# Patient Record
Sex: Female | Born: 1940
Health system: Southern US, Community
[De-identification: ages and names within clinical notes are randomized; demographics above are authoritative.]

## PROBLEM LIST (undated history)

## (undated) DIAGNOSIS — Z992 Dependence on renal dialysis: Secondary | ICD-10-CM

## (undated) DIAGNOSIS — I10 Essential (primary) hypertension: Secondary | ICD-10-CM

## (undated) DIAGNOSIS — R413 Other amnesia: Secondary | ICD-10-CM

## (undated) DIAGNOSIS — N186 End stage renal disease: Secondary | ICD-10-CM

## (undated) DIAGNOSIS — E559 Vitamin D deficiency, unspecified: Secondary | ICD-10-CM

## (undated) DIAGNOSIS — N951 Menopausal and female climacteric states: Secondary | ICD-10-CM

## (undated) DIAGNOSIS — M899 Disorder of bone, unspecified: Secondary | ICD-10-CM

## (undated) DIAGNOSIS — R51 Headache: Secondary | ICD-10-CM

## (undated) DIAGNOSIS — M949 Disorder of cartilage, unspecified: Secondary | ICD-10-CM

## (undated) DIAGNOSIS — E785 Hyperlipidemia, unspecified: Secondary | ICD-10-CM

## (undated) DIAGNOSIS — F329 Major depressive disorder, single episode, unspecified: Secondary | ICD-10-CM

## (undated) DIAGNOSIS — J811 Chronic pulmonary edema: Secondary | ICD-10-CM

## (undated) DIAGNOSIS — K219 Gastro-esophageal reflux disease without esophagitis: Secondary | ICD-10-CM

## (undated) DIAGNOSIS — H409 Unspecified glaucoma: Secondary | ICD-10-CM

## (undated) DIAGNOSIS — F3289 Other specified depressive episodes: Secondary | ICD-10-CM

## (undated) DIAGNOSIS — E109 Type 1 diabetes mellitus without complications: Secondary | ICD-10-CM

## (undated) DIAGNOSIS — B181 Chronic viral hepatitis B without delta-agent: Secondary | ICD-10-CM

## (undated) HISTORY — DX: Gastro-esophageal reflux disease without esophagitis: K21.9

## (undated) HISTORY — DX: Disorder of cartilage, unspecified: M94.9

## (undated) HISTORY — DX: Chronic viral hepatitis B without delta-agent: B18.1

## (undated) HISTORY — DX: Menopausal and female climacteric states: N95.1

## (undated) HISTORY — PX: TONSILLECTOMY: SHX5217

## (undated) HISTORY — DX: Headache: R51

## (undated) HISTORY — DX: Other amnesia: R41.3

## (undated) HISTORY — DX: Hyperlipidemia, unspecified: E78.5

## (undated) HISTORY — DX: Unspecified glaucoma: H40.9

## (undated) HISTORY — DX: Type 1 diabetes mellitus without complications: E10.9

## (undated) HISTORY — DX: Essential (primary) hypertension: I10

## (undated) HISTORY — DX: Other specified depressive episodes: F32.89

## (undated) HISTORY — DX: Major depressive disorder, single episode, unspecified: F32.9

## (undated) HISTORY — DX: Disorder of bone, unspecified: M89.9

## (undated) HISTORY — DX: Vitamin D deficiency, unspecified: E55.9

---

## 2003-06-09 ENCOUNTER — Emergency Department (HOSPITAL_COMMUNITY): Admission: EM | Admit: 2003-06-09 | Discharge: 2003-06-09 | Payer: Self-pay | Admitting: Emergency Medicine

## 2003-06-12 ENCOUNTER — Emergency Department (HOSPITAL_COMMUNITY): Admission: EM | Admit: 2003-06-12 | Discharge: 2003-06-12 | Payer: Self-pay | Admitting: Emergency Medicine

## 2006-09-12 ENCOUNTER — Emergency Department (HOSPITAL_COMMUNITY): Admission: EM | Admit: 2006-09-12 | Discharge: 2006-09-12 | Payer: Self-pay | Admitting: Emergency Medicine

## 2006-10-10 ENCOUNTER — Encounter (INDEPENDENT_AMBULATORY_CARE_PROVIDER_SITE_OTHER): Payer: Self-pay | Admitting: *Deleted

## 2006-10-10 ENCOUNTER — Ambulatory Visit: Payer: Self-pay | Admitting: Family Medicine

## 2006-10-10 ENCOUNTER — Other Ambulatory Visit: Admission: RE | Admit: 2006-10-10 | Discharge: 2006-10-10 | Payer: Self-pay | Admitting: Family Medicine

## 2006-10-10 LAB — CONVERTED CEMR LAB
ALT: 64 units/L — ABNORMAL HIGH (ref 0–40)
AST: 74 units/L — ABNORMAL HIGH (ref 0–37)
Basophils Relative: 3.2 % — ABNORMAL HIGH (ref 0.0–1.0)
Calcium: 9.9 mg/dL (ref 8.4–10.5)
Chloride: 106 meq/L (ref 96–112)
Eosinophil percent: 2 % (ref 0.0–5.0)
GFR calc non Af Amer: 77 mL/min
H Pylori IgG: POSITIVE — AB
HCT: 37.3 % (ref 36.0–46.0)
Hgb A1c MFr Bld: 9.8 % — ABNORMAL HIGH (ref 4.6–6.0)
MCV: 94.6 fL (ref 78.0–100.0)
Microalb Creat Ratio: 345.7 mg/g — ABNORMAL HIGH (ref 0.0–30.0)
Monocytes Absolute: 0.4 10*3/uL (ref 0.2–0.7)
RBC: 3.94 M/uL (ref 3.87–5.11)
Sodium: 141 meq/L (ref 135–145)
WBC: 3.8 10*3/uL — ABNORMAL LOW (ref 4.5–10.5)

## 2006-10-19 ENCOUNTER — Ambulatory Visit: Payer: Self-pay | Admitting: Gastroenterology

## 2006-10-19 ENCOUNTER — Ambulatory Visit: Payer: Self-pay | Admitting: Internal Medicine

## 2006-11-01 ENCOUNTER — Ambulatory Visit: Payer: Self-pay | Admitting: Family Medicine

## 2006-11-01 LAB — CONVERTED CEMR LAB
Albumin: 3.5 g/dL (ref 3.5–5.2)
Alkaline Phosphatase: 87 units/L (ref 39–117)
GGT: 621 units/L — ABNORMAL HIGH (ref 7–51)
HCV Ab: NEGATIVE
Hep A IgM: NEGATIVE
Hep B C IgM: NEGATIVE
Total Bilirubin: 0.8 mg/dL (ref 0.3–1.2)

## 2006-11-03 ENCOUNTER — Encounter (INDEPENDENT_AMBULATORY_CARE_PROVIDER_SITE_OTHER): Payer: Self-pay | Admitting: Specialist

## 2006-11-03 ENCOUNTER — Ambulatory Visit: Payer: Self-pay | Admitting: Internal Medicine

## 2006-11-07 ENCOUNTER — Ambulatory Visit: Payer: Self-pay | Admitting: Cardiology

## 2006-11-08 ENCOUNTER — Encounter: Admission: RE | Admit: 2006-11-08 | Discharge: 2006-11-08 | Payer: Self-pay | Admitting: Family Medicine

## 2007-01-02 ENCOUNTER — Ambulatory Visit: Payer: Self-pay | Admitting: Internal Medicine

## 2007-01-05 ENCOUNTER — Ambulatory Visit: Payer: Self-pay | Admitting: Internal Medicine

## 2007-01-05 LAB — HM COLONOSCOPY

## 2007-02-12 ENCOUNTER — Encounter (INDEPENDENT_AMBULATORY_CARE_PROVIDER_SITE_OTHER): Payer: Self-pay | Admitting: *Deleted

## 2007-02-12 ENCOUNTER — Ambulatory Visit (HOSPITAL_COMMUNITY): Admission: RE | Admit: 2007-02-12 | Discharge: 2007-02-12 | Payer: Self-pay | Admitting: Surgery

## 2007-02-12 HISTORY — PX: CHOLECYSTECTOMY: SHX55

## 2007-09-13 ENCOUNTER — Ambulatory Visit: Payer: Self-pay | Admitting: Family Medicine

## 2007-09-13 DIAGNOSIS — E1121 Type 2 diabetes mellitus with diabetic nephropathy: Secondary | ICD-10-CM | POA: Insufficient documentation

## 2007-09-13 DIAGNOSIS — I10 Essential (primary) hypertension: Secondary | ICD-10-CM

## 2007-09-14 ENCOUNTER — Ambulatory Visit: Payer: Self-pay | Admitting: Family Medicine

## 2007-09-21 DIAGNOSIS — E785 Hyperlipidemia, unspecified: Secondary | ICD-10-CM | POA: Insufficient documentation

## 2007-10-01 ENCOUNTER — Ambulatory Visit: Payer: Self-pay | Admitting: Family Medicine

## 2007-10-01 DIAGNOSIS — Z862 Personal history of diseases of the blood and blood-forming organs and certain disorders involving the immune mechanism: Secondary | ICD-10-CM

## 2007-10-01 DIAGNOSIS — Z8639 Personal history of other endocrine, nutritional and metabolic disease: Secondary | ICD-10-CM

## 2007-11-05 ENCOUNTER — Telehealth (INDEPENDENT_AMBULATORY_CARE_PROVIDER_SITE_OTHER): Payer: Self-pay | Admitting: *Deleted

## 2007-12-12 ENCOUNTER — Encounter: Admission: RE | Admit: 2007-12-12 | Discharge: 2007-12-12 | Payer: Self-pay | Admitting: Family Medicine

## 2007-12-17 ENCOUNTER — Encounter (INDEPENDENT_AMBULATORY_CARE_PROVIDER_SITE_OTHER): Payer: Self-pay | Admitting: *Deleted

## 2007-12-21 ENCOUNTER — Ambulatory Visit: Payer: Self-pay | Admitting: Family Medicine

## 2007-12-21 ENCOUNTER — Encounter (INDEPENDENT_AMBULATORY_CARE_PROVIDER_SITE_OTHER): Payer: Self-pay | Admitting: *Deleted

## 2007-12-21 DIAGNOSIS — N951 Menopausal and female climacteric states: Secondary | ICD-10-CM | POA: Insufficient documentation

## 2007-12-25 ENCOUNTER — Encounter (INDEPENDENT_AMBULATORY_CARE_PROVIDER_SITE_OTHER): Payer: Self-pay | Admitting: *Deleted

## 2008-01-03 ENCOUNTER — Ambulatory Visit: Payer: Self-pay | Admitting: Family Medicine

## 2008-01-07 ENCOUNTER — Encounter (INDEPENDENT_AMBULATORY_CARE_PROVIDER_SITE_OTHER): Payer: Self-pay | Admitting: *Deleted

## 2008-01-14 ENCOUNTER — Telehealth (INDEPENDENT_AMBULATORY_CARE_PROVIDER_SITE_OTHER): Payer: Self-pay | Admitting: *Deleted

## 2008-01-14 LAB — CONVERTED CEMR LAB
BUN: 10 mg/dL (ref 6–23)
Calcium: 9.3 mg/dL (ref 8.4–10.5)
Chloride: 103 meq/L (ref 96–112)
Cholesterol: 338 mg/dL (ref 0–200)
Creatinine,U: 72.7 mg/dL
Eosinophils Absolute: 0 10*3/uL (ref 0.0–0.6)
Eosinophils Relative: 1.2 % (ref 0.0–5.0)
GFR calc Af Amer: 108 mL/min
GFR calc non Af Amer: 89 mL/min
Glucose, Bld: 187 mg/dL — ABNORMAL HIGH (ref 70–99)
HDL: 64.5 mg/dL (ref >39.0)
Hgb A1c MFr Bld: 10.3 % — ABNORMAL HIGH (ref 4.6–6.0)
MCV: 94 fL (ref 78.0–100.0)
Microalb Creat Ratio: 2403 mg/g — ABNORMAL HIGH (ref 0.0–30.0)
Microalb, Ur: 174.7 mg/dL — ABNORMAL HIGH (ref 0.0–1.9)
Monocytes Relative: 12.7 % — ABNORMAL HIGH (ref 3.0–11.0)
Neutro Abs: 1.4 10*3/uL (ref 1.4–7.7)
Platelets: 101 10*3/uL — ABNORMAL LOW (ref 150–400)
RBC: 3.7 M/uL — ABNORMAL LOW (ref 3.87–5.11)
Triglycerides: 213 mg/dL (ref 0–149)
VLDL: 43 mg/dL — ABNORMAL HIGH (ref 0–40)
WBC: 2.6 10*3/uL — ABNORMAL LOW (ref 4.5–10.5)

## 2008-01-23 ENCOUNTER — Encounter: Payer: Self-pay | Admitting: Family Medicine

## 2008-01-23 ENCOUNTER — Ambulatory Visit: Payer: Self-pay | Admitting: Internal Medicine

## 2008-02-01 ENCOUNTER — Encounter (INDEPENDENT_AMBULATORY_CARE_PROVIDER_SITE_OTHER): Payer: Self-pay | Admitting: *Deleted

## 2008-02-06 ENCOUNTER — Telehealth (INDEPENDENT_AMBULATORY_CARE_PROVIDER_SITE_OTHER): Payer: Self-pay | Admitting: *Deleted

## 2008-02-07 ENCOUNTER — Encounter: Payer: Self-pay | Admitting: Family Medicine

## 2008-02-15 ENCOUNTER — Ambulatory Visit: Payer: Self-pay | Admitting: Family Medicine

## 2008-02-15 DIAGNOSIS — M949 Disorder of cartilage, unspecified: Secondary | ICD-10-CM

## 2008-02-15 DIAGNOSIS — M899 Disorder of bone, unspecified: Secondary | ICD-10-CM | POA: Insufficient documentation

## 2008-04-01 ENCOUNTER — Telehealth: Payer: Self-pay | Admitting: Family Medicine

## 2008-04-01 ENCOUNTER — Emergency Department (HOSPITAL_COMMUNITY): Admission: EM | Admit: 2008-04-01 | Discharge: 2008-04-01 | Payer: Self-pay | Admitting: Emergency Medicine

## 2008-04-25 ENCOUNTER — Telehealth (INDEPENDENT_AMBULATORY_CARE_PROVIDER_SITE_OTHER): Payer: Self-pay | Admitting: *Deleted

## 2008-04-29 ENCOUNTER — Ambulatory Visit: Payer: Self-pay | Admitting: Family Medicine

## 2008-04-29 ENCOUNTER — Telehealth (INDEPENDENT_AMBULATORY_CARE_PROVIDER_SITE_OTHER): Payer: Self-pay | Admitting: *Deleted

## 2008-04-30 ENCOUNTER — Telehealth (INDEPENDENT_AMBULATORY_CARE_PROVIDER_SITE_OTHER): Payer: Self-pay | Admitting: *Deleted

## 2008-05-01 ENCOUNTER — Encounter (INDEPENDENT_AMBULATORY_CARE_PROVIDER_SITE_OTHER): Payer: Self-pay | Admitting: *Deleted

## 2008-05-01 ENCOUNTER — Encounter: Payer: Self-pay | Admitting: Family Medicine

## 2008-05-06 ENCOUNTER — Ambulatory Visit: Payer: Self-pay | Admitting: Family Medicine

## 2008-05-13 ENCOUNTER — Encounter (INDEPENDENT_AMBULATORY_CARE_PROVIDER_SITE_OTHER): Payer: Self-pay | Admitting: *Deleted

## 2008-05-13 LAB — CONVERTED CEMR LAB
Metaneph Total, Ur: 541 ug/24hr (ref 224–832)
Metanephrines, Ur: 135 (ref 90–315)
Normetanephrine, 24H Ur: 406 (ref 122–676)

## 2008-05-22 ENCOUNTER — Telehealth: Payer: Self-pay | Admitting: Family Medicine

## 2008-05-26 ENCOUNTER — Ambulatory Visit: Payer: Self-pay | Admitting: Family Medicine

## 2008-06-02 LAB — CONVERTED CEMR LAB
ALT: 40 units/L — ABNORMAL HIGH (ref 0–35)
Albumin: 3.5 g/dL (ref 3.5–5.2)
BUN: 16 mg/dL (ref 6–23)
Bilirubin, Direct: 0.1 mg/dL (ref 0.0–0.3)
CO2: 32 meq/L (ref 19–32)
Calcium: 9.6 mg/dL (ref 8.4–10.5)
Cholesterol: 280 mg/dL (ref 0–200)
Direct LDL: 175.4 mg/dL
GFR calc Af Amer: 92 mL/min
Glucose, Bld: 127 mg/dL — ABNORMAL HIGH (ref 70–99)
HDL: 73.6 mg/dL (ref >39.0)
Sodium: 141 meq/L (ref 135–145)
Total Protein: 7.5 g/dL (ref 6.0–8.3)
Triglycerides: 112 mg/dL (ref 0–149)

## 2008-06-03 ENCOUNTER — Telehealth (INDEPENDENT_AMBULATORY_CARE_PROVIDER_SITE_OTHER): Payer: Self-pay | Admitting: *Deleted

## 2008-12-12 ENCOUNTER — Encounter: Admission: RE | Admit: 2008-12-12 | Discharge: 2008-12-12 | Payer: Self-pay | Admitting: Family Medicine

## 2008-12-15 ENCOUNTER — Encounter (INDEPENDENT_AMBULATORY_CARE_PROVIDER_SITE_OTHER): Payer: Self-pay | Admitting: *Deleted

## 2009-05-20 ENCOUNTER — Encounter (INDEPENDENT_AMBULATORY_CARE_PROVIDER_SITE_OTHER): Payer: Self-pay | Admitting: *Deleted

## 2009-05-20 ENCOUNTER — Ambulatory Visit: Payer: Self-pay | Admitting: Internal Medicine

## 2009-05-20 DIAGNOSIS — R51 Headache: Secondary | ICD-10-CM

## 2009-05-20 DIAGNOSIS — Z9189 Other specified personal risk factors, not elsewhere classified: Secondary | ICD-10-CM | POA: Insufficient documentation

## 2009-05-20 DIAGNOSIS — F329 Major depressive disorder, single episode, unspecified: Secondary | ICD-10-CM

## 2009-05-20 DIAGNOSIS — H409 Unspecified glaucoma: Secondary | ICD-10-CM

## 2009-05-20 DIAGNOSIS — R519 Headache, unspecified: Secondary | ICD-10-CM | POA: Insufficient documentation

## 2009-05-20 DIAGNOSIS — R011 Cardiac murmur, unspecified: Secondary | ICD-10-CM

## 2009-05-20 LAB — CONVERTED CEMR LAB
ALT: 48 units/L — ABNORMAL HIGH (ref 0–35)
ALT: 38 units/L — ABNORMAL HIGH (ref 0–35)
AST: 61 units/L — ABNORMAL HIGH (ref 0–37)
AST: 53 units/L — ABNORMAL HIGH (ref 0–37)
Alkaline Phosphatase: 105 units/L (ref 39–117)
Bilirubin, Direct: 0.1 mg/dL (ref 0.0–0.3)
CO2: 32 meq/L (ref 19–32)
CO2: 32 meq/L (ref 19–32)
Calcium: 9.5 mg/dL (ref 8.4–10.5)
Chloride: 106 meq/L (ref 96–112)
Cholesterol: 324 mg/dL (ref 0–200)
Creatinine, Ser: 0.7 mg/dL (ref 0.4–1.2)
Creatinine, Ser: 0.7 mg/dL (ref 0.4–1.2)
Creatinine,U: 107 mg/dL
GFR calc non Af Amer: 107.16 mL/min (ref >60)
Glucose, Bld: 147 mg/dL — ABNORMAL HIGH (ref 70–99)
Glucose, Bld: 160 mg/dL — ABNORMAL HIGH (ref 70–99)
Sodium: 142 meq/L (ref 135–145)
Sodium: 141 meq/L (ref 135–145)
Total Bilirubin: 0.6 mg/dL (ref 0.3–1.2)
Total Bilirubin: 0.9 mg/dL (ref 0.3–1.2)
Total CHOL/HDL Ratio: 4

## 2009-05-21 ENCOUNTER — Encounter: Payer: Self-pay | Admitting: Internal Medicine

## 2009-05-21 DIAGNOSIS — E559 Vitamin D deficiency, unspecified: Secondary | ICD-10-CM

## 2009-05-25 ENCOUNTER — Telehealth: Payer: Self-pay | Admitting: Internal Medicine

## 2009-06-17 ENCOUNTER — Ambulatory Visit: Payer: Self-pay | Admitting: Internal Medicine

## 2009-06-17 DIAGNOSIS — K219 Gastro-esophageal reflux disease without esophagitis: Secondary | ICD-10-CM | POA: Insufficient documentation

## 2009-06-17 DIAGNOSIS — B181 Chronic viral hepatitis B without delta-agent: Secondary | ICD-10-CM | POA: Insufficient documentation

## 2009-06-18 LAB — CONVERTED CEMR LAB
HCV Ab: NEGATIVE
Hep B C IgM: NEGATIVE
Hep B Core Total Ab: POSITIVE — AB
Hep B S Ab: POSITIVE — AB

## 2009-06-22 ENCOUNTER — Encounter: Admission: RE | Admit: 2009-06-22 | Discharge: 2009-06-22 | Payer: Self-pay | Admitting: Internal Medicine

## 2009-06-29 ENCOUNTER — Telehealth: Payer: Self-pay | Admitting: Internal Medicine

## 2009-07-29 ENCOUNTER — Encounter: Payer: Self-pay | Admitting: Internal Medicine

## 2009-07-29 HISTORY — PX: REFRACTIVE SURGERY: SHX103

## 2009-08-17 ENCOUNTER — Telehealth: Payer: Self-pay | Admitting: Internal Medicine

## 2009-09-15 ENCOUNTER — Ambulatory Visit: Payer: Self-pay | Admitting: Internal Medicine

## 2009-09-15 LAB — CONVERTED CEMR LAB
Cholesterol: 249 mg/dL — ABNORMAL HIGH (ref 0–200)
Hgb A1c MFr Bld: 6.6 % — ABNORMAL HIGH (ref 4.6–6.5)

## 2009-12-23 ENCOUNTER — Encounter: Admission: RE | Admit: 2009-12-23 | Discharge: 2009-12-23 | Payer: Self-pay | Admitting: Internal Medicine

## 2009-12-23 LAB — HM MAMMOGRAPHY: HM Mammogram: NEGATIVE

## 2010-01-11 ENCOUNTER — Telehealth: Payer: Self-pay | Admitting: Internal Medicine

## 2010-01-12 ENCOUNTER — Ambulatory Visit: Payer: Self-pay | Admitting: Internal Medicine

## 2010-01-12 LAB — CONVERTED CEMR LAB: Hgb A1c MFr Bld: 7.7 % — ABNORMAL HIGH (ref 4.6–6.5)

## 2010-02-03 ENCOUNTER — Encounter: Admission: RE | Admit: 2010-02-03 | Discharge: 2010-02-03 | Payer: Self-pay | Admitting: Internal Medicine

## 2010-04-13 ENCOUNTER — Ambulatory Visit: Payer: Self-pay | Admitting: Internal Medicine

## 2010-04-13 LAB — CONVERTED CEMR LAB: Hgb A1c MFr Bld: 8 % — ABNORMAL HIGH (ref 4.6–6.5)

## 2010-08-13 ENCOUNTER — Ambulatory Visit: Payer: Self-pay | Admitting: Internal Medicine

## 2010-08-16 LAB — CONVERTED CEMR LAB
ALT: 37 units/L — ABNORMAL HIGH (ref 0–35)
AST: 48 units/L — ABNORMAL HIGH (ref 0–37)
Albumin: 3.7 g/dL (ref 3.5–5.2)
Cholesterol: 286 mg/dL — ABNORMAL HIGH (ref 0–200)
Direct LDL: 175.2 mg/dL
Hgb A1c MFr Bld: 7.3 % — ABNORMAL HIGH (ref 4.6–6.5)
Total Bilirubin: 0.5 mg/dL (ref 0.3–1.2)
Total Protein: 7.3 g/dL (ref 6.0–8.3)
Triglycerides: 148 mg/dL (ref 0.0–149.0)

## 2010-08-26 ENCOUNTER — Telehealth: Payer: Self-pay | Admitting: Internal Medicine

## 2010-11-24 ENCOUNTER — Other Ambulatory Visit: Payer: Self-pay | Admitting: Internal Medicine

## 2010-11-24 DIAGNOSIS — Z1239 Encounter for other screening for malignant neoplasm of breast: Secondary | ICD-10-CM

## 2010-11-28 LAB — CONVERTED CEMR LAB
BUN: 17 mg/dL (ref 6–23)
Calcium: 10 mg/dL (ref 8.4–10.5)
Creatinine, Ser: 1 mg/dL (ref 0.4–1.2)
GFR calc Af Amer: 71 mL/min
GFR calc non Af Amer: 59 mL/min
Glucose, Bld: 86 mg/dL (ref 70–99)
Potassium: 4 meq/L (ref 3.5–5.1)

## 2010-12-02 NOTE — Progress Notes (Signed)
Summary: Pharmacy change  Phone Note Call from Patient Call back at Home Phone [phone]   Caller: Patient Summary of Call: Pt called requesting all her Rxs be transferred to [facility] Initial call taken by: Crissie Sickles, Vici,  August 26, 2010 3:16 PM    Prescriptions: VITAMIN D (ERGOCALCIFEROL) 50000 UNIT CAPS (ERGOCALCIFEROL) take 1 q week  #12 x 1   Entered by:   Crissie Sickles, CMA   Authorized by:   Rowe Clack MD   Signed by:   Crissie Sickles, CMA on 08/26/2010   Method used:   Faxed to ...       [facility] (mail-order)             , [place]         Ph: [phone]       Fax: [phone]   RxID:   [id number] LABETALOL HCL 200 MG TABS (LABETALOL HCL) 1 by mouth two times a day  #180 x 1   Entered by:   Crissie Sickles, CMA   Authorized by:   Rowe Clack MD   Signed by:   Crissie Sickles, CMA on 08/26/2010   Method used:   Faxed to ...       [facility] (mail-order)             , [place]         Ph: [phone]       Fax: [phone]   RxID:   [id number] OMEPRAZOLE 20 MG CPDR (OMEPRAZOLE) 1 by mouth two times a day  #180 x 1   Entered by:   Crissie Sickles, CMA   Authorized by:   Rowe Clack MD   Signed by:   Crissie Sickles, CMA on 08/26/2010   Method used:   Faxed to ...       [facility] (mail-order)             , [place]         Ph: [phone]       Fax: [phone]   RxID:   [id number] PRAVASTATIN SODIUM 40 MG TABS (PRAVASTATIN SODIUM) 1 by mouth once daily  #90 x 0   Entered by:   Crissie Sickles, CMA   Authorized by:   Rowe Clack MD   Signed by:   Crissie Sickles, CMA on 08/26/2010   Method used:   Faxed to ...       [facility] (mail-order)             , [place]         Ph: [phone]       Fax: [phone]   RxID:   [id number] HYDROCHLOROTHIAZIDE 25 MG TABS (HYDROCHLOROTHIAZIDE) Take one (1) by  mouth daily  #90 x 1   Entered by:   Crissie Sickles, CMA   Authorized by:   Rowe Clack MD   Signed by:   Crissie Sickles, CMA on 08/26/2010   Method used:   Faxed to ...       [facility] (mail-order)             , [place]         Ph: [phone]       Fax: [phone]   RxID:   [id number] NORVASC 10 MG  TABS (AMLODIPINE BESYLATE) 1 by mouth once daily  #90 x 1   Entered by:   Crissie Sickles, CMA   Authorized by:   Jannifer Rodney  Asa Lente MD   Signed by:   Crissie Sickles, CMA on 08/26/2010   Method used:   Faxed to ...       [facility] (mail-order)             , [place]         Ph: [phone]       Fax: [phone]   RxID:   [id number] NOVOLIN 70/30 70-30 %  SUSP (INSULIN ISOPHANE & REGULAR) 25 units subcutaneously AM and 20 units subcutaneously PM  #81mth x 3   Entered by:   Crissie Sickles, CMA   Authorized by:   Rowe Clack MD   Signed by:   Crissie Sickles, CMA on 08/26/2010   Method used:   Faxed to ...       [facility] (mail-order)             , [place]         Ph: [phone]       Fax: [phone]   RxID:   [id number] LISINOPRIL 40 MG  TABS (LISINOPRIL) 1 by mouth once daily  #90 x 1   Entered by:   Crissie Sickles, CMA   Authorized by:   Rowe Clack MD   Signed by:   Crissie Sickles, CMA on 08/26/2010   Method used:   Faxed to ...       [facility] (mail-order)             , [place]         Ph: [phone]       Fax: [phone]   RxID:   [id number]

## 2010-12-02 NOTE — Progress Notes (Signed)
Summary: HCTZ  Phone Note Refill Request Message from:  Fax from Pharmacy on January 11, 2010 11:39 AM  Refills Requested: Medication #1:  HYDROCHLOROTHIAZIDE 25 MG TABS Take one (1) by mouth daily   Last Refilled: 11/25/2009  Method Requested: Electronic Initial call taken by: Tomma Lightning,  January 11, 2010 11:39 AM    Prescriptions: HYDROCHLOROTHIAZIDE 25 MG TABS (HYDROCHLOROTHIAZIDE) Take one (1) by mouth daily  #30 x 3   Entered by:   Tomma Lightning   Authorized by:   Rowe Clack MD   Signed by:   Tomma Lightning on 01/11/2010   Method used:   [facility]        [facility] [address] [phone]* (retail)       [address]       [place], [place]  [address]       Ph: [phone] or [phone]       Fax: [phone]   RxID:   [id number]

## 2010-12-02 NOTE — Assessment & Plan Note (Signed)
Summary: 3-4 MTH FU---STC   Vital Signs:  Patient profile:   70 year old female Height:      65.5 inches (166.37 cm) Weight:      156.0 pounds (70.91 kg) BMI:     25.66 O2 Sat:      97 % on Room air Temp:     98.5 degrees F (36.94 degrees C) oral Pulse rate:   63 / minute BP sitting:   160 / 60  (left arm) Cuff size:   regular  Vitals Entered By: Tomma Lightning (April 13, 2010 10:27 AM)  O2 Flow:  Room air CC: 3 month follow-up Is Patient Diabetic? No Pain Assessment Patient in pain? no        Primary Care Provider:  Rowe Clack MD  CC:  3 month follow-up.  History of Present Illness: here for 3 mo f/u - needs med refills:  1) dyslipidemia - on zocor and tol well - - no adv SE like GI upset or muscle cramping 100% compliance reported with meds  2) DM2 - taking insulin only once daily at lunch (or after dinner) - denies hypoglycemic symptoms or events -  reports improved dietary discretion  3) HTN -  reports compliance with ongoing medical treatment and no changes in medication dose or frequency. denies adverse side effects related to current therapy. no cough or ankle swelling - no CP or HA   Clinical Review Panels:  Immunizations   Last Flu Vaccine:  Fluvax 3+ (09/15/2009)   Last Pneumovax:  Pneumovax (12/21/2007)  Lipid Management   Cholesterol:  249 (09/15/2009)   LDL (bad choesterol):  DEL (05/26/2008)   HDL (good cholesterol):  77.90 (09/15/2009)  Diabetes Management   HgBA1C:  7.7 (01/12/2010)   Creatinine:  0.7 (05/20/2009)   Last Dilated Eye Exam:  diabetic retinopathy (07/29/2009)   Last Foot Exam:  yes (04/13/2010)   Last Flu Vaccine:  Fluvax 3+ (09/15/2009)   Last Pneumovax:  Pneumovax (12/21/2007)  CBC   WBC:  2.6 (01/03/2008)   RBC:  3.70 (01/03/2008)   Hgb:  11.7 (01/03/2008)   Hct:  34.8 (01/03/2008)   Platelets:  101 (01/03/2008)   MCV  94.0 (01/03/2008)   MCHC  33.8 (01/03/2008)   RDW  13.3 (01/03/2008)   PMN:  51.6  (01/03/2008)   Lymphs:  33.7 (01/03/2008)   Monos:  12.7 (01/03/2008)   Eosinophils:  1.2 (01/03/2008)   Basophil:  0.8 (01/03/2008)  Complete Metabolic Panel   Glucose:  160 (05/20/2009)   Sodium:  141 (05/20/2009)   Potassium:  4.1 (05/20/2009)   Chloride:  104 (05/20/2009)   CO2:  32 (05/20/2009)   BUN:  14 (05/20/2009)   Creatinine:  0.7 (05/20/2009)   Albumin:  3.2 (05/20/2009)   Total Protein:  7.3 (05/20/2009)   Calcium:  9.5 (05/20/2009)   Total Bili:  0.9 (05/20/2009)   Alk Phos:  105 (05/20/2009)   SGPT (ALT):  38 (05/20/2009)   SGOT (AST):  53 (05/20/2009)   Current Medications (verified): 1)  Lisinopril 40 Mg  Tabs (Lisinopril) .Marland Kitchen.. 1 By Mouth Once Daily 2)  Novolin 70/30 70-30 %  Susp (Insulin Isophane & Regular) .... 25 Units Subcutaneously Am and 20 Units Subcutaneously Pm 3)  Norvasc 10 Mg  Tabs (Amlodipine Besylate) .Marland Kitchen.. 1 By Mouth Once Daily 4)  Hydrochlorothiazide 25 Mg Tabs (Hydrochlorothiazide) .... Take One (1) By Mouth Daily 5)  Simvastatin 40 Mg Tabs (Simvastatin) .Marland Kitchen.. 1 By Mouth At Bedtime 6)  Omeprazole  20 Mg Cpdr (Omeprazole) .Marland Kitchen.. 1 By Mouth Two Times A Day 7)  Vitamin D (Ergocalciferol) 50000 Unit Caps (Ergocalciferol) .... Take 1 Q Week 8)  Labetalol Hcl 200 Mg Tabs (Labetalol Hcl) .Marland Kitchen.. 1 By Mouth Two Times A Day  Allergies (verified): No Known Drug Allergies  Past History:  Past Medical History: Diabetes mellitus, type 2 - insulin dep Hypertension Hyperlipidemia Osteopenia Depression abnormal LFTs - neg Hep B/C serology 07/2009  Review of Systems  The patient denies fever, weight loss, chest pain, and syncope.    Physical Exam  General:  alert, well-developed, well-nourished, and cooperative to examination.   coarse body features - hands and face Lungs:  normal respiratory effort, no intercostal retractions or use of accessory muscles; normal breath sounds bilaterally - no crackles and no wheezes.    Heart:  normal rate, regular  rhythm, no murmur, and no rub. BLE without edema.  Psych:  Oriented X3, memory intact for recent and remote, normally interactive, good eye contact, not anxious appearing, not depressed appearing, and not agitated.     Diabetes Management Exam:    Foot Exam (with socks and/or shoes not present):       Sensory-Pinprick/Light touch:          Left medial foot (L-4): normal          Left dorsal foot (L-5): normal          Left lateral foot (S-1): normal          Right medial foot (L-4): normal          Right dorsal foot (L-5): normal          Right lateral foot (S-1): normal       Sensory-Monofilament:          Left foot: normal          Right foot: normal       Inspection:          Left foot: normal          Right foot: normal       Nails:          Left foot: normal          Right foot: normal   Impression & Recommendations:  Problem # 1:  DIABETES MELLITUS, TYPE I (ICD-250.01)  insulin dep since age 21 - doing well - check labs today - no change Her updated medication list for this problem includes:    Lisinopril 40 Mg Tabs (Lisinopril) .Marland Kitchen... 1 by mouth once daily    Novolin 70/30 70-30 % Susp (Insulin isophane & regular) .Marland Kitchen... 25 units subcutaneously am and 20 units subcutaneously pm  Labs Reviewed: Creat: 0.7 (05/20/2009)     Last Eye Exam: diabetic retinopathy (07/29/2009) Reviewed HgBA1c results: 7.7 (01/12/2010)  6.6 (09/15/2009)  Orders: TLB-A1C / Hgb A1C (Glycohemoglobin) (83036-A1C)  Problem # 2:  HYPERLIPIDEMIA (ICD-272.4)  Her updated medication list for this problem includes:    Simvastatin 40 Mg Tabs (Simvastatin) .Marland Kitchen... 1 by mouth at bedtime  Labs Reviewed: SGOT: 53 (05/20/2009)   SGPT: 38 (05/20/2009)   HDL:77.90 (09/15/2009), 78.50 (05/20/2009)  LDL:DEL (05/26/2008), DEL (01/03/2008)  Chol:249 (09/15/2009), 301 (05/20/2009)  Trig:95.0 (09/15/2009), 134.0 (05/20/2009)  Problem # 3:  HYPERTENSION (ICD-401.9)  Her updated medication list for this problem  includes:    Lisinopril 40 Mg Tabs (Lisinopril) .Marland Kitchen... 1 by mouth once daily    Norvasc 10 Mg Tabs (Amlodipine besylate) .Marland Kitchen... 1 by mouth once daily  Hydrochlorothiazide 25 Mg Tabs (Hydrochlorothiazide) .Marland Kitchen... Take one (1) by mouth daily    Labetalol Hcl 200 Mg Tabs (Labetalol hcl) .Marland Kitchen... 1 by mouth two times a day  BP today: 160/60 Prior BP: 142/70 (01/12/2010)  if still elevated next OV, will need adjustment of meds -  Labs Reviewed: K+: 4.1 (05/20/2009) Creat: : 0.7 (05/20/2009)   Chol: 249 (09/15/2009)   HDL: 77.90 (09/15/2009)   LDL: DEL (05/26/2008)   TG: 95.0 (09/15/2009)  Complete Medication List: 1)  Lisinopril 40 Mg Tabs (Lisinopril) .Marland Kitchen.. 1 by mouth once daily 2)  Novolin 70/30 70-30 % Susp (Insulin isophane & regular) .... 25 units subcutaneously am and 20 units subcutaneously pm 3)  Norvasc 10 Mg Tabs (Amlodipine besylate) .Marland Kitchen.. 1 by mouth once daily 4)  Hydrochlorothiazide 25 Mg Tabs (Hydrochlorothiazide) .... Take one (1) by mouth daily 5)  Simvastatin 40 Mg Tabs (Simvastatin) .Marland Kitchen.. 1 by mouth at bedtime 6)  Omeprazole 20 Mg Cpdr (Omeprazole) .Marland Kitchen.. 1 by mouth two times a day 7)  Vitamin D (ergocalciferol) 50000 Unit Caps (Ergocalciferol) .... Take 1 q week 8)  Labetalol Hcl 200 Mg Tabs (Labetalol hcl) .Marland Kitchen.. 1 by mouth two times a day  Patient Instructions: 1)  it was good to see you today. 2)  test(s) ordered today - your results will be posted on the phone tree for review in 48-72 hours from the time of test completion; call [phone] and enter your 9 digit MRN (listed above on this page, just below your name); if any changes need to be made or there are abnormal results, you will be contacted directly.  3)  continue to take insulin 25 units AM and 20 units PM - also continue watch your diet and exercise 4)  Please schedule a follow-up appointment in 3-4 months, sooner if problems.

## 2010-12-02 NOTE — Assessment & Plan Note (Signed)
Summary: 3-4 MO ROV /NWS   Vital Signs:  Patient profile:   70 year old female Height:      65.5 inches (166.37 cm) Weight:      160.0 pounds (72.73 kg) O2 Sat:      98 % on Room air Temp:     97.5 degrees F (36.39 degrees C) oral Pulse rate:   67 / minute BP sitting:   142 / 70  (left arm) Cuff size:   regular  Vitals Entered By: Tomma Lightning (January 12, 2010 10:40 AM)  O2 Flow:  Room air CC: 3 month follow-up Is Patient Diabetic? Yes Did you bring your meter with you today? No Pain Assessment Patient in pain? no        Primary Care Provider:  Rowe Clack MD  CC:  3 month follow-up.  History of Present Illness: here for 3 mo f/u - needs med refills:  1) dyslipidemia - on zocor and tol well - - no adv SE like GI upset or muscle cramping 100% compliance reported with meds  2) DM - taking insulin only once daily at lunch (or after dinner) - denies hypoglycemic symptoms or events -  reports poor dietary discretion "i haven't been good"  3) HTN -  reports compliance with ongoing medical treatment and no changes in medication dose or frequency. denies adverse side effects related to current therapy. no cough or ankle swelling  4) c/o need for pelvic check and PAP because "it just needs to be checked" - denies vag discharge or pain   Clinical Review Panels:  Prevention   Last Mammogram:  ASSESSMENT: Negative - BI-RADS 1^MM DIGITAL SCREENING (12/23/2009)   Last Pap Smear:  Normal (06-25-2006)   Last Colonoscopy:  Location:  [facility].  Findings:1.  No poylps or cancer seen, 2. Diverticulosis and hemorrhoids, 3. Melanosis coli (01/05/2007)  Immunizations   Last Flu Vaccine:  Fluvax 3+ (09/15/2009)   Last Pneumovax:  Pneumovax (12/21/2007)  Lipid Management   Cholesterol:  249 (09/15/2009)   LDL (bad choesterol):  DEL (05/26/2008)   HDL (good cholesterol):  77.90 (09/15/2009)  Diabetes Management   HgBA1C:  6.6 (09/15/2009)   Creatinine:   0.7 (05/20/2009)   Last Dilated Eye Exam:  diabetic retinopathy (07/29/2009)   Last Foot Exam:  yes (05/26/2008)   Last Flu Vaccine:  Fluvax 3+ (09/15/2009)   Last Pneumovax:  Pneumovax (12/21/2007)  CBC   WBC:  2.6 (01/03/2008)   RBC:  3.70 (01/03/2008)   Hgb:  11.7 (01/03/2008)   Hct:  34.8 (01/03/2008)   Platelets:  101 (01/03/2008)   MCV  94.0 (01/03/2008)   MCHC  33.8 (01/03/2008)   RDW  13.3 (01/03/2008)   PMN:  51.6 (01/03/2008)   Lymphs:  33.7 (01/03/2008)   Monos:  12.7 (01/03/2008)   Eosinophils:  1.2 (01/03/2008)   Basophil:  0.8 (01/03/2008)  Complete Metabolic Panel   Glucose:  160 (05/20/2009)   Sodium:  141 (05/20/2009)   Potassium:  4.1 (05/20/2009)   Chloride:  104 (05/20/2009)   CO2:  32 (05/20/2009)   BUN:  14 (05/20/2009)   Creatinine:  0.7 (05/20/2009)   Albumin:  3.2 (05/20/2009)   Total Protein:  7.3 (05/20/2009)   Calcium:  9.5 (05/20/2009)   Total Bili:  0.9 (05/20/2009)   Alk Phos:  105 (05/20/2009)   SGPT (ALT):  38 (05/20/2009)   SGOT (AST):  53 (05/20/2009)   Current Medications (verified): 1)  Lisinopril 40 Mg  Tabs (  Lisinopril) .Marland Kitchen.. 1 By Mouth Once Daily 2)  Novolin 70/30 70-30 %  Susp (Insulin Isophane & Regular) .... 40 U Subcutaneously Before Lunch 3)  Norvasc 10 Mg  Tabs (Amlodipine Besylate) .Marland Kitchen.. 1 By Mouth Once Daily 4)  Hydrochlorothiazide 25 Mg Tabs (Hydrochlorothiazide) .... Take One (1) By Mouth Daily 5)  Simvastatin 40 Mg Tabs (Simvastatin) .Marland Kitchen.. 1 By Mouth At Bedtime 6)  Omeprazole 20 Mg Cpdr (Omeprazole) .Marland Kitchen.. 1 By Mouth Two Times A Day 7)  Vitamin D (Ergocalciferol) 50000 Unit Caps (Ergocalciferol) .... Take 1 Q Week 8)  Labetalol Hcl 200 Mg Tabs (Labetalol Hcl) .Marland Kitchen.. 1 By Mouth Two Times A Day  Allergies (verified): No Known Drug Allergies  Past History:  Past Medical History: Diabetes mellitus, type I Hypertension Hyperlipidemia Osteopenia Depression abnormal LFTs - neg Hep B/C serology 07/2009  Review of  Systems  The patient denies fever, weight loss, chest pain, headaches, and abdominal pain.    Physical Exam  General:  alert, well-developed, well-nourished, and cooperative to examination.   coarse body features - hands and face Lungs:  normal respiratory effort, no intercostal retractions or use of accessory muscles; normal breath sounds bilaterally - no crackles and no wheezes.    Heart:  normal rate, regular rhythm, no murmur, and no rub. BLE without edema.    Impression & Recommendations:  Problem # 1:  DIABETES MELLITUS, TYPE I (ICD-250.01)  Her updated medication list for this problem includes:    Lisinopril 40 Mg Tabs (Lisinopril) .Marland Kitchen... 1 by mouth once daily    Novolin 70/30 70-30 % Susp (Insulin isophane & regular) .Marland Kitchen... 25 units subcutaneously am and 20 units subcutaneously pm  Labs Reviewed: Creat: 0.7 (05/20/2009)     Last Eye Exam: diabetic retinopathy (07/29/2009) Reviewed HgBA1c results: 6.6 (09/15/2009)  6.7 (05/20/2009)  Orders: TLB-A1C / Hgb A1C (Glycohemoglobin) (83036-A1C) Prescription Created Electronically [phone])  Problem # 2:  HYPERLIPIDEMIA (ICD-272.4)  Her updated medication list for this problem includes:    Simvastatin 40 Mg Tabs (Simvastatin) .Marland Kitchen... 1 by mouth at bedtime  Labs Reviewed: SGOT: 53 (05/20/2009)   SGPT: 38 (05/20/2009)   HDL:77.90 (09/15/2009), 78.50 (05/20/2009)  LDL:DEL (05/26/2008), DEL (01/03/2008)  Chol:249 (09/15/2009), 301 (05/20/2009)  Trig:95.0 (09/15/2009), 134.0 (05/20/2009)  Problem # 3:  HYPERTENSION (ICD-401.9)  has run out of HCTZ and labetolol -  encouraged to resume now and call as needed for refills  Her updated medication list for this problem includes:    Lisinopril 40 Mg Tabs (Lisinopril) .Marland Kitchen... 1 by mouth once daily    Norvasc 10 Mg Tabs (Amlodipine besylate) .Marland Kitchen... 1 by mouth once daily    Hydrochlorothiazide 25 Mg Tabs (Hydrochlorothiazide) .Marland Kitchen... Take one (1) by mouth daily    Labetalol Hcl 200 Mg Tabs  (Labetalol hcl) .Marland Kitchen... 1 by mouth two times a day  BP today: 142/70 Prior BP: 168/72 (09/15/2009)  Labs Reviewed: K+: 4.1 (05/20/2009) Creat: : 0.7 (05/20/2009)   Chol: 249 (09/15/2009)   HDL: 77.90 (09/15/2009)   LDL: DEL (05/26/2008)   TG: 95.0 (09/15/2009)  Problem # 4:  TRANSAMINASES, SERUM, ELEVATED (ICD-790.4) abn [place] 07/2009 - probable hemangioma... due for recheck now - will order Orders: Radiology Referral (Radiology)  Complete Medication List: 1)  Lisinopril 40 Mg Tabs (Lisinopril) .Marland Kitchen.. 1 by mouth once daily 2)  Novolin 70/30 70-30 % Susp (Insulin isophane & regular) .... 25 units subcutaneously am and 20 units subcutaneously pm 3)  Norvasc 10 Mg Tabs (Amlodipine besylate) .Marland Kitchen.. 1 by mouth once daily 4)  Hydrochlorothiazide 25 Mg Tabs (Hydrochlorothiazide) .... Take one (1) by mouth daily 5)  Simvastatin 40 Mg Tabs (Simvastatin) .Marland Kitchen.. 1 by mouth at bedtime 6)  Omeprazole 20 Mg Cpdr (Omeprazole) .Marland Kitchen.. 1 by mouth two times a day 7)  Vitamin D (ergocalciferol) 50000 Unit Caps (Ergocalciferol) .... Take 1 q week 8)  Labetalol Hcl 200 Mg Tabs (Labetalol hcl) .Marland Kitchen.. 1 by mouth two times a day  Other Orders: Gynecologic Referral (Gyn)  Patient Instructions: 1)  it was good to see you today. 2)  test(s) ordered today - your results will be posted on the phone tree for review in 48-72 hours from the time of test completion; call [phone] and enter your 9 digit MRN (listed above on this page, just below your name); if any changes need to be made or there are abnormal results, you will be contacted directly.  3)  change your insulin to 25 ints AM and 20units PM - also watch your diet and eat better! (low sugar, low fat) 4)  we'll make referral to gynecology. Our office will contact you regarding this appointment once made.  5)  also will schedule followup ultrasound to look at your liver - Our office will contact you regarding this appointment once made.  6)  Please schedule a follow-up  appointment in 3-4 months, sooner if problems.  Prescriptions: LABETALOL HCL 200 MG TABS (LABETALOL HCL) 1 by mouth two times a day  #60 x 5   Entered and Authorized by:   Rowe Clack MD   Signed by:   Rowe Clack MD on 01/12/2010   Method used:   [facility]        [facility] [address] [phone]* (retail)       [address]       [place], [place]  [address]       Ph: [phone] or [phone]       Fax: [phone]   RxID:   [id number] VITAMIN D (ERGOCALCIFEROL) 50000 UNIT CAPS (ERGOCALCIFEROL) take 1 q week  #8 x 5   Entered and Authorized by:   Rowe Clack MD   Signed by:   Rowe Clack MD on 01/12/2010   Method used:   Electronically to        [facility] [address] [phone]* (retail)       [address]       [place], [place]  [address]       Ph: [phone] or [phone]       Fax: [phone]   RxID:   [id number] OMEPRAZOLE 20 MG CPDR (OMEPRAZOLE) 1 by mouth two times a day  #60 x 5   Entered and Authorized by:   Rowe Clack MD   Signed by:   Rowe Clack MD on 01/12/2010   Method used:   Electronically to        [facility] [address] [phone]* (retail)       [address]       [place], [place]  [address]       Ph: [phone] or [phone]       Fax: [phone]   RxID:   [id number] SIMVASTATIN 40 MG TABS (SIMVASTATIN) 1 by mouth at bedtime  #30 x 5   Entered and Authorized by:   Rowe Clack MD  Signed by:   Rowe Clack MD on 01/12/2010   Method used:   Electronically to        [facility] [address] [phone]* (retail)       [address]       [place], [place]  [address]       Ph: [phone] or [phone]       Fax: [phone]   RxID:   [id number] HYDROCHLOROTHIAZIDE 25 MG TABS (HYDROCHLOROTHIAZIDE) Take one (1) by mouth daily  #30 x 5   Entered and Authorized by:   Rowe Clack MD    Signed by:   Rowe Clack MD on 01/12/2010   Method used:   Electronically to        [facility] [address] [phone]* (retail)       [address]       [place], [place]  [address]       Ph: [phone] or [phone]       Fax: [phone]   RxID:   [id number] NORVASC 10 MG  TABS (AMLODIPINE BESYLATE) 1 by mouth once daily  #30 x 5   Entered and Authorized by:   Rowe Clack MD   Signed by:   Rowe Clack MD on 01/12/2010   Method used:   Electronically to        [facility] [address] [phone]* (retail)       [address]       [place], [place]  [address]       Ph: [phone] or [phone]       Fax: [phone]   RxID:   [id number] LISINOPRIL 40 MG  TABS (LISINOPRIL) 1 by mouth once daily  #30 x 5   Entered and Authorized by:   Rowe Clack MD   Signed by:   Rowe Clack MD on 01/12/2010   Method used:   Electronically to        [facility] [address] [phone]* (retail)       [address]       [place], [place]  [address]       Ph: [phone] or [phone]       Fax: [phone]   RxID:   [id number] NOVOLIN 70/30 70-30 %  SUSP (INSULIN ISOPHANE & REGULAR) 25 units subcutaneously AM and 20 units subcutaneously PM  #1 mo x 11   Entered and Authorized by:   Rowe Clack MD   Signed by:   Rowe Clack MD on 01/12/2010   Method used:   Electronically to        [facility] [address] [phone]* (retail)       [place] [address]       [place], [place]  [address]       Ph: [phone] or [phone]       Fax: [phone]   RxID:   [id number]

## 2010-12-02 NOTE — Assessment & Plan Note (Signed)
Summary: 3-4 mth fu  stc   Vital Signs:  Patient profile:   70 year old female Height:      65.5 inches (166.37 cm) Weight:      153.0 pounds (69.55 kg) O2 Sat:      98 % on Room air Temp:     98.4 degrees F (36.89 degrees C) oral Pulse rate:   72 / minute BP sitting:   148 / 60  (left arm) Cuff size:   regular  Vitals Entered By: Tomma Lightning RMA (August 13, 2010 10:30 AM)  O2 Flow:  Room air CC: 3 month follow-up Is Patient Diabetic? Yes Did you bring your meter with you today? No Pain Assessment Patient in pain? no        Primary Care Provider:  Rowe Clack MD  CC:  3 month follow-up.  History of Present Illness: here for 3 mo f/u - needs med refills:  1) dyslipidemia - on zocor and tol well - - no adv SE like GI upset or muscle cramping 100% compliance reported with meds  2) DM2 - still taking insulin only once daily at lunch (or after dinner) - denies hypoglycemic symptoms or events -  reports improved dietary discretion  3) HTN -  reports compliance with ongoing medical treatment and no changes in medication dose or frequency. denies adverse side effects related to current therapy. no cough or ankle swelling - no CP or HA   Clinical Review Panels:  Lipid Management   Cholesterol:  249 (09/15/2009)   LDL (bad choesterol):  DEL (05/26/2008)   HDL (good cholesterol):  77.90 (09/15/2009)  Diabetes Management   HgBA1C:  8.0 (04/13/2010)   Creatinine:  0.7 (05/20/2009)   Last Dilated Eye Exam:  diabetic retinopathy (07/29/2009)   Last Foot Exam:  yes (08/13/2010)   Last Flu Vaccine:  Fluvax 3+ (09/15/2009)   Last Pneumovax:  Pneumovax (12/21/2007)   Current Medications (verified): 1)  Lisinopril 40 Mg  Tabs (Lisinopril) .Marland Kitchen.. 1 By Mouth Once Daily 2)  Novolin 70/30 70-30 %  Susp (Insulin Isophane & Regular) .... 25 Units Subcutaneously Am and 20 Units Subcutaneously Pm 3)  Norvasc 10 Mg  Tabs (Amlodipine Besylate) .Marland Kitchen.. 1 By Mouth Once Daily 4)   Hydrochlorothiazide 25 Mg Tabs (Hydrochlorothiazide) .... Take One (1) By Mouth Daily 5)  Simvastatin 40 Mg Tabs (Simvastatin) .Marland Kitchen.. 1 By Mouth At Bedtime 6)  Omeprazole 20 Mg Cpdr (Omeprazole) .Marland Kitchen.. 1 By Mouth Two Times A Day 7)  Vitamin D (Ergocalciferol) 50000 Unit Caps (Ergocalciferol) .... Take 1 Q Week 8)  Labetalol Hcl 200 Mg Tabs (Labetalol Hcl) .Marland Kitchen.. 1 By Mouth Two Times A Day  Allergies (verified): No Known Drug Allergies  Past History:  Past Medical History: Diabetes mellitus, type 2 - insulin dep Hypertension Hyperlipidemia Osteopenia Depression abnormal LFTs - neg Hep B/C serology 07/2009  Review of Systems  The patient denies fever, weight gain, chest pain, syncope, headaches, and abdominal pain.    Physical Exam  General:  alert, well-developed, well-nourished, and cooperative to examination.   coarse body features - hands and face Lungs:  normal respiratory effort, no intercostal retractions or use of accessory muscles; normal breath sounds bilaterally - no crackles and no wheezes.    Heart:  normal rate, regular rhythm, no murmur, and no rub. BLE without edema.   Diabetes Management Exam:    Foot Exam (with socks and/or shoes not present):       Sensory-Pinprick/Light touch:  Left medial foot (L-4): normal          Left dorsal foot (L-5): normal          Left lateral foot (S-1): normal          Right medial foot (L-4): normal          Right dorsal foot (L-5): normal          Right lateral foot (S-1): normal       Sensory-Monofilament:          Left foot: normal          Right foot: normal       Inspection:          Left foot: normal          Right foot: normal       Nails:          Left foot: normal          Right foot: normal   Impression & Recommendations:  Problem # 1:  DIABETES MELLITUS, TYPE I (ICD-250.01)  Her updated medication list for this problem includes:    Lisinopril 40 Mg Tabs (Lisinopril) .Marland Kitchen... 1 by mouth once daily     Novolin 70/30 70-30 % Susp (Insulin isophane & regular) .Marland Kitchen... 25 units subcutaneously am and 20 units subcutaneously pm  Orders: TLB-A1C / Hgb A1C (Glycohemoglobin) (83036-A1C)  insulin dep since age 31 - doing well -  check labs today -  no change but reminded of need for med compliance as rx'd  Labs Reviewed: Creat: 0.7 (05/20/2009)     Last Eye Exam: diabetic retinopathy (07/29/2009) Reviewed HgBA1c results: 8.0 (04/13/2010)  7.7 (01/12/2010)  Problem # 2:  HYPERLIPIDEMIA (ICD-272.4) change simva to prava due to interaction with amlodipine Her updated medication list for this problem includes:    Pravastatin Sodium 40 Mg Tabs (Pravastatin sodium) .Marland Kitchen... 1 by mouth at bedtime  Orders: TLB-Lipid Panel (80061-LIPID)  Labs Reviewed: SGOT: 53 (05/20/2009)   SGPT: 38 (05/20/2009)   HDL:77.90 (09/15/2009), 78.50 (05/20/2009)  LDL:DEL (05/26/2008), DEL (01/03/2008)  Chol:249 (09/15/2009), 301 (05/20/2009)  Trig:95.0 (09/15/2009), 134.0 (05/20/2009)  Problem # 3:  TRANSAMINASES, SERUM, ELEVATED (ICD-790.4)  Orders: TLB-Hepatic/Liver Function Pnl (80076-HEPATIC)  abn [place] 07/2009 - probable hemangioma...  no change on followup US 01/2010 hep b.c serologies neg 2010 due for recheck labs now -  Problem # 4:  HYPERTENSION (ICD-401.9)  Her updated medication list for this problem includes:    Lisinopril 40 Mg Tabs (Lisinopril) .Marland Kitchen... 1 by mouth once daily    Norvasc 10 Mg Tabs (Amlodipine besylate) .Marland Kitchen... 1 by mouth once daily    Hydrochlorothiazide 25 Mg Tabs (Hydrochlorothiazide) .Marland Kitchen... Take one (1) by mouth daily    Labetalol Hcl 200 Mg Tabs (Labetalol hcl) .Marland Kitchen... 1 by mouth two times a day  BP today: 148/60 Prior BP: 160/60 (04/13/2010)  Labs Reviewed: K+: 4.1 (05/20/2009) Creat: : 0.7 (05/20/2009)   Chol: 249 (09/15/2009)   HDL: 77.90 (09/15/2009)   LDL: DEL (05/26/2008)   TG: 95.0 (09/15/2009)  Complete Medication List: 1)  Lisinopril 40 Mg Tabs (Lisinopril) .Marland Kitchen.. 1 by mouth  once daily 2)  Novolin 70/30 70-30 % Susp (Insulin isophane & regular) .... 25 units subcutaneously am and 20 units subcutaneously pm 3)  Norvasc 10 Mg Tabs (Amlodipine besylate) .Marland Kitchen.. 1 by mouth once daily 4)  Hydrochlorothiazide 25 Mg Tabs (Hydrochlorothiazide) .... Take one (1) by mouth daily 5)  Pravastatin Sodium 40 Mg Tabs (Pravastatin sodium) .Marland Kitchen.. 1 by  mouth at bedtime 6)  Omeprazole 20 Mg Cpdr (Omeprazole) .Marland Kitchen.. 1 by mouth two times a day 7)  Vitamin D (ergocalciferol) 50000 Unit Caps (Ergocalciferol) .... Take 1 q week 8)  Labetalol Hcl 200 Mg Tabs (Labetalol hcl) .Marland Kitchen.. 1 by mouth two times a day  Patient Instructions: 1)  it was good to see you today. 2)  change cholesterol medication from simvastatin to pravastatin + refills on all medications - your prescriptions have been electronically submitted to your pharmacy. Please take as directed. Contact our office if you believe you're having problems with the medication(s).  3)  test(s) ordered today - your results will be posted on the phone tree for review in 48-72 hours from the time of test completion; call [phone] and enter your 9 digit MRN (listed above on this page, just below your name); if any changes need to be made or there are abnormal results, you will be contacted directly.  4)  continue to take insulin 25 units AM and 20 units PM - also continue watch your diet and exercise 5)  Please schedule a follow-up appointment in 3-4 months, sooner if problems.  Prescriptions: PRAVASTATIN SODIUM 40 MG TABS (PRAVASTATIN SODIUM) 1 by mouth at bedtime  #30 x 6   Entered and Authorized by:   Rowe Clack MD   Signed by:   Rowe Clack MD on 08/13/2010   Method used:   Electron[facility] to        [facility] [address] [phone]* (retail)       [address]       [place], [place]  [address]       Ph: [phone] or [phone]       Fax: [phone]   RxID:   [id number] LABETALOL HCL 200 MG TABS  (LABETALOL HCL) 1 by mouth two times a day  #60 x 6   Entered and Authorized by:   Rowe Clack MD   Signed by:   Rowe Clack MD on 08/13/2010   Method used:   [facility]ronically to        [facility] [address] [phone]* (retail)       [address]       [place], [place]  [address]       Ph: [phone] or [phone]       Fax: [phone]   RxID:   [id number] OMEPRAZOLE 20 MG CPDR (OMEPRAZOLE) 1 by mouth two times a day  #60 x 6   Entered and Authorized by:   Rowe Clack MD   Signed by:   Rowe Clack MD on 08/13/2010   Method used:   [facility]        [facility] [address] [phone]* (retail)       [address]       [place], [place]  [address]       Ph: [phone] or [phone]       Fax: [phone]   RxID:   [id number] HYDROCHLOROTHIAZIDE 25 MG TABS (HYDROCHLOROTHIAZIDE) Take one (1) by mouth daily  #30 Tablet x 6   Entered and Authorized by:   Rowe Clack MD   Signed by:   Rowe Clack MD on 08/13/2010   Method used:   Electronically to        [facility] [phone]* (retail)       [place] [address]       [place], [place]  [address]       Ph: [phone] or [phone]       Fax: [phone]   RxID:   [id number] NORVASC 10 MG  TABS (AMLODIPINE BESYLATE) 1 by mouth once daily  #30 Tablet x 6   Entered and Authorized by:   Rowe Clack MD   Signed by:   Rowe Clack MD on 08/13/2010   Method used:   [facility] to        [facility] [address] [phone]* (retail)       [address]       [place], [place]  [address]       Ph: [phone] or [phone]       Fax: [phone]   RxID:   [id number] NOVOLIN 70/30 70-30 %  SUSP (INSULIN ISOPHANE & REGULAR) 25 units subcutaneously AM and 20 units subcutaneously PM  #1 mo x 11   Entered and Authorized by:   Rowe Clack MD   Signed by:   Rowe Clack MD on 08/13/2010   Method used:   [facility]ronically to        [facility] [address] [phone]* (retail)       [place] [address]       [place], [place]  [address]       Ph: [phone] or [phone]       Fax: [phone]   RxID:   [id number] LISINOPRIL 40 MG  TABS (LISINOPRIL) 1 by mouth once daily  #30 Tablet x 6   Entered and Authorized by:   Rowe Clack MD   Signed by:   Rowe Clack MD on 08/13/2010   Method used:   [facility]ron[facility] to        [facility] [address] [phone]* (retail)       [address]       [place], [place]  [address]       Ph: [phone] or [phone]       Fax: [phone]   RxID:   [id number]   Appended Document: 3-4 mth fu  stc     Clinical Lists Changes  Orders: Added new Service order of Flu Vaccine 46yrs + MEDICARE PATIENTS [id number]) - Signed Added new Service order of Administration Flu vaccine - MCR [id number]) - Signed Observations: Added new observation of FLU VAX VIS: 05/25/2010 version (08/13/2010 11:02) Added new observation of FLU VAXLOT: [id number] (08/13/2010 11:02) Added new observation of FLU VAXMFR: Glaxosmithkline (08/13/2010 11:02) Added new observation of FLU VAX EXP: 04/30/2011 (08/13/2010 11:02) Added new observation of FLU VAX DSE: 0.80ml (08/13/2010 11:02) Added new observation of FLU VAX: Fluvax 3+ (08/13/2010 11:02)Flu Vaccine Consent Questions     Do you have a history of severe allergic reactions to this vaccine? no    Any prior history of allergic reactions to egg and/or gelatin? no    Do you have a sensitivity to the preservative Thimersol? no    Do you have a past history of Guillan-Barre Syndrome? no    Do you currently have an acute febrile illness? no    Have you ever had a severe reaction to latex? no    Vaccine information given and  explained to patient? yes    Are you currently pregnant? no    Lot Number:[id number]   Exp Date:04/30/2011   Site Given   Right Deltoid IMbservation of FLU VAX DSE: 0.29ml (08/13/2010 11:02) Added new observation of FLU VAX: Fluvax 3+ (08/13/2010 11:02)     .lbmedflu

## 2010-12-17 ENCOUNTER — Other Ambulatory Visit: Payer: Self-pay | Admitting: Internal Medicine

## 2010-12-17 ENCOUNTER — Ambulatory Visit (INDEPENDENT_AMBULATORY_CARE_PROVIDER_SITE_OTHER): Payer: Medicare PPO | Admitting: Internal Medicine

## 2010-12-17 ENCOUNTER — Encounter: Payer: Self-pay | Admitting: Internal Medicine

## 2010-12-17 ENCOUNTER — Other Ambulatory Visit: Payer: Medicare PPO

## 2010-12-17 ENCOUNTER — Telehealth: Payer: Self-pay | Admitting: Internal Medicine

## 2010-12-17 DIAGNOSIS — I10 Essential (primary) hypertension: Secondary | ICD-10-CM

## 2010-12-17 DIAGNOSIS — Z862 Personal history of diseases of the blood and blood-forming organs and certain disorders involving the immune mechanism: Secondary | ICD-10-CM

## 2010-12-17 DIAGNOSIS — E109 Type 1 diabetes mellitus without complications: Secondary | ICD-10-CM

## 2010-12-17 DIAGNOSIS — E785 Hyperlipidemia, unspecified: Secondary | ICD-10-CM

## 2010-12-17 DIAGNOSIS — Z79899 Other long term (current) drug therapy: Secondary | ICD-10-CM

## 2010-12-17 DIAGNOSIS — Z8639 Personal history of other endocrine, nutritional and metabolic disease: Secondary | ICD-10-CM

## 2010-12-17 LAB — LIPID PANEL: HDL: 84.1 mg/dL (ref >39.00)

## 2010-12-17 LAB — HEPATIC FUNCTION PANEL
ALT: 36 U/L — ABNORMAL HIGH (ref 0–35)
Alkaline Phosphatase: 83 U/L (ref 39–117)
Bilirubin, Direct: 0.1 mg/dL (ref 0.0–0.3)
Total Bilirubin: 0.4 mg/dL (ref 0.3–1.2)

## 2010-12-17 LAB — BASIC METABOLIC PANEL
CO2: 32 mEq/L (ref 19–32)
Calcium: 9.2 mg/dL (ref 8.4–10.5)
Creatinine, Ser: 0.9 mg/dL (ref 0.4–1.2)
GFR: 84.11 mL/min (ref >60.00)
Sodium: 139 mEq/L (ref 135–145)

## 2010-12-17 LAB — LDL CHOLESTEROL, DIRECT: Direct LDL: 138.9 mg/dL

## 2010-12-22 NOTE — Assessment & Plan Note (Signed)
Summary: FU STC   Vital Signs:  Patient profile:   70 year old female Height:      65.5 inches (166.37 cm) Weight:      160.8 pounds (73.09 kg) O2 Sat:      97 % on Room air Temp:     98.6 degrees F (37.00 degrees C) oral Pulse rate:   73 / minute BP sitting:   160 / 62  (left arm) Cuff size:   large  Vitals Entered By: Tomma Lightning RMA (December 17, 2010 10:48 AM)  O2 Flow:  Room air CC: follow-up visit Is Patient Diabetic? No Pain Assessment Patient in pain? no        Primary Care Provider:  Rowe Clack MD  CC:  follow-up visit.  History of Present Illness: here for f/u - needs med refills:  1) dyslipidemia - reinforced need for compliance with  pravachol 07/2010 due to terible lipids no adv SE like GI upset or muscle cramping declines lipitor or zocor due to concern for poss SE now 100% compliance reported with meds  2) DM2 - still taking insulin only once daily at lunch (or after dinner) - denies hypoglycemic symptoms or events -  reports improved dietary discretion  3) HTN -  reports compliance with ongoing medical treatment and no changes in medication dose or frequency. denies adverse side effects related to current therapy. no cough or ankle swelling - no CP or HA   Clinical Review Panels:  Prevention   Last Mammogram:  ASSESSMENT: Negative - BI-RADS 1^MM DIGITAL SCREENING (12/23/2009)   Last Pap Smear:  Normal (11-Dec-202007)   Last Colonoscopy:  Location:  [facility].  Findings:1.  No poylps or cancer seen, 2. Diverticulosis and hemorrhoids, 3. Melanosis coli (01/05/2007)  Lipid Management   Cholesterol:  286 (08/13/2010)   LDL (bad choesterol):  DEL (05/26/2008)   HDL (good cholesterol):  76.40 (08/13/2010)  Diabetes Management   HgBA1C:  7.3 (08/13/2010)   Creatinine:  0.7 (05/20/2009)   Last Dilated Eye Exam:  diabetic retinopathy (07/29/2009)   Last Foot Exam:  yes (08/13/2010)   Last Flu Vaccine:  Fluvax 3+ (08/13/2010)   Last Pneumovax:  Pneumovax (12/21/2007)  CBC   WBC:  2.6 (01/03/2008)   RBC:  3.70 (01/03/2008)   Hgb:  11.7 (01/03/2008)   Hct:  34.8 (01/03/2008)   Platelets:  101 (01/03/2008)   MCV  94.0 (01/03/2008)   MCHC  33.8 (01/03/2008)   RDW  13.3 (01/03/2008)   PMN:  51.6 (01/03/2008)   Lymphs:  33.7 (01/03/2008)   Monos:  12.7 (01/03/2008)   Eosinophils:  1.2 (01/03/2008)   Basophil:  0.8 (01/03/2008)  Complete Metabolic Panel   Glucose:  160 (05/20/2009)   Sodium:  141 (05/20/2009)   Potassium:  4.1 (05/20/2009)   Chloride:  104 (05/20/2009)   CO2:  32 (05/20/2009)   BUN:  14 (05/20/2009)   Creatinine:  0.7 (05/20/2009)   Albumin:  3.7 (08/13/2010)   Total Protein:  7.3 (08/13/2010)   Calcium:  9.5 (05/20/2009)   Total Bili:  0.5 (08/13/2010)   Alk Phos:  66 (08/13/2010)   SGPT (ALT):  37 (08/13/2010)   SGOT (AST):  48 (08/13/2010)   Current Medications (verified): 1)  Lisinopril 40 Mg  Tabs (Lisinopril) .Marland Kitchen.. 1 By Mouth Once Daily 2)  Novolin 70/30 70-30 %  Susp (Insulin Isophane & Regular) .... 25 Units Subcutaneously Am and 20 Units Subcutaneously Pm 3)  Norvasc 10 Mg  Tabs (Amlodipine  Besylate) .Marland Kitchen.. 1 By Mouth Once Daily 4)  Hydrochlorothiazide 25 Mg Tabs (Hydrochlorothiazide) .... Take One (1) By Mouth Daily 5)  Pravastatin Sodium 40 Mg Tabs (Pravastatin Sodium) .Marland Kitchen.. 1 By Mouth Once Daily 6)  Omeprazole 20 Mg Cpdr (Omeprazole) .Marland Kitchen.. 1 By Mouth Two Times A Day 7)  Vitamin D (Ergocalciferol) 50000 Unit Caps (Ergocalciferol) .... Take 1 Q Week 8)  Labetalol Hcl 200 Mg Tabs (Labetalol Hcl) .Marland Kitchen.. 1 By Mouth Two Times A Day  Allergies (verified): No Known Drug Allergies  Past History:  Past Medical History: Diabetes mellitus, type 2 - insulin dep Hypertension Hyperlipidemia Osteopenia  Depression abnormal LFTs - neg Hep B/C serology 07/2009  Past Surgical History: Cholecystectomy (02/12/2007) Tonsillectomy  left eye surg 07/29/09 - [facility]  Review of  Systems  The patient denies fever, weight loss, peripheral edema, headaches, muscle weakness, and suspicious skin lesions.    Physical Exam  General:  alert, well-developed, well-nourished, and cooperative to examination.   coarse body features - hands and face Lungs:  normal respiratory effort, no intercostal retractions or use of accessory muscles; normal breath sounds bilaterally - no crackles and no wheezes.    Heart:  normal rate, regular rhythm, no murmur, and no rub. BLE without edema.  Psych:  Oriented X3, memory intact for recent and remote, normally interactive, good eye contact, not anxious appearing, not depressed appearing, and not agitated.      Impression & Recommendations:  Problem # 1:  DIABETES MELLITUS, TYPE I (ICD-250.01)  Her updated medication list for this problem includes:    Lisinopril 40 Mg Tabs (Lisinopril) .Marland Kitchen... 1 by mouth once daily    Novolin 70/30 70-30 % Susp (Insulin isophane & regular) .Marland Kitchen... 25 units subcutaneously am and 20 units subcutaneously pm  insulin dep since age 29 - doing well -  check labs today -  no change but reminded of need for med compliance as rx'd  Labs Reviewed: Creat: 0.7 (05/20/2009)     Last Eye Exam: diabetic retinopathy (07/29/2009) Reviewed HgBA1c results: 7.3 (08/13/2010)  8.0 (04/13/2010)  Orders: TLB-A1C / Hgb A1C (Glycohemoglobin) (83036-A1C)  Problem # 2:  HYPERLIPIDEMIA (ICD-272.4)  Her updated medication list for this problem includes:    Pravastatin Sodium 40 Mg Tabs (Pravastatin sodium) .Marland Kitchen... 1 by mouth once daily  Orders: TLB-Lipid Panel (80061-LIPID)  changed simva to prava 07/2010 due to interaction with amlodipine noncompliance hx - reports improved - recheck now  Labs Reviewed: SGOT: 48 (08/13/2010)   SGPT: 37 (08/13/2010)   HDL:76.40 (08/13/2010), 77.90 (09/15/2009)  LDL:DEL (05/26/2008), DEL (01/03/2008)  Chol:286 (08/13/2010), 249 (09/15/2009)  Trig:148.0 (08/13/2010), 95.0  (09/15/2009)  Problem # 3:  HYPERTENSION (ICD-401.9)  again, hx of variable compliance due to concern for adv SE - same reviewed and need to improve reminded pt of "adv se" of uncontrolled htn risks -- Her updated medication list for this problem includes:    Lisinopril 40 Mg Tabs (Lisinopril) .Marland Kitchen... 1 by mouth once daily    Norvasc 10 Mg Tabs (Amlodipine besylate) .Marland Kitchen... 1 by mouth once daily    Hydrochlorothiazide 25 Mg Tabs (Hydrochlorothiazide) .Marland Kitchen... Take one (1) by mouth daily    Labetalol Hcl 200 Mg Tabs (Labetalol hcl) .Marland Kitchen... 1 by mouth two times a day  BP today: 160/62 Prior BP: 148/60 (08/13/2010)  Labs Reviewed: K+: 4.1 (05/20/2009) Creat: : 0.7 (05/20/2009)   Chol: 286 (08/13/2010)   HDL: 76.40 (08/13/2010)   LDL: DEL (05/26/2008)   TG: 148.0 (08/13/2010)  Orders: TLB-BMP (Basic Metabolic  Panel-BMET) (80048-METABOL)  Complete Medication List: 1)  Lisinopril 40 Mg Tabs (Lisinopril) .Marland Kitchen.. 1 by mouth once daily 2)  Novolin 70/30 70-30 % Susp (Insulin isophane & regular) .... 25 units subcutaneously am and 20 units subcutaneously pm 3)  Norvasc 10 Mg Tabs (Amlodipine besylate) .Marland Kitchen.. 1 by mouth once daily 4)  Hydrochlorothiazide 25 Mg Tabs (Hydrochlorothiazide) .... Take one (1) by mouth daily 5)  Pravastatin Sodium 40 Mg Tabs (Pravastatin sodium) .Marland Kitchen.. 1 by mouth once daily 6)  Omeprazole 20 Mg Cpdr (Omeprazole) .Marland Kitchen.. 1 by mouth two times a day 7)  Vitamin D (ergocalciferol) 50000 Unit Caps (Ergocalciferol) .... Take 1 q week 8)  Labetalol Hcl 200 Mg Tabs (Labetalol hcl) .Marland Kitchen.. 1 by mouth two times a day  Other Orders: TLB-Hepatic/Liver Function Pnl (80076-HEPATIC)  Patient Instructions: 1)  it was good to see you today. 2)  Please take all medications as directed. Contact our office if you believe you're having problems with the medication(s).  3)  test(s) ordered today - your results will be called to you after review in 48-72 hours from the time of test completion 4)  continue  to take insulin 25 units AM and 20 units PM - also continue watch your diet and exercise 5)  Please schedule a follow-up appointment in 3-4 months to check diabetes and blood pressure, call sooner if problems.    Orders Added: 1)  TLB-A1C / Hgb A1C (Glycohemoglobin) [83036-A1C] 2)  TLB-Lipid Panel [80061-LIPID] 3)  TLB-Hepatic/Liver Function Pnl [80076-HEPATIC] 4)  Est. Patient Level IV [id number] 5)  TLB-BMP (Basic Metabolic Panel-BMET) [id number]

## 2010-12-27 ENCOUNTER — Ambulatory Visit: Payer: Self-pay

## 2010-12-28 NOTE — Progress Notes (Signed)
Summary: CLARIFICATION-MED----  Phone Note Call from Patient   Caller: Patient Summary of Call: Per Pt:  cancel Labetalol.  The other meds are being ordered from [facility]--Vit D and Insulin pt get from [facility] Ph#:  [phone] Initial call taken by: Lucienne Capers,  December 17, 2010 2:42 PM

## 2011-03-18 NOTE — Op Note (Signed)
[name] [name], [name] [name]               ACCOUNT NO.:  [id number]   MEDICAL RECORD NO.:  [id number]          PATIENT TYPE:  AMB   LOCATION:  SDS                          FACILITY:  [facility]   PHYSICIAN:  Imogene Burn. Georgette Dover, M.D. DATE OF BIRTH:  1941-04-12   DATE OF PROCEDURE:  02/12/2007  DATE OF DISCHARGE:                               OPERATIVE REPORT   PREOPERATIVE DIAGNOSIS:  Chronic calculus cholecystitis.   POSTOPERATIVE DIAGNOSIS:  Chronic calculus cholecystitis.   PROCEDURE PERFORMED:  Laparoscopic cholecystectomy with intraoperative  cholangiogram.   SURGEON:  Imogene Burn. Georgette Dover, M.D.   ASSISTANT:  Haywood Lasso, M.D.   ANESTHESIA:  General endotracheal.   INDICATIONS:  The patient is a 69 year old female who presents with  several months of upper abdominal pain.  Most of her pain was in the  epigastrium and left upper quadrant but lately she has had more symptoms  in the right upper quadrant.  Ultrasound showed cholelithiasis and a  mildly thickened gallbladder wall.  EGD and CT scan were unremarkable.  The patient now presents for elective cholecystectomy.   DESCRIPTION OF PROCEDURE:  The patient was brought to the operating room  and placed in the supine position on the operating table.  After an  adequate level of general anesthesia was obtained, the patient's abdomen  was prepped with Betadine and draped in a sterile fashion.  A time-out  was taken to assure the proper patient and proper procedure.  Her  umbilicus was infiltrated with 0.25% Marcaine and a transverse incision  was made.  Dissection was carried down to the fascia, which was opened  vertically.  The peritoneum was entered bluntly.  A stay suture of 0  Vicryl was placed around the fascial opening.  Pneumoperitoneum was then  was obtained by inserting a Hasson cannula, securing it with the stay  sutures and insufflating CO2, maintaining a maximal pressure of 15 mmHg.  The laparoscope was inserted and the  patient was positioned in the  reverse Trendelenburg position and tilted to her left.  Her liver  appeared very hard and nodular but there were no obvious masses.  The  gallbladder did not appear to be inflamed.  A 10 mm port was placed in  the subxiphoid position and two 5 mm ports in the right upper quadrant.  The gallbladder was grasped with clamp and elevated over the edge of the  liver.  We opened the peritoneum around the hilum of the gallbladder.  There appeared to be very a short cystic duct.  We circumferentially  dissected around this and placed a clip proximally.  A small opening was  created on the cystic duct and a Cook cholangiogram catheter was  threaded in the cystic duct and secured with a clip.  We then he  obtained a cholangiogram, which showed good flow proximally and distally  with easy flow into the duodenum.  Initially there was a small filling  defect, which was very mobile.  A subsequent fluoroscopy run showed that  this filling defect moved to the duodenum and disappeared.  This  probably represented an air  bubble.  The catheter was removed and the  cystic duct was ligated with clips and divided.  The cystic artery was  also ligated with clips and divided.  The posterior branch was  identified and was ligated and divided.  Cautery was then used to remove  the gallbladder from the liver bed.  He there was significant oozing  from the gallbladder fossa.  Once the gallbladder was detached, it was  placed in an EndoCatch sac.  We then irrigated the gallbladder fossa  thoroughly and cauterized and cauterized it thoroughly for hemostasis.  The gallbladder was then removed through the umbilical port site.  We  reinspected the right upper quadrant for hemostasis.  One piece of  Surgicel was placed in the gallbladder fossa.  The ports were then  removed as pneumoperitoneum was released.  The stay suture was tied down  to close the umbilical fascia.  Monocryl 4-0 was used  to close all the  skin incisions.  Steri-Strips and clean dressings were applied.  The  patient was extubated and brought to the recovery room in stable  condition.  All sponge, instrument and needle counts were correct.      Imogene Burn. Tsuei, M.D.  Electronically Signed     MKT/MEDQ  D:  02/12/2007  T:  02/12/2007  Job:  [id number]

## 2011-03-18 NOTE — Assessment & Plan Note (Signed)
[facility] OFFICE NOTE   NAME:[name], [name] STEIDINGER                      MRN:          [id number]  DATE:10/19/2006                            DOB:          07/04/1941    REASON FOR CONSULTATION:  Abdominal pain.   ASSESSMENT:  A 70 year old African American woman with a several-month  history of epigastric and left upper quadrant pain and some right upper  quadrant pain.  An ultrasound today does show cholelithiasis and a  thickened gallbladder wall.  However she is tender in her left upper  quadrant and most of her symptoms are there, so I do not think the  gallbladder is the source of this problem or at least all of it.  She  has been losing some weight but her hemoglobin A1C was 9.5%, perhaps  that is at least part of that cause.  She has some chronic constipation.   PLAN:  1. Schedule for upper GI endoscopy, further plans pending that.  She      is diabetic.  A gastric emptying study could be needed.  Some of      this pain could be neuropathic.  2. She may need a cholecystectomy.  3. She may need a CT scan.  4. She will need a colonoscopy at least for screening at some point,      if not to investigate constipation and abdominal pain, depending      upon these results.   Risks, benefits and indications are explained.  She understands and  agrees to proceed.   HISTORY:  A 70 year old [place] American woman that has been having  problems like this for several months, perhaps starting in the spring.  She describes an intermittent pain that occurs somewhat randomly in the  epigastric and left upper quadrant area but sometimes in the right upper  quadrant.  There is a severe drawing sensation she says in the left  upper quadrant.  It has radiated into the lower chest at times.  She  felt gassy and bloated and she has been somewhat constipated but that  may be a chronic thing.  Her appetite is off and there  is probably some  early satiety.  She was 165 pounds a few years ago and she is 152 pounds  today.  No bleeding.  She may have had a sigmoidoscopy versus  colonoscopy, though it sounds like a sigmoidoscopy years ago by Dr.  Inda Merlin.   MEDICATIONS:  On 70/30 insulin, 40 units morning, 40 units evening.  Blood pressure pill, question lisinopril.  Prevpac has been started for  a positive H. pylori serology within the last few days.   DRUG ALLERGIES:  None known.   There is also rare Aleve use.   PAST MEDICAL HISTORY:  1. Diabetes mellitus type 2.  2. Hypertension.  3. Allergies.   FAMILY HISTORY:  Heart disease in her parents.  Diabetes in siblings and  mother.   SOCIAL HISTORY:  She is married, she is here with her husband.  She is  retired, 3 sons, 3 daughters.  No alcohol, tobacco  or drugs.   REVIEW OF SYSTEMS:  She has some chronic low-grade neck and low back  pain, not related to this problem.  She has a cough at times.  Allergies, eye glasses, some muscle pains.  She has felt dyspneic and  she has a heart murmur.  She has been urinating at night.  All other  systems are negative.   PHYSICAL EXAMINATION:  Reveals a well-developed, middle aged black woman  in no acute distress.  Height 5 feet 5-1/2 inches. Weight 152 pounds.  Blood pressure 142/68.  Pulse 70.  EYES:  Anicteric.  ENT shows dentures otherwise free of lesions.  NECK:  Is supple without thyromegaly or mass.  CHEST:  Clear.  HEART:  S1, S2.  No murmurs, rubs or gallops.  ABDOMEN:  Is tender in the left upper quadrant with some guarding  without mass.  The ribs are nontender.  The right upper quadrant and  epigastrium are not really tender.  There is no organomegaly or mass  palpated.  LYMPHATIC:  No neck or supraclavicular nodes.  EXTREMITIES:  No edema.  SKIN:  No rash, warm, dry.  NEURO:  She is alert and oriented x3.   I appreciate the opportunity to care for this patient.   NOTE:  CBC  December 11,  white count 3.8, platelets 134 which are  slightly low, CMET normal except for AST of 74. Hemoglobin A1C 9.8%.  Her ALT was 64.  TSH normal.  Microalbumin 59 which is high in the  urine.  Her Helicobacter antibody was positive.     Gatha Mayer, MD,FACG  Electronically Signed    CEG/MedQ  DD: 10/19/2006  DT: 10/19/2006  Job #: [id number]   cc:   [name], M.D.

## 2011-04-08 ENCOUNTER — Encounter: Payer: Self-pay | Admitting: Internal Medicine

## 2011-04-22 ENCOUNTER — Other Ambulatory Visit (INDEPENDENT_AMBULATORY_CARE_PROVIDER_SITE_OTHER): Payer: Medicare PPO

## 2011-04-22 ENCOUNTER — Ambulatory Visit (INDEPENDENT_AMBULATORY_CARE_PROVIDER_SITE_OTHER): Payer: Medicare PPO | Admitting: Internal Medicine

## 2011-04-22 ENCOUNTER — Encounter: Payer: Self-pay | Admitting: Internal Medicine

## 2011-04-22 DIAGNOSIS — R5381 Other malaise: Secondary | ICD-10-CM

## 2011-04-22 DIAGNOSIS — E785 Hyperlipidemia, unspecified: Secondary | ICD-10-CM

## 2011-04-22 DIAGNOSIS — R5383 Other fatigue: Secondary | ICD-10-CM

## 2011-04-22 DIAGNOSIS — E109 Type 1 diabetes mellitus without complications: Secondary | ICD-10-CM

## 2011-04-22 DIAGNOSIS — I10 Essential (primary) hypertension: Secondary | ICD-10-CM

## 2011-04-22 DIAGNOSIS — R7401 Elevation of levels of liver transaminase levels: Secondary | ICD-10-CM

## 2011-04-22 LAB — CBC WITH DIFFERENTIAL/PLATELET
Basophils Absolute: 0 10*3/uL (ref 0.0–0.1)
HCT: 30.2 % — ABNORMAL LOW (ref 36.0–46.0)
Lymphocytes Relative: 32.2 % (ref 12.0–46.0)
Lymphs Abs: 1 10*3/uL (ref 0.7–4.0)
Monocytes Relative: 11.9 % (ref 3.0–12.0)
Neutrophils Relative %: 52.8 % (ref 43.0–77.0)
Platelets: 113 10*3/uL — ABNORMAL LOW (ref 150.0–400.0)
RDW: 13.9 % (ref 11.5–14.6)
WBC: 3.1 10*3/uL — ABNORMAL LOW (ref 4.5–10.5)

## 2011-04-22 LAB — HEPATIC FUNCTION PANEL
ALT: 37 U/L — ABNORMAL HIGH (ref 0–35)
AST: 47 U/L — ABNORMAL HIGH (ref 0–37)
Albumin: 3.5 g/dL (ref 3.5–5.2)
Total Protein: 6.7 g/dL (ref 6.0–8.3)

## 2011-04-22 LAB — HEMOGLOBIN A1C: Hgb A1c MFr Bld: 8.4 % — ABNORMAL HIGH (ref 4.6–6.5)

## 2011-04-22 LAB — TSH: TSH: 1.03 u[IU]/mL (ref 0.35–5.50)

## 2011-04-22 NOTE — Patient Instructions (Signed)
It was good to see you today. We have reviewed your prior records including labs and tests today Test(s) ordered today. Your results will be called to you after review (48-72hours after test completion). If any changes need to be made, you will be notified at that time. Will Refill on medication(s) as discussed today after lab review. Please schedule followup in 3-4 months, call sooner if problems.

## 2011-04-22 NOTE — Assessment & Plan Note (Signed)
Mild, stable Hep b/c serology neg 07/2009 Recheck LFTs now, on statin

## 2011-04-22 NOTE — Assessment & Plan Note (Signed)
BP Readings from Last 3 Encounters:  04/22/11 142/60  12/17/10 160/62  08/13/10 148/60   The current medical regimen is effective;  continue present plan and medications.

## 2011-04-22 NOTE — Assessment & Plan Note (Signed)
uses 70/30 but takes only qd, not bid Noncompliance reviewed and need for improved dose compliance - pt agrees to try  Cont to follow with eye specialists Check a1c now

## 2011-04-22 NOTE — Progress Notes (Signed)
  Subjective:    Patient ID: [name] [name], female    DOB: 1941-07-31, 70 y.o.   MRN: [id number]  HPI Here for follow up - reviewed chronic med issues:  dyslipidemia - reinforced need for compliance with pravachol 07/2010 due to terible lipids  no adv SE like GI upset or muscle cramping  declines lipitor or zocor due to concern for poss SE  now 100% compliance reported with meds   DM2 - still taking insulin only once daily   denies hypoglycemic symptoms or events -  reports improved dietary discretion  Follows with optho regularly  HTN - reports compliance with ongoing medical treatment and no changes in medication dose or frequency.  denies adverse side effects related to current therapy. no cough or ankle swelling - no CP or HA   Past Medical History  Diagnosis Date  . HYPERTENSION   . OSTEOPENIA   . Unspecified vitamin D deficiency   . Headache   . POSTMENOPAUSAL STATUS   . TRANSAMINASES, SERUM, ELEVATED   . HYPERLIPIDEMIA   . CARDIAC MURMUR   . GLAUCOMA   . GERD   . DIABETES MELLITUS, TYPE I   . DEPRESSION      Review of Systems  Constitutional: Negative for unexpected weight change.  Respiratory: Negative for shortness of breath.   Cardiovascular: Negative for chest pain.  Neurological: Negative for headaches.       Objective:   Physical Exam BP 142/60  Pulse 67  Temp(Src) 98.8 F (37.1 C) (Oral)  Ht 5' 5.5" (1.664 m)  Wt 156 lb (70.761 kg)  BMI 25.56 kg/m2  SpO2 98% Physical Exam  Constitutional: She is oriented to person, place, and time. She appears well-developed and well-nourished. No distress.  Eyes: Conjunctivae and EOM are normal. Pupils are equal, round, and reactive to light. No scleral icterus.  Neck: Normal range of motion. Neck supple. No JVD present. No thyromegaly present.  Cardiovascular: Normal rate, regular rhythm and normal heart sounds.  No murmur heard. No BLE edema. Pulmonary/Chest: Effort normal and breath sounds normal. No  respiratory distress. She has no wheezes.  Neurological: She is alert and oriented to person, place, and time. No cranial nerve deficit. Coordination normal.  Skin: Skin is warm and dry. No rash noted. No erythema.  Psychiatric: She has a normal mood and affect. Her behavior is normal. Judgment and thought content normal.   Wt Readings from Last 3 Encounters:  04/22/11 156 lb (70.761 kg)  12/17/10 160 lb 12.8 oz (72.938 kg)  08/13/10 153 lb (69.4 kg)   Lab Results  Component Value Date   WBC 2.6* 01/03/2008   HGB 11.7* 01/03/2008   HCT 34.8* 01/03/2008   PLT 101* 01/03/2008   CHOL 241* 12/17/2010   TRIG 176.0* 12/17/2010   HDL 84.10 12/17/2010   LDLDIRECT 138.9 12/17/2010   ALT 36* 12/17/2010   AST 48* 12/17/2010   NA 139 12/17/2010   K 4.0 12/17/2010   CL 102 12/17/2010   CREATININE 0.9 12/17/2010   BUN 17 12/17/2010   CO2 32 12/17/2010   TSH 0.96 10/10/2006   HGBA1C 8.9* 12/17/2010   MICROALBUR 174.7* 01/03/2008        Assessment & Plan:  See problem list. Medications and labs reviewed today.

## 2011-04-22 NOTE — Assessment & Plan Note (Signed)
Variable statin compliance - on prava as "afraid" of simva and atorva

## 2011-04-24 MED ORDER — INSULIN NPH ISOPHANE & REGULAR (70-30) 100 UNIT/ML ~~LOC~~ SUSP: 25.0000 [IU] | Freq: Two times a day (BID) | SUBCUTANEOUS | Status: DC

## 2011-04-24 MED ORDER — LISINOPRIL 40 MG PO TABS: 40.0000 mg | ORAL_TABLET | Freq: Every day | ORAL | Status: DC

## 2011-04-24 MED ORDER — AMLODIPINE BESYLATE 10 MG PO TABS: 10.0000 mg | ORAL_TABLET | Freq: Every day | ORAL | Status: DC

## 2011-04-24 MED ORDER — HYDROCHLOROTHIAZIDE 25 MG PO TABS: 25.0000 mg | ORAL_TABLET | Freq: Every day | ORAL | Status: DC

## 2011-04-24 MED ORDER — PRAVASTATIN SODIUM 80 MG PO TABS: 80.0000 mg | ORAL_TABLET | Freq: Every day | ORAL | Status: DC

## 2011-04-24 MED ORDER — LABETALOL HCL 200 MG PO TABS: 200.0000 mg | ORAL_TABLET | Freq: Two times a day (BID) | ORAL | Status: DC

## 2011-04-24 MED ORDER — OMEPRAZOLE 20 MG PO CPDR: 20.0000 mg | DELAYED_RELEASE_CAPSULE | Freq: Two times a day (BID) | ORAL | Status: DC

## 2011-04-25 ENCOUNTER — Other Ambulatory Visit: Payer: Self-pay | Admitting: *Deleted

## 2011-04-25 DIAGNOSIS — E109 Type 1 diabetes mellitus without complications: Secondary | ICD-10-CM

## 2011-04-25 DIAGNOSIS — I10 Essential (primary) hypertension: Secondary | ICD-10-CM

## 2011-04-25 DIAGNOSIS — E785 Hyperlipidemia, unspecified: Secondary | ICD-10-CM

## 2011-04-25 MED ORDER — LABETALOL HCL 200 MG PO TABS: 200.0000 mg | ORAL_TABLET | Freq: Two times a day (BID) | ORAL | Status: DC

## 2011-04-25 MED ORDER — LISINOPRIL 40 MG PO TABS: 40.0000 mg | ORAL_TABLET | Freq: Every day | ORAL | Status: DC

## 2011-04-25 MED ORDER — PRAVASTATIN SODIUM 80 MG PO TABS: 80.0000 mg | ORAL_TABLET | Freq: Every day | ORAL | Status: DC

## 2011-04-25 MED ORDER — AMLODIPINE BESYLATE 10 MG PO TABS: 10.0000 mg | ORAL_TABLET | Freq: Every day | ORAL | Status: DC

## 2011-04-25 MED ORDER — HYDROCHLOROTHIAZIDE 25 MG PO TABS: 25.0000 mg | ORAL_TABLET | Freq: Every day | ORAL | Status: DC

## 2011-04-25 MED ORDER — OMEPRAZOLE 20 MG PO CPDR: 20.0000 mg | DELAYED_RELEASE_CAPSULE | Freq: Two times a day (BID) | ORAL | Status: DC

## 2011-04-25 NOTE — Telephone Encounter (Signed)
Called pt with her lab results from Friday. Pt is requesting refills on all meds [facility]. Sending refills to mail service.Marland KitchenMarland Kitchen6/25/12@ 2:36pm/LMB

## 2011-07-28 LAB — URINALYSIS, ROUTINE W REFLEX MICROSCOPIC
Glucose, UA: NEGATIVE
Leukocytes, UA: NEGATIVE
Protein, ur: >300 — AB
pH: 6.5

## 2011-07-28 LAB — URINE MICROSCOPIC-ADD ON

## 2011-07-28 LAB — POCT CARDIAC MARKERS: Myoglobin, poc: 185

## 2011-07-28 LAB — POCT I-STAT, CHEM 8
Calcium, Ion: 1.18
Creatinine, Ser: 0.9
Glucose, Bld: 88
Hemoglobin: 12.9
Potassium: 3.6

## 2011-08-26 ENCOUNTER — Ambulatory Visit (INDEPENDENT_AMBULATORY_CARE_PROVIDER_SITE_OTHER): Payer: Medicare PPO | Admitting: Internal Medicine

## 2011-08-26 ENCOUNTER — Other Ambulatory Visit (INDEPENDENT_AMBULATORY_CARE_PROVIDER_SITE_OTHER): Payer: Medicare PPO

## 2011-08-26 ENCOUNTER — Encounter: Payer: Self-pay | Admitting: Internal Medicine

## 2011-08-26 VITALS — BP 162/60 | HR 70 | Temp 97.9°F | Wt 156.4 lb

## 2011-08-26 DIAGNOSIS — Z13820 Encounter for screening for osteoporosis: Secondary | ICD-10-CM

## 2011-08-26 DIAGNOSIS — D61818 Other pancytopenia: Secondary | ICD-10-CM

## 2011-08-26 DIAGNOSIS — E785 Hyperlipidemia, unspecified: Secondary | ICD-10-CM

## 2011-08-26 DIAGNOSIS — R7401 Elevation of levels of liver transaminase levels: Secondary | ICD-10-CM

## 2011-08-26 DIAGNOSIS — E109 Type 1 diabetes mellitus without complications: Secondary | ICD-10-CM

## 2011-08-26 DIAGNOSIS — Z23 Encounter for immunization: Secondary | ICD-10-CM

## 2011-08-26 DIAGNOSIS — I10 Essential (primary) hypertension: Secondary | ICD-10-CM

## 2011-08-26 LAB — CBC WITH DIFFERENTIAL/PLATELET
Basophils Absolute: 0 10*3/uL (ref 0.0–0.1)
Eosinophils Absolute: 0.1 10*3/uL (ref 0.0–0.7)
Lymphocytes Relative: 26.8 % (ref 12.0–46.0)
MCHC: 34.2 g/dL (ref 30.0–36.0)
Neutrophils Relative %: 59.6 % (ref 43.0–77.0)
RDW: 13.6 % (ref 11.5–14.6)

## 2011-08-26 LAB — HEPATIC FUNCTION PANEL
AST: 52 U/L — ABNORMAL HIGH (ref 0–37)
Albumin: 3.4 g/dL — ABNORMAL LOW (ref 3.5–5.2)
Alkaline Phosphatase: 74 U/L (ref 39–117)
Total Protein: 6.9 g/dL (ref 6.0–8.3)

## 2011-08-26 MED ORDER — LABETALOL HCL 300 MG PO TABS: 300.0000 mg | ORAL_TABLET | Freq: Two times a day (BID) | ORAL | Status: DC

## 2011-08-26 NOTE — Assessment & Plan Note (Signed)
uses 70/30 but takes only qd, not bid Noncompliance reviewed and need for improved dose compliance - pt agrees to try  Cont to follow with eye specialists Check a1c now Lab Results  Component Value Date   HGBA1C 8.4* 04/22/2011

## 2011-08-26 NOTE — Patient Instructions (Signed)
It was good to see you today. Increase labetalol to 300mg  2x/day and remember to take insulin 2x/day - ok to take cholesterol medication in AM rather than none!!! Your prescription(s) have been submitted to your pharmacy. Please take as directed and contact our office if you believe you are having problem(s) with the medication(s). Test(s) ordered today. Your results will be called to you after review (48-72hours after test completion). If any changes need to be made, you will be notified at that time. Will check on bone density testing and schedule as needed Flu shot done today Please schedule followup in 4 months for diabetes mellitus and blood pressure check, call sooner if problems.

## 2011-08-26 NOTE — Assessment & Plan Note (Signed)
Variable statin compliance - on prava as "afraid" of simva and atorva Lipid Panel     Component Value Date/Time   CHOL 241* 12/17/2010 1112   TRIG 176.0* 12/17/2010 1112   HDL 84.10 12/17/2010 1112   CHOLHDL 3 12/17/2010 1112   VLDL 35.2 12/17/2010 1112

## 2011-08-26 NOTE — Progress Notes (Signed)
  Subjective:    Patient ID: [name] [name], female    DOB: [birth year], 70 y.o.   MRN: [id number]  HPI  Here for follow up - reviewed chronic med issues:  dyslipidemia - reinforced need for compliance with pravachol 07/2010 due to terible lipids  no adv SE like GI upset or muscle cramping - declines lipitor or zocor due to concern for poss SE  variable compliance reported with meds   DM2 - still taking insulin only once daily -denies hypoglycemic symptoms or events -  reports improved dietary discretion - Follows with optho regularly  HTN - reports compliance with ongoing medical treatment and no changes in medication dose or frequency.  denies adverse side effects related to current therapy. no cough or ankle swelling - no CP or HA   Past Medical History  Diagnosis Date  . HYPERTENSION   . OSTEOPENIA   . Unspecified vitamin D deficiency   . Headache   . POSTMENOPAUSAL STATUS   . TRANSAMINASES, SERUM, ELEVATED     mild, neg Hep B/C 2010; fatty liver US 8/10 and 4/11  . HYPERLIPIDEMIA   . GLAUCOMA   . GERD   . DIABETES MELLITUS, TYPE I   . DEPRESSION      Review of Systems  Constitutional: Negative for unexpected weight change.  Respiratory: Negative for shortness of breath.   Cardiovascular: Negative for chest pain.  Neurological: Negative for headaches.       Objective:   Physical Exam  BP 162/60  Pulse 70  Temp(Src) 97.9 F (36.6 C) (Oral)  Wt 156 lb 6.4 oz (70.943 kg)  SpO2 97%   Wt Readings from Last 3 Encounters:  08/26/11 156 lb 6.4 oz (70.943 kg)  04/22/11 156 lb (70.761 kg)  12/17/10 160 lb 12.8 oz (72.938 kg)   Constitutional: She appears well-developed and well-nourished. No distress.  Neck: Normal range of motion. Neck supple. No JVD present. No thyromegaly present.  Cardiovascular: Normal rate, regular rhythm and normal heart sounds.  No murmur heard. No BLE edema. Pulmonary/Chest: Effort normal and breath sounds normal. No respiratory  distress. She has no wheezes.  Neurological: She is alert and oriented to person, place, and time. No cranial nerve deficit. Coordination normal.  Skin: Skin is warm and dry. No rash noted. No erythema.  Psychiatric: She has a normal mood and affect. Her behavior is normal. Judgment and thought content normal.   Lab Results  Component Value Date   WBC 3.1* 04/22/2011   HGB 10.4* 04/22/2011   HCT 30.2* 04/22/2011   PLT 113.0* 04/22/2011   CHOL 241* 12/17/2010   TRIG 176.0* 12/17/2010   HDL 84.10 12/17/2010   LDLDIRECT 138.9 12/17/2010   ALT 37* 04/22/2011   AST 47* 04/22/2011   NA 139 12/17/2010   K 4.0 12/17/2010   CL 102 12/17/2010   CREATININE 0.9 12/17/2010   BUN 17 12/17/2010   CO2 32 12/17/2010   TSH 1.03 04/22/2011   HGBA1C 8.4* 04/22/2011   MICROALBUR 174.7* 01/03/2008        Assessment & Plan:  See problem list. Medications and labs reviewed today.

## 2011-08-26 NOTE — Assessment & Plan Note (Signed)
BP Readings from Last 3 Encounters:  08/26/11 162/60  04/22/11 142/60  12/17/10 160/62   The current medical regimen is not effective;  increase labetalol dose now, e- prescription done

## 2011-08-26 NOTE — Assessment & Plan Note (Signed)
Mild, stable - but also associated with mild pancytopenia Hep b/c serology neg 07/2009 Fatty liver on abdominal ultrasound August 2010 and stable April 2011 Recheck LFTs now, remains on statin

## 2011-12-16 LAB — HM MAMMOGRAPHY: HM Mammogram: NEGATIVE

## 2011-12-19 ENCOUNTER — Encounter: Payer: Self-pay | Admitting: Internal Medicine

## 2011-12-23 ENCOUNTER — Encounter: Payer: Self-pay | Admitting: Internal Medicine

## 2011-12-23 ENCOUNTER — Other Ambulatory Visit (INDEPENDENT_AMBULATORY_CARE_PROVIDER_SITE_OTHER): Payer: Medicare Other

## 2011-12-23 ENCOUNTER — Ambulatory Visit (INDEPENDENT_AMBULATORY_CARE_PROVIDER_SITE_OTHER): Payer: Self-pay | Admitting: Internal Medicine

## 2011-12-23 DIAGNOSIS — E785 Hyperlipidemia, unspecified: Secondary | ICD-10-CM

## 2011-12-23 DIAGNOSIS — E109 Type 1 diabetes mellitus without complications: Secondary | ICD-10-CM

## 2011-12-23 DIAGNOSIS — I10 Essential (primary) hypertension: Secondary | ICD-10-CM

## 2011-12-23 LAB — HEPATITIS B CORE ANTIBODY, IGM: Hep B C IgM: NEGATIVE

## 2011-12-23 LAB — HEMOGLOBIN A1C: Hgb A1c MFr Bld: 9.3 % — ABNORMAL HIGH (ref 4.6–6.5)

## 2011-12-23 MED ORDER — LABETALOL HCL 300 MG PO TABS: 300.0000 mg | ORAL_TABLET | Freq: Two times a day (BID) | ORAL | Status: DC

## 2011-12-23 MED ORDER — INSULIN NPH ISOPHANE & REGULAR (70-30) 100 UNIT/ML ~~LOC~~ SUSP: 25.0000 [IU] | Freq: Two times a day (BID) | SUBCUTANEOUS | Status: DC

## 2011-12-23 MED ORDER — AMLODIPINE BESYLATE 10 MG PO TABS: 10.0000 mg | ORAL_TABLET | Freq: Every day | ORAL | Status: DC

## 2011-12-23 MED ORDER — LISINOPRIL 40 MG PO TABS: 40.0000 mg | ORAL_TABLET | Freq: Every day | ORAL | Status: DC

## 2011-12-23 MED ORDER — PRAVASTATIN SODIUM 80 MG PO TABS: 80.0000 mg | ORAL_TABLET | Freq: Every day | ORAL | Status: DC

## 2011-12-23 MED ORDER — HYDROCHLOROTHIAZIDE 25 MG PO TABS: 25.0000 mg | ORAL_TABLET | Freq: Every day | ORAL | Status: DC

## 2011-12-23 MED ORDER — OMEPRAZOLE 20 MG PO CPDR: 20.0000 mg | DELAYED_RELEASE_CAPSULE | Freq: Two times a day (BID) | ORAL | Status: DC

## 2011-12-23 NOTE — Patient Instructions (Signed)
It was good to see you today. Please take your medications and insulin as prescribed!!! You need to call us between appointments if you have problems with you medications Your prescription(s) have been submitted to your pharmacy. Please take as directed and contact our office if you believe you are having problem(s) with the medication(s). Test(s) ordered today. Your results will be called to you after review (48-72hours after test completion). If any changes need to be made, you will be notified at that time. Please schedule followup in 4 months for diabetes mellitus and blood pressure check, call sooner if problems.

## 2011-12-23 NOTE — Assessment & Plan Note (Signed)
Mild, stable - but also associated with mild pancytopenia reviewed August 2010 Hep b/c serology > hep C negative but hep B. core positive, surface antibody negative  Question carrier status or conversion of hep B? Recheck serology hep B. And LFTs now  Fatty liver on abdominal ultrasound August 2010 and stable April 2011 remains on statin

## 2011-12-23 NOTE — Progress Notes (Signed)
  Subjective:    Patient ID: [name] [name], female    DOB: 1941-01-19, 71 y.o.   MRN: [id number]  HPI  Here for follow up - reviewed chronic med issues: Noncompliance remains largest barrier to care  dyslipidemia - reinforced need for compliance with pravachol 07/2010 due to terible lipids - declines lipitor or zocor due to concern for possible side effects - variable compliance reported    DM2 - still taking insulin only once daily -denies hypoglycemic symptoms or events -  reports improved dietary discretion - Follows with optho regularly  HTN - reports compliance with ongoing medical treatment and no changes in medication dose or frequency.  denies adverse side effects related to current therapy. no cough or ankle swelling - no chest pain or headache   Past Medical History  Diagnosis Date  . HYPERTENSION   . OSTEOPENIA   . Unspecified vitamin D deficiency   . Headache   . POSTMENOPAUSAL STATUS   . TRANSAMINASES, SERUM, ELEVATED     05/2009: neg Hep C; Hep B: core pos, Surf neg; fatty liver US 8/10 and 4/11  . HYPERLIPIDEMIA   . GLAUCOMA   . GERD   . DIABETES MELLITUS, TYPE I   . DEPRESSION      Review of Systems  Constitutional: Negative for unexpected weight change.  Respiratory: Negative for shortness of breath.   Cardiovascular: Negative for chest pain and palpitations.  Neurological: Negative for headaches.       Objective:   Physical Exam  BP 182/68  Pulse 90  Temp(Src) 97.2 F (36.2 C) (Oral)  Resp 18  Ht 5' 5.25" (1.657 m)  Wt 161 lb 8 oz (73.256 kg)  BMI 26.67 kg/m2  SpO2 97%   Wt Readings from Last 3 Encounters:  12/23/11 161 lb 8 oz (73.256 kg)  08/26/11 156 lb 6.4 oz (70.943 kg)  04/22/11 156 lb (70.761 kg)   Constitutional: She appears well-developed and well-nourished. No distress.  Neck: Normal range of motion. Neck supple. No JVD present. No thyromegaly present.  Cardiovascular: Normal rate, regular rhythm and normal heart sounds.  No  murmur heard. No BLE edema. Pulmonary/Chest: Effort normal and breath sounds normal. No respiratory distress. She has no wheezes.  Neurological: She is alert and oriented to person, place, and time. No cranial nerve deficit. Coordination normal.  Skin: Skin is warm and dry. No rash noted. No erythema.  Psychiatric: She has a normal mood and affect. Her behavior is normal. Judgment and thought content normal.   Lab Results  Component Value Date   WBC 3.5* 08/26/2011   HGB 10.1* 08/26/2011   HCT 29.5* 08/26/2011   PLT 116.0* 08/26/2011   CHOL 241* 12/17/2010   TRIG 176.0* 12/17/2010   HDL 84.10 12/17/2010   LDLDIRECT 138.9 12/17/2010   ALT 34 08/26/2011   AST 52* 08/26/2011   NA 139 12/17/2010   K 4.0 12/17/2010   CL 102 12/17/2010   CREATININE 0.9 12/17/2010   BUN 17 12/17/2010   CO2 32 12/17/2010   TSH 1.03 04/22/2011   HGBA1C 9.0* 08/26/2011   MICROALBUR 174.7* 01/03/2008        Assessment & Plan:  See problem list. Medications and labs reviewed today.  Noncompliance remains barrier to better health care

## 2011-12-23 NOTE — Assessment & Plan Note (Signed)
BP Readings from Last 3 Encounters:  12/23/11 182/68  08/26/11 162/60  04/22/11 142/60   The current medical regimen is not effective; limited by poor insight and noncompliance >> $$

## 2011-12-23 NOTE — Assessment & Plan Note (Signed)
uses 70/30 but takes only qd, not bid despite multiple medications and instructions regarding same Noncompliance reviewed and need for improved dose compliance - pt agrees to try  Cont to follow with eye specialists, on ACEI Check a1c now Lab Results  Component Value Date   HGBA1C 9.0* 08/26/2011

## 2011-12-26 ENCOUNTER — Other Ambulatory Visit: Payer: Self-pay | Admitting: Internal Medicine

## 2011-12-26 ENCOUNTER — Encounter: Payer: Self-pay | Admitting: Internal Medicine

## 2012-04-27 ENCOUNTER — Other Ambulatory Visit (INDEPENDENT_AMBULATORY_CARE_PROVIDER_SITE_OTHER): Payer: Medicare Other

## 2012-04-27 ENCOUNTER — Ambulatory Visit (INDEPENDENT_AMBULATORY_CARE_PROVIDER_SITE_OTHER): Payer: Medicare Other | Admitting: Internal Medicine

## 2012-04-27 ENCOUNTER — Encounter: Payer: Self-pay | Admitting: Internal Medicine

## 2012-04-27 VITALS — BP 154/70 | HR 80 | Temp 98.2°F | Resp 16 | Ht 65.0 in | Wt 158.0 lb

## 2012-04-27 DIAGNOSIS — E785 Hyperlipidemia, unspecified: Secondary | ICD-10-CM

## 2012-04-27 DIAGNOSIS — M899 Disorder of bone, unspecified: Secondary | ICD-10-CM

## 2012-04-27 DIAGNOSIS — E109 Type 1 diabetes mellitus without complications: Secondary | ICD-10-CM

## 2012-04-27 DIAGNOSIS — M949 Disorder of cartilage, unspecified: Secondary | ICD-10-CM

## 2012-04-27 DIAGNOSIS — B181 Chronic viral hepatitis B without delta-agent: Secondary | ICD-10-CM

## 2012-04-27 DIAGNOSIS — I10 Essential (primary) hypertension: Secondary | ICD-10-CM

## 2012-04-27 LAB — CBC WITH DIFFERENTIAL/PLATELET
Basophils Relative: 1 % (ref 0.0–3.0)
Eosinophils Absolute: 0.1 10*3/uL (ref 0.0–0.7)
Hemoglobin: 10.3 g/dL — ABNORMAL LOW (ref 12.0–15.0)
Lymphocytes Relative: 25 % (ref 12.0–46.0)
MCHC: 33.4 g/dL (ref 30.0–36.0)
MCV: 92.4 fl (ref 78.0–100.0)
Neutro Abs: 1.9 10*3/uL (ref 1.4–7.7)
RBC: 3.32 Mil/uL — ABNORMAL LOW (ref 3.87–5.11)

## 2012-04-27 LAB — HEPATIC FUNCTION PANEL
Bilirubin, Direct: 0.1 mg/dL (ref 0.0–0.3)
Total Bilirubin: 0.3 mg/dL (ref 0.3–1.2)
Total Protein: 7 g/dL (ref 6.0–8.3)

## 2012-04-27 LAB — LIPID PANEL
HDL: 64.9 mg/dL (ref >39.00)
Total CHOL/HDL Ratio: 5
VLDL: 30 mg/dL (ref 0.0–40.0)

## 2012-04-27 NOTE — Assessment & Plan Note (Addendum)
Mild, stable increase LFTs ?cirrhosis as also associated with pancytopenia - check CBC and ultrasound now reviewed August 2010 Hep b/c serology > hep C negative but hep B. core positive, surface antibody negative  Reviewed 12/2011 repeat labs> Hep B carrier status Recheck LFTs now  Pending hepatology eval 05/17/12 for same but pt reports ?cancelled - if true, will ask our GI to see for assistance as needed Fatty liver on abdominal ultrasound August 2010 and stable April 2011 - will recheck now remains on statin

## 2012-04-27 NOTE — Progress Notes (Signed)
Subjective:    Patient ID: [name] [name], female    DOB: [birth year], 71 y.o.   MRN: [id number]  HPI  Here for follow up - reviewed chronic medical issues: Noncompliance remains largest barrier to care  dyslipidemia -  continued variable compliance with pravachol- declines lipitor or zocor due to concern for possible side effects -  DM2 - still taking insulin only once daily -denies hypoglycemic symptoms or events -  reports improved dietary discretion - Follows with optho regularly  HTN - reports compliance with ongoing medical treatment and no changes in medication dose or frequency.  denies adverse side effects related to current therapy. no cough or ankle swelling - no chest pain or headache   Hep B carrier state, chronic increase LFTs, cirrhosis progression (only fatty liver changes on [place] 2011) - has not yet seen hepatology, scheduled for 04/2012 but concerned about cost - denies swelling abdomen or legs  Past Medical History  Diagnosis Date  . HYPERTENSION   . OSTEOPENIA   . Unspecified vitamin D deficiency   . Headache   . POSTMENOPAUSAL STATUS   . Hepatitis B carrier     05/2009: neg Hep C; Hep B: core pos, Surf neg; fatty liver US 8/10 and 4/11  . HYPERLIPIDEMIA   . GLAUCOMA   . GERD   . DIABETES MELLITUS, TYPE I   . DEPRESSION      Review of Systems  Constitutional: Negative for fatigue and unexpected weight change.  Respiratory: Negative for cough and shortness of breath.   Cardiovascular: Negative for chest pain and palpitations.  Gastrointestinal: Negative for blood in stool and abdominal distention.  Neurological: Negative for headaches.       Objective:   Physical Exam  BP 154/70  Pulse 80  Temp 98.2 F (36.8 C) (Oral)  Resp 16  Ht 5\' 5"  (1.651 m)  Wt 158 lb (71.668 kg)  BMI 26.29 kg/m2   Wt Readings from Last 3 Encounters:  04/27/12 158 lb (71.668 kg)  12/23/11 161 lb 8 oz (73.256 kg)  08/26/11 156 lb 6.4 oz (70.943 kg)   Constitutional:  She appears well-developed and well-nourished. No distress.  Neck: Normal range of motion. Neck supple. No JVD present. No thyromegaly present.  Cardiovascular: Normal rate, regular rhythm and normal heart sounds.  No murmur heard. No BLE edema. Pulmonary/Chest: Effort normal and breath sounds normal. No respiratory distress. She has no wheezes.  Abd: protuberant, NTND, +BS, no ascites or definitive HSM Neurological: She is alert and oriented to person, place, and time. No cranial nerve deficit. Coordination normal.  Skin: Skin is warm and dry. No rash noted. No erythema.  Psychiatric: She has a normal mood and affect. Her behavior is normal. Judgment and thought content normal.   Lab Results  Component Value Date   WBC 3.5* 08/26/2011   HGB 10.1* 08/26/2011   HCT 29.5* 08/26/2011   PLT 116.0* 08/26/2011   CHOL 241* 12/17/2010   TRIG 176.0* 12/17/2010   HDL 84.10 12/17/2010   LDLDIRECT 138.9 12/17/2010   ALT 34 08/26/2011   AST 52* 08/26/2011   NA 139 12/17/2010   K 4.0 12/17/2010   CL 102 12/17/2010   CREATININE 0.9 12/17/2010   BUN 17 12/17/2010   CO2 32 12/17/2010   TSH 1.03 04/22/2011   HGBA1C 9.3* 12/23/2011   MICROALBUR 174.7* 01/03/2008        Assessment & Plan:  See problem list. Medications and labs reviewed today.  Noncompliance remains barrier to  better health care

## 2012-04-27 NOTE — Patient Instructions (Signed)
It was good to see you today. Please take your medications and insulin as prescribed!!! You need to call us between appointments if you have problems with you medications Your prescription(s) have been submitted to your pharmacy. Please take as directed and contact our office if you believe you are having problem(s) with the medication(s). Test(s) ordered today. Your results will be called to you after review (48-72hours after test completion). If any changes need to be made, you will be notified at that time. we'll make referral for ultrasound and evaluation by gastroenterology for your liver. Our office will contact you regarding appointment(s) once made. Please schedule followup in 4 months for diabetes mellitus and blood pressure check, call sooner if problems.

## 2012-04-27 NOTE — Assessment & Plan Note (Signed)
uses 70/30 but takes only qd, not bid despite multiple medications and instructions regarding same Noncompliance reviewed and need for improved dose compliance - pt agrees to try  Cont to follow with eye specialists, on ACEI+pravastatin Check a1c now Lab Results  Component Value Date   HGBA1C 9.3* 12/23/2011

## 2012-04-27 NOTE — Assessment & Plan Note (Signed)
Variable statin compliance - on prava as "afraid" of simva and atorva Check lipids annually and adjust as needed

## 2012-04-27 NOTE — Assessment & Plan Note (Signed)
BP Readings from Last 3 Encounters:  04/27/12 154/70  12/23/11 182/68  08/26/11 162/60   The current medical regimen is not effective; limited by poor insight and noncompliance due to $$ There have been some probable medication, dietary and lifestyle compliance issues here. I have discussed with her the great importance of following the treatment plan exactly as directed in order to achieve a good medical outcome.

## 2012-05-02 ENCOUNTER — Ambulatory Visit
Admission: RE | Admit: 2012-05-02 | Discharge: 2012-05-02 | Disposition: A | Discharge status: Home / Self Care | Payer: Medicare Other | Source: Ambulatory Visit | Attending: Internal Medicine | Admitting: Internal Medicine

## 2012-05-02 DIAGNOSIS — B181 Chronic viral hepatitis B without delta-agent: Secondary | ICD-10-CM

## 2012-05-17 ENCOUNTER — Ambulatory Visit: Payer: Medicare Other | Admitting: Gastroenterology

## 2012-05-21 ENCOUNTER — Ambulatory Visit: Payer: Medicare Other | Admitting: Internal Medicine

## 2012-08-28 ENCOUNTER — Encounter: Payer: Self-pay | Admitting: Internal Medicine

## 2012-08-28 ENCOUNTER — Other Ambulatory Visit (INDEPENDENT_AMBULATORY_CARE_PROVIDER_SITE_OTHER): Payer: Medicare Other

## 2012-08-28 ENCOUNTER — Ambulatory Visit (INDEPENDENT_AMBULATORY_CARE_PROVIDER_SITE_OTHER): Payer: Medicare Other | Admitting: Internal Medicine

## 2012-08-28 VITALS — BP 208/78 | HR 74 | Temp 98.5°F | Ht 65.0 in | Wt 162.4 lb

## 2012-08-28 DIAGNOSIS — I10 Essential (primary) hypertension: Secondary | ICD-10-CM

## 2012-08-28 DIAGNOSIS — E109 Type 1 diabetes mellitus without complications: Secondary | ICD-10-CM

## 2012-08-28 DIAGNOSIS — Z23 Encounter for immunization: Secondary | ICD-10-CM

## 2012-08-28 DIAGNOSIS — B181 Chronic viral hepatitis B without delta-agent: Secondary | ICD-10-CM

## 2012-08-28 LAB — CBC WITH DIFFERENTIAL/PLATELET
Basophils Absolute: 0 10*3/uL (ref 0.0–0.1)
Basophils Relative: 0.6 % (ref 0.0–3.0)
Eosinophils Absolute: 0.1 10*3/uL (ref 0.0–0.7)
Lymphocytes Relative: 21.7 % (ref 12.0–46.0)
MCHC: 34 g/dL (ref 30.0–36.0)
MCV: 92.2 fl (ref 78.0–100.0)
Monocytes Absolute: 0.3 10*3/uL (ref 0.1–1.0)
Neutrophils Relative %: 66.9 % (ref 43.0–77.0)
Platelets: 112 10*3/uL — ABNORMAL LOW (ref 150.0–400.0)
RBC: 3.25 Mil/uL — ABNORMAL LOW (ref 3.87–5.11)
RDW: 14 % (ref 11.5–14.6)

## 2012-08-28 LAB — BASIC METABOLIC PANEL
BUN: 18 mg/dL (ref 6–23)
CO2: 30 mEq/L (ref 19–32)
Calcium: 8.8 mg/dL (ref 8.4–10.5)
Chloride: 105 mEq/L (ref 96–112)
Creatinine, Ser: 1.1 mg/dL (ref 0.4–1.2)

## 2012-08-28 LAB — HEPATIC FUNCTION PANEL
AST: 50 U/L — ABNORMAL HIGH (ref 0–37)
Albumin: 2.7 g/dL — ABNORMAL LOW (ref 3.5–5.2)
Alkaline Phosphatase: 78 U/L (ref 39–117)
Bilirubin, Direct: 0.1 mg/dL (ref 0.0–0.3)

## 2012-08-28 LAB — HEMOGLOBIN A1C: Hgb A1c MFr Bld: 7.6 % — ABNORMAL HIGH (ref 4.6–6.5)

## 2012-08-28 MED ORDER — LABETALOL HCL 300 MG PO TABS: 300.0000 mg | ORAL_TABLET | Freq: Three times a day (TID) | ORAL | Status: DC

## 2012-08-28 NOTE — Assessment & Plan Note (Signed)
BP Readings from Last 3 Encounters:  08/28/12 208/78  04/27/12 154/70  12/23/11 182/68   The current medical regimen is not effective; limited by poor insight and noncompliance >> $$ reviewed importance of compliance - no evidence of secondary organ damage or symptoms on exam today

## 2012-08-28 NOTE — Progress Notes (Signed)
Subjective:    Patient ID: [name] [name], female    DOB: 12/17/1940, 71 y.o.   MRN: [id number]  HPI  Here for follow up - reviewed chronic medical issues: Noncompliance remains largest barrier to care  dyslipidemia -  continued variable compliance with pravachol- declines lipitor or zocor due to concern for possible side effects -  DM2 - still taking insulin only once daily -denies hypoglycemic symptoms or events -  reports improved dietary discretion - Follows with optho regularly  HTN - reports variable compliance with medical treatment and no changes in medication dose or frequency.  denies adverse side effects related to current therapy. no cough or ankle swelling - no chest pain or headache   Hep B carrier state, chronic increase LFTs, cirrhosis progression (only fatty liver changes on [place] 2011) - has not yet seen hepatology, scheduled for 04/2012 but missed appt as concerned about cost - denies swelling abdomen or legs  Past Medical History  Diagnosis Date  . HYPERTENSION   . OSTEOPENIA   . Unspecified vitamin D deficiency   . Headache   . POSTMENOPAUSAL STATUS   . Hepatitis B carrier     05/2009: neg Hep C; Hep B: core pos, Surf neg; fatty liver US 8/10 - 7/13  . HYPERLIPIDEMIA   . GLAUCOMA   . GERD   . DIABETES MELLITUS, TYPE I   . DEPRESSION      Review of Systems  Constitutional: Negative for fatigue and unexpected weight change.  Respiratory: Negative for cough and shortness of breath.   Cardiovascular: Negative for chest pain and palpitations.  Gastrointestinal: Negative for blood in stool and abdominal distention.  Neurological: Negative for headaches.       Objective:   Physical Exam  BP 208/78  Pulse 74  Temp 98.5 F (36.9 C) (Oral)  Ht 5\' 5"  (1.651 m)  Wt 162 lb 6.4 oz (73.664 kg)  BMI 27.02 kg/m2  SpO2 97%   Wt Readings from Last 3 Encounters:  08/28/12 162 lb 6.4 oz (73.664 kg)  04/27/12 158 lb (71.668 kg)  12/23/11 161 lb 8 oz (73.256 kg)     Constitutional: She appears well-developed and well-nourished. No distress.  Neck: Normal range of motion. Neck supple. No JVD present. No thyromegaly present.  Cardiovascular: Normal rate, regular rhythm and normal heart sounds.  No murmur heard. No BLE edema. Pulmonary/Chest: Effort normal and breath sounds normal. No respiratory distress. She has no wheezes.  Abd: protuberant, NTND, +BS, no ascites or definitive HSM Neurological: She is alert and oriented to person, place, and time. No cranial nerve deficit. Coordination normal.  Skin: Skin is warm and dry. No rash noted. No erythema.  Psychiatric: She has a normal mood and affect. Her behavior is normal. Judgment and thought content normal.   Lab Results  Component Value Date   WBC 3.3* 04/27/2012   HGB 10.3* 04/27/2012   HCT 30.7* 04/27/2012   PLT 121.0* 04/27/2012   CHOL 312* 04/27/2012   TRIG 150.0* 04/27/2012   HDL 64.90 04/27/2012   LDLDIRECT 195.7 04/27/2012   ALT 44* 04/27/2012   AST 63* 04/27/2012   NA 139 12/17/2010   K 4.0 12/17/2010   CL 102 12/17/2010   CREATININE 0.9 12/17/2010   BUN 17 12/17/2010   CO2 32 12/17/2010   TSH 1.03 04/22/2011   HGBA1C 8.5* 04/27/2012   MICROALBUR 174.7* 01/03/2008   US Abdomen Complete  05/02/2012  *RADIOLOGY REPORT*  Clinical Data:  Abdominal pain.  Prior  cholecystectomy.  COMPLETE ABDOMINAL ULTRASOUND  Comparison:  Multiple exams, including 02/03/2010  Findings:  Gallbladder:  Surgically absent.  Common bile duct:  Measures 4 mm in diameter, within normal limits.  Liver:  Hyperechoic and slightly heterogeneous, favoring diffuse fatty infiltration.  A hyperechoic focus in the right hepatic lobe is ill-defined and currently measures 10 mm (formerly the same by my measurements).  IVC:  Unremarkable.  Pancreas:  The tail the pancreas is not well seen due to overlying bowel gas.  The pancreas appears otherwise unremarkable.  Spleen:  Measures 8 cm in length and appears normal.  Right Kidney:  Measures 11.9  cm in length and appears normal.  Left Kidney:  Measures 10.9 cm in length and appears normal.  Abdominal aorta:  No aneurysm identified.  IMPRESSION: 1.  Essentially stable hyperechoic lesion in the right hepatic lobe anteriorly, not entirely specific but likely a small hemangioma or related to underlying fatty infiltration. 2.  Coarsened echotexture and echogenicity suggest diffuse hepatic steatosis, similar to prior exam. 3.  The tail of the pancreas is not visualized on today's exam due to overlying bowel gas.  Original Report Authenticated By: Carron Curie, M.D.      Assessment & Plan:  See problem list. Medications and labs reviewed today.  Noncompliance remains barrier to better health care

## 2012-08-28 NOTE — Patient Instructions (Signed)
It was good to see you today. Please take your medications and insulin as prescribed!!! You need to call us between appointments if you have problems with you medications Increase labetolol to one pill 3x/day for blood pressure control and take insulin 2x/day- no other medicine changes today Your prescription(s) have been submitted to your pharmacy. Please take as directed and contact our office if you believe you are having problem(s) with the medication(s). Test(s) ordered today. Your results will be called to you after review (48-72hours after test completion). If any changes need to be made, you will be notified at that time. we'll make referral for evaluation by gastroenterology specialist for your liver. Our office will contact you regarding appointment(s) once made. Please schedule followup in 1-2 weeks for nurse visit to recheck your blood pressure  Please schedule followup in 4 months for diabetes mellitus and blood pressure check, call sooner if problems. Flu shot today

## 2012-08-28 NOTE — Assessment & Plan Note (Signed)
uses 70/30 but takes only qd, not bid despite multiple medications and instructions regarding same Noncompliance reviewed and need for improved dose compliance - pt agrees to try  Cont to follow with eye specialists, on ACEI+pravastatin, add ASA 81 Check a1c now Lab Results  Component Value Date   HGBA1C 8.5* 04/27/2012

## 2012-08-28 NOTE — Assessment & Plan Note (Addendum)
Mild, stable increase LFTs Suspect cirrhosis as also associated with pancytopenia - check CBC now reviewed August 2010 Hep b/c serology > hep C negative but hep B. core positive, surface antibody negative  Reviewed 12/2011 repeat labs> Hep B carrier status Recheck LFTs now  Pending hepatology eval 05/17/12 for same but pt never seen for same - refer to hepatology specialist now Fatty liver on abdominal ultrasound August 2010; stable 04/2012 -

## 2012-08-30 ENCOUNTER — Telehealth: Payer: Self-pay | Admitting: Internal Medicine

## 2012-08-30 NOTE — Telephone Encounter (Signed)
Dr Asa Lente , pt need labs for Hep C Genotype and HCV RNA Quantitative for Hep-C clinic.

## 2012-08-30 NOTE — Telephone Encounter (Signed)
Dr Asa Lente as on July 2013 medical specialist does not handle Hep C referrals they have to go to [facility] location. I called to clarify and put does not need for hep B only for hep C.  Thank you

## 2012-08-30 NOTE — Telephone Encounter (Signed)
Please clarify that pt needs these test for eval and tx of Hep B?? Are they planning to see her for Hep B?  Pt does not have hep C per prior testing, but i can order these tests if needed for eval/tx of Hep B  If the clinic ONLY does hep c, and not hep b, please cancel referral- thanks

## 2012-08-31 NOTE — Telephone Encounter (Signed)
Ok, thanks Hep C labs not ordered as not needed thanks

## 2012-09-11 ENCOUNTER — Encounter: Payer: Self-pay | Admitting: Internal Medicine

## 2012-09-11 ENCOUNTER — Ambulatory Visit (INDEPENDENT_AMBULATORY_CARE_PROVIDER_SITE_OTHER): Payer: Medicare Other | Admitting: Internal Medicine

## 2012-09-11 VITALS — BP 164/72 | HR 64 | Temp 98.0°F | Ht 65.0 in | Wt 158.8 lb

## 2012-09-11 DIAGNOSIS — I10 Essential (primary) hypertension: Secondary | ICD-10-CM

## 2012-09-11 DIAGNOSIS — B181 Chronic viral hepatitis B without delta-agent: Secondary | ICD-10-CM

## 2012-09-11 NOTE — Assessment & Plan Note (Signed)
Mild, stable increase LFTs Fatty liver on abdominal ultrasound August 2010; stable 04/2012 -  Suspect cirrhosis as also associated with pancytopenia - reviewed August 2010 Hep b/c serology > hep C negative but hep B. core positive, surface antibody negative  Reviewed 12/2011 repeat labs> Hep B carrier status Pt missed hepatology eval 05/17/12  - no needed for specialist at this time [facility] following only HepC)

## 2012-09-11 NOTE — Assessment & Plan Note (Signed)
BP Readings from Last 3 Encounters:  09/11/12 164/72  08/28/12 208/78  04/27/12 154/70   The current medical regimen is not effective; limited by poor insight and noncompliance >> $$ reviewed importance of compliance - no evidence of secondary organ damage or symptoms on exam today

## 2012-09-11 NOTE — Progress Notes (Signed)
Subjective:    Patient ID: [name] [name], female    DOB: May 01, 1941, 71 y.o.   MRN: [id number]  HPI  Here for follow up  hypertension - reviewed chronic medical issues: Noncompliance remains largest barrier to care  dyslipidemia -  continued variable compliance with pravachol- declines lipitor or zocor due to concern for possible side effects -  DM2 - still taking insulin only once daily -denies hypoglycemic symptoms or events -  reports improved dietary discretion - Follows with optho regularly  HTN - reports variable compliance with medical treatment and no changes in medication dose or frequency.  denies adverse side effects related to current therapy. no cough or ankle swelling - no chest pain or headache   Hep B carrier state, chronic increase LFTs, cirrhosis progression (only fatty liver changes on [place] 2011) - has not yet seen hepatology, missed 04/2012 appt as concerned about cost - denies swelling abdomen or legs  Past Medical History  Diagnosis Date  . HYPERTENSION   . OSTEOPENIA   . Unspecified vitamin D deficiency   . Headache   . POSTMENOPAUSAL STATUS   . Hepatitis B carrier     05/2009: neg Hep C; Hep B: core pos, Surf neg; fatty liver US 8/10 - 7/13  . HYPERLIPIDEMIA   . GLAUCOMA   . GERD   . DIABETES MELLITUS, TYPE I   . DEPRESSION      Review of Systems  Constitutional: Negative for fatigue and unexpected weight change.  Respiratory: Negative for cough and shortness of breath.   Cardiovascular: Negative for chest pain and palpitations.  Gastrointestinal: Negative for blood in stool and abdominal distention.  Neurological: Negative for headaches.       Objective:   Physical Exam  BP 164/72  Pulse 64  Temp 98 F (36.7 C) (Oral)  Ht 5\' 5"  (1.651 m)  Wt 158 lb 12.8 oz (72.031 kg)  BMI 26.43 kg/m2  SpO2 98%   Wt Readings from Last 3 Encounters:  09/11/12 158 lb 12.8 oz (72.031 kg)  08/28/12 162 lb 6.4 oz (73.664 kg)  04/27/12 158 lb (71.668 kg)    Constitutional: She appears well-developed and well-nourished. No distress.  Neck: Normal range of motion. Neck supple. No JVD present. No thyromegaly present.  Cardiovascular: Normal rate, regular rhythm and normal heart sounds.  No murmur heard. No BLE edema. Pulmonary/Chest: Effort normal and breath sounds normal. No respiratory distress. She has no wheezes.  Abd: protuberant, NTND, +BS, no ascites or definitive HSM Neurological: She is alert and oriented to person, place, and time. No cranial nerve deficit. Coordination normal.  Psychiatric: She has a normal mood and affect. Her behavior is normal. Judgment and thought content normal.   Lab Results  Component Value Date   WBC 3.4* 08/28/2012   HGB 10.2* 08/28/2012   HCT 29.9* 08/28/2012   PLT 112.0* 08/28/2012   CHOL 312* 04/27/2012   TRIG 150.0* 04/27/2012   HDL 64.90 04/27/2012   LDLDIRECT 195.7 04/27/2012   ALT 31 08/28/2012   AST 50* 08/28/2012   NA 140 08/28/2012   K 3.8 08/28/2012   CL 105 08/28/2012   CREATININE 1.1 08/28/2012   BUN 18 08/28/2012   CO2 30 08/28/2012   TSH 1.03 04/22/2011   HGBA1C 7.6* 08/28/2012   MICROALBUR 174.7* 01/03/2008   US Abdomen Complete  05/02/2012  *RADIOLOGY REPORT*  Clinical Data:  Abdominal pain.  Prior cholecystectomy.  COMPLETE ABDOMINAL ULTRASOUND  Comparison:  Multiple exams, including 02/03/2010  Findings:  Gallbladder:  Surgically absent.  Common bile duct:  Measures 4 mm in diameter, within normal limits.  Liver:  Hyperechoic and slightly heterogeneous, favoring diffuse fatty infiltration.  A hyperechoic focus in the right hepatic lobe is ill-defined and currently measures 10 mm (formerly the same by my measurements).  IVC:  Unremarkable.  Pancreas:  The tail the pancreas is not well seen due to overlying bowel gas.  The pancreas appears otherwise unremarkable.  Spleen:  Measures 8 cm in length and appears normal.  Right Kidney:  Measures 11.9 cm in length and appears normal.  Left Kidney:   Measures 10.9 cm in length and appears normal.  Abdominal aorta:  No aneurysm identified.  IMPRESSION: 1.  Essentially stable hyperechoic lesion in the right hepatic lobe anteriorly, not entirely specific but likely a small hemangioma or related to underlying fatty infiltration. 2.  Coarsened echotexture and echogenicity suggest diffuse hepatic steatosis, similar to prior exam. 3.  The tail of the pancreas is not visualized on today's exam due to overlying bowel gas.  Original Report Authenticated By: Carron Curie, M.D.      Assessment & Plan:  See problem list. Medications and labs reviewed today.  Noncompliance remains barrier to better health care

## 2012-09-11 NOTE — Patient Instructions (Addendum)
It was good to see you today. Please take your medications and insulin as prescribed!!! You need to call us between appointments if you have problems with your medications Medications reviewed, no changes at this time. Please keep scheduled followup in Feb 2014 for diabetes mellitus and blood pressure check, call sooner if problems.

## 2012-09-21 ENCOUNTER — Telehealth: Payer: Self-pay | Admitting: Internal Medicine

## 2012-09-21 NOTE — Telephone Encounter (Signed)
Ok to cancel refer to [facility] hepatology - thanks

## 2012-09-21 NOTE — Telephone Encounter (Signed)
Maggie from [facility] said she spoke with pt who said she doesn't need appt, that her test results was fine. Please advise.

## 2012-10-01 ENCOUNTER — Telehealth: Payer: Self-pay | Admitting: Internal Medicine

## 2012-10-01 MED ORDER — INSULIN NPH ISOPHANE & REGULAR (70-30) 100 UNIT/ML ~~LOC~~ SUSP: SUBCUTANEOUS | Status: DC

## 2012-10-01 NOTE — Telephone Encounter (Signed)
Due to patients insurance she needs [place] to call in Humulin or Humalog instead of the Novolin to [employer]

## 2012-10-01 NOTE — Telephone Encounter (Signed)
Ok to change the novolin 70/30 to Humulin 70/30 (o/w same rx) - done erx to [facility]

## 2012-10-02 NOTE — Telephone Encounter (Signed)
Patient informed. 

## 2012-12-06 LAB — HM DIABETES EYE EXAM

## 2012-12-07 ENCOUNTER — Encounter: Payer: Self-pay | Admitting: Internal Medicine

## 2012-12-17 LAB — HM MAMMOGRAPHY: HM Mammogram: NEGATIVE

## 2012-12-18 ENCOUNTER — Encounter: Payer: Self-pay | Admitting: Internal Medicine

## 2012-12-28 ENCOUNTER — Ambulatory Visit (INDEPENDENT_AMBULATORY_CARE_PROVIDER_SITE_OTHER): Payer: Medicare Other | Admitting: Internal Medicine

## 2012-12-28 ENCOUNTER — Encounter: Payer: Self-pay | Admitting: Internal Medicine

## 2012-12-28 ENCOUNTER — Other Ambulatory Visit (INDEPENDENT_AMBULATORY_CARE_PROVIDER_SITE_OTHER): Payer: Medicare Other

## 2012-12-28 VITALS — BP 180/72 | HR 69 | Temp 98.3°F | Wt 158.1 lb

## 2012-12-28 DIAGNOSIS — E109 Type 1 diabetes mellitus without complications: Secondary | ICD-10-CM

## 2012-12-28 DIAGNOSIS — I10 Essential (primary) hypertension: Secondary | ICD-10-CM

## 2012-12-28 DIAGNOSIS — E785 Hyperlipidemia, unspecified: Secondary | ICD-10-CM

## 2012-12-28 LAB — HEMOGLOBIN A1C: Hgb A1c MFr Bld: 7.1 % — ABNORMAL HIGH (ref 4.6–6.5)

## 2012-12-28 NOTE — Assessment & Plan Note (Signed)
Variable statin compliance - on prava as "afraid" of simva and atorva Check lipids annually and adjust as needed

## 2012-12-28 NOTE — Assessment & Plan Note (Signed)
BP Readings from Last 3 Encounters:  12/28/12 180/72  09/11/12 164/72  08/28/12 208/78  uncontrolled The current medical regimen is not effective; limited by poor insight and noncompliance >> $$ reviewed importance of compliance - no evidence of secondary organ damage or symptoms on exam today

## 2012-12-28 NOTE — Progress Notes (Signed)
Subjective:    Patient ID: [name] [name], female    DOB: [birth year], 72 y.o.   MRN: [id number]  HPI  Here for follow up - reviewed chronic medical issues: Noncompliance remains largest barrier to care  dyslipidemia -  continued variable compliance with pravachol- declines lipitor or zocor due to concern for possible side effects -  DM2 - still taking insulin only once daily -denies hypoglycemic symptoms or events -  reports improved dietary discretion - Follows with optho regularly  HTN - reports variable compliance with medical treatment and no changes in medication dose or frequency.  denies adverse side effects related to current therapy. no cough or ankle swelling - no chest pain or headache   Hep B carrier state, chronic increase LFTs, cirrhosis progression (only fatty liver changes on [place] 2011) - has not yet seen hepatology, missed 04/2012 appt as concerned about cost - denies swelling abdomen or legs  Past Medical History  Diagnosis Date  . HYPERTENSION   . OSTEOPENIA   . Unspecified vitamin D deficiency   . Headache   . POSTMENOPAUSAL STATUS   . Hepatitis B carrier     05/2009: neg Hep C; Hep B: core pos, Surf neg; fatty liver US 8/10 - 7/13  . HYPERLIPIDEMIA   . GLAUCOMA   . GERD   . DIABETES MELLITUS, TYPE I   . DEPRESSION      Review of Systems  Constitutional: Negative for fatigue and unexpected weight change.  Respiratory: Negative for cough and shortness of breath.   Cardiovascular: Negative for chest pain and palpitations.  Gastrointestinal: Negative for blood in stool and abdominal distention.  Neurological: Negative for headaches.       Objective:   Physical Exam  BP 180/72  Pulse 69  Temp(Src) 98.3 F (36.8 C) (Oral)  Wt 158 lb 1.9 oz (71.723 kg)  BMI 26.31 kg/m2  SpO2 97%   Wt Readings from Last 3 Encounters:  12/28/12 158 lb 1.9 oz (71.723 kg)  09/11/12 158 lb 12.8 oz (72.031 kg)  08/28/12 162 lb 6.4 oz (73.664 kg)   Constitutional: She  appears well-developed and well-nourished. No distress.  Neck: Normal range of motion. Neck supple. No JVD present. No thyromegaly present.  Cardiovascular: Normal rate, regular rhythm and normal heart sounds.  No murmur heard. No BLE edema. Pulmonary/Chest: Effort normal and breath sounds normal. No respiratory distress. She has no wheezes.  Abd: protuberant, NTND, +BS, no ascites or definitive HSM Neurological: She is alert and oriented to person, place, and time. No cranial nerve deficit. Coordination normal.  Psychiatric: She has a normal mood and affect. Her behavior is normal. Judgment and thought content normal.   Lab Results  Component Value Date   WBC 3.4* 08/28/2012   HGB 10.2* 08/28/2012   HCT 29.9* 08/28/2012   PLT 112.0* 08/28/2012   CHOL 312* 04/27/2012   TRIG 150.0* 04/27/2012   HDL 64.90 04/27/2012   LDLDIRECT 195.7 04/27/2012   ALT 31 08/28/2012   AST 50* 08/28/2012   NA 140 08/28/2012   K 3.8 08/28/2012   CL 105 08/28/2012   CREATININE 1.1 08/28/2012   BUN 18 08/28/2012   CO2 30 08/28/2012   TSH 1.03 04/22/2011   HGBA1C 7.6* 08/28/2012   MICROALBUR 174.7* 01/03/2008   US Abdomen Complete  05/02/2012  *RADIOLOGY REPORT*  Clinical Data:  Abdominal pain.  Prior cholecystectomy.  COMPLETE ABDOMINAL ULTRASOUND  Comparison:  Multiple exams, including 02/03/2010  Findings:  Gallbladder:  Surgically absent.  Common bile duct:  Measures 4 mm in diameter, within normal limits.  Liver:  Hyperechoic and slightly heterogeneous, favoring diffuse fatty infiltration.  A hyperechoic focus in the right hepatic lobe is ill-defined and currently measures 10 mm (formerly the same by my measurements).  IVC:  Unremarkable.  Pancreas:  The tail the pancreas is not well seen due to overlying bowel gas.  The pancreas appears otherwise unremarkable.  Spleen:  Measures 8 cm in length and appears normal.  Right Kidney:  Measures 11.9 cm in length and appears normal.  Left Kidney:  Measures 10.9 cm in  length and appears normal.  Abdominal aorta:  No aneurysm identified.  IMPRESSION: 1.  Essentially stable hyperechoic lesion in the right hepatic lobe anteriorly, not entirely specific but likely a small hemangioma or related to underlying fatty infiltration. 2.  Coarsened echotexture and echogenicity suggest diffuse hepatic steatosis, similar to prior exam. 3.  The tail of the pancreas is not visualized on today's exam due to overlying bowel gas.  Original Report Authenticated By: Carron Curie, M.D.      Assessment & Plan:  See problem list. Medications and labs reviewed today.  Noncompliance remains barrier to better health care

## 2012-12-28 NOTE — Patient Instructions (Signed)
It was good to see you today. Please take ALL of your medications and insulin as prescribed!!! You need to call us between appointments if you have problems with your medications Medications reviewed, no changes at this time. Test(s) ordered today. Your results will be released to [place] (or called to you) after review, usually within 72hours after test completion. If any changes need to be made, you will be notified at that same time. Please schedule followup in 3-4 months for blood pressure and diabetes mellitus check, call sooner if problems.

## 2012-12-28 NOTE — Assessment & Plan Note (Signed)
uses 70/30 but takes only qd, not bid despite multiple medications and instructions regarding same Noncompliance reviewed and need for improved dose compliance - pt agrees to try  Cont to follow with eye specialists, on ACEI+pravastatin, ASA 81 Check a1c now Lab Results  Component Value Date   HGBA1C 7.6* 08/28/2012

## 2013-02-25 ENCOUNTER — Other Ambulatory Visit: Payer: Self-pay | Admitting: *Deleted

## 2013-02-25 DIAGNOSIS — I10 Essential (primary) hypertension: Secondary | ICD-10-CM

## 2013-02-25 DIAGNOSIS — E109 Type 1 diabetes mellitus without complications: Secondary | ICD-10-CM

## 2013-02-25 MED ORDER — LISINOPRIL 40 MG PO TABS: 40.0000 mg | ORAL_TABLET | Freq: Every day | ORAL | Status: DC

## 2013-02-25 MED ORDER — HYDROCHLOROTHIAZIDE 25 MG PO TABS: 25.0000 mg | ORAL_TABLET | Freq: Every day | ORAL | Status: DC

## 2013-04-26 ENCOUNTER — Ambulatory Visit (INDEPENDENT_AMBULATORY_CARE_PROVIDER_SITE_OTHER): Payer: Medicare Other | Admitting: Internal Medicine

## 2013-04-26 ENCOUNTER — Other Ambulatory Visit (INDEPENDENT_AMBULATORY_CARE_PROVIDER_SITE_OTHER): Payer: Medicare Other

## 2013-04-26 ENCOUNTER — Encounter: Payer: Self-pay | Admitting: Internal Medicine

## 2013-04-26 VITALS — BP 182/70 | HR 73 | Temp 98.1°F | Wt 156.8 lb

## 2013-04-26 DIAGNOSIS — E785 Hyperlipidemia, unspecified: Secondary | ICD-10-CM

## 2013-04-26 DIAGNOSIS — E1049 Type 1 diabetes mellitus with other diabetic neurological complication: Secondary | ICD-10-CM

## 2013-04-26 DIAGNOSIS — E109 Type 1 diabetes mellitus without complications: Secondary | ICD-10-CM

## 2013-04-26 DIAGNOSIS — I10 Essential (primary) hypertension: Secondary | ICD-10-CM

## 2013-04-26 LAB — LIPID PANEL
Cholesterol: 301 mg/dL — ABNORMAL HIGH (ref 0–200)
Total CHOL/HDL Ratio: 4
Triglycerides: 134 mg/dL (ref 0.0–149.0)
VLDL: 26.8 mg/dL (ref 0.0–40.0)

## 2013-04-26 LAB — BASIC METABOLIC PANEL
BUN: 25 mg/dL — ABNORMAL HIGH (ref 6–23)
CO2: 30 mEq/L (ref 19–32)
Calcium: 9.2 mg/dL (ref 8.4–10.5)
Creatinine, Ser: 1.5 mg/dL — ABNORMAL HIGH (ref 0.4–1.2)

## 2013-04-26 LAB — HEMOGLOBIN A1C: Hgb A1c MFr Bld: 7 % — ABNORMAL HIGH (ref 4.6–6.5)

## 2013-04-26 MED ORDER — METOPROLOL SUCCINATE ER 50 MG PO TB24: 50.0000 mg | ORAL_TABLET | Freq: Every day | ORAL | Status: DC

## 2013-04-26 NOTE — Progress Notes (Signed)
Subjective:    Patient ID: [name] [name], female    DOB: 04-Aug-1941, 72 y.o.   MRN: [id number]  HPI  Here for follow up - reviewed chronic medical issues: Noncompliance remains largest barrier to care  dyslipidemia -  continued variable compliance with pravachol- declines lipitor or zocor due to concern for possible side effects -  DM2 - still taking insulin only once daily -denies hypoglycemic symptoms or events -  reports improved dietary discretion - reports she follows with optho regularly  HTN - reports variable compliance with medical treatment and no changes in medication dose or frequency.  denies adverse side effects related to current therapy. no cough or ankle swelling - no chest pain or headache   Hep B carrier state, chronic increase LFTs, cirrhosis progression (only fatty liver changes on [place] 2011) - has not yet seen hepatology, missed 04/2012 appt as concerned about cost - denies swelling abdomen or legs  Past Medical History  Diagnosis Date  . HYPERTENSION   . OSTEOPENIA   . Unspecified vitamin D deficiency   . Headache(784.0)   . POSTMENOPAUSAL STATUS   . Hepatitis B carrier     05/2009: neg Hep C; Hep B: core pos, Surf neg; fatty liver US 8/10 - 7/13  . HYPERLIPIDEMIA   . GLAUCOMA   . GERD   . DIABETES MELLITUS, TYPE I   . DEPRESSION      Review of Systems  Constitutional: Negative for fatigue and unexpected weight change.  Respiratory: Negative for cough and shortness of breath.   Cardiovascular: Negative for chest pain and palpitations.  Gastrointestinal: Negative for blood in stool and abdominal distention.  Neurological: Negative for headaches.       Objective:   Physical Exam  BP 182/70  Pulse 73  Temp(Src) 98.1 F (36.7 C) (Oral)  Wt 156 lb 12.8 oz (71.124 kg)  BMI 26.09 kg/m2  SpO2 98%   Wt Readings from Last 3 Encounters:  04/26/13 156 lb 12.8 oz (71.124 kg)  12/28/12 158 lb 1.9 oz (71.723 kg)  09/11/12 158 lb 12.8 oz (72.031 kg)    Constitutional: She appears well-developed and well-nourished. No distress.  Neck: Normal range of motion. Neck supple. No JVD present. No thyromegaly present.  Cardiovascular: Normal rate, regular rhythm and normal heart sounds.  No murmur heard. No BLE edema. Pulmonary/Chest: Effort normal and breath sounds normal. No respiratory distress. She has no wheezes.  Abd: protuberant, NTND, +BS, no ascites or definitive HSM Neurological: She is alert and oriented to person, place, and time. No cranial nerve deficit. Coordination normal.  Psychiatric: She has a normal mood and affect. Her behavior is normal. Judgment and thought content normal.   Lab Results  Component Value Date   WBC 3.4* 08/28/2012   HGB 10.2* 08/28/2012   HCT 29.9* 08/28/2012   PLT 112.0* 08/28/2012   CHOL 312* 04/27/2012   TRIG 150.0* 04/27/2012   HDL 64.90 04/27/2012   LDLDIRECT 195.7 04/27/2012   ALT 31 08/28/2012   AST 50* 08/28/2012   NA 140 08/28/2012   K 3.8 08/28/2012   CL 105 08/28/2012   CREATININE 1.1 08/28/2012   BUN 18 08/28/2012   CO2 30 08/28/2012   TSH 1.03 04/22/2011   HGBA1C 7.1* 12/28/2012   MICROALBUR 174.7* 01/03/2008   US Abdomen Complete  05/02/2012  *RADIOLOGY REPORT*  Clinical Data:  Abdominal pain.  Prior cholecystectomy.  COMPLETE ABDOMINAL ULTRASOUND  Comparison:  Multiple exams, including 02/03/2010  Findings:  Gallbladder:  Surgically  absent.  Common bile duct:  Measures 4 mm in diameter, within normal limits.  Liver:  Hyperechoic and slightly heterogeneous, favoring diffuse fatty infiltration.  A hyperechoic focus in the right hepatic lobe is ill-defined and currently measures 10 mm (formerly the same by my measurements).  IVC:  Unremarkable.  Pancreas:  The tail the pancreas is not well seen due to overlying bowel gas.  The pancreas appears otherwise unremarkable.  Spleen:  Measures 8 cm in length and appears normal.  Right Kidney:  Measures 11.9 cm in length and appears normal.  Left Kidney:   Measures 10.9 cm in length and appears normal.  Abdominal aorta:  No aneurysm identified.  IMPRESSION: 1.  Essentially stable hyperechoic lesion in the right hepatic lobe anteriorly, not entirely specific but likely a small hemangioma or related to underlying fatty infiltration. 2.  Coarsened echotexture and echogenicity suggest diffuse hepatic steatosis, similar to prior exam. 3.  The tail of the pancreas is not visualized on today's exam due to overlying bowel gas.  Original Report Authenticated By: Carron Curie, M.D.      Assessment & Plan:  See problem list. Medications and labs reviewed today.  Noncompliance remains barrier to better health care

## 2013-04-26 NOTE — Assessment & Plan Note (Signed)
BP Readings from Last 3 Encounters:  04/26/13 182/70  12/28/12 180/72  09/11/12 164/72  uncontrolled - titrate dose now The current medical regimen is not effective; limited by poor insight and noncompliance >> $$ reviewed importance of compliance - no evidence of secondary organ damage or symptoms on exam today

## 2013-04-26 NOTE — Patient Instructions (Signed)
It was good to see you today. Please take ALL of your medications and insulin as prescribed!!! You need to call us between appointments if you have problems with your medications Medications reviewed, stop labetalol and start metoprolol once daily for blood pressure. Your prescription(s) have been submitted to your pharmacy. Please take as directed and contact our office if you believe you are having problem(s) with the medication(s). Test(s) ordered today. Your results will be released to [place] (or called to you) after review, usually within 72hours after test completion. If any changes need to be made, you will be notified at that same time. Please schedule followup in 4 months for blood pressure and diabetes mellitus check, call sooner if problems.

## 2013-04-26 NOTE — Assessment & Plan Note (Signed)
Variable statin compliance - rx'd prava as "afraid" of simva and atorva Check lipids annually and adjust as needed

## 2013-04-26 NOTE — Assessment & Plan Note (Signed)
uses 70/30 but takes only qd, not bid despite multiple medications and instructions regarding same Noncompliance reviewed and need for improved dose compliance - pt agrees to try  Cont to follow with eye specialists, on ACEI+pravastatin, ASA 81 Check a1c now Lab Results  Component Value Date   HGBA1C 7.1* 12/28/2012

## 2013-08-13 ENCOUNTER — Other Ambulatory Visit: Payer: Self-pay | Admitting: *Deleted

## 2013-08-13 DIAGNOSIS — I10 Essential (primary) hypertension: Secondary | ICD-10-CM

## 2013-08-13 MED ORDER — AMLODIPINE BESYLATE 10 MG PO TABS: 10.0000 mg | ORAL_TABLET | Freq: Every day | ORAL | Status: DC

## 2013-08-30 ENCOUNTER — Ambulatory Visit: Payer: Medicare Other | Admitting: Internal Medicine

## 2013-09-03 ENCOUNTER — Other Ambulatory Visit (INDEPENDENT_AMBULATORY_CARE_PROVIDER_SITE_OTHER): Payer: Medicare Other

## 2013-09-03 ENCOUNTER — Encounter: Payer: Self-pay | Admitting: Internal Medicine

## 2013-09-03 ENCOUNTER — Ambulatory Visit (INDEPENDENT_AMBULATORY_CARE_PROVIDER_SITE_OTHER): Payer: Medicare Other | Admitting: Internal Medicine

## 2013-09-03 VITALS — BP 220/80 | HR 67 | Temp 98.5°F | Wt 154.4 lb

## 2013-09-03 DIAGNOSIS — Z23 Encounter for immunization: Secondary | ICD-10-CM

## 2013-09-03 DIAGNOSIS — E1165 Type 2 diabetes mellitus with hyperglycemia: Secondary | ICD-10-CM

## 2013-09-03 DIAGNOSIS — E785 Hyperlipidemia, unspecified: Secondary | ICD-10-CM

## 2013-09-03 DIAGNOSIS — E1129 Type 2 diabetes mellitus with other diabetic kidney complication: Secondary | ICD-10-CM

## 2013-09-03 DIAGNOSIS — I10 Essential (primary) hypertension: Secondary | ICD-10-CM

## 2013-09-03 DIAGNOSIS — D61818 Other pancytopenia: Secondary | ICD-10-CM

## 2013-09-03 DIAGNOSIS — B181 Chronic viral hepatitis B without delta-agent: Secondary | ICD-10-CM

## 2013-09-03 LAB — CBC WITH DIFFERENTIAL/PLATELET
Basophils Absolute: 0 10*3/uL (ref 0.0–0.1)
Basophils Relative: 0.7 % (ref 0.0–3.0)
Eosinophils Absolute: 0.1 10*3/uL (ref 0.0–0.7)
Eosinophils Relative: 1.7 % (ref 0.0–5.0)
Hemoglobin: 9.5 g/dL — ABNORMAL LOW (ref 12.0–15.0)
Lymphocytes Relative: 21.8 % (ref 12.0–46.0)
MCHC: 34.6 g/dL (ref 30.0–36.0)
MCV: 90.4 fl (ref 78.0–100.0)
Monocytes Absolute: 0.3 10*3/uL (ref 0.1–1.0)
Neutro Abs: 2.2 10*3/uL (ref 1.4–7.7)
Neutrophils Relative %: 67.1 % (ref 43.0–77.0)
RBC: 3.04 Mil/uL — ABNORMAL LOW (ref 3.87–5.11)
RDW: 14.3 % (ref 11.5–14.6)
WBC: 3.2 10*3/uL — ABNORMAL LOW (ref 4.5–10.5)

## 2013-09-03 LAB — HEPATIC FUNCTION PANEL
Albumin: 2.3 g/dL — ABNORMAL LOW (ref 3.5–5.2)
Alkaline Phosphatase: 114 U/L (ref 39–117)
Bilirubin, Direct: 0.1 mg/dL (ref 0.0–0.3)
Total Bilirubin: 0.4 mg/dL (ref 0.3–1.2)
Total Protein: 6.3 g/dL (ref 6.0–8.3)

## 2013-09-03 LAB — BASIC METABOLIC PANEL
CO2: 28 mEq/L (ref 19–32)
Chloride: 108 mEq/L (ref 96–112)
GFR: 42.6 mL/min — ABNORMAL LOW (ref >60.00)
Sodium: 142 mEq/L (ref 135–145)

## 2013-09-03 LAB — HEMOGLOBIN A1C: Hgb A1c MFr Bld: 6.5 % (ref 4.6–6.5)

## 2013-09-03 MED ORDER — SPIRONOLACTONE 25 MG PO TABS: 25.0000 mg | ORAL_TABLET | Freq: Every day | ORAL | Status: DC

## 2013-09-03 MED ORDER — PRAVASTATIN SODIUM 80 MG PO TABS: 80.0000 mg | ORAL_TABLET | Freq: Every day | ORAL | Status: DC

## 2013-09-03 NOTE — Assessment & Plan Note (Signed)
Variable statin compliance - rx'd prava in 2013 as "afraid" of simva and atorva, but not taking same Strongly encouraged compliance - education on same provided at length Check lipids annually and adjust as needed

## 2013-09-03 NOTE — Progress Notes (Signed)
Subjective:    Patient ID: [name] [name], female    DOB: 09/30/1941, 72 y.o.   MRN: [id number]  HPI Here for follow up - reviewed chronic medical issues: Noncompliance remains largest barrier to care  dyslipidemia -  continued variable compliance with pravachol- declines lipitor or zocor due to concern for possible side effects -  DM2 - still taking insulin only once daily -denies hypoglycemic symptoms or events -  reports improved dietary discretion - reports she follows with optho regularly  HTN - reports variable compliance with medical treatment and no changes in medication dose or frequency.  denies adverse side effects related to current therapy. no cough or ankle swelling - no chest pain or headache   Hep B carrier state, chronic increase LFTs, cirrhosis progression (only fatty liver changes on [place] 2011) - has not yet seen hepatology, missed 04/2012 appt as concerned about cost - denies swelling abdomen or legs  Past Medical History  Diagnosis Date  . HYPERTENSION   . OSTEOPENIA   . Unspecified vitamin D deficiency   . Headache(784.0)   . POSTMENOPAUSAL STATUS   . Hepatitis B carrier     05/2009: neg Hep C; Hep B: core pos, Surf neg; fatty liver US 8/10 - 7/13  . HYPERLIPIDEMIA   . GLAUCOMA   . GERD   . DIABETES MELLITUS, TYPE I   . DEPRESSION     Review of Systems  Constitutional: Negative for fatigue and unexpected weight change.  Respiratory: Negative for cough and shortness of breath.   Cardiovascular: Negative for chest pain and palpitations.  Gastrointestinal: Negative for blood in stool and abdominal distention.  Neurological: Negative for headaches.       Objective:   Physical Exam BP 220/80  Pulse 67  Temp(Src) 98.5 F (36.9 C) (Oral)  Wt 154 lb 6.4 oz (70.035 kg)  SpO2 97%   Wt Readings from Last 3 Encounters:  09/03/13 154 lb 6.4 oz (70.035 kg)  04/26/13 156 lb 12.8 oz (71.124 kg)  12/28/12 158 lb 1.9 oz (71.723 kg)   Constitutional: She  appears well-developed and well-nourished. No distress.  Neck: Normal range of motion. Neck supple. No JVD present. No thyromegaly present.  Cardiovascular: Normal rate, regular rhythm and normal heart sounds.  No murmur heard. No BLE edema. Pulmonary/Chest: Effort normal and breath sounds normal. No respiratory distress. She has no wheezes.  Abd: protuberant, NTND, +BS, no ascites or definitive HSM Neurological: She is alert and oriented to person, place, and time. No cranial nerve deficit. Coordination normal.  Psychiatric: She has a normal mood and affect. Her behavior is normal. Judgment and thought content normal.   Lab Results  Component Value Date   WBC 3.4* 08/28/2012   HGB 10.2* 08/28/2012   HCT 29.9* 08/28/2012   PLT 112.0* 08/28/2012   CHOL 301* 04/26/2013   TRIG 134.0 04/26/2013   HDL 78.10 04/26/2013   LDLDIRECT 196.2 04/26/2013   ALT 31 08/28/2012   AST 50* 08/28/2012   NA 140 04/26/2013   K 4.2 04/26/2013   CL 102 04/26/2013   CREATININE 1.5* 04/26/2013   BUN 25* 04/26/2013   CO2 30 04/26/2013   TSH 1.03 04/22/2011   HGBA1C 7.0* 04/26/2013   MICROALBUR 174.7* 01/03/2008   US Abdomen Complete  05/02/2012  *RADIOLOGY REPORT*  Clinical Data:  Abdominal pain.  Prior cholecystectomy.  COMPLETE ABDOMINAL ULTRASOUND  Comparison:  Multiple exams, including 02/03/2010  Findings:  Gallbladder:  Surgically absent.  Common bile duct:  Measures  4 mm in diameter, within normal limits.  Liver:  Hyperechoic and slightly heterogeneous, favoring diffuse fatty infiltration.  A hyperechoic focus in the right hepatic lobe is ill-defined and currently measures 10 mm (formerly the same by my measurements).  IVC:  Unremarkable.  Pancreas:  The tail the pancreas is not well seen due to overlying bowel gas.  The pancreas appears otherwise unremarkable.  Spleen:  Measures 8 cm in length and appears normal.  Right Kidney:  Measures 11.9 cm in length and appears normal.  Left Kidney:  Measures 10.9 cm in length  and appears normal.  Abdominal aorta:  No aneurysm identified.  IMPRESSION: 1.  Essentially stable hyperechoic lesion in the right hepatic lobe anteriorly, not entirely specific but likely a small hemangioma or related to underlying fatty infiltration. 2.  Coarsened echotexture and echogenicity suggest diffuse hepatic steatosis, similar to prior exam. 3.  The tail of the pancreas is not visualized on today's exam due to overlying bowel gas.  Original Report Authenticated By: Carron Curie, M.D.      Assessment & Plan:  See problem list. Medications and labs reviewed today.  Noncompliance remains barrier to better health care

## 2013-09-03 NOTE — Assessment & Plan Note (Signed)
BP Readings from Last 3 Encounters:  09/03/13 220/80  04/26/13 182/70  12/28/12 180/72  uncontrolled - The current medical regimen is not effective; limited by poor insight and ? poor compliance >> $$ reviewed importance of compliance - no evidence of secondary organ damage or symptoms on exam today but check Bmet Add hydralazine and refer to renal

## 2013-09-03 NOTE — Assessment & Plan Note (Signed)
uses 70/30 but takes only qd, not bid despite multiple medications and instructions regarding same Noncompliance reviewed Cont to follow with eye specialists, on ACEI+pravastatin, ASA 81 Check a1c now Lab Results  Component Value Date   HGBA1C 7.0* 04/26/2013

## 2013-09-03 NOTE — Patient Instructions (Signed)
It was good to see you today.  Please take ALL of your medications and insulin as prescribed!!! You need to call us between appointments if you have problems with your medications  Medications reviewed, start spironolactone once daily for blood pressure. Take this in addition to all other blood pressure medications.   Also restart cholesterol medicine pravastatin - take one every day  Your prescription(s) have been submitted to your mail order pharmacy. Please take as directed and contact our office if you believe you are having problem(s) with the medication(s).  Test(s) ordered today. Your results will be released to [place] (or called to you) after review, usually within 72hours after test completion. If any changes need to be made, you will be notified at that same time.  we'll make referral to kidney specialist [facility] [place] with your blood pressure control . Our office will contact you regarding appointment(s) once made.  Please schedule followup in 4 months for blood pressure and diabetes mellitus check, call sooner if problems.

## 2013-09-03 NOTE — Progress Notes (Signed)
Pre-visit discussion using our clinic review tool. No additional management support is needed unless otherwise documented below in the visit note.  

## 2013-09-03 NOTE — Assessment & Plan Note (Signed)
Mild, stable increase LFTs Fatty liver on abdominal ultrasound August 2010; stable 04/2012 -  Suspect cirrhosis as also associated with pancytopenia - recheck CBC now reviewed August 2010 Hep b/c serology > hep C negative but hep B. core positive, surface antibody negative  Reviewed 12/2011 repeat labs> Hep B carrier status Pt missed hepatology eval 05/17/12  - no needed for specialist at this time [facility] following only HepC)

## 2013-09-04 ENCOUNTER — Encounter: Payer: Self-pay | Admitting: *Deleted

## 2013-11-05 ENCOUNTER — Other Ambulatory Visit: Payer: Self-pay | Admitting: *Deleted

## 2013-11-05 DIAGNOSIS — E109 Type 1 diabetes mellitus without complications: Secondary | ICD-10-CM

## 2013-11-05 DIAGNOSIS — I10 Essential (primary) hypertension: Secondary | ICD-10-CM

## 2013-11-05 MED ORDER — LISINOPRIL 40 MG PO TABS: 40.0000 mg | ORAL_TABLET | Freq: Every day | ORAL | Status: DC

## 2013-11-05 MED ORDER — HYDROCHLOROTHIAZIDE 25 MG PO TABS: 25.0000 mg | ORAL_TABLET | Freq: Every day | ORAL | Status: DC

## 2013-11-05 MED ORDER — AMLODIPINE BESYLATE 10 MG PO TABS: 10.0000 mg | ORAL_TABLET | Freq: Every day | ORAL | Status: DC

## 2013-11-05 MED ORDER — METOPROLOL SUCCINATE ER 50 MG PO TB24: 50.0000 mg | ORAL_TABLET | Freq: Every day | ORAL | Status: DC

## 2013-12-04 ENCOUNTER — Ambulatory Visit: Payer: Medicare Other | Admitting: Internal Medicine

## 2013-12-05 ENCOUNTER — Encounter: Payer: Self-pay | Admitting: Internal Medicine

## 2013-12-05 ENCOUNTER — Other Ambulatory Visit (INDEPENDENT_AMBULATORY_CARE_PROVIDER_SITE_OTHER): Payer: Medicare HMO

## 2013-12-05 ENCOUNTER — Ambulatory Visit (INDEPENDENT_AMBULATORY_CARE_PROVIDER_SITE_OTHER): Payer: Medicare HMO | Admitting: Internal Medicine

## 2013-12-05 VITALS — BP 192/78 | HR 73 | Temp 98.8°F | Wt 147.8 lb

## 2013-12-05 DIAGNOSIS — B181 Chronic viral hepatitis B without delta-agent: Secondary | ICD-10-CM

## 2013-12-05 DIAGNOSIS — N184 Chronic kidney disease, stage 4 (severe): Secondary | ICD-10-CM

## 2013-12-05 DIAGNOSIS — I10 Essential (primary) hypertension: Secondary | ICD-10-CM

## 2013-12-05 DIAGNOSIS — N183 Chronic kidney disease, stage 3 unspecified: Secondary | ICD-10-CM

## 2013-12-05 DIAGNOSIS — E1129 Type 2 diabetes mellitus with other diabetic kidney complication: Secondary | ICD-10-CM

## 2013-12-05 DIAGNOSIS — I129 Hypertensive chronic kidney disease with stage 1 through stage 4 chronic kidney disease, or unspecified chronic kidney disease: Secondary | ICD-10-CM

## 2013-12-05 DIAGNOSIS — E1165 Type 2 diabetes mellitus with hyperglycemia: Principal | ICD-10-CM

## 2013-12-05 LAB — HEPATIC FUNCTION PANEL
ALT: 28 U/L (ref 0–35)
AST: 53 U/L — ABNORMAL HIGH (ref 0–37)
Albumin: 2.3 g/dL — ABNORMAL LOW (ref 3.5–5.2)
Alkaline Phosphatase: 148 U/L — ABNORMAL HIGH (ref 39–117)
BILIRUBIN DIRECT: 0.1 mg/dL (ref 0.0–0.3)
BILIRUBIN TOTAL: 0.7 mg/dL (ref 0.3–1.2)
Total Protein: 6.7 g/dL (ref 6.0–8.3)

## 2013-12-05 LAB — BASIC METABOLIC PANEL
BUN: 36 mg/dL — AB (ref 6–23)
CHLORIDE: 106 meq/L (ref 96–112)
CO2: 30 mEq/L (ref 19–32)
Calcium: 9 mg/dL (ref 8.4–10.5)
Creatinine, Ser: 2.5 mg/dL — ABNORMAL HIGH (ref 0.4–1.2)
GFR: 24.57 mL/min — ABNORMAL LOW (ref >60.00)
GLUCOSE: 100 mg/dL — AB (ref 70–99)
POTASSIUM: 3.5 meq/L (ref 3.5–5.1)
Sodium: 143 mEq/L (ref 135–145)

## 2013-12-05 LAB — CBC WITH DIFFERENTIAL/PLATELET
Basophils Absolute: 0 10*3/uL (ref 0.0–0.1)
Basophils Relative: 0.5 % (ref 0.0–3.0)
EOS ABS: 0.1 10*3/uL (ref 0.0–0.7)
EOS PCT: 1.5 % (ref 0.0–5.0)
HCT: 28.4 % — ABNORMAL LOW (ref 36.0–46.0)
Hemoglobin: 9.5 g/dL — ABNORMAL LOW (ref 12.0–15.0)
LYMPHS PCT: 18.5 % (ref 12.0–46.0)
Lymphs Abs: 0.7 10*3/uL (ref 0.7–4.0)
MCHC: 33.4 g/dL (ref 30.0–36.0)
MCV: 92.9 fl (ref 78.0–100.0)
Monocytes Absolute: 0.4 10*3/uL (ref 0.1–1.0)
Monocytes Relative: 9.3 % (ref 3.0–12.0)
NEUTROS PCT: 70.2 % (ref 43.0–77.0)
Neutro Abs: 2.7 10*3/uL (ref 1.4–7.7)
Platelets: 131 10*3/uL — ABNORMAL LOW (ref 150.0–400.0)
RBC: 3.05 Mil/uL — AB (ref 3.87–5.11)
RDW: 14 % (ref 11.5–14.6)
WBC: 3.9 10*3/uL — AB (ref 4.5–10.5)

## 2013-12-05 LAB — HEMOGLOBIN A1C: Hgb A1c MFr Bld: 6.1 % (ref 4.6–6.5)

## 2013-12-05 NOTE — Assessment & Plan Note (Signed)
Aggressive disease, related to uncontrolled hypertension, also diabetes mellitus Discussed need for evaluation by renal, pt will consider - ?same as her husband sees

## 2013-12-05 NOTE — Progress Notes (Signed)
Pre-visit discussion using our clinic review tool. No additional management support is needed unless otherwise documented below in the visit note.  

## 2013-12-05 NOTE — Progress Notes (Signed)
Subjective:    Patient ID: [name] [name], female    DOB: 06/03/1941, 73 y.o.   MRN: [id number]  HPI Here for follow up - reviewed chronic medical issues: Noncompliance remains largest barrier to care  dyslipidemia -  continued variable compliance with pravachol- declines lipitor or zocor due to concern for possible side effects -  DM2 - still taking insulin only once daily -denies hypoglycemic symptoms or events -  reports improved dietary discretion - reports she follows with optho regularly  HTN - reports variable compliance with medical treatment and no changes in medication dose or frequency.  denies adverse side effects related to current therapy. no cough or ankle swelling - no chest pain or headache   Hep B carrier state, chronic increase LFTs, cirrhosis progression (only fatty liver changes on [place] 2011) - has not yet seen hepatology, missed 04/2012 appt as concerned about cost - denies swelling abdomen or legs  Past Medical History  Diagnosis Date  . HYPERTENSION   . OSTEOPENIA   . Unspecified vitamin D deficiency   . Headache(784.0)   . POSTMENOPAUSAL STATUS   . Hepatitis B carrier     05/2009: neg Hep C; Hep B: core pos, Surf neg; fatty liver US 8/10 - 7/13  . HYPERLIPIDEMIA   . GLAUCOMA   . GERD   . DIABETES MELLITUS, TYPE I   . DEPRESSION     Review of Systems  Constitutional: Negative for fatigue and unexpected weight change.  Respiratory: Negative for cough and shortness of breath.   Cardiovascular: Negative for chest pain and palpitations.  Gastrointestinal: Negative for blood in stool and abdominal distention.  Neurological: Negative for headaches.       Objective:   Physical Exam BP 192/78  Pulse 73  Temp(Src) 98.8 F (37.1 C) (Oral)  Wt 147 lb 12.8 oz (67.042 kg)  SpO2 97%   Wt Readings from Last 3 Encounters:  12/05/13 147 lb 12.8 oz (67.042 kg)  09/03/13 154 lb 6.4 oz (70.035 kg)  04/26/13 156 lb 12.8 oz (71.124 kg)   Constitutional: She  appears well-developed and well-nourished. No distress.  Neck: Normal range of motion. Neck supple. No JVD present. No thyromegaly present.  Cardiovascular: Normal rate, regular rhythm and normal heart sounds.  No murmur heard. No BLE edema. Pulmonary/Chest: Effort normal and breath sounds normal. No respiratory distress. She has no wheezes.  Abd: protuberant, NTND, +BS, no ascites or definitive HSM Neurological: She is alert and oriented to person, place, and time. No cranial nerve deficit. Coordination normal.  Psychiatric: She has a normal mood and affect. Her behavior is normal. Judgment and thought content normal.   Lab Results  Component Value Date   WBC 3.2* 09/03/2013   HGB 9.5* 09/03/2013   HCT 27.5* 09/03/2013   PLT 121.0* 09/03/2013   CHOL 301* 04/26/2013   TRIG 134.0 04/26/2013   HDL 78.10 04/26/2013   LDLDIRECT 196.2 04/26/2013   ALT 29 09/03/2013   AST 54* 09/03/2013   NA 142 09/03/2013   K 3.4* 09/03/2013   CL 108 09/03/2013   CREATININE 1.5* 09/03/2013   BUN 20 09/03/2013   CO2 28 09/03/2013   TSH 1.03 04/22/2011   HGBA1C 6.5 09/03/2013   MICROALBUR 174.7* 01/03/2008   US Abdomen Complete  05/02/2012  *RADIOLOGY REPORT*  Clinical Data:  Abdominal pain.  Prior cholecystectomy.  COMPLETE ABDOMINAL ULTRASOUND  Comparison:  Multiple exams, including 02/03/2010  Findings:  Gallbladder:  Surgically absent.  Common bile duct:  Measures  4 mm in diameter, within normal limits.  Liver:  Hyperechoic and slightly heterogeneous, favoring diffuse fatty infiltration.  A hyperechoic focus in the right hepatic lobe is ill-defined and currently measures 10 mm (formerly the same by my measurements).  IVC:  Unremarkable.  Pancreas:  The tail the pancreas is not well seen due to overlying bowel gas.  The pancreas appears otherwise unremarkable.  Spleen:  Measures 8 cm in length and appears normal.  Right Kidney:  Measures 11.9 cm in length and appears normal.  Left Kidney:  Measures 10.9 cm in length and  appears normal.  Abdominal aorta:  No aneurysm identified.  IMPRESSION: 1.  Essentially stable hyperechoic lesion in the right hepatic lobe anteriorly, not entirely specific but likely a small hemangioma or related to underlying fatty infiltration. 2.  Coarsened echotexture and echogenicity suggest diffuse hepatic steatosis, similar to prior exam. 3.  The tail of the pancreas is not visualized on today's exam due to overlying bowel gas.  Original Report Authenticated By: Carron Curie, M.D.      Assessment & Plan:  See problem list. Medications and labs reviewed today.  Noncompliance remains barrier to better health care  Problem List Items Addressed This Visit   Chronic kidney disease, stage III (moderate)     Aggressive disease, related to uncontrolled hypertension, also diabetes mellitus Discussed need for evaluation by renal, pt will consider - ?same as her husband sees    Relevant Orders      Basic metabolic panel      Ambulatory referral to Nephrology   Hepatitis B carrier     Mild, stable increase LFTs Fatty liver on abdominal ultrasound August 2010; stable 04/2012 -  Suspect cirrhosis as also associated with pancytopenia - recheck CBC now reviewed August 2010 Hep b/c serology > hep C negative but hep B. core positive, surface antibody negative  Reviewed 12/2011 repeat labs> Hep B carrier status Pt missed hepatology eval 05/17/12  - no needed for specialist at this time [facility] following only HepC)    Relevant Orders      CBC with Differential      Hepatic function panel   HYPERTENSION      BP Readings from Last 3 Encounters:  12/05/13 192/78  09/03/13 220/80  04/26/13 182/70  uncontrolled - The current medical regimen is not effective; limited by poor insight and ? poor compliance >> $$ reviewed importance of compliance - no evidence of secondary organ damage or symptoms on exam today but check Bmet Added hydralazine 08/2013 but noncompliant with same Declined refer to  renal as ordered 08/2013    Relevant Orders      Basic metabolic panel      Ambulatory referral to Nephrology   Type II or unspecified type diabetes mellitus with renal manifestations, uncontrolled(250.42) - Primary      uses 70/30 but takes only qd, not bid despite multiple medications and instructions regarding same Noncompliance reviewed Cont to follow with eye specialists, on ACEI+pravastatin, ASA 81 Check a1c now Lab Results  Component Value Date   HGBA1C 6.5 09/03/2013      Relevant Orders      Hemoglobin A1c      Basic metabolic panel      Ambulatory referral to Nephrology

## 2013-12-05 NOTE — Assessment & Plan Note (Signed)
BP Readings from Last 3 Encounters:  12/05/13 192/78  09/03/13 220/80  04/26/13 182/70  uncontrolled - The current medical regimen is not effective; limited by poor insight and ? poor compliance >> $$ reviewed importance of compliance - no evidence of secondary organ damage or symptoms on exam today but check Bmet Added hydralazine 08/2013 but noncompliant with same Declined refer to renal as ordered 08/2013

## 2013-12-05 NOTE — Addendum Note (Signed)
Addended by: Gwendolyn Grant A on: 12/05/2013 04:23 PM   Modules accepted: Level of Service

## 2013-12-05 NOTE — Assessment & Plan Note (Signed)
Mild, stable increase LFTs Fatty liver on abdominal ultrasound August 2010; stable 04/2012 -  Suspect cirrhosis as also associated with pancytopenia - recheck CBC now reviewed August 2010 Hep b/c serology > hep C negative but hep B. core positive, surface antibody negative  Reviewed 12/2011 repeat labs> Hep B carrier status Pt missed hepatology eval 05/17/12  - no needed for specialist at this time [facility] following only HepC)

## 2013-12-05 NOTE — Patient Instructions (Signed)
It was good to see you today.  Please take ALL of your medications and insulin as prescribed!!! You need to call us between appointments if you have problems with your medications  Medications reviewed -Take ALL of them as listed below   Test(s) ordered today. Your results will be released to [facility] (or called to you) after review, usually within 72hours after test completion. If any changes need to be made, you will be notified at that same time.  we'll make referral to kidney specialist to help [place] with your blood pressure control . We will request Dr Posey Pronto. Our office will contact you regarding appointment(s) once made.  Please schedule followup in 4 months for blood pressure and diabetes mellitus check, call sooner if problems.

## 2013-12-05 NOTE — Assessment & Plan Note (Signed)
uses 70/30 but takes only qd, not bid despite multiple medications and instructions regarding same Noncompliance reviewed Cont to follow with eye specialists, on ACEI+pravastatin, ASA 81 Check a1c now Lab Results  Component Value Date   HGBA1C 6.5 09/03/2013

## 2014-01-01 ENCOUNTER — Encounter: Payer: Self-pay | Admitting: Internal Medicine

## 2014-01-01 LAB — HM MAMMOGRAPHY

## 2014-01-08 ENCOUNTER — Encounter (HOSPITAL_COMMUNITY): Payer: Self-pay | Admitting: Emergency Medicine

## 2014-01-08 ENCOUNTER — Emergency Department (HOSPITAL_COMMUNITY): Payer: Medicare HMO

## 2014-01-08 ENCOUNTER — Emergency Department (HOSPITAL_COMMUNITY)
Admission: EM | Admit: 2014-01-08 | Discharge: 2014-01-08 | Disposition: A | Discharge status: Home / Self Care | Payer: Medicare HMO | Attending: Emergency Medicine | Admitting: Emergency Medicine

## 2014-01-08 DIAGNOSIS — Z8739 Personal history of other diseases of the musculoskeletal system and connective tissue: Secondary | ICD-10-CM | POA: Insufficient documentation

## 2014-01-08 DIAGNOSIS — Z8719 Personal history of other diseases of the digestive system: Secondary | ICD-10-CM | POA: Insufficient documentation

## 2014-01-08 DIAGNOSIS — E109 Type 1 diabetes mellitus without complications: Secondary | ICD-10-CM | POA: Insufficient documentation

## 2014-01-08 DIAGNOSIS — Z794 Long term (current) use of insulin: Secondary | ICD-10-CM | POA: Insufficient documentation

## 2014-01-08 DIAGNOSIS — E785 Hyperlipidemia, unspecified: Secondary | ICD-10-CM | POA: Insufficient documentation

## 2014-01-08 DIAGNOSIS — Z8619 Personal history of other infectious and parasitic diseases: Secondary | ICD-10-CM | POA: Insufficient documentation

## 2014-01-08 DIAGNOSIS — G44209 Tension-type headache, unspecified, not intractable: Secondary | ICD-10-CM | POA: Insufficient documentation

## 2014-01-08 DIAGNOSIS — Z9089 Acquired absence of other organs: Secondary | ICD-10-CM | POA: Insufficient documentation

## 2014-01-08 DIAGNOSIS — I10 Essential (primary) hypertension: Secondary | ICD-10-CM | POA: Insufficient documentation

## 2014-01-08 DIAGNOSIS — Z8742 Personal history of other diseases of the female genital tract: Secondary | ICD-10-CM | POA: Insufficient documentation

## 2014-01-08 DIAGNOSIS — Z79899 Other long term (current) drug therapy: Secondary | ICD-10-CM | POA: Insufficient documentation

## 2014-01-08 DIAGNOSIS — Z8659 Personal history of other mental and behavioral disorders: Secondary | ICD-10-CM | POA: Insufficient documentation

## 2014-01-08 DIAGNOSIS — Z8781 Personal history of (healed) traumatic fracture: Secondary | ICD-10-CM | POA: Insufficient documentation

## 2014-01-08 LAB — TROPONIN I: Troponin I: <0.3 ng/mL (ref <0.30)

## 2014-01-08 LAB — CBC WITH DIFFERENTIAL/PLATELET
Basophils Absolute: 0 10*3/uL (ref 0.0–0.1)
Basophils Relative: 1 % (ref 0–1)
EOS ABS: 0 10*3/uL (ref 0.0–0.7)
Eosinophils Relative: 1 % (ref 0–5)
HEMATOCRIT: 27.4 % — AB (ref 36.0–46.0)
Hemoglobin: 9.3 g/dL — ABNORMAL LOW (ref 12.0–15.0)
Lymphocytes Relative: 18 % (ref 12–46)
Lymphs Abs: 0.8 10*3/uL (ref 0.7–4.0)
MCH: 31 pg (ref 26.0–34.0)
MCHC: 33.9 g/dL (ref 30.0–36.0)
MCV: 91.3 fL (ref 78.0–100.0)
MONO ABS: 0.4 10*3/uL (ref 0.1–1.0)
MONOS PCT: 8 % (ref 3–12)
Neutro Abs: 3.1 10*3/uL (ref 1.7–7.7)
Neutrophils Relative %: 73 % (ref 43–77)
Platelets: 130 10*3/uL — ABNORMAL LOW (ref 150–400)
RBC: 3 MIL/uL — ABNORMAL LOW (ref 3.87–5.11)
RDW: 13.9 % (ref 11.5–15.5)
WBC: 4.3 10*3/uL (ref 4.0–10.5)

## 2014-01-08 LAB — BASIC METABOLIC PANEL
BUN: 45 mg/dL — AB (ref 6–23)
CALCIUM: 9.2 mg/dL (ref 8.4–10.5)
CO2: 27 meq/L (ref 19–32)
Chloride: 102 mEq/L (ref 96–112)
Creatinine, Ser: 2.9 mg/dL — ABNORMAL HIGH (ref 0.50–1.10)
GFR calc Af Amer: 18 mL/min — ABNORMAL LOW (ref >90)
GFR, EST NON AFRICAN AMERICAN: 15 mL/min — AB (ref >90)
Glucose, Bld: 101 mg/dL — ABNORMAL HIGH (ref 70–99)
Potassium: 3.8 mEq/L (ref 3.7–5.3)
Sodium: 141 mEq/L (ref 137–147)

## 2014-01-08 MED ORDER — LABETALOL HCL 5 MG/ML IV SOLN
20.0000 mg | INTRAVENOUS | Status: DC | PRN
  Administered 2014-01-08: 20 mg via INTRAVENOUS
  Filled 2014-01-08: qty 4

## 2014-01-08 MED ORDER — METOPROLOL SUCCINATE ER 100 MG PO TB24: 100.0000 mg | ORAL_TABLET | Freq: Every day | ORAL | Status: DC

## 2014-01-08 MED ORDER — METOCLOPRAMIDE HCL 5 MG/ML IJ SOLN
10.0000 mg | Freq: Once | INTRAMUSCULAR | Status: AC
  Administered 2014-01-08: 10 mg via INTRAVENOUS
  Filled 2014-01-08: qty 2

## 2014-01-08 MED ORDER — METHOCARBAMOL 500 MG PO TABS: 500.0000 mg | ORAL_TABLET | Freq: Two times a day (BID) | ORAL | Status: DC

## 2014-01-08 MED ORDER — MORPHINE SULFATE 4 MG/ML IJ SOLN
4.0000 mg | INTRAMUSCULAR | Status: DC | PRN
  Administered 2014-01-08: 4 mg via INTRAVENOUS
  Filled 2014-01-08: qty 1

## 2014-01-08 NOTE — ED Notes (Signed)
Pt arrives from home, c/o neck pain, onset ws last PM while attending the [place], denies injury, resp even unlabored, skin pwd

## 2014-01-08 NOTE — ED Provider Notes (Signed)
CSN: [id number]     Arrival date & time 01/08/14  0701 History   First MD Initiated Contact with Patient 01/08/14 0730     Chief Complaint  Patient presents with  . Neck Pain     HPI  Patient presents with headache and neck pain. She was at the [place] 3 days ago. She waking the next day on Sunday with left-sided posterior headache. Clinically fractured the left posterior neck pain and left lateral neck pain. No fever. No stiff neck. She presents here. Her blood pressure is high. She does states that she was at her doctor's last week it was over 200. No medication adjustments were made. She states it is over 200 every day for the last 6 months. No vision changes today. She states the first day she felt some pressure in her sinuses and her vision seemed "blurry" however it is normal today. No hemianopsia or visual field cuts. No fever. No rash.  Past Medical History  Diagnosis Date  . HYPERTENSION   . OSTEOPENIA   . Unspecified vitamin D deficiency   . Headache(784.0)   . POSTMENOPAUSAL STATUS   . Hepatitis B carrier     05/2009: neg Hep C; Hep B: core pos, Surf neg; fatty liver US 8/10 - 7/13  . HYPERLIPIDEMIA   . GLAUCOMA   . GERD   . DIABETES MELLITUS, TYPE I   . DEPRESSION    Past Surgical History  Procedure Laterality Date  . Cholecystectomy  02/12/07  . Tonsillectomy    . Refractive surgery  07/29/09    Dr. Bing Plume   Family History  Problem Relation Age of Onset  . Coronary artery disease Other   . Diabetes Other   . Hypertension Other   . Arthritis Other    History  Substance Use Topics  . Smoking status: Never Smoker   . Smokeless tobacco: Not on file     Comment: Married x's 66yrs, 6 kids-scattered [employer]. Retired Consulting civil engineer  . Alcohol Use:    OB History   Grav Para Term Preterm Abortions TAB SAB Ect Mult Living                 Review of Systems  Constitutional: Negative for fever, chills, diaphoresis, appetite change and fatigue.  HENT: Negative  for mouth sores, sore throat and trouble swallowing.   Eyes: Negative for visual disturbance.  Respiratory: Negative for cough, chest tightness, shortness of breath and wheezing.   Cardiovascular: Negative for chest pain.  Gastrointestinal: Negative for nausea, vomiting, abdominal pain, diarrhea and abdominal distention.  Endocrine: Negative for polydipsia, polyphagia and polyuria.  Genitourinary: Negative for dysuria, frequency and hematuria.  Musculoskeletal: Positive for myalgias and neck pain. Negative for gait problem.  Skin: Negative for color change, pallor and rash.  Neurological: Positive for headaches. Negative for dizziness, syncope and light-headedness.  Hematological: Does not bruise/bleed easily.  Psychiatric/Behavioral: Negative for behavioral problems and confusion.      Allergies  Review of patient's allergies indicates no known allergies.  Home Medications   Current Outpatient Rx  Name  Route  Sig  Dispense  Refill  . acetaminophen (TYLENOL) 500 MG tablet   Oral   Take 500 mg by mouth every 6 (six) hours as needed for mild pain or headache.         Marland Kitchen amLODipine (NORVASC) 10 MG tablet   Oral   Take 1 tablet (10 mg total) by mouth daily.   90 tablet  3   . Cholecalciferol (VITAMIN D) 2000 UNITS tablet   Oral   Take 2,000 Units by mouth daily.         . hydrochlorothiazide (HYDRODIURIL) 25 MG tablet   Oral   Take 1 tablet (25 mg total) by mouth daily.   90 tablet   3   . insulin NPH Human (HUMULIN N,NOVOLIN N) 100 UNIT/ML injection   Subcutaneous   Inject 25 Units into the skin See admin instructions. On a sliding scale(<150). Can use up to three times a day before a meal.         . lisinopril (PRINIVIL,ZESTRIL) 40 MG tablet   Oral   Take 1 tablet (40 mg total) by mouth daily.   90 tablet   3   . metoprolol succinate (TOPROL-XL) 50 MG 24 hr tablet   Oral   Take 1 tablet (50 mg total) by mouth daily. Take with or immediately following a  meal.   90 tablet   3   . pravastatin (PRAVACHOL) 80 MG tablet   Oral   Take 1 tablet (80 mg total) by mouth at bedtime.   90 tablet   3   . spironolactone (ALDACTONE) 25 MG tablet   Oral   Take 1 tablet (25 mg total) by mouth daily.   90 tablet   3   . timolol (BETIMOL) 0.5 % ophthalmic solution   Right Eye   Place 1 drop into the right eye every morning.         . methocarbamol (ROBAXIN) 500 MG tablet   Oral   Take 1 tablet (500 mg total) by mouth 2 (two) times daily.   20 tablet   0   . metoprolol succinate (TOPROL-XL) 100 MG 24 hr tablet   Oral   Take 1 tablet (100 mg total) by mouth daily.   30 tablet   0    BP 162/61  Pulse 66  Temp(Src) 98 F (36.7 C) (Oral)  Resp 13  Wt 147 lb (66.679 kg)  SpO2 99% Physical Exam  Constitutional: She is oriented to person, place, and time. She appears well-developed and well-nourished. No distress.  HENT:  Head: Normocephalic.  Eyes: Conjunctivae are normal. Pupils are equal, round, and reactive to light. No scleral icterus.  Neck: Normal range of motion. Neck supple. No thyromegaly present.    Cardiovascular: Normal rate and regular rhythm.  Exam reveals no gallop and no friction rub.   No murmur heard. Pulmonary/Chest: Effort normal and breath sounds normal. No respiratory distress. She has no wheezes. She has no rales.  Abdominal: Soft. Bowel sounds are normal. She exhibits no distension. There is no tenderness. There is no rebound.  Musculoskeletal: Normal range of motion.  Neurological: She is alert and oriented to person, place, and time.  Normal HEENT and neurological exam. No cranial nerve abnormalities no visual field cuts. Normal balance and gait normal strength and sensation peripherally.  Skin: Skin is warm and dry. No rash noted.  Psychiatric: She has a normal mood and affect. Her behavior is normal.    ED Course  Procedures (including critical care time) Labs Review Labs Reviewed  CBC WITH  DIFFERENTIAL - Abnormal; Notable for the following:    RBC 3.00 (*)    Hemoglobin 9.3 (*)    HCT 27.4 (*)    Platelets 130 (*)    All other components within normal limits  BASIC METABOLIC PANEL - Abnormal; Notable for the following:    Glucose,  Bld 101 (*)    BUN 45 (*)    Creatinine, Ser 2.90 (*)    GFR calc non Af Amer 15 (*)    GFR calc Af Amer 18 (*)    All other components within normal limits  TROPONIN I   Imaging Review Ct Head Wo Contrast  01/08/2014   CLINICAL DATA Neck pain  EXAM CT HEAD WITHOUT CONTRAST  CT MAXILLOFACIAL WITHOUT CONTRAST  TECHNIQUE Multidetector CT imaging of the head and maxillofacial structures were performed using the standard protocol without intravenous contrast. Multiplanar CT image reconstructions of the maxillofacial structures were also generated.  COMPARISON CT head 04/01/2008  FINDINGS CT HEAD FINDINGS  Mild atrophy with mild chronic microvascular ischemia. No acute infarct. Negative for hemorrhage or mass. No edema or shift of the midline structures. Calvarium is normal.  CT MAXILLOFACIAL FINDINGS  Negative for facial fracture. The orbit is normal. No bony lesion is identified.  Soft tissues are normal. The orbit is normal. No mass lesion identified.  Degenerative changes C1-C2.  Cervical spondylosis.  Minimal mucosal edema in the paranasal sinuses without air-fluid level.  IMPRESSION Atrophy and mild chronic microvascular ischemia. No acute intracranial abnormality.  No acute abnormality in the face.  SIGNATURE  Electronically Signed   By: Franchot Gallo M.D.   On: 01/08/2014 09:20   Ct Maxillofacial Wo Cm  01/08/2014   CLINICAL DATA Neck pain  EXAM CT HEAD WITHOUT CONTRAST  CT MAXILLOFACIAL WITHOUT CONTRAST  TECHNIQUE Multidetector CT imaging of the head and maxillofacial structures were performed using the standard protocol without intravenous contrast. Multiplanar CT image reconstructions of the maxillofacial structures were also generated.  COMPARISON  CT head 04/01/2008  FINDINGS CT HEAD FINDINGS  Mild atrophy with mild chronic microvascular ischemia. No acute infarct. Negative for hemorrhage or mass. No edema or shift of the midline structures. Calvarium is normal.  CT MAXILLOFACIAL FINDINGS  Negative for facial fracture. The orbit is normal. No bony lesion is identified.  Soft tissues are normal. The orbit is normal. No mass lesion identified.  Degenerative changes C1-C2.  Cervical spondylosis.  Minimal mucosal edema in the paranasal sinuses without air-fluid level.  IMPRESSION Atrophy and mild chronic microvascular ischemia. No acute intracranial abnormality.  No acute abnormality in the face.  SIGNATURE  Electronically Signed   By: Franchot Gallo M.D.   On: 01/08/2014 09:20     EKG Interpretation None      MDM   Final diagnoses:  Hypertension  Muscle tension headache    Normal imaging. Normal as per her blood pressure bruise one dose of IV labetalol. I think her symptoms and findings are consistent with muscle tension headache. Plan will be symptom control Robaxin. Hypertension: Will add a higher dose of metoprolol to her daily regimen.    Tanna Furry, MD 01/08/14 1016

## 2014-01-08 NOTE — Discharge Instructions (Signed)
You have been given a new Prescription for Metoprolol/Toprol.  Take this INSTEAD OF your current Metoprolol/Toprol Prescription. Robaxin is a muscle relaxant to help with your muscle tension headache. Continue your other medications at their current dosage.  Hypertension Hypertension is another name for high blood pressure. High blood pressure may mean that your heart needs to work harder to pump blood. Blood pressure consists of two numbers, which includes a higher number over a lower number (example: 110/72). HOME CARE   Make lifestyle changes as told by your doctor. This may include weight loss and exercise.  Take your blood pressure medicine every day.  Limit how much salt you use.  Stop smoking if you smoke.  Do not use drugs.  Talk to your doctor if you are using decongestants or birth control pills. These medicines might make blood pressure higher.  Females should not drink more than 1 alcoholic drink per day. Males should not drink more than 2 alcoholic drinks per day.  See your doctor as told. GET HELP RIGHT AWAY IF:   You have a blood pressure reading with a top number of 180 or higher.  You get a very bad headache.  You get blurred or changing vision.  You feel confused.  You feel weak, numb, or faint.  You get chest or belly (abdominal) pain.  You throw up (vomit).  You cannot breathe very well. MAKE SURE YOU:   Understand these instructions.  Will watch your condition.  Will get help right away if you are not doing well or get worse. Document Released: 04/04/2008 Document Revised: 01/09/2012 Document Reviewed: 04/04/2008 [facility] Patient Information 2014 [place], [place].  Tension Headache A tension headache is a feeling of pain, pressure, or aching often felt over the front and sides of the head. The pain can be dull or can feel tight (constricting). It is the most common type of headache. Tension headaches are not normally associated with nausea or  vomiting and do not get worse with physical activity. Tension headaches can last 30 minutes to several days.  CAUSES  The exact cause is not known, but it may be caused by chemicals and hormones in the brain that lead to pain. Tension headaches often begin after stress, anxiety, or depression. Other triggers may include:  Alcohol.  Caffeine (too much or withdrawal).  Respiratory infections (colds, flu, sinus infections).  Dental problems or teeth clenching.  Fatigue.  Holding your head and neck in one position too long while using a computer. SYMPTOMS   Pressure around the head.   Dull, aching head pain.   Pain felt over the front and sides of the head.   Tenderness in the muscles of the head, neck, and shoulders. DIAGNOSIS  A tension headache is often diagnosed based on:   Symptoms.   Physical examination.   A CT scan or MRI of your head. These tests may be ordered if symptoms are severe or unusual. TREATMENT  Medicines may be given to help relieve symptoms.  HOME CARE INSTRUCTIONS   Only take over-the-counter or prescription medicines for pain or discomfort as directed by your caregiver.   Lie down in a dark, quiet room when you have a headache.   Keep a journal to find out what may be triggering your headaches. For example, write down:  What you eat and drink.  How much sleep you get.  Any change to your diet or medicines.  Try massage or other relaxation techniques.   Ice packs or heat  applied to the head and neck can be used. Use these 3 to 4 times per day for 15 to 20 minutes each time, or as needed.   Limit stress.   Sit up straight, and do not tense your muscles.   Quit smoking if you smoke.  Limit alcohol use.  Decrease the amount of caffeine you drink, or stop drinking caffeine.  Eat and exercise regularly.  Get 7 to 9 hours of sleep, or as recommended by your caregiver.  Avoid excessive use of pain medicine as recurrent headaches  can occur.  SEEK MEDICAL CARE IF:   You have problems with the medicines you were prescribed.  Your medicines do not work.  You have a change from the usual headache.  You have nausea or vomiting. SEEK IMMEDIATE MEDICAL CARE IF:   Your headache becomes severe.  You have a fever.  You have a stiff neck.  You have loss of vision.  You have muscular weakness or loss of muscle control.  You lose your balance or have trouble walking.  You feel faint or pass out.  You have severe symptoms that are different from your first symptoms. MAKE SURE YOU:   Understand these instructions.  Will watch your condition.  Will get help right away if you are not doing well or get worse. Document Released: 10/17/2005 Document Revised: 01/09/2012 Document Reviewed: 10/07/2011 [facility] Patient Information 2014 [place], [place].

## 2014-01-08 NOTE — ED Notes (Signed)
Pt reports left side HA (severe pressure) with blurred vision sudden onset 3 days ago. Pt reports no weakness then or now. Pt at present report BIL neck pain. Able to move neck without difficulty. EDP Jeneen Rinks made aware of findings. Neuro intact. GCDS 15 PEERL

## 2014-01-16 ENCOUNTER — Other Ambulatory Visit (HOSPITAL_COMMUNITY): Payer: Self-pay | Admitting: Nephrology

## 2014-01-16 DIAGNOSIS — N179 Acute kidney failure, unspecified: Secondary | ICD-10-CM

## 2014-01-16 DIAGNOSIS — I10 Essential (primary) hypertension: Secondary | ICD-10-CM

## 2014-01-24 ENCOUNTER — Other Ambulatory Visit: Payer: Self-pay | Admitting: Radiology

## 2014-01-27 ENCOUNTER — Encounter (HOSPITAL_COMMUNITY): Payer: Self-pay

## 2014-01-28 ENCOUNTER — Ambulatory Visit (HOSPITAL_COMMUNITY): Admission: RE | Admit: 2014-01-28 | Payer: Medicare HMO | Source: Ambulatory Visit

## 2014-01-31 ENCOUNTER — Other Ambulatory Visit: Payer: Self-pay | Admitting: Radiology

## 2014-02-04 ENCOUNTER — Ambulatory Visit (HOSPITAL_COMMUNITY): Admission: RE | Admit: 2014-02-04 | Payer: Medicare HMO | Source: Ambulatory Visit

## 2014-02-05 ENCOUNTER — Encounter (HOSPITAL_COMMUNITY): Payer: Self-pay | Admitting: Pharmacy Technician

## 2014-02-07 ENCOUNTER — Other Ambulatory Visit: Payer: Self-pay | Admitting: Radiology

## 2014-02-10 ENCOUNTER — Ambulatory Visit (HOSPITAL_COMMUNITY)
Admission: RE | Admit: 2014-02-10 | Discharge: 2014-02-10 | Disposition: A | Discharge status: Home / Self Care | Payer: Medicare HMO | Source: Ambulatory Visit | Attending: Nephrology | Admitting: Nephrology

## 2014-02-10 DIAGNOSIS — I10 Essential (primary) hypertension: Secondary | ICD-10-CM | POA: Insufficient documentation

## 2014-02-10 DIAGNOSIS — N179 Acute kidney failure, unspecified: Secondary | ICD-10-CM | POA: Insufficient documentation

## 2014-02-10 LAB — BASIC METABOLIC PANEL
BUN: 37 mg/dL — ABNORMAL HIGH (ref 6–23)
CALCIUM: 8.9 mg/dL (ref 8.4–10.5)
CO2: 26 mEq/L (ref 19–32)
CREATININE: 3.23 mg/dL — AB (ref 0.50–1.10)
Chloride: 105 mEq/L (ref 96–112)
GFR calc Af Amer: 15 mL/min — ABNORMAL LOW (ref >90)
GFR calc non Af Amer: 13 mL/min — ABNORMAL LOW (ref >90)
GLUCOSE: 100 mg/dL — AB (ref 70–99)
Potassium: 3.4 mEq/L — ABNORMAL LOW (ref 3.7–5.3)
SODIUM: 144 meq/L (ref 137–147)

## 2014-02-10 LAB — CBC
HCT: 26.9 % — ABNORMAL LOW (ref 36.0–46.0)
Hemoglobin: 9 g/dL — ABNORMAL LOW (ref 12.0–15.0)
MCH: 30.5 pg (ref 26.0–34.0)
MCHC: 33.5 g/dL (ref 30.0–36.0)
MCV: 91.2 fL (ref 78.0–100.0)
PLATELETS: 155 10*3/uL (ref 150–400)
RBC: 2.95 MIL/uL — AB (ref 3.87–5.11)
RDW: 14.1 % (ref 11.5–15.5)
WBC: 3.7 10*3/uL — ABNORMAL LOW (ref 4.0–10.5)

## 2014-02-10 LAB — PROTIME-INR
INR: 0.92 (ref 0.00–1.49)
Prothrombin Time: 12.2 seconds (ref 11.6–15.2)

## 2014-02-10 LAB — APTT: aPTT: 32 seconds (ref 24–37)

## 2014-02-10 MED ORDER — SODIUM CHLORIDE 0.9 % IV SOLN
INTRAVENOUS | Status: DC
  Administered 2014-02-10: 09:00:00 via INTRAVENOUS

## 2014-02-10 NOTE — H&P (Signed)
[name] [name] is an 72 y.o. female.   Chief Complaint: "I'm having a kidney biopsy" HPI: Patient with history of acute on chronic renal failure, DM,HTN with poor compliance , chronic hepatitis B, and proteinuria presents today for US guided random renal biopsy.  Past Medical History  Diagnosis Date  . HYPERTENSION   . OSTEOPENIA   . Unspecified vitamin D deficiency   . Headache(784.0)   . POSTMENOPAUSAL STATUS   . Hepatitis B carrier     05/2009: neg Hep C; Hep B: core pos, Surf neg; fatty liver US 8/10 - 7/13  . HYPERLIPIDEMIA   . GLAUCOMA   . GERD   . DIABETES MELLITUS, TYPE I   . DEPRESSION     Past Surgical History  Procedure Laterality Date  . Cholecystectomy  02/12/07  . Tonsillectomy    . Refractive surgery  07/29/09    Dr. Bing Plume    Family History  Problem Relation Age of Onset  . Coronary artery disease Other   . Diabetes Other   . Hypertension Other   . Arthritis Other    Social History:  reports that she has never smoked. She does not have any smokeless tobacco history on file. Her alcohol and drug histories are not on file.  Allergies:  Allergies  Allergen Reactions  . Hydralazine Itching  . Robaxin [Methocarbamol]     Dizziness     Current outpatient prescriptions:acetaminophen (TYLENOL) 500 MG tablet, Take 500 mg by mouth every 6 (six) hours as needed for mild pain or headache., Disp: , Rfl: ;  amLODipine (NORVASC) 10 MG tablet, Take 1 tablet (10 mg total) by mouth daily., Disp: 90 tablet, Rfl: 3;  Cholecalciferol (VITAMIN D3) 2000 UNITS CHEW, Chew 1 tablet by mouth daily., Disp: , Rfl: ;  furosemide (LASIX) 40 MG tablet, Take 40 mg by mouth daily., Disp: , Rfl:  hydrochlorothiazide (HYDRODIURIL) 25 MG tablet, Take 1 tablet (25 mg total) by mouth daily., Disp: 90 tablet, Rfl: 3;  insulin NPH Human (HUMULIN N,NOVOLIN N) 100 UNIT/ML injection, Inject 25 Units into the skin See admin instructions. On a sliding scale(<150). Can use up to three times a day before  a meal., Disp: , Rfl: ;  lisinopril (PRINIVIL,ZESTRIL) 40 MG tablet, Take 1 tablet (40 mg total) by mouth daily., Disp: 90 tablet, Rfl: 3 methocarbamol (ROBAXIN) 500 MG tablet, Take 1 tablet (500 mg total) by mouth 2 (two) times daily., Disp: 20 tablet, Rfl: 0;  metoprolol succinate (TOPROL-XL) 100 MG 24 hr tablet, Take 1 tablet (100 mg total) by mouth daily., Disp: 30 tablet, Rfl: 0;  timolol (BETIMOL) 0.5 % ophthalmic solution, Place 1 drop into the right eye every morning., Disp: , Rfl:  Current facility-administered medications:0.9 %  sodium chloride infusion, , Intravenous, Continuous, D Rowe Robert, PA-C, Last Rate: 10 mL/hr at 02/10/14 [id number]  Results for orders placed during the hospital encounter of 02/10/14  APTT      Result Value Ref Range   aPTT 32  24 - 37 seconds    Review of Systems  Constitutional: Negative for fever.  HENT:       Occ HA's  Respiratory: Negative for hemoptysis.        Occ cough and dyspnea  Cardiovascular: Negative for chest pain.  Gastrointestinal: Negative for nausea and vomiting.       Occ epigastric discomfort  Genitourinary: Negative for hematuria.  Musculoskeletal: Positive for back pain.    Blood pressure 210/56, pulse 57, temperature 98.7 F (37.1  C), SpO2 100.00%. Physical Exam  Constitutional: She is oriented to person, place, and time. She appears well-developed and well-nourished.  Cardiovascular: Normal rate and regular rhythm.   Respiratory: Effort normal and breath sounds normal.  GI: Soft. Bowel sounds are normal.  Mildly tender epigastric region  Musculoskeletal: Normal range of motion. She exhibits edema.  Neurological: She is alert and oriented to person, place, and time.     Assessment/Plan Patient with history of acute on chronic renal failure, DM,HTN with poor compliance , chronic hepatitis B, and proteinuria presents today for US guided random renal biopsy. Details/risks of procedure d/w pt/husband with their understanding and  consent.  [name] [name] 02/10/2014, 9:24 AM

## 2014-02-10 NOTE — Progress Notes (Signed)
After further review, pt did not take her blood pressure medicines today but rather took twice yesterday.  Her systolic BP is ranging between 200-225 mmHg today and I feel that renal biopsy at this time has an inordinately high risk of bleeding complication.  I discussed with the patient and her family the options of having her take her home meds now and trying to do her biopsy later today vs rescheduling and making sure she takes her meds on her next procedure day.  She prefers to reschedule.   Even with her home meds on board, I expect we will have to give her additional IV medications on the day of biopsy to achieve a reasonable systolic BP.  She has hydralazine listed as an allergy - it causes itching which she said was relieved in the past with benadryl.   She may require both beta blockage and IV hydralazine + benadryl on her scheduled biopsy date.   Signed,  Criselda Peaches, MD [facility]

## 2014-02-10 NOTE — H&P (Signed)
Agree with PA note.  Plan for random renal biopsy.  Pt at higher risk for bleeding complication secondary to systolic hypertension.  Will attempt to decrease BP to < 0000000 mmHg systolic.   Signed,  Criselda Peaches, MD [facility]

## 2014-03-19 ENCOUNTER — Other Ambulatory Visit: Payer: Self-pay | Admitting: Internal Medicine

## 2014-04-04 ENCOUNTER — Ambulatory Visit: Payer: Medicare HMO | Admitting: Internal Medicine

## 2014-05-06 ENCOUNTER — Telehealth: Payer: Self-pay

## 2014-05-06 NOTE — Telephone Encounter (Signed)
Pt states she is no longer under the care of Dr. Asa Lente so she declined follow up appointment

## 2014-05-17 ENCOUNTER — Encounter (HOSPITAL_COMMUNITY): Payer: Self-pay | Admitting: Emergency Medicine

## 2014-05-17 ENCOUNTER — Other Ambulatory Visit: Payer: Self-pay

## 2014-05-17 ENCOUNTER — Emergency Department (HOSPITAL_COMMUNITY): Payer: Medicare HMO

## 2014-05-17 ENCOUNTER — Inpatient Hospital Stay (HOSPITAL_COMMUNITY)
Admission: EM | Admit: 2014-05-17 | Discharge: 2014-05-31 | DRG: 673 | Disposition: A | Discharge status: Home Health | Payer: Medicare HMO | Attending: Internal Medicine | Admitting: Internal Medicine

## 2014-05-17 DIAGNOSIS — N179 Acute kidney failure, unspecified: Secondary | ICD-10-CM | POA: Diagnosis present

## 2014-05-17 DIAGNOSIS — E1165 Type 2 diabetes mellitus with hyperglycemia: Secondary | ICD-10-CM

## 2014-05-17 DIAGNOSIS — E46 Unspecified protein-calorie malnutrition: Secondary | ICD-10-CM | POA: Diagnosis present

## 2014-05-17 DIAGNOSIS — Z888 Allergy status to other drugs, medicaments and biological substances status: Secondary | ICD-10-CM | POA: Diagnosis not present

## 2014-05-17 DIAGNOSIS — R7989 Other specified abnormal findings of blood chemistry: Secondary | ICD-10-CM | POA: Diagnosis present

## 2014-05-17 DIAGNOSIS — R188 Other ascites: Secondary | ICD-10-CM | POA: Diagnosis not present

## 2014-05-17 DIAGNOSIS — IMO0002 Reserved for concepts with insufficient information to code with codable children: Secondary | ICD-10-CM | POA: Diagnosis not present

## 2014-05-17 DIAGNOSIS — R059 Cough, unspecified: Secondary | ICD-10-CM | POA: Diagnosis present

## 2014-05-17 DIAGNOSIS — E109 Type 1 diabetes mellitus without complications: Secondary | ICD-10-CM

## 2014-05-17 DIAGNOSIS — N2581 Secondary hyperparathyroidism of renal origin: Secondary | ICD-10-CM | POA: Diagnosis present

## 2014-05-17 DIAGNOSIS — K219 Gastro-esophageal reflux disease without esophagitis: Secondary | ICD-10-CM | POA: Diagnosis present

## 2014-05-17 DIAGNOSIS — J189 Pneumonia, unspecified organism: Secondary | ICD-10-CM | POA: Diagnosis not present

## 2014-05-17 DIAGNOSIS — Z881 Allergy status to other antibiotic agents status: Secondary | ICD-10-CM | POA: Diagnosis not present

## 2014-05-17 DIAGNOSIS — I249 Acute ischemic heart disease, unspecified: Secondary | ICD-10-CM

## 2014-05-17 DIAGNOSIS — B181 Chronic viral hepatitis B without delta-agent: Secondary | ICD-10-CM | POA: Diagnosis present

## 2014-05-17 DIAGNOSIS — E1065 Type 1 diabetes mellitus with hyperglycemia: Secondary | ICD-10-CM

## 2014-05-17 DIAGNOSIS — N186 End stage renal disease: Secondary | ICD-10-CM | POA: Diagnosis present

## 2014-05-17 DIAGNOSIS — I200 Unstable angina: Secondary | ICD-10-CM

## 2014-05-17 DIAGNOSIS — E1121 Type 2 diabetes mellitus with diabetic nephropathy: Secondary | ICD-10-CM | POA: Diagnosis present

## 2014-05-17 DIAGNOSIS — N289 Disorder of kidney and ureter, unspecified: Secondary | ICD-10-CM

## 2014-05-17 DIAGNOSIS — I214 Non-ST elevation (NSTEMI) myocardial infarction: Secondary | ICD-10-CM | POA: Diagnosis present

## 2014-05-17 DIAGNOSIS — N058 Unspecified nephritic syndrome with other morphologic changes: Secondary | ICD-10-CM | POA: Diagnosis present

## 2014-05-17 DIAGNOSIS — K5900 Constipation, unspecified: Secondary | ICD-10-CM | POA: Diagnosis not present

## 2014-05-17 DIAGNOSIS — M899 Disorder of bone, unspecified: Secondary | ICD-10-CM | POA: Diagnosis present

## 2014-05-17 DIAGNOSIS — R072 Precordial pain: Secondary | ICD-10-CM

## 2014-05-17 DIAGNOSIS — D649 Anemia, unspecified: Secondary | ICD-10-CM | POA: Diagnosis present

## 2014-05-17 DIAGNOSIS — I10 Essential (primary) hypertension: Secondary | ICD-10-CM | POA: Diagnosis present

## 2014-05-17 DIAGNOSIS — K7689 Other specified diseases of liver: Secondary | ICD-10-CM | POA: Diagnosis present

## 2014-05-17 DIAGNOSIS — E1029 Type 1 diabetes mellitus with other diabetic kidney complication: Secondary | ICD-10-CM | POA: Diagnosis present

## 2014-05-17 DIAGNOSIS — H409 Unspecified glaucoma: Secondary | ICD-10-CM | POA: Diagnosis present

## 2014-05-17 DIAGNOSIS — E785 Hyperlipidemia, unspecified: Secondary | ICD-10-CM | POA: Diagnosis present

## 2014-05-17 DIAGNOSIS — I120 Hypertensive chronic kidney disease with stage 5 chronic kidney disease or end stage renal disease: Secondary | ICD-10-CM | POA: Diagnosis present

## 2014-05-17 DIAGNOSIS — I252 Old myocardial infarction: Secondary | ICD-10-CM

## 2014-05-17 DIAGNOSIS — R778 Other specified abnormalities of plasma proteins: Secondary | ICD-10-CM | POA: Diagnosis present

## 2014-05-17 DIAGNOSIS — E559 Vitamin D deficiency, unspecified: Secondary | ICD-10-CM | POA: Diagnosis present

## 2014-05-17 DIAGNOSIS — N184 Chronic kidney disease, stage 4 (severe): Secondary | ICD-10-CM | POA: Diagnosis present

## 2014-05-17 DIAGNOSIS — R079 Chest pain, unspecified: Secondary | ICD-10-CM | POA: Diagnosis not present

## 2014-05-17 DIAGNOSIS — M949 Disorder of cartilage, unspecified: Secondary | ICD-10-CM

## 2014-05-17 DIAGNOSIS — E1129 Type 2 diabetes mellitus with other diabetic kidney complication: Secondary | ICD-10-CM

## 2014-05-17 DIAGNOSIS — I161 Hypertensive emergency: Secondary | ICD-10-CM | POA: Diagnosis present

## 2014-05-17 DIAGNOSIS — R05 Cough: Secondary | ICD-10-CM | POA: Diagnosis not present

## 2014-05-17 LAB — BASIC METABOLIC PANEL
Anion gap: 13 (ref 5–15)
BUN: 47 mg/dL — AB (ref 6–23)
CALCIUM: 9.4 mg/dL (ref 8.4–10.5)
CHLORIDE: 107 meq/L (ref 96–112)
CO2: 24 meq/L (ref 19–32)
CREATININE: 4.2 mg/dL — AB (ref 0.50–1.10)
GFR calc Af Amer: 11 mL/min — ABNORMAL LOW (ref >90)
GFR calc non Af Amer: 10 mL/min — ABNORMAL LOW (ref >90)
GLUCOSE: 137 mg/dL — AB (ref 70–99)
Potassium: 4.2 mEq/L (ref 3.7–5.3)
Sodium: 144 mEq/L (ref 137–147)

## 2014-05-17 LAB — PROTIME-INR
INR: 1.05 (ref 0.00–1.49)
PROTHROMBIN TIME: 13.7 s (ref 11.6–15.2)

## 2014-05-17 LAB — I-STAT CG4 LACTIC ACID, ED: Lactic Acid, Venous: 1.24 mmol/L (ref 0.5–2.2)

## 2014-05-17 LAB — I-STAT TROPONIN, ED: Troponin i, poc: 2.63 ng/mL (ref 0.00–0.08)

## 2014-05-17 LAB — TROPONIN I
TROPONIN I: 3.3 ng/mL — AB (ref <0.30)
Troponin I: 3.16 ng/mL (ref <0.30)

## 2014-05-17 LAB — CBC
HEMATOCRIT: 25.2 % — AB (ref 36.0–46.0)
Hemoglobin: 8.3 g/dL — ABNORMAL LOW (ref 12.0–15.0)
MCH: 30.4 pg (ref 26.0–34.0)
MCHC: 32.9 g/dL (ref 30.0–36.0)
MCV: 92.3 fL (ref 78.0–100.0)
Platelets: 146 10*3/uL — ABNORMAL LOW (ref 150–400)
RBC: 2.73 MIL/uL — AB (ref 3.87–5.11)
RDW: 14.3 % (ref 11.5–15.5)
WBC: 4 10*3/uL (ref 4.0–10.5)

## 2014-05-17 LAB — PRO B NATRIURETIC PEPTIDE: Pro B Natriuretic peptide (BNP): 20847 pg/mL — ABNORMAL HIGH (ref 0–125)

## 2014-05-17 LAB — GLUCOSE, CAPILLARY: Glucose-Capillary: 219 mg/dL — ABNORMAL HIGH (ref 70–99)

## 2014-05-17 LAB — POC OCCULT BLOOD, ED: Fecal Occult Bld: NEGATIVE

## 2014-05-17 LAB — MRSA PCR SCREENING: MRSA BY PCR: NEGATIVE

## 2014-05-17 LAB — CLOSTRIDIUM DIFFICILE BY PCR: CDIFFPCR: NEGATIVE

## 2014-05-17 MED ORDER — ATORVASTATIN CALCIUM 40 MG PO TABS
40.0000 mg | ORAL_TABLET | Freq: Every day | ORAL | Status: DC
  Administered 2014-05-18 – 2014-05-29 (×9): 40 mg via ORAL
  Filled 2014-05-17 (×15): qty 1

## 2014-05-17 MED ORDER — MORPHINE SULFATE 2 MG/ML IJ SOLN
2.0000 mg | INTRAMUSCULAR | Status: DC | PRN
  Administered 2014-05-17: 2 mg via INTRAVENOUS
  Filled 2014-05-17: qty 1

## 2014-05-17 MED ORDER — ACETAMINOPHEN 325 MG PO TABS: 650.0000 mg | ORAL_TABLET | Freq: Four times a day (QID) | ORAL | Status: DC | PRN

## 2014-05-17 MED ORDER — TIMOLOL MALEATE 0.5 % OP SOLN
1.0000 [drp] | Freq: Every morning | OPHTHALMIC | Status: DC
  Administered 2014-05-18 – 2014-05-31 (×11): 1 [drp] via OPHTHALMIC
  Filled 2014-05-17 (×2): qty 5

## 2014-05-17 MED ORDER — AMLODIPINE BESYLATE 10 MG PO TABS
10.0000 mg | ORAL_TABLET | Freq: Every day | ORAL | Status: DC
  Administered 2014-05-17 – 2014-05-18 (×2): 10 mg via ORAL
  Filled 2014-05-17 (×2): qty 1

## 2014-05-17 MED ORDER — ALUM & MAG HYDROXIDE-SIMETH 200-200-20 MG/5ML PO SUSP: 30.0000 mL | Freq: Four times a day (QID) | ORAL | Status: DC | PRN

## 2014-05-17 MED ORDER — HEPARIN SODIUM (PORCINE) 5000 UNIT/ML IJ SOLN
5000.0000 [IU] | Freq: Three times a day (TID) | INTRAMUSCULAR | Status: DC
  Administered 2014-05-17 – 2014-05-31 (×31): 5000 [IU] via SUBCUTANEOUS
  Filled 2014-05-17 (×46): qty 1

## 2014-05-17 MED ORDER — METOPROLOL SUCCINATE ER 25 MG PO TB24
100.0000 mg | ORAL_TABLET | Freq: Every day | ORAL | Status: DC
  Administered 2014-05-17 – 2014-05-18 (×2): 100 mg via ORAL
  Filled 2014-05-17 (×2): qty 4

## 2014-05-17 MED ORDER — SODIUM CHLORIDE 0.9 % IJ SOLN
3.0000 mL | Freq: Two times a day (BID) | INTRAMUSCULAR | Status: DC
  Administered 2014-05-18 – 2014-05-30 (×19): 3 mL via INTRAVENOUS

## 2014-05-17 MED ORDER — INSULIN ASPART 100 UNIT/ML ~~LOC~~ SOLN
0.0000 [IU] | Freq: Every day | SUBCUTANEOUS | Status: DC
  Administered 2014-05-17: 2 [IU] via SUBCUTANEOUS

## 2014-05-17 MED ORDER — INSULIN ASPART 100 UNIT/ML ~~LOC~~ SOLN
0.0000 [IU] | Freq: Three times a day (TID) | SUBCUTANEOUS | Status: DC
  Administered 2014-05-18: 2 [IU] via SUBCUTANEOUS
  Administered 2014-05-20 – 2014-05-21 (×2): 3 [IU] via SUBCUTANEOUS
  Administered 2014-05-21: 2 [IU] via SUBCUTANEOUS
  Administered 2014-05-22: 3 [IU] via SUBCUTANEOUS
  Administered 2014-05-22: 2 [IU] via SUBCUTANEOUS
  Administered 2014-05-23: 3 [IU] via SUBCUTANEOUS
  Administered 2014-05-23 – 2014-05-25 (×4): 2 [IU] via SUBCUTANEOUS
  Administered 2014-05-26: 3 [IU] via SUBCUTANEOUS
  Administered 2014-05-26: 2 [IU] via SUBCUTANEOUS

## 2014-05-17 MED ORDER — ONDANSETRON HCL 4 MG PO TABS: 4.0000 mg | ORAL_TABLET | Freq: Four times a day (QID) | ORAL | Status: DC | PRN

## 2014-05-17 MED ORDER — ASPIRIN 81 MG PO CHEW
324.0000 mg | CHEWABLE_TABLET | Freq: Once | ORAL | Status: AC
  Administered 2014-05-17: 324 mg via ORAL
  Filled 2014-05-17: qty 4

## 2014-05-17 MED ORDER — DEXTROSE 5 % IV SOLN
1.0000 g | INTRAVENOUS | Status: DC
  Administered 2014-05-18 – 2014-05-23 (×6): 1 g via INTRAVENOUS
  Filled 2014-05-17 (×7): qty 10

## 2014-05-17 MED ORDER — OXYCODONE HCL 5 MG PO TABS: 5.0000 mg | ORAL_TABLET | ORAL | Status: DC | PRN

## 2014-05-17 MED ORDER — NITROGLYCERIN IN D5W 200-5 MCG/ML-% IV SOLN
5.0000 ug/min | INTRAVENOUS | Status: DC
  Administered 2014-05-17: 5 ug/min via INTRAVENOUS
  Filled 2014-05-17 (×3): qty 250

## 2014-05-17 MED ORDER — NITROGLYCERIN IN D5W 200-5 MCG/ML-% IV SOLN
2.0000 ug/min | INTRAVENOUS | Status: DC
  Administered 2014-05-18 (×2): 200 ug/min via INTRAVENOUS
  Filled 2014-05-17 (×2): qty 250

## 2014-05-17 MED ORDER — BIOTENE DRY MOUTH MT LIQD
15.0000 mL | Freq: Two times a day (BID) | OROMUCOSAL | Status: DC
  Administered 2014-05-18 – 2014-05-30 (×19): 15 mL via OROMUCOSAL

## 2014-05-17 MED ORDER — SODIUM CHLORIDE 0.9 % IV BOLUS (SEPSIS)
500.0000 mL | Freq: Once | INTRAVENOUS | Status: AC
  Administered 2014-05-17: 500 mL via INTRAVENOUS

## 2014-05-17 MED ORDER — DEXTROSE 5 % IV SOLN: 500.0000 mg | Freq: Once | INTRAVENOUS | Status: DC

## 2014-05-17 MED ORDER — DEXTROSE 5 % IV SOLN
1.0000 g | Freq: Once | INTRAVENOUS | Status: AC
  Administered 2014-05-17: 1 g via INTRAVENOUS
  Filled 2014-05-17: qty 10

## 2014-05-17 MED ORDER — SODIUM CHLORIDE 0.9 % IV SOLN
INTRAVENOUS | Status: DC
  Administered 2014-05-17: 125 mL/h via INTRAVENOUS

## 2014-05-17 MED ORDER — ASPIRIN EC 325 MG PO TBEC
325.0000 mg | DELAYED_RELEASE_TABLET | Freq: Every day | ORAL | Status: DC
  Administered 2014-05-18 – 2014-05-31 (×12): 325 mg via ORAL
  Filled 2014-05-17 (×16): qty 1

## 2014-05-17 MED ORDER — ACETAMINOPHEN 650 MG RE SUPP: 650.0000 mg | Freq: Four times a day (QID) | RECTAL | Status: DC | PRN

## 2014-05-17 MED ORDER — ONDANSETRON HCL 4 MG/2ML IJ SOLN
4.0000 mg | Freq: Four times a day (QID) | INTRAMUSCULAR | Status: DC | PRN
  Administered 2014-05-29 – 2014-05-31 (×3): 4 mg via INTRAVENOUS
  Filled 2014-05-17 (×3): qty 2

## 2014-05-17 MED ORDER — METOPROLOL TARTRATE 1 MG/ML IV SOLN: 5.0000 mg | INTRAVENOUS | Status: DC | PRN

## 2014-05-17 MED ORDER — METOPROLOL TARTRATE 1 MG/ML IV SOLN
5.0000 mg | INTRAVENOUS | Status: DC | PRN
  Administered 2014-05-18 – 2014-05-22 (×2): 5 mg via INTRAVENOUS
  Filled 2014-05-17 (×2): qty 5

## 2014-05-17 MED ORDER — DEXTROSE 5 % IV SOLN
500.0000 mg | INTRAVENOUS | Status: DC
  Administered 2014-05-17 – 2014-05-20 (×4): 500 mg via INTRAVENOUS
  Filled 2014-05-17 (×5): qty 500

## 2014-05-17 MED ORDER — SODIUM CHLORIDE 0.9 % IV SOLN: INTRAVENOUS | Status: DC

## 2014-05-17 NOTE — ED Notes (Signed)
Urine on the sink in room if needed.

## 2014-05-17 NOTE — H&P (Signed)
[facility] History and Physical  [name] [name] [id number] DOB: June 03, 1941 DOA: 05/17/2014  Referring physician:  PCP: Gwendolyn Grant, MD   Chief Complaint: Nausea/Vomiting/Chest Pain  HPI: [name] [name] is a 73 y.o. female with a past medical history of medication nonadherence, Type II Diabetes Mellitus, Uncontrolled Hypertension, Chronic Hepatitis B, presenting to the emergency room at [facility] with complaints of Nausea/Vomiting/Diarrhea that started early this morning at approximately 2:30 am. This was accompanied by precordial chest pain characterized at nonradiating, sharp/stabbing, that has resolved. She also complains of having a nonproductive cough, generalize weakness, chills, malaise, and overall feeling ill. She denies bloody stools,melena, hematemesis, dysuria, syncope, palpitations. Family present at bedsdie report she has not been acting "herself." In the emergency room she was found to be hypertensive with SBP's in the 240's. Labs revealed an elevated troponin of 3.3, EKG did not show  ST segment changes. Her kidney function also worsening with creatinine increasing to 4.2 from 3.23 on 02/10/2014. Cardiology evaluated patient in the emergency department, felt this could demand ischemia. Did not recommend anticoagulation at this time.      Review of Systems:  Constitutional:  No weight loss, night sweats, Fevers, Positive for chills, fatigue.  HEENT:  No headaches, Difficulty swallowing,Tooth/dental problems,Sore throat,  No sneezing, itching, ear ache, nasal congestion, post nasal drip,  Cardio-vascular:  Positive for chest pain, deniesOrthopnea, PND, swelling in lower extremities, anasarca, dizziness, palpitations  GI:  No heartburn, indigestion, abdominal pain, Positive for nausea, vomiting, diarrhea, change in bowel habits, loss of appetite  Resp:  Positive for shortness of breath with exertion or at rest. No excess mucus, no productive cough, No  non-productive cough, No coughing up of blood.No change in color of mucus.No wheezing.No chest wall deformity  Skin:  no rash or lesions.  GU:  no dysuria, change in color of urine, no urgency or frequency. No flank pain.  Musculoskeletal:  No joint pain or swelling. No decreased range of motion. No back pain.  Psych:  No change in mood or affect. No depression or anxiety. No memory loss.   Past Medical History  Diagnosis Date  . HYPERTENSION   . OSTEOPENIA   . Unspecified vitamin D deficiency   . Headache(784.0)   . POSTMENOPAUSAL STATUS   . Hepatitis B carrier     05/2009: neg Hep C; Hep B: core pos, Surf neg; fatty liver US 8/10 - 7/13  . HYPERLIPIDEMIA   . GLAUCOMA   . GERD   . DIABETES MELLITUS, TYPE I   . DEPRESSION    Past Surgical History  Procedure Laterality Date  . Cholecystectomy  02/12/07  . Tonsillectomy    . Refractive surgery  07/29/09    Dr. Bing Plume   Social History:  reports that she has never smoked. She does not have any smokeless tobacco history on file. She reports that she does not drink alcohol or use illicit drugs.  Allergies  Allergen Reactions  . Hydralazine Itching  . Robaxin [Methocarbamol]     Dizziness     Family History  Problem Relation Age of Onset  . Coronary artery disease Other   . Diabetes Other   . Hypertension Other   . Arthritis Other      Prior to Admission medications   Medication Sig Start Date End Date Taking? Authorizing Provider  acetaminophen (TYLENOL) 500 MG tablet Take 500 mg by mouth every 6 (six) hours as needed for mild pain or headache.   Yes Historical Provider,  MD  amLODipine (NORVASC) 10 MG tablet Take 1 tablet (10 mg total) by mouth daily. 11/05/13  Yes Rowe Clack, MD  Cholecalciferol (VITAMIN D3) 2000 UNITS CHEW Chew 1 tablet by mouth daily.   Yes Historical Provider, MD  furosemide (LASIX) 40 MG tablet Take 40 mg by mouth daily.   Yes Historical Provider, MD  hydrochlorothiazide (HYDRODIURIL) 25 MG  tablet Take 1 tablet (25 mg total) by mouth daily. 11/05/13  Yes Rowe Clack, MD  lisinopril (PRINIVIL,ZESTRIL) 40 MG tablet Take 1 tablet (40 mg total) by mouth daily. 11/05/13  Yes Rowe Clack, MD  metoprolol succinate (TOPROL-XL) 100 MG 24 hr tablet TAKE 1 TABLET BY MOUTH EVERY DAY 03/19/14  Yes Rowe Clack, MD  timolol (BETIMOL) 0.5 % ophthalmic solution Place 1 drop into the right eye every morning.   Yes Historical Provider, MD   Physical Exam: Filed Vitals:   05/17/14 1417 05/17/14 1418 05/17/14 1437  BP: 240/80 244/83   Pulse: 75    Temp: 98.5 F (36.9 C)    TempSrc: Oral    Resp: 18    SpO2: 95%  91%    Wt Readings from Last 3 Encounters:  01/08/14 66.679 kg (147 lb)  12/05/13 67.042 kg (147 lb 12.8 oz)  09/03/13 70.035 kg (154 lb 6.4 oz)    General:  Patient does not appear to be in acute distress, she currently denies chest pain.  Eyes: PERRL, normal lids, irises & conjunctiva ENT: grossly normal hearing, lips & tongue Neck: no LAD, masses or thyromegaly Cardiovascular: RRR, 2/6 SEM. No LE edema. Telemetry: SR, no arrhythmias  Respiratory: Bibasilar crackles are present , no w/r/r. Normal respiratory effort. Abdomen: soft, ntnd Skin: no rash or induration seen on limited exam Musculoskeletal: grossly normal tone BUE/BLE Psychiatric: grossly normal mood and affect, speech fluent and appropriate Neurologic: grossly non-focal.          Labs on Admission:  Basic Metabolic Panel:  Recent Labs Lab 05/17/14 1435  NA 144  K 4.2  CL 107  CO2 24  GLUCOSE 137*  BUN 47*  CREATININE 4.20*  CALCIUM 9.4   Liver Function Tests: No results found for this basename: AST, ALT, ALKPHOS, BILITOT, PROT, ALBUMIN,  in the last 168 hours No results found for this basename: LIPASE, AMYLASE,  in the last 168 hours No results found for this basename: AMMONIA,  in the last 168 hours CBC:  Recent Labs Lab 05/17/14 1435  WBC 4.0  HGB 8.3*  HCT 25.2*  MCV  92.3  PLT 146*   Cardiac Enzymes:  Recent Labs Lab 05/17/14 1435  TROPONINI 3.30*    BNP (last 3 results) No results found for this basename: PROBNP,  in the last 8760 hours CBG: No results found for this basename: GLUCAP,  in the last 168 hours  Radiological Exams on Admission: Dg Chest 2 View  05/17/2014   CLINICAL DATA:  Shortness of breath. Abdominal pain with nausea and vomiting. Current history of hypertension and diabetes.  EXAM: CHEST  2 VIEW  COMPARISON:  02/09/2007, 09/12/2006.  FINDINGS: AP semi-erect and lateral images were obtained. Moderate cardiac enlargement, with significant interval increase in the heart size since 2008, even allowing for differences in technique. Pulmonary venous hypertension and likely minimal interstitial pulmonary edema. Airspace consolidation in the left lower lobe with silhouetting of the left hemidiaphragm. No confluent airspace consolidation elsewhere. Small bilateral pleural effusions. Degenerative changes involving the thoracic spine.  IMPRESSION: 1. Moderate cardiac enlargement  with significant interval increase in heart size since 2008. 2. Pulmonary venous hypertension with perhaps minimal/incipient pulmonary edema. 3. Dense left lower lobe atelectasis versus pneumonia. Pneumonia is favored. 4. Small bilateral pleural effusions.   Electronically Signed   By: Evangeline Dakin M.D.   On: 05/17/2014 15:24    EKG: Independently reviewed.   Assessment/Plan Principal Problem:   Hypertensive emergency Active Problems:   Chest pain   CAP (community acquired pneumonia)   Elevated troponin   Chronic kidney disease, stage IV (severe)   1. Hypertensive Emergency. Evidenced by SBP's in the 240's, elevated troponin, acute on chronic renal failure and patient complaints of chest pain overnight. I suspect medication nonadherance may have contributed. Will start a Nitroglycerin drip for blood pressure control and admit to the step down unit. Cardiology  has been consulted. Continue Metoprolol and Amlodipine, holding ACE given acute on Chronic Renal Failure.  2. NSTEMI. She has multiple cardiovascular risk factors including HTN, diabetes mellitus and dyslipidemia, reporting chest pain early this morning. Labs showed elevated troponin of 3.3. Cardiology consulted. Pending 2-D echo.May be due to demand ischemia. Recommended holding off on anticoagulation for now given elevated blood pressures and concern for cerebral bleed.  3. Acute on Chronic Renal Failure. Initial labs showing creatinine of 4.2, increased from 3.23 on 02/10/2014. I suspect kidney injury may be evidence of end-organ damage from hypertensive emergency. She will be started on a nitroglycerin drip. Provide IV fluids. Repeat labs in AM.   4. Suspected Community Acquired PNA.  A CXR done in the emergency room showed dense left lower lobe disease which could be atelectasis versus pneumonia. Will treat emerically with IV Rocephin and Azithromycin. Blood cultures were obtained in the emergency room.  5. History of Poorly Controlled Diabetes Mellitus. Will check a HgA1C. Accuchecks q ac and q hs, place here on sliding scale coverage.  6. Dyslipidemia. Will order a fasting lipid panel, start Lipitor 40 mg PO q daily 7. DVT Prophylaxis. Heparin      Code Status: Full Code Family Communication: Spoke to family present at bedside Disposition Plan: Will admit to step down unit, anticipate will require greater than 2 night's hospitalization  Time spent: 70 min  Kelvin Cellar [facility] Pager [phone]  **Disclaimer: This note may have been dictated with voice recognition software. Similar sounding words can inadvertently be transcribed and this note may contain transcription errors which may not have been corrected upon publication of note.**

## 2014-05-17 NOTE — Progress Notes (Signed)
Report received from [facility], [place], ED.

## 2014-05-17 NOTE — ED Notes (Signed)
Gave I stat troponin results to MD Reather Converse

## 2014-05-17 NOTE — ED Provider Notes (Signed)
CSN: [id number]     Arrival date & time 05/17/14  1407 History   First MD Initiated Contact with Patient 05/17/14 1517     Chief Complaint  Patient presents with  . Nasal Congestion  . Cough  . Diarrhea     (Consider location/radiation/quality/duration/timing/severity/associated sxs/prior Treatment) Patient is a 73 y.o. female presenting with cough and diarrhea. The history is provided by the patient.  Cough Diarrhea  She complains of cough and congestion in chest for 2 weeks, cough, is not productive. She denies fever. She had some chest pain. Last evening for about 2 hours. It resolved spontaneously. During the night, she had about 20 episodes of diarrhea that. In color from brown to rust color to clear. She had some back pain yesterday as well. She denies dizziness, headache, focal weakness, dysuria, urinary frequency, or leg pain. She's never had cardiac disease. She does not take aspirin, or nitroglycerin.  She is being worked up for renal insufficiency and had to delay a renal biopsy because of persistent hypertension. She is not sure when she will be having a biopsy. There are no other known modifying factors.  Past Medical History  Diagnosis Date  . HYPERTENSION   . OSTEOPENIA   . Unspecified vitamin D deficiency   . Headache(784.0)   . POSTMENOPAUSAL STATUS   . Hepatitis B carrier     05/2009: neg Hep C; Hep B: core pos, Surf neg; fatty liver US 8/10 - 7/13  . HYPERLIPIDEMIA   . GLAUCOMA   . GERD   . DIABETES MELLITUS, TYPE I   . DEPRESSION    Past Surgical History  Procedure Laterality Date  . Cholecystectomy  02/12/07  . Tonsillectomy    . Refractive surgery  07/29/09    Dr. Bing Plume   Family History  Problem Relation Age of Onset  . Coronary artery disease Other   . Diabetes Other   . Hypertension Other   . Arthritis Other    History  Substance Use Topics  . Smoking status: Never Smoker   . Smokeless tobacco: Not on file     Comment: Married x's 81yrs, 6  kids-scattered [employer]. Retired Consulting civil engineer  . Alcohol Use: No   OB History   Grav Para Term Preterm Abortions TAB SAB Ect Mult Living                 Review of Systems  Respiratory: Positive for cough.   Gastrointestinal: Positive for diarrhea.  All other systems reviewed and are negative.     Allergies  Hydralazine and Robaxin  Home Medications   Prior to Admission medications   Medication Sig Start Date End Date Taking? Authorizing Provider  acetaminophen (TYLENOL) 500 MG tablet Take 500 mg by mouth every 6 (six) hours as needed for mild pain or headache.   Yes Historical Provider, MD  amLODipine (NORVASC) 10 MG tablet Take 1 tablet (10 mg total) by mouth daily. 11/05/13  Yes Rowe Clack, MD  Cholecalciferol (VITAMIN D3) 2000 UNITS CHEW Chew 1 tablet by mouth daily.   Yes Historical Provider, MD  furosemide (LASIX) 40 MG tablet Take 40 mg by mouth daily.   Yes Historical Provider, MD  hydrochlorothiazide (HYDRODIURIL) 25 MG tablet Take 1 tablet (25 mg total) by mouth daily. 11/05/13  Yes Rowe Clack, MD  lisinopril (PRINIVIL,ZESTRIL) 40 MG tablet Take 1 tablet (40 mg total) by mouth daily. 11/05/13  Yes Rowe Clack, MD  metoprolol succinate (TOPROL-XL) 100 MG 24 hr  tablet TAKE 1 TABLET BY MOUTH EVERY DAY 03/19/14  Yes Rowe Clack, MD  timolol (BETIMOL) 0.5 % ophthalmic solution Place 1 drop into the right eye every morning.   Yes Historical Provider, MD   BP 240/110  Pulse 75  Temp(Src) 98.5 F (36.9 C) (Oral)  Resp 18  SpO2 100% Physical Exam  Nursing note and vitals reviewed. Constitutional: She is oriented to person, place, and time. She appears well-developed.  Elderly, frail  HENT:  Head: Normocephalic and atraumatic.  Eyes: Conjunctivae and EOM are normal. Pupils are equal, round, and reactive to light.  Neck: Normal range of motion and phonation normal. Neck supple.  Cardiovascular: Normal rate, regular rhythm and intact distal pulses.    Pulmonary/Chest: Breath sounds normal. No respiratory distress. She has no wheezes. She exhibits no tenderness.  Rhonchi left greater than right base. No associated wheezing or rales  Abdominal: Soft. She exhibits no distension. There is no tenderness. There is no guarding.  Musculoskeletal: Normal range of motion.  Neurological: She is alert and oriented to person, place, and time. She exhibits normal muscle tone.  Skin: Skin is warm and dry.  Psychiatric: She has a normal mood and affect. Her behavior is normal. Judgment and thought content normal.    ED Course  Procedures (including critical care time)  Medications  cefTRIAXone (ROCEPHIN) 1 g in dextrose 5 % 50 mL IVPB (1 g Intravenous New Bag/Given 05/17/14 1656)  azithromycin (ZITHROMAX) 500 mg in dextrose 5 % 250 mL IVPB (not administered)  0.9 %  sodium chloride infusion (not administered)  nitroGLYCERIN 0.2 mg/mL in dextrose 5 % infusion (not administered)  aspirin chewable tablet 324 mg (324 mg Oral Given 05/17/14 1620)  sodium chloride 0.9 % bolus 500 mL (500 mLs Intravenous New Bag/Given 05/17/14 1656)    Patient Vitals for the past 24 hrs:  BP Temp Temp src Pulse Resp SpO2  05/17/14 1711 - - - - - 100 %  05/17/14 1710 240/110 mmHg - - - - 84 %  05/17/14 1437 - - - - - 91 %  05/17/14 1418 244/83 mmHg - - - - -  05/17/14 1417 240/80 mmHg 98.5 F (36.9 C) Oral 75 18 95 %   16:00- cardiology consultation, requested. Dr. Harl Bowie responded, and will evaluate the pt. As a Optometrist.  4:16 PM-Consult complete with Dr. Coralyn Pear. Patient case explained and discussed. He agrees to admit patient for further evaluation and treatment. Call ended at 16:20  4:35 PM Reevaluation with update and discussion. After initial assessment and treatment, an updated evaluation reveals [place]. Christo Hain L   16:40- Left arm manual BP by me 250/110- NTG drip ordered for additional BP control. Discussed with Dr.  Coralyn Pear.    CRITICAL CARE Performed by: Richarda Blade Total critical care time: 50 minutes Critical care time was exclusive of separately billable procedures and treating other patients. Critical care was necessary to treat or prevent imminent or life-threatening deterioration. Critical care was time spent personally by me on the following activities: development of treatment plan with patient and/or surrogate as well as nursing, discussions with consultants, evaluation of patient's response to treatment, examination of patient, obtaining history from patient or surrogate, ordering and performing treatments and interventions, ordering and review of laboratory studies, ordering and review of radiographic studies, pulse oximetry and re-evaluation of patient's condition.   Labs Review Labs Reviewed  CBC - Abnormal; Notable for the following:    RBC 2.73 (*)    Hemoglobin  8.3 (*)    HCT 25.2 (*)    Platelets 146 (*)    All other components within normal limits  BASIC METABOLIC PANEL - Abnormal; Notable for the following:    Glucose, Bld 137 (*)    BUN 47 (*)    Creatinine, Ser 4.20 (*)    GFR calc non Af Amer 10 (*)    GFR calc Af Amer 11 (*)    All other components within normal limits  TROPONIN I - Abnormal; Notable for the following:    Troponin I 3.30 (*)    All other components within normal limits  I-STAT TROPOININ, ED - Abnormal; Notable for the following:    Troponin i, poc 2.63 (*)    All other components within normal limits  STOOL CULTURE  CLOSTRIDIUM DIFFICILE BY PCR  CULTURE, BLOOD (ROUTINE X 2)  CULTURE, BLOOD (ROUTINE X 2)  POC OCCULT BLOOD, ED  I-STAT CG4 LACTIC ACID, ED    Imaging Review Dg Chest 2 View  05/17/2014   CLINICAL DATA:  Shortness of breath. Abdominal pain with nausea and vomiting. Current history of hypertension and diabetes.  EXAM: CHEST  2 VIEW  COMPARISON:  02/09/2007, 09/12/2006.  FINDINGS: AP semi-erect and lateral images were obtained.  Moderate cardiac enlargement, with significant interval increase in the heart size since 2008, even allowing for differences in technique. Pulmonary venous hypertension and likely minimal interstitial pulmonary edema. Airspace consolidation in the left lower lobe with silhouetting of the left hemidiaphragm. No confluent airspace consolidation elsewhere. Small bilateral pleural effusions. Degenerative changes involving the thoracic spine.  IMPRESSION: 1. Moderate cardiac enlargement with significant interval increase in heart size since 2008. 2. Pulmonary venous hypertension with perhaps minimal/incipient pulmonary edema. 3. Dense left lower lobe atelectasis versus pneumonia. Pneumonia is favored. 4. Small bilateral pleural effusions.   Electronically Signed   By: Evangeline Dakin M.D.   On: 05/17/2014 15:24      Date: 05/17/14- 14:36  Rate: 82  Rhythm: normal sinus rhythm  QRS Axis: normal  PR and QT Intervals: borderline prolonged QTc  ST/T Wave abnormalities: nonspecific ST changes  PR and QRS Conduction Disutrbances:none  Narrative Interpretation:   Old EKG Reviewed: none available    EKG Interpretation  Date/Time:  Saturday May 17 2014 15:18:32 EDT Ventricular Rate:  80 PR Interval:  151 QRS Duration: 86 QT Interval:  405 QTC Calculation: 467 R Axis:   56 Text Interpretation:  Sinus rhythm Consider left ventricular hypertrophy Since last tracing of earlier today No significant change was found Confirmed by Eulis Foster  MD, Vira Agar [id number]) on 05/17/2014 3:24:58 PM        MDM   Final diagnoses:  Community acquired pneumonia  ACS (acute coronary syndrome)  Essential hypertension  Renal insufficiency    Nonspecific symptoms of possible pneumonia, complicated by a debilitated state, and renal insufficiency. Incidental, troponin elevation without comparison labs, available, in patient with significant renal insufficiency, and hypertension. Possible ACLF, although patient is chest  pain-free at this time. She will require admission for further eval.  Nursing Notes Reviewed/ Care Coordinated, and agree without changes. Applicable Imaging Reviewed.  Interpretation of Laboratory Data incorporated into ED treatment  Plan: Admit    Richarda Blade, MD 05/17/14 [phone]

## 2014-05-17 NOTE — Consult Note (Signed)
Admit date: 05/17/2014 Referring Physician  Dr. Coralyn Pear Primary Physician  Dr. Asa Lente Primary Cardiologist  None Reason for Consultation  Elevated troponin  HPI: This is a 73yo AAF with a history of HTN, Hep B, dyslipidemia and DM type I who presented to the ER with complaints of cough and chest congestion for 2 weeks.  Cough is nonproductive and she denies fever.  She has also had some CP last evening that lasted about 2 hours and resolved spontaneously.  During the night she had about 20 episodes of diarrhea.  She is being worked up for renal insuff but renal biopsy was delayed due to HTN.  She was found to have an elevated troponin on admission and we are now asked to consult.       PMH:   Past Medical History  Diagnosis Date  . HYPERTENSION   . OSTEOPENIA   . Unspecified vitamin D deficiency   . Headache(784.0)   . POSTMENOPAUSAL STATUS   . Hepatitis B carrier     05/2009: neg Hep C; Hep B: core pos, Surf neg; fatty liver US 8/10 - 7/13  . HYPERLIPIDEMIA   . GLAUCOMA   . GERD   . DIABETES MELLITUS, TYPE I   . DEPRESSION      PSH:   Past Surgical History  Procedure Laterality Date  . Cholecystectomy  02/12/07  . Tonsillectomy    . Refractive surgery  07/29/09    Dr. Bing Plume    Allergies:  Hydralazine and Robaxin Prior to Admit Meds:   (Not in a hospital admission) Fam HX:    Family History  Problem Relation Age of Onset  . Coronary artery disease Other   . Diabetes Other   . Hypertension Other   . Arthritis Other    Social HX:    History   Social History  . Marital Status: Married    Spouse Name: N/A    Number of Children: N/A  . Years of Education: N/A   Occupational History  . Not on file.   Social History Main Topics  . Smoking status: Never Smoker   . Smokeless tobacco: Not on file     Comment: Married x's 21yrs, 6 kids-scattered [employer]. Retired Consulting civil engineer  . Alcohol Use: No  . Drug Use: No  . Sexual Activity: Not on file   Other Topics  Concern  . Not on file   Social History Narrative  . No narrative on file     ROS:  All 11 ROS were addressed and are negative except what is stated in the HPI  Physical Exam: Blood pressure 244/83, pulse 75, temperature 98.5 F (36.9 C), temperature source Oral, resp. rate 18, SpO2 91.00%.    General: Well developed, well nourished, in no acute distress Head: Eyes PERRLA, No xanthomas.   Normal cephalic and atramatic  Lungs:   Crackles at bases bilaterally Heart:   HRRR S1 S2 Pulses are 2+ & equal.            No carotid bruit. No JVD.  No abdominal bruits. No femoral bruits. Abdomen: Bowel sounds are positive, abdomen soft and non-tender without masses  Extremities:   No clubbing, cyanosis or edema.  DP +1 Neuro: Alert and oriented X 3. Psych:  Good affect, responds appropriately    Labs:   Lab Results  Component Value Date   WBC 4.0 05/17/2014   HGB 8.3* 05/17/2014   HCT 25.2* 05/17/2014   MCV 92.3 05/17/2014   PLT 146*  05/17/2014    Recent Labs Lab 05/17/14 1435  NA 144  K 4.2  CL 107  CO2 24  BUN 47*  CREATININE 4.20*  CALCIUM 9.4  GLUCOSE 137*   No results found for this basename: PTT   Lab Results  Component Value Date   INR 0.92 02/10/2014   Lab Results  Component Value Date   TROPONINI 3.30* 05/17/2014     Lab Results  Component Value Date   CHOL 301* 04/26/2013   CHOL 312* 04/27/2012   CHOL 241* 12/17/2010   Lab Results  Component Value Date   HDL 78.10 04/26/2013   HDL 64.90 04/27/2012   HDL 84.10 12/17/2010   No results found for this basename: [facility]   Lab Results  Component Value Date   TRIG 134.0 04/26/2013   TRIG 150.0* 04/27/2012   TRIG 176.0* 12/17/2010   Lab Results  Component Value Date   CHOLHDL 4 04/26/2013   CHOLHDL 5 04/27/2012   CHOLHDL 3 12/17/2010   Lab Results  Component Value Date   LDLDIRECT 196.2 04/26/2013   LDLDIRECT 195.7 04/27/2012   LDLDIRECT 138.9 12/17/2010      Radiology:  Dg Chest 2 View  05/17/2014    CLINICAL DATA:  Shortness of breath. Abdominal pain with nausea and vomiting. Current history of hypertension and diabetes.  EXAM: CHEST  2 VIEW  COMPARISON:  02/09/2007, 09/12/2006.  FINDINGS: AP semi-erect and lateral images were obtained. Moderate cardiac enlargement, with significant interval increase in the heart size since 2008, even allowing for differences in technique. Pulmonary venous hypertension and likely minimal interstitial pulmonary edema. Airspace consolidation in the left lower lobe with silhouetting of the left hemidiaphragm. No confluent airspace consolidation elsewhere. Small bilateral pleural effusions. Degenerative changes involving the thoracic spine.  IMPRESSION: 1. Moderate cardiac enlargement with significant interval increase in heart size since 2008. 2. Pulmonary venous hypertension with perhaps minimal/incipient pulmonary edema. 3. Dense left lower lobe atelectasis versus pneumonia. Pneumonia is favored. 4. Small bilateral pleural effusions.   Electronically Signed   By: Evangeline Dakin M.D.   On: 05/17/2014 15:24    EKG:  NSR with marked LVH with secondary repolarization changes  ASSESSMENT:  1.  NSTEMI with elevated troponin and history of CP last PM in the setting of renal failure and marked HTN.   2.  Acute on chronic renal insuff 3.  Cough and chest congestion for 2 weeks with chest xray showing moderate CM with minimal pulmonary edema and dense lower lobe atelectasis vs. PNA.  She is afebrile and CBC is normal.   4.  Anemia that has been trending downward since November 5.  Protein calorie malnutrition with low albumin 6.  N/V and diarrhea 7.  Hypertensive urgency - she has no current CP and no history of back pain.  Her pulses are equal in her extremities  PLAN:   1.  Her NSTEMI may be type 2 demand ischemia from very elevated BP in setting of acute renal failure.  She did have CP last PM although again, this may be due to marked HTN.  She has multiple CRF  including HTN, dyslipidemia and DM.  2.  Will get 2D echo to assess LVF and LV size given CM on chest xray 3.  Start IV NTG gtt for BP control 4.  Cycle cardiac enzymes 5.  Further cardiac workup pending results of echo.  If LVF normal consider nuclear stress test with cath only if she has a very high risk stress  test given her worsening renal function 6.  Needs aggressive BP control - continue amlodipine/metoprolol and titrated NTG gtt.   7.  Ultimately should be anticoagulated but would hold off until her BP is under better control due to risk of cerebral bleed in setting of severely elevated BP and anticoagulation. 7.  Hold ACE I and diuretics in setting of worsening renal failure 8.  Start ASA and statin and check FLP in am  Sueanne Margarita, MD  05/17/2014  4:57 PM

## 2014-05-17 NOTE — ED Notes (Signed)
Will notify MD and RN of b/p

## 2014-05-17 NOTE — ED Notes (Signed)
Patient transported to X-ray 

## 2014-05-17 NOTE — ED Notes (Signed)
Notified MD - Dr. Audie Pinto and RN of pt bp and symptoms.  No new orders given by MD.

## 2014-05-17 NOTE — ED Notes (Signed)
Pt states for past week she has had cough/congestion/diarrhea.  Pt states off/on for 2 weeks.  Pt cannot tell if fever.  Pt does have episodes of vomiting.  HX DM, HTN

## 2014-05-17 NOTE — ED Notes (Signed)
MD at bedside. 

## 2014-05-18 ENCOUNTER — Inpatient Hospital Stay (HOSPITAL_COMMUNITY): Payer: Medicare HMO

## 2014-05-18 ENCOUNTER — Other Ambulatory Visit (HOSPITAL_COMMUNITY): Payer: Medicare HMO

## 2014-05-18 DIAGNOSIS — I379 Nonrheumatic pulmonary valve disorder, unspecified: Secondary | ICD-10-CM

## 2014-05-18 LAB — LIPID PANEL
CHOL/HDL RATIO: 3.6 ratio
Cholesterol: 239 mg/dL — ABNORMAL HIGH (ref 0–200)
HDL: 66 mg/dL (ref >39)
LDL Cholesterol: 143 mg/dL — ABNORMAL HIGH (ref 0–99)
Triglycerides: 149 mg/dL (ref <150)
VLDL: 30 mg/dL (ref 0–40)

## 2014-05-18 LAB — TSH: TSH: 3.14 u[IU]/mL (ref 0.350–4.500)

## 2014-05-18 LAB — BASIC METABOLIC PANEL
Anion gap: 11 (ref 5–15)
BUN: 47 mg/dL — AB (ref 6–23)
CO2: 24 mEq/L (ref 19–32)
CREATININE: 4.06 mg/dL — AB (ref 0.50–1.10)
Calcium: 8.3 mg/dL — ABNORMAL LOW (ref 8.4–10.5)
Chloride: 109 mEq/L (ref 96–112)
GFR calc non Af Amer: 10 mL/min — ABNORMAL LOW (ref >90)
GFR, EST AFRICAN AMERICAN: 12 mL/min — AB (ref >90)
Glucose, Bld: 122 mg/dL — ABNORMAL HIGH (ref 70–99)
Potassium: 3.9 mEq/L (ref 3.7–5.3)
Sodium: 144 mEq/L (ref 137–147)

## 2014-05-18 LAB — CBC
HCT: 20 % — ABNORMAL LOW (ref 36.0–46.0)
Hemoglobin: 6.6 g/dL — CL (ref 12.0–15.0)
MCH: 30.7 pg (ref 26.0–34.0)
MCHC: 33 g/dL (ref 30.0–36.0)
MCV: 93 fL (ref 78.0–100.0)
Platelets: 129 10*3/uL — ABNORMAL LOW (ref 150–400)
RBC: 2.15 MIL/uL — ABNORMAL LOW (ref 3.87–5.11)
RDW: 14.6 % (ref 11.5–15.5)
WBC: 5 10*3/uL (ref 4.0–10.5)

## 2014-05-18 LAB — GLUCOSE, CAPILLARY
GLUCOSE-CAPILLARY: 113 mg/dL — AB (ref 70–99)
GLUCOSE-CAPILLARY: 139 mg/dL — AB (ref 70–99)
Glucose-Capillary: 113 mg/dL — ABNORMAL HIGH (ref 70–99)
Glucose-Capillary: 105 mg/dL — ABNORMAL HIGH (ref 70–99)

## 2014-05-18 LAB — HEMOGLOBIN A1C
HEMOGLOBIN A1C: 5.7 % — AB (ref <5.7)
Mean Plasma Glucose: 117 mg/dL — ABNORMAL HIGH (ref <117)

## 2014-05-18 LAB — PREPARE RBC (CROSSMATCH)

## 2014-05-18 LAB — ABO/RH: ABO/RH(D): B POS

## 2014-05-18 LAB — TROPONIN I
Troponin I: 4.6 ng/mL (ref <0.30)
Troponin I: 3.19 ng/mL (ref <0.30)

## 2014-05-18 MED ORDER — NICARDIPINE HCL IN NACL 20-0.86 MG/200ML-% IV SOLN
3.0000 mg/h | INTRAVENOUS | Status: DC
  Administered 2014-05-18: 5 mg/h via INTRAVENOUS
  Administered 2014-05-19 (×2): 2.5 mg/h via INTRAVENOUS
  Administered 2014-05-19: 5 mg/h via INTRAVENOUS
  Administered 2014-05-20: 2 mg/h via INTRAVENOUS
  Filled 2014-05-18 (×5): qty 200

## 2014-05-18 MED ORDER — LABETALOL HCL 5 MG/ML IV SOLN
5.0000 mg | Freq: Once | INTRAVENOUS | Status: AC
  Administered 2014-05-18: 5 mg via INTRAVENOUS
  Filled 2014-05-18: qty 4

## 2014-05-18 NOTE — Progress Notes (Signed)
Patient ID: [name] [name], female   DOB: 1941-10-08, 73 y.o.   MRN: [id number]     Subjective:    No chest pain  Objective:   Temp:  [97.1 F (36.2 C)-98.9 F (37.2 C)] 98.9 F (37.2 C) (07/19 0400) Pulse Rate:  [60-106] 83 (07/19 0700) Resp:  [14-27] 21 (07/19 0700) BP: (152-268)/(68-135) 173/85 mmHg (07/19 0700) SpO2:  [84 %-100 %] 96 % (07/19 0700) Weight:  [140 lb 3.4 oz (63.6 kg)-146 lb 9.7 oz (66.5 kg)] 146 lb 9.7 oz (66.5 kg) (07/19 0400) Last BM Date: 05/17/14  Filed Weights   05/17/14 1750 05/18/14 0400  Weight: 140 lb 3.4 oz (63.6 kg) 146 lb 9.7 oz (66.5 kg)    Intake/Output Summary (Last 24 hours) at 05/18/14 0730 Last data filed at 05/18/14 0600  Gross per 24 hour  Intake 985.75 ml  Output    450 ml  Net 535.75 ml    Telemetry: NSR  Exam:  General:NAD  Resp:CTAB  Cardiac: RRR, no m/r/g, no JVD  GI: abdomen soft, NT, ND  MSK: no LE edema  Neuro: no focal deficits  Psych: appropriate affect  Lab Results:  Basic Metabolic Panel:  Recent Labs Lab 05/17/14 1435 05/18/14 0622  NA 144 144  K 4.2 3.9  CL 107 109  CO2 24 24  GLUCOSE 137* 122*  BUN 47* 47*  CREATININE 4.20* 4.06*  CALCIUM 9.4 8.3*    Liver Function Tests: No results found for this basename: AST, ALT, ALKPHOS, BILITOT, PROT, ALBUMIN,  in the last 168 hours  CBC:  Recent Labs Lab 05/17/14 1435 05/18/14 0622  WBC 4.0 5.0  HGB 8.3* 6.6*  HCT 25.2* 20.0*  MCV 92.3 93.0  PLT 146* 129*    Cardiac Enzymes:  Recent Labs Lab 05/17/14 1815 05/18/14 0005 05/18/14 0622  TROPONINI 3.16* 4.60* 3.19*    BNP:  Recent Labs  05/17/14 1956  PROBNP 20847.0*    Coagulation:  Recent Labs Lab 05/17/14 1815  INR 1.05    ECG:   Medications:   Scheduled Medications: . amLODipine  10 mg Oral Daily  . antiseptic oral rinse  15 mL Mouth Rinse BID  . aspirin EC  325 mg Oral Daily  . atorvastatin  40 mg Oral q1800  . azithromycin  500 mg Intravenous Q24H  .  cefTRIAXone (ROCEPHIN)  IV  1 g Intravenous Q24H  . heparin  5,000 Units Subcutaneous 3 times per day  . insulin aspart  0-15 Units Subcutaneous TID WC  . insulin aspart  0-5 Units Subcutaneous QHS  . metoprolol succinate  100 mg Oral Daily  . sodium chloride  3 mL Intravenous Q12H  . timolol  1 drop Right Eye q morning - 10a     Infusions: . sodium chloride 75 mL/hr (05/17/14 1815)  . nitroGLYCERIN 200 mcg/min (05/18/14 0600)     PRN Medications:  acetaminophen, acetaminophen, alum & mag hydroxide-simeth, metoprolol, morphine injection, ondansetron (ZOFRAN) IV, ondansetron, oxyCODONE     Assessment/Plan    73 yo female hx of HTN, Hep B, HL, DM, and CKD admitted with cough, SOB, chest pain.   1. Hypertensive emergency - initial SBPs in the 240s (MAP 163), evidence of end organ damage with NSTEMI and AKI on CKD - goal MAP approx 120, goal MAP decrease in HTN emergency in 25% over 24 hrs.  - bp's were initially controlled, now bp has increased on nitro 200. - will order labetalol 5mg  IV x1, avoid over rapid correction  of bp. She has listed hydralazine allergy. If not effective consider starting nicardapine drip with goal MAP titration of 120.   2. NSTEMI - peak trop 4.6 and trending down, EKG without specific ischemic changes. Occurred in setting of severe HTN and possible pneumonia as well - she is on ASA, beta blocker, statin. No ACE due to renal dysfunction. Anticoag held in setting of severe HTN. Now with progression of anemia, continue to hold - f/u echo - pending echo results and response to current medical therapy, decide on non-invasive vs invasive ischemic testing. High risk for progression of her baseline renal dysfunction with cath. Her anemia would also be a concern.   3. Anemia - per primary team     Carlyle Dolly, M.D., F.A.C.C.

## 2014-05-18 NOTE — Progress Notes (Signed)
[facility] PROGRESS NOTE  [name] [name] [id number] DOB: 1941-08-12 DOA: 05/17/2014 PCP: Gwendolyn Grant, MD  Assessment/Plan: 1. Hypertensive Emergency.  -Evidenced by SBP's in the 240's, elevated troponin, acute on chronic renal failure and patient complaints of chest pain  -Remaining hypertensive overnight for which she was administered PRN IV Lopressor.  -Case discussed with cardiology, will give Lopressor IV x 1.  -Goal MAP 110-120's.  Its possible that she may require a second IV drip if blood pressures do not trend down.   2. NSTEMI -Troponin peaked at 4.6 overnight, trending down to 3.19 on AM labs work -She is CP free this morning.  -Cardiology did not recommend initiating anticoagulation given presence of uncontrolled hypertension -Will continue medical management with ASA, Statin, Beta Blocker, Nitrates. ACE discontinued due to Acute of chronic renal failure.  -2D Echo pending  3. Acute Anemia -Hg trending down to 6.6. She does not appear to have evidence of active bleeding. -She was typed and cross and will plan to transfuse 2 units of PRBC's  4. Acute on Chronic Renal Failure -Kidney injury may be evidence of end-organ damage from hypertensive emergency -ACE discontinued -Will work towards controlling HTN, may need a second drip  5. Possible Community Acquired Pneumonia -A CXR showed dense lower lobe airspace disease, started on emperic IV Azithromycin and Rocephin -Blood cultures and sputum cultures pending  6.  Diabetes Mellitus -Continue accuchecks q ac and q hs with sliding scale coverage   Code Status: Full Code Family Communication: Spoke with family present at bedside Disposition Plan: Continue close monitoring in Step Down.    Consultants:  Cardiology   Antibiotics:  Azithromycin  Rocephin  HPI/Subjective: [name] [name] is a 73 y.o. female with a past medical history of medication nonadherence, Type II Diabetes Mellitus,  Uncontrolled Hypertension, Chronic Hepatitis B, presenting to the emergency room at [facility] with complaints of Nausea/Vomiting/Diarrhea as well as chest pain. She was found to be hypertensive having a blood pressures in the 240 range. Labs showed the development of acute on chronic renal failure. She was admitted to the step unit, admitted for hypertensive emergency, started on IV Nitro.    Objective: Filed Vitals:   05/18/14 0700  BP: 173/85  Pulse: 83  Temp:   Resp: 21    Intake/Output Summary (Last 24 hours) at 05/18/14 0829 Last data filed at 05/18/14 0600  Gross per 24 hour  Intake 985.75 ml  Output    450 ml  Net 535.75 ml   Filed Weights   05/17/14 1750 05/18/14 0400  Weight: 63.6 kg (140 lb 3.4 oz) 66.5 kg (146 lb 9.7 oz)    Exam:   General:  She is awake and alert, oriented to person and place  Cardiovascular: Regular rate and rhythm normal S1S2, 2/5 SEM  Respiratory: Normal inspiratory effort, has bibasilar crackles, positive rhonchi  Abdomen: Soft, nontender, nondistended  Musculoskeletal: No edema  Data Reviewed: Basic Metabolic Panel:  Recent Labs Lab 05/17/14 1435 05/18/14 0622  NA 144 144  K 4.2 3.9  CL 107 109  CO2 24 24  GLUCOSE 137* 122*  BUN 47* 47*  CREATININE 4.20* 4.06*  CALCIUM 9.4 8.3*   Liver Function Tests: No results found for this basename: AST, ALT, ALKPHOS, BILITOT, PROT, ALBUMIN,  in the last 168 hours No results found for this basename: LIPASE, AMYLASE,  in the last 168 hours No results found for this basename: AMMONIA,  in the last 168 hours CBC:  Recent  Labs Lab 05/17/14 1435 05/18/14 0622  WBC 4.0 5.0  HGB 8.3* 6.6*  HCT 25.2* 20.0*  MCV 92.3 93.0  PLT 146* 129*   Cardiac Enzymes:  Recent Labs Lab 05/17/14 1435 05/17/14 1815 05/18/14 0005 05/18/14 0622  TROPONINI 3.30* 3.16* 4.60* 3.19*   BNP (last 3 results)  Recent Labs  05/17/14 1956  PROBNP 20847.0*   CBG:  Recent Labs Lab  05/17/14 2121  GLUCAP 219*    Recent Results (from the past 240 hour(s))  CLOSTRIDIUM DIFFICILE BY PCR     Status: None   Collection Time    05/17/14  4:01 PM      Result Value Ref Range Status   C difficile by pcr NEGATIVE  NEGATIVE Final   Comment: Performed at [facility]  MRSA PCR SCREENING     Status: None   Collection Time    05/17/14  5:40 PM      Result Value Ref Range Status   MRSA by PCR NEGATIVE  NEGATIVE Final   Comment:            The GeneXpert MRSA Assay (FDA     approved for NASAL specimens     only), is one component of a     comprehensive MRSA colonization     surveillance program. It is not     intended to diagnose MRSA     infection nor to guide or     monitor treatment for     MRSA infections.     Studies: Dg Chest 2 View  05/17/2014   CLINICAL DATA:  Shortness of breath. Abdominal pain with nausea and vomiting. Current history of hypertension and diabetes.  EXAM: CHEST  2 VIEW  COMPARISON:  02/09/2007, 09/12/2006.  FINDINGS: AP semi-erect and lateral images were obtained. Moderate cardiac enlargement, with significant interval increase in the heart size since 2008, even allowing for differences in technique. Pulmonary venous hypertension and likely minimal interstitial pulmonary edema. Airspace consolidation in the left lower lobe with silhouetting of the left hemidiaphragm. No confluent airspace consolidation elsewhere. Small bilateral pleural effusions. Degenerative changes involving the thoracic spine.  IMPRESSION: 1. Moderate cardiac enlargement with significant interval increase in heart size since 2008. 2. Pulmonary venous hypertension with perhaps minimal/incipient pulmonary edema. 3. Dense left lower lobe atelectasis versus pneumonia. Pneumonia is favored. 4. Small bilateral pleural effusions.   Electronically Signed   By: Evangeline Dakin M.D.   On: 05/17/2014 15:24    Scheduled Meds: . amLODipine  10 mg Oral Daily  . antiseptic oral rinse  15  mL Mouth Rinse BID  . aspirin EC  325 mg Oral Daily  . atorvastatin  40 mg Oral q1800  . azithromycin  500 mg Intravenous Q24H  . cefTRIAXone (ROCEPHIN)  IV  1 g Intravenous Q24H  . heparin  5,000 Units Subcutaneous 3 times per day  . insulin aspart  0-15 Units Subcutaneous TID WC  . insulin aspart  0-5 Units Subcutaneous QHS  . metoprolol succinate  100 mg Oral Daily  . sodium chloride  3 mL Intravenous Q12H  . timolol  1 drop Right Eye q morning - 10a   Continuous Infusions: . nitroGLYCERIN 200 mcg/min (05/18/14 0600)    Principal Problem:   Hypertensive emergency Active Problems:   Chest pain   CAP (community acquired pneumonia)   Elevated troponin   Chronic kidney disease, stage IV (severe)   NSTEMI (non-ST elevated myocardial infarction)    Time spent: 40 min  Kelvin Cellar  [facility] Pager [phone]. If 7PM-7AM, please contact night-coverage at [email or web address], password [facility] 05/18/2014, 8:29 AM  LOS: 1 day

## 2014-05-18 NOTE — Progress Notes (Signed)
  Echocardiogram 2D Echocardiogram has been performed.  Darlina Sicilian M 05/18/2014, 11:44 AM

## 2014-05-19 LAB — BASIC METABOLIC PANEL
Anion gap: 14 (ref 5–15)
BUN: 48 mg/dL — AB (ref 6–23)
CO2: 21 mEq/L (ref 19–32)
CREATININE: 3.99 mg/dL — AB (ref 0.50–1.10)
Calcium: 8.2 mg/dL — ABNORMAL LOW (ref 8.4–10.5)
Chloride: 103 mEq/L (ref 96–112)
GFR calc Af Amer: 12 mL/min — ABNORMAL LOW (ref >90)
GFR, EST NON AFRICAN AMERICAN: 10 mL/min — AB (ref >90)
Glucose, Bld: 85 mg/dL (ref 70–99)
Potassium: 3.9 mEq/L (ref 3.7–5.3)
Sodium: 138 mEq/L (ref 137–147)

## 2014-05-19 LAB — TYPE AND SCREEN
ABO/RH(D): B POS
Antibody Screen: NEGATIVE
Unit division: 0
Unit division: 0

## 2014-05-19 LAB — CBC
HCT: 27 % — ABNORMAL LOW (ref 36.0–46.0)
Hemoglobin: 9.2 g/dL — ABNORMAL LOW (ref 12.0–15.0)
MCH: 29.2 pg (ref 26.0–34.0)
MCHC: 34.1 g/dL (ref 30.0–36.0)
MCV: 85.7 fL (ref 78.0–100.0)
PLATELETS: 106 10*3/uL — AB (ref 150–400)
RBC: 3.15 MIL/uL — ABNORMAL LOW (ref 3.87–5.11)
RDW: 16.5 % — ABNORMAL HIGH (ref 11.5–15.5)
WBC: 4.6 10*3/uL (ref 4.0–10.5)

## 2014-05-19 LAB — GLUCOSE, CAPILLARY
Glucose-Capillary: 72 mg/dL (ref 70–99)
Glucose-Capillary: 107 mg/dL — ABNORMAL HIGH (ref 70–99)
Glucose-Capillary: 118 mg/dL — ABNORMAL HIGH (ref 70–99)

## 2014-05-19 MED ORDER — METOPROLOL SUCCINATE ER 25 MG PO TB24
100.0000 mg | ORAL_TABLET | Freq: Every day | ORAL | Status: DC
  Administered 2014-05-19 – 2014-05-21 (×3): 100 mg via ORAL
  Filled 2014-05-19 (×3): qty 4

## 2014-05-19 MED ORDER — SODIUM CHLORIDE 0.9 % IV BOLUS (SEPSIS)
1000.0000 mL | Freq: Once | INTRAVENOUS | Status: AC
  Administered 2014-05-19: 1000 mL via INTRAVENOUS

## 2014-05-19 MED ORDER — CLONIDINE HCL 0.1 MG PO TABS
0.1000 mg | ORAL_TABLET | Freq: Three times a day (TID) | ORAL | Status: DC
  Administered 2014-05-19: 0.1 mg via ORAL
  Filled 2014-05-19: qty 1

## 2014-05-19 MED ORDER — SODIUM CHLORIDE 0.9 % IV BOLUS (SEPSIS)
500.0000 mL | Freq: Once | INTRAVENOUS | Status: AC
  Administered 2014-05-19: 500 mL via INTRAVENOUS

## 2014-05-19 MED ORDER — BOOST PLUS PO LIQD
237.0000 mL | Freq: Two times a day (BID) | ORAL | Status: DC
  Administered 2014-05-19 – 2014-05-20 (×3): 237 mL via ORAL
  Filled 2014-05-19 (×5): qty 237

## 2014-05-19 MED ORDER — CLONIDINE HCL 0.1 MG PO TABS
0.2000 mg | ORAL_TABLET | Freq: Three times a day (TID) | ORAL | Status: DC
  Administered 2014-05-19: 0.2 mg via ORAL
  Filled 2014-05-19: qty 2

## 2014-05-19 MED ORDER — CLONIDINE HCL 0.3 MG PO TABS
0.3000 mg | ORAL_TABLET | Freq: Three times a day (TID) | ORAL | Status: DC
  Administered 2014-05-19 – 2014-05-28 (×25): 0.3 mg via ORAL
  Filled 2014-05-19 (×29): qty 1

## 2014-05-19 MED ORDER — OXYMETAZOLINE HCL 0.05 % NA SOLN
1.0000 | Freq: Once | NASAL | Status: AC
  Administered 2014-05-19: 1 via NASAL
  Filled 2014-05-19: qty 15

## 2014-05-19 MED ORDER — OXYMETAZOLINE HCL 0.05 % NA SOLN
2.0000 | Freq: Once | NASAL | Status: DC
  Filled 2014-05-19: qty 15

## 2014-05-19 NOTE — Progress Notes (Signed)
Nutrition Brief Note  Patient identified on the Malnutrition Screening Tool (MST) Report  Wt Readings from Last 5 Encounters:  05/19/14 147 lb 0.8 oz (66.7 kg)  01/08/14 147 lb (66.679 kg)  12/05/13 147 lb 12.8 oz (67.042 kg)  09/03/13 154 lb 6.4 oz (70.035 kg)  04/26/13 156 lb 12.8 oz (71.124 kg)     Body mass index is 23.75 kg/(m^2). Patient meets criteria for normal weight based on current BMI.   Current diet order is heart healthy, patient is consuming approximately 50% of meals at this time. Labs and medications reviewed. Hx of type 2 DM, uncontrolled HTN, chronic hepatitis B, presented to ED morning of 7/18 with complaints of nausea/vomiting/diarrhea and chest pain. Found to have NSTEMI. Met with pt today who reports eating really good at home, 3 meals/day. Weight stable. Denies any vomiting or diarrhea today, and states she's been eating 50% of meals. Requests Boost Plus as she drinks this at home, will order.   No further nutrition interventions warranted at this time. If nutrition issues arise, please consult RD.   Carlis Stable MS, [place], LDN [phone] Pager [phone] Weekend/After Hours Pager

## 2014-05-19 NOTE — Progress Notes (Signed)
[facility] PROGRESS NOTE  [name] [name] [id number] DOB: Dec 01, 1940 DOA: 05/17/2014 PCP: Gwendolyn Grant, MD  Assessment/Plan: 1. Hypertensive Emergency.  -Evidenced by SBP's in the 240's, elevated troponin, acute on chronic renal failure and patient complaints of chest pain  -Remaining hypertensive overnight for which she was administered PRN IV Lopressor.  -Blood pressures remained elevated yesterday for which a Cardene gtt was started. However this  lead to a rapid decrease in blood pressures for which IV drips needed to be held temporarily. Her blood pressures seemed better controlled over night, however SBP's are in the 200 range again this morning.     -Starting Clonidine 0.1 mg PO TID, continue Toprol XL 100 mg daily. Norvasc was stopped since IV Cardene was started. Patient is allergic to hydralazine.   2. NSTEMI -Troponin peaked at 4.6, trending down to 3.19 on AM labs work -She is CP free this morning.  -Cardiology did not recommend initiating anticoagulation given presence of uncontrolled hypertension -Will continue medical management with ASA, Statin, Beta Blocker, Nitrates. ACE discontinued due to Acute of chronic renal failure.  -2D Echo showing LVEF of 60-65%, without wall motion abnormalities. Grade 1 diastolic dysfunction.   3. Acute Anemia -Hg trending down to 6.6. She does not appear to have evidence of active bleeding. -She was typed and cross and will plan to transfuse 2 units of PRBC's -Repeat Hg 9.2  4. Acute on Chronic Renal Failure -Kidney injury may be evidence of end-organ damage from hypertensive emergency -ACE discontinued -Creatinine slowly coming down.   5. Possible Community Acquired Pneumonia -A CXR showed dense lower lobe airspace disease, started on emperic IV Azithromycin and Rocephin -Blood cultures and sputum cultures pending  6.  Diabetes Mellitus -Continue accuchecks q ac and q hs with sliding scale coverage   Code Status: Full  Code Family Communication: Spoke with family present at bedside Disposition Plan: Continue close monitoring in Step Down.    Consultants:  Cardiology   Antibiotics:  Azithromycin  Rocephin  HPI/Subjective: [name] [name] is a 73 y.o. female with a past medical history of medication nonadherence, Type II Diabetes Mellitus, Uncontrolled Hypertension, Chronic Hepatitis B, presenting to the emergency room at [facility] with complaints of Nausea/Vomiting/Diarrhea as well as chest pain. She was found to be hypertensive having a blood pressures in the 240 range. Labs showed the development of acute on chronic renal failure. She was admitted to the step unit, admitted for hypertensive emergency, started on IV Nitro.   Despite having elevated blood pressures, she reports feeling much better. She is sitting up having her breakfast.   Objective: Filed Vitals:   05/19/14 0715  BP: 228/86  Pulse: 69  Temp:   Resp: 22    Intake/Output Summary (Last 24 hours) at 05/19/14 0745 Last data filed at 05/19/14 0630  Gross per 24 hour  Intake   2040 ml  Output    425 ml  Net   1615 ml   Filed Weights   05/17/14 1750 05/18/14 0400 05/19/14 0500  Weight: 63.6 kg (140 lb 3.4 oz) 66.5 kg (146 lb 9.7 oz) 66.7 kg (147 lb 0.8 oz)    Exam:   General:  She is awake and alert, oriented to person and place  Cardiovascular: Regular rate and rhythm normal S1S2, 2/5 SEM  Respiratory: Normal inspiratory effort, has bibasilar crackles, positive rhonchi  Abdomen: Soft, nontender, nondistended  Musculoskeletal: No edema  Data Reviewed: Basic Metabolic Panel:  Recent Labs Lab 05/17/14 1435 05/18/14  0622 05/19/14 0354  NA 144 144 138  K 4.2 3.9 3.9  CL 107 109 103  CO2 24 24 21   GLUCOSE 137* 122* 85  BUN 47* 47* 48*  CREATININE 4.20* 4.06* 3.99*  CALCIUM 9.4 8.3* 8.2*   Liver Function Tests: No results found for this basename: AST, ALT, ALKPHOS, BILITOT, PROT, ALBUMIN,  in  the last 168 hours No results found for this basename: LIPASE, AMYLASE,  in the last 168 hours No results found for this basename: AMMONIA,  in the last 168 hours CBC:  Recent Labs Lab 05/17/14 1435 05/18/14 0622 05/19/14 0354  WBC 4.0 5.0 4.6  HGB 8.3* 6.6* 9.2*  HCT 25.2* 20.0* 27.0*  MCV 92.3 93.0 85.7  PLT 146* 129* 106*   Cardiac Enzymes:  Recent Labs Lab 05/17/14 1435 05/17/14 1815 05/18/14 0005 05/18/14 0622  TROPONINI 3.30* 3.16* 4.60* 3.19*   BNP (last 3 results)  Recent Labs  05/17/14 1956  PROBNP 20847.0*   CBG:  Recent Labs Lab 05/17/14 2121 05/18/14 0726 05/18/14 1144 05/18/14 1637 05/18/14 2114  GLUCAP 219* 113* 139* 113* 105*    Recent Results (from the past 240 hour(s))  CLOSTRIDIUM DIFFICILE BY PCR     Status: None   Collection Time    05/17/14  4:01 PM      Result Value Ref Range Status   C difficile by pcr NEGATIVE  NEGATIVE Final   Comment: Performed at [place], BLOOD (ROUTINE X 2)     Status: None   Collection Time    05/17/14  4:57 PM      Result Value Ref Range Status   Specimen Description BLOOD LEFT FOREARM  4 ML IN [facility] BOTTLE   Final   Special Requests Normal   Final   Culture  Setup Time     Final   Value: 05/17/2014 21:15     Performed at [facility]   Culture     Final   Value:        BLOOD CULTURE RECEIVED NO GROWTH TO DATE CULTURE WILL BE HELD FOR 5 DAYS BEFORE ISSUING A FINAL NEGATIVE REPORT     Performed at [facility]   Report Status PENDING   Incomplete  CULTURE, BLOOD (ROUTINE X 2)     Status: None   Collection Time    05/17/14  4:57 PM      Result Value Ref Range Status   Specimen Description BLOOD RIGHT FOREARM  4 ML IN [facility] BOTTLE   Final   Special Requests Normal   Final   Culture  Setup Time     Final   Value: 05/17/2014 21:15     Performed at [facility]   Culture     Final   Value:        BLOOD CULTURE RECEIVED NO GROWTH TO DATE CULTURE WILL BE HELD  FOR 5 DAYS BEFORE ISSUING A FINAL NEGATIVE REPORT     Performed at [facility]   Report Status PENDING   Incomplete  MRSA PCR SCREENING     Status: None   Collection Time    05/17/14  5:40 PM      Result Value Ref Range Status   MRSA by PCR NEGATIVE  NEGATIVE Final   Comment:            The GeneXpert MRSA Assay (FDA     approved for NASAL specimens     only), is one  component of a     comprehensive MRSA colonization     surveillance program. It is not     intended to diagnose MRSA     infection nor to guide or     monitor treatment for     MRSA infections.     Studies: Dg Chest 2 View  05/17/2014   CLINICAL DATA:  Shortness of breath. Abdominal pain with nausea and vomiting. Current history of hypertension and diabetes.  EXAM: CHEST  2 VIEW  COMPARISON:  02/09/2007, 09/12/2006.  FINDINGS: AP semi-erect and lateral images were obtained. Moderate cardiac enlargement, with significant interval increase in the heart size since 2008, even allowing for differences in technique. Pulmonary venous hypertension and likely minimal interstitial pulmonary edema. Airspace consolidation in the left lower lobe with silhouetting of the left hemidiaphragm. No confluent airspace consolidation elsewhere. Small bilateral pleural effusions. Degenerative changes involving the thoracic spine.  IMPRESSION: 1. Moderate cardiac enlargement with significant interval increase in heart size since 2008. 2. Pulmonary venous hypertension with perhaps minimal/incipient pulmonary edema. 3. Dense left lower lobe atelectasis versus pneumonia. Pneumonia is favored. 4. Small bilateral pleural effusions.   Electronically Signed   By: Evangeline Dakin M.D.   On: 05/17/2014 15:24   Dg Chest Port 1 View  05/18/2014   CLINICAL DATA:  Shortness of breath. Community acquired pneumonia. Chest pain. Myocardial infarct. Chronic kidney disease.  EXAM: CHEST - 1 VIEW  COMPARISON:  05/17/2014  FINDINGS: Persistent airspace opacity is  seen in the left retrocardiac lung base with silhouetting of the left hemidiaphragm. This shows no significant change since previous study. Right lung remains clear. Heart size is stable.  IMPRESSION: No significant change in retrocardiac left lower lobe opacity, suspicious for pneumonia.   Electronically Signed   By: Earle Gell M.D.   On: 05/18/2014 09:27    Scheduled Meds: . antiseptic oral rinse  15 mL Mouth Rinse BID  . aspirin EC  325 mg Oral Daily  . atorvastatin  40 mg Oral q1800  . azithromycin  500 mg Intravenous Q24H  . cefTRIAXone (ROCEPHIN)  IV  1 g Intravenous Q24H  . cloNIDine  0.1 mg Oral TID  . heparin  5,000 Units Subcutaneous 3 times per day  . insulin aspart  0-15 Units Subcutaneous TID WC  . insulin aspart  0-5 Units Subcutaneous QHS  . metoprolol succinate  100 mg Oral Daily  . sodium chloride  3 mL Intravenous Q12H  . timolol  1 drop Right Eye q morning - 10a   Continuous Infusions: . niCARDipine Stopped (05/19/14 0330)    Principal Problem:   Hypertensive emergency Active Problems:   Chest pain   CAP (community acquired pneumonia)   Elevated troponin   Chronic kidney disease, stage IV (severe)   NSTEMI (non-ST elevated myocardial infarction)    Time spent: 40 min    Kelvin Cellar  [facility] Pager [phone]. If 7PM-7AM, please contact night-coverage at [email or web address], password [facility] 05/19/2014, 7:45 AM  LOS: 2 days

## 2014-05-19 NOTE — Progress Notes (Signed)
Patient Name: [name] [name] Date of Encounter: 05/19/2014     Principal Problem:   Hypertensive emergency Active Problems:   Chronic kidney disease, stage IV (severe)   CAP (community acquired pneumonia)   Chest pain   Elevated troponin   NSTEMI (non-ST elevated myocardial infarction)    SUBJECTIVE  The patient feels well today.  No chest pain or shortness of breath. Her systolic blood pressure is still very elevated in the range of A999333 systolic.  She has a wide pulse pressure.  .  CURRENT MEDS . antiseptic oral rinse  15 mL Mouth Rinse BID  . aspirin EC  325 mg Oral Daily  . atorvastatin  40 mg Oral q1800  . azithromycin  500 mg Intravenous Q24H  . cefTRIAXone (ROCEPHIN)  IV  1 g Intravenous Q24H  . cloNIDine  0.1 mg Oral TID  . heparin  5,000 Units Subcutaneous 3 times per day  . insulin aspart  0-15 Units Subcutaneous TID WC  . insulin aspart  0-5 Units Subcutaneous QHS  . metoprolol succinate  100 mg Oral Daily  . sodium chloride  500 mL Intravenous Once  . sodium chloride  3 mL Intravenous Q12H  . timolol  1 drop Right Eye q morning - 10a    OBJECTIVE  Filed Vitals:   05/19/14 0645 05/19/14 0715 05/19/14 0740 05/19/14 0800  BP: 226/106 228/86 250/86   Pulse: 70 69 71   Temp:    98.4 F (36.9 C)  TempSrc:    Oral  Resp: 18 22 19    Height:      Weight:      SpO2: 100% 99% 100%     Intake/Output Summary (Last 24 hours) at 05/19/14 0824 Last data filed at 05/19/14 0630  Gross per 24 hour  Intake   1980 ml  Output    425 ml  Net   1555 ml   Filed Weights   05/17/14 1750 05/18/14 0400 05/19/14 0500  Weight: 140 lb 3.4 oz (63.6 kg) 146 lb 9.7 oz (66.5 kg) 147 lb 0.8 oz (66.7 kg)    PHYSICAL EXAM  General: Pleasant, NAD. Neuro: Alert and oriented X 3. Moves all extremities spontaneously. Psych: Normal affect. HEENT:  Normal  Neck: Supple without bruits or JVD. Lungs:  Rales at bases. Heart: RRR no s3, s4, or murmurs. Abdomen: Soft, non-tender,  non-distended, BS + x 4.  Extremities: No clubbing, cyanosis or edema. DP/PT/Radials 2+ and equal bilaterally.  Accessory Clinical Findings  CBC  Recent Labs  05/18/14 0622 05/19/14 0354  WBC 5.0 4.6  HGB 6.6* 9.2*  HCT 20.0* 27.0*  MCV 93.0 85.7  PLT 129* [id number]*   Basic Metabolic Panel  Recent Labs  05/18/14 0622 05/19/14 0354  NA 144 138  K 3.9 3.9  CL 109 103  CO2 24 21  GLUCOSE 122* 85  BUN 47* 48*  CREATININE 4.06* 3.99*  CALCIUM 8.3* 8.2*   Liver Function Tests No results found for this basename: AST, ALT, ALKPHOS, BILITOT, PROT, ALBUMIN,  in the last 72 hours No results found for this basename: LIPASE, AMYLASE,  in the last 72 hours Cardiac Enzymes  Recent Labs  05/17/14 1815 05/18/14 0005 05/18/14 0622  TROPONINI 3.16* 4.60* 3.19*   BNP No components found with this basename: POCBNP,  D-Dimer No results found for this basename: DDIMER,  in the last 72 hours Hemoglobin A1C  Recent Labs  05/17/14 1815  HGBA1C 5.7*   Fasting Lipid Panel  Recent  Labs  05/18/14 0622  CHOL 239*  HDL 66  LDLCALC 143*  TRIG 149  CHOLHDL 3.6   Thyroid Function Tests  Recent Labs  05/17/14 1430  TSH 3.140    TELE  Normal sinus rhythm  ECG  2-D echo: - Left ventricle: The cavity size was normal. Wall thickness was increased in a pattern of mild LVH. There was focal basal hypertrophy. Systolic function was normal. The estimated ejection fraction was in the range of 60% to 65%. Wall motion was normal; there were no regional wall motion abnormalities. There was an increased relative contribution of atrial contraction to ventricular filling. Doppler parameters are consistent with abnormal left ventricular relaxation (grade 1 diastolic dysfunction). - Left atrium: The atrium was mildly dilated.  Radiology/Studies  Dg Chest 2 View  05/17/2014   CLINICAL DATA:  Shortness of breath. Abdominal pain with nausea and vomiting. Current history of  hypertension and diabetes.  EXAM: CHEST  2 VIEW  COMPARISON:  02/09/2007, 09/12/2006.  FINDINGS: AP semi-erect and lateral images were obtained. Moderate cardiac enlargement, with significant interval increase in the heart size since 2008, even allowing for differences in technique. Pulmonary venous hypertension and likely minimal interstitial pulmonary edema. Airspace consolidation in the left lower lobe with silhouetting of the left hemidiaphragm. No confluent airspace consolidation elsewhere. Small bilateral pleural effusions. Degenerative changes involving the thoracic spine.  IMPRESSION: 1. Moderate cardiac enlargement with significant interval increase in heart size since 2008. 2. Pulmonary venous hypertension with perhaps minimal/incipient pulmonary edema. 3. Dense left lower lobe atelectasis versus pneumonia. Pneumonia is favored. 4. Small bilateral pleural effusions.   Electronically Signed   By: Evangeline Dakin M.D.   On: 05/17/2014 15:24   Dg Chest Port 1 View  05/18/2014   CLINICAL DATA:  Shortness of breath. Community acquired pneumonia. Chest pain. Myocardial infarct. Chronic kidney disease.  EXAM: CHEST - 1 VIEW  COMPARISON:  05/17/2014  FINDINGS: Persistent airspace opacity is seen in the left retrocardiac lung base with silhouetting of the left hemidiaphragm. This shows no significant change since previous study. Right lung remains clear. Heart size is stable.  IMPRESSION: No significant change in retrocardiac left lower lobe opacity, suspicious for pneumonia.   Electronically Signed   By: Earle Gell M.D.   On: 05/18/2014 09:27    ASSESSMENT AND PLAN 73 yo female hx of HTN, Hep B, HL, DM, and CKD admitted with cough, SOB, chest pain.  1. Hypertensive emergency  - initial SBPs in the 240s (MAP 163), evidence of end organ damage with NSTEMI and AKI on CKD  - goal MAP approx 120, goal MAP decrease in HTN emergency in 25% over 24 hrs.  Nicardipine being restarted this morning. 2. NSTEMI    - peak trop 4.6 and trending down, EKG without specific ischemic changes. Occurred in setting of severe HTN and possible pneumonia as well  - she is on ASA, beta blocker, statin. No ACE due to renal dysfunction. Anticoag held in setting of severe HTN. Now with progression of anemia, continue to hold  Echocardiogram shows normal left ventricular function and no wall motion abnormalities - pending echo results and response to current medical therapy, decide on non-invasive vs invasive ischemic testing.  I would lean toward noninvasive ischemic testing once her blood pressure is under better control. High risk for progression of her baseline renal dysfunction with cath. Her anemia would also be a concern.    Signed, Darlin Coco MD

## 2014-05-20 DIAGNOSIS — N289 Disorder of kidney and ureter, unspecified: Secondary | ICD-10-CM

## 2014-05-20 LAB — CBC
HEMATOCRIT: 26 % — AB (ref 36.0–46.0)
Hemoglobin: 9.1 g/dL — ABNORMAL LOW (ref 12.0–15.0)
MCH: 30.8 pg (ref 26.0–34.0)
MCHC: 35 g/dL (ref 30.0–36.0)
MCV: 88.1 fL (ref 78.0–100.0)
Platelets: 90 10*3/uL — ABNORMAL LOW (ref 150–400)
RBC: 2.95 MIL/uL — ABNORMAL LOW (ref 3.87–5.11)
RDW: 16.1 % — AB (ref 11.5–15.5)
WBC: 4 10*3/uL (ref 4.0–10.5)

## 2014-05-20 LAB — APTT: APTT: 47 s — AB (ref 24–37)

## 2014-05-20 LAB — BASIC METABOLIC PANEL
ANION GAP: 13 (ref 5–15)
BUN: 53 mg/dL — AB (ref 6–23)
CO2: 20 mEq/L (ref 19–32)
Calcium: 7.6 mg/dL — ABNORMAL LOW (ref 8.4–10.5)
Chloride: 105 mEq/L (ref 96–112)
Creatinine, Ser: 4.06 mg/dL — ABNORMAL HIGH (ref 0.50–1.10)
GFR calc Af Amer: 12 mL/min — ABNORMAL LOW (ref >90)
GFR calc non Af Amer: 10 mL/min — ABNORMAL LOW (ref >90)
Glucose, Bld: 121 mg/dL — ABNORMAL HIGH (ref 70–99)
Potassium: 3.8 mEq/L (ref 3.7–5.3)
Sodium: 138 mEq/L (ref 137–147)

## 2014-05-20 LAB — PROTIME-INR
INR: 1.09 (ref 0.00–1.49)
Prothrombin Time: 14.1 seconds (ref 11.6–15.2)

## 2014-05-20 LAB — GLUCOSE, CAPILLARY
GLUCOSE-CAPILLARY: 115 mg/dL — AB (ref 70–99)
GLUCOSE-CAPILLARY: 171 mg/dL — AB (ref 70–99)
Glucose-Capillary: 135 mg/dL — ABNORMAL HIGH (ref 70–99)
Glucose-Capillary: 105 mg/dL — ABNORMAL HIGH (ref 70–99)
Glucose-Capillary: 126 mg/dL — ABNORMAL HIGH (ref 70–99)

## 2014-05-20 MED ORDER — ISOSORBIDE MONONITRATE ER 60 MG PO TB24
60.0000 mg | ORAL_TABLET | Freq: Every day | ORAL | Status: DC
  Administered 2014-05-20 – 2014-05-26 (×7): 60 mg via ORAL
  Filled 2014-05-20 (×8): qty 1

## 2014-05-20 NOTE — Progress Notes (Signed)
Patient ID: AJADA GHALI, female   DOB: July 09, 1941, 73 y.o.   MRN: [id number]   Patient Name: [name] [name] Date of Encounter: 05/20/2014     Principal Problem:   Hypertensive emergency Active Problems:   Type II or unspecified type diabetes mellitus with renal manifestations, uncontrolled   Malignant hypertension   Elevated troponin   NSTEMI (non-ST elevated myocardial infarction)   HYPERLIPIDEMIA   Chronic kidney disease, stage IV (severe)   CAP (community acquired pneumonia)   Chest pain    SUBJECTIVE  The patient feels well today.  No chest pain or shortness of breath. Her systolic blood pressure is still very elevated in the range of [id number] systolic.  She has a wide pulse pressure.  .  CURRENT MEDS . antiseptic oral rinse  15 mL Mouth Rinse BID  . aspirin EC  325 mg Oral Daily  . atorvastatin  40 mg Oral q1800  . azithromycin  500 mg Intravenous Q24H  . cefTRIAXone (ROCEPHIN)  IV  1 g Intravenous Q24H  . cloNIDine  0.3 mg Oral TID  . heparin  5,000 Units Subcutaneous 3 times per day  . insulin aspart  0-15 Units Subcutaneous TID WC  . insulin aspart  0-5 Units Subcutaneous QHS  . isosorbide mononitrate  60 mg Oral Daily  . lactose free nutrition  237 mL Oral BID BM  . metoprolol succinate  100 mg Oral Daily  . sodium chloride  3 mL Intravenous Q12H  . timolol  1 drop Right Eye q morning - 10a    OBJECTIVE  Filed Vitals:   05/20/14 0600 05/20/14 0630 05/20/14 0700 05/20/14 0800  BP: 201/62 197/60 219/61   Pulse: 56 57 60   Temp:    98.5 F (36.9 C)  TempSrc:    Oral  Resp: 17 16 15    Height:      Weight:      SpO2: 100% 100% 100%     Intake/Output Summary (Last 24 hours) at 05/20/14 0842 Last data filed at 05/20/14 0807  Gross per 24 hour  Intake 1001.66 ml  Output    500 ml  Net 501.66 ml   Filed Weights   05/18/14 0400 05/19/14 0500 05/20/14 0400  Weight: 146 lb 9.7 oz (66.5 kg) 147 lb 0.8 oz (66.7 kg) 154 lb 5.2 oz (70 kg)    PHYSICAL  EXAM  General: Pleasant, NAD. Neuro: Alert and oriented X 3. Moves all extremities spontaneously. Psych: Normal affect. HEENT:  Normal  Neck: Supple without bruits or JVD. Lungs:  Rales at bases. Heart: RRR no s3, s4, or murmurs. Abdomen: Soft, non-tender, non-distended, BS + x 4.  Extremities: No clubbing, cyanosis or edema. DP/PT/Radials 2+ and equal bilaterally.  Accessory Clinical Findings  CBC  Recent Labs  05/19/14 0354 05/20/14 0355  WBC 4.6 4.0  HGB 9.2* 9.1*  HCT 27.0* 26.0*  MCV 85.7 88.1  PLT 106* 90*   Basic Metabolic Panel  Recent Labs  05/19/14 0354 05/20/14 0355  NA 138 138  K 3.9 3.8  CL 103 105  CO2 21 20  GLUCOSE 85 121*  BUN 48* 53*  CREATININE 3.99* 4.06*  CALCIUM 8.2* 7.6*   Liver Function Tests No results found for this basename: AST, ALT, ALKPHOS, BILITOT, PROT, ALBUMIN,  in the last 72 hours No results found for this basename: LIPASE, AMYLASE,  in the last 72 hours Cardiac Enzymes  Recent Labs  05/17/14 1815 05/18/14 0005 05/18/14 0622  TROPONINI 3.16* 4.60*  3.19*   BNP No components found with this basename: POCBNP,  D-Dimer No results found for this basename: DDIMER,  in the last 72 hours Hemoglobin A1C  Recent Labs  05/17/14 1815  HGBA1C 5.7*   Fasting Lipid Panel  Recent Labs  05/18/14 0622  CHOL 239*  HDL 66  LDLCALC 143*  TRIG 149  CHOLHDL 3.6   Thyroid Function Tests  Recent Labs  05/17/14 1430  TSH 3.140    TELE  Normal sinus rhythm  ECG  2-D echo: - Left ventricle: The cavity size was normal. Wall thickness was increased in a pattern of mild LVH. There was focal basal hypertrophy. Systolic function was normal. The estimated ejection fraction was in the range of 60% to 65%. Wall motion was normal; there were no regional wall motion abnormalities. There was an increased relative contribution of atrial contraction to ventricular filling. Doppler parameters are consistent with abnormal left  ventricular relaxation (grade 1 diastolic dysfunction). - Left atrium: The atrium was mildly dilated.  Radiology/Studies- reviewed.  ASSESSMENT AND PLAN 73 yo female hx of HTN, Hep B, HL, DM, and CKD admitted with cough, SOB, chest pain.  1. Hypertensive emergency - initial SBPs in the 240s (MAP 163), evidence of end organ damage with NSTEMI and AKI on CKD  - goal MAP approx 120, goal MAP decrease in HTN emergency in 25% over 24 hrs.  Nicardipine restarted this morning. (has had rapid drops in BP after initial Rx - aiming for more gradual control)  Would initiate evaluation for secondary HTN including Renal Artery Dopplers 2. NSTEMI - peak trop 4.6 and trending down, EKG without specific ischemic changes. Occurred in setting of severe HTN and possible pneumonia as well  - she is on ASA, beta blocker, statin. No ACE due to renal dysfunction. Anticoag held in setting of severe HTN. Now with progression of anemia, continue to hold  Echocardiogram shows normal left ventricular function and no wall motion abnormalities --> would plan on Non-invasive ischemic evaluation as Cath would not be prudent @ this time with her poor renal function.  Signed, Leonie Man, M.D., M.S. Interventional Cardiologist   Pager # [phone] 05/20/2014

## 2014-05-20 NOTE — Progress Notes (Addendum)
Cardene drip was stopped and then later restarted when B/P began to elevate.   No further episodes of epistaxis noted after Afrin nasal spray was administered. Nasal canula was changed to humidified to prevent further drying out of nasal membranes.

## 2014-05-20 NOTE — Progress Notes (Signed)
[facility] PROGRESS NOTE  [name] [name] [id number] DOB: [birth year] DOA: 05/17/2014 PCP: Gwendolyn Grant, MD  Interim Summary Patient is a pleasant 73 year old female with a past medical history of uncontrolled hypertension, stage IV chronic kidney disease, type 2 diabetes mellitus, history medication nonadherence, who was admitted to the medicine service on 05/17/2014. She presented initially with complaints of nausea, vomiting, diarrhea associated with precordial chest pain. On presentation she was found to be hypertensive with systolic blood pressures in the 240s range. She was also found to have an elevated troponin of 3.3 as EKG did not reveal acute ST segment changes. Hypertensive emergency evidenced by presence of systolic blood pressures in the 240s, elevated troponin, acute on chronic renal failure likely due to medication nonadherence. She was started initially on a nitroglycerin drip, admitted to the step down unit. Cardiology was consulted. With regard to elevated troponin it was felt this likely was related to demand ischemia. Cardiology favoring noninvasive work up given underlying kidney disease. She was not administered anticoagulation due to presence of anemia as her hemoglobin dropped to 6.6, and required blood transfusion with 2 units of PRBC's. She did not have evidence of active GI bleed. I suspect acute on chronic anemia secondary to CKD.   With regard to blood pressure management, despite the administration of IV nitroglycerin she remained hypertensive for which she was started on a Cardene gtt. This intervention seemed to control her blood pressure better. Meanwhile she was started on clonidine 0.3 mg by mouth 3 times a day, Imdur were 60 mg by mouth daily and continued on her Toprol-XL 100 mg daily. Other issues addressed during this hospitalization include Community Acquired Pneumonia as an initial chest x-ray showed dense left lower lobe consolidation. She was treated  with IV ceftriaxone and azithromycin. Patient remains hypertensive and on IV Cardene, as she will be kept in the intensive care unit until blood pressure stabilized. Cardiology following.  Assessment/Plan: 1. Hypertensive Emergency.  -Evidenced by SBP's in the 240's, elevated troponin, acute on chronic renal failure and patient complaints of chest pain  -Blood pressures remained elevated yesterday for which a Cardene gtt was started. However this  lead to a rapid decrease in blood pressures for which IV drips needed to be held temporarily. Her blood pressures remain elevated as her CArdene gtt is being titrated.   -I increased Clonidine to 0.3 mg PO TID, added Imdur 60 mg PO q daily. Continue Toprol XL at 100 mg daily. Bradycardia may limit increasing beta blocker therapy. Norvasc was stopped since IV Cardene was started. Patient is allergic to hydralazine.   2. NSTEMI -Troponin peaked at 4.6, trending down to 3.19 on AM labs work -She is CP free this morning.  -Cardiology did not recommend initiating anticoagulation given presence of uncontrolled hypertension and acute on chronic anemia -Will continue medical management with ASA, Statin, Beta Blocker, Nitrates. ACE discontinued due to Acute of chronic renal failure.  -2D Echo showing LVEF of 60-65%, without wall motion abnormalities. Grade 1 diastolic dysfunction.  -Will likely undergo noninvasive workup given renal impairment.   3. Acute on Chronic Anemia -Hg trending down to 6.6. She does not appear to have evidence of active bleeding. -She was typed and cross and transfused 2 units of PRBC's -Repeat Hg 9.2  4. Acute on Chronic Renal Failure -Kidney  injury may be evidence of end-organ damage from hypertensive emergency -ACE discontinued -Blood pressures remaining elevated, titrating meds today.   5. Possible Community Acquired Pneumonia -A CXR showed dense lower lobe airspace disease, started on emperic IV Azithromycin and Rocephin -Repeat CXR on 05/18/2014 did not show significant change to left lower lobe infiltrate.  -Blood cultures showing no growth, continue emperic antibiotic converge.   6.  Diabetes Mellitus -Continue accuchecks q ac and q hs with sliding scale coverage  -Blood sugras controlled   Code Status: Full Code Family Communication: Spoke with family present at bedside Disposition Plan: Continue close monitoring in Step Down.    Consultants:  Cardiology   Antibiotics:  Azithromycin  Rocephin  HPI/Subjective: APRYL LAZARIN is a 73 y.o. female with a past medical history of medication nonadherence, Type II Diabetes Mellitus, Uncontrolled Hypertension, Chronic Hepatitis B, presenting to the emergency room at [facility] with complaints of Nausea/Vomiting/Diarrhea as well as chest pain. She was found to be hypertensive having a blood pressures in the 240 range. Labs showed the development of acute on chronic renal failure. She was admitted to the step unit, admitted for hypertensive emergency, started on IV Nitro.   Patient having no complaints, she is sitting up having her breakfast, in no acute distress.   Objective: Filed Vitals:   05/20/14 0700  BP: 219/61  Pulse: 60  Temp:   Resp: 15    Intake/Output Summary (Last 24 hours) at 05/20/14 0802 Last data filed at 05/20/14 0600  Gross per 24 hour  Intake 1001.66 ml  Output    300 ml  Net 701.66 ml   Filed Weights   05/18/14 0400 05/19/14 0500 05/20/14 0400  Weight: 66.5 kg (146 lb 9.7 oz) 66.7 kg (147 lb 0.8 oz) 70 kg (154 lb 5.2 oz)    Exam:   General:  She is awake and alert, oriented to person and place  Cardiovascular:  Regular rate and rhythm normal S1S2, 2/5 SEM  Respiratory: Normal inspiratory effort, has bibasilar crackles, positive rhonchi  Abdomen: Soft, nontender, nondistended  Musculoskeletal: No edema  Data Reviewed: Basic Metabolic Panel:  Recent Labs Lab 05/17/14 1435 05/18/14 0622 05/19/14 0354 05/20/14 0355  NA 144 144 138 138  K 4.2 3.9 3.9 3.8  CL 107 109 103 105  CO2 24 24 21 20   GLUCOSE 137* 122* 85 121*  BUN 47* 47* 48* 53*  CREATININE 4.20* 4.06* 3.99* 4.06*  CALCIUM 9.4 8.3* 8.2* 7.6*   Liver Function Tests: No results found for this basename: AST, ALT, ALKPHOS, BILITOT, PROT, ALBUMIN,  in the last 168 hours No results found for this basename: LIPASE, AMYLASE,  in the last 168 hours No results found for this basename: AMMONIA,  in the last 168 hours CBC:  Recent Labs Lab 05/17/14 1435 05/18/14 0622 05/19/14 0354 05/20/14 0355  WBC 4.0 5.0 4.6 4.0  HGB 8.3* 6.6* 9.2* 9.1*  HCT 25.2* 20.0* 27.0* 26.0*  MCV 92.3 93.0 85.7 88.1  PLT 146* 129* 106* 90*   Cardiac Enzymes:  Recent Labs Lab 05/17/14 1435 05/17/14 1815 05/18/14 0005 05/18/14 0622  TROPONINI 3.30* 3.16* 4.60* 3.19*   BNP (last 3 results)  Recent Labs  05/17/14 1956  PROBNP 20847.0*   CBG:  Recent Labs Lab 05/18/14 2114 05/19/14 0757 05/19/14 1222 05/19/14 1653 05/19/14 2122  GLUCAP 105* 72 107* 118* 135*    Recent Results (from the past 240 hour(s))  CLOSTRIDIUM DIFFICILE BY PCR     Status: None   Collection Time    05/17/14  4:01 PM      Result Value Ref Range Status   C difficile by pcr NEGATIVE  NEGATIVE Final   Comment: Performed at [place], BLOOD (ROUTINE X 2)     Status: None   Collection Time    05/17/14  4:57 PM      Result Value Ref Range Status   Specimen Description BLOOD LEFT FOREARM  4 ML IN [facility] BOTTLE   Final   Special Requests Normal   Final   Culture  Setup Time     Final   Value: 05/17/2014 21:15     Performed at [facility]   Culture     Final   Value:        BLOOD CULTURE RECEIVED NO GROWTH TO DATE CULTURE WILL BE HELD FOR 5 DAYS BEFORE ISSUING A FINAL NEGATIVE REPORT     Performed at [facility]   Report Status PENDING   Incomplete  CULTURE, BLOOD (ROUTINE X 2)     Status: None   Collection Time    05/17/14  4:57 PM      Result Value Ref Range Status   Specimen Description BLOOD RIGHT FOREARM  4 ML IN [facility] BOTTLE   Final   Special Requests Normal   Final   Culture  Setup Time     Final   Value: 05/17/2014 21:15     Performed at [facility]   Culture     Final   Value:        BLOOD CULTURE RECEIVED NO GROWTH TO DATE CULTURE WILL BE HELD FOR 5 DAYS BEFORE ISSUING A FINAL NEGATIVE REPORT     Performed at [facility]   Report Status PENDING   Incomplete  MRSA PCR SCREENING     Status: None   Collection Time    05/17/14  5:40 PM      Result Value Ref Range Status   MRSA by PCR NEGATIVE  NEGATIVE Final   Comment:  The GeneXpert MRSA Assay (FDA     approved for NASAL specimens     only), is one component of a     comprehensive MRSA colonization     surveillance program. It is not     intended to diagnose MRSA     infection nor to guide or     monitor treatment for     MRSA infections.     Studies: Dg Chest Port 1 View  05/18/2014   CLINICAL DATA:  Shortness of breath. Community acquired pneumonia. Chest pain. Myocardial infarct. Chronic kidney disease.  EXAM: CHEST - 1 VIEW  COMPARISON:  05/17/2014  FINDINGS: Persistent airspace opacity is seen in the left retrocardiac lung base with silhouetting of the left hemidiaphragm. This shows no significant change since previous study. Right lung remains clear. Heart size is stable.  IMPRESSION: No significant change in retrocardiac left lower lobe opacity, suspicious for pneumonia.   Electronically Signed   By: Earle Gell M.D.   On: 05/18/2014 09:27    Scheduled Meds: . antiseptic oral rinse  15 mL Mouth Rinse  BID  . aspirin EC  325 mg Oral Daily  . atorvastatin  40 mg Oral q1800  . azithromycin  500 mg Intravenous Q24H  . cefTRIAXone (ROCEPHIN)  IV  1 g Intravenous Q24H  . cloNIDine  0.3 mg Oral TID  . heparin  5,000 Units Subcutaneous 3 times per day  . insulin aspart  0-15 Units Subcutaneous TID WC  . insulin aspart  0-5 Units Subcutaneous QHS  . isosorbide mononitrate  60 mg Oral Daily  . lactose free nutrition  237 mL Oral BID BM  . metoprolol succinate  100 mg Oral Daily  . sodium chloride  3 mL Intravenous Q12H  . timolol  1 drop Right Eye q morning - 10a   Continuous Infusions: . niCARDipine 2.5 mg/hr (05/20/14 0423)    Principal Problem:   Hypertensive emergency Active Problems:   Malignant hypertension   Chest pain   CAP (community acquired pneumonia)   Elevated troponin   Type II or unspecified type diabetes mellitus with renal manifestations, uncontrolled   HYPERLIPIDEMIA   Chronic kidney disease, stage IV (severe)   NSTEMI (non-ST elevated myocardial infarction)    Time spent: 40 min    Kelvin Cellar  [facility] Pager [phone]. If 7PM-7AM, please contact night-coverage at [email or web address], password [facility] 05/20/2014, 8:02 AM  LOS: 3 days

## 2014-05-20 NOTE — Progress Notes (Signed)
Came to visit patient at bedside to offer and explain [facility] Care Management services. Discussed medication management. She endorses that she doel not take some of her meds sometimes because they make her dizzy. States she is able to afford her medications. She lives with her husband who takes her to her MD appointments. Discussed importance of MD follow up post hospital discharge. Consents obtained and [facility] Care Management packet left at bedside. Explained that she will receive post hospital discharge calls and will be evaluated for monthly home visits. Discussed how [facility] services will not interfere or replace home health if she should require home health services at discharge. Will make inpatient RNCM aware of visit. Patient expresses appreciation of visit. Marthenia Rolling, MSN-RN,BSN- [facility] [phone]

## 2014-05-21 LAB — CBC
HEMATOCRIT: 28.1 % — AB (ref 36.0–46.0)
Hemoglobin: 9.7 g/dL — ABNORMAL LOW (ref 12.0–15.0)
MCH: 29.6 pg (ref 26.0–34.0)
MCHC: 34.5 g/dL (ref 30.0–36.0)
MCV: 85.7 fL (ref 78.0–100.0)
PLATELETS: 102 10*3/uL — AB (ref 150–400)
RBC: 3.28 MIL/uL — ABNORMAL LOW (ref 3.87–5.11)
RDW: 15.2 % (ref 11.5–15.5)
WBC: 3.4 10*3/uL — ABNORMAL LOW (ref 4.0–10.5)

## 2014-05-21 LAB — BASIC METABOLIC PANEL
Anion gap: 14 (ref 5–15)
BUN: 58 mg/dL — AB (ref 6–23)
CALCIUM: 8.1 mg/dL — AB (ref 8.4–10.5)
CO2: 21 mEq/L (ref 19–32)
CREATININE: 4.04 mg/dL — AB (ref 0.50–1.10)
Chloride: 105 mEq/L (ref 96–112)
GFR calc Af Amer: 12 mL/min — ABNORMAL LOW (ref >90)
GFR calc non Af Amer: 10 mL/min — ABNORMAL LOW (ref >90)
GLUCOSE: 121 mg/dL — AB (ref 70–99)
Potassium: 3.7 mEq/L (ref 3.7–5.3)
Sodium: 140 mEq/L (ref 137–147)

## 2014-05-21 LAB — GLUCOSE, CAPILLARY
GLUCOSE-CAPILLARY: 150 mg/dL — AB (ref 70–99)
Glucose-Capillary: 155 mg/dL — ABNORMAL HIGH (ref 70–99)
Glucose-Capillary: 101 mg/dL — ABNORMAL HIGH (ref 70–99)
Glucose-Capillary: 124 mg/dL — ABNORMAL HIGH (ref 70–99)

## 2014-05-21 MED ORDER — AMLODIPINE BESYLATE 10 MG PO TABS: 10.0000 mg | ORAL_TABLET | Freq: Every day | ORAL | Status: DC

## 2014-05-21 MED ORDER — HYDRALAZINE HCL 25 MG PO TABS
25.0000 mg | ORAL_TABLET | Freq: Three times a day (TID) | ORAL | Status: DC
  Administered 2014-05-21: 25 mg via ORAL
  Filled 2014-05-21: qty 1

## 2014-05-21 MED ORDER — AMLODIPINE BESYLATE 10 MG PO TABS
10.0000 mg | ORAL_TABLET | Freq: Every day | ORAL | Status: DC
  Administered 2014-05-22 – 2014-05-31 (×8): 10 mg via ORAL
  Filled 2014-05-21 (×10): qty 1

## 2014-05-21 MED ORDER — AMLODIPINE BESYLATE 5 MG PO TABS
5.0000 mg | ORAL_TABLET | Freq: Every day | ORAL | Status: DC
  Administered 2014-05-21: 5 mg via ORAL
  Filled 2014-05-21: qty 1

## 2014-05-21 MED ORDER — HYDRALAZINE HCL 20 MG/ML IJ SOLN
10.0000 mg | Freq: Four times a day (QID) | INTRAMUSCULAR | Status: DC | PRN
  Administered 2014-05-21 – 2014-05-22 (×2): 10 mg via INTRAVENOUS
  Filled 2014-05-21 (×2): qty 1

## 2014-05-21 MED ORDER — AZITHROMYCIN 500 MG PO TABS
500.0000 mg | ORAL_TABLET | Freq: Every day | ORAL | Status: DC
  Administered 2014-05-21 – 2014-05-24 (×4): 500 mg via ORAL
  Filled 2014-05-21 (×6): qty 2

## 2014-05-21 MED ORDER — DIPHENHYDRAMINE HCL 25 MG PO CAPS: 25.0000 mg | ORAL_CAPSULE | ORAL | Status: DC | PRN

## 2014-05-21 MED ORDER — GLUCERNA SHAKE PO LIQD
237.0000 mL | Freq: Three times a day (TID) | ORAL | Status: DC
  Administered 2014-05-21 – 2014-05-26 (×14): 237 mL via ORAL
  Filled 2014-05-21 (×15): qty 237

## 2014-05-21 MED ORDER — AMLODIPINE BESYLATE 5 MG PO TABS
5.0000 mg | ORAL_TABLET | Freq: Once | ORAL | Status: AC
  Administered 2014-05-21: 5 mg via ORAL
  Filled 2014-05-21: qty 1

## 2014-05-21 MED ORDER — FUROSEMIDE 10 MG/ML IJ SOLN
40.0000 mg | Freq: Once | INTRAMUSCULAR | Status: AC
  Administered 2014-05-21: 40 mg via INTRAVENOUS
  Filled 2014-05-21: qty 4

## 2014-05-21 NOTE — Progress Notes (Signed)
[facility] PROGRESS NOTE  [name] [name] [id number] DOB: 1941/03/07 DOA: 05/17/2014 PCP: Gwendolyn Grant, MD  Assessment/Plan: 1. Hypertensive emergency - Will increase amlodipine dose - Add hydralazine injection 10 mg IV PRN SBP > 180 or DBP > 110 - continue to monitor closely in stepdown unit - Patient is currently on amlodipine clonidine hydralazine tablets and Imdur.  - A. she reports that she has had difficulty with compliance secondary to proceed side effects of nightmares  2. Chest pain/and NSTEMI - Cardiology on board and managing - Patient is currently on aspirin, beta blocker, statin. No ACE inhibitor due to renal dysfunction. Anticoagulation held in setting of severe hypertension.  3. Acute on chronic kidney failure - Most likely related to endorgan damage from recent hypertensive emergency. - Will continue to monitor serum creatinine and adjust blood pressure medications  4. diabetes mellitus - Continue sliding scale insulin  5. CAP - Currently on azithromycin and Rocephin. - Left lower lobe opacity reported on chest x-ray  Code Status: full Family Communication: No family at bedside Disposition Plan: Pending improvement in condition, for now stepdown   Consultants:  Cardiology   Antibiotics:  As listed above  HPI/Subjective: No new complaints. Pt reports of some of her medicine gave her nightmares. She is unsure which ones as such she discontinued using them at home. She currently denies any chest pain, headaches, or abdominal discomfort.  Objective: Filed Vitals:   05/21/14 1500  BP: 221/71  Pulse: 60  Temp:   Resp: 32    Intake/Output Summary (Last 24 hours) at 05/21/14 1542 Last data filed at 05/21/14 1500  Gross per 24 hour  Intake   1003 ml  Output   1575 ml  Net   -572 ml   Filed Weights   05/19/14 0500 05/20/14 0400 05/21/14 0514  Weight: 66.7 kg (147 lb 0.8 oz) 70 kg (154 lb 5.2 oz) 71.7 kg (158 lb 1.1 oz)     Exam:   General:  Patient in no acute distress, alert and awake  Cardiovascular: Regular rate and rhythm, no murmurs  Respiratory: No increased work of breathing, and no wheezes  Abdomen: Soft, nondistended, nontender  Musculoskeletal: No cyanosis or clubbing   Data Reviewed: Basic Metabolic Panel:  Recent Labs Lab 05/17/14 1435 05/18/14 0622 05/19/14 0354 05/20/14 0355 05/21/14 0333  NA 144 144 138 138 140  K 4.2 3.9 3.9 3.8 3.7  CL 107 109 103 105 105  CO2 24 24 21 20 21   GLUCOSE 137* 122* 85 121* 121*  BUN 47* 47* 48* 53* 58*  CREATININE 4.20* 4.06* 3.99* 4.06* 4.04*  CALCIUM 9.4 8.3* 8.2* 7.6* 8.1*   Liver Function Tests: No results found for this basename: AST, ALT, ALKPHOS, BILITOT, PROT, ALBUMIN,  in the last 168 hours No results found for this basename: LIPASE, AMYLASE,  in the last 168 hours No results found for this basename: AMMONIA,  in the last 168 hours CBC:  Recent Labs Lab 05/17/14 1435 05/18/14 0622 05/19/14 0354 05/20/14 0355 05/21/14 0333  WBC 4.0 5.0 4.6 4.0 3.4*  HGB 8.3* 6.6* 9.2* 9.1* 9.7*  HCT 25.2* 20.0* 27.0* 26.0* 28.1*  MCV 92.3 93.0 85.7 88.1 85.7  PLT 146* 129* 106* 90* 102*   Cardiac Enzymes:  Recent Labs Lab 05/17/14 1435 05/17/14 1815 05/18/14 0005 05/18/14 0622  TROPONINI 3.30* 3.16* 4.60* 3.19*   BNP (last 3 results)  Recent Labs  05/17/14 1956  PROBNP 20847.0*   CBG:  Recent Labs Lab 05/20/14  1241 05/20/14 1612 05/20/14 2110 05/21/14 0809 05/21/14 1148  GLUCAP 115* 171* 126* 155* 101*    Recent Results (from the past 240 hour(s))  CLOSTRIDIUM DIFFICILE BY PCR     Status: None   Collection Time    05/17/14  4:01 PM      Result Value Ref Range Status   C difficile by pcr NEGATIVE  NEGATIVE Final   Comment: Performed at [place], BLOOD (ROUTINE X 2)     Status: None   Collection Time    05/17/14  4:57 PM      Result Value Ref Range Status   Specimen Description BLOOD  LEFT FOREARM  4 ML IN [facility] BOTTLE   Final   Special Requests Normal   Final   Culture  Setup Time     Final   Value: 05/17/2014 21:15     Performed at [facility]   Culture     Final   Value:        BLOOD CULTURE RECEIVED NO GROWTH TO DATE CULTURE WILL BE HELD FOR 5 DAYS BEFORE ISSUING A FINAL NEGATIVE REPORT     Performed at [facility]   Report Status PENDING   Incomplete  CULTURE, BLOOD (ROUTINE X 2)     Status: None   Collection Time    05/17/14  4:57 PM      Result Value Ref Range Status   Specimen Description BLOOD RIGHT FOREARM  4 ML IN [facility] BOTTLE   Final   Special Requests Normal   Final   Culture  Setup Time     Final   Value: 05/17/2014 21:15     Performed at [facility]   Culture     Final   Value:        BLOOD CULTURE RECEIVED NO GROWTH TO DATE CULTURE WILL BE HELD FOR 5 DAYS BEFORE ISSUING A FINAL NEGATIVE REPORT     Performed at [facility]   Report Status PENDING   Incomplete  MRSA PCR SCREENING     Status: None   Collection Time    05/17/14  5:40 PM      Result Value Ref Range Status   MRSA by PCR NEGATIVE  NEGATIVE Final   Comment:            The GeneXpert MRSA Assay (FDA     approved for NASAL specimens     only), is one component of a     comprehensive MRSA colonization     surveillance program. It is not     intended to diagnose MRSA     infection nor to guide or     monitor treatment for     MRSA infections.     Studies: No results found.  Scheduled Meds: . amLODipine  10 mg Oral Daily  . antiseptic oral rinse  15 mL Mouth Rinse BID  . aspirin EC  325 mg Oral Daily  . atorvastatin  40 mg Oral q1800  . azithromycin  500 mg Oral Daily  . cefTRIAXone (ROCEPHIN)  IV  1 g Intravenous Q24H  . cloNIDine  0.3 mg Oral TID  . heparin  5,000 Units Subcutaneous 3 times per day  . hydrALAZINE  25 mg Oral 3 times per day  . insulin aspart  0-15 Units Subcutaneous TID WC  . insulin aspart  0-5 Units Subcutaneous QHS  .  isosorbide mononitrate  60 mg Oral Daily  .  lactose free nutrition  237 mL Oral BID BM  . metoprolol succinate  100 mg Oral Daily  . sodium chloride  3 mL Intravenous Q12H  . timolol  1 drop Right Eye q morning - 10a   Continuous Infusions:    Time spent:> 35 minutes    Velvet Bathe  [facility] Pager [phone] If 7PM-7AM, please contact night-coverage at [email or web address], password [facility] 05/21/2014, 3:42 PM  LOS: 4 days

## 2014-05-21 NOTE — Progress Notes (Addendum)
Patient Name: [name] [name] Date of Encounter: 05/21/2014     Principal Problem:   Hypertensive emergency Active Problems:   Type II or unspecified type diabetes mellitus with renal manifestations, uncontrolled   HYPERLIPIDEMIA   Chronic kidney disease, stage IV (severe)   CAP (community acquired pneumonia)   Malignant hypertension   Chest pain   Elevated troponin   NSTEMI (non-ST elevated myocardial infarction)    SUBJECTIVE  The patient has had a good night.  No chest pain or shortness of breath.  Blood pressure showing gradual improvement although  systolic pressure still above 200 at times  CURRENT MEDS . antiseptic oral rinse  15 mL Mouth Rinse BID  . aspirin EC  325 mg Oral Daily  . atorvastatin  40 mg Oral q1800  . azithromycin  500 mg Intravenous Q24H  . cefTRIAXone (ROCEPHIN)  IV  1 g Intravenous Q24H  . cloNIDine  0.3 mg Oral TID  . heparin  5,000 Units Subcutaneous 3 times per day  . insulin aspart  0-15 Units Subcutaneous TID WC  . insulin aspart  0-5 Units Subcutaneous QHS  . isosorbide mononitrate  60 mg Oral Daily  . lactose free nutrition  237 mL Oral BID BM  . metoprolol succinate  100 mg Oral Daily  . sodium chloride  3 mL Intravenous Q12H  . timolol  1 drop Right Eye q morning - 10a    OBJECTIVE  Filed Vitals:   05/21/14 0445 05/21/14 0500 05/21/14 0514 05/21/14 0600  BP: 215/74 202/83  208/73  Pulse: 57 57  59  Temp:   98.1 F (36.7 C)   TempSrc:   Oral   Resp: 15 15  17   Height:      Weight:   158 lb 1.1 oz (71.7 kg)   SpO2: 97% 98%  98%    Intake/Output Summary (Last 24 hours) at 05/21/14 0703 Last data filed at 05/21/14 [id number]  Gross per 24 hour  Intake  936.5 ml  Output   1475 ml  Net -538.5 ml   Filed Weights   05/19/14 0500 05/20/14 0400 05/21/14 0514  Weight: 147 lb 0.8 oz (66.7 kg) 154 lb 5.2 oz (70 kg) 158 lb 1.1 oz (71.7 kg)    PHYSICAL EXAM  General: Pleasant, NAD. Neuro: Alert and oriented X 3. Moves all  extremities spontaneously. Psych: Normal affect. HEENT:  Normal  Neck: Supple without bruits or JVD. Lungs:  Resp regular and unlabored, CTA. Heart: RRR no s3, s4, or murmurs. Abdomen: Soft, non-tender, non-distended, BS + x 4.  Extremities: No clubbing, cyanosis or edema. DP/PT/Radials 2+ and equal bilaterally.  Accessory Clinical Findings  CBC  Recent Labs  05/20/14 0355 05/21/14 0333  WBC 4.0 3.4*  HGB 9.1* 9.7*  HCT 26.0* 28.1*  MCV 88.1 85.7  PLT 90* [id number]*   Basic Metabolic Panel  Recent Labs  05/20/14 0355 05/21/14 0333  NA 138 140  K 3.8 3.7  CL 105 105  CO2 20 21  GLUCOSE 121* 121*  BUN 53* 58*  CREATININE 4.06* 4.04*  CALCIUM 7.6* 8.1*   Liver Function Tests No results found for this basename: AST, ALT, ALKPHOS, BILITOT, PROT, ALBUMIN,  in the last 72 hours No results found for this basename: LIPASE, AMYLASE,  in the last 72 hours Cardiac Enzymes No results found for this basename: CKTOTAL, CKMB, CKMBINDEX, TROPONINI,  in the last 72 hours BNP No components found with this basename: POCBNP,  D-Dimer No results found for  this basename: DDIMER,  in the last 72 hours Hemoglobin A1C No results found for this basename: HGBA1C,  in the last 72 hours Fasting Lipid Panel No results found for this basename: CHOL, HDL, LDLCALC, TRIG, CHOLHDL, LDLDIRECT,  in the last 72 hours Thyroid Function Tests No results found for this basename: TSH, T4TOTAL, FREET3, T3FREE, THYROIDAB,  in the last 72 hours  TELE  Normal sinus rhythm  ECG    Radiology/Studies  Dg Chest 2 View  05/17/2014   CLINICAL DATA:  Shortness of breath. Abdominal pain with nausea and vomiting. Current history of hypertension and diabetes.  EXAM: CHEST  2 VIEW  COMPARISON:  02/09/2007, 09/12/2006.  FINDINGS: AP semi-erect and lateral images were obtained. Moderate cardiac enlargement, with significant interval increase in the heart size since 2008, even allowing for differences in technique.  Pulmonary venous hypertension and likely minimal interstitial pulmonary edema. Airspace consolidation in the left lower lobe with silhouetting of the left hemidiaphragm. No confluent airspace consolidation elsewhere. Small bilateral pleural effusions. Degenerative changes involving the thoracic spine.  IMPRESSION: 1. Moderate cardiac enlargement with significant interval increase in heart size since 2008. 2. Pulmonary venous hypertension with perhaps minimal/incipient pulmonary edema. 3. Dense left lower lobe atelectasis versus pneumonia. Pneumonia is favored. 4. Small bilateral pleural effusions.   Electronically Signed   By: Evangeline Dakin M.D.   On: 05/17/2014 15:24   Dg Chest Port 1 View  05/18/2014   CLINICAL DATA:  Shortness of breath. Community acquired pneumonia. Chest pain. Myocardial infarct. Chronic kidney disease.  EXAM: CHEST - 1 VIEW  COMPARISON:  05/17/2014  FINDINGS: Persistent airspace opacity is seen in the left retrocardiac lung base with silhouetting of the left hemidiaphragm. This shows no significant change since previous study. Right lung remains clear. Heart size is stable.  IMPRESSION: No significant change in retrocardiac left lower lobe opacity, suspicious for pneumonia.   Electronically Signed   By: Earle Gell M.D.   On: 05/18/2014 09:27    ASSESSMENT AND PLAN 1. hypertensive emergency 2. non-STEMI 3. acute on chronic kidney disease stage IV  Plan: She has been off nicardipine since midnight.  We will leave off nicardipine and resume amlodipine for blood pressure control. Consider Lexi scan Myoview later this week when blood pressure stabilizes further  Signed, Darlin Coco MD

## 2014-05-22 ENCOUNTER — Inpatient Hospital Stay (HOSPITAL_COMMUNITY): Payer: Medicare HMO

## 2014-05-22 DIAGNOSIS — I10 Essential (primary) hypertension: Secondary | ICD-10-CM

## 2014-05-22 DIAGNOSIS — N184 Chronic kidney disease, stage 4 (severe): Secondary | ICD-10-CM

## 2014-05-22 LAB — GLUCOSE, CAPILLARY
GLUCOSE-CAPILLARY: 105 mg/dL — AB (ref 70–99)
GLUCOSE-CAPILLARY: 165 mg/dL — AB (ref 70–99)
GLUCOSE-CAPILLARY: 117 mg/dL — AB (ref 70–99)
Glucose-Capillary: 139 mg/dL — ABNORMAL HIGH (ref 70–99)

## 2014-05-22 MED ORDER — SPIRONOLACTONE 25 MG PO TABS
12.5000 mg | ORAL_TABLET | Freq: Every day | ORAL | Status: DC
  Administered 2014-05-22 – 2014-05-23 (×2): 12.5 mg via ORAL
  Filled 2014-05-22 (×2): qty 1

## 2014-05-22 MED ORDER — CARVEDILOL 12.5 MG PO TABS
12.5000 mg | ORAL_TABLET | Freq: Two times a day (BID) | ORAL | Status: DC
  Administered 2014-05-22 – 2014-05-23 (×3): 12.5 mg via ORAL
  Filled 2014-05-22 (×3): qty 1

## 2014-05-22 MED ORDER — SENNA 8.6 MG PO TABS
1.0000 | ORAL_TABLET | Freq: Every day | ORAL | Status: DC | PRN
Start: 2014-05-22 — End: 2014-05-27
  Administered 2014-05-22: 8.6 mg via ORAL
  Filled 2014-05-22 (×2): qty 1

## 2014-05-22 MED ORDER — LABETALOL HCL 5 MG/ML IV SOLN
10.0000 mg | INTRAVENOUS | Status: DC | PRN
  Administered 2014-05-22 – 2014-05-29 (×7): 10 mg via INTRAVENOUS
  Filled 2014-05-22 (×7): qty 4

## 2014-05-22 MED ORDER — FUROSEMIDE 10 MG/ML IJ SOLN
40.0000 mg | Freq: Once | INTRAMUSCULAR | Status: AC
  Administered 2014-05-22: 40 mg via INTRAVENOUS
  Filled 2014-05-22: qty 4

## 2014-05-22 NOTE — Progress Notes (Signed)
[facility] PROGRESS NOTE  [name] [name] [id number] DOB: Mar 16, 1941 DOA: 05/17/2014 PCP: Gwendolyn Grant, MD  Assessment/Plan: 1. Hypertensive emergency - Amlodipine on board - Continue with hydralazine injection 10 mg IV PRN SBP > 180 or DBP > 110 - continue to monitor closely in stepdown unit - Pt is on amlodipine, coreg, clonidine, and imdur.  Will add spironolactone - She reports side effect of nightmares and hallucination with hydralazine.  I have indicated that while in the hospital I will give her hydralazine only if her systolic blood pressure diastolic blood pressure are above-stated values despite oral antihypertensive medication  2. Chest pain/and NSTEMI - Cardiology on board and managing - Patient is currently on aspirin, beta blocker, statin. No ACE inhibitor due to renal dysfunction. Anticoagulation held in setting of severe hypertension. - We'll administer 1 time Lasix dose as diuretic recommended by cardiologist.  3. Acute on chronic kidney failure - Most likely related to end organ damage from recent hypertensive emergency. - Will continue to monitor serum creatinine and adjust blood pressure medications  4. diabetes mellitus - Continue sliding scale insulin  5. CAP - Currently on azithromycin and Rocephin. - Left lower lobe opacity reported on chest x-ray  Code Status: full Family Communication: No family at bedside Disposition Plan: Pending improvement in condition, for now stepdown   Consultants:  Cardiology   Antibiotics:  As listed above  HPI/Subjective: No new complaints. Pt reports of some of her medicine gave her nightmares and daughter reports she gets hallucinations with hydralazine. I have indicated that I will only use hydralazine should patient's systolic blood pressure or diastolic blood pressure continue to be elevated despite oral antihypertensive use of which patient and family members are agreeable to.  Objective: Filed  Vitals:   05/22/14 1010  BP: 214/92  Pulse:   Temp:   Resp:     Intake/Output Summary (Last 24 hours) at 05/22/14 1016 Last data filed at 05/22/14 0900  Gross per 24 hour  Intake    620 ml  Output   1700 ml  Net  -1080 ml   Filed Weights   05/19/14 0500 05/20/14 0400 05/21/14 0514  Weight: 66.7 kg (147 lb 0.8 oz) 70 kg (154 lb 5.2 oz) 71.7 kg (158 lb 1.1 oz)    Exam:   General:  Patient in no acute distress, alert and awake  Cardiovascular: Regular rate and rhythm, no murmurs  Respiratory: No increased work of breathing, and no wheezes  Abdomen: Soft, nondistended, nontender  Musculoskeletal: No cyanosis or clubbing   Data Reviewed: Basic Metabolic Panel:  Recent Labs Lab 05/17/14 1435 05/18/14 0622 05/19/14 0354 05/20/14 0355 05/21/14 0333  NA 144 144 138 138 140  K 4.2 3.9 3.9 3.8 3.7  CL 107 109 103 105 105  CO2 24 24 21 20 21   GLUCOSE 137* 122* 85 121* 121*  BUN 47* 47* 48* 53* 58*  CREATININE 4.20* 4.06* 3.99* 4.06* 4.04*  CALCIUM 9.4 8.3* 8.2* 7.6* 8.1*   Liver Function Tests: No results found for this basename: AST, ALT, ALKPHOS, BILITOT, PROT, ALBUMIN,  in the last 168 hours No results found for this basename: LIPASE, AMYLASE,  in the last 168 hours No results found for this basename: AMMONIA,  in the last 168 hours CBC:  Recent Labs Lab 05/17/14 1435 05/18/14 0622 05/19/14 0354 05/20/14 0355 05/21/14 0333  WBC 4.0 5.0 4.6 4.0 3.4*  HGB 8.3* 6.6* 9.2* 9.1* 9.7*  HCT 25.2* 20.0* 27.0* 26.0* 28.1*  MCV  92.3 93.0 85.7 88.1 85.7  PLT 146* 129* 106* 90* 102*   Cardiac Enzymes:  Recent Labs Lab 05/17/14 1435 05/17/14 1815 05/18/14 0005 05/18/14 0622  TROPONINI 3.30* 3.16* 4.60* 3.19*   BNP (last 3 results)  Recent Labs  05/17/14 1956  PROBNP 20847.0*   CBG:  Recent Labs Lab 05/21/14 0809 05/21/14 1148 05/21/14 1627 05/21/14 2143 05/22/14 0740  GLUCAP 155* 101* 124* 150* 105*    Recent Results (from the past 240  hour(s))  CLOSTRIDIUM DIFFICILE BY PCR     Status: None   Collection Time    05/17/14  4:01 PM      Result Value Ref Range Status   C difficile by pcr NEGATIVE  NEGATIVE Final   Comment: Performed at [place], BLOOD (ROUTINE X 2)     Status: None   Collection Time    05/17/14  4:57 PM      Result Value Ref Range Status   Specimen Description BLOOD LEFT FOREARM  4 ML IN [facility] BOTTLE   Final   Special Requests Normal   Final   Culture  Setup Time     Final   Value: 05/17/2014 21:15     Performed at [facility]   Culture     Final   Value:        BLOOD CULTURE RECEIVED NO GROWTH TO DATE CULTURE WILL BE HELD FOR 5 DAYS BEFORE ISSUING A FINAL NEGATIVE REPORT     Performed at [facility]   Report Status PENDING   Incomplete  CULTURE, BLOOD (ROUTINE X 2)     Status: None   Collection Time    05/17/14  4:57 PM      Result Value Ref Range Status   Specimen Description BLOOD RIGHT FOREARM  4 ML IN [facility] BOTTLE   Final   Special Requests Normal   Final   Culture  Setup Time     Final   Value: 05/17/2014 21:15     Performed at [facility]   Culture     Final   Value:        BLOOD CULTURE RECEIVED NO GROWTH TO DATE CULTURE WILL BE HELD FOR 5 DAYS BEFORE ISSUING A FINAL NEGATIVE REPORT     Performed at [facility]   Report Status PENDING   Incomplete  MRSA PCR SCREENING     Status: None   Collection Time    05/17/14  5:40 PM      Result Value Ref Range Status   MRSA by PCR NEGATIVE  NEGATIVE Final   Comment:            The GeneXpert MRSA Assay (FDA     approved for NASAL specimens     only), is one component of a     comprehensive MRSA colonization     surveillance program. It is not     intended to diagnose MRSA     infection nor to guide or     monitor treatment for     MRSA infections.     Studies: Dg Abd Portable 2v  05/22/2014   CLINICAL DATA:  Abdominal distention.  No bowel movement for 4 days.  EXAM: PORTABLE ABDOMEN  - 2 VIEW  COMPARISON:  None.  FINDINGS: There is a moderate stool burden in the RIGHT colon. Cholecystectomy clips are present in the right upper quadrant. No free air identified on decubitus view. Atherosclerosis. The bowel gas  pattern is nonobstructive. There are no dilated loops of large or small bowel. No pathologic air-fluid levels.  IMPRESSION: Nonobstructive bowel gas pattern. Moderate stool burden. No acute abnormality.   Electronically Signed   By: Dereck Ligas M.D.   On: 05/22/2014 07:07    Scheduled Meds: . amLODipine  10 mg Oral Daily  . antiseptic oral rinse  15 mL Mouth Rinse BID  . aspirin EC  325 mg Oral Daily  . atorvastatin  40 mg Oral q1800  . azithromycin  500 mg Oral Daily  . carvedilol  12.5 mg Oral BID WC  . cefTRIAXone (ROCEPHIN)  IV  1 g Intravenous Q24H  . cloNIDine  0.3 mg Oral TID  . feeding supplement (GLUCERNA SHAKE)  237 mL Oral TID BM  . heparin  5,000 Units Subcutaneous 3 times per day  . insulin aspart  0-15 Units Subcutaneous TID WC  . insulin aspart  0-5 Units Subcutaneous QHS  . isosorbide mononitrate  60 mg Oral Daily  . sodium chloride  3 mL Intravenous Q12H  . spironolactone  12.5 mg Oral Daily  . timolol  1 drop Right Eye q morning - 10a   Continuous Infusions:    Time spent:> 35 minutes    Velvet Bathe  [facility] Pager [phone] If 7PM-7AM, please contact night-coverage at [email or web address], password [facility] 05/22/2014, 10:16 AM  LOS: 5 days

## 2014-05-22 NOTE — Progress Notes (Addendum)
Patient has difficulty swallowing pills.  She swallowed the pills well when crushed with applesauce.

## 2014-05-22 NOTE — Progress Notes (Signed)
Pt with b/p of 191/63 after metoprolol given. Pt also c/o abd distention. Pt states she had normal bm on sat. Called md awaiting call back.

## 2014-05-22 NOTE — Progress Notes (Signed)
Patient ID: [name] [name], female   DOB: January 21, 1941, 73 y.o.   MRN: [id number]  Date of Encounter: 05/22/2014   Principal Problem:   Hypertensive emergency Active Problems:   Type II or unspecified type diabetes mellitus with renal manifestations, uncontrolled   Malignant hypertension   Elevated troponin   NSTEMI (non-ST elevated myocardial infarction)   HYPERLIPIDEMIA   Chronic kidney disease, stage IV (severe)   CAP (community acquired pneumonia)   Chest pain    SUBJECTIVE The patient feels well today.  No chest pain or shortness of breath today, but did yesterday. C/o no BM in several days, abdomen tight with GERD sensation.  CURRENT MEDS . amLODipine  10 mg Oral Daily  . antiseptic oral rinse  15 mL Mouth Rinse BID  . aspirin EC  325 mg Oral Daily  . atorvastatin  40 mg Oral q1800  . azithromycin  500 mg Oral Daily  . cefTRIAXone (ROCEPHIN)  IV  1 g Intravenous Q24H  . cloNIDine  0.3 mg Oral TID  . feeding supplement (GLUCERNA SHAKE)  237 mL Oral TID BM  . heparin  5,000 Units Subcutaneous 3 times per day  . insulin aspart  0-15 Units Subcutaneous TID WC  . insulin aspart  0-5 Units Subcutaneous QHS  . isosorbide mononitrate  60 mg Oral Daily  . metoprolol succinate  100 mg Oral Daily  . sodium chloride  3 mL Intravenous Q12H  . timolol  1 drop Right Eye q morning - 10a    OBJECTIVE  Filed Vitals:   05/22/14 0400 05/22/14 0405 05/22/14 0410 05/22/14 0415  BP: 212/90 215/87  203/70  Pulse: 65 62 58 60  Temp: 98.2 F (36.8 C)     TempSrc: Oral     Resp: 20 17 18 18   Height:      Weight:      SpO2: 99% 99% 98% 99%    Intake/Output Summary (Last 24 hours) at 05/22/14 0713 Last data filed at 05/22/14 0400  Gross per 24 hour  Intake   1200 ml  Output   1700 ml  Net   -500 ml   Filed Weights   05/19/14 0500 05/20/14 0400 05/21/14 0514  Weight: 147 lb 0.8 oz (66.7 kg) 154 lb 5.2 oz (70 kg) 158 lb 1.1 oz (71.7 kg)  11 lb weight gain is not  likley!  PHYSICAL EXAM  General: Pleasant, NAD. Neuro: Alert and oriented X 3. Moves all extremities spontaneously. Psych: Normal affect. HEENT:  Normal  Neck: Supple without bruits or JVD. Lungs:  Rales at bases improved, non-labored Heart: RRR no s3, s4, or murmurs. Abdomen: mildly rotuberant (not truly distended), mildly tender & firm, BS acutally increased  Extremities: No clubbing, cyanosis or edema. DP/PT/Radials 2+ and equal bilaterally.  Accessory Clinical Findings  CBC  Recent Labs  05/20/14 0355 05/21/14 0333  WBC 4.0 3.4*  HGB 9.1* 9.7*  HCT 26.0* 28.1*  MCV 88.1 85.7  PLT 90* [id number]*   Basic Metabolic Panel  Recent Labs  05/20/14 0355 05/21/14 0333  NA 138 140  K 3.8 3.7  CL 105 105  CO2 20 21  GLUCOSE 121* 121*  BUN 53* 58*  CREATININE 4.06* 4.04*  CALCIUM 7.6* 8.1*   TELE: Normal sinus rhythm  ECG  2-D echo: - Left ventricle: The cavity size was normal.  mild LVH - focal basal hypertrophy. Systolic Fxn normal - EF  60% to 65%. Wall motion was normal; Doppler parameters are consistent with  abnormal left ventricular relaxation (grade 1 diastolic dysfunction). - Left atrium: The atrium was mildly dilated.  Radiology/Studies- reviewed.  ASSESSMENT AND PLAN 73 yo female hx of HTN, Hep B, HL, DM, and CKD admitted with cough, SOB, chest pain.  1. Hypertensive emergency - initial SBPs in the 240s (MAP 163), evidence of end organ damage with NSTEMI and AKI on CKD   Can now slowly start to liberalize BP goals - would target SBP 140-170 mmHg as she is likely to become lethargic & weak with lower BPs initially.  Nicardipine converted to Amlodipine yesterday will take a few days to see full effect.   Would consider converting BB from Metoprolol to Carvedilol.  Is not on a diuretic, but still diuresis spontaneously despite Acute Renal Injury -- with ~11 lb wgt gain, not sure if this is accurate, as no real sign of volume overload.  Would check with  Nephrology, but consider standing diuretic as her LVEDP is quite likely high.  Ordered Renal Artery Dopplers  2. NSTEMI - peak trop 4.6 and trending down, EKG without specific ischemic changes. Occurred in setting of severe HTN and possible pneumonia as well   she is on ASA, beta blocker, statin. No ACE due to renal dysfunction. Anticoag held in setting of severe HTN.   Echocardiogram shows normal LV  function and no wall motion abnormalities --> plan Non-invasive ischemic evaluation as Cath would not be prudent @ this time with her poor renal function.  3.  ARF - she may well have done significant long-term damage & will probably need a Nephrologist on discharge - consider getting them involved prior to d/c to expedite f/u appt.  4.  Constipation -- no BM in several days; Bowel Regimen per [facility] (no sign of impaction on Abd XRay)  Need to mobilize.  Signed, Leonie Man, M.D., M.S. Interventional Cardiologist   Pager # [phone] 05/22/2014

## 2014-05-22 NOTE — Plan of Care (Signed)
Problem: Phase II Progression Outcomes Goal: Vital signs remain stable Outcome: Not Progressing Systolic Bp remains high.

## 2014-05-22 NOTE — Progress Notes (Signed)
Spoke with MD and stated to continue to monitor pt status. No new interventions at this time. md also stated he would order x ray for pt's abd distention.

## 2014-05-22 NOTE — Progress Notes (Signed)
Bilateral renal artery duplex completed.  The study is non diagnostic due to the patient's body habitus.

## 2014-05-23 LAB — BASIC METABOLIC PANEL
ANION GAP: 16 — AB (ref 5–15)
BUN: 60 mg/dL — ABNORMAL HIGH (ref 6–23)
CALCIUM: 8.3 mg/dL — AB (ref 8.4–10.5)
CO2: 20 meq/L (ref 19–32)
CREATININE: 4.05 mg/dL — AB (ref 0.50–1.10)
Chloride: 103 mEq/L (ref 96–112)
GFR calc Af Amer: 12 mL/min — ABNORMAL LOW (ref >90)
GFR, EST NON AFRICAN AMERICAN: 10 mL/min — AB (ref >90)
Glucose, Bld: 83 mg/dL (ref 70–99)
Potassium: 4 mEq/L (ref 3.7–5.3)
Sodium: 139 mEq/L (ref 137–147)

## 2014-05-23 LAB — CBC
HEMATOCRIT: 26.8 % — AB (ref 36.0–46.0)
Hemoglobin: 9.1 g/dL — ABNORMAL LOW (ref 12.0–15.0)
MCH: 29.8 pg (ref 26.0–34.0)
MCHC: 34 g/dL (ref 30.0–36.0)
MCV: 87.9 fL (ref 78.0–100.0)
Platelets: 98 10*3/uL — ABNORMAL LOW (ref 150–400)
RBC: 3.05 MIL/uL — ABNORMAL LOW (ref 3.87–5.11)
RDW: 15.4 % (ref 11.5–15.5)
WBC: 3.9 10*3/uL — ABNORMAL LOW (ref 4.0–10.5)

## 2014-05-23 LAB — GLUCOSE, CAPILLARY
GLUCOSE-CAPILLARY: 101 mg/dL — AB (ref 70–99)
GLUCOSE-CAPILLARY: 165 mg/dL — AB (ref 70–99)
GLUCOSE-CAPILLARY: 103 mg/dL — AB (ref 70–99)
Glucose-Capillary: 142 mg/dL — ABNORMAL HIGH (ref 70–99)

## 2014-05-23 LAB — CULTURE, BLOOD (ROUTINE X 2)
Culture: NO GROWTH
Culture: NO GROWTH
Special Requests: NORMAL
Special Requests: NORMAL

## 2014-05-23 MED ORDER — SPIRONOLACTONE 25 MG PO TABS: 12.5000 mg | ORAL_TABLET | Freq: Once | ORAL | Status: DC

## 2014-05-23 MED ORDER — SPIRONOLACTONE 25 MG PO TABS
12.5000 mg | ORAL_TABLET | Freq: Once | ORAL | Status: AC
  Administered 2014-05-23: 12.5 mg via ORAL
  Filled 2014-05-23: qty 1

## 2014-05-23 MED ORDER — SPIRONOLACTONE 25 MG PO TABS
25.0000 mg | ORAL_TABLET | Freq: Every day | ORAL | Status: DC
  Administered 2014-05-24 – 2014-05-25 (×2): 25 mg via ORAL
  Filled 2014-05-23 (×2): qty 1

## 2014-05-23 MED ORDER — CARVEDILOL 12.5 MG PO TABS
12.5000 mg | ORAL_TABLET | Freq: Two times a day (BID) | ORAL | Status: DC
  Administered 2014-05-23: 12.5 mg via ORAL
  Filled 2014-05-23: qty 1

## 2014-05-23 MED ORDER — CARVEDILOL 12.5 MG PO TABS: 25.0000 mg | ORAL_TABLET | Freq: Two times a day (BID) | ORAL | Status: DC

## 2014-05-23 NOTE — Progress Notes (Signed)
[facility] PROGRESS NOTE  RITHVIKA KOFRON [id number] DOB: December 31, 1940 DOA: 05/17/2014 PCP: Gwendolyn Grant, MD  Assessment/Plan: 1. Hypertensive emergency - Continue with hydralazine injection 10 mg IV PRN SBP > 180 or DBP > 110 - If blood pressures remain consistently below 180 will plan on transitioning to floor. - Pt is on amlodipine, coreg, clonidine, and imdur.  Added spironolactone and will increased dose today. - She reports side effect of nightmares and hallucination with hydralazine.  I have indicated that while in the hospital I will give her hydralazine only if her systolic blood pressure diastolic blood pressure are above-stated values despite oral antihypertensive medication - Will work up for secondary causes specifically for hyperaldosteronism. (Aldosterone and renin plasma levels with ratio ordered)  2. Chest pain/and NSTEMI - Cardiology on board and managing - Patient is currently on aspirin, beta blocker, statin. No ACE inhibitor due to renal dysfunction. Anticoagulation held in setting of severe hypertension.  3. Acute on chronic kidney failure - Most likely related to end organ damage from recent hypertensive emergency. - Will continue to monitor serum creatinine and adjust blood pressure medications  4. diabetes mellitus - Continue sliding scale insulin  5. CAP - Currently on azithromycin and Rocephin. - Left lower lobe opacity reported on chest x-ray  Code Status: full Family Communication: No family at bedside Disposition Plan: Pending improvement in condition, for now stepdown   Consultants:  Cardiology   Antibiotics:  As listed above  HPI/Subjective: No new complaints. No acute issues reported overnight.  Objective: Filed Vitals:   05/23/14 0925  BP: 158/67  Pulse: 63  Temp:   Resp: 22    Intake/Output Summary (Last 24 hours) at 05/23/14 0940 Last data filed at 05/23/14 0800  Gross per 24 hour  Intake    880 ml  Output     900 ml  Net    -20 ml   Filed Weights   05/20/14 0400 05/21/14 0514 05/23/14 0400  Weight: 70 kg (154 lb 5.2 oz) 71.7 kg (158 lb 1.1 oz) 68.9 kg (151 lb 14.4 oz)    Exam:   General:  Patient in no acute distress, alert and awake  Cardiovascular: Regular rate and rhythm, no murmurs  Respiratory: No increased work of breathing, and no wheezes  Abdomen: Soft, nondistended, nontender  Musculoskeletal: No cyanosis or clubbing   Data Reviewed: Basic Metabolic Panel:  Recent Labs Lab 05/18/14 0622 05/19/14 0354 05/20/14 0355 05/21/14 0333 05/23/14 0035  NA 144 138 138 140 139  K 3.9 3.9 3.8 3.7 4.0  CL 109 103 105 105 103  CO2 24 21 20 21 20   GLUCOSE 122* 85 121* 121* 83  BUN 47* 48* 53* 58* 60*  CREATININE 4.06* 3.99* 4.06* 4.04* 4.05*  CALCIUM 8.3* 8.2* 7.6* 8.1* 8.3*   Liver Function Tests: No results found for this basename: AST, ALT, ALKPHOS, BILITOT, PROT, ALBUMIN,  in the last 168 hours No results found for this basename: LIPASE, AMYLASE,  in the last 168 hours No results found for this basename: AMMONIA,  in the last 168 hours CBC:  Recent Labs Lab 05/18/14 0622 05/19/14 0354 05/20/14 0355 05/21/14 0333 05/23/14 0035  WBC 5.0 4.6 4.0 3.4* 3.9*  HGB 6.6* 9.2* 9.1* 9.7* 9.1*  HCT 20.0* 27.0* 26.0* 28.1* 26.8*  MCV 93.0 85.7 88.1 85.7 87.9  PLT 129* 106* 90* 102* 98*   Cardiac Enzymes:  Recent Labs Lab 05/17/14 1435 05/17/14 1815 05/18/14 0005 05/18/14 0622  TROPONINI 3.30* 3.16* 4.60*  3.19*   BNP (last 3 results)  Recent Labs  05/17/14 1956  PROBNP 20847.0*   CBG:  Recent Labs Lab 05/22/14 0740 05/22/14 1233 05/22/14 1714 05/22/14 2105 05/23/14 0735  GLUCAP 105* 139* 165* 117* 101*    Recent Results (from the past 240 hour(s))  CLOSTRIDIUM DIFFICILE BY PCR     Status: None   Collection Time    05/17/14  4:01 PM      Result Value Ref Range Status   C difficile by pcr NEGATIVE  NEGATIVE Final   Comment: Performed at [place], BLOOD (ROUTINE X 2)     Status: None   Collection Time    05/17/14  4:57 PM      Result Value Ref Range Status   Specimen Description BLOOD LEFT FOREARM  4 ML IN [facility] BOTTLE   Final   Special Requests Normal   Final   Culture  Setup Time     Final   Value: 05/17/2014 21:15     Performed at [facility]   Culture     Final   Value: NO GROWTH 5 DAYS     Performed at [facility]   Report Status 05/23/2014 FINAL   Final  CULTURE, BLOOD (ROUTINE X 2)     Status: None   Collection Time    05/17/14  4:57 PM      Result Value Ref Range Status   Specimen Description BLOOD RIGHT FOREARM  4 ML IN [facility] BOTTLE   Final   Special Requests Normal   Final   Culture  Setup Time     Final   Value: 05/17/2014 21:15     Performed at [facility]   Culture     Final   Value: NO GROWTH 5 DAYS     Performed at [facility]   Report Status 05/23/2014 FINAL   Final  MRSA PCR SCREENING     Status: None   Collection Time    05/17/14  5:40 PM      Result Value Ref Range Status   MRSA by PCR NEGATIVE  NEGATIVE Final   Comment:            The GeneXpert MRSA Assay (FDA     approved for NASAL specimens     only), is one component of a     comprehensive MRSA colonization     surveillance program. It is not     intended to diagnose MRSA     infection nor to guide or     monitor treatment for     MRSA infections.     Studies: Dg Abd Portable 2v  05/22/2014   CLINICAL DATA:  Abdominal distention.  No bowel movement for 4 days.  EXAM: PORTABLE ABDOMEN - 2 VIEW  COMPARISON:  None.  FINDINGS: There is a moderate stool burden in the RIGHT colon. Cholecystectomy clips are present in the right upper quadrant. No free air identified on decubitus view. Atherosclerosis. The bowel gas pattern is nonobstructive. There are no dilated loops of large or small bowel. No pathologic air-fluid levels.  IMPRESSION: Nonobstructive bowel gas pattern. Moderate stool burden. No  acute abnormality.   Electronically Signed   By: Dereck Ligas M.D.   On: 05/22/2014 07:07    Scheduled Meds: . amLODipine  10 mg Oral Daily  . antiseptic oral rinse  15 mL Mouth Rinse BID  . aspirin EC  325 mg Oral Daily  .  atorvastatin  40 mg Oral q1800  . azithromycin  500 mg Oral Daily  . carvedilol  12.5 mg Oral BID WC  . cefTRIAXone (ROCEPHIN)  IV  1 g Intravenous Q24H  . cloNIDine  0.3 mg Oral TID  . feeding supplement (GLUCERNA SHAKE)  237 mL Oral TID BM  . heparin  5,000 Units Subcutaneous 3 times per day  . insulin aspart  0-15 Units Subcutaneous TID WC  . insulin aspart  0-5 Units Subcutaneous QHS  . isosorbide mononitrate  60 mg Oral Daily  . sodium chloride  3 mL Intravenous Q12H  . [START ON 05/24/2014] spironolactone  25 mg Oral Daily  . timolol  1 drop Right Eye q morning - 10a   Continuous Infusions:    Time spent:> 35 minutes    Velvet Bathe  [facility] Pager [phone] If 7PM-7AM, please contact night-coverage at [email or web address], password [facility] 05/23/2014, 9:40 AM  LOS: 6 days

## 2014-05-23 NOTE — Progress Notes (Signed)
Patient Name: [name] [name] Date of Encounter: 05/23/2014     Principal Problem:   Hypertensive emergency Active Problems:   Type II or unspecified type diabetes mellitus with renal manifestations, uncontrolled   HYPERLIPIDEMIA   Chronic kidney disease, stage IV (severe)   CAP (community acquired pneumonia)   Malignant hypertension   Chest pain   Elevated troponin   NSTEMI (non-ST elevated myocardial infarction)    SUBJECTIVE  Patient feels well this morning.  No chest pain or shortness of breath.  Blood pressure are under better control overnight.  Tolerating being up in chair yesterday.  CURRENT MEDS . amLODipine  10 mg Oral Daily  . antiseptic oral rinse  15 mL Mouth Rinse BID  . aspirin EC  325 mg Oral Daily  . atorvastatin  40 mg Oral q1800  . azithromycin  500 mg Oral Daily  . carvedilol  12.5 mg Oral BID WC  . cefTRIAXone (ROCEPHIN)  IV  1 g Intravenous Q24H  . cloNIDine  0.3 mg Oral TID  . feeding supplement (GLUCERNA SHAKE)  237 mL Oral TID BM  . heparin  5,000 Units Subcutaneous 3 times per day  . insulin aspart  0-15 Units Subcutaneous TID WC  . insulin aspart  0-5 Units Subcutaneous QHS  . isosorbide mononitrate  60 mg Oral Daily  . sodium chloride  3 mL Intravenous Q12H  . spironolactone  12.5 mg Oral Daily  . timolol  1 drop Right Eye q morning - 10a    OBJECTIVE  Filed Vitals:   05/22/14 2200 05/23/14 0000 05/23/14 0200 05/23/14 0400  BP: 180/59 173/63 183/54 179/57  Pulse: 64  58 57  Temp:  97.9 F (36.6 C)  97.4 F (36.3 C)  TempSrc:  Oral  Oral  Resp: 16  14 14   Height:      Weight:    151 lb 14.4 oz (68.9 kg)  SpO2: 98%  99% 99%    Intake/Output Summary (Last 24 hours) at 05/23/14 0717 Last data filed at 05/23/14 0400  Gross per 24 hour  Intake   1120 ml  Output    750 ml  Net    370 ml   Filed Weights   05/20/14 0400 05/21/14 0514 05/23/14 0400  Weight: 154 lb 5.2 oz (70 kg) 158 lb 1.1 oz (71.7 kg) 151 lb 14.4 oz (68.9 kg)     PHYSICAL EXAM  General: Pleasant, NAD. Neuro: Alert and oriented X 3. Moves all extremities spontaneously. Psych: Normal affect. HEENT:  Normal  Neck: Supple without bruits or JVD. Lungs:  Resp regular and unlabored, CTA. Heart: RRR no s3, s4, or murmurs. Abdomen: Soft, non-tender, non-distended, BS + x 4.  Extremities: No clubbing, cyanosis or edema. DP/PT/Radials 2+ and equal bilaterally.  Accessory Clinical Findings  CBC  Recent Labs  05/21/14 0333 05/23/14 0035  WBC 3.4* 3.9*  HGB 9.7* 9.1*  HCT 28.1* 26.8*  MCV 85.7 87.9  PLT 102* 98*   Basic Metabolic Panel  Recent Labs  05/21/14 0333 05/23/14 0035  NA 140 139  K 3.7 4.0  CL 105 103  CO2 21 20  GLUCOSE 121* 83  BUN 58* 60*  CREATININE 4.04* 4.05*  CALCIUM 8.1* 8.3*   Liver Function Tests No results found for this basename: AST, ALT, ALKPHOS, BILITOT, PROT, ALBUMIN,  in the last 72 hours No results found for this basename: LIPASE, AMYLASE,  in the last 72 hours Cardiac Enzymes No results found for this basename: CKTOTAL,  CKMB, CKMBINDEX, TROPONINI,  in the last 72 hours BNP No components found with this basename: POCBNP,  D-Dimer No results found for this basename: DDIMER,  in the last 72 hours Hemoglobin A1C No results found for this basename: HGBA1C,  in the last 72 hours Fasting Lipid Panel No results found for this basename: CHOL, HDL, LDLCALC, TRIG, CHOLHDL, LDLDIRECT,  in the last 72 hours Thyroid Function Tests No results found for this basename: TSH, T4TOTAL, FREET3, T3FREE, THYROIDAB,  in the last 72 hours  TELE  Sinus bradycardia.  ECG    Radiology/Studies  Dg Chest 2 View  05/17/2014   CLINICAL DATA:  Shortness of breath. Abdominal pain with nausea and vomiting. Current history of hypertension and diabetes.  EXAM: CHEST  2 VIEW  COMPARISON:  02/09/2007, 09/12/2006.  FINDINGS: AP semi-erect and lateral images were obtained. Moderate cardiac enlargement, with significant interval  increase in the heart size since 2008, even allowing for differences in technique. Pulmonary venous hypertension and likely minimal interstitial pulmonary edema. Airspace consolidation in the left lower lobe with silhouetting of the left hemidiaphragm. No confluent airspace consolidation elsewhere. Small bilateral pleural effusions. Degenerative changes involving the thoracic spine.  IMPRESSION: 1. Moderate cardiac enlargement with significant interval increase in heart size since 2008. 2. Pulmonary venous hypertension with perhaps minimal/incipient pulmonary edema. 3. Dense left lower lobe atelectasis versus pneumonia. Pneumonia is favored. 4. Small bilateral pleural effusions.   Electronically Signed   By: Evangeline Dakin M.D.   On: 05/17/2014 15:24   Dg Chest Port 1 View  05/18/2014   CLINICAL DATA:  Shortness of breath. Community acquired pneumonia. Chest pain. Myocardial infarct. Chronic kidney disease.  EXAM: CHEST - 1 VIEW  COMPARISON:  05/17/2014  FINDINGS: Persistent airspace opacity is seen in the left retrocardiac lung base with silhouetting of the left hemidiaphragm. This shows no significant change since previous study. Right lung remains clear. Heart size is stable.  IMPRESSION: No significant change in retrocardiac left lower lobe opacity, suspicious for pneumonia.   Electronically Signed   By: Earle Gell M.D.   On: 05/18/2014 09:27   Dg Abd Portable 2v  05/22/2014   CLINICAL DATA:  Abdominal distention.  No bowel movement for 4 days.  EXAM: PORTABLE ABDOMEN - 2 VIEW  COMPARISON:  None.  FINDINGS: There is a moderate stool burden in the RIGHT colon. Cholecystectomy clips are present in the right upper quadrant. No free air identified on decubitus view. Atherosclerosis. The bowel gas pattern is nonobstructive. There are no dilated loops of large or small bowel. No pathologic air-fluid levels.  IMPRESSION: Nonobstructive bowel gas pattern. Moderate stool burden. No acute abnormality.    Electronically Signed   By: Dereck Ligas M.D.   On: 05/22/2014 07:07    ASSESSMENT AND PLAN  1. hypertensive emergency--blood pressure coming under control on current therapy of amlodipine, carvedilol, clonidine and spironolactone. 2. non-STEMI --peak trop 4.6 and trending down, EKG without specific ischemic changes. Occurred in setting of severe HTN and possible pneumonia as well  she is on ASA, beta blocker, statin. No ACE due to renal dysfunction. Anticoag held in setting of severe HTN.  Echocardiogram shows normal LV function and no wall motion abnormalities --> plan Non-invasive ischemic evaluation as outpatient as Cath would not be prudent @ this time with her poor renal function.  3. acute on chronic kidney disease stage IV--stable since starting spironolactone  Plan: Increase activity. Signed, Darlin Coco MD

## 2014-05-24 LAB — BASIC METABOLIC PANEL
Anion gap: 13 (ref 5–15)
BUN: 61 mg/dL — AB (ref 6–23)
CALCIUM: 8.5 mg/dL (ref 8.4–10.5)
CO2: 22 meq/L (ref 19–32)
Chloride: 104 mEq/L (ref 96–112)
Creatinine, Ser: 4.09 mg/dL — ABNORMAL HIGH (ref 0.50–1.10)
GFR calc Af Amer: 12 mL/min — ABNORMAL LOW (ref >90)
GFR calc non Af Amer: 10 mL/min — ABNORMAL LOW (ref >90)
GLUCOSE: 131 mg/dL — AB (ref 70–99)
Potassium: 4.6 mEq/L (ref 3.7–5.3)
Sodium: 139 mEq/L (ref 137–147)

## 2014-05-24 LAB — GLUCOSE, CAPILLARY
Glucose-Capillary: 122 mg/dL — ABNORMAL HIGH (ref 70–99)
Glucose-Capillary: 136 mg/dL — ABNORMAL HIGH (ref 70–99)
Glucose-Capillary: 99 mg/dL (ref 70–99)

## 2014-05-24 LAB — CBC
HCT: 26.2 % — ABNORMAL LOW (ref 36.0–46.0)
Hemoglobin: 8.8 g/dL — ABNORMAL LOW (ref 12.0–15.0)
MCH: 29.6 pg (ref 26.0–34.0)
MCHC: 33.6 g/dL (ref 30.0–36.0)
MCV: 88.2 fL (ref 78.0–100.0)
Platelets: 90 10*3/uL — ABNORMAL LOW (ref 150–400)
RBC: 2.97 MIL/uL — AB (ref 3.87–5.11)
RDW: 15.2 % (ref 11.5–15.5)
WBC: 3.4 10*3/uL — ABNORMAL LOW (ref 4.0–10.5)

## 2014-05-24 MED ORDER — CARVEDILOL 25 MG PO TABS
25.0000 mg | ORAL_TABLET | Freq: Two times a day (BID) | ORAL | Status: DC
  Administered 2014-05-24 – 2014-05-30 (×11): 25 mg via ORAL
  Filled 2014-05-24 (×6): qty 1
  Filled 2014-05-24: qty 2
  Filled 2014-05-24 (×12): qty 1

## 2014-05-24 MED ORDER — BISACODYL 10 MG RE SUPP
10.0000 mg | Freq: Once | RECTAL | Status: AC
  Administered 2014-05-24: 10 mg via RECTAL
  Filled 2014-05-24: qty 1

## 2014-05-24 NOTE — Evaluation (Addendum)
Physical Therapy Evaluation Patient Details Name: [name] [name] MRN: [id number] DOB: 1941-08-20 Today's Date: 05/24/2014   History of Present Illness  hypertensive emergency, NSTEMI, acute on chronic renal failure  Clinical Impression  **Pt admitted with *hypertensive emergency, NSTEMI, renal failure *. Pt currently with functional limitations due to the deficits listed below (see PT Problem List).  Pt will benefit from skilled PT to increase their independence and safety with mobility to allow discharge to the venue listed below.    Pt ambulated 47' with RW and min A for balance. At present she requires assist for walking due to mild unsteadiness. Good progress expected.  *    Follow Up Recommendations No PT follow up    Equipment Recommendations  None recommended by PT    Recommendations for Other Services       Precautions / Restrictions Precautions Precautions: Fall Restrictions Weight Bearing Restrictions: No      Mobility  Bed Mobility                  Transfers Overall transfer level: Needs assistance Equipment used: Rolling walker (2 wheeled) Transfers: Sit to/from Stand Sit to Stand: Min assist         General transfer comment: min A for balance  Ambulation/Gait Ambulation/Gait assistance: Min assist Ambulation Distance (Feet): 75 Feet Assistive device: Rolling walker (2 wheeled) Gait Pattern/deviations: WFL(Within Functional Limits);Trunk flexed   Gait velocity interpretation: at or above normal speed for age/gender General Gait Details: distance limited by need to use bathroom, pt recently had suppository  Stairs            Wheelchair Mobility    Modified Rankin (Stroke Patients Only)       Balance Overall balance assessment: Needs assistance   Sitting balance-Leahy Scale: Good       Standing balance-Leahy Scale: Poor                               Pertinent Vitals/Pain BP 146/60 sitting 0/10 pain     Home Living Family/patient expects to be discharged to:: Private residence Living Arrangements: Spouse/significant other Available Help at Discharge: Family;Available 24 hours/day   Home Access: Stairs to enter Entrance Stairs-Rails: Right Entrance Stairs-Number of Steps: 3 Home Layout: One level Home Equipment: Environmental consultant - 2 wheels (can borrow RW from daughter)      Prior Function Level of Independence: Independent               Hand Dominance        Extremity/Trunk Assessment   Upper Extremity Assessment: Overall WFL for tasks assessed           Lower Extremity Assessment: Overall WFL for tasks assessed      Cervical / Trunk Assessment: Normal  Communication   Communication: No difficulties  Cognition Arousal/Alertness: Awake/alert Behavior During Therapy: WFL for tasks assessed/performed Overall Cognitive Status: Within Functional Limits for tasks assessed                      General Comments      Exercises        Assessment/Plan    PT Assessment Patient needs continued PT services  PT Diagnosis Difficulty walking   PT Problem List Decreased activity tolerance;Decreased balance;Decreased knowledge of use of DME;Decreased mobility  PT Treatment Interventions DME instruction;Gait training;Stair training;Functional mobility training;Therapeutic activities;Balance training;Therapeutic exercise;Patient/family education   PT Goals (Current goals can be found  in the Care Plan section) Acute Rehab PT Goals Patient Stated Goal: to get strength back PT Goal Formulation: With patient/family Time For Goal Achievement: 06/07/14 Potential to Achieve Goals: Good    Frequency Min 3X/week   Barriers to discharge        Co-evaluation               End of Session Equipment Utilized During Treatment: Gait belt Activity Tolerance: Patient tolerated treatment well Patient left: in bed;with call bell/phone within reach;with family/visitor  present Nurse Communication: Mobility status         Time: 1340-1400 PT Time Calculation (min): 20 min   Charges:   PT Evaluation $Initial PT Evaluation Tier I: 1 Procedure PT Treatments $Gait Training: 8-22 mins   PT G Codes:          Philomena Doheny 05/24/2014, 2:02 PM [phone]

## 2014-05-24 NOTE — Plan of Care (Signed)
Problem: Phase I Progression Outcomes Goal: Pain controlled with appropriate interventions Outcome: Not Applicable Date Met:  03/00/92 Pt has been denying pain

## 2014-05-24 NOTE — Progress Notes (Signed)
[facility] PROGRESS NOTE  [name] [name] [id number] DOB: 05-06-1941 DOA: 05/17/2014 PCP: Gwendolyn Grant, MD  Assessment/Plan: 1. Hypertensive emergency - Continue with hydralazine injection 10 mg IV PRN SBP > 180 or DBP > 110 - If blood pressures remain consistently below 180 will plan on transitioning to floor. - Pt is on amlodipine, coreg (dose increased today), clonidine, and imdur.  Spironolactone recently added - She reports side effect of nightmares and hallucination with hydralazine.  I have indicated that while in the hospital I will give her hydralazine only if her systolic blood pressure diastolic blood pressure are above-stated values despite oral antihypertensive medication - Will work up for secondary causes specifically for hyperaldosteronism. (Aldosterone and renin plasma levels with ratio ordered) (currently in process) - discussed changing blood pressure cuff with nursing and proper techniques for measurement. Nursing will reassess blood pressure cuff.   2. Chest pain/and NSTEMI - Cardiology on board and managing - Patient is currently on aspirin, beta blocker, statin. No ACE inhibitor due to renal dysfunction. Anticoagulation held in setting of severe hypertension. - chest pain resolved.  3. Acute on chronic kidney failure - Most likely related to end organ damage from recent hypertensive emergency. - Will continue to monitor serum creatinine and adjust blood pressure medications - S creatinine stable at 4  4. diabetes mellitus - Continue sliding scale insulin  5. CAP - Currently on azithromycin and Rocephin. - Left lower lobe opacity reported on chest x-ray  6. Abdominal discomfort - moderate stool burden on abdominal x ray - Place order for dulcolax suppository and will have nursing ambulate patient more.  Code Status: full Family Communication: No family at bedside Disposition Plan: Tx to  telemetry   Consultants:  Cardiology   Antibiotics:  As listed above  HPI/Subjective: No new complaints. No acute issues reported overnight.  Objective: Filed Vitals:   05/24/14 0825  BP: 212/61  Pulse: 59  Temp:   Resp: 21    Intake/Output Summary (Last 24 hours) at 05/24/14 0942 Last data filed at 05/24/14 0800  Gross per 24 hour  Intake    653 ml  Output    825 ml  Net   -172 ml   Filed Weights   05/21/14 0514 05/23/14 0400 05/24/14 0548  Weight: 71.7 kg (158 lb 1.1 oz) 68.9 kg (151 lb 14.4 oz) 67.7 kg (149 lb 4 oz)    Exam:   General:  Patient in no acute distress, alert and awake  Cardiovascular: Regular rate and rhythm, no murmurs  Respiratory: No increased work of breathing, and no wheezes  Abdomen: Soft, nondistended, nontender  Musculoskeletal: No cyanosis or clubbing   Data Reviewed: Basic Metabolic Panel:  Recent Labs Lab 05/19/14 0354 05/20/14 0355 05/21/14 0333 05/23/14 0035 05/24/14 0401  NA 138 138 140 139 139  K 3.9 3.8 3.7 4.0 4.6  CL 103 105 105 103 104  CO2 21 20 21 20 22   GLUCOSE 85 121* 121* 83 131*  BUN 48* 53* 58* 60* 61*  CREATININE 3.99* 4.06* 4.04* 4.05* 4.09*  CALCIUM 8.2* 7.6* 8.1* 8.3* 8.5   Liver Function Tests: No results found for this basename: AST, ALT, ALKPHOS, BILITOT, PROT, ALBUMIN,  in the last 168 hours No results found for this basename: LIPASE, AMYLASE,  in the last 168 hours No results found for this basename: AMMONIA,  in the last 168 hours CBC:  Recent Labs Lab 05/19/14 0354 05/20/14 0355 05/21/14 0333 05/23/14 0035 05/24/14 0401  WBC 4.6 4.0  3.4* 3.9* 3.4*  HGB 9.2* 9.1* 9.7* 9.1* 8.8*  HCT 27.0* 26.0* 28.1* 26.8* 26.2*  MCV 85.7 88.1 85.7 87.9 88.2  PLT 106* 90* 102* 98* 90*   Cardiac Enzymes:  Recent Labs Lab 05/17/14 1435 05/17/14 1815 05/18/14 0005 05/18/14 0622  TROPONINI 3.30* 3.16* 4.60* 3.19*   BNP (last 3 results)  Recent Labs  05/17/14 1956  PROBNP 20847.0*    CBG:  Recent Labs Lab 05/23/14 0735 05/23/14 1221 05/23/14 1618 05/23/14 2145 05/24/14 0807  GLUCAP 101* 142* 165* 103* 122*    Recent Results (from the past 240 hour(s))  CLOSTRIDIUM DIFFICILE BY PCR     Status: None   Collection Time    05/17/14  4:01 PM      Result Value Ref Range Status   C difficile by pcr NEGATIVE  NEGATIVE Final   Comment: Performed at [place], BLOOD (ROUTINE X 2)     Status: None   Collection Time    05/17/14  4:57 PM      Result Value Ref Range Status   Specimen Description BLOOD LEFT FOREARM  4 ML IN [facility] BOTTLE   Final   Special Requests Normal   Final   Culture  Setup Time     Final   Value: 05/17/2014 21:15     Performed at [facility]   Culture     Final   Value: NO GROWTH 5 DAYS     Performed at [facility]   Report Status 05/23/2014 FINAL   Final  CULTURE, BLOOD (ROUTINE X 2)     Status: None   Collection Time    05/17/14  4:57 PM      Result Value Ref Range Status   Specimen Description BLOOD RIGHT FOREARM  4 ML IN [facility] BOTTLE   Final   Special Requests Normal   Final   Culture  Setup Time     Final   Value: 05/17/2014 21:15     Performed at [facility]   Culture     Final   Value: NO GROWTH 5 DAYS     Performed at [facility]   Report Status 05/23/2014 FINAL   Final  MRSA PCR SCREENING     Status: None   Collection Time    05/17/14  5:40 PM      Result Value Ref Range Status   MRSA by PCR NEGATIVE  NEGATIVE Final   Comment:            The GeneXpert MRSA Assay (FDA     approved for NASAL specimens     only), is one component of a     comprehensive MRSA colonization     surveillance program. It is not     intended to diagnose MRSA     infection nor to guide or     monitor treatment for     MRSA infections.     Studies: No results found.  Scheduled Meds: . amLODipine  10 mg Oral Daily  . antiseptic oral rinse  15 mL Mouth Rinse BID  . aspirin EC  325 mg Oral  Daily  . atorvastatin  40 mg Oral q1800  . azithromycin  500 mg Oral Daily  . carvedilol  25 mg Oral BID WC  . cefTRIAXone (ROCEPHIN)  IV  1 g Intravenous Q24H  . cloNIDine  0.3 mg Oral TID  . feeding supplement (GLUCERNA SHAKE)  237 mL Oral TID  BM  . heparin  5,000 Units Subcutaneous 3 times per day  . insulin aspart  0-15 Units Subcutaneous TID WC  . insulin aspart  0-5 Units Subcutaneous QHS  . isosorbide mononitrate  60 mg Oral Daily  . sodium chloride  3 mL Intravenous Q12H  . spironolactone  25 mg Oral Daily  . timolol  1 drop Right Eye q morning - 10a   Continuous Infusions:    Time spent:> 35 minutes    Velvet Bathe  [facility] Pager [phone] If 7PM-7AM, please contact night-coverage at [email or web address], password [facility] 05/24/2014, 9:42 AM  LOS: 7 days

## 2014-05-24 NOTE — Progress Notes (Signed)
Patient Name: [name] [name] Date of Encounter: 05/24/2014     Principal Problem:   Hypertensive emergency Active Problems:   Type II or unspecified type diabetes mellitus with renal manifestations, uncontrolled   HYPERLIPIDEMIA   Chronic kidney disease, stage IV (severe)   CAP (community acquired pneumonia)   Malignant hypertension   Chest pain   Elevated troponin   NSTEMI (non-ST elevated myocardial infarction)    SUBJECTIVE  She complains of constipation.  She states no bowel movement for a week. She states she was not out of bed yesterday. She denies chest pain or shortness of breath. Systolic blood pressures are gradually trending down.  CURRENT MEDS . amLODipine  10 mg Oral Daily  . antiseptic oral rinse  15 mL Mouth Rinse BID  . aspirin EC  325 mg Oral Daily  . atorvastatin  40 mg Oral q1800  . azithromycin  500 mg Oral Daily  . carvedilol  12.5 mg Oral BID WC  . cefTRIAXone (ROCEPHIN)  IV  1 g Intravenous Q24H  . cloNIDine  0.3 mg Oral TID  . feeding supplement (GLUCERNA SHAKE)  237 mL Oral TID BM  . heparin  5,000 Units Subcutaneous 3 times per day  . insulin aspart  0-15 Units Subcutaneous TID WC  . insulin aspart  0-5 Units Subcutaneous QHS  . isosorbide mononitrate  60 mg Oral Daily  . sodium chloride  3 mL Intravenous Q12H  . spironolactone  25 mg Oral Daily  . timolol  1 drop Right Eye q morning - 10a    OBJECTIVE  Filed Vitals:   05/24/14 0200 05/24/14 0400 05/24/14 0548 05/24/14 0600  BP: 190/161 145/59  180/58  Pulse: 63 62 63 126  Temp:   98.4 F (36.9 C)   TempSrc:   Oral   Resp: 12 19 17 15   Height:      Weight:   149 lb 4 oz (67.7 kg)   SpO2: 100% 99% 100% 100%    Intake/Output Summary (Last 24 hours) at 05/24/14 0800 Last data filed at 05/24/14 0149  Gross per 24 hour  Intake    653 ml  Output    600 ml  Net     53 ml   Filed Weights   05/21/14 0514 05/23/14 0400 05/24/14 0548  Weight: 158 lb 1.1 oz (71.7 kg) 151 lb 14.4 oz  (68.9 kg) 149 lb 4 oz (67.7 kg)    PHYSICAL EXAM  General: Pleasant, NAD. Neuro: Alert and oriented X 3. Moves all extremities spontaneously. Psych: Normal affect. HEENT:  Normal  Neck: Supple without bruits or JVD. Lungs:  Resp regular and unlabored, CTA. Heart: RRR no s3, s4, there is a soft systolic murmur at left sternal edge. Abdomen: Soft, non-tender, non-distended, BS + x 4.  Extremities: No clubbing, cyanosis or edema. DP/PT/Radials 2+ and equal bilaterally.  Accessory Clinical Findings  CBC  Recent Labs  05/23/14 0035 05/24/14 0401  WBC 3.9* 3.4*  HGB 9.1* 8.8*  HCT 26.8* 26.2*  MCV 87.9 88.2  PLT 98* 90*   Basic Metabolic Panel  Recent Labs  05/23/14 0035 05/24/14 0401  NA 139 139  K 4.0 4.6  CL 103 104  CO2 20 22  GLUCOSE 83 131*  BUN 60* 61*  CREATININE 4.05* 4.09*  CALCIUM 8.3* 8.5   Liver Function Tests No results found for this basename: AST, ALT, ALKPHOS, BILITOT, PROT, ALBUMIN,  in the last 72 hours No results found for this basename: LIPASE,  AMYLASE,  in the last 72 hours Cardiac Enzymes No results found for this basename: CKTOTAL, CKMB, CKMBINDEX, TROPONINI,  in the last 72 hours BNP No components found with this basename: POCBNP,  D-Dimer No results found for this basename: DDIMER,  in the last 72 hours Hemoglobin A1C No results found for this basename: HGBA1C,  in the last 72 hours Fasting Lipid Panel No results found for this basename: CHOL, HDL, LDLCALC, TRIG, CHOLHDL, LDLDIRECT,  in the last 72 hours Thyroid Function Tests No results found for this basename: TSH, T4TOTAL, FREET3, T3FREE, THYROIDAB,  in the last 72 hours  TELE  Normal sinus rhythm  ECG    Radiology/Studies  Dg Chest 2 View  05/17/2014   CLINICAL DATA:  Shortness of breath. Abdominal pain with nausea and vomiting. Current history of hypertension and diabetes.  EXAM: CHEST  2 VIEW  COMPARISON:  02/09/2007, 09/12/2006.  FINDINGS: AP semi-erect and lateral  images were obtained. Moderate cardiac enlargement, with significant interval increase in the heart size since 2008, even allowing for differences in technique. Pulmonary venous hypertension and likely minimal interstitial pulmonary edema. Airspace consolidation in the left lower lobe with silhouetting of the left hemidiaphragm. No confluent airspace consolidation elsewhere. Small bilateral pleural effusions. Degenerative changes involving the thoracic spine.  IMPRESSION: 1. Moderate cardiac enlargement with significant interval increase in heart size since 2008. 2. Pulmonary venous hypertension with perhaps minimal/incipient pulmonary edema. 3. Dense left lower lobe atelectasis versus pneumonia. Pneumonia is favored. 4. Small bilateral pleural effusions.   Electronically Signed   By: Evangeline Dakin M.D.   On: 05/17/2014 15:24   Dg Chest Port 1 View  05/18/2014   CLINICAL DATA:  Shortness of breath. Community acquired pneumonia. Chest pain. Myocardial infarct. Chronic kidney disease.  EXAM: CHEST - 1 VIEW  COMPARISON:  05/17/2014  FINDINGS: Persistent airspace opacity is seen in the left retrocardiac lung base with silhouetting of the left hemidiaphragm. This shows no significant change since previous study. Right lung remains clear. Heart size is stable.  IMPRESSION: No significant change in retrocardiac left lower lobe opacity, suspicious for pneumonia.   Electronically Signed   By: Earle Gell M.D.   On: 05/18/2014 09:27   Dg Abd Portable 2v  05/22/2014   CLINICAL DATA:  Abdominal distention.  No bowel movement for 4 days.  EXAM: PORTABLE ABDOMEN - 2 VIEW  COMPARISON:  None.  FINDINGS: There is a moderate stool burden in the RIGHT colon. Cholecystectomy clips are present in the right upper quadrant. No free air identified on decubitus view. Atherosclerosis. The bowel gas pattern is nonobstructive. There are no dilated loops of large or small bowel. No pathologic air-fluid levels.  IMPRESSION:  Nonobstructive bowel gas pattern. Moderate stool burden. No acute abnormality.   Electronically Signed   By: Dereck Ligas M.D.   On: 05/22/2014 07:07    ASSESSMENT AND PLAN 1. hypertensive emergency--blood pressure coming under control on current therapy of amlodipine, carvedilol, clonidine and spironolactone.  2. non-STEMI --peak trop 4.6 and trending down, EKG without specific ischemic changes. Occurred in setting of severe HTN and possible pneumonia as well  she is on ASA, beta blocker, statin. No ACE due to renal dysfunction. Anticoag held in setting of severe HTN.  Echocardiogram shows normal LV function and no wall motion abnormalities --> plan Non-invasive ischemic evaluation as outpatient as Cath would not be prudent @ this time with her poor renal function. 3. acute on chronic kidney disease stage IV--stable since starting spironolactone  Plan: Would suggest trying to mobilize her in terms of activity. Will increase carvedilol to 25 mg twice a day for additional blood pressure control.  Signed, Darlin Coco MD

## 2014-05-25 ENCOUNTER — Inpatient Hospital Stay (HOSPITAL_COMMUNITY): Payer: Medicare HMO

## 2014-05-25 LAB — GLUCOSE, CAPILLARY
Glucose-Capillary: 175 mg/dL — ABNORMAL HIGH (ref 70–99)
Glucose-Capillary: 123 mg/dL — ABNORMAL HIGH (ref 70–99)
Glucose-Capillary: 105 mg/dL — ABNORMAL HIGH (ref 70–99)
Glucose-Capillary: 130 mg/dL — ABNORMAL HIGH (ref 70–99)
Glucose-Capillary: 152 mg/dL — ABNORMAL HIGH (ref 70–99)

## 2014-05-25 LAB — RENAL FUNCTION PANEL
Albumin: 1.8 g/dL — ABNORMAL LOW (ref 3.5–5.2)
Anion gap: 14 (ref 5–15)
BUN: 61 mg/dL — AB (ref 6–23)
CO2: 22 mEq/L (ref 19–32)
CREATININE: 4.17 mg/dL — AB (ref 0.50–1.10)
Calcium: 8.7 mg/dL (ref 8.4–10.5)
Chloride: 106 mEq/L (ref 96–112)
GFR calc Af Amer: 11 mL/min — ABNORMAL LOW (ref >90)
GFR calc non Af Amer: 10 mL/min — ABNORMAL LOW (ref >90)
Glucose, Bld: 128 mg/dL — ABNORMAL HIGH (ref 70–99)
Phosphorus: 5.1 mg/dL — ABNORMAL HIGH (ref 2.3–4.6)
Potassium: 4.9 mEq/L (ref 3.7–5.3)
Sodium: 142 mEq/L (ref 137–147)

## 2014-05-25 MED ORDER — FUROSEMIDE 80 MG PO TABS
80.0000 mg | ORAL_TABLET | Freq: Two times a day (BID) | ORAL | Status: DC
  Administered 2014-05-25 – 2014-05-28 (×5): 80 mg via ORAL
  Filled 2014-05-25 (×11): qty 1

## 2014-05-25 MED ORDER — FUROSEMIDE 40 MG PO TABS
40.0000 mg | ORAL_TABLET | Freq: Every day | ORAL | Status: DC
  Administered 2014-05-25: 40 mg via ORAL
  Filled 2014-05-25: qty 1

## 2014-05-25 NOTE — Progress Notes (Signed)
[facility] PROGRESS NOTE  [name] [name] [id number] DOB: 1941-01-28 DOA: 05/17/2014 PCP: Gwendolyn Grant, MD  Assessment/Plan: 1. Hypertensive emergency - Pt is on amlodipine, coreg (dose increased today), clonidine, Spironolactone, and imdur  - She reports side effect of nightmares and hallucination with hydralazine.  - Will work up for secondary causes specifically for hyperaldosteronism. (Aldosterone and renin plasma levels with ratio ordered) (currently in process) - BP still elevated and HTN difficult to control. Will contact Nephrology to see if there are any other suggestions.  We may consider using Bidil which combines imdur and hydralazine  2. Chest pain/and NSTEMI - Cardiology on board and managing - Patient is currently on aspirin, beta blocker, statin. No ACE inhibitor due to renal dysfunction. Anticoagulation held in setting of severe hypertension. - chest pain resolved.  3. Acute on chronic kidney failure - Most likely related to end organ damage from recent hypertensive emergency. - Will continue to monitor serum creatinine and adjust blood pressure medications - S creatinine stable at 4 - Consulted Nephrology - Placed order for renal ultrasound and renal function panel (to be added on to blood draw already taken this am).  4. diabetes mellitus - Continue sliding scale insulin  5. CAP - Currently on azithromycin and Rocephin. - Left lower lobe opacity reported on chest x-ray  6. Abdominal discomfort - moderate stool burden on abdominal x ray - Place order for dulcolax suppository and will have nursing ambulate patient more.  Code Status: full Family Communication: No family at bedside Disposition Plan: Tx to telemetry   Consultants:  Cardiology   Antibiotics:  As listed above  HPI/Subjective: No acute issues reported overnight. No new complaints.   Objective: Filed Vitals:   05/25/14 0731  BP: 176/56  Pulse:   Temp:   Resp:      Intake/Output Summary (Last 24 hours) at 05/25/14 1044 Last data filed at 05/25/14 0400  Gross per 24 hour  Intake    480 ml  Output    725 ml  Net   -245 ml   Filed Weights   05/23/14 0400 05/24/14 0548 05/24/14 1113  Weight: 68.9 kg (151 lb 14.4 oz) 67.7 kg (149 lb 4 oz) 68.992 kg (152 lb 1.6 oz)    Exam:   General:  Patient in no acute distress, alert and awake  Cardiovascular: Regular rate and rhythm, no murmurs  Respiratory: No increased work of breathing, and no wheezes  Abdomen: Soft, nondistended, nontender  Musculoskeletal: No cyanosis or clubbing   Data Reviewed: Basic Metabolic Panel:  Recent Labs Lab 05/19/14 0354 05/20/14 0355 05/21/14 0333 05/23/14 0035 05/24/14 0401  NA 138 138 140 139 139  K 3.9 3.8 3.7 4.0 4.6  CL 103 105 105 103 104  CO2 21 20 21 20 22   GLUCOSE 85 121* 121* 83 131*  BUN 48* 53* 58* 60* 61*  CREATININE 3.99* 4.06* 4.04* 4.05* 4.09*  CALCIUM 8.2* 7.6* 8.1* 8.3* 8.5   Liver Function Tests: No results found for this basename: AST, ALT, ALKPHOS, BILITOT, PROT, ALBUMIN,  in the last 168 hours No results found for this basename: LIPASE, AMYLASE,  in the last 168 hours No results found for this basename: AMMONIA,  in the last 168 hours CBC:  Recent Labs Lab 05/19/14 0354 05/20/14 0355 05/21/14 0333 05/23/14 0035 05/24/14 0401  WBC 4.6 4.0 3.4* 3.9* 3.4*  HGB 9.2* 9.1* 9.7* 9.1* 8.8*  HCT 27.0* 26.0* 28.1* 26.8* 26.2*  MCV 85.7 88.1 85.7 87.9 88.2  PLT 106* 90* 102* 98* 90*   Cardiac Enzymes: No results found for this basename: CKTOTAL, CKMB, CKMBINDEX, TROPONINI,  in the last 168 hours BNP (last 3 results)  Recent Labs  05/17/14 1956  PROBNP 20847.0*   CBG:  Recent Labs Lab 05/24/14 0807 05/24/14 1137 05/24/14 1647 05/24/14 2156 05/25/14 0748  GLUCAP 122* 136* 99 175* 123*    Recent Results (from the past 240 hour(s))  CLOSTRIDIUM DIFFICILE BY PCR     Status: None   Collection Time    05/17/14   4:01 PM      Result Value Ref Range Status   C difficile by pcr NEGATIVE  NEGATIVE Final   Comment: Performed at [place], BLOOD (ROUTINE X 2)     Status: None   Collection Time    05/17/14  4:57 PM      Result Value Ref Range Status   Specimen Description BLOOD LEFT FOREARM  4 ML IN [facility] BOTTLE   Final   Special Requests Normal   Final   Culture  Setup Time     Final   Value: 05/17/2014 21:15     Performed at [facility]   Culture     Final   Value: NO GROWTH 5 DAYS     Performed at [facility]   Report Status 05/23/2014 FINAL   Final  CULTURE, BLOOD (ROUTINE X 2)     Status: None   Collection Time    05/17/14  4:57 PM      Result Value Ref Range Status   Specimen Description BLOOD RIGHT FOREARM  4 ML IN [facility] BOTTLE   Final   Special Requests Normal   Final   Culture  Setup Time     Final   Value: 05/17/2014 21:15     Performed at [facility]   Culture     Final   Value: NO GROWTH 5 DAYS     Performed at [facility]   Report Status 05/23/2014 FINAL   Final  MRSA PCR SCREENING     Status: None   Collection Time    05/17/14  5:40 PM      Result Value Ref Range Status   MRSA by PCR NEGATIVE  NEGATIVE Final   Comment:            The GeneXpert MRSA Assay (FDA     approved for NASAL specimens     only), is one component of a     comprehensive MRSA colonization     surveillance program. It is not     intended to diagnose MRSA     infection nor to guide or     monitor treatment for     MRSA infections.     Studies: No results found.  Scheduled Meds: . amLODipine  10 mg Oral Daily  . antiseptic oral rinse  15 mL Mouth Rinse BID  . aspirin EC  325 mg Oral Daily  . atorvastatin  40 mg Oral q1800  . carvedilol  25 mg Oral BID WC  . cloNIDine  0.3 mg Oral TID  . feeding supplement (GLUCERNA SHAKE)  237 mL Oral TID BM  . furosemide  40 mg Oral Daily  . heparin  5,000 Units Subcutaneous 3 times per day  . insulin  aspart  0-15 Units Subcutaneous TID WC  . insulin aspart  0-5 Units Subcutaneous QHS  . isosorbide mononitrate  60 mg Oral Daily  .  sodium chloride  3 mL Intravenous Q12H  . spironolactone  25 mg Oral Daily  . timolol  1 drop Right Eye q morning - 10a   Continuous Infusions:    Time spent:> 35 minutes    Velvet Bathe  [facility] Pager [phone] If 7PM-7AM, please contact night-coverage at [email or web address], password [facility] 05/25/2014, 10:44 AM  LOS: 8 days

## 2014-05-25 NOTE — Plan of Care (Signed)
Problem: Phase II Progression Outcomes Goal: Vital signs remain stable Outcome: Progressing B/p down from 190/68 to 138/63

## 2014-05-25 NOTE — Progress Notes (Signed)
Pt BP was elevated this am. 186/64. HR 68. Jonette Eva notified. PRN IV Labetalol dose given. Recheck BP- x2 and manually- 190/68. HR 68. M. Lynch notified. Another PRN IV Labetalol dose given. Pt resting comfortably in bed. Complains of no pain. Will continue to monitor BP closely. Blanchard Kelch, RN

## 2014-05-25 NOTE — Consult Note (Signed)
Renal Service Consult Note [facility]  [name] [name] 05/25/2014 Sol Blazing Requesting Physician: Dr Wendee Beavers  Reason for Consult:  CKD and HTN HPI: The patient is a 73 y.o. year-old with hx of HTN, DM since age 49 type I per chart and renal failure. She was admitted on 7/18 with nausea, vomiting and diarrhea.  Also CP, nonproductive cough, gen weakness, malaise and just feeling "ill".  She was also confused per family.  Also creat was up from 3.23 in March to 4.2.   Highest BP's in ED were 262/110 range. Hx of medication nonadherence. BP now is down to 140-190 / 55-65 over the last 24 hours. She was admitted and treated with IV NTG then Cardene and then transitioned to po meds. She is now on clonidine, norvasc, coreg and po lasix 40/d.  She has rec'd a couple doses of IV lasix since admission.  Patient has been seeing Dr Posey Pronto at [facility], not sure how long. Pt is confused and a vague historian, family provides most of history. A renal biopsy was scheduled in April this year but the BP was too high so it couldn't be done. She has had proteinuria dating back to 2008 with about 2 gm per day. Renal function deteriorated rapidly as below.  She has chronic hep B vs fatty liver per the chart.  A pheochromocytoma work-up was done in 2009 and was negative.    Today she remains mentally sluggish, is oriented OK   Date   R kidney (cm)  L kidney (cm)    Creat  eGFR Dec 2007  "normal"  "normal"  0.08 Sep 2007        0.05 Jan 2008           July 2009        0.07 Jun 2009  10.8   11.0   0.04 Feb 2010  12.7   11.02 May 2012  11.9   10.08 Aug 2012        1.31 Mar 2013        1.04 Sep 2013        1.05 Dec 2013        2.02 Jan 2014        2.90  15 Feb 10, 2014  renal biopsy  --   3.23  13 July 18- 26, 2015 9.1   9.5     3.99- 4.20 10      Past Medical History  Past Medical History  Diagnosis Date  . HYPERTENSION   . OSTEOPENIA   . Unspecified vitamin D deficiency   .  Headache(784.0)   . POSTMENOPAUSAL STATUS   . Hepatitis B carrier     05/2009: neg Hep C; Hep B: core pos, Surf neg; fatty liver US 8/10 - 7/13  . HYPERLIPIDEMIA   . GLAUCOMA   . GERD   . DIABETES MELLITUS, TYPE I   . DEPRESSION    Past Surgical History  Past Surgical History  Procedure Laterality Date  . Cholecystectomy  02/12/07  . Tonsillectomy    . Refractive surgery  07/29/09    Dr. Bing Plume   Family History  Family History  Problem Relation Age of Onset  . Coronary artery disease Other   . Diabetes Other   . Hypertension Other   . Arthritis Other    Social History  reports that she has never smoked. She does not  have any smokeless tobacco history on file. She reports that she does not drink alcohol or use illicit drugs. Allergies  Allergies  Allergen Reactions  . Hydralazine Itching  . Robaxin [Methocarbamol]     Dizziness    Home medications Prior to Admission medications   Medication Sig Start Date End Date Taking? Authorizing Provider  acetaminophen (TYLENOL) 500 MG tablet Take 500 mg by mouth every 6 (six) hours as needed for mild pain or headache.   Yes Historical Provider, MD  amLODipine (NORVASC) 10 MG tablet Take 1 tablet (10 mg total) by mouth daily. 11/05/13  Yes Rowe Clack, MD  Cholecalciferol (VITAMIN D3) 2000 UNITS CHEW Chew 1 tablet by mouth daily.   Yes Historical Provider, MD  furosemide (LASIX) 40 MG tablet Take 40 mg by mouth daily.   Yes Historical Provider, MD  hydrochlorothiazide (HYDRODIURIL) 25 MG tablet Take 1 tablet (25 mg total) by mouth daily. 11/05/13  Yes Rowe Clack, MD  lisinopril (PRINIVIL,ZESTRIL) 40 MG tablet Take 1 tablet (40 mg total) by mouth daily. 11/05/13  Yes Rowe Clack, MD  metoprolol succinate (TOPROL-XL) 100 MG 24 hr tablet TAKE 1 TABLET BY MOUTH EVERY DAY 03/19/14  Yes Rowe Clack, MD  timolol (BETIMOL) 0.5 % ophthalmic solution Place 1 drop into the right eye every morning.   Yes Historical Provider, MD    Liver Function Tests  Recent Labs Lab 05/25/14 1109  ALBUMIN 1.8*   No results found for this basename: LIPASE, AMYLASE,  in the last 168 hours CBC  Recent Labs Lab 05/21/14 0333 05/23/14 0035 05/24/14 0401  WBC 3.4* 3.9* 3.4*  HGB 9.7* 9.1* 8.8*  HCT 28.1* 26.8* 26.2*  MCV 85.7 87.9 88.2  PLT 102* 98* 90*   Basic Metabolic Panel  Recent Labs Lab 05/19/14 0354 05/20/14 0355 05/21/14 0333 05/23/14 0035 05/24/14 0401 05/25/14 1109  NA 138 138 140 139 139 142  K 3.9 3.8 3.7 4.0 4.6 4.9  CL 103 105 105 103 104 106  CO2 21 20 21 20 22 22   GLUCOSE 85 121* 121* 83 131* 128*  BUN 48* 53* 58* 60* 61* 61*  CREATININE 3.99* 4.06* 4.04* 4.05* 4.09* 4.17*  CALCIUM 8.2* 7.6* 8.1* 8.3* 8.5 8.7  PHOS  --   --   --   --   --  5.1*    Filed Vitals:   05/25/14 0731 05/25/14 1208 05/25/14 1217 05/25/14 1221  BP: 176/56 160/66 181/63 160/66  Pulse:      Temp:      TempSrc:      Resp:      Height:      Weight:      SpO2:       Exam Awake, slurred speech, sluggish reponses, a bit confused but Ox 3 No rash, cyanosis or gangrene Sclera anicteric, throat clear No jvd Chest bibasilar rales RRR no MRG Abd +ascites 2+, not tense No sig LE or UE edema, no LE ulcers or gangrene Neuro as above, no asterixis, nonfocal, gen weakness mild  UA - none  Assessment: 1 HTN'sive crisis- acute on chronic problem, BP is under good enough control right now. Would not alter meds.  2 CKD advanced stage- suspect that a lot of her symptoms are due to uremia (confusion, n/v, fatigue, slurred speech). I don't think the BP is causing any AMS and don't see any medications that could be implicated. PRN narcotics are ordered but she hasn't been getting any.  I think  that if she doesn't clear up in a day or two we should empirically place a tunneled HD cath and do a trial of HD to see if that improves her MS.  Her eGFR is down to 10 ml/min now from 13 ml/min in March and it's likely that she just  can't tolerate this degree of azotemia.   3 Volume excess- she is up 5 kg from admission, mild rales on exam, will get CXR 4 Ascites- she has ascites on exam and some kind of liver disease, hep B and/or fatty liver per chart 5 AMS- as above 6 DM 1 since age 60 7 Anemia   Plan- I stopped all prn narcotic meds and spironolactone, have increased po lasix to 80 bid. Check CXR, if wet will change to IV lasix.  Anticipate we may need trial of HD this admission.  Will follow. Avoid all narcotics or potentially sedating meds for now.   Kelly Splinter MD (pgr) [phone]    [phone] 05/25/2014, 2:08 PM

## 2014-05-25 NOTE — Progress Notes (Signed)
Patient Name: [name] [name] Date of Encounter: 05/25/2014     Principal Problem:   Hypertensive emergency Active Problems:   Type II or unspecified type diabetes mellitus with renal manifestations, uncontrolled   HYPERLIPIDEMIA   Chronic kidney disease, stage IV (severe)   CAP (community acquired pneumonia)   Malignant hypertension   Chest pain   Elevated troponin   NSTEMI (non-ST elevated myocardial infarction)    SUBJECTIVE  Systolic blood pressure still remaining high.  She required IV labetalol yesterday.  She is not having any chest pain or shortness of breath.  CURRENT MEDS . amLODipine  10 mg Oral Daily  . antiseptic oral rinse  15 mL Mouth Rinse BID  . aspirin EC  325 mg Oral Daily  . atorvastatin  40 mg Oral q1800  . carvedilol  25 mg Oral BID WC  . cloNIDine  0.3 mg Oral TID  . feeding supplement (GLUCERNA SHAKE)  237 mL Oral TID BM  . heparin  5,000 Units Subcutaneous 3 times per day  . insulin aspart  0-15 Units Subcutaneous TID WC  . insulin aspart  0-5 Units Subcutaneous QHS  . isosorbide mononitrate  60 mg Oral Daily  . sodium chloride  3 mL Intravenous Q12H  . spironolactone  25 mg Oral Daily  . timolol  1 drop Right Eye q morning - 10a    OBJECTIVE  Filed Vitals:   05/24/14 2150 05/25/14 0401 05/25/14 0612 05/25/14 0731  BP: 168/60 187/64 190/68 176/56  Pulse: 60 68    Temp: 97.5 F (36.4 C) 98.7 F (37.1 C)    TempSrc: Oral Oral    Resp: 20 20    Height:      Weight:      SpO2: 98% 100%      Intake/Output Summary (Last 24 hours) at 05/25/14 0835 Last data filed at 05/25/14 0400  Gross per 24 hour  Intake    480 ml  Output    725 ml  Net   -245 ml   Filed Weights   05/23/14 0400 05/24/14 0548 05/24/14 1113  Weight: 151 lb 14.4 oz (68.9 kg) 149 lb 4 oz (67.7 kg) 152 lb 1.6 oz (68.992 kg)    PHYSICAL EXAM  General: Pleasant, NAD. Neuro: Alert and oriented X 3. Moves all extremities spontaneously. Psych: Normal affect. HEENT:   Normal  Neck: Supple without bruits or JVD. Lungs:  Resp regular and unlabored, CTA. Heart: RRR no s3, s4, there is a grade 1/6 systolic ejection murmur at the left sternal edge. Abdomen: Soft, non-tender, non-distended, BS + x 4.  Extremities: No clubbing, cyanosis or edema. DP/PT/Radials 2+ and equal bilaterally.  Accessory Clinical Findings  CBC  Recent Labs  05/23/14 0035 05/24/14 0401  WBC 3.9* 3.4*  HGB 9.1* 8.8*  HCT 26.8* 26.2*  MCV 87.9 88.2  PLT 98* 90*   Basic Metabolic Panel  Recent Labs  05/23/14 0035 05/24/14 0401  NA 139 139  K 4.0 4.6  CL 103 104  CO2 20 22  GLUCOSE 83 131*  BUN 60* 61*  CREATININE 4.05* 4.09*  CALCIUM 8.3* 8.5   Liver Function Tests No results found for this basename: AST, ALT, ALKPHOS, BILITOT, PROT, ALBUMIN,  in the last 72 hours No results found for this basename: LIPASE, AMYLASE,  in the last 72 hours Cardiac Enzymes No results found for this basename: CKTOTAL, CKMB, CKMBINDEX, TROPONINI,  in the last 72 hours BNP No components found with this basename: POCBNP,  D-Dimer No results found for this basename: DDIMER,  in the last 72 hours Hemoglobin A1C No results found for this basename: HGBA1C,  in the last 72 hours Fasting Lipid Panel No results found for this basename: CHOL, HDL, LDLCALC, TRIG, CHOLHDL, LDLDIRECT,  in the last 72 hours Thyroid Function Tests No results found for this basename: TSH, T4TOTAL, FREET3, T3FREE, THYROIDAB,  in the last 72 hours  TELE  Normal sinus rhythm  ECG    Radiology/Studies  Dg Chest 2 View  05/17/2014   CLINICAL DATA:  Shortness of breath. Abdominal pain with nausea and vomiting. Current history of hypertension and diabetes.  EXAM: CHEST  2 VIEW  COMPARISON:  02/09/2007, 09/12/2006.  FINDINGS: AP semi-erect and lateral images were obtained. Moderate cardiac enlargement, with significant interval increase in the heart size since 2008, even allowing for differences in technique.  Pulmonary venous hypertension and likely minimal interstitial pulmonary edema. Airspace consolidation in the left lower lobe with silhouetting of the left hemidiaphragm. No confluent airspace consolidation elsewhere. Small bilateral pleural effusions. Degenerative changes involving the thoracic spine.  IMPRESSION: 1. Moderate cardiac enlargement with significant interval increase in heart size since 2008. 2. Pulmonary venous hypertension with perhaps minimal/incipient pulmonary edema. 3. Dense left lower lobe atelectasis versus pneumonia. Pneumonia is favored. 4. Small bilateral pleural effusions.   Electronically Signed   By: Evangeline Dakin M.D.   On: 05/17/2014 15:24   Dg Chest Port 1 View  05/18/2014   CLINICAL DATA:  Shortness of breath. Community acquired pneumonia. Chest pain. Myocardial infarct. Chronic kidney disease.  EXAM: CHEST - 1 VIEW  COMPARISON:  05/17/2014  FINDINGS: Persistent airspace opacity is seen in the left retrocardiac lung base with silhouetting of the left hemidiaphragm. This shows no significant change since previous study. Right lung remains clear. Heart size is stable.  IMPRESSION: No significant change in retrocardiac left lower lobe opacity, suspicious for pneumonia.   Electronically Signed   By: Earle Gell M.D.   On: 05/18/2014 09:27   Dg Abd Portable 2v  05/22/2014   CLINICAL DATA:  Abdominal distention.  No bowel movement for 4 days.  EXAM: PORTABLE ABDOMEN - 2 VIEW  COMPARISON:  None.  FINDINGS: There is a moderate stool burden in the RIGHT colon. Cholecystectomy clips are present in the right upper quadrant. No free air identified on decubitus view. Atherosclerosis. The bowel gas pattern is nonobstructive. There are no dilated loops of large or small bowel. No pathologic air-fluid levels.  IMPRESSION: Nonobstructive bowel gas pattern. Moderate stool burden. No acute abnormality.   Electronically Signed   By: Dereck Ligas M.D.   On: 05/22/2014 07:07    ASSESSMENT AND  PLAN  1. hypertensive emergency--blood pressure still not optimal on current therapy of amlodipine, carvedilol, clonidine and spironolactone.  2. non-STEMI --peak trop 4.6 and trending down, EKG without specific ischemic changes. Occurred in setting of severe HTN and possible pneumonia as well  she is on ASA, beta blocker, statin. No ACE due to renal dysfunction. Anticoag held in setting of severe HTN.  Echocardiogram shows normal LV function and no wall motion abnormalities --> plan Non-invasive ischemic evaluation as outpatient as Cath would not be prudent @ this time with her poor renal function. 3. acute on chronic kidney disease stage IV--stable since starting spironolactone  Plan: The patient was on furosemide at home.  Her weight has gone up since admission.  Her renal function is abnormal but stable.  We'll add furosemide 40 mg daily.  Consider requesting nephrology consult if blood pressure remains elevated.   Signed, Darlin Coco MD

## 2014-05-26 ENCOUNTER — Inpatient Hospital Stay (HOSPITAL_COMMUNITY): Payer: Medicare HMO

## 2014-05-26 LAB — GLUCOSE, CAPILLARY
GLUCOSE-CAPILLARY: 167 mg/dL — AB (ref 70–99)
Glucose-Capillary: 112 mg/dL — ABNORMAL HIGH (ref 70–99)
Glucose-Capillary: 149 mg/dL — ABNORMAL HIGH (ref 70–99)
Glucose-Capillary: 154 mg/dL — ABNORMAL HIGH (ref 70–99)
Glucose-Capillary: 107 mg/dL — ABNORMAL HIGH (ref 70–99)

## 2014-05-26 LAB — BASIC METABOLIC PANEL
Anion gap: 13 (ref 5–15)
BUN: 65 mg/dL — AB (ref 6–23)
CALCIUM: 8.7 mg/dL (ref 8.4–10.5)
CO2: 24 mEq/L (ref 19–32)
Chloride: 104 mEq/L (ref 96–112)
Creatinine, Ser: 4.3 mg/dL — ABNORMAL HIGH (ref 0.50–1.10)
GFR calc non Af Amer: 9 mL/min — ABNORMAL LOW (ref >90)
GFR, EST AFRICAN AMERICAN: 11 mL/min — AB (ref >90)
Glucose, Bld: 106 mg/dL — ABNORMAL HIGH (ref 70–99)
POTASSIUM: 4.9 meq/L (ref 3.7–5.3)
Sodium: 141 mEq/L (ref 137–147)

## 2014-05-26 LAB — ALT: ALT: 40 U/L — ABNORMAL HIGH (ref 0–35)

## 2014-05-26 LAB — CBC
HEMATOCRIT: 25.8 % — AB (ref 36.0–46.0)
Hemoglobin: 8.6 g/dL — ABNORMAL LOW (ref 12.0–15.0)
MCH: 30 pg (ref 26.0–34.0)
MCHC: 33.3 g/dL (ref 30.0–36.0)
MCV: 89.9 fL (ref 78.0–100.0)
Platelets: 95 10*3/uL — ABNORMAL LOW (ref 150–400)
RBC: 2.87 MIL/uL — ABNORMAL LOW (ref 3.87–5.11)
RDW: 15.3 % (ref 11.5–15.5)
WBC: 3.5 10*3/uL — AB (ref 4.0–10.5)

## 2014-05-26 MED ORDER — CEFUROXIME SODIUM 1.5 G IJ SOLR
1.5000 g | INTRAMUSCULAR | Status: AC
  Filled 2014-05-26: qty 1.5

## 2014-05-26 MED ORDER — BOOST / RESOURCE BREEZE PO LIQD
1.0000 | Freq: Two times a day (BID) | ORAL | Status: DC
  Administered 2014-05-27 – 2014-05-30 (×4): 1 via ORAL

## 2014-05-26 MED ORDER — NEPRO/CARBSTEADY PO LIQD: 237.0000 mL | Freq: Two times a day (BID) | ORAL | Status: DC

## 2014-05-26 NOTE — Progress Notes (Signed)
Report given to Ria Comment, Therapist, sports at [facility].

## 2014-05-26 NOTE — Progress Notes (Signed)
[facility] PROGRESS NOTE  [name] [name] [id number] DOB: 1941/06/13 DOA: 05/17/2014 PCP: Gwendolyn Grant, MD Brief Narrative: 73 y/o female with h/o CKD, DM, HTN who presented with malignant HTN. Had NSTEMI and acute on chronic Renal failure. Cardiology was consulted for NSTEMI. Antihypertensive medication has been continually adjusted but BP's still elevated as such Nephrology consulted. Currently considering HD while in house.  Assessment/Plan: 1. Hypertensive emergency - Pt is on amlodipine, coreg (dose increased today), clonidine, and imdur. Spironolactone discontinued as well as narcotic medication. - Will work up for secondary causes specifically for hyperaldosteronism. (Aldosterone and renin plasma levels with ratio ordered) (currently in process) - BP still elevated and HTN difficult to control. As such consulted Nephrology  2. Chest pain/and NSTEMI - Cardiology on board recommending further work up which can be done as outpatient.  Plan is to continue ASA, B blocker, statin.  Anticoagulation held in setting of severe HTN. - Patient is currently on aspirin, beta blocker, statin. No ACE inhibitor due to renal dysfunction. Anticoagulation held in setting of severe hypertension. - chest pain resolved.  3. Acute on chronic kidney failure - Most likely related to end organ damage from recent hypertensive emergency. - Will continue to monitor serum creatinine and adjust blood pressure medications - S creatinine stable at 4 - Consulted Nephrology and currently team considering HD while in house.  4. diabetes mellitus - Continue sliding scale insulin  5. CAP - Pt received appropriate antibiotic course while in house. - Left lower lobe opacity reported on chest x-ray - resolving.  6. Abdominal discomfort - resolving. And most likely due to constipation.  Code Status: full Family Communication: No family at bedside Disposition Plan: Per  nephrology   Consultants:  Cardiology  Nephrology   Antibiotics:  As listed above  HPI/Subjective: No acute issues reported overnight. No new complaints.   Objective: Filed Vitals:   05/26/14 1408  BP: 171/64  Pulse:   Temp:   Resp:     Intake/Output Summary (Last 24 hours) at 05/26/14 1410 Last data filed at 05/26/14 1216  Gross per 24 hour  Intake    480 ml  Output   1550 ml  Net  -1070 ml   Filed Weights   05/23/14 0400 05/24/14 0548 05/24/14 1113  Weight: 68.9 kg (151 lb 14.4 oz) 67.7 kg (149 lb 4 oz) 68.992 kg (152 lb 1.6 oz)    Exam:   General:  Patient in no acute distress, alert and awake  Cardiovascular: Regular rate and rhythm, no murmurs  Respiratory: No increased work of breathing, and no wheezes  Abdomen: Soft, nondistended, nontender  Musculoskeletal: No cyanosis or clubbing   Data Reviewed: Basic Metabolic Panel:  Recent Labs Lab 05/21/14 0333 05/23/14 0035 05/24/14 0401 05/25/14 1109 05/26/14 0400  NA 140 139 139 142 141  K 3.7 4.0 4.6 4.9 4.9  CL 105 103 104 106 104  CO2 21 20 22 22 24   GLUCOSE 121* 83 131* 128* 106*  BUN 58* 60* 61* 61* 65*  CREATININE 4.04* 4.05* 4.09* 4.17* 4.30*  CALCIUM 8.1* 8.3* 8.5 8.7 8.7  PHOS  --   --   --  5.1*  --    Liver Function Tests:  Recent Labs Lab 05/25/14 1109  ALBUMIN 1.8*   No results found for this basename: LIPASE, AMYLASE,  in the last 168 hours No results found for this basename: AMMONIA,  in the last 168 hours CBC:  Recent Labs Lab 05/20/14 0355 05/21/14 0333 05/23/14  [id number] 05/24/14 0401 05/26/14 0403  WBC 4.0 3.4* 3.9* 3.4* 3.5*  HGB 9.1* 9.7* 9.1* 8.8* 8.6*  HCT 26.0* 28.1* 26.8* 26.2* 25.8*  MCV 88.1 85.7 87.9 88.2 89.9  PLT 90* 102* 98* 90* 95*   Cardiac Enzymes: No results found for this basename: CKTOTAL, CKMB, CKMBINDEX, TROPONINI,  in the last 168 hours BNP (last 3 results)  Recent Labs  05/17/14 1956  PROBNP 20847.0*   CBG:  Recent Labs Lab  05/25/14 1208 05/25/14 1658 05/25/14 2233 05/26/14 0742 05/26/14 1117  GLUCAP 105* 130* 152* 112* 149*    Recent Results (from the past 240 hour(s))  CLOSTRIDIUM DIFFICILE BY PCR     Status: None   Collection Time    05/17/14  4:01 PM      Result Value Ref Range Status   C difficile by pcr NEGATIVE  NEGATIVE Final   Comment: Performed at [place], BLOOD (ROUTINE X 2)     Status: None   Collection Time    05/17/14  4:57 PM      Result Value Ref Range Status   Specimen Description BLOOD LEFT FOREARM  4 ML IN [facility] BOTTLE   Final   Special Requests Normal   Final   Culture  Setup Time     Final   Value: 05/17/2014 21:15     Performed at [facility]   Culture     Final   Value: NO GROWTH 5 DAYS     Performed at [facility]   Report Status 05/23/2014 FINAL   Final  CULTURE, BLOOD (ROUTINE X 2)     Status: None   Collection Time    05/17/14  4:57 PM      Result Value Ref Range Status   Specimen Description BLOOD RIGHT FOREARM  4 ML IN [facility] BOTTLE   Final   Special Requests Normal   Final   Culture  Setup Time     Final   Value: 05/17/2014 21:15     Performed at [facility]   Culture     Final   Value: NO GROWTH 5 DAYS     Performed at [facility]   Report Status 05/23/2014 FINAL   Final  MRSA PCR SCREENING     Status: None   Collection Time    05/17/14  5:40 PM      Result Value Ref Range Status   MRSA by PCR NEGATIVE  NEGATIVE Final   Comment:            The GeneXpert MRSA Assay (FDA     approved for NASAL specimens     only), is one component of a     comprehensive MRSA colonization     surveillance program. It is not     intended to diagnose MRSA     infection nor to guide or     monitor treatment for     MRSA infections.     Studies: US Renal  05/25/2014   CLINICAL DATA:  Renal failure  EXAM: RENAL ULTRASOUND  COMPARISON:  May 02, 2012  FINDINGS: Right Kidney:  Length: 9.1 cm. Right kidney is echogenic.  The right renal cortical thickness is low normal. No mass, perinephric fluid, or hydronephrosis visualized. No sonographically demonstrable calculus or ureterectasis.  Left Kidney:  Length: 9.5 cm. The left kidney is echogenic. Left renal cortical thickness is within normal limits. No mass, perinephric fluid, or hydronephrosis visualized. No sonographically  demonstrable calculus or ureterectasis.  Bladder:  Decompressed and cannot be assessed.  There is mild ascites.  There is a right pleural effusion.  IMPRESSION: Kidneys are echogenic consistent with medical renal disease. No obstructing foci identified. Mild ascites and right pleural effusion noted incidentally.   Electronically Signed   By: Lowella Grip M.D.   On: 05/25/2014 13:06    Scheduled Meds: . amLODipine  10 mg Oral Daily  . antiseptic oral rinse  15 mL Mouth Rinse BID  . aspirin EC  325 mg Oral Daily  . atorvastatin  40 mg Oral q1800  . carvedilol  25 mg Oral BID WC  . cloNIDine  0.3 mg Oral TID  . feeding supplement (GLUCERNA SHAKE)  237 mL Oral TID BM  . furosemide  80 mg Oral BID  . heparin  5,000 Units Subcutaneous 3 times per day  . insulin aspart  0-15 Units Subcutaneous TID WC  . insulin aspart  0-5 Units Subcutaneous QHS  . isosorbide mononitrate  60 mg Oral Daily  . sodium chloride  3 mL Intravenous Q12H  . timolol  1 drop Right Eye q morning - 10a   Continuous Infusions:    Time spent:> 35 minutes    Velvet Bathe  [facility] Pager [phone] If 7PM-7AM, please contact night-coverage at [email or web address], password [facility] 05/26/2014, 2:10 PM  LOS: 9 days

## 2014-05-26 NOTE — Progress Notes (Signed)
Patient ID: [name] [name], female   DOB: Aug 27, 1941, 73 y.o.   MRN: [id number] Date of Encounter: 05/26/2014   Principal Problem:   Hypertensive emergency Active Problems:   Type II or unspecified type diabetes mellitus with renal manifestations, uncontrolled   Malignant hypertension   Elevated troponin   NSTEMI (non-ST elevated myocardial infarction)   HYPERLIPIDEMIA   Chronic kidney disease, stage IV (severe)   CAP (community acquired pneumonia)   Chest pain    SUBJECTIVE Still notes constipation.  She states no bowel movement for a week. She states she was not out of bed yesterday. NO chest pain or shortness of breath. Systolic blood pressures are gradually trending down but still high  CURRENT MEDS . amLODipine  10 mg Oral Daily  . antiseptic oral rinse  15 mL Mouth Rinse BID  . aspirin EC  325 mg Oral Daily  . atorvastatin  40 mg Oral q1800  . carvedilol  25 mg Oral BID WC  . cloNIDine  0.3 mg Oral TID  . feeding supplement (GLUCERNA SHAKE)  237 mL Oral TID BM  . furosemide  80 mg Oral BID  . heparin  5,000 Units Subcutaneous 3 times per day  . insulin aspart  0-15 Units Subcutaneous TID WC  . insulin aspart  0-5 Units Subcutaneous QHS  . isosorbide mononitrate  60 mg Oral Daily  . sodium chloride  3 mL Intravenous Q12H  . timolol  1 drop Right Eye q morning - 10a    OBJECTIVE  Filed Vitals:   05/25/14 1458 05/25/14 2235 05/25/14 2300 05/26/14 0553  BP: 142/58 168/57 163/57 178/59  Pulse: 68 63  61  Temp: 98.6 F (37 C) 97.9 F (36.6 C)  97.5 F (36.4 C)  TempSrc: Oral Oral  Oral  Resp: 18 20  20   Height:      Weight:      SpO2: 99% 99%  99%    Intake/Output Summary (Last 24 hours) at 05/26/14 0711 Last data filed at 05/25/14 2351  Gross per 24 hour  Intake    480 ml  Output   1425 ml  Net   -945 ml   Filed Weights   05/23/14 0400 05/24/14 0548 05/24/14 1113  Weight: 151 lb 14.4 oz (68.9 kg) 149 lb 4 oz (67.7 kg) 152 lb 1.6 oz (68.992 kg)     PHYSICAL EXAM General: Pleasant, NAD. Neuro: Alert and oriented X 3. Moves all extremities spontaneously. Neck: Supple without bruits or JVD. Lungs:  Resp regular and unlabored, CTA. Heart: RRR no s3, +s4, there is a soft systolic murmur at left sternal edge. Abdomen: Soft, non-tender, non-distended, BS + x 4.  Extremities: No clubbing, cyanosis or edema. DP/PT/Radials 2+ and equal bilaterally.  Accessory Clinical Findings  CBC  Recent Labs  05/24/14 0401 05/26/14 0403  WBC 3.4* 3.5*  HGB 8.8* 8.6*  HCT 26.2* 25.8*  MCV 88.2 89.9  PLT 90* 95*   Basic Metabolic Panel  Recent Labs  05/24/14 0401 05/25/14 1109  NA 139 142  K 4.6 4.9  CL 104 106  CO2 22 22  GLUCOSE 131* 128*  BUN 61* 61*  CREATININE 4.09* 4.17*  CALCIUM 8.5 8.7  PHOS  --  5.1*   Liver Function Tests  Recent Labs  05/25/14 1109  ALBUMIN 1.8*   TELE: Normal sinus rhythm  ECG - no new Radiology/Studies - no new  ASSESSMENT AND PLAN 1. hypertensive emergency--blood pressure coming still with borderline control on current  therapy of amlodipine, carvedilol, clonidine and lasix.   Decent resonse to laxis. 2. non-STEMI (mostly likely Type 2/ no ACS MI)--peak trop 4.6 and trending down, EKG without specific ischemic changes. Occurred in setting of severe HTN and possible pneumonia as well  she is on ASA, beta blocker, statin. No ACE due to renal dysfunction. Anticoag held in setting of severe HTN.  Echocardiogram mostly normal --> plan Non-invasive ischemic evaluation as outpatient as Cath would not be prudent @ this time with her poor renal function. 3. acute on chronic kidney disease stage IV-- appreciate Nephrology input - lasix increased & spironolactone stopped. 4. Anemia -- ? Multifactorial.  No anginal Sx despite being anemic & hypertensive makes aggressive ischemic evaluation less of a priority.  Plan: agree with mobiliization & possible PT eval.  Per Nephrology - may require HD.  Will  defer BP control & diuresis to Nephrology as her renal function seems to be priority at this point.  Will need Non-invasive ischemic evaluation in the future, but in the absence of symptoms can wait until OP.Marland Kitchen  At this point, nothing further to add from Cardiac standpoint.  Will sign off until assistance is needed arranging f/u appt with Dr. Ashok Norris.  Signed, Leonie Man MD

## 2014-05-26 NOTE — Progress Notes (Addendum)
Nutrition Brief Note  Patient identified on the Malnutrition Screening Tool (MST) Report  Wt Readings from Last 15 Encounters:  05/24/14 152 lb 1.6 oz (68.992 kg)  01/08/14 147 lb (66.679 kg)  12/05/13 147 lb 12.8 oz (67.042 kg)  09/03/13 154 lb 6.4 oz (70.035 kg)  04/26/13 156 lb 12.8 oz (71.124 kg)  12/28/12 158 lb 1.9 oz (71.723 kg)  09/11/12 158 lb 12.8 oz (72.031 kg)  08/28/12 162 lb 6.4 oz (73.664 kg)  04/27/12 158 lb (71.668 kg)  12/23/11 161 lb 8 oz (73.256 kg)  08/26/11 156 lb 6.4 oz (70.943 kg)  04/22/11 156 lb (70.761 kg)  12/17/10 160 lb 12.8 oz (72.938 kg)  08/13/10 153 lb (69.4 kg)  04/13/10 156 lb (70.761 kg)    Body mass index is 24.56 kg/(m^2). Patient meets criteria for Normal weight based on current BMI.   Current diet order is Heart healthy, patient is consuming approximately 25-75% of meals at this time. Labs and medications reviewed.   7/20: Hx of type 2 DM, uncontrolled HTN, chronic hepatitis B, presented to ED morning of 7/18 with complaints of nausea/vomiting/diarrhea and chest pain. Found to have NSTEMI. Met with pt today who reports eating really good at home, 3 meals/day. Weight stable. Denies any vomiting or diarrhea today, and states she's been eating 50% of meals. Requests Boost Plus as she drinks this at home, will order.   7/27: Pt with improving appetite, noted to consume >50% or greater of meals. Has been consuming Glucerna shakes TID. Phos elevated, will modify to Resource supplement BID. Plan for possible temporary HD treatments, will modify to Nepro BID as warranted. Pt had questions regarding heart healthy diet. Provided pt with "Heart Healthy Nutrition Therapy" handout And "Heart Healthy Cooking Tips" handout from the Academy of [employer]  No additional nutrition interventions warranted at this time. If nutrition issues arise, please consult RD.   Atlee Abide MS RD LDN Clinical Dietitian [phone]

## 2014-05-26 NOTE — Consult Note (Signed)
Asked by Dr Jonnie Finner to place dialysis catheter in pt tomorrow.  She is being transferred to [facility].  Please make NPO p midnight.  Ruta Hinds, MD [facility] [place] Office: [phone] Pager: [phone]

## 2014-05-26 NOTE — Progress Notes (Signed)
  [name] Progress Note   Subjective: no real change, 1425 UOP w po lasix bid  Filed Vitals:   05/25/14 2300 05/26/14 0553 05/26/14 1407 05/26/14 1408  BP: 163/57 178/59 155/105 171/64  Pulse:  61 64   Temp:  97.5 F (36.4 C)    TempSrc:  Oral    Resp:  20 18   Height:      Weight:      SpO2:  99% 100%    Exam: Awake, slurred speech, still confused No jvd  Chest mild bibasilar rales  RRR no MRG  Abd ascites 2+, not tense , nontender abd No LE edema Neuro as above, no asterixis, nonfocal, gen weakness mild   UA - none   Assessment:  1 CKD stage V- pt has lost renal function rapidly over the past 8-10 months, she is now probably ESRD. She is here confused and not improving, not getting any medications that would explain this.  She is likely uremic. Plan initiation of dialysis, will need transfer to [facility] today. Have called [facility] for tunneled HD cath and then perm access prior to dc.  If compliance remains poor with HD she will not do well.  2 HTN BP's high, some vol excess w 5kg wt gain; very poor compliance with BP meds over the past years.  Uncontrolled HTN and DM are probably cause of ESRD.  4 Ascites- she has ascites on exam and some kind of liver disease, hep B and/or fatty liver per chart  5 AMS- as above  6 DM 1 since age 26  7 Anemia Hb 8.6, check Fe, consider esa  Plan- transfer to [facility], HD cath and start HD tomorrow, daily HD x 2-3 , start CLIP process for OP dialysis. Have d/w pt and family who are in agreement.     Kelly Splinter MD  pager [phone]    cell [phone]  05/26/2014, 3:20 PM     Recent Labs Lab 05/24/14 0401 05/25/14 1109 05/26/14 0400  NA 139 142 141  K 4.6 4.9 4.9  CL 104 106 104  CO2 22 22 24   GLUCOSE 131* 128* 106*  BUN 61* 61* 65*  CREATININE 4.09* 4.17* 4.30*  CALCIUM 8.5 8.7 8.7  PHOS  --  5.1*  --     Recent Labs Lab 05/25/14 1109  ALBUMIN 1.8*    Recent Labs Lab 05/23/14 0035 05/24/14 0401  05/26/14 0403  WBC 3.9* 3.4* 3.5*  HGB 9.1* 8.8* 8.6*  HCT 26.8* 26.2* 25.8*  MCV 87.9 88.2 89.9  PLT 98* 90* 95*   . amLODipine  10 mg Oral Daily  . antiseptic oral rinse  15 mL Mouth Rinse BID  . aspirin EC  325 mg Oral Daily  . atorvastatin  40 mg Oral q1800  . carvedilol  25 mg Oral BID WC  . cloNIDine  0.3 mg Oral TID  . [START ON 05/27/2014] feeding supplement (RESOURCE BREEZE)  1 Container Oral BID BM  . furosemide  80 mg Oral BID  . heparin  5,000 Units Subcutaneous 3 times per day  . insulin aspart  0-15 Units Subcutaneous TID WC  . insulin aspart  0-5 Units Subcutaneous QHS  . isosorbide mononitrate  60 mg Oral Daily  . sodium chloride  3 mL Intravenous Q12H  . timolol  1 drop Right Eye q morning - 10a     acetaminophen, acetaminophen, alum & mag hydroxide-simeth, diphenhydrAMINE, labetalol, ondansetron (ZOFRAN) IV, ondansetron, senna

## 2014-05-27 ENCOUNTER — Inpatient Hospital Stay (HOSPITAL_COMMUNITY): Payer: Medicare HMO | Admitting: Anesthesiology

## 2014-05-27 ENCOUNTER — Encounter (HOSPITAL_COMMUNITY): Payer: Medicare HMO | Admitting: Anesthesiology

## 2014-05-27 ENCOUNTER — Encounter (HOSPITAL_COMMUNITY): Payer: Self-pay | Admitting: Anesthesiology

## 2014-05-27 ENCOUNTER — Inpatient Hospital Stay (HOSPITAL_COMMUNITY): Payer: Medicare HMO

## 2014-05-27 ENCOUNTER — Encounter (HOSPITAL_COMMUNITY)
Admission: EM | Disposition: A | Discharge status: Home Health | Payer: Self-pay | Source: Home / Self Care | Attending: Family Medicine

## 2014-05-27 DIAGNOSIS — N185 Chronic kidney disease, stage 5: Secondary | ICD-10-CM

## 2014-05-27 HISTORY — PX: INSERTION OF DIALYSIS CATHETER: SHX1324

## 2014-05-27 LAB — CBC
HCT: 25.5 % — ABNORMAL LOW (ref 36.0–46.0)
Hemoglobin: 8.2 g/dL — ABNORMAL LOW (ref 12.0–15.0)
MCH: 29.2 pg (ref 26.0–34.0)
MCHC: 32.2 g/dL (ref 30.0–36.0)
MCV: 90.7 fL (ref 78.0–100.0)
PLATELETS: 109 10*3/uL — AB (ref 150–400)
RBC: 2.81 MIL/uL — AB (ref 3.87–5.11)
RDW: 15.2 % (ref 11.5–15.5)
WBC: 3.4 10*3/uL — ABNORMAL LOW (ref 4.0–10.5)

## 2014-05-27 LAB — RENAL FUNCTION PANEL
Albumin: 1.8 g/dL — ABNORMAL LOW (ref 3.5–5.2)
Anion gap: 14 (ref 5–15)
BUN: 66 mg/dL — ABNORMAL HIGH (ref 6–23)
CHLORIDE: 102 meq/L (ref 96–112)
CO2: 25 meq/L (ref 19–32)
CREATININE: 4.23 mg/dL — AB (ref 0.50–1.10)
Calcium: 8.4 mg/dL (ref 8.4–10.5)
GFR calc Af Amer: 11 mL/min — ABNORMAL LOW (ref >90)
GFR calc non Af Amer: 10 mL/min — ABNORMAL LOW (ref >90)
Glucose, Bld: 81 mg/dL (ref 70–99)
Phosphorus: 5 mg/dL — ABNORMAL HIGH (ref 2.3–4.6)
Potassium: 4.8 mEq/L (ref 3.7–5.3)
Sodium: 141 mEq/L (ref 137–147)

## 2014-05-27 LAB — GLUCOSE, CAPILLARY
GLUCOSE-CAPILLARY: 106 mg/dL — AB (ref 70–99)
Glucose-Capillary: 163 mg/dL — ABNORMAL HIGH (ref 70–99)
Glucose-Capillary: 91 mg/dL (ref 70–99)
Glucose-Capillary: 72 mg/dL (ref 70–99)

## 2014-05-27 LAB — BASIC METABOLIC PANEL
Anion gap: 13 (ref 5–15)
BUN: 65 mg/dL — ABNORMAL HIGH (ref 6–23)
CO2: 26 mEq/L (ref 19–32)
Calcium: 8.4 mg/dL (ref 8.4–10.5)
Chloride: 103 mEq/L (ref 96–112)
Creatinine, Ser: 4.27 mg/dL — ABNORMAL HIGH (ref 0.50–1.10)
GFR calc non Af Amer: 9 mL/min — ABNORMAL LOW (ref >90)
GFR, EST AFRICAN AMERICAN: 11 mL/min — AB (ref >90)
Glucose, Bld: 82 mg/dL (ref 70–99)
POTASSIUM: 4.8 meq/L (ref 3.7–5.3)
Sodium: 142 mEq/L (ref 137–147)

## 2014-05-27 LAB — IRON AND TIBC
IRON: 84 ug/dL (ref 42–135)
SATURATION RATIOS: 42 % (ref 20–55)
TIBC: 202 ug/dL — ABNORMAL LOW (ref 250–470)
UIBC: 118 ug/dL — AB (ref 125–400)

## 2014-05-27 LAB — POCT I-STAT 4, (NA,K, GLUC, HGB,HCT)
Glucose, Bld: 90 mg/dL (ref 70–99)
HCT: 26 % — ABNORMAL LOW (ref 36.0–46.0)
HEMOGLOBIN: 8.8 g/dL — AB (ref 12.0–15.0)
Potassium: 4.8 mEq/L (ref 3.7–5.3)
SODIUM: 137 meq/L (ref 137–147)

## 2014-05-27 LAB — HEPATITIS B SURFACE ANTIGEN: HEP B S AG: NEGATIVE

## 2014-05-27 LAB — HEPATITIS B SURFACE ANTIBODY,QUALITATIVE: HEP B S AB: POSITIVE — AB

## 2014-05-27 LAB — HEPATITIS B CORE ANTIBODY, IGM: Hep B C IgM: NONREACTIVE

## 2014-05-27 SURGERY — INSERTION OF DIALYSIS CATHETER
Anesthesia: Monitor Anesthesia Care | Site: Neck | Laterality: Right

## 2014-05-27 MED ORDER — MIDAZOLAM HCL 2 MG/2ML IJ SOLN
INTRAMUSCULAR | Status: AC
  Filled 2014-05-27: qty 2

## 2014-05-27 MED ORDER — SODIUM CHLORIDE 0.9 % IV SOLN
100.0000 mL | INTRAVENOUS | Status: DC | PRN
Start: 2014-05-27 — End: 2014-05-27

## 2014-05-27 MED ORDER — PENTAFLUOROPROP-TETRAFLUOROETH EX AERO
1.0000 | INHALATION_SPRAY | CUTANEOUS | Status: DC | PRN
Start: 2014-05-27 — End: 2014-05-27

## 2014-05-27 MED ORDER — HEPARIN SODIUM (PORCINE) 1000 UNIT/ML DIALYSIS: 20.0000 [IU]/kg | Freq: Once | INTRAMUSCULAR | Status: DC

## 2014-05-27 MED ORDER — PROPOFOL 10 MG/ML IV BOLUS
INTRAVENOUS | Status: DC | PRN
  Administered 2014-05-27: 20 mg via INTRAVENOUS
  Administered 2014-05-27 (×4): 10 mg via INTRAVENOUS

## 2014-05-27 MED ORDER — SENNOSIDES-DOCUSATE SODIUM 8.6-50 MG PO TABS
1.0000 | ORAL_TABLET | Freq: Two times a day (BID) | ORAL | Status: DC
  Administered 2014-05-29 – 2014-05-30 (×4): 1 via ORAL
  Filled 2014-05-27 (×10): qty 1

## 2014-05-27 MED ORDER — LIDOCAINE-PRILOCAINE 2.5-2.5 % EX CREA
1.0000 "application " | TOPICAL_CREAM | CUTANEOUS | Status: DC | PRN
  Filled 2014-05-27: qty 5

## 2014-05-27 MED ORDER — ONDANSETRON HCL 4 MG/2ML IJ SOLN: 4.0000 mg | Freq: Once | INTRAMUSCULAR | Status: DC | PRN

## 2014-05-27 MED ORDER — ACETAMINOPHEN 325 MG PO TABS: 325.0000 mg | ORAL_TABLET | ORAL | Status: DC | PRN

## 2014-05-27 MED ORDER — SODIUM CHLORIDE 0.9 % IV SOLN: 100.0000 mL | INTRAVENOUS | Status: DC | PRN

## 2014-05-27 MED ORDER — HEPARIN SODIUM (PORCINE) 1000 UNIT/ML DIALYSIS: 2000.0000 [IU] | INTRAMUSCULAR | Status: DC | PRN

## 2014-05-27 MED ORDER — SODIUM CHLORIDE 0.9 % IV SOLN
INTRAVENOUS | Status: DC | PRN
  Administered 2014-05-27: 10:00:00 via INTRAVENOUS

## 2014-05-27 MED ORDER — ALTEPLASE 2 MG IJ SOLR
2.0000 mg | Freq: Once | INTRAMUSCULAR | Status: DC | PRN
  Filled 2014-05-27: qty 2

## 2014-05-27 MED ORDER — FENTANYL CITRATE 0.05 MG/ML IJ SOLN
INTRAMUSCULAR | Status: AC
  Filled 2014-05-27: qty 5

## 2014-05-27 MED ORDER — FENTANYL CITRATE 0.05 MG/ML IJ SOLN: 25.0000 ug | INTRAMUSCULAR | Status: DC | PRN

## 2014-05-27 MED ORDER — ACETAMINOPHEN 160 MG/5ML PO SOLN
325.0000 mg | ORAL | Status: DC | PRN
  Filled 2014-05-27: qty 20.3

## 2014-05-27 MED ORDER — LIDOCAINE HCL (PF) 1 % IJ SOLN
5.0000 mL | INTRAMUSCULAR | Status: DC | PRN
Start: 2014-05-27 — End: 2014-05-27

## 2014-05-27 MED ORDER — HEPARIN SODIUM (PORCINE) 1000 UNIT/ML IJ SOLN
INTRAMUSCULAR | Status: DC | PRN
  Administered 2014-05-27: 5 [IU]

## 2014-05-27 MED ORDER — PENTAFLUOROPROP-TETRAFLUOROETH EX AERO: 1.0000 "application " | INHALATION_SPRAY | CUTANEOUS | Status: DC | PRN

## 2014-05-27 MED ORDER — LIDOCAINE HCL (PF) 1 % IJ SOLN: 5.0000 mL | INTRAMUSCULAR | Status: DC | PRN

## 2014-05-27 MED ORDER — FENTANYL CITRATE 0.05 MG/ML IJ SOLN
INTRAMUSCULAR | Status: DC | PRN
  Administered 2014-05-27: 50 ug via INTRAVENOUS

## 2014-05-27 MED ORDER — PROPOFOL 10 MG/ML IV BOLUS
INTRAVENOUS | Status: AC
  Filled 2014-05-27: qty 20

## 2014-05-27 MED ORDER — HEPARIN SODIUM (PORCINE) 1000 UNIT/ML DIALYSIS
1000.0000 [IU] | INTRAMUSCULAR | Status: DC | PRN
Start: 2014-05-27 — End: 2014-05-27

## 2014-05-27 MED ORDER — HEPARIN SODIUM (PORCINE) 1000 UNIT/ML IJ SOLN
INTRAMUSCULAR | Status: AC
  Filled 2014-05-27: qty 1

## 2014-05-27 MED ORDER — HEPARIN SODIUM (PORCINE) 1000 UNIT/ML DIALYSIS: 1000.0000 [IU] | INTRAMUSCULAR | Status: DC | PRN

## 2014-05-27 MED ORDER — LIDOCAINE-EPINEPHRINE (PF) 1 %-1:200000 IJ SOLN
INTRAMUSCULAR | Status: DC | PRN
  Administered 2014-05-27: 8 mL

## 2014-05-27 MED ORDER — OXYCODONE-ACETAMINOPHEN 5-325 MG PO TABS: 1.0000 | ORAL_TABLET | ORAL | Status: DC | PRN

## 2014-05-27 MED ORDER — NEPRO/CARBSTEADY PO LIQD
237.0000 mL | ORAL | Status: DC | PRN
Start: 2014-05-27 — End: 2014-05-27
  Filled 2014-05-27: qty 237

## 2014-05-27 MED ORDER — SODIUM CHLORIDE 0.9 % IR SOLN
Status: DC | PRN
  Administered 2014-05-27: 11:00:00

## 2014-05-27 MED ORDER — NEPRO/CARBSTEADY PO LIQD
237.0000 mL | ORAL | Status: DC | PRN
  Filled 2014-05-27: qty 237

## 2014-05-27 MED ORDER — ISOSORBIDE MONONITRATE ER 60 MG PO TB24
90.0000 mg | ORAL_TABLET | Freq: Every day | ORAL | Status: DC
  Administered 2014-05-29 – 2014-05-31 (×3): 90 mg via ORAL
  Filled 2014-05-27 (×4): qty 1

## 2014-05-27 SURGICAL SUPPLY — 40 items
BAG DECANTER FOR FLEXI CONT (MISCELLANEOUS) ×2 IMPLANT
BLADE 10 SAFETY STRL DISP (BLADE) ×2 IMPLANT
CATH CANNON HEMO 15F 50CM (CATHETERS) IMPLANT
CATH CANNON HEMO 15FR 19 (HEMODIALYSIS SUPPLIES) IMPLANT
CATH CANNON HEMO 15FR 23CM (HEMODIALYSIS SUPPLIES) ×2 IMPLANT
CATH CANNON HEMO 15FR 31CM (HEMODIALYSIS SUPPLIES) IMPLANT
CATH CANNON HEMO 15FR 32CM (HEMODIALYSIS SUPPLIES) IMPLANT
CHLORAPREP W/TINT 26ML (MISCELLANEOUS) IMPLANT
COVER PROBE W GEL 5X96 (DRAPES) ×2 IMPLANT
COVER SURGICAL LIGHT HANDLE (MISCELLANEOUS) ×2 IMPLANT
DERMABOND ADVANCED (GAUZE/BANDAGES/DRESSINGS) ×1
DERMABOND ADVANCED .7 DNX12 (GAUZE/BANDAGES/DRESSINGS) ×1 IMPLANT
DRAPE C-ARM 42X72 X-RAY (DRAPES) ×2 IMPLANT
DRAPE CHEST BREAST 15X10 FENES (DRAPES) ×2 IMPLANT
GAUZE SPONGE 2X2 8PLY STRL LF (GAUZE/BANDAGES/DRESSINGS) ×1 IMPLANT
GAUZE SPONGE 4X4 16PLY XRAY LF (GAUZE/BANDAGES/DRESSINGS) ×2 IMPLANT
GLOVE BIO SURGEON STRL SZ7.5 (GLOVE) ×2 IMPLANT
GLOVE BIOGEL PI IND STRL 8 (GLOVE) ×1 IMPLANT
GLOVE BIOGEL PI INDICATOR 8 (GLOVE) ×1
GOWN STRL REUS W/ TWL LRG LVL3 (GOWN DISPOSABLE) ×2 IMPLANT
GOWN STRL REUS W/TWL LRG LVL3 (GOWN DISPOSABLE) ×2
KIT BASIN OR (CUSTOM PROCEDURE TRAY) ×2 IMPLANT
KIT ROOM TURNOVER OR (KITS) ×2 IMPLANT
NEEDLE 18GX1X1/2 (RX/OR ONLY) (NEEDLE) ×2 IMPLANT
NEEDLE 22X1 1/2 (OR ONLY) (NEEDLE) ×2 IMPLANT
NEEDLE HYPO 25GX1X1/2 BEV (NEEDLE) ×2 IMPLANT
NS IRRIG 1000ML POUR BTL (IV SOLUTION) ×2 IMPLANT
PACK SURGICAL SETUP 50X90 (CUSTOM PROCEDURE TRAY) ×2 IMPLANT
PAD ARMBOARD 7.5X6 YLW CONV (MISCELLANEOUS) ×4 IMPLANT
SPONGE GAUZE 2X2 STER 10/PKG (GAUZE/BANDAGES/DRESSINGS) ×1
SUT ETHILON 3 0 PS 1 (SUTURE) ×2 IMPLANT
SUT VICRYL 4-0 PS2 18IN ABS (SUTURE) ×2 IMPLANT
SYR 20CC LL (SYRINGE) ×4 IMPLANT
SYR 5ML LL (SYRINGE) ×4 IMPLANT
SYR CONTROL 10ML LL (SYRINGE) ×2 IMPLANT
SYRINGE 10CC LL (SYRINGE) ×2 IMPLANT
TAPE CLOTH SURG 4X10 WHT LF (GAUZE/BANDAGES/DRESSINGS) ×2 IMPLANT
TOWEL OR 17X24 6PK STRL BLUE (TOWEL DISPOSABLE) ×2 IMPLANT
TOWEL OR 17X26 10 PK STRL BLUE (TOWEL DISPOSABLE) ×2 IMPLANT
WATER STERILE IRR 1000ML POUR (IV SOLUTION) ×2 IMPLANT

## 2014-05-27 NOTE — Anesthesia Postprocedure Evaluation (Signed)
  Anesthesia Post-op Note  Patient: [name] [name]  Procedure(s) Performed: Procedure(s): INSERTION OF DIALYSIS CATHETER (Right)  Patient Location: PACU  Anesthesia Type:MAC  Level of Consciousness: awake  Airway and Oxygen Therapy: Patient Spontanous Breathing  Post-op Pain: mild  Post-op Assessment: Post-op Vital signs reviewed, Patient's Cardiovascular Status Stable, Respiratory Function Stable, Patent Airway and No signs of Nausea or vomiting  Post-op Vital Signs: Reviewed and stable  Last Vitals:  Filed Vitals:   05/27/14 1551  BP: 179/77  Pulse: 64  Temp: 36.7 C  Resp: 15    Complications: No apparent anesthesia complications

## 2014-05-27 NOTE — Consult Note (Signed)
[facility] [place] HISTORY AND PHYSICAL   History of Present Illness:  Patient is a 73 y.o. year old female who presents for placement of a permanent hemodialysis access. The patient is right handed.  The patient is not currently on hemodialysis but starting today.  No prior access procedures.  Other chronic medical problems include hypertension, hyperlipidemia, diabetes which are currently controlled.  Past Medical History  Diagnosis Date  . HYPERTENSION   . OSTEOPENIA   . Unspecified vitamin D deficiency   . Headache(784.0)   . POSTMENOPAUSAL STATUS   . Hepatitis B carrier     05/2009: neg Hep C; Hep B: core pos, Surf neg; fatty liver US 8/10 - 7/13  . HYPERLIPIDEMIA   . GLAUCOMA   . GERD   . DIABETES MELLITUS, TYPE I   . DEPRESSION     Past Surgical History  Procedure Laterality Date  . Cholecystectomy  02/12/07  . Tonsillectomy    . Refractive surgery  07/29/09    Dr. Bing Plume     Social History History  Substance Use Topics  . Smoking status: Never Smoker   . Smokeless tobacco: Not on file     Comment: Married x's 38yrs, 6 kids-scattered [employer]. Retired Consulting civil engineer  . Alcohol Use: No    Family History Family History  Problem Relation Age of Onset  . Coronary artery disease Other   . Diabetes Other   . Hypertension Other   . Arthritis Other     Allergies  Allergies  Allergen Reactions  . Hydralazine Itching  . Robaxin [Methocarbamol]     Dizziness      Current Facility-Administered Medications  Medication Dose Route Frequency Provider Last Rate Last Dose  . acetaminophen (TYLENOL) tablet 650 mg  650 mg Oral Q6H PRN Kelvin Cellar, MD       Or  . acetaminophen (TYLENOL) suppository 650 mg  650 mg Rectal Q6H PRN Kelvin Cellar, MD      . alum & mag hydroxide-simeth (MAALOX/MYLANTA) 200-200-20 MG/5ML suspension 30 mL  30 mL Oral Q6H PRN Kelvin Cellar, MD      . amLODipine (NORVASC) tablet 10 mg  10 mg Oral Daily Velvet Bathe,  MD   10 mg at 05/26/14 1040  . antiseptic oral rinse (BIOTENE) solution 15 mL  15 mL Mouth Rinse BID Kelvin Cellar, MD   15 mL at 05/26/14 2000  . aspirin EC tablet 325 mg  325 mg Oral Daily Kelvin Cellar, MD   325 mg at 05/26/14 1040  . atorvastatin (LIPITOR) tablet 40 mg  40 mg Oral q1800 Kelvin Cellar, MD   40 mg at 05/26/14 2228  . carvedilol (COREG) tablet 25 mg  25 mg Oral BID WC Darlin Coco, MD   25 mg at 05/26/14 2230  . cefUROXime (ZINACEF) 1.5 g in dextrose 5 % 50 mL IVPB  1.5 g Intravenous On Call to [place], MD      . cloNIDine (CATAPRES) tablet 0.3 mg  0.3 mg Oral TID Kelvin Cellar, MD   0.3 mg at 05/26/14 2232  . diphenhydrAMINE (BENADRYL) capsule 25 mg  25 mg Oral Q4H PRN Darlin Coco, MD      . feeding supplement (RESOURCE BREEZE) (RESOURCE BREEZE) liquid 1 Container  1 Container Oral BID BM Hazle Coca, RD      . furosemide (LASIX) tablet 80 mg  80 mg Oral BID Sol Blazing, MD   80 mg at 05/26/14 2229  .  heparin injection 5,000 Units  5,000 Units Subcutaneous 3 times per day Kelvin Cellar, MD   5,000 Units at 05/26/14 1457  . insulin aspart (novoLOG) injection 0-15 Units  0-15 Units Subcutaneous TID WC Kelvin Cellar, MD   3 Units at 05/26/14 1953  . insulin aspart (novoLOG) injection 0-5 Units  0-5 Units Subcutaneous QHS Kelvin Cellar, MD   2 Units at 05/17/14 2126  . isosorbide mononitrate (IMDUR) 24 hr tablet 60 mg  60 mg Oral Daily Kelvin Cellar, MD   60 mg at 05/26/14 1040  . labetalol (NORMODYNE,TRANDATE) injection 10 mg  10 mg Intravenous Q2H PRN Velvet Bathe, MD   10 mg at 05/25/14 0626  . ondansetron (ZOFRAN) tablet 4 mg  4 mg Oral Q6H PRN Kelvin Cellar, MD       Or  . ondansetron (ZOFRAN) injection 4 mg  4 mg Intravenous Q6H PRN Kelvin Cellar, MD      . senna (SENOKOT) tablet 8.6 mg  1 tablet Oral Daily PRN Velvet Bathe, MD   8.6 mg at 05/22/14 1539  . sodium chloride 0.9 % injection 3 mL  3 mL Intravenous Q12H Kelvin Cellar, MD   3 mL at 05/26/14 2200  . timolol (TIMOPTIC) 0.5 % ophthalmic solution 1 drop  1 drop Right Eye q morning - 10a Kelvin Cellar, MD   1 drop at 05/26/14 1041    ROS:   General:  No weight loss, Fever, chills  HEENT: No recent headaches, no nasal bleeding, no visual changes, no sore throat  Neurologic: No dizziness, blackouts, seizures. No recent symptoms of stroke or mini- stroke. No recent episodes of slurred speech, or temporary blindness.  Cardiac: No recent episodes of chest pain/pressure, no shortness of breath at rest.  + shortness of breath with exertion.  Denies history of atrial fibrillation or irregular heartbeat  Vascular: No history of rest pain in feet.  No history of claudication.  No history of non-healing ulcer, No history of DVT   Pulmonary: No home oxygen, no productive cough, no hemoptysis,  No asthma or wheezing  Musculoskeletal:  [ ]  Arthritis, [ ]  Low back pain,  [ ]  Joint pain  Hematologic:No history of hypercoagulable state.  No history of easy bleeding.  No history of anemia  Gastrointestinal: No hematochezia or melena,  No gastroesophageal reflux, no trouble swallowing  Urinary: [x ] chronic Kidney disease, [ ]  on HD - [ ]  MWF or [ ]  TTHS, [ ]  Burning with urination, [ ]  Frequent urination, [ ]  Difficulty urinating;   Skin: No rashes  Psychological: No history of anxiety,  + history of depression   Physical Examination  Filed Vitals:   05/26/14 1408 05/26/14 1855 05/26/14 2121 05/27/14 0553  BP: 171/64 178/61 177/69 153/99  Pulse:  65 60 68  Temp:  98.2 F (36.8 C) 98.1 F (36.7 C) 98.3 F (36.8 C)  TempSrc:  Oral Oral Oral  Resp:  18 18 18   Height:      Weight:   149 lb 7.6 oz (67.8 kg)   SpO2:  97% 96% 98%    Body mass index is 24.14 kg/(m^2).  General:  Alert and oriented, no acute distress HEENT: Normal Neck: No JVD Pulmonary: Clear to auscultation bilaterally Cardiac: Regular Rate and Rhythm Gastrointestinal: Soft,  non-tender, non-distended, no mass Skin: No rash Extremity Pulses:  2+ radial, brachial pulses bilaterally, left wrist cephalic thrombosed on palpation right similar Musculoskeletal: No deformity or edema  Neurologic: Upper and lower extremity  motor 5/5 and symmetric  DATA: Vein map pending  BMET    Component Value Date/Time   NA 141 05/26/2014 0400   K 4.9 05/26/2014 0400   CL 104 05/26/2014 0400   CO2 24 05/26/2014 0400   GLUCOSE 106* 05/26/2014 0400   GLUCOSE 70 10/10/2006 1540   BUN 65* 05/26/2014 0400   CREATININE 4.30* 05/26/2014 0400   CALCIUM 8.7 05/26/2014 0400   GFRNONAA 9* 05/26/2014 0400   GFRAA 11* 05/26/2014 0400     CBC    Component Value Date/Time   WBC 3.5* 05/26/2014 0403   RBC 2.87* 05/26/2014 0403   HGB 8.6* 05/26/2014 0403   HCT 25.8* 05/26/2014 0403   PLT 95* 05/26/2014 0403   MCV 89.9 05/26/2014 0403   MCH 30.0 05/26/2014 0403   MCHC 33.3 05/26/2014 0403   RDW 15.3 05/26/2014 0403   LYMPHSABS 0.8 01/08/2014 0840   MONOABS 0.4 01/08/2014 0840   EOSABS 0.0 01/08/2014 0840   BASOSABS 0.0 01/08/2014 0840    ASSESSMENT: Needs hemodialysis access.     PLAN:  1Diatek catheter today +/- permanent access.  Dr Scot Dock to eval and place catheter today.  Vein map is pending so he will consider permanent access today versus defer until vein map.  Procedure details risks and benefits of catheter discussed with patient  Ruta Hinds, MD [facility] [place] Office: [phone] Pager: [phone]

## 2014-05-27 NOTE — H&P (View-Only) (Signed)
Asked by Dr Jonnie Finner to place dialysis catheter in pt tomorrow.  She is being transferred to [facility].  Please make NPO p midnight.  Ruta Hinds, MD [facility] [place] Office: [phone] Pager: [phone]

## 2014-05-27 NOTE — H&P (View-Only) (Signed)
[facility] [place] HISTORY AND PHYSICAL   History of Present Illness:  Patient is a 73 y.o. year old female who presents for placement of a permanent hemodialysis access. The patient is right handed.  The patient is not currently on hemodialysis but starting today.  No prior access procedures.  Other chronic medical problems include hypertension, hyperlipidemia, diabetes which are currently controlled.  Past Medical History  Diagnosis Date  . HYPERTENSION   . OSTEOPENIA   . Unspecified vitamin D deficiency   . Headache(784.0)   . POSTMENOPAUSAL STATUS   . Hepatitis B carrier     05/2009: neg Hep C; Hep B: core pos, Surf neg; fatty liver US 8/10 - 7/13  . HYPERLIPIDEMIA   . GLAUCOMA   . GERD   . DIABETES MELLITUS, TYPE I   . DEPRESSION     Past Surgical History  Procedure Laterality Date  . Cholecystectomy  02/12/07  . Tonsillectomy    . Refractive surgery  07/29/09    Dr. Bing Plume     Social History History  Substance Use Topics  . Smoking status: Never Smoker   . Smokeless tobacco: Not on file     Comment: Married x's 58yrs, 6 kids-scattered [employer]. Retired Consulting civil engineer  . Alcohol Use: No    Family History Family History  Problem Relation Age of Onset  . Coronary artery disease Other   . Diabetes Other   . Hypertension Other   . Arthritis Other     Allergies  Allergies  Allergen Reactions  . Hydralazine Itching  . Robaxin [Methocarbamol]     Dizziness      Current Facility-Administered Medications  Medication Dose Route Frequency Provider Last Rate Last Dose  . acetaminophen (TYLENOL) tablet 650 mg  650 mg Oral Q6H PRN Kelvin Cellar, MD       Or  . acetaminophen (TYLENOL) suppository 650 mg  650 mg Rectal Q6H PRN Kelvin Cellar, MD      . alum & mag hydroxide-simeth (MAALOX/MYLANTA) 200-200-20 MG/5ML suspension 30 mL  30 mL Oral Q6H PRN Kelvin Cellar, MD      . amLODipine (NORVASC) tablet 10 mg  10 mg Oral Daily Velvet Bathe,  MD   10 mg at 05/26/14 1040  . antiseptic oral rinse (BIOTENE) solution 15 mL  15 mL Mouth Rinse BID Kelvin Cellar, MD   15 mL at 05/26/14 2000  . aspirin EC tablet 325 mg  325 mg Oral Daily Kelvin Cellar, MD   325 mg at 05/26/14 1040  . atorvastatin (LIPITOR) tablet 40 mg  40 mg Oral q1800 Kelvin Cellar, MD   40 mg at 05/26/14 2228  . carvedilol (COREG) tablet 25 mg  25 mg Oral BID WC Darlin Coco, MD   25 mg at 05/26/14 2230  . cefUROXime (ZINACEF) 1.5 g in dextrose 5 % 50 mL IVPB  1.5 g Intravenous On Call to [place], MD      . cloNIDine (CATAPRES) tablet 0.3 mg  0.3 mg Oral TID Kelvin Cellar, MD   0.3 mg at 05/26/14 2232  . diphenhydrAMINE (BENADRYL) capsule 25 mg  25 mg Oral Q4H PRN Darlin Coco, MD      . feeding supplement (RESOURCE BREEZE) (RESOURCE BREEZE) liquid 1 Container  1 Container Oral BID BM Hazle Coca, RD      . furosemide (LASIX) tablet 80 mg  80 mg Oral BID Sol Blazing, MD   80 mg at 05/26/14 2229  .  heparin injection 5,000 Units  5,000 Units Subcutaneous 3 times per day Kelvin Cellar, MD   5,000 Units at 05/26/14 1457  . insulin aspart (novoLOG) injection 0-15 Units  0-15 Units Subcutaneous TID WC Kelvin Cellar, MD   3 Units at 05/26/14 1953  . insulin aspart (novoLOG) injection 0-5 Units  0-5 Units Subcutaneous QHS Kelvin Cellar, MD   2 Units at 05/17/14 2126  . isosorbide mononitrate (IMDUR) 24 hr tablet 60 mg  60 mg Oral Daily Kelvin Cellar, MD   60 mg at 05/26/14 1040  . labetalol (NORMODYNE,TRANDATE) injection 10 mg  10 mg Intravenous Q2H PRN Velvet Bathe, MD   10 mg at 05/25/14 0626  . ondansetron (ZOFRAN) tablet 4 mg  4 mg Oral Q6H PRN Kelvin Cellar, MD       Or  . ondansetron (ZOFRAN) injection 4 mg  4 mg Intravenous Q6H PRN Kelvin Cellar, MD      . senna (SENOKOT) tablet 8.6 mg  1 tablet Oral Daily PRN Velvet Bathe, MD   8.6 mg at 05/22/14 1539  . sodium chloride 0.9 % injection 3 mL  3 mL Intravenous Q12H Kelvin Cellar, MD   3 mL at 05/26/14 2200  . timolol (TIMOPTIC) 0.5 % ophthalmic solution 1 drop  1 drop Right Eye q morning - 10a Kelvin Cellar, MD   1 drop at 05/26/14 1041    ROS:   General:  No weight loss, Fever, chills  HEENT: No recent headaches, no nasal bleeding, no visual changes, no sore throat  Neurologic: No dizziness, blackouts, seizures. No recent symptoms of stroke or mini- stroke. No recent episodes of slurred speech, or temporary blindness.  Cardiac: No recent episodes of chest pain/pressure, no shortness of breath at rest.  + shortness of breath with exertion.  Denies history of atrial fibrillation or irregular heartbeat  Vascular: No history of rest pain in feet.  No history of claudication.  No history of non-healing ulcer, No history of DVT   Pulmonary: No home oxygen, no productive cough, no hemoptysis,  No asthma or wheezing  Musculoskeletal:  [ ]  Arthritis, [ ]  Low back pain,  [ ]  Joint pain  Hematologic:No history of hypercoagulable state.  No history of easy bleeding.  No history of anemia  Gastrointestinal: No hematochezia or melena,  No gastroesophageal reflux, no trouble swallowing  Urinary: [x ] chronic Kidney disease, [ ]  on HD - [ ]  MWF or [ ]  TTHS, [ ]  Burning with urination, [ ]  Frequent urination, [ ]  Difficulty urinating;   Skin: No rashes  Psychological: No history of anxiety,  + history of depression   Physical Examination  Filed Vitals:   05/26/14 1408 05/26/14 1855 05/26/14 2121 05/27/14 0553  BP: 171/64 178/61 177/69 153/99  Pulse:  65 60 68  Temp:  98.2 F (36.8 C) 98.1 F (36.7 C) 98.3 F (36.8 C)  TempSrc:  Oral Oral Oral  Resp:  18 18 18   Height:      Weight:   149 lb 7.6 oz (67.8 kg)   SpO2:  97% 96% 98%    Body mass index is 24.14 kg/(m^2).  General:  Alert and oriented, no acute distress HEENT: Normal Neck: No JVD Pulmonary: Clear to auscultation bilaterally Cardiac: Regular Rate and Rhythm Gastrointestinal: Soft,  non-tender, non-distended, no mass Skin: No rash Extremity Pulses:  2+ radial, brachial pulses bilaterally, left wrist cephalic thrombosed on palpation right similar Musculoskeletal: No deformity or edema  Neurologic: Upper and lower extremity  motor 5/5 and symmetric  DATA: Vein map pending  BMET    Component Value Date/Time   NA 141 05/26/2014 0400   K 4.9 05/26/2014 0400   CL 104 05/26/2014 0400   CO2 24 05/26/2014 0400   GLUCOSE 106* 05/26/2014 0400   GLUCOSE 70 10/10/2006 1540   BUN 65* 05/26/2014 0400   CREATININE 4.30* 05/26/2014 0400   CALCIUM 8.7 05/26/2014 0400   GFRNONAA 9* 05/26/2014 0400   GFRAA 11* 05/26/2014 0400     CBC    Component Value Date/Time   WBC 3.5* 05/26/2014 0403   RBC 2.87* 05/26/2014 0403   HGB 8.6* 05/26/2014 0403   HCT 25.8* 05/26/2014 0403   PLT 95* 05/26/2014 0403   MCV 89.9 05/26/2014 0403   MCH 30.0 05/26/2014 0403   MCHC 33.3 05/26/2014 0403   RDW 15.3 05/26/2014 0403   LYMPHSABS 0.8 01/08/2014 0840   MONOABS 0.4 01/08/2014 0840   EOSABS 0.0 01/08/2014 0840   BASOSABS 0.0 01/08/2014 0840    ASSESSMENT: Needs hemodialysis access.     PLAN:  1Diatek catheter today +/- permanent access.  Dr Scot Dock to eval and place catheter today.  Vein map is pending so he will consider permanent access today versus defer until vein map.  Procedure details risks and benefits of catheter discussed with patient  Ruta Hinds, MD [facility] [place] Office: [phone] Pager: [phone]

## 2014-05-27 NOTE — Interval H&P Note (Signed)
History and Physical Interval Note:  05/27/2014 10:03 AM  [name] [name]  has presented today for surgery, with the diagnosis of ESRD  The various methods of treatment have been discussed with the patient and family. After consideration of risks, benefits and other options for treatment, the patient has consented to  Procedure(s): INSERTION OF DIALYSIS CATHETER (N/A) as a surgical intervention .  The patient's history has been reviewed, patient examined, no change in status, stable for surgery.  I have reviewed the patient's chart and labs.  Questions were answered to the patient's satisfaction.     [name]

## 2014-05-27 NOTE — Progress Notes (Signed)
[facility] Progress Note   Subjective: getting tunneled HD cath today  Filed Vitals:   05/26/14 2121 05/27/14 0553 05/27/14 0917 05/27/14 1103  BP: 177/69 153/99 179/66   Pulse: 60 68    Temp: 98.1 F (36.7 C) 98.3 F (36.8 C)  97.5 F (36.4 C)  TempSrc: Oral Oral    Resp: 18 18    Height:      Weight: 67.8 kg (149 lb 7.6 oz)     SpO2: 96% 98%     Exam: Awake, slurred speech, no change No jvd  Chest mild bibasilar rales  RRR no MRG  Abd ascites 2+, not tense , nontender abd No LE edema Neuro gen weakness, confused  UA - none   Assessment:  1 CKD stage V- suspected uremia w AMS, fatigue, etc. For HD cath this am and first HD today 2 HTN - vol excess, high BP's, will decrease vol w HD and cont norvasc/coreg, wean off clonidine 4 Ascites- she has ascites on exam and hx of liver disease per chart due to fatty liver or hep B; hep B SAg was negative done yesterday, she is SAb positive c/w past infection/immunization and immunity 5 AMS- as above  6 DM 1 since age 37  7 Anemia Hb 8.6, check Fe, consider esa  Plan- HD later today and tomorrow, remove excess volume, start CLIP    Kelly Splinter MD  pager [phone]    cell [phone]  05/27/2014, 11:10 AM     Recent Labs Lab 05/24/14 0401 05/25/14 1109 05/26/14 0400 05/27/14 1014  NA 139 142 141 137  K 4.6 4.9 4.9 4.8  CL 104 106 104  --   CO2 22 22 24   --   GLUCOSE 131* 128* 106* 90  BUN 61* 61* 65*  --   CREATININE 4.09* 4.17* 4.30*  --   CALCIUM 8.5 8.7 8.7  --   PHOS  --  5.1*  --   --     Recent Labs Lab 05/25/14 1109 05/26/14 2102  ALT  --  40*  ALBUMIN 1.8*  --     Recent Labs Lab 05/23/14 0035 05/24/14 0401 05/26/14 0403 05/27/14 1014  WBC 3.9* 3.4* 3.5*  --   HGB 9.1* 8.8* 8.6* 8.8*  HCT 26.8* 26.2* 25.8* 26.0*  MCV 87.9 88.2 89.9  --   PLT 98* 90* 95*  --    . [MAR HOLD] amLODipine  10 mg Oral Daily  . [facility] HOLD] antiseptic oral rinse  15 mL Mouth Rinse BID  . [facility]  HOLD] aspirin EC  325 mg Oral Daily  . [facility] HOLD] atorvastatin  40 mg Oral q1800  . [facility] HOLD] carvedilol  25 mg Oral BID WC  . [MAR HOLD] cefUROXime (ZINACEF)  IV  1.5 g Intravenous On Call to OR  . [facility] HOLD] cloNIDine  0.3 mg Oral TID  . [MAR HOLD] feeding supplement (RESOURCE BREEZE)  1 Container Oral BID BM  . [facility] HOLD] furosemide  80 mg Oral BID  . [MAR HOLD] heparin  5,000 Units Subcutaneous 3 times per day  . [MAR HOLD] insulin aspart  0-15 Units Subcutaneous TID WC  . [MAR HOLD] insulin aspart  0-5 Units Subcutaneous QHS  . [facility] HOLD] isosorbide mononitrate  60 mg Oral Daily  . [MAR HOLD] sodium chloride  3 mL Intravenous Q12H  . [MAR HOLD] timolol  1 drop Right Eye q morning - 10a     [MAR HOLD] acetaminophen, [MAR HOLD]  acetaminophen, [MAR HOLD] alum & mag hydroxide-simeth, [MAR HOLD] diphenhydrAMINE, [MAR HOLD] labetalol, [MAR HOLD] ondansetron (ZOFRAN) IV, [MAR HOLD] ondansetron, [MAR HOLD] senna

## 2014-05-27 NOTE — Progress Notes (Signed)
Medicare IM copy given to pt. Jonnie Finner RN CCM Case Mgmt phone [phone]

## 2014-05-27 NOTE — Transfer of Care (Signed)
Immediate Anesthesia Transfer of Care Note  Patient: [name] [name]  Procedure(s) Performed: Procedure(s): INSERTION OF DIALYSIS CATHETER (Right)  Patient Location: PACU  Anesthesia Type:MAC  Level of Consciousness: awake, alert  and oriented  Airway & Oxygen Therapy: Patient Spontanous Breathing and Patient connected to nasal cannula oxygen  Post-op Assessment: Report given to PACU RN, Post -op Vital signs reviewed and stable and Patient moving all extremities X 4  Post vital signs: Reviewed and stable  Complications: No apparent anesthesia complications

## 2014-05-27 NOTE — Anesthesia Procedure Notes (Signed)
Procedure Name: MAC Date/Time: 05/27/2014 10:24 AM Performed by: Neldon Newport Pre-anesthesia Checklist: Patient identified, Emergency Drugs available, Suction available, Patient being monitored and Timeout performed Patient Re-evaluated:Patient Re-evaluated prior to inductionOxygen Delivery Method: Simple face mask Placement Confirmation: positive ETCO2 Dental Injury: Teeth and Oropharynx as per pre-operative assessment

## 2014-05-27 NOTE — Anesthesia Preprocedure Evaluation (Addendum)
Anesthesia Evaluation  Patient identified by MRN, date of birth, ID band Patient awake    Reviewed: Allergy & Precautions, H&P , NPO status , Patient's Chart, lab work & pertinent test results, reviewed documented beta blocker date and time   History of Anesthesia Complications Negative for: history of anesthetic complications  Airway Mallampati: III TM Distance: >3 FB Neck ROM: full    Dental  (+) Dental Advidsory Given, Teeth Intact   Pulmonary neg pulmonary ROS,          Cardiovascular hypertension, Pt. on medications and Pt. on home beta blockers + Past MI     Neuro/Psych  Headaches, PSYCHIATRIC DISORDERS Depression    GI/Hepatic GERD-  Medicated and Controlled,  Endo/Other  diabetes  Renal/GU ESRFRenal disease     Musculoskeletal negative musculoskeletal ROS (+)   Abdominal   Peds  Hematology   Anesthesia Other Findings   Reproductive/Obstetrics                         Anesthesia Physical Anesthesia Plan  ASA: III  Anesthesia Plan: MAC   Post-op Pain Management:    Induction:   Airway Management Planned:   Additional Equipment:   Intra-op Plan:   Post-operative Plan:   Informed Consent:   Dental Advisory Given  Plan Discussed with: Anesthesiologist  Anesthesia Plan Comments:        Anesthesia Quick Evaluation

## 2014-05-27 NOTE — Progress Notes (Signed)
05/27/2014 4:27 PM Hemodialysis Outpatient Note; the CLIP process has been initiated, [place] is not accepting patients at this time, [place] has agreed to take patient once a permanent access has been established. Tentatively scheduled to begin on Tuesday August 4th at 10:30 AM. Will follow up once I have more information. Thank you. Gordy Savers

## 2014-05-27 NOTE — Op Note (Signed)
05/27/2014  PREOP DIAGNOSIS: Chronic kidney disease  POSTOP DIAGNOSIS: Chronic kidney disease  PROCEDURE: Ultrasound guided placement of right IJ Diatek catheter  SURGEON: Judeth Cornfield. Scot Dock, MD, FACS  ASSIST: none  ANESTHESIA: local with sedation   EBL: minimal  FINDINGS: patent right  IJ  INDICATIONS: New HD access  TECHNIQUE: The patient was taken to the operating room and sedated by anesthesia. The neck and upper chest were prepped and draped in the usual sterile fashion. After the skin was anesthetized with 1% lidocaine, and under ultrasound guidance, the right IJ was cannulated and a guidewire introduced into the superior vena cava under fluoroscopic control. The tract over the wire was dilated and then the dilator and peel-away sheath were passed over the wire and the wire and dilator removed. The catheter was passed through the peel-away sheath and positioned in the right atrium. The exit site for the catheter was selected and the skin anesthetized between the 2 areas. The catheter was then brought through the tunnel, cut to the appropriate length, and the distal ports were attached. Both ports withdrew easily, were then flushed with heparinized saline and filled with concentrated heparin. The catheter was secured at its exit site with a 3-0 nylon suture. The IJ cannulation site was closed with a 4-0 subcuticular stitch. A sterile dressing was applied. The patient tolerated the procedure well and was transferred to the recovery room in stable condition. All needle and sponge counts were correct.  Deitra Mayo, MD, FACS [facility] [place]: 05/27/2014 DATE OF DICTATION: 05/27/2014

## 2014-05-27 NOTE — Progress Notes (Addendum)
PT Cancellation Note  Patient Details Name: [name] [name] MRN: [id number] DOB: [birth year]   Cancelled Treatment:    Reason Eval/Treat Not Completed: Patient at procedure or test/unavailable  Currently off floor;  Will follow up later today as time allows;  Otherwise, will follow up for PT tomorrow;   Thank you,  Roney Marion, PT  Acute Rehabilitation Services Pager [phone] Office [phone]     Roney Marion [facility] 05/27/2014, 11:07 AM

## 2014-05-27 NOTE — Progress Notes (Signed)
[facility] PROGRESS NOTE  [name] [name] [id number] DOB: 09/24/1941 DOA: 05/17/2014 PCP: Gwendolyn Grant, MD Brief Narrative: 73 y/o female with h/o CKD, DM, HTN who presented with malignant HTN. Had NSTEMI and acute on chronic Renal failure. Cardiology was consulted for NSTEMI. Antihypertensive medication has been continually adjusted but BP's still elevated as such Nephrology consulted. Currently considering HD while in house.  Assessment/Plan: 1. Hypertensive emergency - Pt is on amlodipine, coreg (dose increased today), clonidine, and imdur. Spironolactone discontinued as well as narcotic medication. Increased imdur to 90 mg daily.  - Will work up for secondary causes specifically for hyperaldosteronism. (Aldosterone and renin plasma levels with ratio ordered) (currently in process) - BP still elevated and HTN difficult to control. As such consulted Nephrology  2. Chest pain/and NSTEMI - Cardiology on board recommending further work up which can be done as outpatient.  Plan is to continue ASA, B blocker, statin.  Anticoagulation held in setting of severe HTN. - Patient is currently on aspirin, beta blocker, statin. No ACE inhibitor due to renal dysfunction. Anticoagulation held in setting of severe hypertension. - chest pain resolved.  3. Acute on chronic kidney failure - Most likely related to end organ damage from recent hypertensive emergency. - Will continue to monitor serum creatinine and adjust blood pressure medications - S creatinine stable at 4 - Consulted Nephrology and currently team considering HD while in house. - tunneled catheter placed in today and plan for CLIP as per renal.   4. diabetes mellitus - Continue sliding scale insulin. - CBG (last 3)   Recent Labs  05/27/14 1105 05/27/14 1157 05/27/14 1641  GLUCAP 91 106* 72      5. CAP - Pt received appropriate antibiotic course while in house. - Left lower lobe opacity reported on chest  x-ray - resolving.  6. Abdominal discomfort - resolving. And most likely due to constipation.  7. Questionable ascites: - US ABDOMEN ordered and liver panel ordered for am.  - she might need diagnostic paracentesis if moderate amount of ascites.   Code Status: full Family Communication: No family at bedside Disposition Plan: Per nephrology   Consultants:  Cardiology  Nephrology   Antibiotics:  As listed above  HPI/Subjective: No acute issues reported overnight. No new complaints.  Comfortable.   Objective: Filed Vitals:   05/27/14 1320  BP: 182/84  Pulse: 56  Temp:   Resp:     Intake/Output Summary (Last 24 hours) at 05/27/14 1358 Last data filed at 05/27/14 1225  Gross per 24 hour  Intake    350 ml  Output   1600 ml  Net  -1250 ml   Filed Weights   05/24/14 1113 05/26/14 2121 05/27/14 1315  Weight: 68.992 kg (152 lb 1.6 oz) 67.8 kg (149 lb 7.6 oz) 66.8 kg (147 lb 4.3 oz)    Exam:   General:  Patient in no acute distress, alert and awake  Cardiovascular: Regular rate and rhythm, no murmurs  Respiratory: No increased work of breathing, and no wheezes  Abdomen: Soft, distended, mild generalized tenderness. bs+  Musculoskeletal: No cyanosis or clubbing   Data Reviewed: Basic Metabolic Panel:  Recent Labs Lab 05/21/14 0333 05/23/14 0035 05/24/14 0401 05/25/14 1109 05/26/14 0400 05/27/14 1014  NA 140 139 139 142 141 137  K 3.7 4.0 4.6 4.9 4.9 4.8  CL 105 103 104 106 104  --   CO2 21 20 22 22 24   --   GLUCOSE 121* 83 131* 128* 106* 90  BUN 58* 60* 61* 61* 65*  --   CREATININE 4.04* 4.05* 4.09* 4.17* 4.30*  --   CALCIUM 8.1* 8.3* 8.5 8.7 8.7  --   PHOS  --   --   --  5.1*  --   --    Liver Function Tests:  Recent Labs Lab 05/25/14 1109 05/26/14 2102  ALT  --  40*  ALBUMIN 1.8*  --    No results found for this basename: LIPASE, AMYLASE,  in the last 168 hours No results found for this basename: AMMONIA,  in the last 168  hours CBC:  Recent Labs Lab 05/21/14 0333 05/23/14 0035 05/24/14 0401 05/26/14 0403 05/27/14 1014  WBC 3.4* 3.9* 3.4* 3.5*  --   HGB 9.7* 9.1* 8.8* 8.6* 8.8*  HCT 28.1* 26.8* 26.2* 25.8* 26.0*  MCV 85.7 87.9 88.2 89.9  --   PLT 102* 98* 90* 95*  --    Cardiac Enzymes: No results found for this basename: CKTOTAL, CKMB, CKMBINDEX, TROPONINI,  in the last 168 hours BNP (last 3 results)  Recent Labs  05/17/14 1956  PROBNP 20847.0*   CBG:  Recent Labs Lab 05/26/14 1638 05/26/14 1852 05/26/14 2118 05/27/14 0751 05/27/14 1105  GLUCAP 154* 167* 107* 163* 91    Recent Results (from the past 240 hour(s))  CLOSTRIDIUM DIFFICILE BY PCR     Status: None   Collection Time    05/17/14  4:01 PM      Result Value Ref Range Status   C difficile by pcr NEGATIVE  NEGATIVE Final   Comment: Performed at [place], BLOOD (ROUTINE X 2)     Status: None   Collection Time    05/17/14  4:57 PM      Result Value Ref Range Status   Specimen Description BLOOD LEFT FOREARM  4 ML IN [facility] BOTTLE   Final   Special Requests Normal   Final   Culture  Setup Time     Final   Value: 05/17/2014 21:15     Performed at [facility]   Culture     Final   Value: NO GROWTH 5 DAYS     Performed at [facility]   Report Status 05/23/2014 FINAL   Final  CULTURE, BLOOD (ROUTINE X 2)     Status: None   Collection Time    05/17/14  4:57 PM      Result Value Ref Range Status   Specimen Description BLOOD RIGHT FOREARM  4 ML IN [facility] BOTTLE   Final   Special Requests Normal   Final   Culture  Setup Time     Final   Value: 05/17/2014 21:15     Performed at [facility]   Culture     Final   Value: NO GROWTH 5 DAYS     Performed at [facility]   Report Status 05/23/2014 FINAL   Final  MRSA PCR SCREENING     Status: None   Collection Time    05/17/14  5:40 PM      Result Value Ref Range Status   MRSA by PCR NEGATIVE  NEGATIVE Final   Comment:             The GeneXpert MRSA Assay (FDA     approved for NASAL specimens     only), is one component of a     comprehensive MRSA colonization     surveillance program. It is not  intended to diagnose MRSA     infection nor to guide or     monitor treatment for     MRSA infections.     Studies: Dg Chest Port 1 View  05/27/2014   CLINICAL DATA:  Dialysis catheter placement.  EXAM: PORTABLE CHEST - 1 VIEW  COMPARISON:  05/26/2014.  FINDINGS: New RIGHT IJ dialysis catheter is present with the distal tip at the junction of the superior vena cava and RIGHT atrium. Basilar atelectasis. No pneumothorax. No airspace disease or effusion. The cardiopericardial silhouette is upper limits of normal for projection.  IMPRESSION: Uncomplicated new RIGHT IJ dialysis catheter.   Electronically Signed   By: Dereck Ligas M.D.   On: 05/27/2014 11:26   Dg Chest Port 1 View  05/26/2014   CLINICAL DATA:  Hypertension.  Diabetes.  EXAM: PORTABLE CHEST - 1 VIEW  COMPARISON:  05/18/2014.  FINDINGS: Mediastinum and hilar structures are normal. Cardiomegaly with pulmonary vascular prominence. Interim partial clearing of left lower lobe infiltrate. No pleural effusion or pneumothorax. No acute bony abnormality.  IMPRESSION: 1. Stable cardiomegaly with pulmonary vascular prominence. 2. Interim partial clearing of left lower lobe infiltrate.   Electronically Signed   By: Marcello Moores  Register   On: 05/26/2014 15:51    Scheduled Meds: . amLODipine  10 mg Oral Daily  . antiseptic oral rinse  15 mL Mouth Rinse BID  . aspirin EC  325 mg Oral Daily  . atorvastatin  40 mg Oral q1800  . carvedilol  25 mg Oral BID WC  . cefUROXime (ZINACEF)  IV  1.5 g Intravenous On Call to OR  . cloNIDine  0.3 mg Oral TID  . feeding supplement (RESOURCE BREEZE)  1 Container Oral BID BM  . furosemide  80 mg Oral BID  . [START ON 05/28/2014] heparin  20 Units/kg Dialysis Once in dialysis  . heparin  5,000 Units Subcutaneous 3 times per day  .  insulin aspart  0-15 Units Subcutaneous TID WC  . insulin aspart  0-5 Units Subcutaneous QHS  . isosorbide mononitrate  60 mg Oral Daily  . sodium chloride  3 mL Intravenous Q12H  . timolol  1 drop Right Eye q morning - 10a   Continuous Infusions:    Time spent:25 minutes    Miquela Costabile  [facility] Pager [phone] If 7PM-7AM, please contact night-coverage at [email or web address], password [facility] 05/27/2014, 1:58 PM  LOS: 10 days

## 2014-05-27 NOTE — Interval H&P Note (Signed)
History and Physical Interval Note:  05/27/2014 9:51 AM  [name] [name]  has presented today for surgery, with the diagnosis of ESRD  The various methods of treatment have been discussed with the patient and family. After consideration of risks, benefits and other options for treatment, the patient has consented to  Procedure(s): INSERTION OF DIALYSIS CATHETER (N/A) as a surgical intervention .  The patient's history has been reviewed, patient examined, no change in status, stable for surgery.  I have reviewed the patient's chart and labs.  Questions were answered to the patient's satisfaction.     DICKSON,CHRISTOPHER S

## 2014-05-28 ENCOUNTER — Inpatient Hospital Stay (HOSPITAL_COMMUNITY): Payer: Medicare HMO

## 2014-05-28 ENCOUNTER — Encounter (HOSPITAL_COMMUNITY): Payer: Self-pay | Admitting: Vascular Surgery

## 2014-05-28 DIAGNOSIS — N19 Unspecified kidney failure: Secondary | ICD-10-CM

## 2014-05-28 LAB — COMPREHENSIVE METABOLIC PANEL
ALBUMIN: 1.9 g/dL — AB (ref 3.5–5.2)
ALT: 35 U/L (ref 0–35)
AST: 59 U/L — ABNORMAL HIGH (ref 0–37)
Alkaline Phosphatase: 231 U/L — ABNORMAL HIGH (ref 39–117)
Anion gap: 13 (ref 5–15)
BUN: 41 mg/dL — AB (ref 6–23)
CALCIUM: 8.3 mg/dL — AB (ref 8.4–10.5)
CO2: 28 mEq/L (ref 19–32)
CREATININE: 3.47 mg/dL — AB (ref 0.50–1.10)
Chloride: 102 mEq/L (ref 96–112)
GFR calc Af Amer: 14 mL/min — ABNORMAL LOW (ref >90)
GFR calc non Af Amer: 12 mL/min — ABNORMAL LOW (ref >90)
GLUCOSE: 108 mg/dL — AB (ref 70–99)
Potassium: 4.1 mEq/L (ref 3.7–5.3)
Sodium: 143 mEq/L (ref 137–147)
TOTAL PROTEIN: 6.4 g/dL (ref 6.0–8.3)
Total Bilirubin: 0.3 mg/dL (ref 0.3–1.2)

## 2014-05-28 LAB — SURGICAL PCR SCREEN
MRSA, PCR: NEGATIVE
STAPHYLOCOCCUS AUREUS: NEGATIVE

## 2014-05-28 LAB — GLUCOSE, CAPILLARY
GLUCOSE-CAPILLARY: 172 mg/dL — AB (ref 70–99)
Glucose-Capillary: 122 mg/dL — ABNORMAL HIGH (ref 70–99)
Glucose-Capillary: 118 mg/dL — ABNORMAL HIGH (ref 70–99)

## 2014-05-28 LAB — ALDOSTERONE + RENIN ACTIVITY W/ RATIO
ALDO / PRA Ratio: 29.2 Ratio — ABNORMAL HIGH (ref 0.9–28.9)
Aldosterone: 7 ng/dL
PRA LC/MS/MS: 0.24 ng/mL/h — AB (ref 0.25–5.82)

## 2014-05-28 MED ORDER — CLONIDINE HCL 0.1 MG PO TABS
0.1000 mg | ORAL_TABLET | Freq: Three times a day (TID) | ORAL | Status: DC
  Administered 2014-05-28: 0.1 mg via ORAL
  Filled 2014-05-28 (×4): qty 1

## 2014-05-28 MED ORDER — DEXTROSE 5 % IV SOLN
1.5000 g | INTRAVENOUS | Status: AC
  Administered 2014-05-29: 1.5 g via INTRAVENOUS
  Filled 2014-05-28: qty 1.5

## 2014-05-28 MED ORDER — DARBEPOETIN ALFA-POLYSORBATE 25 MCG/0.42ML IJ SOLN
25.0000 ug | Freq: Once | INTRAMUSCULAR | Status: AC
  Administered 2014-05-29: 25 ug via INTRAVENOUS
  Filled 2014-05-28: qty 0.42

## 2014-05-28 NOTE — Consult Note (Signed)
Vein map reviewed.  No adequate basilic or cephalic vein for a fistula.  Filed Vitals:   05/28/14 0901 05/28/14 0930 05/28/14 0955 05/28/14 1012  BP: 174/61 92/50 148/67 194/72  Pulse: 59 63 60 60  Temp:    97.7 F (36.5 C)  TempSrc:    Oral  Resp: 20 16 16 16   Height:      Weight:    130 lb 8.2 oz (59.2 kg)  SpO2:    98%    2+ radial and brachial pulse bilaterally  Will plan for left arm AV graft tomorrow by my partner Dr Scot Dock  NPO p midnight Consent Cefuroxime on call  Ruta Hinds, MD [facility] [place] Office: [phone] Pager: [phone]

## 2014-05-28 NOTE — Procedures (Signed)
I was present at this dialysis session, have reviewed the session itself and made  appropriate changes  Kelly Splinter MD (pgr) [phone]    [phone] 05/28/2014, 10:14 AM

## 2014-05-28 NOTE — Progress Notes (Addendum)
BP 210/69. NP on call notified; verbal order received to administer 1000 dose of clonidine 0.3 mg now.  Patient will go to hemodialysis this morning; NP aware.  Will continue to monitor.

## 2014-05-28 NOTE — Progress Notes (Addendum)
VASCULAR LAB PRELIMINARY  PRELIMINARY  PRELIMINARY  PRELIMINARY  Right  Upper Extremity Vein Map    Cephalic  Segment Diameter Depth Comment  1. Axilla mm mm Too small to measure  2. Mid upper arm 1.5 mm mm   3. Above AC 1.5 mm mm   4. In AC 2.5 mm mm Wall thickening  5. Below AC 1.5 mm mm Branch, wall thickening  6. Mid forearm 1.3 mm mm   7. Wrist mm mm thrombosed   mm mm    mm mm    mm mm    Basilic  Segment Diameter Depth Comment  1. Axilla mm mm   2. Mid upper arm 2.3 mm 10 mm   3. Above AC 1.8 mm 10.3 mm   4. In AC 1.4 mm 8.4 mm   5. Below AC 1.8 mm 2.2 mm    mm mm    mm mm    mm mm    mm mm    mm mm     Left Upper Extremity Vein Map    Cephalic  Segment Diameter Depth Comment  1. Axilla mm mm Too small to measure  2. Mid upper arm 1.1 mm mm   3. Above AC 1.1 mm mm   4. In AC 2.0 mm mm   5. Below AC 1.1 mm mm   6. Mid forearm 1.7 mm mm   7. Wrist mm mm thrombosed   mm mm    mm mm    mm mm    Basilic  Segment Diameter Depth Comment  1. Axilla 5.6 mm 0.93 mm branch  2. Mid upper arm 1.5 mm 9 mm   3. Above AC 1.7 mm 10.8 mm   4. In AC 1.6 mm 5.2 mm   5. Below AC 1.5 mm 2.5 mm    mm mm    mm mm    mm mm    mm mm    mm mm     Jaimen Melone, RVT 05/28/2014, 11:27 AM

## 2014-05-28 NOTE — Progress Notes (Signed)
PROGRESS NOTE  [name] [name] [id number] DOB: [birth year] DOA: 05/17/2014 PCP: Gwendolyn Grant, MD  Subjective/ 24 H Interval events - no complaints, seen after HD  Assessment/Plan: Hypertension - Pt is on amlodipine, coreg (dose increased today), clonidine, and imdur. Spironolactone discontinued as well as narcotic medication. Increased imdur to 90 mg daily.  Chest pain/and NSTEMI - Cardiology recommended further work up which can be done as outpatient. Plan is to continue ASA, B blocker, statin. Anticoagulation held in setting of severe HTN.  - Patient is currently on aspirin, beta blocker, statin. No ACE inhibitor due to renal dysfunction. Anticoagulation held in setting of severe hypertension.  - chest pain resolved.  Acute on chronic kidney failure - Most likely related to end organ damage from HTN, needs HD now Diabetes mellitus  - Continue sliding scale insulin.  CAP - Pt received appropriate antibiotic course while in house.  - resolving.  Abdominal discomfort - resolving. And most likely due to constipation.  Questionable ascites: - US ABDOMEN ordered  Diet: renal  Fluids: none DVT Prophylaxis: heparin  Code Status: Full Family Communication: family at bedside  Disposition Plan: home when ready   Consultants:  Cardiology   Nephrology  Vascular surgery  Procedures:  None    Antibiotics  Anti-infectives   Start     Dose/Rate Route Frequency Ordered Stop   05/29/14 0600  cefUROXime (ZINACEF) 1.5 g in dextrose 5 % 50 mL IVPB     1.5 g 100 mL/hr over 30 Minutes Intravenous On call to O.R. 05/28/14 1547 05/30/14 0559   05/27/14 0600  cefUROXime (ZINACEF) 1.5 g in dextrose 5 % 50 mL IVPB     1.5 g 100 mL/hr over 30 Minutes Intravenous On call to O.R. 05/26/14 1951 05/28/14 0559   05/21/14 1800  azithromycin (ZITHROMAX) tablet 500 mg  Status:  Discontinued     500 mg Oral Daily 05/21/14 1336 05/24/14 1151   05/18/14 1700  cefTRIAXone (ROCEPHIN) 1 g in  dextrose 5 % 50 mL IVPB  Status:  Discontinued     1 g 100 mL/hr over 30 Minutes Intravenous Every 24 hours 05/17/14 1734 05/24/14 1151   05/17/14 1745  azithromycin (ZITHROMAX) 500 mg in dextrose 5 % 250 mL IVPB  Status:  Discontinued     500 mg 250 mL/hr over 60 Minutes Intravenous Every 24 hours 05/17/14 1734 05/21/14 1336   05/17/14 1600  cefTRIAXone (ROCEPHIN) 1 g in dextrose 5 % 50 mL IVPB     1 g 100 mL/hr over 30 Minutes Intravenous  Once 05/17/14 1548 05/17/14 1720   05/17/14 1600  azithromycin (ZITHROMAX) 500 mg in dextrose 5 % 250 mL IVPB  Status:  Discontinued     500 mg 250 mL/hr over 60 Minutes Intravenous  Once 05/17/14 1548 05/17/14 1735     Antibiotics Given (last 72 hours)   None      Studies  Filed Vitals:   05/28/14 0930 05/28/14 0955 05/28/14 1012 05/28/14 1653  BP: 92/50 148/67 194/72 178/59  Pulse: 63 60 60 68  Temp:   97.7 F (36.5 C) 99.4 F (37.4 C)  TempSrc:   Oral Oral  Resp: 16 16 16 16   Height:      Weight:   59.2 kg (130 lb 8.2 oz)   SpO2:   98% 98%    Intake/Output Summary (Last 24 hours) at 05/28/14 1805 Last data filed at 05/28/14 1012  Gross per 24 hour  Intake  0 ml  Output   1945 ml  Net  -1945 ml   Filed Weights   05/27/14 2104 05/28/14 0658 05/28/14 1012  Weight: 63.368 kg (139 lb 11.2 oz) 61.1 kg (134 lb 11.2 oz) 59.2 kg (130 lb 8.2 oz)    Exam:  General:  NAD  Cardiovascular: RRR  Respiratory: CTA biL  Abdomen: soft, non tender  MSK: no edema  Neuro: non focal  Data Reviewed: Basic Metabolic Panel:  Recent Labs Lab 05/24/14 0401 05/25/14 1109 05/26/14 0400 05/27/14 1014 05/27/14 1300 05/28/14 0711  NA 139 142 141 137 142  141 143  K 4.6 4.9 4.9 4.8 4.8  4.8 4.1  CL 104 106 104  --  103  102 102  CO2 22 22 24   --  26  25 28   GLUCOSE 131* 128* 106* 90 82  81 108*  BUN 61* 61* 65*  --  65*  66* 41*  CREATININE 4.09* 4.17* 4.30*  --  4.27*  4.23* 3.47*  CALCIUM 8.5 8.7 8.7  --  8.4  8.4  8.3*  PHOS  --  5.1*  --   --  5.0*  --    Liver Function Tests:  Recent Labs Lab 05/25/14 1109 05/26/14 2102 05/27/14 1300 05/28/14 0711  AST  --   --   --  59*  ALT  --  40*  --  35  ALKPHOS  --   --   --  231*  BILITOT  --   --   --  0.3  PROT  --   --   --  6.4  ALBUMIN 1.8*  --  1.8* 1.9*   No results found for this basename: LIPASE, AMYLASE,  in the last 168 hours No results found for this basename: AMMONIA,  in the last 168 hours CBC:  Recent Labs Lab 05/23/14 0035 05/24/14 0401 05/26/14 0403 05/27/14 1014 05/27/14 1500  WBC 3.9* 3.4* 3.5*  --  3.4*  HGB 9.1* 8.8* 8.6* 8.8* 8.2*  HCT 26.8* 26.2* 25.8* 26.0* 25.5*  MCV 87.9 88.2 89.9  --  90.7  PLT 98* 90* 95*  --  109*   Cardiac Enzymes: No results found for this basename: CKTOTAL, CKMB, CKMBINDEX, TROPONINI,  in the last 168 hours BNP (last 3 results)  Recent Labs  05/17/14 1956  PROBNP 20847.0*   CBG:  Recent Labs Lab 05/27/14 1157 05/27/14 1641 05/27/14 2101 05/28/14 1223 05/28/14 1609  GLUCAP 106* 72 172* 122* 118*    No results found for this or any previous visit (from the past 240 hour(s)).   Studies: Dg Chest Port 1 View  05/27/2014   CLINICAL DATA:  Dialysis catheter placement.  EXAM: PORTABLE CHEST - 1 VIEW  COMPARISON:  05/26/2014.  FINDINGS: New RIGHT IJ dialysis catheter is present with the distal tip at the junction of the superior vena cava and RIGHT atrium. Basilar atelectasis. No pneumothorax. No airspace disease or effusion. The cardiopericardial silhouette is upper limits of normal for projection.  IMPRESSION: Uncomplicated new RIGHT IJ dialysis catheter.   Electronically Signed   By: Dereck Ligas M.D.   On: 05/27/2014 11:26    Scheduled Meds: . amLODipine  10 mg Oral Daily  . antiseptic oral rinse  15 mL Mouth Rinse BID  . aspirin EC  325 mg Oral Daily  . atorvastatin  40 mg Oral q1800  . carvedilol  25 mg Oral BID WC  . [START ON 05/29/2014] cefUROXime (ZINACEF)  IV  1.5 g Intravenous On Call to OR  . cloNIDine  0.1 mg Oral TID  . darbepoetin (ARANESP) injection - DIALYSIS  25 mcg Intravenous Once  . feeding supplement (RESOURCE BREEZE)  1 Container Oral BID BM  . furosemide  80 mg Oral BID  . heparin  5,000 Units Subcutaneous 3 times per day  . insulin aspart  0-15 Units Subcutaneous TID WC  . insulin aspart  0-5 Units Subcutaneous QHS  . isosorbide mononitrate  90 mg Oral Daily  . senna-docusate  1 tablet Oral BID  . sodium chloride  3 mL Intravenous Q12H  . timolol  1 drop Right Eye q morning - 10a   Continuous Infusions:   Principal Problem:   Hypertensive emergency Active Problems:   Type II or unspecified type diabetes mellitus with renal manifestations, uncontrolled   HYPERLIPIDEMIA   Chronic kidney disease, stage IV (severe)   CAP (community acquired pneumonia)   Malignant hypertension   Chest pain   Elevated troponin   NSTEMI (non-ST elevated myocardial infarction)   Time spent: 35  This note has been created with Surveyor, quantity. Any transcriptional errors are unintentional.   Marzetta Board, MD [facility] Pager [phone]. If 7 PM - 7 AM, please contact night-coverage at [email or web address], password [facility] 05/28/2014, 6:05 PM  LOS: 11 days

## 2014-05-28 NOTE — Progress Notes (Signed)
Pt reports knots in abdomen. Pt refused insulin and heparin injections in abdomen because of it. Pt would like MD to address.

## 2014-05-28 NOTE — Progress Notes (Signed)
[facility] Progress Note   Subjective: getting tunneled HD cath today  Filed Vitals:   05/28/14 0851 05/28/14 0901 05/28/14 0930 05/28/14 0955  BP: 107/56 174/61 92/50 148/67  Pulse: 53 59 63 60  Temp:      TempSrc:      Resp: 18 20 16 16   Height:      Weight:      SpO2:       Exam: Awake, slurred speech, no change No jvd  Chest mild bibasilar rales  RRR no MRG  Abd ascites 2+, not tense , nontender abd No LE edema Neuro gen weakness, confused   Assessment:  1 CKD V / ESRD, starting dialysis- feeling better today, 2nd HD today, perm access pending per [facility] 2 HTN - cont to decrease vol w HD and cont norvasc/coreg, wean off clonidine 4 Ascites- she has ascites on exam and hx of liver disease per chart due to fatty liver or hep B; hep B SAg was negative done yesterday, she is SAb positive c/w past infection/immunization and immunity 5 AMS- as above  6 DM 1 since age 73  7 Anemia Hb 8.6, tsat 42%, give darbe 25 ug x 1  Plan- HD today and tomorrow then TTS.  She will ready from a HD standpoint to be discharged after HD on Sat, they are expecting her at OP dialysis next Tuesday    Kelly Splinter MD  pager [phone]    cell [phone]  05/28/2014, 10:03 AM     Recent Labs Lab 05/24/14 0401 05/25/14 1109 05/26/14 0400 05/27/14 1014 05/27/14 1300 05/28/14 0711  NA 139 142 141 137 142  141 143  K 4.6 4.9 4.9 4.8 4.8  4.8 4.1  CL 104 106 104  --  103  102 102  CO2 22 22 24   --  26  25 28   GLUCOSE 131* 128* 106* 90 82  81 108*  BUN 61* 61* 65*  --  65*  66* 41*  CREATININE 4.09* 4.17* 4.30*  --  4.27*  4.23* 3.47*  CALCIUM 8.5 8.7 8.7  --  8.4  8.4 8.3*  PHOS  --  5.1*  --   --  5.0*  --     Recent Labs Lab 05/25/14 1109 05/26/14 2102 05/27/14 1300 05/28/14 0711  AST  --   --   --  59*  ALT  --  40*  --  35  ALKPHOS  --   --   --  231*  BILITOT  --   --   --  0.3  PROT  --   --   --  6.4  ALBUMIN 1.8*  --  1.8* 1.9*    Recent  Labs Lab 05/24/14 0401 05/26/14 0403 05/27/14 1014 05/27/14 1500  WBC 3.4* 3.5*  --  3.4*  HGB 8.8* 8.6* 8.8* 8.2*  HCT 26.2* 25.8* 26.0* 25.5*  MCV 88.2 89.9  --  90.7  PLT 90* 95*  --  109*   . amLODipine  10 mg Oral Daily  . antiseptic oral rinse  15 mL Mouth Rinse BID  . aspirin EC  325 mg Oral Daily  . atorvastatin  40 mg Oral q1800  . carvedilol  25 mg Oral BID WC  . cloNIDine  0.3 mg Oral TID  . feeding supplement (RESOURCE BREEZE)  1 Container Oral BID BM  . furosemide  80 mg Oral BID  . heparin  5,000 Units Subcutaneous 3 times per day  .  insulin aspart  0-15 Units Subcutaneous TID WC  . insulin aspart  0-5 Units Subcutaneous QHS  . isosorbide mononitrate  90 mg Oral Daily  . senna-docusate  1 tablet Oral BID  . sodium chloride  3 mL Intravenous Q12H  . timolol  1 drop Right Eye q morning - 10a     acetaminophen, acetaminophen, alum & mag hydroxide-simeth, diphenhydrAMINE, labetalol, ondansetron (ZOFRAN) IV, ondansetron, oxyCODONE-acetaminophen

## 2014-05-29 ENCOUNTER — Inpatient Hospital Stay (HOSPITAL_COMMUNITY): Payer: Medicare HMO | Admitting: Anesthesiology

## 2014-05-29 ENCOUNTER — Encounter (HOSPITAL_COMMUNITY): Payer: Medicare HMO | Admitting: Anesthesiology

## 2014-05-29 ENCOUNTER — Encounter (HOSPITAL_COMMUNITY)
Admission: EM | Disposition: A | Discharge status: Home Health | Payer: Self-pay | Source: Home / Self Care | Attending: Family Medicine

## 2014-05-29 ENCOUNTER — Encounter (HOSPITAL_COMMUNITY): Payer: Self-pay | Admitting: Anesthesiology

## 2014-05-29 DIAGNOSIS — N185 Chronic kidney disease, stage 5: Secondary | ICD-10-CM

## 2014-05-29 HISTORY — PX: AV FISTULA PLACEMENT: SHX1204

## 2014-05-29 LAB — CBC
HCT: 25 % — ABNORMAL LOW (ref 36.0–46.0)
Hemoglobin: 8.2 g/dL — ABNORMAL LOW (ref 12.0–15.0)
MCH: 29.6 pg (ref 26.0–34.0)
MCHC: 32.8 g/dL (ref 30.0–36.0)
MCV: 90.3 fL (ref 78.0–100.0)
PLATELETS: 97 10*3/uL — AB (ref 150–400)
RBC: 2.77 MIL/uL — AB (ref 3.87–5.11)
RDW: 14.6 % (ref 11.5–15.5)
WBC: 4.1 10*3/uL (ref 4.0–10.5)

## 2014-05-29 LAB — GLUCOSE, CAPILLARY
GLUCOSE-CAPILLARY: 97 mg/dL (ref 70–99)
GLUCOSE-CAPILLARY: 121 mg/dL — AB (ref 70–99)
GLUCOSE-CAPILLARY: 106 mg/dL — AB (ref 70–99)
Glucose-Capillary: 154 mg/dL — ABNORMAL HIGH (ref 70–99)
Glucose-Capillary: 79 mg/dL (ref 70–99)

## 2014-05-29 LAB — BASIC METABOLIC PANEL
Anion gap: 11 (ref 5–15)
BUN: 29 mg/dL — ABNORMAL HIGH (ref 6–23)
CALCIUM: 8.3 mg/dL — AB (ref 8.4–10.5)
CHLORIDE: 102 meq/L (ref 96–112)
CO2: 30 meq/L (ref 19–32)
CREATININE: 3.04 mg/dL — AB (ref 0.50–1.10)
GFR calc non Af Amer: 14 mL/min — ABNORMAL LOW (ref >90)
GFR, EST AFRICAN AMERICAN: 17 mL/min — AB (ref >90)
Glucose, Bld: 111 mg/dL — ABNORMAL HIGH (ref 70–99)
Potassium: 4 mEq/L (ref 3.7–5.3)
Sodium: 143 mEq/L (ref 137–147)

## 2014-05-29 SURGERY — INSERTION OF ARTERIOVENOUS (AV) GORE-TEX GRAFT ARM
Anesthesia: Monitor Anesthesia Care | Site: Arm Lower | Laterality: Left

## 2014-05-29 MED ORDER — PROPOFOL 10 MG/ML IV BOLUS
INTRAVENOUS | Status: AC
  Filled 2014-05-29: qty 20

## 2014-05-29 MED ORDER — LISINOPRIL 10 MG PO TABS
10.0000 mg | ORAL_TABLET | Freq: Every evening | ORAL | Status: DC
  Administered 2014-05-29 – 2014-05-30 (×2): 10 mg via ORAL
  Filled 2014-05-29 (×3): qty 1

## 2014-05-29 MED ORDER — PROPOFOL 10 MG/ML IV BOLUS
INTRAVENOUS | Status: DC | PRN
Start: 2014-05-29 — End: 2014-05-29
  Administered 2014-05-29 (×3): 10 mg via INTRAVENOUS
  Administered 2014-05-29 (×2): 20 mg via INTRAVENOUS
  Administered 2014-05-29: 10 mg via INTRAVENOUS

## 2014-05-29 MED ORDER — HEPARIN SODIUM (PORCINE) 1000 UNIT/ML IJ SOLN
INTRAMUSCULAR | Status: DC | PRN
  Administered 2014-05-29: 5 mL via INTRAVENOUS

## 2014-05-29 MED ORDER — LIDOCAINE HCL (CARDIAC) 20 MG/ML IV SOLN
INTRAVENOUS | Status: DC | PRN
  Administered 2014-05-29: 50 mg via INTRAVENOUS

## 2014-05-29 MED ORDER — METOPROLOL TARTRATE 1 MG/ML IV SOLN
2.5000 mg | Freq: Once | INTRAVENOUS | Status: AC
  Administered 2014-05-29: 2.5 mg via INTRAVENOUS
  Filled 2014-05-29: qty 5

## 2014-05-29 MED ORDER — HEPARIN SODIUM (PORCINE) 1000 UNIT/ML DIALYSIS: 1000.0000 [IU] | INTRAMUSCULAR | Status: DC | PRN

## 2014-05-29 MED ORDER — HEPARIN SODIUM (PORCINE) 1000 UNIT/ML DIALYSIS: 2000.0000 [IU] | INTRAMUSCULAR | Status: DC | PRN

## 2014-05-29 MED ORDER — 0.9 % SODIUM CHLORIDE (POUR BTL) OPTIME
TOPICAL | Status: DC | PRN
  Administered 2014-05-29: 1000 mL

## 2014-05-29 MED ORDER — DEXTROSE 5 % IV SOLN
INTRAVENOUS | Status: AC
  Filled 2014-05-29: qty 1.5

## 2014-05-29 MED ORDER — SODIUM CHLORIDE 0.9 % IV SOLN
INTRAVENOUS | Status: DC | PRN
  Administered 2014-05-29: 12:00:00 via INTRAVENOUS

## 2014-05-29 MED ORDER — SODIUM CHLORIDE 0.9 % IR SOLN
Status: DC | PRN
  Administered 2014-05-29: 12:00:00

## 2014-05-29 MED ORDER — LIDOCAINE HCL (CARDIAC) 20 MG/ML IV SOLN
INTRAVENOUS | Status: DC | PRN
  Administered 2014-05-29: 25 mg via INTRAVENOUS

## 2014-05-29 MED ORDER — PROMETHAZINE HCL 25 MG PO TABS
12.5000 mg | ORAL_TABLET | Freq: Four times a day (QID) | ORAL | Status: DC | PRN
  Administered 2014-05-29 – 2014-05-30 (×2): 12.5 mg via ORAL
  Filled 2014-05-29 (×2): qty 1

## 2014-05-29 MED ORDER — CALCIUM ACETATE 667 MG PO CAPS
667.0000 mg | ORAL_CAPSULE | Freq: Three times a day (TID) | ORAL | Status: DC
  Administered 2014-05-30 – 2014-05-31 (×4): 667 mg via ORAL
  Filled 2014-05-29 (×8): qty 1

## 2014-05-29 MED ORDER — DARBEPOETIN ALFA-POLYSORBATE 25 MCG/0.42ML IJ SOLN
INTRAMUSCULAR | Status: AC
  Filled 2014-05-29: qty 0.42

## 2014-05-29 MED ORDER — RENA-VITE PO TABS
1.0000 | ORAL_TABLET | Freq: Every day | ORAL | Status: DC
  Administered 2014-05-29 – 2014-05-30 (×2): 1 via ORAL
  Filled 2014-05-29 (×3): qty 1

## 2014-05-29 MED ORDER — THROMBIN 20000 UNITS EX SOLR
CUTANEOUS | Status: AC
  Filled 2014-05-29: qty 20000

## 2014-05-29 MED ORDER — LIDOCAINE-EPINEPHRINE (PF) 1 %-1:200000 IJ SOLN
INTRAMUSCULAR | Status: DC | PRN
  Administered 2014-05-29: 30 mL

## 2014-05-29 MED ORDER — MIDAZOLAM HCL 5 MG/5ML IJ SOLN
INTRAMUSCULAR | Status: DC | PRN
Start: 2014-05-29 — End: 2014-05-29
  Administered 2014-05-29: 2 mg via INTRAVENOUS

## 2014-05-29 MED ORDER — SODIUM CHLORIDE 0.9 % IV SOLN: 100.0000 mL | INTRAVENOUS | Status: DC | PRN

## 2014-05-29 MED ORDER — NEPRO/CARBSTEADY PO LIQD: 237.0000 mL | ORAL | Status: DC | PRN

## 2014-05-29 MED ORDER — FENTANYL CITRATE 0.05 MG/ML IJ SOLN: 25.0000 ug | INTRAMUSCULAR | Status: DC | PRN

## 2014-05-29 MED ORDER — SODIUM CHLORIDE 0.9 % IV SOLN
INTRAVENOUS | Status: DC
  Administered 2014-05-29: 11:00:00 via INTRAVENOUS

## 2014-05-29 MED ORDER — PENTAFLUOROPROP-TETRAFLUOROETH EX AERO: 1.0000 "application " | INHALATION_SPRAY | CUTANEOUS | Status: DC | PRN

## 2014-05-29 MED ORDER — ALTEPLASE 2 MG IJ SOLR
2.0000 mg | Freq: Once | INTRAMUSCULAR | Status: DC | PRN
Start: 2014-05-29 — End: 2014-05-29
  Filled 2014-05-29: qty 2

## 2014-05-29 MED ORDER — LIDOCAINE-PRILOCAINE 2.5-2.5 % EX CREA: 1.0000 "application " | TOPICAL_CREAM | CUTANEOUS | Status: DC | PRN

## 2014-05-29 MED ORDER — LIDOCAINE HCL (PF) 1 % IJ SOLN
INTRAMUSCULAR | Status: AC
  Filled 2014-05-29: qty 30

## 2014-05-29 MED ORDER — FENTANYL CITRATE 0.05 MG/ML IJ SOLN
INTRAMUSCULAR | Status: DC | PRN
  Administered 2014-05-29: 50 ug via INTRAVENOUS

## 2014-05-29 MED ORDER — LIDOCAINE-EPINEPHRINE (PF) 1 %-1:200000 IJ SOLN
INTRAMUSCULAR | Status: AC
  Filled 2014-05-29: qty 10

## 2014-05-29 MED ORDER — HEPARIN SODIUM (PORCINE) 1000 UNIT/ML IJ SOLN
INTRAMUSCULAR | Status: AC
  Filled 2014-05-29: qty 2

## 2014-05-29 MED ORDER — PROPOFOL INFUSION 10 MG/ML OPTIME
INTRAVENOUS | Status: DC | PRN
  Administered 2014-05-29: 50 ug/kg/min via INTRAVENOUS

## 2014-05-29 MED ORDER — METOPROLOL TARTRATE 1 MG/ML IV SOLN
INTRAVENOUS | Status: AC
  Filled 2014-05-29: qty 5

## 2014-05-29 MED ORDER — ONDANSETRON HCL 4 MG/2ML IJ SOLN: 4.0000 mg | Freq: Once | INTRAMUSCULAR | Status: DC | PRN

## 2014-05-29 MED ORDER — PROTAMINE SULFATE 10 MG/ML IV SOLN
INTRAVENOUS | Status: DC | PRN
  Administered 2014-05-29: 30 mg via INTRAVENOUS

## 2014-05-29 MED ORDER — FENTANYL CITRATE 0.05 MG/ML IJ SOLN
INTRAMUSCULAR | Status: AC
  Filled 2014-05-29: qty 5

## 2014-05-29 MED ORDER — LIDOCAINE HCL (PF) 1 % IJ SOLN: 5.0000 mL | INTRAMUSCULAR | Status: DC | PRN

## 2014-05-29 MED ORDER — MIDAZOLAM HCL 2 MG/2ML IJ SOLN
INTRAMUSCULAR | Status: AC
  Filled 2014-05-29: qty 2

## 2014-05-29 MED ORDER — PROTAMINE SULFATE 10 MG/ML IV SOLN
INTRAVENOUS | Status: AC
  Filled 2014-05-29: qty 10

## 2014-05-29 SURGICAL SUPPLY — 44 items
ARMBAND PINK RESTRICT EXTREMIT (MISCELLANEOUS) ×3 IMPLANT
BLADE 10 SAFETY STRL DISP (BLADE) IMPLANT
CANISTER SUCTION 2500CC (MISCELLANEOUS) ×3 IMPLANT
CLIP TI MEDIUM 6 (CLIP) ×3 IMPLANT
CLIP TI WIDE RED SMALL 6 (CLIP) ×3 IMPLANT
COVER SURGICAL LIGHT HANDLE (MISCELLANEOUS) ×3 IMPLANT
DECANTER SPIKE VIAL GLASS SM (MISCELLANEOUS) ×3 IMPLANT
DERMABOND ADVANCED (GAUZE/BANDAGES/DRESSINGS) ×2
DERMABOND ADVANCED .7 DNX12 (GAUZE/BANDAGES/DRESSINGS) ×1 IMPLANT
ELECT REM PT RETURN 9FT ADLT (ELECTROSURGICAL) ×3
ELECTRODE REM PT RTRN 9FT ADLT (ELECTROSURGICAL) ×1 IMPLANT
GEL ULTRASOUND 20GR AQUASONIC (MISCELLANEOUS) ×3 IMPLANT
GLOVE BIO SURGEON STRL SZ 6.5 (GLOVE) ×2 IMPLANT
GLOVE BIO SURGEON STRL SZ7.5 (GLOVE) ×3 IMPLANT
GLOVE BIO SURGEONS STRL SZ 6.5 (GLOVE) ×1
GLOVE BIOGEL PI IND STRL 6.5 (GLOVE) ×2 IMPLANT
GLOVE BIOGEL PI IND STRL 7.0 (GLOVE) ×1 IMPLANT
GLOVE BIOGEL PI IND STRL 7.5 (GLOVE) ×1 IMPLANT
GLOVE BIOGEL PI IND STRL 8 (GLOVE) ×1 IMPLANT
GLOVE BIOGEL PI INDICATOR 6.5 (GLOVE) ×4
GLOVE BIOGEL PI INDICATOR 7.0 (GLOVE) ×2
GLOVE BIOGEL PI INDICATOR 7.5 (GLOVE) ×2
GLOVE BIOGEL PI INDICATOR 8 (GLOVE) ×2
GLOVE ECLIPSE 6.5 STRL STRAW (GLOVE) ×3 IMPLANT
GOWN STRL REUS W/ TWL LRG LVL3 (GOWN DISPOSABLE) ×3 IMPLANT
GOWN STRL REUS W/ TWL XL LVL3 (GOWN DISPOSABLE) ×1 IMPLANT
GOWN STRL REUS W/TWL LRG LVL3 (GOWN DISPOSABLE) ×6
GOWN STRL REUS W/TWL XL LVL3 (GOWN DISPOSABLE) ×2
GRAFT GORETEX STRT 4-7X45 (Vascular Products) ×3 IMPLANT
KIT BASIN OR (CUSTOM PROCEDURE TRAY) ×3 IMPLANT
KIT ROOM TURNOVER OR (KITS) ×3 IMPLANT
NS IRRIG 1000ML POUR BTL (IV SOLUTION) ×3 IMPLANT
PACK CV ACCESS (CUSTOM PROCEDURE TRAY) ×3 IMPLANT
PAD ARMBOARD 7.5X6 YLW CONV (MISCELLANEOUS) ×6 IMPLANT
PROBE PENCIL 8 MHZ STRL DISP (MISCELLANEOUS) ×3 IMPLANT
SPONGE SURGIFOAM ABS GEL 100 (HEMOSTASIS) IMPLANT
SUT PROLENE 6 0 BV (SUTURE) ×12 IMPLANT
SUT VIC AB 3-0 SH 27 (SUTURE) ×4
SUT VIC AB 3-0 SH 27X BRD (SUTURE) ×2 IMPLANT
SUT VICRYL 4-0 PS2 18IN ABS (SUTURE) ×6 IMPLANT
TOWEL OR 17X24 6PK STRL BLUE (TOWEL DISPOSABLE) ×3 IMPLANT
TOWEL OR 17X26 10 PK STRL BLUE (TOWEL DISPOSABLE) ×3 IMPLANT
UNDERPAD 30X30 INCONTINENT (UNDERPADS AND DIAPERS) ×3 IMPLANT
WATER STERILE IRR 1000ML POUR (IV SOLUTION) ×3 IMPLANT

## 2014-05-29 NOTE — Op Note (Signed)
    NAME: [name] [name]   MRN: [id number] DOB: 04-05-1941    DATE OF OPERATION: 05/29/2014  PREOP DIAGNOSIS: stage V chronic kidney disease  POSTOP DIAGNOSIS: same  PROCEDURE: left upper arm AV graft  SURGEON: Judeth Cornfield. Scot Dock, MD, FACS  ASSIST: Izetta Dakin RNFA  ANESTHESIA: local with sedation   EBL: minimal  INDICATIONS: [name] [name] is a 73 y.o. female who presents for new access. Vein mapping showed that she did not have adequate cephalic vein or basilic vein for a fistula.  FINDINGS: the antecubital veins were quite small and therefore I placed an upper arm graft. The brachial artery was 3.5 mm. The axillary vein was 4 mm.  TECHNIQUE: The patient was taken to the operating room and sedated by anesthesia. The left upper extremity was prepped and draped in the usual sterile fashion. After the skin was anesthetized with 1% lidocaine, a longitudinal incision was made just above the antecubital level over the brachial artery and vein. The brachial artery was somewhat small but appeared reasonable. The brachial vein was small. I elected to place an upper arm graft. The separate longitudinal incision was made beneath the axilla after the skin was anesthetized. Here the high brachial vein was dissected free and was about a 4 mm vein. A tunnel was created between the 2 incisions and a 4-7 mm PTFE graft was tunneled between the 2 incisions. She was heparinized. Attention was first turned to the arterial anastomosis. The brachial artery was clamped proximally and distally and a longitudinal arteriotomy was made. A short segment of the 4 mm and the graft was excised, the graft slightly spatulated, and sewn end-to-side to the brachial artery using continuous 6-0 Prolene suture. The graft was then pulled the appropriate length for anastomosis to the high brachial vein. The vein was ligated distally and spatulated proximally. It was cut the appropriate length and sewn end-to-end to the vein  using continuous 6-0 Prolene suture. At the completion was a good thrill in the fistula. There was an ulnar signal with the Doppler but no radial signal. The ulnar signal augmented with compression of the graft and the radial signal is present with compression of the graft. Given the small artery the patient is at risk for steal but there appeared to be a reasonable ulnar signal at the completion. Hemostasis was obtained and the wounds and the heparin was partially reversed with protamine. The wounds were closed with a clear field Vicryl and the skin closed 4-0 Vicryl. Dermabond was applied. The patient tolerated the procedure well and was transferred to the recovery room in stable condition. All needle and sponge counts were correct.  Deitra Mayo, MD, FACS [facility]  DATE OF DICTATION:   05/29/2014

## 2014-05-29 NOTE — Progress Notes (Signed)
PT Cancellation NOte  Pt. Currently off the floor for AV graft.  Will follow up tomorrow.    Gerlean Ren PT [facility] [place] [phone]

## 2014-05-29 NOTE — Progress Notes (Signed)
PROGRESS NOTE  [name] [name] [id number] DOB: [birth year] DOA: 05/17/2014 PCP: Gwendolyn Grant, MD  Subjective/ 24 H Interval events - complains of left sided back pain, pleuritic and with a "burning" component  Assessment/Plan: Hypertension - Pt is on amlodipine, coreg, and imdur. Chest pain/and NSTEMI - Cardiology recommended further work up which can be done as outpatient. Plan is to continue ASA, B blocker, statin. Anticoagulation held in setting of severe HTN.  - Patient is currently on aspirin, beta blocker, statin. No ACE inhibitor due to renal dysfunction. Anticoagulation held in setting of severe hypertension.  - chest pain resolved.  Liver cirrhosis - initially fatty liver, progressed based on current [place] Acute on chronic kidney failure - Most likely related to end organ damage from HTN, needs HD now - vascular surgery seeing, placement of a graft 7/30 Diabetes mellitus  - Continue sliding scale insulin.  CAP - Pt received appropriate antibiotic course while in house.  - resolving.  Abdominal discomfort - resolving. And most likely due to constipation.   Diet: renal  Fluids: none DVT Prophylaxis: heparin  Code Status: Full Family Communication: family at bedside  Disposition Plan: home when ready   Consultants:  Cardiology   Nephrology  Vascular surgery  Procedures:  None    Antibiotics  Anti-infectives   Start     Dose/Rate Route Frequency Ordered Stop   05/29/14 0600  cefUROXime (ZINACEF) 1.5 g in dextrose 5 % 50 mL IVPB     1.5 g 100 mL/hr over 30 Minutes Intravenous On call to O.R. 05/28/14 1547 05/30/14 0559   05/27/14 0600  cefUROXime (ZINACEF) 1.5 g in dextrose 5 % 50 mL IVPB     1.5 g 100 mL/hr over 30 Minutes Intravenous On call to O.R. 05/26/14 1951 05/28/14 0559   05/21/14 1800  azithromycin (ZITHROMAX) tablet 500 mg  Status:  Discontinued     500 mg Oral Daily 05/21/14 1336 05/24/14 1151   05/18/14 1700  cefTRIAXone (ROCEPHIN) 1 g in  dextrose 5 % 50 mL IVPB  Status:  Discontinued     1 g 100 mL/hr over 30 Minutes Intravenous Every 24 hours 05/17/14 1734 05/24/14 1151   05/17/14 1745  azithromycin (ZITHROMAX) 500 mg in dextrose 5 % 250 mL IVPB  Status:  Discontinued     500 mg 250 mL/hr over 60 Minutes Intravenous Every 24 hours 05/17/14 1734 05/21/14 1336   05/17/14 1600  cefTRIAXone (ROCEPHIN) 1 g in dextrose 5 % 50 mL IVPB     1 g 100 mL/hr over 30 Minutes Intravenous  Once 05/17/14 1548 05/17/14 1720   05/17/14 1600  azithromycin (ZITHROMAX) 500 mg in dextrose 5 % 250 mL IVPB  Status:  Discontinued     500 mg 250 mL/hr over 60 Minutes Intravenous  Once 05/17/14 1548 05/17/14 1735     Antibiotics Given (last 72 hours)   None      Studies  Filed Vitals:   05/29/14 0431 05/29/14 0546 05/29/14 0700 05/29/14 0705  BP: 197/68 186/71 186/74 199/76  Pulse: 62  69 66  Temp: 98.4 F (36.9 C)  98.4 F (36.9 C)   TempSrc: Oral  Oral   Resp: 16  16   Height:      Weight:   59.2 kg (130 lb 8.2 oz)   SpO2: 95%  99%     Intake/Output Summary (Last 24 hours) at 05/29/14 0719 Last data filed at 05/29/14 0300  Gross per 24 hour  Intake  120 ml  Output   2745 ml  Net  -2625 ml   Filed Weights   05/28/14 0658 05/28/14 1012 05/29/14 0700  Weight: 61.1 kg (134 lb 11.2 oz) 59.2 kg (130 lb 8.2 oz) 59.2 kg (130 lb 8.2 oz)    Exam:  General:  NAD  Cardiovascular: RRR  Respiratory: CTA biL  Abdomen: soft, non tender  MSK: no edema  Neuro: non focal  Data Reviewed: Basic Metabolic Panel:  Recent Labs Lab 05/24/14 0401 05/25/14 1109 05/26/14 0400 05/27/14 1014 05/27/14 1300 05/28/14 0711  NA 139 142 141 137 142  141 143  K 4.6 4.9 4.9 4.8 4.8  4.8 4.1  CL 104 106 104  --  103  102 102  CO2 22 22 24   --  26  25 28   GLUCOSE 131* 128* 106* 90 82  81 108*  BUN 61* 61* 65*  --  65*  66* 41*  CREATININE 4.09* 4.17* 4.30*  --  4.27*  4.23* 3.47*  CALCIUM 8.5 8.7 8.7  --  8.4  8.4 8.3*  PHOS   --  5.1*  --   --  5.0*  --    Liver Function Tests:  Recent Labs Lab 05/25/14 1109 05/26/14 2102 05/27/14 1300 05/28/14 0711  AST  --   --   --  59*  ALT  --  40*  --  35  ALKPHOS  --   --   --  231*  BILITOT  --   --   --  0.3  PROT  --   --   --  6.4  ALBUMIN 1.8*  --  1.8* 1.9*   No results found for this basename: LIPASE, AMYLASE,  in the last 168 hours No results found for this basename: AMMONIA,  in the last 168 hours CBC:  Recent Labs Lab 05/23/14 0035 05/24/14 0401 05/26/14 0403 05/27/14 1014 05/27/14 1500  WBC 3.9* 3.4* 3.5*  --  3.4*  HGB 9.1* 8.8* 8.6* 8.8* 8.2*  HCT 26.8* 26.2* 25.8* 26.0* 25.5*  MCV 87.9 88.2 89.9  --  90.7  PLT 98* 90* 95*  --  109*   Cardiac Enzymes: No results found for this basename: CKTOTAL, CKMB, CKMBINDEX, TROPONINI,  in the last 168 hours BNP (last 3 results)  Recent Labs  05/17/14 1956  PROBNP 20847.0*   CBG:  Recent Labs Lab 05/27/14 1157 05/27/14 1641 05/27/14 2101 05/28/14 1223 05/28/14 1609  GLUCAP 106* 72 172* 122* 118*    Recent Results (from the past 240 hour(s))  SURGICAL PCR SCREEN     Status: None   Collection Time    05/28/14  9:45 PM      Result Value Ref Range Status   MRSA, PCR NEGATIVE  NEGATIVE Final   Staphylococcus aureus NEGATIVE  NEGATIVE Final   Comment:            The Xpert SA Assay (FDA     approved for NASAL specimens     in patients over 80 years of age),     is one component of     a comprehensive surveillance     program.  Test performance has     been validated by [employer] for patients greater     than or equal to 22 year old.     It is not intended     to diagnose infection nor to     guide or monitor treatment.  Studies: US Abdomen Complete  05/28/2014   CLINICAL DATA:  Abdominal pain and distention  EXAM: ULTRASOUND ABDOMEN COMPLETE  COMPARISON:  05/25/2014  FINDINGS: Gallbladder:  Surgically removed  Common bile duct:  Diameter: 3.2 mm.  Liver:  Coarsened  echogenicity is noted without focal mass. Mild nodularity is noted which may represent early cirrhotic change.  IVC:  No abnormality visualized.  Pancreas:  Visualized portion unremarkable.  Spleen:  Size and appearance within normal limits.  Right Kidney:  Length: 10.2 cm. Slight increased echogenicity is noted consistent with the prior exam. AA knee 7 mm echogenic focus is noted in the upper pole of the right kidney. This may represent a small calculus but was not well visualized on the exam from for 3 days previous.  Left Kidney:  Length: 9.8 cm. Increased echogenicity similar to that seen on the prior exam.  Abdominal aorta:  No aneurysm visualized.  Other findings:  A right-sided pleural effusion is noted. Mild ascites is seen as well.  IMPRESSION: Changes consistent with cirrhosis of the liver with mild ascites.  Small right pleural effusion.  Echogenicity within the cortex of the upper pole of the right kidney of uncertain significance. This was not well appreciated on the recent exam from 3 days previous.   Electronically Signed   By: Inez Catalina M.D.   On: 05/28/2014 13:55    Scheduled Meds: . amLODipine  10 mg Oral Daily  . antiseptic oral rinse  15 mL Mouth Rinse BID  . aspirin EC  325 mg Oral Daily  . atorvastatin  40 mg Oral q1800  . carvedilol  25 mg Oral BID WC  . cefUROXime (ZINACEF)  IV  1.5 g Intravenous On Call to OR  . cloNIDine  0.1 mg Oral TID  . darbepoetin (ARANESP) injection - DIALYSIS  25 mcg Intravenous Once  . feeding supplement (RESOURCE BREEZE)  1 Container Oral BID BM  . furosemide  80 mg Oral BID  . heparin  5,000 Units Subcutaneous 3 times per day  . insulin aspart  0-15 Units Subcutaneous TID WC  . insulin aspart  0-5 Units Subcutaneous QHS  . isosorbide mononitrate  90 mg Oral Daily  . senna-docusate  1 tablet Oral BID  . sodium chloride  3 mL Intravenous Q12H  . timolol  1 drop Right Eye q morning - 10a   Continuous Infusions:   Principal Problem:    Hypertensive emergency Active Problems:   Type II or unspecified type diabetes mellitus with renal manifestations, uncontrolled   HYPERLIPIDEMIA   Chronic kidney disease, stage IV (severe)   CAP (community acquired pneumonia)   Malignant hypertension   Chest pain   Elevated troponin   NSTEMI (non-ST elevated myocardial infarction)   Time spent: 25  This note has been created with Surveyor, quantity. Any transcriptional errors are unintentional.   Marzetta Board, MD [facility] Pager [phone]. If 7 PM - 7 AM, please contact night-coverage at [email or web address], password [email or web address] 05/29/2014, 7:19 AM  LOS: 12 days

## 2014-05-29 NOTE — Anesthesia Preprocedure Evaluation (Addendum)
Anesthesia Evaluation  Patient identified by MRN, date of birth, ID band  Reviewed: Allergy & Precautions, NPO status , Patient's Chart, lab work & pertinent test results, reviewed documented beta blocker date and time   Airway Mallampati: III TM Distance: <3 FB Neck ROM: Limited    Dental  (+) Upper Dentures, Partial Lower   Pulmonary pneumonia -,  breath sounds clear to auscultation        Cardiovascular METS: 3 - Mets hypertension, + Past MI Rhythm:Regular Rate:Normal     Neuro/Psych  Headaches, PSYCHIATRIC DISORDERS Depression    GI/Hepatic GERD-  ,Elevated AST, and ALP   Endo/Other  diabetes, Type 2  Renal/GU ESRF and DialysisRenal diseaseK 4.0  negative genitourinary   Musculoskeletal negative musculoskeletal ROS (+)   Abdominal   Peds  Hematology  (+) Blood dyscrasia, anemia , Thrombocytopenia   Anesthesia Other Findings   Reproductive/Obstetrics                          Anesthesia Physical Anesthesia Plan  ASA: III  Anesthesia Plan: MAC   Post-op Pain Management:    Induction: Intravenous  Airway Management Planned: Simple Face Mask  Additional Equipment: None  Intra-op Plan:   Post-operative Plan:   Informed Consent: I have reviewed the patients History and Physical, chart, labs and discussed the procedure including the risks, benefits and alternatives for the proposed anesthesia with the patient or authorized representative who has indicated his/her understanding and acceptance.     Plan Discussed with: CRNA and Surgeon  Anesthesia Plan Comments:         Anesthesia Quick Evaluation

## 2014-05-29 NOTE — Progress Notes (Signed)
[facility] Progress Note   Subjective- doing well, 3rd HD today  Filed Vitals:   05/29/14 0830 05/29/14 0859 05/29/14 0926 05/29/14 1001  BP: 154/73 175/71 160/76 159/82  Pulse: 59 60 61 63  Temp:    98.2 F (36.8 C)  TempSrc:    Oral  Resp:    16  Height:      Weight:    58.3 kg (128 lb 8.5 oz)  SpO2:    96%   Exam: Awake, speech and judgment/discrimination are better today No jvd  Chest clear bilat RRR no MRG  Abd +ascites, mild, not tense , nontender abd No LE edema Neuro alert and Ox 3 now   Assessment:  1 CKD V- new start to HD, uremic MS changes much better.  3rd HD today and access surgery thereafter. 2 HTN - difficult to control, poor compliance so I am weaning her off of clonidine. Vol removal gradually may help but suspect she will need BP meds as well.  4 Ascites- on exam, hx of liver disease not specified 5 AMS- as above  6 DM 1 since age 9  7 Anemia Hb 8.6, tsat 42%, give darbe 25 ug x 1 8 HPTH phos and Ca ok , no binders needed yet  Plan- HD today and then Sat, she has OP HD set up to start next week on Tuesday.      Kelly Splinter MD  pager [phone]    cell [phone]  05/29/2014, 10:25 AM     Recent Labs Lab 05/24/14 0401 05/25/14 1109  05/27/14 1300 05/28/14 0711 05/29/14 0500  NA 139 142  < > 142  141 143 143  K 4.6 4.9  < > 4.8  4.8 4.1 4.0  CL 104 106  < > 103  102 102 102  CO2 22 22  < > 26  25 28 30   GLUCOSE 131* 128*  < > 82  81 108* 111*  BUN 61* 61*  < > 65*  66* 41* 29*  CREATININE 4.09* 4.17*  < > 4.27*  4.23* 3.47* 3.04*  CALCIUM 8.5 8.7  < > 8.4  8.4 8.3* 8.3*  PHOS  --  5.1*  --  5.0*  --   --   < > = values in this interval not displayed.  Recent Labs Lab 05/25/14 1109 05/26/14 2102 05/27/14 1300 05/28/14 0711  AST  --   --   --  59*  ALT  --  40*  --  35  ALKPHOS  --   --   --  231*  BILITOT  --   --   --  0.3  PROT  --   --   --  6.4  ALBUMIN 1.8*  --  1.8* 1.9*    Recent Labs Lab  05/26/14 0403 05/27/14 1014 05/27/14 1500 05/29/14 0500  WBC 3.5*  --  3.4* 4.1  HGB 8.6* 8.8* 8.2* 8.2*  HCT 25.8* 26.0* 25.5* 25.0*  MCV 89.9  --  90.7 90.3  PLT 95*  --  109* 97*   . amLODipine  10 mg Oral Daily  . antiseptic oral rinse  15 mL Mouth Rinse BID  . aspirin EC  325 mg Oral Daily  . atorvastatin  40 mg Oral q1800  . carvedilol  25 mg Oral BID WC  . cefUROXime (ZINACEF)  IV  1.5 g Intravenous On Call to OR  . cloNIDine  0.1 mg Oral TID  . darbepoetin      .  feeding supplement (RESOURCE BREEZE)  1 Container Oral BID BM  . furosemide  80 mg Oral BID  . heparin  5,000 Units Subcutaneous 3 times per day  . insulin aspart  0-15 Units Subcutaneous TID WC  . insulin aspart  0-5 Units Subcutaneous QHS  . isosorbide mononitrate  90 mg Oral Daily  . senna-docusate  1 tablet Oral BID  . sodium chloride  3 mL Intravenous Q12H  . timolol  1 drop Right Eye q morning - 10a     acetaminophen, acetaminophen, alum & mag hydroxide-simeth, diphenhydrAMINE, labetalol, ondansetron (ZOFRAN) IV, ondansetron, oxyCODONE-acetaminophen

## 2014-05-29 NOTE — H&P (View-Only) (Signed)
Vein map reviewed.  No adequate basilic or cephalic vein for a fistula.  Filed Vitals:   05/28/14 0901 05/28/14 0930 05/28/14 0955 05/28/14 1012  BP: 174/61 92/50 148/67 194/72  Pulse: 59 63 60 60  Temp:    97.7 F (36.5 C)  TempSrc:    Oral  Resp: 20 16 16 16   Height:      Weight:    130 lb 8.2 oz (59.2 kg)  SpO2:    98%    2+ radial and brachial pulse bilaterally  Will plan for left arm AV graft tomorrow by my partner Dr Scot Dock  NPO p midnight Consent Cefuroxime on call  Ruta Hinds, MD [facility] Office: [phone] Pager: [phone]

## 2014-05-29 NOTE — Transfer of Care (Signed)
Immediate Anesthesia Transfer of Care Note  Patient: [name] [name]  Procedure(s) Performed: Procedure(s): INSERTION OF ARTERIOVENOUS (AV) GORE-TEX GRAFT ARM-LEFT (Left)  Patient Location: PACU  Anesthesia Type:MAC  Level of Consciousness: awake, alert  and oriented  Airway & Oxygen Therapy: Patient Spontanous Breathing and Patient connected to face mask oxygen  Post-op Assessment: Report given to PACU RN  Post vital signs: Reviewed and stable  Complications: No apparent anesthesia complications

## 2014-05-29 NOTE — Procedures (Signed)
I was present at this dialysis session, have reviewed the session itself and made  appropriate changes  Rob Lawyer Washabaugh MD (pgr) [phone]    (c) [phone] 05/29/2014, 10:24 AM   

## 2014-05-29 NOTE — Interval H&P Note (Signed)
History and Physical Interval Note:  05/29/2014 11:31 AM  [name] [name]  has presented today for surgery, with the diagnosis of End Stage Renal Disease  The various methods of treatment have been discussed with the patient and family. After consideration of risks, benefits and other options for treatment, the patient has consented to  Procedure(s): INSERTION OF ARTERIOVENOUS (AV) GORE-TEX GRAFT ARM-LEFT (Left) as a surgical intervention .  The patient's history has been reviewed, patient examined, no change in status, stable for surgery.  I have reviewed the patient's chart and labs.  Questions were answered to the patient's satisfaction.     DICKSON,CHRISTOPHER S

## 2014-05-29 NOTE — Anesthesia Postprocedure Evaluation (Signed)
  Anesthesia Post-op Note  Patient: [name] [name]  Procedure(s) Performed: Procedure(s): INSERTION OF ARTERIOVENOUS (AV) GORE-TEX GRAFT ARM-LEFT (Left)  Patient Location: PACU  Anesthesia Type:MAC  Level of Consciousness: awake, alert  and oriented  Airway and Oxygen Therapy: Patient Spontanous Breathing  Post-op Pain: none  Post-op Assessment: Post-op Vital signs reviewed  Post-op Vital Signs: Reviewed  Last Vitals:  Filed Vitals:   05/29/14 1405  BP:   Pulse:   Temp: 36.8 C  Resp:     Complications: No apparent anesthesia complications

## 2014-05-30 ENCOUNTER — Telehealth: Payer: Self-pay | Admitting: Vascular Surgery

## 2014-05-30 LAB — RENAL FUNCTION PANEL
Albumin: 1.8 g/dL — ABNORMAL LOW (ref 3.5–5.2)
Anion gap: 12 (ref 5–15)
BUN: 22 mg/dL (ref 6–23)
CALCIUM: 8 mg/dL — AB (ref 8.4–10.5)
CHLORIDE: 101 meq/L (ref 96–112)
CO2: 29 meq/L (ref 19–32)
CREATININE: 3.21 mg/dL — AB (ref 0.50–1.10)
GFR calc Af Amer: 16 mL/min — ABNORMAL LOW (ref >90)
GFR, EST NON AFRICAN AMERICAN: 13 mL/min — AB (ref >90)
GLUCOSE: 75 mg/dL (ref 70–99)
Phosphorus: 5.1 mg/dL — ABNORMAL HIGH (ref 2.3–4.6)
Potassium: 4.3 mEq/L (ref 3.7–5.3)
Sodium: 142 mEq/L (ref 137–147)

## 2014-05-30 LAB — GLUCOSE, CAPILLARY
GLUCOSE-CAPILLARY: 71 mg/dL (ref 70–99)
Glucose-Capillary: 117 mg/dL — ABNORMAL HIGH (ref 70–99)
Glucose-Capillary: 90 mg/dL (ref 70–99)

## 2014-05-30 NOTE — Progress Notes (Signed)
Physical Therapy Treatment Patient Details Name: [name] [name] MRN: [id number] DOB: 27-Dec-1940 Today's Date: 05/30/2014    History of Present Illness hypertensive emergency    PT Comments    Pt. Now with HD catheter and is receiving HD.  Pt. Had some dizziness while walking and needed to go back to room .  Upon return to sitting on edge of bed, BP was 139/50.  Dizziness subsided once she was in sitting.  I believe pt. Has more generalized weakness than on initial eval of 7/25.  Have updated recommendation for HHPT and RW as she adjusts to HD rigors.  Husband and daughter present and very supportive.  Follow Up Recommendations  Home health PT     Equipment Recommendations  Rolling walker with 5" wheels    Recommendations for Other Services       Precautions / Restrictions Precautions Precautions: Fall Restrictions Weight Bearing Restrictions: No    Mobility  Bed Mobility Overal bed mobility: Modified Independent             General bed mobility comments: managing well with HOB up   Transfers Overall transfer level: Needs assistance Equipment used: Rolling walker (2 wheeled) Transfers: Sit to/from Stand Sit to Stand: Min assist         General transfer comment: min assist for power  up  and for balance  Ambulation/Gait Ambulation/Gait assistance: Min assist Ambulation Distance (Feet): 80 Feet Assistive device: Rolling walker (2 wheeled) Gait Pattern/deviations: WFL(Within Functional Limits);Trunk flexed Gait velocity: decreased   General Gait Details: pt. became dizzy while walking, limiting distance walked   Science writer    Modified Rankin (Stroke Patients Only)       Balance                                    Cognition Arousal/Alertness: Awake/alert Behavior During Therapy: WFL for tasks assessed/performed Overall Cognitive Status: Within Functional Limits for tasks assessed                       Exercises      General Comments        Pertinent Vitals/Pain See vitals tab     Home Living                      Prior Function            PT Goals (current goals can now be found in the care plan section) Progress towards PT goals: Progressing toward goals    Frequency  Min 3X/week    PT Plan Current plan remains appropriate    Co-evaluation             End of Session Equipment Utilized During Treatment: Gait belt Activity Tolerance: Other (comment) (limited by dizziness) Patient left: in bed;with call bell/phone within reach;with family/visitor present     Time: [id number] PT Time Calculation (min): 24 min  Charges:  $Gait Training: 23-37 mins                    G Codes:      Ladona Ridgel 05/30/2014, 3:40 PM Gerlean Ren PT Acute Rehab Services [phone] Beeper [phone]

## 2014-05-30 NOTE — Progress Notes (Signed)
Came to bedside to remind patient that [facility] will follow up post hospital discharge. Consents were obtained when she was at [facility]. Patient continues to agree to follow up. She will receive post hospital discharge call and will be evaluated for monthly home visits.Provided patient with contact information. Made inpatient RNCM aware of bedside visit.  [facility] [phone]

## 2014-05-30 NOTE — Progress Notes (Signed)
Subjective:   Feeling well, mild steal symptoms, s/p L AVG 7/30  Objective Filed Vitals:   05/29/14 1520 05/29/14 1700 05/29/14 2134 05/30/14 0601  BP: 175/49 160/66 145/57 148/55  Pulse: 64 83 83 71  Temp: 97 F (36.1 C) 99.4 F (37.4 C) 99.6 F (37.6 C) 98.6 F (37 C)  TempSrc:  Oral Oral Oral  Resp: 22 22 20 18   Height:      Weight:    52.8 kg (116 lb 6.5 oz)  SpO2: 98% 97% 96% 96%   Physical Exam General: alert and oriented, no acute distress Heart: RRR Lungs: CTA, unlabored Abdomen: soft nontender, ascites. +BS Extremities: no edema Dialysis Access:  Cath, LUA AVG placed 7/30  Assessment/Plan: 1. ESRD - new HD start, tolerating well. AVG placed yesterday, mild steal sx, VVS following. K+4.3. HD pending tomorrow. CLIP'd to [place] on TTS 2nd shift. 2. Anemia - hgb 8.2, aranesp given. Watch cbc 3. Secondary hyperparathyroidism - Ca+8, phos 5.1, phoslo 4. HTN/volume - 148/55, coreg, norvasc, lisinopril 5. Nutrition - alb 1.8, renal diet, multivit, supplements 6. DM -per primary  Shelle Iron, NP [facility] [phone] 05/30/2014,9:39 AM  LOS: 13 days   Pt seen, examined and agree w A/P as above.  Kelly Splinter MD pager [phone]    cell [phone] 05/30/2014, 12:38 PM    Additional Objective Labs: Basic Metabolic Panel:  Recent Labs Lab 05/25/14 1109  05/27/14 1300 05/28/14 0711 05/29/14 0500 05/30/14 0542  NA 142  < > 142  141 143 143 142  K 4.9  < > 4.8  4.8 4.1 4.0 4.3  CL 106  < > 103  102 102 102 101  CO2 22  < > 26  25 28 30 29   GLUCOSE 128*  < > 82  81 108* 111* 75  BUN 61*  < > 65*  66* 41* 29* 22  CREATININE 4.17*  < > 4.27*  4.23* 3.47* 3.04* 3.21*  CALCIUM 8.7  < > 8.4  8.4 8.3* 8.3* 8.0*  PHOS 5.1*  --  5.0*  --   --  5.1*  < > = values in this interval not displayed. Liver Function Tests:  Recent Labs Lab 05/26/14 2102 05/27/14 1300 05/28/14 0711 05/30/14 0542  AST  --   --  59*  --   ALT 40*  --   35  --   ALKPHOS  --   --  231*  --   BILITOT  --   --  0.3  --   PROT  --   --  6.4  --   ALBUMIN  --  1.8* 1.9* 1.8*   No results found for this basename: LIPASE, AMYLASE,  in the last 168 hours CBC:  Recent Labs Lab 05/24/14 0401 05/26/14 0403 05/27/14 1014 05/27/14 1500 05/29/14 0500  WBC 3.4* 3.5*  --  3.4* 4.1  HGB 8.8* 8.6* 8.8* 8.2* 8.2*  HCT 26.2* 25.8* 26.0* 25.5* 25.0*  MCV 88.2 89.9  --  90.7 90.3  PLT 90* 95*  --  109* 97*   Blood Culture    Component Value Date/Time   SDES BLOOD LEFT FOREARM  4 ML IN EACH BOTTLE 05/17/2014 1657   SDES BLOOD RIGHT FOREARM  4 ML IN [facility] BOTTLE 05/17/2014 1657   SPECREQUEST Normal 05/17/2014 1657   SPECREQUEST Normal 05/17/2014 1657   CULT  Value: NO GROWTH 5 DAYS Performed at [facility] 05/17/2014 1657   CULT  Value: NO  GROWTH 5 DAYS Performed at [facility] 05/17/2014 1657   REPTSTATUS 05/23/2014 FINAL 05/17/2014 1657   REPTSTATUS 05/23/2014 FINAL 05/17/2014 1657    Cardiac Enzymes: No results found for this basename: CKTOTAL, CKMB, CKMBINDEX, TROPONINI,  in the last 168 hours CBG:  Recent Labs Lab 05/29/14 1050 05/29/14 1407 05/29/14 1659 05/29/14 2209 05/30/14 0733  GLUCAP 79 97 121* 106* 71   Iron Studies:  Recent Labs  05/27/14 1500  IRON 84  TIBC 202*   @[email or web address] @ Studies/Results: US Abdomen Complete  05/28/2014   CLINICAL DATA:  Abdominal pain and distention  EXAM: ULTRASOUND ABDOMEN COMPLETE  COMPARISON:  05/25/2014  FINDINGS: Gallbladder:  Surgically removed  Common bile duct:  Diameter: 3.2 mm.  Liver:  Coarsened echogenicity is noted without focal mass. Mild nodularity is noted which may represent early cirrhotic change.  IVC:  No abnormality visualized.  Pancreas:  Visualized portion unremarkable.  Spleen:  Size and appearance within normal limits.  Right Kidney:  Length: 10.2 cm. Slight increased echogenicity is noted consistent with the prior exam. AA knee 7 mm echogenic focus is noted  in the upper pole of the right kidney. This may represent a small calculus but was not well visualized on the exam from for 3 days previous.  Left Kidney:  Length: 9.8 cm. Increased echogenicity similar to that seen on the prior exam.  Abdominal aorta:  No aneurysm visualized.  Other findings:  A right-sided pleural effusion is noted. Mild ascites is seen as well.  IMPRESSION: Changes consistent with cirrhosis of the liver with mild ascites.  Small right pleural effusion.  Echogenicity within the cortex of the upper pole of the right kidney of uncertain significance. This was not well appreciated on the recent exam from 3 days previous.   Electronically Signed   By: Inez Catalina M.D.   On: 05/28/2014 13:55   Medications: . sodium chloride 10 mL/hr at 05/29/14 1118   . amLODipine  10 mg Oral Daily  . antiseptic oral rinse  15 mL Mouth Rinse BID  . aspirin EC  325 mg Oral Daily  . atorvastatin  40 mg Oral q1800  . calcium acetate  667 mg Oral TID WC  . carvedilol  25 mg Oral BID WC  . feeding supplement (RESOURCE BREEZE)  1 Container Oral BID BM  . heparin  5,000 Units Subcutaneous 3 times per day  . insulin aspart  0-15 Units Subcutaneous TID WC  . insulin aspart  0-5 Units Subcutaneous QHS  . isosorbide mononitrate  90 mg Oral Daily  . lisinopril  10 mg Oral QPM  . multivitamin  1 tablet Oral QHS  . senna-docusate  1 tablet Oral BID  . sodium chloride  3 mL Intravenous Q12H  . timolol  1 drop Right Eye q morning - 10a

## 2014-05-30 NOTE — Telephone Encounter (Addendum)
sched appt 06/04/14 @ 8:45  Lm on hm# to inform pt of appt  Message copied by Georgiann Mccoy on Fri May 30, 2014  4:11 PM ------      Message from: Mena Goes      Created: Fri May 30, 2014 10:44 AM      Regarding: Schedule                   ----- Message -----         From: Gabriel Earing, PA-C         Sent: 05/30/2014  10:41 AM           To: Vvs Charge Pool            S/p AVG placement yesterday.  She has mild steal sx and [facility] wants to see her back next Wednesday 06/04/14.            Thanks,      Aldona Bar ------

## 2014-05-30 NOTE — Progress Notes (Signed)
PROGRESS NOTE  [name] [name] [id number] DOB: 1941-10-10 DOA: 05/17/2014 PCP: Gwendolyn Grant, MD  Subjective/ 24 H Interval events - no complaints this morning, feeling better, very upbeat about planned discharge tomorrow.   Assessment/Plan: Hypertension - Pt is on amlodipine, coreg, and imdur. Chest pain/and NSTEMI - Cardiology recommended further work up which can be done as outpatient. Plan is to continue ASA, B blocker, statin. Anticoagulation held in setting of severe HTN.  - Patient is currently on aspirin, beta blocker, statin. No ACE inhibitor due to renal dysfunction. Anticoagulation held in setting of severe hypertension.  - chest pain resolved.  - outpatient follow up Liver cirrhosis - initially fatty liver, progressed based on current [place] Acute on chronic kidney failure - Most likely related to end organ damage from HTN, needs HD now - vascular surgery seeing, placement of a graft 7/30 Diabetes mellitus  - Continue sliding scale insulin.  CAP - Pt received appropriate antibiotic course while in house.  - resolving.  Abdominal discomfort - resolving. And most likely due to constipation.   Diet: renal  Fluids: none DVT Prophylaxis: heparin  Code Status: Full Family Communication: family at bedside  Disposition Plan: home when ready   Consultants:  Cardiology   Nephrology  Vascular surgery  Procedures:  None    Antibiotics  Anti-infectives   Start     Dose/Rate Route Frequency Ordered Stop   05/29/14 1201  dextrose 5 % with cefUROXime (ZINACEF) ADS Med    Comments:  Key, Kristopher   : cabinet override      05/29/14 1201 05/30/14 0014   05/29/14 0600  [MAR Hold]  cefUROXime (ZINACEF) 1.5 g in dextrose 5 % 50 mL IVPB     (On MAR Hold since 05/29/14 1055)   1.5 g 100 mL/hr over 30 Minutes Intravenous On call to O.R. 05/28/14 1547 05/29/14 1214   05/27/14 0600  cefUROXime (ZINACEF) 1.5 g in dextrose 5 % 50 mL IVPB     1.5 g 100 mL/hr over 30  Minutes Intravenous On call to O.R. 05/26/14 1951 05/28/14 0559   05/21/14 1800  azithromycin (ZITHROMAX) tablet 500 mg  Status:  Discontinued     500 mg Oral Daily 05/21/14 1336 05/24/14 1151   05/18/14 1700  cefTRIAXone (ROCEPHIN) 1 g in dextrose 5 % 50 mL IVPB  Status:  Discontinued     1 g 100 mL/hr over 30 Minutes Intravenous Every 24 hours 05/17/14 1734 05/24/14 1151   05/17/14 1745  azithromycin (ZITHROMAX) 500 mg in dextrose 5 % 250 mL IVPB  Status:  Discontinued     500 mg 250 mL/hr over 60 Minutes Intravenous Every 24 hours 05/17/14 1734 05/21/14 1336   05/17/14 1600  cefTRIAXone (ROCEPHIN) 1 g in dextrose 5 % 50 mL IVPB     1 g 100 mL/hr over 30 Minutes Intravenous  Once 05/17/14 1548 05/17/14 1720   05/17/14 1600  azithromycin (ZITHROMAX) 500 mg in dextrose 5 % 250 mL IVPB  Status:  Discontinued     500 mg 250 mL/hr over 60 Minutes Intravenous  Once 05/17/14 1548 05/17/14 1735     Antibiotics Given (last 72 hours)   Date/Time Action Medication Dose   05/29/14 1214 Given   [MAR Hold] cefUROXime (ZINACEF) 1.5 g in dextrose 5 % 50 mL IVPB (On MAR Hold since 05/29/14 1055) 1.5 g      Studies  Filed Vitals:   05/29/14 1700 05/29/14 2134 05/30/14 0601 05/30/14 1014  BP: 160/66 145/57  148/55 168/55  Pulse: 83 83 71 72  Temp: 99.4 F (37.4 C) 99.6 F (37.6 C) 98.6 F (37 C) 98 F (36.7 C)  TempSrc: Oral Oral Oral Oral  Resp: 22 20 18 20   Height:      Weight:   52.8 kg (116 lb 6.5 oz)   SpO2: 97% 96% 96% 100%    Intake/Output Summary (Last 24 hours) at 05/30/14 1032 Last data filed at 05/30/14 0252  Gross per 24 hour  Intake    883 ml  Output    300 ml  Net    583 ml   Filed Weights   05/29/14 0700 05/29/14 1001 05/30/14 0601  Weight: 59.2 kg (130 lb 8.2 oz) 58.3 kg (128 lb 8.5 oz) 52.8 kg (116 lb 6.5 oz)    Exam:  General:  NAD  Cardiovascular: RRR  Respiratory: CTA biL  Abdomen: soft, non tender  MSK: no edema  Data Reviewed: Basic Metabolic  Panel:  Recent Labs Lab 05/24/14 0401 05/25/14 1109 05/26/14 0400 05/27/14 1014 05/27/14 1300 05/28/14 0711 05/29/14 0500 05/30/14 0542  NA 139 142 141 137 142  141 143 143 142  K 4.6 4.9 4.9 4.8 4.8  4.8 4.1 4.0 4.3  CL 104 106 104  --  103  102 102 102 101  CO2 22 22 24   --  26  25 28 30 29   GLUCOSE 131* 128* 106* 90 82  81 108* 111* 75  BUN 61* 61* 65*  --  65*  66* 41* 29* 22  CREATININE 4.09* 4.17* 4.30*  --  4.27*  4.23* 3.47* 3.04* 3.21*  CALCIUM 8.5 8.7 8.7  --  8.4  8.4 8.3* 8.3* 8.0*  PHOS  --  5.1*  --   --  5.0*  --   --  5.1*   Liver Function Tests:  Recent Labs Lab 05/25/14 1109 05/26/14 2102 05/27/14 1300 05/28/14 0711 05/30/14 0542  AST  --   --   --  59*  --   ALT  --  40*  --  35  --   ALKPHOS  --   --   --  231*  --   BILITOT  --   --   --  0.3  --   PROT  --   --   --  6.4  --   ALBUMIN 1.8*  --  1.8* 1.9* 1.8*   CBC:  Recent Labs Lab 05/24/14 0401 05/26/14 0403 05/27/14 1014 05/27/14 1500 05/29/14 0500  WBC 3.4* 3.5*  --  3.4* 4.1  HGB 8.8* 8.6* 8.8* 8.2* 8.2*  HCT 26.2* 25.8* 26.0* 25.5* 25.0*  MCV 88.2 89.9  --  90.7 90.3  PLT 90* 95*  --  109* 97*   BNP (last 3 results)  Recent Labs  05/17/14 1956  PROBNP 20847.0*   CBG:  Recent Labs Lab 05/29/14 1050 05/29/14 1407 05/29/14 1659 05/29/14 2209 05/30/14 0733  GLUCAP 79 97 121* 106* 71    Recent Results (from the past 240 hour(s))  SURGICAL PCR SCREEN     Status: None   Collection Time    05/28/14  9:45 PM      Result Value Ref Range Status   MRSA, PCR NEGATIVE  NEGATIVE Final   Staphylococcus aureus NEGATIVE  NEGATIVE Final   Comment:            The Xpert SA Assay (FDA     approved for NASAL specimens     in  patients over 73 years of age),     is one component of     a comprehensive surveillance     program.  Test performance has     been validated by [employer] for patients greater     than or equal to 38 year old.     It is not intended      to diagnose infection nor to     guide or monitor treatment.     Studies: No results found.  Scheduled Meds: . amLODipine  10 mg Oral Daily  . antiseptic oral rinse  15 mL Mouth Rinse BID  . aspirin EC  325 mg Oral Daily  . atorvastatin  40 mg Oral q1800  . calcium acetate  667 mg Oral TID WC  . carvedilol  25 mg Oral BID WC  . feeding supplement (RESOURCE BREEZE)  1 Container Oral BID BM  . heparin  5,000 Units Subcutaneous 3 times per day  . insulin aspart  0-15 Units Subcutaneous TID WC  . insulin aspart  0-5 Units Subcutaneous QHS  . isosorbide mononitrate  90 mg Oral Daily  . lisinopril  10 mg Oral QPM  . multivitamin  1 tablet Oral QHS  . senna-docusate  1 tablet Oral BID  . sodium chloride  3 mL Intravenous Q12H  . timolol  1 drop Right Eye q morning - 10a   Continuous Infusions: . sodium chloride 10 mL/hr at 05/29/14 1118   Principal Problem:   Hypertensive emergency Active Problems:   Type II or unspecified type diabetes mellitus with renal manifestations, uncontrolled   HYPERLIPIDEMIA   Chronic kidney disease, stage IV (severe)   CAP (community acquired pneumonia)   Malignant hypertension   Chest pain   Elevated troponin   NSTEMI (non-ST elevated myocardial infarction)  Time spent: 25  This note has been created with Surveyor, quantity. Any transcriptional errors are unintentional.   Marzetta Board, MD [facility] Pager [phone]. If 7 PM - 7 AM, please contact night-coverage at [email or web address], password [facility] 05/30/2014, 10:32 AM  LOS: 13 days

## 2014-05-30 NOTE — Progress Notes (Signed)
   VASCULAR SURGERY ASSESSMENT AND PLAN:  * 1 Day Post-Op s/p: left upper arm AV graft  *  Mild steal symptoms.  * Will follow. She tells me that she's discharged tomorrow after dialysis. I will arrange for a follow up visit next week to follow her mild steal symptoms.  SUBJECTIVE: No pain in left hand. She does note some mild paresthesias.  PHYSICAL EXAM: Filed Vitals:   05/29/14 1520 05/29/14 1700 05/29/14 2134 05/30/14 0601  BP: 175/49 160/66 145/57 148/55  Pulse: 64 83 83 71  Temp: 97 F (36.1 C) 99.4 F (37.4 C) 99.6 F (37.6 C) 98.6 F (37 C)  TempSrc:  Oral Oral Oral  Resp: 22 22 20 18   Height:      Weight:    116 lb 6.5 oz (52.8 kg)  SpO2: 98% 97% 96% 96%   Good thrill in left upper arm graft. Left radial pulse not palpable. Good ulnar signal with Doppler.  LABS: Lab Results  Component Value Date   WBC 4.1 05/29/2014   HGB 8.2* 05/29/2014   HCT 25.0* 05/29/2014   MCV 90.3 05/29/2014   PLT 97* 05/29/2014   Lab Results  Component Value Date   CREATININE 3.21* 05/30/2014   Lab Results  Component Value Date   INR 1.09 05/20/2014   CBG (last 3)   Recent Labs  05/29/14 1407 05/29/14 1659 05/29/14 2209  GLUCAP 97 121* 106*    Principal Problem:   Hypertensive emergency Active Problems:   Type II or unspecified type diabetes mellitus with renal manifestations, uncontrolled   HYPERLIPIDEMIA   Chronic kidney disease, stage IV (severe)   CAP (community acquired pneumonia)   Malignant hypertension   Chest pain   Elevated troponin   NSTEMI (non-ST elevated myocardial infarction)  Gae Gallop Beeper: [id number] 05/30/2014

## 2014-05-31 LAB — CBC
HCT: 24.2 % — ABNORMAL LOW (ref 36.0–46.0)
Hemoglobin: 7.9 g/dL — ABNORMAL LOW (ref 12.0–15.0)
MCH: 29.6 pg (ref 26.0–34.0)
MCHC: 32.6 g/dL (ref 30.0–36.0)
MCV: 90.6 fL (ref 78.0–100.0)
PLATELETS: 96 10*3/uL — AB (ref 150–400)
RBC: 2.67 MIL/uL — ABNORMAL LOW (ref 3.87–5.11)
RDW: 14.1 % (ref 11.5–15.5)
WBC: 8.1 10*3/uL (ref 4.0–10.5)

## 2014-05-31 LAB — RENAL FUNCTION PANEL
Albumin: 2.1 g/dL — ABNORMAL LOW (ref 3.5–5.2)
Anion gap: 11 (ref 5–15)
BUN: 39 mg/dL — ABNORMAL HIGH (ref 6–23)
CALCIUM: 8.4 mg/dL (ref 8.4–10.5)
CO2: 27 meq/L (ref 19–32)
CREATININE: 4.95 mg/dL — AB (ref 0.50–1.10)
Chloride: 97 mEq/L (ref 96–112)
GFR, EST AFRICAN AMERICAN: 9 mL/min — AB (ref >90)
GFR, EST NON AFRICAN AMERICAN: 8 mL/min — AB (ref >90)
Glucose, Bld: 144 mg/dL — ABNORMAL HIGH (ref 70–99)
PHOSPHORUS: 4.5 mg/dL (ref 2.3–4.6)
Potassium: 4.1 mEq/L (ref 3.7–5.3)
SODIUM: 135 meq/L — AB (ref 137–147)

## 2014-05-31 LAB — GLUCOSE, CAPILLARY: Glucose-Capillary: 95 mg/dL (ref 70–99)

## 2014-05-31 MED ORDER — SORBITOL 70 % SOLN
30.0000 mL | Freq: Once | Status: AC
  Administered 2014-05-31: 30 mL via ORAL
  Filled 2014-05-31: qty 30

## 2014-05-31 MED ORDER — CARVEDILOL 25 MG PO TABS: 25.0000 mg | ORAL_TABLET | Freq: Two times a day (BID) | ORAL | Status: DC

## 2014-05-31 MED ORDER — RENA-VITE PO TABS: 1.0000 | ORAL_TABLET | Freq: Every day | ORAL | Status: DC

## 2014-05-31 MED ORDER — DOCUSATE SODIUM 100 MG PO CAPS
100.0000 mg | ORAL_CAPSULE | Freq: Two times a day (BID) | ORAL | Status: DC | PRN
  Filled 2014-05-31: qty 1

## 2014-05-31 MED ORDER — ATORVASTATIN CALCIUM 40 MG PO TABS: 40.0000 mg | ORAL_TABLET | Freq: Every day | ORAL | Status: DC

## 2014-05-31 MED ORDER — BISACODYL 10 MG RE SUPP
10.0000 mg | Freq: Once | RECTAL | Status: AC
Start: 2014-05-31 — End: 2014-05-31
  Administered 2014-05-31: 10 mg via RECTAL
  Filled 2014-05-31: qty 1

## 2014-05-31 MED ORDER — LISINOPRIL 10 MG PO TABS: 10.0000 mg | ORAL_TABLET | Freq: Every evening | ORAL | Status: DC

## 2014-05-31 MED ORDER — ASPIRIN 325 MG PO TBEC: 325.0000 mg | DELAYED_RELEASE_TABLET | Freq: Every day | ORAL | Status: DC

## 2014-05-31 MED ORDER — OXYCODONE-ACETAMINOPHEN 5-325 MG PO TABS: 1.0000 | ORAL_TABLET | ORAL | Status: DC | PRN

## 2014-05-31 MED ORDER — ISOSORBIDE MONONITRATE ER 30 MG PO TB24: 90.0000 mg | ORAL_TABLET | Freq: Every day | ORAL | Status: DC

## 2014-05-31 MED ORDER — CALCIUM ACETATE 667 MG PO CAPS: 667.0000 mg | ORAL_CAPSULE | Freq: Three times a day (TID) | ORAL | Status: DC

## 2014-05-31 NOTE — Discharge Summary (Signed)
Physician Discharge Summary  ELNETA MESKO [id number] DOB: May 31, 1941 DOA: 05/17/2014  PCP: Gwendolyn Grant, MD  Admit date: 05/17/2014 Discharge date: 05/31/2014  Time spent: 60 minutes  Recommendations for Outpatient Follow-up:  1. Followup with her primary care provider in one to 2 weeks 2. Followup with nephrology for hemodialysis as scheduled 3. Followup with cardiology as an outpatient   Discharge Diagnoses:  Principal Problem:   Hypertensive emergency Active Problems:   Type II or unspecified type diabetes mellitus with renal manifestations, uncontrolled   HYPERLIPIDEMIA   Chronic kidney disease, stage IV (severe)   CAP (community acquired pneumonia)   Malignant hypertension   Chest pain   Elevated troponin   NSTEMI (non-ST elevated myocardial infarction)  Discharge Condition: stable  Diet recommendation: renal  Filed Weights   05/30/14 0601 05/31/14 0740 05/31/14 1107  Weight: 52.8 kg (116 lb 6.5 oz) 53.7 kg (118 lb 6.2 oz) 57.1 kg (125 lb 14.1 oz)   History of present illness:  [name] [name] is a 73 y.o. female with a past medical history of medication nonadherence, Type II Diabetes Mellitus, Uncontrolled Hypertension, Chronic Hepatitis B, presenting to the emergency room at [facility] with complaints of Nausea/Vomiting/Diarrhea that started early this morning at approximately 2:30 am. This was accompanied by precordial chest pain characterized at nonradiating, sharp/stabbing, that has resolved. She also complains of having a nonproductive cough, generalize weakness, chills, malaise, and overall feeling ill. She denies bloody stools,melena, hematemesis, dysuria, syncope, palpitations. Family present at bedsdie report she has not been acting "herself." In the emergency room she was found to be hypertensive with SBP's in the 240's. Labs revealed an elevated troponin of 3.3, EKG did not show ST segment changes. Her kidney function also worsening with  creatinine increasing to 4.2 from 3.23 on 02/10/2014. Cardiology evaluated patient in the emergency department, felt this could demand ischemia. Did not recommend anticoagulation at this time.   Hospital Course:  Chest pain/and NSTEMI - The patient was admitted with chest pain and troponin elevation, cardiology was consulted in the emergency room and thought that the NSTEMI was due to type II demand ischemia from very elevated blood pressure in the setting of acute renal failure. Her troponin peaked to 4.6. She underwent a 2-D echo which showed normal LV function and no wall motion abnormalities. She was medically managed and her chest pain has improved and troponins have trended down with blood pressure control, and she needs cardiology followup as an outpatient for further evaluation.  Hypertensive emergency - on admission agents blood pressure initial systolic was in the 0000000 with a map of 160s with evidence of end organ damage with troponin elevation and renal failure and was started on a nitroglycerin drip. Even with this she was difficult to control needing a Cardene drip plus PRN IV Lopressor pushes. Her blood pressure was eventually controlled, and she has been stable for several days prior to discharge on amlodipine 10 mg daily, carvedilol 25 mg twice a day and lisinopril 10 mg daily, as well as ongoing fluid control with hemodialysis. Acute on chronic renal failure - patient was admitted with a creatinine of 4.2 which has not improved initially. Nephrology has been consulted and given her persistent uremic symptoms of confusion, nausea, vomiting, she was started on hemodialysis during this hospitalization via a dialysis catheter. Vascular surgery has been consulted and patient underwent vein mapping followed by insertion of arteriovenous graft in the left arm. The procedure was without complications, patient had mild  steal symptoms and vascular surgery will have close followup as an outpatient next  week. She was arranged to undergo dialysis as an outpatient prior to discharge. Liver cirrhosis - initially fatty liver, progressed based on current [place]. Patient had mild ascites on ultrasound,  asymptomatic, no paracentesis was performed.  Diabetes mellitus - well controlled, hemoglobin A1c was 5.7 CAP - chest x-ray on admission showed a dense lower lobe airspace disease, patient was started on IV azithromycin and ceftriaxone, and she completed a course of antibiotics while in the hospital. Her blood cultures and all other cultures have remained negative. Patient's respiratory status was at baseline, comfortable on room air upon discharge.  Abdominal discomfort - resolving. And most likely due to constipation.   Procedures:  2D echo Study Conclusions - Left ventricle: The cavity size was normal. Wall thickness was increased in a pattern of mild LVH. There was focal basal hypertrophy. Systolic function was normal. The estimated ejection fraction was in the range of 60% to 65%. Wall motion was normal; there were no regional wall motion abnormalities. There was an increased relative contribution of atrial contraction to ventricular filling. Doppler parameters are consistent with abnormal left ventricular relaxation (grade 1 diastolic dysfunction). - Left atrium: The atrium was mildly dilated.  Consultations:  Cardiology  Nephrology  Vascular surgery  Discharge Exam: Filed Vitals:   05/31/14 0930 05/31/14 0958 05/31/14 1026 05/31/14 1107  BP: 144/78 147/49 142/59 155/59  Pulse: 70 64 69 70  Temp:    98 F (36.7 C)  TempSrc:    Oral  Resp:      Height:      Weight:    57.1 kg (125 lb 14.1 oz)  SpO2:    97%    General: No apparent distress Cardiovascular: Regular rate and rhythm Respiratory: Clear to auscultation bilaterally  Discharge Instructions     Medication List    STOP taking these medications       hydrochlorothiazide 25 MG tablet  Commonly known as:  HYDRODIURIL       metoprolol succinate 100 MG 24 hr tablet  Commonly known as:  TOPROL-XL      TAKE these medications       acetaminophen 500 MG tablet  Commonly known as:  TYLENOL  Take 500 mg by mouth every 6 (six) hours as needed for mild pain or headache.     amLODipine 10 MG tablet  Commonly known as:  NORVASC  Take 1 tablet (10 mg total) by mouth daily.     aspirin 325 MG EC tablet  Take 1 tablet (325 mg total) by mouth daily.     atorvastatin 40 MG tablet  Commonly known as:  LIPITOR  Take 1 tablet (40 mg total) by mouth daily at 6 PM.     calcium acetate 667 MG capsule  Commonly known as:  PHOSLO  Take 1 capsule (667 mg total) by mouth 3 (three) times daily with meals.     carvedilol 25 MG tablet  Commonly known as:  COREG  Take 1 tablet (25 mg total) by mouth 2 (two) times daily with a meal.     furosemide 40 MG tablet  Commonly known as:  LASIX  Take 40 mg by mouth daily.     isosorbide mononitrate 30 MG 24 hr tablet  Commonly known as:  IMDUR  Take 3 tablets (90 mg total) by mouth daily.     lisinopril 10 MG tablet  Commonly known as:  PRINIVIL,ZESTRIL  Take 1  tablet (10 mg total) by mouth every evening.     multivitamin Tabs tablet  Take 1 tablet by mouth at bedtime.     oxyCODONE-acetaminophen 5-325 MG per tablet  Commonly known as:  PERCOCET/ROXICET  Take 1-2 tablets by mouth every 4 (four) hours as needed for severe pain.     timolol 0.5 % ophthalmic solution  Commonly known as:  BETIMOL  Place 1 drop into the right eye every morning.     Vitamin D3 2000 UNITS Chew  Chew 1 tablet by mouth daily.           Follow-up Information   Follow up with Angelia Mould, MD On 06/04/2014. (Office will call you to arrange your appt (sent))    Specialty:  Vascular Surgery   Contact information:   [place] [address] [phone]       Follow up with Gwendolyn Grant, MD. Schedule an appointment as soon as possible for a visit in 2 weeks.    Specialty:  Internal Medicine   Contact information:   [address] [place] [address] [phone]       Follow up with Leonie Man, MD. Schedule an appointment as soon as possible for a visit in 2 weeks.   Specialty:  Cardiology   Contact information:   [address] [place] [address] [phone]     The results of significant diagnostics from this hospitalization (including imaging, microbiology, ancillary and laboratory) are listed below for reference.    Significant Diagnostic Studies: Dg Chest 2 View  05/17/2014   CLINICAL DATA:  Shortness of breath. Abdominal pain with nausea and vomiting. Current history of hypertension and diabetes.  EXAM: CHEST  2 VIEW  COMPARISON:  02/09/2007, 09/12/2006.  FINDINGS: AP semi-erect and lateral images were obtained. Moderate cardiac enlargement, with significant interval increase in the heart size since 2008, even allowing for differences in technique. Pulmonary venous hypertension and likely minimal interstitial pulmonary edema. Airspace consolidation in the left lower lobe with silhouetting of the left hemidiaphragm. No confluent airspace consolidation elsewhere. Small bilateral pleural effusions. Degenerative changes involving the thoracic spine.  IMPRESSION: 1. Moderate cardiac enlargement with significant interval increase in heart size since 2008. 2. Pulmonary venous hypertension with perhaps minimal/incipient pulmonary edema. 3. Dense left lower lobe atelectasis versus pneumonia. Pneumonia is favored. 4. Small bilateral pleural effusions.   Electronically Signed   By: Evangeline Dakin M.D.   On: 05/17/2014 15:24   US Abdomen Complete  05/28/2014   CLINICAL DATA:  Abdominal pain and distention  EXAM: ULTRASOUND ABDOMEN COMPLETE  COMPARISON:  05/25/2014  FINDINGS: Gallbladder:  Surgically removed  Common bile duct:  Diameter: 3.2 mm.  Liver:  Coarsened echogenicity is noted without focal mass.  Mild nodularity is noted which may represent early cirrhotic change.  IVC:  No abnormality visualized.  Pancreas:  Visualized portion unremarkable.  Spleen:  Size and appearance within normal limits.  Right Kidney:  Length: 10.2 cm. Slight increased echogenicity is noted consistent with the prior exam. AA knee 7 mm echogenic focus is noted in the upper pole of the right kidney. This may represent a small calculus but was not well visualized on the exam from for 3 days previous.  Left Kidney:  Length: 9.8 cm. Increased echogenicity similar to that seen on the prior exam.  Abdominal aorta:  No aneurysm visualized.  Other findings:  A right-sided pleural effusion is noted. Mild ascites is seen  as well.  IMPRESSION: Changes consistent with cirrhosis of the liver with mild ascites.  Small right pleural effusion.  Echogenicity within the cortex of the upper pole of the right kidney of uncertain significance. This was not well appreciated on the recent exam from 3 days previous.   Electronically Signed   By: Inez Catalina M.D.   On: 05/28/2014 13:55   US Renal  05/25/2014   CLINICAL DATA:  Renal failure  EXAM: RENAL ULTRASOUND  COMPARISON:  May 02, 2012  FINDINGS: Right Kidney:  Length: 9.1 cm. Right kidney is echogenic. The right renal cortical thickness is low normal. No mass, perinephric fluid, or hydronephrosis visualized. No sonographically demonstrable calculus or ureterectasis.  Left Kidney:  Length: 9.5 cm. The left kidney is echogenic. Left renal cortical thickness is within normal limits. No mass, perinephric fluid, or hydronephrosis visualized. No sonographically demonstrable calculus or ureterectasis.  Bladder:  Decompressed and cannot be assessed.  There is mild ascites.  There is a right pleural effusion.  IMPRESSION: Kidneys are echogenic consistent with medical renal disease. No obstructing foci identified. Mild ascites and right pleural effusion noted incidentally.   Electronically Signed   By: Lowella Grip M.D.   On: 05/25/2014 13:06   Dg Chest Port 1 View  05/27/2014   CLINICAL DATA:  Dialysis catheter placement.  EXAM: PORTABLE CHEST - 1 VIEW  COMPARISON:  05/26/2014.  FINDINGS: New RIGHT IJ dialysis catheter is present with the distal tip at the junction of the superior vena cava and RIGHT atrium. Basilar atelectasis. No pneumothorax. No airspace disease or effusion. The cardiopericardial silhouette is upper limits of normal for projection.  IMPRESSION: Uncomplicated new RIGHT IJ dialysis catheter.   Electronically Signed   By: Dereck Ligas M.D.   On: 05/27/2014 11:26   Dg Chest Port 1 View  05/26/2014   CLINICAL DATA:  Hypertension.  Diabetes.  EXAM: PORTABLE CHEST - 1 VIEW  COMPARISON:  05/18/2014.  FINDINGS: Mediastinum and hilar structures are normal. Cardiomegaly with pulmonary vascular prominence. Interim partial clearing of left lower lobe infiltrate. No pleural effusion or pneumothorax. No acute bony abnormality.  IMPRESSION: 1. Stable cardiomegaly with pulmonary vascular prominence. 2. Interim partial clearing of left lower lobe infiltrate.   Electronically Signed   By: Marcello Moores  Register   On: 05/26/2014 15:51   Dg Chest Port 1 View  05/18/2014   CLINICAL DATA:  Shortness of breath. Community acquired pneumonia. Chest pain. Myocardial infarct. Chronic kidney disease.  EXAM: CHEST - 1 VIEW  COMPARISON:  05/17/2014  FINDINGS: Persistent airspace opacity is seen in the left retrocardiac lung base with silhouetting of the left hemidiaphragm. This shows no significant change since previous study. Right lung remains clear. Heart size is stable.  IMPRESSION: No significant change in retrocardiac left lower lobe opacity, suspicious for pneumonia.   Electronically Signed   By: Earle Gell M.D.   On: 05/18/2014 09:27   Dg Abd Portable 2v  05/22/2014   CLINICAL DATA:  Abdominal distention.  No bowel movement for 4 days.  EXAM: PORTABLE ABDOMEN - 2 VIEW  COMPARISON:  None.  FINDINGS: There is a  moderate stool burden in the RIGHT colon. Cholecystectomy clips are present in the right upper quadrant. No free air identified on decubitus view. Atherosclerosis. The bowel gas pattern is nonobstructive. There are no dilated loops of large or small bowel. No pathologic air-fluid levels.  IMPRESSION: Nonobstructive bowel gas pattern. Moderate stool burden. No acute abnormality.   Electronically Signed  By: Dereck Ligas M.D.   On: 05/22/2014 07:07   Microbiology: Recent Results (from the past 240 hour(s))  SURGICAL PCR SCREEN     Status: None   Collection Time    05/28/14  9:45 PM      Result Value Ref Range Status   MRSA, PCR NEGATIVE  NEGATIVE Final   Staphylococcus aureus NEGATIVE  NEGATIVE Final   Comment:            The Xpert SA Assay (FDA     approved for NASAL specimens     in patients over 75 years of age),     is one component of     a comprehensive surveillance     program.  Test performance has     been validated by [employer] for patients greater     than or equal to 43 year old.     It is not intended     to diagnose infection nor to     guide or monitor treatment.   Labs: Basic Metabolic Panel:  Recent Labs Lab 05/25/14 1109  05/27/14 1300 05/28/14 0711 05/29/14 0500 05/30/14 0542 05/31/14 0500  NA 142  < > 142  141 143 143 142 135*  K 4.9  < > 4.8  4.8 4.1 4.0 4.3 4.1  CL 106  < > 103  102 102 102 101 97  CO2 22  < > 26  25 28 30 29 27   GLUCOSE 128*  < > 82  81 108* 111* 75 144*  BUN 61*  < > 65*  66* 41* 29* 22 39*  CREATININE 4.17*  < > 4.27*  4.23* 3.47* 3.04* 3.21* 4.95*  CALCIUM 8.7  < > 8.4  8.4 8.3* 8.3* 8.0* 8.4  PHOS 5.1*  --  5.0*  --   --  5.1* 4.5  < > = values in this interval not displayed. Liver Function Tests:  Recent Labs Lab 05/25/14 1109 05/26/14 2102 05/27/14 1300 05/28/14 0711 05/30/14 0542 05/31/14 0500  AST  --   --   --  59*  --   --   ALT  --  40*  --  35  --   --   ALKPHOS  --   --   --  231*  --   --     BILITOT  --   --   --  0.3  --   --   PROT  --   --   --  6.4  --   --   ALBUMIN 1.8*  --  1.8* 1.9* 1.8* 2.1*   CBC:  Recent Labs Lab 05/26/14 0403 05/27/14 1014 05/27/14 1500 05/29/14 0500 05/31/14 0745  WBC 3.5*  --  3.4* 4.1 8.1  HGB 8.6* 8.8* 8.2* 8.2* 7.9*  HCT 25.8* 26.0* 25.5* 25.0* 24.2*  MCV 89.9  --  90.7 90.3 90.6  PLT 95*  --  109* 97* 96*   BNP: BNP (last 3 results)  Recent Labs  05/17/14 1956  PROBNP 20847.0*   CBG:  Recent Labs Lab 05/29/14 2209 05/30/14 0733 05/30/14 1202 05/30/14 1625 05/31/14 1141  GLUCAP 106* 71 117* 90 95   Signed:  Jenavive Lamboy  [facility] 05/31/2014, 4:06 PM

## 2014-05-31 NOTE — Progress Notes (Signed)
Pt transferred to dialysis, unable to assess

## 2014-05-31 NOTE — Progress Notes (Signed)
Discharge education, medication, follow-up appts, and prescriptions reviewed with pt and her husband, both stated understanding and that they had no questions, IV removed, pt awaiting her lunch tray Rickard Rhymes, RN

## 2014-05-31 NOTE — Progress Notes (Signed)
Pt still with some left hand paresthesia but may be slightly better.  Left hand is warm. +thrill in graft  Ok for d/c has close follow up with Dr Scot Dock next week.  Ruta Hinds, MD [facility] Office: [phone] Pager: [phone]

## 2014-05-31 NOTE — Discharge Instructions (Signed)
° ° °  05/29/2014 [name] [name] [id number] [birth year]  Surgeon(s): Angelia Mould, MD  Procedure(s): INSERTION OF ARTERIOVENOUS (AV) GORE-TEX GRAFT ARM-LEFT  x Do not stick graft for 4 weeks        You were cared for by a hospitalist during your hospital stay. If you have any questions about your discharge medications or the care you received while you were in the hospital after you are discharged, you can call the unit and asked to speak with the hospitalist on call if the hospitalist that took care of you is not available. Once you are discharged, your primary care physician will handle any further medical issues. Please note that NO REFILLS for any discharge medications will be authorized once you are discharged, as it is imperative that you return to your primary care physician (or establish a relationship with a primary care physician if you do not have one) for your aftercare needs so that they can reassess your need for medications and monitor your lab values.     If you do not have a primary care physician, you can call [phone] for a physician referral.  Follow with Primary MD Gwendolyn Grant, MD in 5-7 days   Get CBC, CMP checked by your doctor and again as further instructed.  Get a 2 view Chest X ray done next visit if you had Pneumonia of Lung problems at the [facility] reviewed and adjusted.  Please request your Prim.MD to go over all Hospital Tests and Procedure/Radiological results at the follow up, please get all Hospital records sent to your Prim MD by signing hospital release before you go home.  Activity: As tolerated with Full fall precautions use walker/cane & assistance as needed  Diet: renal  For Heart failure patients - Check your Weight same time everyday, if you gain over 2 pounds, or you develop in leg swelling, experience more shortness of breath or chest pain, call your Primary MD immediately. Follow Cardiac Low Salt Diet and 1.8  lit/day fluid restriction.  Disposition Home  If you experience worsening of your admission symptoms, develop shortness of breath, life threatening emergency, suicidal or homicidal thoughts you must seek medical attention immediately by calling 911 or calling your MD immediately  if symptoms less severe.  You Must read complete instructions/literature along with all the possible adverse reactions/side effects for all the Medicines you take and that have been prescribed to you. Take any new Medicines after you have completely understood and accpet all the possible adverse reactions/side effects.   Do not drive and provide baby sitting services if your were admitted for syncope or siezures until you have seen by Primary MD or a Neurologist and advised to do so again.  Do not drive when taking Pain medications.   Do not take more than prescribed Pain, Sleep and Anxiety Medications  Special Instructions: If you have smoked or chewed Tobacco  in the last 2 yrs please stop smoking, stop any regular Alcohol  and or any Recreational drug use.  Wear Seat belts while driving.

## 2014-05-31 NOTE — Progress Notes (Signed)
Subjective- no c/o's , ready to go home  Objective Filed Vitals:   05/31/14 0930 05/31/14 0958 05/31/14 1026 05/31/14 1107  BP: 144/78 147/49 142/59 155/59  Pulse: 70 64 69 70  Temp:    98 F (36.7 C)  TempSrc:    Oral  Resp:      Height:      Weight:    57.1 kg (125 lb 14.1 oz)  SpO2:    97%   Physical Exam General: alert and oriented, no acute distress Heart: RRR Lungs: CTA, unlabored Abdomen: soft nontender, ascites. +BS Extremities: no edema Dialysis Access:  Cath, LUA AVG placed 7/30  Assessment/Plan: 1. ESRD - new HD start, CLIP'd to [place] on TTS 2nd shift.  Has new AVG access, appreciate VVS assistance 2. Anemia - hgb 8.2, aranesp given. Watch cbc 3. Secondary hyperparathyroidism - Ca+8, phos 5.1, phoslo 4. HTN/volume - 148/55, coreg, norvasc, lisinopril 5. Nutrition - alb 1.8, renal diet, multivit, supplements 6. DM -per primary 7. Dispo- going home today   Kelly Splinter MD pager [phone]    cell [phone] 05/31/2014, 12:22 PM      Additional Objective Labs: Basic Metabolic Panel:  Recent Labs Lab 05/27/14 1300  05/29/14 0500 05/30/14 0542 05/31/14 0500  NA 142  141  < > 143 142 135*  K 4.8  4.8  < > 4.0 4.3 4.1  CL 103  102  < > 102 101 97  CO2 26  25  < > 30 29 27   GLUCOSE 82  81  < > 111* 75 144*  BUN 65*  66*  < > 29* 22 39*  CREATININE 4.27*  4.23*  < > 3.04* 3.21* 4.95*  CALCIUM 8.4  8.4  < > 8.3* 8.0* 8.4  PHOS 5.0*  --   --  5.1* 4.5  < > = values in this interval not displayed. Liver Function Tests:  Recent Labs Lab 05/26/14 2102  05/28/14 0711 05/30/14 0542 05/31/14 0500  AST  --   --  59*  --   --   ALT 40*  --  35  --   --   ALKPHOS  --   --  231*  --   --   BILITOT  --   --  0.3  --   --   PROT  --   --  6.4  --   --   ALBUMIN  --   < > 1.9* 1.8* 2.1*  < > = values in this interval not displayed. No results found for this basename: LIPASE, AMYLASE,  in the last 168 hours CBC:  Recent Labs Lab  05/26/14 0403  05/27/14 1500 05/29/14 0500 05/31/14 0745  WBC 3.5*  --  3.4* 4.1 8.1  HGB 8.6*  < > 8.2* 8.2* 7.9*  HCT 25.8*  < > 25.5* 25.0* 24.2*  MCV 89.9  --  90.7 90.3 90.6  PLT 95*  --  109* 97* 96*  < > = values in this interval not displayed. Blood Culture    Component Value Date/Time   SDES BLOOD LEFT FOREARM  4 ML IN [facility] BOTTLE 05/17/2014 1657   SDES BLOOD RIGHT FOREARM  4 ML IN [facility] BOTTLE 05/17/2014 1657   SPECREQUEST Normal 05/17/2014 1657   SPECREQUEST Normal 05/17/2014 1657   CULT  Value: NO GROWTH 5 DAYS Performed at [facility] 05/17/2014 1657   CULT  Value: NO GROWTH 5 DAYS Performed at [facility] 05/17/2014 1657  REPTSTATUS 05/23/2014 FINAL 05/17/2014 1657   REPTSTATUS 05/23/2014 FINAL 05/17/2014 1657    Cardiac Enzymes: No results found for this basename: CKTOTAL, CKMB, CKMBINDEX, TROPONINI,  in the last 168 hours CBG:  Recent Labs Lab 05/29/14 2209 05/30/14 0733 05/30/14 1202 05/30/14 1625 05/31/14 1141  GLUCAP 106* 71 117* 90 95   Iron Studies: No results found for this basename: IRON, TIBC, TRANSFERRIN, FERRITIN,  in the last 72 hours @lablastinr3 @ Studies/Results: No results found. Medications: . sodium chloride 10 mL/hr at 05/29/14 1118   . amLODipine  10 mg Oral Daily  . antiseptic oral rinse  15 mL Mouth Rinse BID  . aspirin EC  325 mg Oral Daily  . atorvastatin  40 mg Oral q1800  . calcium acetate  667 mg Oral TID WC  . carvedilol  25 mg Oral BID WC  . feeding supplement (RESOURCE BREEZE)  1 Container Oral BID BM  . heparin  5,000 Units Subcutaneous 3 times per day  . insulin aspart  0-15 Units Subcutaneous TID WC  . insulin aspart  0-5 Units Subcutaneous QHS  . isosorbide mononitrate  90 mg Oral Daily  . lisinopril  10 mg Oral QPM  . multivitamin  1 tablet Oral QHS  . senna-docusate  1 tablet Oral BID  . sodium chloride  3 mL Intravenous Q12H  . timolol  1 drop Right Eye q morning - 10a

## 2014-06-02 ENCOUNTER — Encounter (HOSPITAL_COMMUNITY): Payer: Self-pay | Admitting: Vascular Surgery

## 2014-06-03 ENCOUNTER — Encounter: Payer: Self-pay | Admitting: Vascular Surgery

## 2014-06-03 ENCOUNTER — Telehealth: Payer: Self-pay

## 2014-06-03 NOTE — Telephone Encounter (Signed)
Left message on patient's phone for call back.    Tried calling daughter, Dalyce Krabbe.  No answer.  Unable to leave a message.  Mailbox is full.

## 2014-06-04 ENCOUNTER — Encounter: Payer: Medicare HMO | Admitting: Vascular Surgery

## 2014-06-04 NOTE — Telephone Encounter (Signed)
Admitted to [facility]:  05/17/2014 Discharged to Home:  05/31/14  Pt is home. Doing well, just really weak.  Pt states she's so weak, she does not want to come in at this time for hospital follow (has an appt scheduled for 06/16/14 @ 10:30 am--states she would just like to come in then).  Denies chest pain and shortness of breath.  Went to dialysis yesterday (goes T, Th, and Sat.).  Tolerated well.  Husband assists with ADLs and transportation.  No ambulatory aids used.    Medication and allergies:  Reviewed and updated  Husband states they've stopped the HCTZ and Toprol XL 90 day supply/mail order:  [facility] - [place], [place] - [address] Local pharmacy:  [id number] - [place], [place], family history and past surgical hx:  Reviewed and updated

## 2014-06-11 ENCOUNTER — Encounter: Payer: Medicare HMO | Admitting: Vascular Surgery

## 2014-06-16 ENCOUNTER — Telehealth: Payer: Self-pay

## 2014-06-16 ENCOUNTER — Ambulatory Visit (INDEPENDENT_AMBULATORY_CARE_PROVIDER_SITE_OTHER): Payer: Commercial Managed Care - HMO | Admitting: Family Medicine

## 2014-06-16 ENCOUNTER — Telehealth: Payer: Self-pay | Admitting: General Practice

## 2014-06-16 ENCOUNTER — Encounter: Payer: Self-pay | Admitting: Family Medicine

## 2014-06-16 VITALS — BP 146/62 | HR 73 | Temp 98.3°F | Ht 66.0 in | Wt 126.0 lb

## 2014-06-16 DIAGNOSIS — E785 Hyperlipidemia, unspecified: Secondary | ICD-10-CM

## 2014-06-16 DIAGNOSIS — E1129 Type 2 diabetes mellitus with other diabetic kidney complication: Secondary | ICD-10-CM

## 2014-06-16 DIAGNOSIS — E1121 Type 2 diabetes mellitus with diabetic nephropathy: Secondary | ICD-10-CM

## 2014-06-16 DIAGNOSIS — N184 Chronic kidney disease, stage 4 (severe): Secondary | ICD-10-CM

## 2014-06-16 DIAGNOSIS — I10 Essential (primary) hypertension: Secondary | ICD-10-CM

## 2014-06-16 DIAGNOSIS — Z23 Encounter for immunization: Secondary | ICD-10-CM

## 2014-06-16 DIAGNOSIS — N058 Unspecified nephritic syndrome with other morphologic changes: Secondary | ICD-10-CM

## 2014-06-16 LAB — CBC WITH DIFFERENTIAL/PLATELET
Basophils Absolute: 0 10*3/uL (ref 0.0–0.1)
Basophils Relative: 0.6 % (ref 0.0–3.0)
EOS ABS: 0.1 10*3/uL (ref 0.0–0.7)
Eosinophils Relative: 4.2 % (ref 0.0–5.0)
Hemoglobin: 7.4 g/dL — CL (ref 12.0–15.0)
LYMPHS ABS: 0.5 10*3/uL — AB (ref 0.7–4.0)
Lymphocytes Relative: 17.5 % (ref 12.0–46.0)
MCHC: 33.3 g/dL (ref 30.0–36.0)
MCV: 91 fl (ref 78.0–100.0)
MONO ABS: 0.3 10*3/uL (ref 0.1–1.0)
Monocytes Relative: 9 % (ref 3.0–12.0)
Neutro Abs: 2 10*3/uL (ref 1.4–7.7)
Neutrophils Relative %: 68.7 % (ref 43.0–77.0)
PLATELETS: 134 10*3/uL — AB (ref 150.0–400.0)
RBC: 2.46 Mil/uL — ABNORMAL LOW (ref 3.87–5.11)
RDW: 16 % — AB (ref 11.5–15.5)
WBC: 3 10*3/uL — ABNORMAL LOW (ref 4.0–10.5)

## 2014-06-16 LAB — LIPID PANEL
CHOLESTEROL: 154 mg/dL (ref 0–200)
HDL: 49.6 mg/dL (ref >39.00)
LDL Cholesterol: 66 mg/dL (ref 0–99)
NonHDL: 104.4
Total CHOL/HDL Ratio: 3
Triglycerides: 192 mg/dL — ABNORMAL HIGH (ref 0.0–149.0)
VLDL: 38.4 mg/dL (ref 0.0–40.0)

## 2014-06-16 LAB — HEPATIC FUNCTION PANEL
ALT: 23 U/L (ref 0–35)
AST: 42 U/L — ABNORMAL HIGH (ref 0–37)
Albumin: 2.5 g/dL — ABNORMAL LOW (ref 3.5–5.2)
Alkaline Phosphatase: 192 U/L — ABNORMAL HIGH (ref 39–117)
BILIRUBIN TOTAL: 0.5 mg/dL (ref 0.2–1.2)
Bilirubin, Direct: 0 mg/dL (ref 0.0–0.3)
Total Protein: 6.8 g/dL (ref 6.0–8.3)

## 2014-06-16 LAB — BASIC METABOLIC PANEL
BUN: 24 mg/dL — AB (ref 6–23)
CHLORIDE: 97 meq/L (ref 96–112)
CO2: 32 mEq/L (ref 19–32)
CREATININE: 5.3 mg/dL — AB (ref 0.4–1.2)
Calcium: 9 mg/dL (ref 8.4–10.5)
GFR: 10.21 mL/min — AB (ref >60.00)
Glucose, Bld: 137 mg/dL — ABNORMAL HIGH (ref 70–99)
Potassium: 3.6 mEq/L (ref 3.5–5.1)
Sodium: 139 mEq/L (ref 135–145)

## 2014-06-16 NOTE — Telephone Encounter (Signed)
Message left advising [name] that her mother is supposed to continue the Carvedilol.      KP

## 2014-06-16 NOTE — Telephone Encounter (Signed)
Call from the labs reporting a critical Hemoglobin of 7.4 and Hematocrit of 22.4. Please advise     KP

## 2014-06-16 NOTE — Telephone Encounter (Signed)
Its on the list

## 2014-06-16 NOTE — Patient Instructions (Signed)
Food Basics for Chronic Kidney Disease When your kidneys are not working well, they cannot remove waste and excess substances from your blood as effectively as they did before. This can lead to a buildup and imbalance of these substances, which can affect how your body functions. This buildup can also make your kidneys work harder, causing even more damage. You may need to eat less of certain foods that can lead to the buildup of these substances in your body. By making the changes to your diet that are recommended by your dietitian or health care provider, you could possibly help prevent further kidney damage and delay or prevent the need for dialysis. The following information can help give you a basic understanding of these substances and how they affect your bodily functions. The information also gives examples of foods that contain the highest amounts of these substances. WHAT DO I NEED TO KNOW ABOUT SUBSTANCES IN MY FOOD THAT I MAY NEED TO ADJUST? Food adjustments will be different for each person with chronic kidney disease. It is important that you see a dietitian who can help you determine the specific adjustments that you will need to make for each of the following substances: Potassium Potassium affects how steadily your heart beats. If too much potassium builds up in your blood, it can cause an irregular heartbeat or even a heart attack. Examples of foods rich in potassium include:  Milk.  Fruits.  Vegetables. Phosphorus Phosphorus is a mineral found in your bones. A balance between calcium and phosphorous is needed to build and maintain healthy bones. Too much phosphorus pulls calcium from your bones. This can make your bones weak and more likely to break. Too much phosphorus can also make your skin itch. Examples of foods rich in phosphorus include:  Milk and cheese.  Dried beans.  Peas.  Colas.  Nuts and peanut butter. Animal Protein Animal protein helps you make and keep  muscle. It also helps in the repair of your body's cells and tissues. One of the natural breakdown products of protein is a waste product called urea. When your kidneys are not working properly, they cannot remove wastes such as urea like they did before you developed chronic kidney disease. You will likely need to limit the amount of protein you eat to help prevent a buildup of urea in your blood. Examples of animal protein include:  Meat (all types).  Fish and seafood.  Poultry.  Eggs. Sodium Sodium, which is found in salt, helps maintain a healthy balance of fluids in your body. Too much sodium can increase your blood pressure level and have a negative affect on the function of your heart and lungs. Too much sodium also can cause your body to retain too much fluid, making your kidneys work harder. Examples of foods with high levels of sodium include:  Salt seasonings.  Soy sauce.  Cured and processed meats.  Salted crackers and snack foods.  Fast food.  Canned soups and most canned foods. Glucose Glucose provides energy for your body. If you have diabetes mellitus that is not properly controlled, you have too much glucose in your blood. Too much glucose in your blood can worsen the function of your kidneys by damaging small blood vessels. This prevents enough blood flow to your kidneys to give them what they need to work. If you have diabetes mellitus and chronic kidney disease, it is important to maintain your blood glucose at a level recommended by your health care provider. SHOULD I  TAKE A VITAMIN AND MINERAL SUPPLEMENT? Because you may need to avoid eating certain foods, you may not get all of the vitamins and minerals that would normally come from those foods. Your health care provider or dietitian may recommend that you take a supplement to ensure that you get all of the vitamins and minerals that your body needs.  Document Released: 01/07/2003 Document Revised: 03/03/2014  Document Reviewed: 09/13/2013 [facility] Patient Information 2015 [place], [place]. This information is not intended to replace advice given to you by your health care provider. Make sure you discuss any questions you have with your health care provider.

## 2014-06-16 NOTE — Telephone Encounter (Signed)
Pt is a dialysis pt --- send results to [facility]
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
## 2014-06-16 NOTE — Progress Notes (Signed)
Pre visit review using our clinic review tool, if applicable. No additional management support is needed unless otherwise documented below in the visit note. 

## 2014-06-16 NOTE — Telephone Encounter (Signed)
Faxed to [place].     KP

## 2014-06-16 NOTE — Telephone Encounter (Signed)
Pt daughter came to my desk when Dr. Etter Sjogren was in with another pt and wanted to make sure her mom was to be on the Carvedilol. States that this medication was never mentioned during office visit. Please advise.

## 2014-06-17 ENCOUNTER — Telehealth: Payer: Self-pay | Admitting: Family Medicine

## 2014-06-17 NOTE — Telephone Encounter (Signed)
Relevant patient education mailed to patient.  

## 2014-06-24 ENCOUNTER — Encounter: Payer: Self-pay | Admitting: Vascular Surgery

## 2014-06-25 ENCOUNTER — Encounter: Payer: Self-pay | Admitting: Vascular Surgery

## 2014-06-25 ENCOUNTER — Encounter (HOSPITAL_COMMUNITY): Payer: Self-pay | Admitting: Emergency Medicine

## 2014-06-25 ENCOUNTER — Ambulatory Visit (INDEPENDENT_AMBULATORY_CARE_PROVIDER_SITE_OTHER): Payer: Commercial Managed Care - HMO | Admitting: Vascular Surgery

## 2014-06-25 ENCOUNTER — Emergency Department (HOSPITAL_COMMUNITY)
Admission: EM | Admit: 2014-06-25 | Discharge: 2014-06-25 | Disposition: A | Discharge status: Home / Self Care | Payer: Medicare HMO | Attending: Emergency Medicine | Admitting: Emergency Medicine

## 2014-06-25 VITALS — BP 164/47 | HR 65 | Ht 66.0 in | Wt 128.9 lb

## 2014-06-25 DIAGNOSIS — M949 Disorder of cartilage, unspecified: Secondary | ICD-10-CM

## 2014-06-25 DIAGNOSIS — I120 Hypertensive chronic kidney disease with stage 5 chronic kidney disease or end stage renal disease: Secondary | ICD-10-CM | POA: Insufficient documentation

## 2014-06-25 DIAGNOSIS — D649 Anemia, unspecified: Secondary | ICD-10-CM | POA: Diagnosis not present

## 2014-06-25 DIAGNOSIS — E559 Vitamin D deficiency, unspecified: Secondary | ICD-10-CM | POA: Insufficient documentation

## 2014-06-25 DIAGNOSIS — E785 Hyperlipidemia, unspecified: Secondary | ICD-10-CM | POA: Diagnosis not present

## 2014-06-25 DIAGNOSIS — M899 Disorder of bone, unspecified: Secondary | ICD-10-CM | POA: Insufficient documentation

## 2014-06-25 DIAGNOSIS — Z79899 Other long term (current) drug therapy: Secondary | ICD-10-CM | POA: Diagnosis not present

## 2014-06-25 DIAGNOSIS — H409 Unspecified glaucoma: Secondary | ICD-10-CM | POA: Insufficient documentation

## 2014-06-25 DIAGNOSIS — Z8742 Personal history of other diseases of the female genital tract: Secondary | ICD-10-CM | POA: Diagnosis not present

## 2014-06-25 DIAGNOSIS — Z992 Dependence on renal dialysis: Secondary | ICD-10-CM | POA: Diagnosis not present

## 2014-06-25 DIAGNOSIS — N186 End stage renal disease: Secondary | ICD-10-CM

## 2014-06-25 DIAGNOSIS — E109 Type 1 diabetes mellitus without complications: Secondary | ICD-10-CM | POA: Diagnosis not present

## 2014-06-25 DIAGNOSIS — R7989 Other specified abnormal findings of blood chemistry: Secondary | ICD-10-CM | POA: Insufficient documentation

## 2014-06-25 DIAGNOSIS — Z8719 Personal history of other diseases of the digestive system: Secondary | ICD-10-CM | POA: Diagnosis not present

## 2014-06-25 DIAGNOSIS — Z8659 Personal history of other mental and behavioral disorders: Secondary | ICD-10-CM | POA: Insufficient documentation

## 2014-06-25 LAB — CBC WITH DIFFERENTIAL/PLATELET
BASOS PCT: 1 % (ref 0–1)
Basophils Absolute: 0 10*3/uL (ref 0.0–0.1)
EOS ABS: 0.1 10*3/uL (ref 0.0–0.7)
EOS PCT: 3 % (ref 0–5)
HCT: 25.6 % — ABNORMAL LOW (ref 36.0–46.0)
Hemoglobin: 8.4 g/dL — ABNORMAL LOW (ref 12.0–15.0)
LYMPHS ABS: 0.9 10*3/uL (ref 0.7–4.0)
Lymphocytes Relative: 21 % (ref 12–46)
MCH: 30.4 pg (ref 26.0–34.0)
MCHC: 32.8 g/dL (ref 30.0–36.0)
MCV: 92.8 fL (ref 78.0–100.0)
Monocytes Absolute: 0.4 10*3/uL (ref 0.1–1.0)
Monocytes Relative: 9 % (ref 3–12)
Neutro Abs: 2.9 10*3/uL (ref 1.7–7.7)
Neutrophils Relative %: 66 % (ref 43–77)
PLATELETS: 120 10*3/uL — AB (ref 150–400)
RBC: 2.76 MIL/uL — ABNORMAL LOW (ref 3.87–5.11)
RDW: 17.3 % — ABNORMAL HIGH (ref 11.5–15.5)
WBC: 4.3 10*3/uL (ref 4.0–10.5)

## 2014-06-25 LAB — BASIC METABOLIC PANEL
Anion gap: 12 (ref 5–15)
BUN: 28 mg/dL — ABNORMAL HIGH (ref 6–23)
CALCIUM: 9.3 mg/dL (ref 8.4–10.5)
CO2: 28 mEq/L (ref 19–32)
Chloride: 96 mEq/L (ref 96–112)
Creatinine, Ser: 3.51 mg/dL — ABNORMAL HIGH (ref 0.50–1.10)
GFR calc Af Amer: 14 mL/min — ABNORMAL LOW (ref >90)
GFR, EST NON AFRICAN AMERICAN: 12 mL/min — AB (ref >90)
Glucose, Bld: 110 mg/dL — ABNORMAL HIGH (ref 70–99)
Potassium: 4.3 mEq/L (ref 3.7–5.3)
SODIUM: 136 meq/L — AB (ref 137–147)

## 2014-06-25 LAB — TYPE AND SCREEN
ABO/RH(D): B POS
Antibody Screen: NEGATIVE

## 2014-06-25 MED ORDER — SODIUM CHLORIDE 0.9 % IV SOLN
INTRAVENOUS | Status: DC
  Administered 2014-06-25: 10 mL/h via INTRAVENOUS
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
## 2014-06-25 NOTE — Progress Notes (Signed)
   Patient name: [name] [name] MRN: [id number] DOB: 30-Mar-1941 Sex: female  REASON FOR VISIT: follow up after placement of left upper arm AV graft.  HPI: [name] [name] is a 73 y.o. female who had a left upper arm AV graft placed on 05/29/2014. She had some mild steal symptoms and comes in for a follow up visit. She continues to have some paresthesias in the left hand. She otherwise has no specific complaints.          REVIEW OF SYSTEMS: Valu.Nieves ] denotes positive finding; [  ] denotes negative finding  CARDIOVASCULAR:  [ ]  chest pain   [ ]  dyspnea on exertion    CONSTITUTIONAL:  [ ]  fever   [ ]  chills  PHYSICAL EXAM: Filed Vitals:   06/25/14 1034  BP: 164/47  Pulse: 65  Height: 5\' 6"  (1.676 m)  Weight: 128 lb 14.4 oz (58.469 kg)  SpO2: 98%   Body mass index is 20.82 kg/(m^2). GENERAL: The patient is a well-nourished female, in no acute distress. The vital signs are documented above. CARDIOVASCULAR: There is a regular rate and rhythm. PULMONARY: There is good air exchange bilaterally without wheezing or rales. Her left upper arm graft has an excellent bruit and thrill. Her incisions are healing nicely. She has a radial, ulnar, and palm are arch signal with the Doppler.  MEDICAL ISSUES: END-STAGE RENAL DISEASE: Her left upper arm graft is working well. She does have mild steal symptoms. However currently her symptoms appear tolerable and gradually improving. I've asked her to call if her symptoms progress. I've encouraged her to exercise the arm as much as possible. If her steal symptoms progressed she would likely require an upper many arteriogram and possible consideration for a DRIL procedure.  [facility]: [phone]

## 2014-06-25 NOTE — ED Notes (Signed)
Pt presents due to an abnormal lab value.  Sts she received a call from her dialysis clinic this morning and was told that she needs a blood transfusion.  Pt reports she passed out during dialysis yesterday, but they "put her back together."  Denies pain.  Pt has no complaints.

## 2014-06-25 NOTE — Discharge Instructions (Signed)
I talked to Dr Jonnie Finner today and he wants you to keep your dialysis appointment tomorrow. He doesn't think you need a blood transfusion at this time. Return to the ED if you get light headed, dizzy, chest pain, shortness of breath or feel worse.

## 2014-06-27 ENCOUNTER — Other Ambulatory Visit (HOSPITAL_COMMUNITY): Payer: Self-pay | Admitting: Nephrology

## 2014-06-27 ENCOUNTER — Encounter (HOSPITAL_COMMUNITY): Admission: RE | Admit: 2014-06-27 | Payer: Medicare HMO | Source: Ambulatory Visit

## 2014-07-02 ENCOUNTER — Other Ambulatory Visit: Payer: Self-pay | Admitting: Internal Medicine

## 2014-07-16 ENCOUNTER — Institutional Professional Consult (permissible substitution): Payer: Commercial Managed Care - HMO | Admitting: Cardiology

## 2014-07-23 ENCOUNTER — Encounter (HOSPITAL_COMMUNITY): Payer: Self-pay | Admitting: Emergency Medicine

## 2014-07-23 ENCOUNTER — Emergency Department (HOSPITAL_COMMUNITY)
Admission: EM | Admit: 2014-07-23 | Discharge: 2014-07-23 | Disposition: A | Discharge status: Home / Self Care | Payer: Medicare HMO | Attending: Emergency Medicine | Admitting: Emergency Medicine

## 2014-07-23 DIAGNOSIS — I10 Essential (primary) hypertension: Secondary | ICD-10-CM | POA: Diagnosis not present

## 2014-07-23 DIAGNOSIS — H409 Unspecified glaucoma: Secondary | ICD-10-CM | POA: Insufficient documentation

## 2014-07-23 DIAGNOSIS — E559 Vitamin D deficiency, unspecified: Secondary | ICD-10-CM | POA: Diagnosis not present

## 2014-07-23 DIAGNOSIS — Z87448 Personal history of other diseases of urinary system: Secondary | ICD-10-CM | POA: Diagnosis not present

## 2014-07-23 DIAGNOSIS — Z8739 Personal history of other diseases of the musculoskeletal system and connective tissue: Secondary | ICD-10-CM | POA: Diagnosis not present

## 2014-07-23 DIAGNOSIS — E119 Type 2 diabetes mellitus without complications: Secondary | ICD-10-CM | POA: Insufficient documentation

## 2014-07-23 DIAGNOSIS — Z79899 Other long term (current) drug therapy: Secondary | ICD-10-CM | POA: Insufficient documentation

## 2014-07-23 DIAGNOSIS — Z8619 Personal history of other infectious and parasitic diseases: Secondary | ICD-10-CM | POA: Diagnosis not present

## 2014-07-23 DIAGNOSIS — Z8719 Personal history of other diseases of the digestive system: Secondary | ICD-10-CM | POA: Diagnosis not present

## 2014-07-23 DIAGNOSIS — Z8659 Personal history of other mental and behavioral disorders: Secondary | ICD-10-CM | POA: Insufficient documentation

## 2014-07-23 DIAGNOSIS — Z8742 Personal history of other diseases of the female genital tract: Secondary | ICD-10-CM | POA: Diagnosis not present

## 2014-07-23 DIAGNOSIS — E785 Hyperlipidemia, unspecified: Secondary | ICD-10-CM | POA: Diagnosis not present

## 2014-07-23 DIAGNOSIS — Z043 Encounter for examination and observation following other accident: Secondary | ICD-10-CM | POA: Insufficient documentation

## 2014-07-23 DIAGNOSIS — Z992 Dependence on renal dialysis: Secondary | ICD-10-CM | POA: Insufficient documentation
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
## 2014-07-23 NOTE — ED Notes (Signed)
Bed: [facility] Expected date:  Expected time:  Means of arrival:  Comments: 

## 2014-08-20 ENCOUNTER — Institutional Professional Consult (permissible substitution): Payer: Commercial Managed Care - HMO | Admitting: Cardiovascular Disease

## 2014-08-20 ENCOUNTER — Other Ambulatory Visit: Payer: Self-pay | Admitting: Internal Medicine

## 2014-08-20 ENCOUNTER — Telehealth: Payer: Self-pay | Admitting: Family Medicine

## 2014-08-20 NOTE — Telephone Encounter (Signed)
Dr. Etter Sjogren - patient was late to the appointment today with Dr. Johnsie Cancel.   She didn't want to reschedule.  She stated that she is doing OK.

## 2014-08-25 NOTE — Telephone Encounter (Signed)
noted 

## 2014-09-18 ENCOUNTER — Other Ambulatory Visit: Payer: Self-pay | Admitting: Internal Medicine

## 2014-09-25 ENCOUNTER — Other Ambulatory Visit: Payer: Self-pay | Admitting: Internal Medicine

## 2014-10-14 ENCOUNTER — Telehealth: Payer: Self-pay

## 2014-10-14 NOTE — Telephone Encounter (Signed)
Dr. Kellie Simmering rec'd call from Dr. Augustin Coupe.  Per Dr. Kellie Simmering, the pt. will be evaluated by Dr. Augustin Coupe tomorrow, and will call [facility], if needs surgical evaluation.

## 2014-10-14 NOTE — Telephone Encounter (Signed)
Rec'd phone call from nurse at [facility].  Reported the pt. is c/o steal symptoms; c/o left hand/ fingers cold, and has pain @ access site.  Stated Dr. Moshe Cipro is requesting an appt. ASAP.  Dr. Kellie Simmering contacted Dr. Otelia Santee re: recent procedure that was reported as being done by him on 09/24/14.  Dr. Augustin Coupe to further review pt's symptoms and need for evaluation.

## 2014-10-21 ENCOUNTER — Other Ambulatory Visit: Payer: Self-pay | Admitting: Internal Medicine

## 2014-11-02 DIAGNOSIS — N186 End stage renal disease: Secondary | ICD-10-CM | POA: Diagnosis not present

## 2014-11-04 DIAGNOSIS — N186 End stage renal disease: Secondary | ICD-10-CM | POA: Diagnosis not present

## 2014-11-06 DIAGNOSIS — N186 End stage renal disease: Secondary | ICD-10-CM | POA: Diagnosis not present

## 2014-11-08 DIAGNOSIS — N186 End stage renal disease: Secondary | ICD-10-CM | POA: Diagnosis not present

## 2014-11-11 DIAGNOSIS — N186 End stage renal disease: Secondary | ICD-10-CM | POA: Diagnosis not present

## 2014-11-13 DIAGNOSIS — N186 End stage renal disease: Secondary | ICD-10-CM | POA: Diagnosis not present

## 2014-11-15 DIAGNOSIS — N186 End stage renal disease: Secondary | ICD-10-CM | POA: Diagnosis not present

## 2014-11-18 DIAGNOSIS — N186 End stage renal disease: Secondary | ICD-10-CM | POA: Diagnosis not present

## 2014-11-20 DIAGNOSIS — N186 End stage renal disease: Secondary | ICD-10-CM | POA: Diagnosis not present

## 2014-11-22 DIAGNOSIS — N186 End stage renal disease: Secondary | ICD-10-CM | POA: Diagnosis not present

## 2014-11-25 DIAGNOSIS — N186 End stage renal disease: Secondary | ICD-10-CM | POA: Diagnosis not present

## 2014-11-27 DIAGNOSIS — N186 End stage renal disease: Secondary | ICD-10-CM | POA: Diagnosis not present

## 2014-11-29 DIAGNOSIS — N186 End stage renal disease: Secondary | ICD-10-CM | POA: Diagnosis not present

## 2014-11-30 DIAGNOSIS — Z992 Dependence on renal dialysis: Secondary | ICD-10-CM | POA: Diagnosis not present

## 2014-11-30 DIAGNOSIS — N186 End stage renal disease: Secondary | ICD-10-CM | POA: Diagnosis not present

## 2014-12-02 DIAGNOSIS — N186 End stage renal disease: Secondary | ICD-10-CM | POA: Diagnosis not present

## 2014-12-04 DIAGNOSIS — N186 End stage renal disease: Secondary | ICD-10-CM | POA: Diagnosis not present

## 2014-12-06 DIAGNOSIS — N186 End stage renal disease: Secondary | ICD-10-CM | POA: Diagnosis not present

## 2014-12-09 DIAGNOSIS — N186 End stage renal disease: Secondary | ICD-10-CM | POA: Diagnosis not present

## 2014-12-11 DIAGNOSIS — N186 End stage renal disease: Secondary | ICD-10-CM | POA: Diagnosis not present

## 2014-12-13 DIAGNOSIS — N186 End stage renal disease: Secondary | ICD-10-CM | POA: Diagnosis not present

## 2014-12-16 DIAGNOSIS — N186 End stage renal disease: Secondary | ICD-10-CM | POA: Diagnosis not present

## 2014-12-17 ENCOUNTER — Ambulatory Visit: Payer: Commercial Managed Care - HMO | Admitting: Family Medicine

## 2014-12-18 DIAGNOSIS — N186 End stage renal disease: Secondary | ICD-10-CM | POA: Diagnosis not present

## 2014-12-20 DIAGNOSIS — N186 End stage renal disease: Secondary | ICD-10-CM | POA: Diagnosis not present

## 2014-12-22 ENCOUNTER — Encounter: Payer: Self-pay | Admitting: Family Medicine

## 2014-12-22 ENCOUNTER — Ambulatory Visit (INDEPENDENT_AMBULATORY_CARE_PROVIDER_SITE_OTHER): Payer: Commercial Managed Care - HMO | Admitting: Family Medicine

## 2014-12-22 VITALS — BP 140/70 | HR 72 | Temp 99.1°F | Wt 142.0 lb

## 2014-12-22 DIAGNOSIS — E119 Type 2 diabetes mellitus without complications: Secondary | ICD-10-CM

## 2014-12-22 DIAGNOSIS — E785 Hyperlipidemia, unspecified: Secondary | ICD-10-CM | POA: Diagnosis not present

## 2014-12-22 DIAGNOSIS — E1121 Type 2 diabetes mellitus with diabetic nephropathy: Secondary | ICD-10-CM | POA: Diagnosis not present

## 2014-12-22 DIAGNOSIS — N184 Chronic kidney disease, stage 4 (severe): Secondary | ICD-10-CM

## 2014-12-22 DIAGNOSIS — I10 Essential (primary) hypertension: Secondary | ICD-10-CM | POA: Diagnosis not present

## 2014-12-22 LAB — HEMOGLOBIN A1C: Hgb A1c MFr Bld: 5.2 % (ref 4.6–6.5)

## 2014-12-22 MED ORDER — FUROSEMIDE 40 MG PO TABS: 40.0000 mg | ORAL_TABLET | Freq: Every day | ORAL | Status: DC

## 2014-12-22 MED ORDER — AMLODIPINE BESYLATE 10 MG PO TABS: 10.0000 mg | ORAL_TABLET | Freq: Every day | ORAL | Status: DC

## 2014-12-22 MED ORDER — CARVEDILOL 25 MG PO TABS: 25.0000 mg | ORAL_TABLET | Freq: Two times a day (BID) | ORAL | Status: DC

## 2014-12-22 MED ORDER — CALCIUM ACETATE 667 MG PO CAPS: 667.0000 mg | ORAL_CAPSULE | Freq: Two times a day (BID) | ORAL | Status: DC

## 2014-12-22 MED ORDER — ATORVASTATIN CALCIUM 40 MG PO TABS: 40.0000 mg | ORAL_TABLET | Freq: Two times a day (BID) | ORAL | Status: DC

## 2014-12-22 MED ORDER — LISINOPRIL 10 MG PO TABS: 10.0000 mg | ORAL_TABLET | Freq: Every evening | ORAL | Status: DC

## 2014-12-22 MED ORDER — ISOSORBIDE MONONITRATE ER 30 MG PO TB24: 30.0000 mg | ORAL_TABLET | Freq: Two times a day (BID) | ORAL | Status: DC

## 2014-12-22 NOTE — Assessment & Plan Note (Signed)
con't lipitor Check labs 

## 2014-12-22 NOTE — Assessment & Plan Note (Signed)
con't lisinopril stable 

## 2014-12-22 NOTE — Progress Notes (Signed)
Subjective:    Patient ID: [name] [name], female    DOB: Oct 18, 1941, 74 y.o.   MRN: [id number]  HPI  Patient here for f/u DM, htn and cholesterol Pt is in dialysis regularly.   HPI HYPERTENSION  Blood pressure range-not checking   Chest pain- no      Dyspnea- no Lightheadedness- no   Edema- no Other side effects - no   Medication compliance: good Low salt diet- yes  DIABETES  Blood Sugar ranges-not checking because they were running low---  Polyuria- no New Visual problems- no Hypoglycemic symptoms- no Other side effects-no Medication compliance - diet controlled  Last eye exam- due Foot exam- today  HYPERLIPIDEMIA  Medication compliance- good RUQ pain- no  Muscle aches- no Other side effects-no    Past Medical History  Diagnosis Date  . HYPERTENSION   . OSTEOPENIA   . Unspecified vitamin D deficiency   . Headache(784.0)   . POSTMENOPAUSAL STATUS   . Hepatitis B carrier     05/2009: neg Hep C; Hep B: core pos, Surf neg; fatty liver US 8/10 - 7/13  . HYPERLIPIDEMIA   . GLAUCOMA   . GERD   . DIABETES MELLITUS, TYPE I   . DEPRESSION   . Kidney failure     dialysis 3 times per week  . History   Social History  . Marital Status: Married    Spouse Name: N/A  . Number of Children: N/A  . Years of Education: N/A   Occupational History  . Not on file.   Social History Main Topics  . Smoking status: Never Smoker   . 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 Retired Consulting civil engineer  . Alcohol Use: No  . Drug Use: No  . Sexual Activity: Not on file   Other Topics Concern  . Not on file   Social History Narrative     Review of Systems  Constitutional: Negative for activity change, appetite change, fatigue and unexpected weight change.  Respiratory: Negative for cough and shortness of breath.   Cardiovascular: Negative for chest pain and palpitations.  Psychiatric/Behavioral: Negative for  behavioral problems and dysphoric mood. The patient is not nervous/anxious.        Objective:    Physical Exam  Constitutional: She is oriented to person, place, and time. She appears well-developed and well-nourished. No distress.  Eyes: EOM are normal. Pupils are equal, round, and reactive to light.  Neck: Normal range of motion. Neck supple.  Cardiovascular: Normal rate, regular rhythm and normal heart sounds.   No murmur heard. Pulmonary/Chest: Effort normal and breath sounds normal. No respiratory distress. She has no wheezes. She has no rales. She exhibits no tenderness.  Musculoskeletal: She exhibits no edema or tenderness.  Neurological: She is alert and oriented to person, place, and time.  Psychiatric: She has a normal mood and affect. Her behavior is normal. Judgment and thought content normal.  Sensory exam of the foot is normal, tested with the monofilament. Good pulses, no lesions or ulcers, good peripheral pulses.   BP 140/70 mmHg  Pulse 72  Temp(Src) 99.1 F (37.3 C) (Oral)  Wt 142 lb (64.411 kg)  SpO2 99% Wt Readings from Last 3 Encounters:  12/22/14 142 lb (64.411 kg)  06/25/14 128 lb 14.4 oz (58.469 kg)  06/16/14 126 lb (57.153 kg)     Lab Results  Component Value Date   WBC 4.3 06/25/2014   HGB  8.4* 06/25/2014   HCT 25.6* 06/25/2014   PLT 120* 06/25/2014   GLUCOSE 110* 06/25/2014   CHOL 154 06/16/2014   TRIG 192.0* 06/16/2014   HDL 49.60 06/16/2014   LDLDIRECT 196.2 04/26/2013   LDLCALC 66 06/16/2014   ALT 23 06/16/2014   AST 42* 06/16/2014   NA 136* 06/25/2014   K 4.3 06/25/2014   CL 96 06/25/2014   CREATININE 3.51* 06/25/2014   BUN 28* 06/25/2014   CO2 28 06/25/2014   TSH 3.140 05/17/2014   INR 1.09 05/20/2014   HGBA1C 5.7* 05/17/2014   MICROALBUR 174.7* 01/03/2008    No results found.     Assessment & Plan:   Problem List Items Addressed This Visit    Type II diabetes mellitus with nephropathy    Diet controlled Check labs        Relevant Medications   lisinopril (PRINIVIL,ZESTRIL) tablet   atorvastatin (LIPITOR) tablet   Hyperlipidemia LDL goal <70    con't lipitor Check labs      Relevant Medications   lisinopril (PRINIVIL,ZESTRIL) tablet   isosorbide mononitrate (IMDUR) 24 hr tablet   furosemide (LASIX) tablet   carvedilol (COREG) tablet   atorvastatin (LIPITOR) tablet   amLODIpine (NORVASC) tablet   Essential hypertension - Primary    con't lisinopril stable      Relevant Medications   lisinopril (PRINIVIL,ZESTRIL) tablet   isosorbide mononitrate (IMDUR) 24 hr tablet   furosemide (LASIX) tablet   carvedilol (COREG) tablet   atorvastatin (LIPITOR) tablet   amLODIpine (NORVASC) tablet   Other Relevant Orders   Basic metabolic panel   Chronic kidney disease, stage IV (severe)    Per nephrology On dialysis       Other Visit Diagnoses    Hyperlipidemia LDL goal <100        Relevant Medications    lisinopril (PRINIVIL,ZESTRIL) tablet    isosorbide mononitrate (IMDUR) 24 hr tablet    furosemide (LASIX) tablet    carvedilol (COREG) tablet    atorvastatin (LIPITOR) tablet    amLODIpine (NORVASC) tablet    Other Relevant Orders    Hepatic function panel    Lipid panel    Chronic renal disease, stage 4 (severe)        Relevant Medications    isosorbide mononitrate (IMDUR) 24 hr tablet    calcium acetate (PHOSLO) 667 MG capsule    Diabetes mellitus type II, controlled        Relevant Medications    lisinopril (PRINIVIL,ZESTRIL) tablet    atorvastatin (LIPITOR) tablet    Other Relevant Orders    Hemoglobin A1c        [name], DO

## 2014-12-22 NOTE — Progress Notes (Signed)
Pre visit review using our clinic review tool, if applicable. No additional management support is needed unless otherwise documented below in the visit note. 

## 2014-12-22 NOTE — Assessment & Plan Note (Signed)
Diet controlled. Check labs.  

## 2014-12-22 NOTE — Assessment & Plan Note (Signed)
Per nephrology On dialysis

## 2014-12-22 NOTE — Patient Instructions (Signed)
Diabetes and Standards of Medical Care Diabetes is complicated. You may find that your diabetes team includes a dietitian, nurse, diabetes educator, eye doctor, and more. To help everyone know what is going on and to help you get the care you deserve, the following schedule of care was developed to help keep you on track. Below are the tests, exams, vaccines, medicines, education, and plans you will need. HbA1c test This test shows how well you have controlled your glucose over the past 2-3 months. It is used to see if your diabetes management plan needs to be adjusted.   It is performed at least 2 times a year if you are meeting treatment goals.  It is performed 4 times a year if therapy has changed or if you are not meeting treatment goals. Blood pressure test  This test is performed at every routine medical visit. The goal is less than 140/90 mm Hg for most people, but 130/80 mm Hg in some cases. Ask your health care provider about your goal. Dental exam  Follow up with the dentist regularly. Eye exam  If you are diagnosed with type 1 diabetes as a child, get an exam upon reaching the age of 37 years or older and have had diabetes for 3-5 years. Yearly eye exams are recommended after that initial eye exam.  If you are diagnosed with type 1 diabetes as an adult, get an exam within 5 years of diagnosis and then yearly.  If you are diagnosed with type 2 diabetes, get an exam as soon as possible after the diagnosis and then yearly. Foot care exam  Visual foot exams are performed at every routine medical visit. The exams check for cuts, injuries, or other problems with the feet.  A comprehensive foot exam should be done yearly. This includes visual inspection as well as assessing foot pulses and testing for loss of sensation.  Check your feet nightly for cuts, injuries, or other problems with your feet. Tell your health care provider if anything is not healing. Kidney function test (urine  microalbumin)  This test is performed once a year.  Type 1 diabetes: The first test is performed 5 years after diagnosis.  Type 2 diabetes: The first test is performed at the time of diagnosis.  A serum creatinine and estimated glomerular filtration rate (eGFR) test is done once a year to assess the level of chronic kidney disease (CKD), if present. Lipid profile (cholesterol, HDL, LDL, triglycerides)  Performed every 5 years for most people.  The goal for LDL is less than 100 mg/dL. If you are at high risk, the goal is less than 70 mg/dL.  The goal for HDL is 40 mg/dL-50 mg/dL for men and 50 mg/dL-60 mg/dL for women. An HDL cholesterol of 60 mg/dL or higher gives some protection against heart disease.  The goal for triglycerides is less than 150 mg/dL. Influenza vaccine, pneumococcal vaccine, and hepatitis B vaccine  The influenza vaccine is recommended yearly.  It is recommended that people with diabetes who are over 24 years old get the pneumonia vaccine. In some cases, two separate shots may be given. Ask your health care provider if your pneumonia vaccination is up to date.  The hepatitis B vaccine is also recommended for adults with diabetes. Diabetes self-management education  Education is recommended at diagnosis and ongoing as needed. Treatment plan  Your treatment plan is reviewed at every medical visit. Document Released: 08/14/2009 Document Revised: 03/03/2014 Document Reviewed: 03/19/2013 [facility] Patient Information 2015 [place],  [employer]. This information is not intended to replace advice given to you by your health care provider. Make sure you discuss any questions you have with your health care provider.  

## 2014-12-23 ENCOUNTER — Telehealth: Payer: Self-pay | Admitting: *Deleted

## 2014-12-23 DIAGNOSIS — N186 End stage renal disease: Secondary | ICD-10-CM | POA: Diagnosis not present

## 2014-12-23 LAB — BASIC METABOLIC PANEL
BUN: 46 mg/dL — ABNORMAL HIGH (ref 6–23)
CO2: 32 mEq/L (ref 19–32)
Calcium: 9.5 mg/dL (ref 8.4–10.5)
Chloride: 95 mEq/L — ABNORMAL LOW (ref 96–112)
Creatinine, Ser: 6.24 mg/dL (ref 0.40–1.20)
GFR: 8.45 mL/min — AB (ref >60.00)
Glucose, Bld: 88 mg/dL (ref 70–99)
POTASSIUM: 4.3 meq/L (ref 3.5–5.1)
Sodium: 138 mEq/L (ref 135–145)

## 2014-12-23 LAB — HEPATIC FUNCTION PANEL
ALT: 32 U/L (ref 0–35)
AST: 43 U/L — ABNORMAL HIGH (ref 0–37)
Albumin: 3.8 g/dL (ref 3.5–5.2)
Alkaline Phosphatase: 108 U/L (ref 39–117)
BILIRUBIN DIRECT: 0.2 mg/dL (ref 0.0–0.3)
BILIRUBIN TOTAL: 0.6 mg/dL (ref 0.2–1.2)
Total Protein: 7.8 g/dL (ref 6.0–8.3)

## 2014-12-23 LAB — LIPID PANEL
CHOL/HDL RATIO: 3
Cholesterol: 255 mg/dL — ABNORMAL HIGH (ref 0–200)
HDL: 82.9 mg/dL (ref >39.00)
LDL CALC: 154 mg/dL — AB (ref 0–99)
NonHDL: 172.1
TRIGLYCERIDES: 91 mg/dL (ref 0.0–149.0)
VLDL: 18.2 mg/dL (ref 0.0–40.0)

## 2014-12-23 NOTE — Telephone Encounter (Signed)
Received call from [facility] lab with critical labs results:Creat:6.24.GFR:8.45-Results given to Dr. Etter Sjogren by way of fax.//AB/CMA

## 2014-12-25 DIAGNOSIS — N186 End stage renal disease: Secondary | ICD-10-CM | POA: Diagnosis not present

## 2014-12-27 DIAGNOSIS — N186 End stage renal disease: Secondary | ICD-10-CM | POA: Diagnosis not present

## 2014-12-29 DIAGNOSIS — N186 End stage renal disease: Secondary | ICD-10-CM | POA: Diagnosis not present

## 2014-12-29 DIAGNOSIS — Z992 Dependence on renal dialysis: Secondary | ICD-10-CM | POA: Diagnosis not present

## 2014-12-30 DIAGNOSIS — N186 End stage renal disease: Secondary | ICD-10-CM | POA: Diagnosis not present

## 2015-01-01 DIAGNOSIS — N186 End stage renal disease: Secondary | ICD-10-CM | POA: Diagnosis not present

## 2015-01-03 DIAGNOSIS — N186 End stage renal disease: Secondary | ICD-10-CM | POA: Diagnosis not present

## 2015-01-06 DIAGNOSIS — N186 End stage renal disease: Secondary | ICD-10-CM | POA: Diagnosis not present

## 2015-01-08 DIAGNOSIS — N186 End stage renal disease: Secondary | ICD-10-CM | POA: Diagnosis not present

## 2015-01-09 DIAGNOSIS — N186 End stage renal disease: Secondary | ICD-10-CM | POA: Diagnosis not present

## 2015-01-09 DIAGNOSIS — Z992 Dependence on renal dialysis: Secondary | ICD-10-CM | POA: Diagnosis not present

## 2015-01-09 DIAGNOSIS — T82858D Stenosis of vascular prosthetic devices, implants and grafts, subsequent encounter: Secondary | ICD-10-CM | POA: Diagnosis not present

## 2015-01-09 DIAGNOSIS — I871 Compression of vein: Secondary | ICD-10-CM | POA: Diagnosis not present

## 2015-01-10 DIAGNOSIS — N186 End stage renal disease: Secondary | ICD-10-CM | POA: Diagnosis not present

## 2015-01-13 DIAGNOSIS — N186 End stage renal disease: Secondary | ICD-10-CM | POA: Diagnosis not present

## 2015-01-15 DIAGNOSIS — D689 Coagulation defect, unspecified: Secondary | ICD-10-CM | POA: Diagnosis not present

## 2015-01-15 DIAGNOSIS — N2581 Secondary hyperparathyroidism of renal origin: Secondary | ICD-10-CM | POA: Diagnosis not present

## 2015-01-15 DIAGNOSIS — N186 End stage renal disease: Secondary | ICD-10-CM | POA: Diagnosis not present

## 2015-01-17 DIAGNOSIS — D689 Coagulation defect, unspecified: Secondary | ICD-10-CM | POA: Diagnosis not present

## 2015-01-17 DIAGNOSIS — N186 End stage renal disease: Secondary | ICD-10-CM | POA: Diagnosis not present

## 2015-01-17 DIAGNOSIS — N2581 Secondary hyperparathyroidism of renal origin: Secondary | ICD-10-CM | POA: Diagnosis not present

## 2015-01-20 DIAGNOSIS — N186 End stage renal disease: Secondary | ICD-10-CM | POA: Diagnosis not present

## 2015-01-22 DIAGNOSIS — N186 End stage renal disease: Secondary | ICD-10-CM | POA: Diagnosis not present

## 2015-01-24 DIAGNOSIS — N186 End stage renal disease: Secondary | ICD-10-CM | POA: Diagnosis not present

## 2015-01-27 DIAGNOSIS — N186 End stage renal disease: Secondary | ICD-10-CM | POA: Diagnosis not present

## 2015-01-29 DIAGNOSIS — Z992 Dependence on renal dialysis: Secondary | ICD-10-CM | POA: Diagnosis not present

## 2015-01-29 DIAGNOSIS — E1122 Type 2 diabetes mellitus with diabetic chronic kidney disease: Secondary | ICD-10-CM | POA: Diagnosis not present

## 2015-01-29 DIAGNOSIS — N186 End stage renal disease: Secondary | ICD-10-CM | POA: Diagnosis not present

## 2015-01-31 DIAGNOSIS — N186 End stage renal disease: Secondary | ICD-10-CM | POA: Diagnosis not present

## 2015-02-03 DIAGNOSIS — N186 End stage renal disease: Secondary | ICD-10-CM | POA: Diagnosis not present

## 2015-02-05 DIAGNOSIS — N186 End stage renal disease: Secondary | ICD-10-CM | POA: Diagnosis not present

## 2015-02-07 DIAGNOSIS — N186 End stage renal disease: Secondary | ICD-10-CM | POA: Diagnosis not present

## 2015-02-10 DIAGNOSIS — N186 End stage renal disease: Secondary | ICD-10-CM | POA: Diagnosis not present

## 2015-02-12 DIAGNOSIS — N186 End stage renal disease: Secondary | ICD-10-CM | POA: Diagnosis not present

## 2015-02-14 DIAGNOSIS — N186 End stage renal disease: Secondary | ICD-10-CM | POA: Diagnosis not present

## 2015-02-17 DIAGNOSIS — N186 End stage renal disease: Secondary | ICD-10-CM | POA: Diagnosis not present

## 2015-02-19 DIAGNOSIS — N186 End stage renal disease: Secondary | ICD-10-CM | POA: Diagnosis not present

## 2015-02-21 DIAGNOSIS — N186 End stage renal disease: Secondary | ICD-10-CM | POA: Diagnosis not present

## 2015-02-24 DIAGNOSIS — N186 End stage renal disease: Secondary | ICD-10-CM | POA: Diagnosis not present

## 2015-02-25 DIAGNOSIS — Z1231 Encounter for screening mammogram for malignant neoplasm of breast: Secondary | ICD-10-CM | POA: Diagnosis not present

## 2015-02-26 DIAGNOSIS — N186 End stage renal disease: Secondary | ICD-10-CM | POA: Diagnosis not present

## 2015-02-28 DIAGNOSIS — E1122 Type 2 diabetes mellitus with diabetic chronic kidney disease: Secondary | ICD-10-CM | POA: Diagnosis not present

## 2015-02-28 DIAGNOSIS — Z992 Dependence on renal dialysis: Secondary | ICD-10-CM | POA: Diagnosis not present

## 2015-02-28 DIAGNOSIS — N186 End stage renal disease: Secondary | ICD-10-CM | POA: Diagnosis not present

## 2015-03-03 DIAGNOSIS — N186 End stage renal disease: Secondary | ICD-10-CM | POA: Diagnosis not present

## 2015-03-05 DIAGNOSIS — N186 End stage renal disease: Secondary | ICD-10-CM | POA: Diagnosis not present

## 2015-03-07 DIAGNOSIS — N186 End stage renal disease: Secondary | ICD-10-CM | POA: Diagnosis not present

## 2015-03-10 ENCOUNTER — Encounter: Payer: Self-pay | Admitting: Family Medicine

## 2015-03-10 DIAGNOSIS — N186 End stage renal disease: Secondary | ICD-10-CM | POA: Diagnosis not present

## 2015-03-12 DIAGNOSIS — N186 End stage renal disease: Secondary | ICD-10-CM | POA: Diagnosis not present

## 2015-03-14 DIAGNOSIS — N186 End stage renal disease: Secondary | ICD-10-CM | POA: Diagnosis not present

## 2015-03-17 DIAGNOSIS — N186 End stage renal disease: Secondary | ICD-10-CM | POA: Diagnosis not present

## 2015-03-19 DIAGNOSIS — N186 End stage renal disease: Secondary | ICD-10-CM | POA: Diagnosis not present

## 2015-03-21 DIAGNOSIS — N186 End stage renal disease: Secondary | ICD-10-CM | POA: Diagnosis not present

## 2015-03-24 DIAGNOSIS — N186 End stage renal disease: Secondary | ICD-10-CM | POA: Diagnosis not present

## 2015-03-26 DIAGNOSIS — N186 End stage renal disease: Secondary | ICD-10-CM | POA: Diagnosis not present

## 2015-03-28 DIAGNOSIS — N186 End stage renal disease: Secondary | ICD-10-CM | POA: Diagnosis not present

## 2015-03-31 DIAGNOSIS — Z992 Dependence on renal dialysis: Secondary | ICD-10-CM | POA: Diagnosis not present

## 2015-03-31 DIAGNOSIS — E1122 Type 2 diabetes mellitus with diabetic chronic kidney disease: Secondary | ICD-10-CM | POA: Diagnosis not present

## 2015-03-31 DIAGNOSIS — N186 End stage renal disease: Secondary | ICD-10-CM | POA: Diagnosis not present

## 2015-04-02 DIAGNOSIS — N186 End stage renal disease: Secondary | ICD-10-CM | POA: Diagnosis not present

## 2015-04-04 DIAGNOSIS — N186 End stage renal disease: Secondary | ICD-10-CM | POA: Diagnosis not present

## 2015-04-07 DIAGNOSIS — N186 End stage renal disease: Secondary | ICD-10-CM | POA: Diagnosis not present

## 2015-04-09 DIAGNOSIS — N186 End stage renal disease: Secondary | ICD-10-CM | POA: Diagnosis not present

## 2015-04-11 DIAGNOSIS — N186 End stage renal disease: Secondary | ICD-10-CM | POA: Diagnosis not present

## 2015-04-14 DIAGNOSIS — N186 End stage renal disease: Secondary | ICD-10-CM | POA: Diagnosis not present

## 2015-04-16 DIAGNOSIS — N186 End stage renal disease: Secondary | ICD-10-CM | POA: Diagnosis not present

## 2015-04-18 DIAGNOSIS — N186 End stage renal disease: Secondary | ICD-10-CM | POA: Diagnosis not present

## 2015-04-21 DIAGNOSIS — N186 End stage renal disease: Secondary | ICD-10-CM | POA: Diagnosis not present

## 2015-04-23 DIAGNOSIS — N186 End stage renal disease: Secondary | ICD-10-CM | POA: Diagnosis not present

## 2015-04-24 DIAGNOSIS — N186 End stage renal disease: Secondary | ICD-10-CM | POA: Diagnosis not present

## 2015-04-28 DIAGNOSIS — N186 End stage renal disease: Secondary | ICD-10-CM | POA: Diagnosis not present

## 2015-04-30 DIAGNOSIS — N186 End stage renal disease: Secondary | ICD-10-CM | POA: Diagnosis not present

## 2015-04-30 DIAGNOSIS — E1122 Type 2 diabetes mellitus with diabetic chronic kidney disease: Secondary | ICD-10-CM | POA: Diagnosis not present

## 2015-04-30 DIAGNOSIS — Z992 Dependence on renal dialysis: Secondary | ICD-10-CM | POA: Diagnosis not present

## 2015-05-01 DIAGNOSIS — N186 End stage renal disease: Secondary | ICD-10-CM | POA: Diagnosis not present

## 2015-05-05 DIAGNOSIS — N186 End stage renal disease: Secondary | ICD-10-CM | POA: Diagnosis not present

## 2015-05-07 DIAGNOSIS — N186 End stage renal disease: Secondary | ICD-10-CM | POA: Diagnosis not present

## 2015-05-08 DIAGNOSIS — N186 End stage renal disease: Secondary | ICD-10-CM | POA: Diagnosis not present

## 2015-05-12 DIAGNOSIS — N186 End stage renal disease: Secondary | ICD-10-CM | POA: Diagnosis not present

## 2015-05-14 DIAGNOSIS — N186 End stage renal disease: Secondary | ICD-10-CM | POA: Diagnosis not present

## 2015-05-16 DIAGNOSIS — N186 End stage renal disease: Secondary | ICD-10-CM | POA: Diagnosis not present

## 2015-05-19 DIAGNOSIS — N186 End stage renal disease: Secondary | ICD-10-CM | POA: Diagnosis not present

## 2015-05-20 DIAGNOSIS — Z992 Dependence on renal dialysis: Secondary | ICD-10-CM | POA: Diagnosis not present

## 2015-05-20 DIAGNOSIS — T82858D Stenosis of vascular prosthetic devices, implants and grafts, subsequent encounter: Secondary | ICD-10-CM | POA: Diagnosis not present

## 2015-05-20 DIAGNOSIS — N186 End stage renal disease: Secondary | ICD-10-CM | POA: Diagnosis not present

## 2015-05-20 DIAGNOSIS — I871 Compression of vein: Secondary | ICD-10-CM | POA: Diagnosis not present

## 2015-05-21 DIAGNOSIS — N186 End stage renal disease: Secondary | ICD-10-CM | POA: Diagnosis not present

## 2015-05-23 DIAGNOSIS — N186 End stage renal disease: Secondary | ICD-10-CM | POA: Diagnosis not present

## 2015-05-26 DIAGNOSIS — N186 End stage renal disease: Secondary | ICD-10-CM | POA: Diagnosis not present

## 2015-05-28 DIAGNOSIS — N186 End stage renal disease: Secondary | ICD-10-CM | POA: Diagnosis not present

## 2015-05-30 DIAGNOSIS — N186 End stage renal disease: Secondary | ICD-10-CM | POA: Diagnosis not present

## 2015-05-31 DIAGNOSIS — N186 End stage renal disease: Secondary | ICD-10-CM | POA: Diagnosis not present

## 2015-05-31 DIAGNOSIS — E1122 Type 2 diabetes mellitus with diabetic chronic kidney disease: Secondary | ICD-10-CM | POA: Diagnosis not present

## 2015-05-31 DIAGNOSIS — Z992 Dependence on renal dialysis: Secondary | ICD-10-CM | POA: Diagnosis not present

## 2015-06-02 DIAGNOSIS — N186 End stage renal disease: Secondary | ICD-10-CM | POA: Diagnosis not present

## 2015-06-04 DIAGNOSIS — N186 End stage renal disease: Secondary | ICD-10-CM | POA: Diagnosis not present

## 2015-06-06 DIAGNOSIS — N186 End stage renal disease: Secondary | ICD-10-CM | POA: Diagnosis not present

## 2015-06-09 DIAGNOSIS — N186 End stage renal disease: Secondary | ICD-10-CM | POA: Diagnosis not present

## 2015-06-11 DIAGNOSIS — N186 End stage renal disease: Secondary | ICD-10-CM | POA: Diagnosis not present

## 2015-06-13 DIAGNOSIS — N186 End stage renal disease: Secondary | ICD-10-CM | POA: Diagnosis not present

## 2015-06-15 DIAGNOSIS — N186 End stage renal disease: Secondary | ICD-10-CM | POA: Diagnosis not present

## 2015-06-17 DIAGNOSIS — N186 End stage renal disease: Secondary | ICD-10-CM | POA: Diagnosis not present

## 2015-06-19 DIAGNOSIS — N186 End stage renal disease: Secondary | ICD-10-CM | POA: Diagnosis not present

## 2015-06-22 ENCOUNTER — Ambulatory Visit: Payer: Commercial Managed Care - HMO | Admitting: Family Medicine

## 2015-06-22 DIAGNOSIS — N186 End stage renal disease: Secondary | ICD-10-CM | POA: Diagnosis not present

## 2015-06-24 DIAGNOSIS — N186 End stage renal disease: Secondary | ICD-10-CM | POA: Diagnosis not present

## 2015-06-26 DIAGNOSIS — N186 End stage renal disease: Secondary | ICD-10-CM | POA: Diagnosis not present

## 2015-06-29 DIAGNOSIS — N186 End stage renal disease: Secondary | ICD-10-CM | POA: Diagnosis not present

## 2015-07-01 DIAGNOSIS — E1122 Type 2 diabetes mellitus with diabetic chronic kidney disease: Secondary | ICD-10-CM | POA: Diagnosis not present

## 2015-07-01 DIAGNOSIS — N186 End stage renal disease: Secondary | ICD-10-CM | POA: Diagnosis not present

## 2015-07-01 DIAGNOSIS — Z992 Dependence on renal dialysis: Secondary | ICD-10-CM | POA: Diagnosis not present

## 2015-07-03 DIAGNOSIS — N186 End stage renal disease: Secondary | ICD-10-CM | POA: Diagnosis not present

## 2015-07-03 DIAGNOSIS — E8770 Fluid overload, unspecified: Secondary | ICD-10-CM | POA: Diagnosis not present

## 2015-07-06 DIAGNOSIS — E8770 Fluid overload, unspecified: Secondary | ICD-10-CM | POA: Diagnosis not present

## 2015-07-06 DIAGNOSIS — N186 End stage renal disease: Secondary | ICD-10-CM | POA: Diagnosis not present

## 2015-07-08 DIAGNOSIS — E8770 Fluid overload, unspecified: Secondary | ICD-10-CM | POA: Diagnosis not present

## 2015-07-08 DIAGNOSIS — N186 End stage renal disease: Secondary | ICD-10-CM | POA: Diagnosis not present

## 2015-07-09 ENCOUNTER — Ambulatory Visit: Payer: Commercial Managed Care - HMO | Admitting: Family Medicine

## 2015-07-09 ENCOUNTER — Telehealth: Payer: Self-pay | Admitting: Family Medicine

## 2015-07-10 DIAGNOSIS — E8770 Fluid overload, unspecified: Secondary | ICD-10-CM | POA: Diagnosis not present

## 2015-07-10 DIAGNOSIS — N186 End stage renal disease: Secondary | ICD-10-CM | POA: Diagnosis not present

## 2015-07-13 DIAGNOSIS — E8770 Fluid overload, unspecified: Secondary | ICD-10-CM | POA: Diagnosis not present

## 2015-07-13 DIAGNOSIS — N186 End stage renal disease: Secondary | ICD-10-CM | POA: Diagnosis not present

## 2015-07-14 DIAGNOSIS — E8770 Fluid overload, unspecified: Secondary | ICD-10-CM | POA: Diagnosis not present

## 2015-07-14 DIAGNOSIS — N186 End stage renal disease: Secondary | ICD-10-CM | POA: Diagnosis not present

## 2015-07-15 DIAGNOSIS — N186 End stage renal disease: Secondary | ICD-10-CM | POA: Diagnosis not present

## 2015-07-15 DIAGNOSIS — E8770 Fluid overload, unspecified: Secondary | ICD-10-CM | POA: Diagnosis not present

## 2015-07-17 DIAGNOSIS — N186 End stage renal disease: Secondary | ICD-10-CM | POA: Diagnosis not present

## 2015-07-17 DIAGNOSIS — E8770 Fluid overload, unspecified: Secondary | ICD-10-CM | POA: Diagnosis not present

## 2015-07-20 DIAGNOSIS — E8770 Fluid overload, unspecified: Secondary | ICD-10-CM | POA: Diagnosis not present

## 2015-07-20 DIAGNOSIS — N186 End stage renal disease: Secondary | ICD-10-CM | POA: Diagnosis not present

## 2015-07-21 ENCOUNTER — Ambulatory Visit (INDEPENDENT_AMBULATORY_CARE_PROVIDER_SITE_OTHER): Payer: Commercial Managed Care - HMO | Admitting: Family Medicine

## 2015-07-21 ENCOUNTER — Encounter: Payer: Self-pay | Admitting: Family Medicine

## 2015-07-21 VITALS — BP 136/50 | HR 68 | Temp 99.7°F | Wt 137.0 lb

## 2015-07-21 DIAGNOSIS — E785 Hyperlipidemia, unspecified: Secondary | ICD-10-CM

## 2015-07-21 DIAGNOSIS — R5383 Other fatigue: Secondary | ICD-10-CM

## 2015-07-21 DIAGNOSIS — I10 Essential (primary) hypertension: Secondary | ICD-10-CM | POA: Diagnosis not present

## 2015-07-21 NOTE — Patient Instructions (Signed)

## 2015-07-21 NOTE — Progress Notes (Signed)
Pre visit review using our clinic review tool, if applicable. No additional management support is needed unless otherwise documented below in the visit note. 

## 2015-07-22 ENCOUNTER — Telehealth: Payer: Self-pay | Admitting: Family Medicine

## 2015-07-22 DIAGNOSIS — I10 Essential (primary) hypertension: Secondary | ICD-10-CM

## 2015-07-22 DIAGNOSIS — N184 Chronic kidney disease, stage 4 (severe): Secondary | ICD-10-CM

## 2015-07-22 DIAGNOSIS — E8770 Fluid overload, unspecified: Secondary | ICD-10-CM | POA: Diagnosis not present

## 2015-07-22 DIAGNOSIS — N186 End stage renal disease: Secondary | ICD-10-CM | POA: Diagnosis not present

## 2015-07-22 LAB — LIPID PANEL
CHOLESTEROL: 188 mg/dL (ref 0–200)
HDL: 48.5 mg/dL (ref >39.00)
LDL Cholesterol: 115 mg/dL — ABNORMAL HIGH (ref 0–99)
NonHDL: 139.77
Total CHOL/HDL Ratio: 4
Triglycerides: 123 mg/dL (ref 0.0–149.0)
VLDL: 24.6 mg/dL (ref 0.0–40.0)

## 2015-07-22 LAB — CBC WITH DIFFERENTIAL/PLATELET
BASOS PCT: 0.3 % (ref 0.0–3.0)
Basophils Absolute: 0 10*3/uL (ref 0.0–0.1)
EOS PCT: 3.1 % (ref 0.0–5.0)
Eosinophils Absolute: 0.1 10*3/uL (ref 0.0–0.7)
HCT: 26.5 % — ABNORMAL LOW (ref 36.0–46.0)
Hemoglobin: 8.7 g/dL — ABNORMAL LOW (ref 12.0–15.0)
LYMPHS ABS: 0.5 10*3/uL — AB (ref 0.7–4.0)
Lymphocytes Relative: 12.7 % (ref 12.0–46.0)
MCHC: 32.9 g/dL (ref 30.0–36.0)
MCV: 99 fl (ref 78.0–100.0)
MONO ABS: 0.4 10*3/uL (ref 0.1–1.0)
Monocytes Relative: 9.8 % (ref 3.0–12.0)
NEUTROS PCT: 74.1 % (ref 43.0–77.0)
Neutro Abs: 3.2 10*3/uL (ref 1.4–7.7)
Platelets: 126 10*3/uL — ABNORMAL LOW (ref 150.0–400.0)
RBC: 2.68 Mil/uL — ABNORMAL LOW (ref 3.87–5.11)
RDW: 17.5 % — AB (ref 11.5–15.5)
WBC: 4.3 10*3/uL (ref 4.0–10.5)

## 2015-07-22 LAB — HEPATIC FUNCTION PANEL
ALBUMIN: 3.5 g/dL (ref 3.5–5.2)
ALT: 22 U/L (ref 0–35)
AST: 36 U/L (ref 0–37)
Alkaline Phosphatase: 87 U/L (ref 39–117)
Bilirubin, Direct: 0.2 mg/dL (ref 0.0–0.3)
Total Bilirubin: 0.5 mg/dL (ref 0.2–1.2)
Total Protein: 7.2 g/dL (ref 6.0–8.3)

## 2015-07-22 LAB — VITAMIN B12: VITAMIN B 12: 577 pg/mL (ref 211–911)

## 2015-07-22 MED ORDER — CARVEDILOL 25 MG PO TABS: 25.0000 mg | ORAL_TABLET | Freq: Two times a day (BID) | ORAL | Status: DC

## 2015-07-22 MED ORDER — ISOSORBIDE MONONITRATE ER 30 MG PO TB24: 30.0000 mg | ORAL_TABLET | Freq: Two times a day (BID) | ORAL | Status: DC

## 2015-07-22 MED ORDER — LISINOPRIL 10 MG PO TABS: 10.0000 mg | ORAL_TABLET | Freq: Every evening | ORAL | Status: DC

## 2015-07-22 MED ORDER — CALCIUM ACETATE 667 MG PO CAPS: 667.0000 mg | ORAL_CAPSULE | Freq: Two times a day (BID) | ORAL | Status: DC

## 2015-07-22 NOTE — Telephone Encounter (Signed)
Relation to pt: self Call back number: [phone]  Reason for call:  Patient calling to inform PCP the medication that her dialysis doctor advised her to stop taking norvasc and amlodipine. Patient states PCP would send in an another medication. Patient states this was discussed at 07/21/2015 office visit.

## 2015-07-22 NOTE — Telephone Encounter (Signed)
The patient wanted to have her med's sent, all but the furosemide. Med's faxed.  Norvasc and Lipitor removed from her medication list.    KP

## 2015-07-24 DIAGNOSIS — E8770 Fluid overload, unspecified: Secondary | ICD-10-CM | POA: Diagnosis not present

## 2015-07-24 DIAGNOSIS — N186 End stage renal disease: Secondary | ICD-10-CM | POA: Diagnosis not present

## 2015-07-24 LAB — VITAMIN D 1,25 DIHYDROXY
VITAMIN D2 1, 25 (OH): 9 pg/mL
Vitamin D 1, 25 (OH)2 Total: 67 pg/mL (ref 18–72)
Vitamin D3 1, 25 (OH)2: 58 pg/mL

## 2015-07-27 DIAGNOSIS — N186 End stage renal disease: Secondary | ICD-10-CM | POA: Diagnosis not present

## 2015-07-27 DIAGNOSIS — E8770 Fluid overload, unspecified: Secondary | ICD-10-CM | POA: Diagnosis not present

## 2015-07-29 DIAGNOSIS — N186 End stage renal disease: Secondary | ICD-10-CM | POA: Diagnosis not present

## 2015-07-29 DIAGNOSIS — E8770 Fluid overload, unspecified: Secondary | ICD-10-CM | POA: Diagnosis not present

## 2015-07-29 NOTE — Assessment & Plan Note (Signed)
Check labs 

## 2015-07-29 NOTE — Telephone Encounter (Signed)
Pt was no show 07/09/15 10:00am, 6 month f/u appt, she came in 07/21/15, charge for no show?

## 2015-07-29 NOTE — Assessment & Plan Note (Signed)
con't lisinopril and coreg Lasix stable

## 2015-07-29 NOTE — Addendum Note (Signed)
Addended by: Rudene Anda on: 07/29/2015 09:23 AM   Modules accepted: Miquel Dunn

## 2015-07-29 NOTE — Progress Notes (Addendum)
Patient ID: [name] [name], female    DOB: 12/25/1940  Age: 74 y.o. MRN: [id number]    Subjective:  Subjective HPI [name] [name] presents for bp check .  No complaints.    Review of Systems  Constitutional: Negative for diaphoresis, appetite change, fatigue and unexpected weight change.  Eyes: Negative for pain, redness and visual disturbance.  Respiratory: Negative for cough, chest tightness, shortness of breath and wheezing.   Cardiovascular: Negative for chest pain, palpitations and leg swelling.  Endocrine: Negative for cold intolerance, heat intolerance, polydipsia, polyphagia and polyuria.  Genitourinary: Negative for dysuria, frequency and difficulty urinating.  Neurological: Negative for dizziness, light-headedness, numbness and headaches.    History Past Medical History  Diagnosis Date  . HYPERTENSION   . OSTEOPENIA   . Unspecified vitamin D deficiency   . Headache(784.0)   . POSTMENOPAUSAL STATUS   . Hepatitis B carrier     05/2009: neg Hep C; Hep B: core pos, Surf neg; fatty liver US 8/10 - 7/13  . HYPERLIPIDEMIA   . GLAUCOMA   . GERD   . DIABETES MELLITUS, TYPE I   . DEPRESSION   . Kidney failure     dialysis 3 times per week    She has past surgical history that includes Cholecystectomy (02/12/07); Tonsillectomy; Refractive surgery (07/29/09); Insertion of dialysis catheter (Right, 05/27/2014); and AV fistula placement (Left, 05/29/2014).   Her family history includes Arthritis in her other; Coronary artery disease in her other; Diabetes in her other; Hypertension in her other.She reports that she has never smoked. She does not have any smokeless tobacco history on file. She reports that she does not drink alcohol or use illicit drugs.  Current Outpatient Prescriptions on File Prior to Visit  Medication Sig Dispense Refill  . furosemide (LASIX) 40 MG tablet Take 1 tablet (40 mg total) by mouth daily. 30 tablet 5  . timolol (BETIMOL) 0.5 % ophthalmic solution  Place 1 drop into the right eye at bedtime.      No current facility-administered medications on file prior to visit.     Objective:  Objective Physical Exam  Constitutional: She is oriented to person, place, and time. She appears well-developed and well-nourished.  HENT:  Head: Normocephalic and atraumatic.  Eyes: Conjunctivae and EOM are normal.  Neck: Normal range of motion. Neck supple. No JVD present. Carotid bruit is not present. No thyromegaly present.  Cardiovascular: Normal rate, regular rhythm and normal heart sounds.   No murmur heard. Pulmonary/Chest: Effort normal and breath sounds normal. No respiratory distress. She has no wheezes. She has no rales. She exhibits no tenderness.  Musculoskeletal: She exhibits no edema.  Neurological: She is alert and oriented to person, place, and time.  Psychiatric: She has a normal mood and affect. Her behavior is normal.   BP 136/50 mmHg  Pulse 68  Temp(Src) 99.7 F (37.6 C) (Oral)  Wt 137 lb (62.143 kg)  SpO2 98% Wt Readings from Last 3 Encounters:  07/21/15 137 lb (62.143 kg)  12/22/14 142 lb (64.411 kg)  06/25/14 128 lb 14.4 oz (58.469 kg)     Lab Results  Component Value Date   WBC 4.3 07/21/2015   HGB 8.7 Repeated and verified X2.* 07/21/2015   HCT 26.5 Repeated and verified X2.* 07/21/2015   PLT 126.0* 07/21/2015   GLUCOSE 88 12/22/2014   CHOL 188 07/21/2015   TRIG 123.0 07/21/2015   HDL 48.50 07/21/2015   LDLDIRECT 196.2 04/26/2013   LDLCALC 115* 07/21/2015  ALT 22 07/21/2015   AST 36 07/21/2015   NA 138 12/22/2014   K 4.3 12/22/2014   CL 95* 12/22/2014   CREATININE 6.24* 12/22/2014   BUN 46* 12/22/2014   CO2 32 12/22/2014   TSH 3.140 05/17/2014   INR 1.09 05/20/2014   HGBA1C 5.2 12/22/2014   MICROALBUR 174.7* 01/03/2008    No results found.   Assessment & Plan:  Plan I have discontinued Ms. Reffitt's acetaminophen, Vitamin D3, atorvastatin, and amLODipine. I am also having her maintain her  timolol and furosemide.  No orders of the defined types were placed in this encounter.    Problem List Items Addressed This Visit    Hyperlipidemia LDL goal <70    Check labs      Essential hypertension    con't lisinopril and coreg Lasix stable      Relevant Orders   Lipid panel (Completed)   CBC with Differential/Platelet (Completed)   Vitamin B12 (Completed)   Vitamin D 1,25 dihydroxy (Completed)   Hepatic function panel (Completed)    Other Visit Diagnoses    Hyperlipidemia    -  Primary    Relevant Orders    Lipid panel (Completed)    CBC with Differential/Platelet (Completed)    Vitamin B12 (Completed)    Vitamin D 1,25 dihydroxy (Completed)    Hepatic function panel (Completed)    Other fatigue        Relevant Orders    Lipid panel (Completed)    CBC with Differential/Platelet (Completed)    Vitamin B12 (Completed)    Vitamin D 1,25 dihydroxy (Completed)    Hepatic function panel (Completed)       Follow-up: Return in about 6 months (around 01/18/2016), or if symptoms worsen or fail to improve.  [name] Koyanagi, DO

## 2015-07-29 NOTE — Telephone Encounter (Signed)
no

## 2015-07-31 DIAGNOSIS — N186 End stage renal disease: Secondary | ICD-10-CM | POA: Diagnosis not present

## 2015-07-31 DIAGNOSIS — E8770 Fluid overload, unspecified: Secondary | ICD-10-CM | POA: Diagnosis not present

## 2015-07-31 DIAGNOSIS — Z992 Dependence on renal dialysis: Secondary | ICD-10-CM | POA: Diagnosis not present

## 2015-07-31 DIAGNOSIS — E1122 Type 2 diabetes mellitus with diabetic chronic kidney disease: Secondary | ICD-10-CM | POA: Diagnosis not present

## 2015-08-03 DIAGNOSIS — N186 End stage renal disease: Secondary | ICD-10-CM | POA: Diagnosis not present

## 2015-08-05 DIAGNOSIS — N186 End stage renal disease: Secondary | ICD-10-CM | POA: Diagnosis not present

## 2015-08-06 DIAGNOSIS — N186 End stage renal disease: Secondary | ICD-10-CM | POA: Diagnosis not present

## 2015-08-10 DIAGNOSIS — N186 End stage renal disease: Secondary | ICD-10-CM | POA: Diagnosis not present

## 2015-08-12 DIAGNOSIS — N186 End stage renal disease: Secondary | ICD-10-CM | POA: Diagnosis not present

## 2015-08-14 DIAGNOSIS — N186 End stage renal disease: Secondary | ICD-10-CM | POA: Diagnosis not present

## 2015-08-17 DIAGNOSIS — N186 End stage renal disease: Secondary | ICD-10-CM | POA: Diagnosis not present

## 2015-08-19 DIAGNOSIS — N186 End stage renal disease: Secondary | ICD-10-CM | POA: Diagnosis not present

## 2015-08-21 DIAGNOSIS — N186 End stage renal disease: Secondary | ICD-10-CM | POA: Diagnosis not present

## 2015-08-24 DIAGNOSIS — N186 End stage renal disease: Secondary | ICD-10-CM | POA: Diagnosis not present

## 2015-08-26 DIAGNOSIS — N186 End stage renal disease: Secondary | ICD-10-CM | POA: Diagnosis not present

## 2015-08-28 DIAGNOSIS — N186 End stage renal disease: Secondary | ICD-10-CM | POA: Diagnosis not present

## 2015-08-31 DIAGNOSIS — N186 End stage renal disease: Secondary | ICD-10-CM | POA: Diagnosis not present

## 2015-08-31 DIAGNOSIS — E1122 Type 2 diabetes mellitus with diabetic chronic kidney disease: Secondary | ICD-10-CM | POA: Diagnosis not present

## 2015-08-31 DIAGNOSIS — Z992 Dependence on renal dialysis: Secondary | ICD-10-CM | POA: Diagnosis not present

## 2015-09-02 DIAGNOSIS — N186 End stage renal disease: Secondary | ICD-10-CM | POA: Diagnosis not present

## 2015-09-04 DIAGNOSIS — N186 End stage renal disease: Secondary | ICD-10-CM | POA: Diagnosis not present

## 2015-09-07 DIAGNOSIS — N186 End stage renal disease: Secondary | ICD-10-CM | POA: Diagnosis not present

## 2015-09-09 DIAGNOSIS — N186 End stage renal disease: Secondary | ICD-10-CM | POA: Diagnosis not present

## 2015-09-11 DIAGNOSIS — N186 End stage renal disease: Secondary | ICD-10-CM | POA: Diagnosis not present

## 2015-09-14 DIAGNOSIS — N186 End stage renal disease: Secondary | ICD-10-CM | POA: Diagnosis not present

## 2015-09-16 DIAGNOSIS — N186 End stage renal disease: Secondary | ICD-10-CM | POA: Diagnosis not present

## 2015-09-18 DIAGNOSIS — N186 End stage renal disease: Secondary | ICD-10-CM | POA: Diagnosis not present

## 2015-09-21 DIAGNOSIS — N186 End stage renal disease: Secondary | ICD-10-CM | POA: Diagnosis not present

## 2015-09-23 DIAGNOSIS — N186 End stage renal disease: Secondary | ICD-10-CM | POA: Diagnosis not present

## 2015-09-25 DIAGNOSIS — N186 End stage renal disease: Secondary | ICD-10-CM | POA: Diagnosis not present

## 2015-09-30 DIAGNOSIS — N186 End stage renal disease: Secondary | ICD-10-CM | POA: Diagnosis not present

## 2015-09-30 DIAGNOSIS — Z992 Dependence on renal dialysis: Secondary | ICD-10-CM | POA: Diagnosis not present

## 2015-09-30 DIAGNOSIS — E1122 Type 2 diabetes mellitus with diabetic chronic kidney disease: Secondary | ICD-10-CM | POA: Diagnosis not present

## 2015-10-02 DIAGNOSIS — N186 End stage renal disease: Secondary | ICD-10-CM | POA: Diagnosis not present

## 2015-10-05 DIAGNOSIS — N186 End stage renal disease: Secondary | ICD-10-CM | POA: Diagnosis not present

## 2015-10-06 DIAGNOSIS — I871 Compression of vein: Secondary | ICD-10-CM | POA: Diagnosis not present

## 2015-10-06 DIAGNOSIS — N186 End stage renal disease: Secondary | ICD-10-CM | POA: Diagnosis not present

## 2015-10-06 DIAGNOSIS — Z992 Dependence on renal dialysis: Secondary | ICD-10-CM | POA: Diagnosis not present

## 2015-10-06 DIAGNOSIS — T82858D Stenosis of vascular prosthetic devices, implants and grafts, subsequent encounter: Secondary | ICD-10-CM | POA: Diagnosis not present

## 2015-10-07 DIAGNOSIS — N186 End stage renal disease: Secondary | ICD-10-CM | POA: Diagnosis not present

## 2015-10-09 DIAGNOSIS — N186 End stage renal disease: Secondary | ICD-10-CM | POA: Diagnosis not present

## 2015-10-12 DIAGNOSIS — N186 End stage renal disease: Secondary | ICD-10-CM | POA: Diagnosis not present

## 2015-10-14 DIAGNOSIS — N186 End stage renal disease: Secondary | ICD-10-CM | POA: Diagnosis not present

## 2015-10-16 DIAGNOSIS — N186 End stage renal disease: Secondary | ICD-10-CM | POA: Diagnosis not present

## 2015-10-19 DIAGNOSIS — N186 End stage renal disease: Secondary | ICD-10-CM | POA: Diagnosis not present

## 2015-10-21 DIAGNOSIS — N186 End stage renal disease: Secondary | ICD-10-CM | POA: Diagnosis not present

## 2015-10-23 DIAGNOSIS — N186 End stage renal disease: Secondary | ICD-10-CM | POA: Diagnosis not present

## 2015-10-26 DIAGNOSIS — N186 End stage renal disease: Secondary | ICD-10-CM | POA: Diagnosis not present

## 2015-10-28 DIAGNOSIS — N186 End stage renal disease: Secondary | ICD-10-CM | POA: Diagnosis not present

## 2015-10-30 DIAGNOSIS — N186 End stage renal disease: Secondary | ICD-10-CM | POA: Diagnosis not present

## 2015-10-31 DIAGNOSIS — Z992 Dependence on renal dialysis: Secondary | ICD-10-CM | POA: Diagnosis not present

## 2015-10-31 DIAGNOSIS — E1122 Type 2 diabetes mellitus with diabetic chronic kidney disease: Secondary | ICD-10-CM | POA: Diagnosis not present

## 2015-10-31 DIAGNOSIS — N186 End stage renal disease: Secondary | ICD-10-CM | POA: Diagnosis not present

## 2015-11-02 DIAGNOSIS — N186 End stage renal disease: Secondary | ICD-10-CM | POA: Diagnosis not present

## 2015-11-04 DIAGNOSIS — N186 End stage renal disease: Secondary | ICD-10-CM | POA: Diagnosis not present

## 2015-11-06 DIAGNOSIS — N186 End stage renal disease: Secondary | ICD-10-CM | POA: Diagnosis not present

## 2015-11-11 DIAGNOSIS — N186 End stage renal disease: Secondary | ICD-10-CM | POA: Diagnosis not present

## 2015-11-13 DIAGNOSIS — N186 End stage renal disease: Secondary | ICD-10-CM | POA: Diagnosis not present

## 2015-11-16 DIAGNOSIS — N186 End stage renal disease: Secondary | ICD-10-CM | POA: Diagnosis not present

## 2015-11-18 DIAGNOSIS — N186 End stage renal disease: Secondary | ICD-10-CM | POA: Diagnosis not present

## 2015-11-20 DIAGNOSIS — N186 End stage renal disease: Secondary | ICD-10-CM | POA: Diagnosis not present

## 2015-11-23 DIAGNOSIS — N186 End stage renal disease: Secondary | ICD-10-CM | POA: Diagnosis not present

## 2015-11-25 DIAGNOSIS — N186 End stage renal disease: Secondary | ICD-10-CM | POA: Diagnosis not present

## 2015-11-27 DIAGNOSIS — N186 End stage renal disease: Secondary | ICD-10-CM | POA: Diagnosis not present

## 2015-11-30 DIAGNOSIS — N186 End stage renal disease: Secondary | ICD-10-CM | POA: Diagnosis not present

## 2015-12-01 DIAGNOSIS — E1122 Type 2 diabetes mellitus with diabetic chronic kidney disease: Secondary | ICD-10-CM | POA: Diagnosis not present

## 2015-12-01 DIAGNOSIS — N186 End stage renal disease: Secondary | ICD-10-CM | POA: Diagnosis not present

## 2015-12-01 DIAGNOSIS — Z992 Dependence on renal dialysis: Secondary | ICD-10-CM | POA: Diagnosis not present

## 2015-12-02 DIAGNOSIS — N186 End stage renal disease: Secondary | ICD-10-CM | POA: Diagnosis not present

## 2015-12-04 DIAGNOSIS — N186 End stage renal disease: Secondary | ICD-10-CM | POA: Diagnosis not present

## 2015-12-07 DIAGNOSIS — N186 End stage renal disease: Secondary | ICD-10-CM | POA: Diagnosis not present

## 2015-12-09 DIAGNOSIS — N186 End stage renal disease: Secondary | ICD-10-CM | POA: Diagnosis not present

## 2015-12-11 DIAGNOSIS — N186 End stage renal disease: Secondary | ICD-10-CM | POA: Diagnosis not present

## 2015-12-14 DIAGNOSIS — N186 End stage renal disease: Secondary | ICD-10-CM | POA: Diagnosis not present

## 2015-12-16 DIAGNOSIS — N186 End stage renal disease: Secondary | ICD-10-CM | POA: Diagnosis not present

## 2015-12-18 DIAGNOSIS — N186 End stage renal disease: Secondary | ICD-10-CM | POA: Diagnosis not present

## 2015-12-21 DIAGNOSIS — N186 End stage renal disease: Secondary | ICD-10-CM | POA: Diagnosis not present

## 2015-12-23 DIAGNOSIS — N186 End stage renal disease: Secondary | ICD-10-CM | POA: Diagnosis not present

## 2015-12-25 DIAGNOSIS — N186 End stage renal disease: Secondary | ICD-10-CM | POA: Diagnosis not present

## 2015-12-28 DIAGNOSIS — N186 End stage renal disease: Secondary | ICD-10-CM | POA: Diagnosis not present

## 2015-12-29 DIAGNOSIS — Z992 Dependence on renal dialysis: Secondary | ICD-10-CM | POA: Diagnosis not present

## 2015-12-29 DIAGNOSIS — E1122 Type 2 diabetes mellitus with diabetic chronic kidney disease: Secondary | ICD-10-CM | POA: Diagnosis not present

## 2015-12-29 DIAGNOSIS — N186 End stage renal disease: Secondary | ICD-10-CM | POA: Diagnosis not present

## 2015-12-30 ENCOUNTER — Other Ambulatory Visit: Payer: Self-pay | Admitting: Family Medicine

## 2015-12-30 DIAGNOSIS — N186 End stage renal disease: Secondary | ICD-10-CM | POA: Diagnosis not present

## 2016-01-01 DIAGNOSIS — N186 End stage renal disease: Secondary | ICD-10-CM | POA: Diagnosis not present

## 2016-01-04 DIAGNOSIS — N186 End stage renal disease: Secondary | ICD-10-CM | POA: Diagnosis not present

## 2016-01-06 DIAGNOSIS — N186 End stage renal disease: Secondary | ICD-10-CM | POA: Diagnosis not present

## 2016-01-07 DIAGNOSIS — N186 End stage renal disease: Secondary | ICD-10-CM | POA: Diagnosis not present

## 2016-01-11 DIAGNOSIS — N186 End stage renal disease: Secondary | ICD-10-CM | POA: Diagnosis not present

## 2016-01-13 DIAGNOSIS — N186 End stage renal disease: Secondary | ICD-10-CM | POA: Diagnosis not present

## 2016-01-15 DIAGNOSIS — N186 End stage renal disease: Secondary | ICD-10-CM | POA: Diagnosis not present

## 2016-01-18 DIAGNOSIS — N186 End stage renal disease: Secondary | ICD-10-CM | POA: Diagnosis not present

## 2016-01-20 DIAGNOSIS — N186 End stage renal disease: Secondary | ICD-10-CM | POA: Diagnosis not present

## 2016-01-21 ENCOUNTER — Ambulatory Visit: Payer: Commercial Managed Care - HMO | Admitting: Family Medicine

## 2016-01-22 ENCOUNTER — Telehealth: Payer: Self-pay | Admitting: Family Medicine

## 2016-01-22 ENCOUNTER — Encounter: Payer: Self-pay | Admitting: Family Medicine

## 2016-01-22 DIAGNOSIS — N186 End stage renal disease: Secondary | ICD-10-CM | POA: Diagnosis not present

## 2016-01-22 NOTE — Telephone Encounter (Signed)
charge 

## 2016-01-22 NOTE — Telephone Encounter (Signed)
Pt was no show 01/21/16 1:15pm for follow up appt, 2nd no show w/in 12 months, charge or no charge?

## 2016-01-22 NOTE — Telephone Encounter (Signed)
Marked to charge and mailing no show letter °

## 2016-01-25 DIAGNOSIS — N186 End stage renal disease: Secondary | ICD-10-CM | POA: Diagnosis not present

## 2016-01-27 DIAGNOSIS — N186 End stage renal disease: Secondary | ICD-10-CM | POA: Diagnosis not present

## 2016-01-29 DIAGNOSIS — E1122 Type 2 diabetes mellitus with diabetic chronic kidney disease: Secondary | ICD-10-CM | POA: Diagnosis not present

## 2016-01-29 DIAGNOSIS — N186 End stage renal disease: Secondary | ICD-10-CM | POA: Diagnosis not present

## 2016-01-29 DIAGNOSIS — Z992 Dependence on renal dialysis: Secondary | ICD-10-CM | POA: Diagnosis not present

## 2016-02-01 DIAGNOSIS — N186 End stage renal disease: Secondary | ICD-10-CM | POA: Diagnosis not present

## 2016-02-03 DIAGNOSIS — N186 End stage renal disease: Secondary | ICD-10-CM | POA: Diagnosis not present

## 2016-02-05 DIAGNOSIS — N186 End stage renal disease: Secondary | ICD-10-CM | POA: Diagnosis not present

## 2016-02-08 DIAGNOSIS — N186 End stage renal disease: Secondary | ICD-10-CM | POA: Diagnosis not present

## 2016-02-10 DIAGNOSIS — N186 End stage renal disease: Secondary | ICD-10-CM | POA: Diagnosis not present

## 2016-02-12 DIAGNOSIS — N186 End stage renal disease: Secondary | ICD-10-CM | POA: Diagnosis not present

## 2016-02-15 DIAGNOSIS — N186 End stage renal disease: Secondary | ICD-10-CM | POA: Diagnosis not present

## 2016-02-17 DIAGNOSIS — N186 End stage renal disease: Secondary | ICD-10-CM | POA: Diagnosis not present

## 2016-02-19 DIAGNOSIS — N186 End stage renal disease: Secondary | ICD-10-CM | POA: Diagnosis not present

## 2016-02-22 DIAGNOSIS — N186 End stage renal disease: Secondary | ICD-10-CM | POA: Diagnosis not present

## 2016-02-24 DIAGNOSIS — N186 End stage renal disease: Secondary | ICD-10-CM | POA: Diagnosis not present

## 2016-02-26 DIAGNOSIS — N186 End stage renal disease: Secondary | ICD-10-CM | POA: Diagnosis not present

## 2016-02-28 DIAGNOSIS — Z992 Dependence on renal dialysis: Secondary | ICD-10-CM | POA: Diagnosis not present

## 2016-02-28 DIAGNOSIS — N186 End stage renal disease: Secondary | ICD-10-CM | POA: Diagnosis not present

## 2016-02-28 DIAGNOSIS — E1122 Type 2 diabetes mellitus with diabetic chronic kidney disease: Secondary | ICD-10-CM | POA: Diagnosis not present

## 2016-02-29 DIAGNOSIS — N186 End stage renal disease: Secondary | ICD-10-CM | POA: Diagnosis not present

## 2016-03-01 DIAGNOSIS — M810 Age-related osteoporosis without current pathological fracture: Secondary | ICD-10-CM | POA: Diagnosis not present

## 2016-03-01 DIAGNOSIS — Z1231 Encounter for screening mammogram for malignant neoplasm of breast: Secondary | ICD-10-CM | POA: Diagnosis not present

## 2016-03-01 LAB — HM MAMMOGRAPHY

## 2016-03-02 DIAGNOSIS — N186 End stage renal disease: Secondary | ICD-10-CM | POA: Diagnosis not present

## 2016-03-03 ENCOUNTER — Encounter: Payer: Self-pay | Admitting: Family Medicine

## 2016-03-04 DIAGNOSIS — N186 End stage renal disease: Secondary | ICD-10-CM | POA: Diagnosis not present

## 2016-03-07 DIAGNOSIS — N186 End stage renal disease: Secondary | ICD-10-CM | POA: Diagnosis not present

## 2016-03-09 DIAGNOSIS — N186 End stage renal disease: Secondary | ICD-10-CM | POA: Diagnosis not present

## 2016-03-11 DIAGNOSIS — N186 End stage renal disease: Secondary | ICD-10-CM | POA: Diagnosis not present

## 2016-03-14 DIAGNOSIS — N186 End stage renal disease: Secondary | ICD-10-CM | POA: Diagnosis not present

## 2016-03-16 DIAGNOSIS — N186 End stage renal disease: Secondary | ICD-10-CM | POA: Diagnosis not present

## 2016-03-18 DIAGNOSIS — N186 End stage renal disease: Secondary | ICD-10-CM | POA: Diagnosis not present

## 2016-03-21 DIAGNOSIS — N186 End stage renal disease: Secondary | ICD-10-CM | POA: Diagnosis not present

## 2016-03-23 DIAGNOSIS — N186 End stage renal disease: Secondary | ICD-10-CM | POA: Diagnosis not present

## 2016-03-25 DIAGNOSIS — N186 End stage renal disease: Secondary | ICD-10-CM | POA: Diagnosis not present

## 2016-03-28 DIAGNOSIS — N186 End stage renal disease: Secondary | ICD-10-CM | POA: Diagnosis not present

## 2016-03-30 DIAGNOSIS — N186 End stage renal disease: Secondary | ICD-10-CM | POA: Diagnosis not present

## 2016-03-30 DIAGNOSIS — E1122 Type 2 diabetes mellitus with diabetic chronic kidney disease: Secondary | ICD-10-CM | POA: Diagnosis not present

## 2016-03-30 DIAGNOSIS — Z992 Dependence on renal dialysis: Secondary | ICD-10-CM | POA: Diagnosis not present

## 2016-04-01 DIAGNOSIS — N186 End stage renal disease: Secondary | ICD-10-CM | POA: Diagnosis not present

## 2016-04-04 DIAGNOSIS — N186 End stage renal disease: Secondary | ICD-10-CM | POA: Diagnosis not present

## 2016-04-06 DIAGNOSIS — N186 End stage renal disease: Secondary | ICD-10-CM | POA: Diagnosis not present

## 2016-04-08 DIAGNOSIS — N186 End stage renal disease: Secondary | ICD-10-CM | POA: Diagnosis not present

## 2016-04-11 DIAGNOSIS — N186 End stage renal disease: Secondary | ICD-10-CM | POA: Diagnosis not present

## 2016-04-13 ENCOUNTER — Encounter: Payer: Self-pay | Admitting: Family Medicine

## 2016-04-13 DIAGNOSIS — N186 End stage renal disease: Secondary | ICD-10-CM | POA: Diagnosis not present

## 2016-04-15 DIAGNOSIS — N186 End stage renal disease: Secondary | ICD-10-CM | POA: Diagnosis not present

## 2016-04-18 DIAGNOSIS — N186 End stage renal disease: Secondary | ICD-10-CM | POA: Diagnosis not present

## 2016-04-20 DIAGNOSIS — N186 End stage renal disease: Secondary | ICD-10-CM | POA: Diagnosis not present

## 2016-04-22 DIAGNOSIS — N186 End stage renal disease: Secondary | ICD-10-CM | POA: Diagnosis not present

## 2016-04-25 DIAGNOSIS — N186 End stage renal disease: Secondary | ICD-10-CM | POA: Diagnosis not present

## 2016-04-27 DIAGNOSIS — N186 End stage renal disease: Secondary | ICD-10-CM | POA: Diagnosis not present

## 2016-04-29 DIAGNOSIS — E1122 Type 2 diabetes mellitus with diabetic chronic kidney disease: Secondary | ICD-10-CM | POA: Diagnosis not present

## 2016-04-29 DIAGNOSIS — Z992 Dependence on renal dialysis: Secondary | ICD-10-CM | POA: Diagnosis not present

## 2016-04-29 DIAGNOSIS — N186 End stage renal disease: Secondary | ICD-10-CM | POA: Diagnosis not present

## 2016-05-02 DIAGNOSIS — N186 End stage renal disease: Secondary | ICD-10-CM | POA: Diagnosis not present

## 2016-05-04 DIAGNOSIS — N186 End stage renal disease: Secondary | ICD-10-CM | POA: Diagnosis not present

## 2016-05-05 DIAGNOSIS — N186 End stage renal disease: Secondary | ICD-10-CM | POA: Diagnosis not present

## 2016-05-09 DIAGNOSIS — N186 End stage renal disease: Secondary | ICD-10-CM | POA: Diagnosis not present

## 2016-05-11 DIAGNOSIS — N186 End stage renal disease: Secondary | ICD-10-CM | POA: Diagnosis not present

## 2016-05-13 DIAGNOSIS — N186 End stage renal disease: Secondary | ICD-10-CM | POA: Diagnosis not present

## 2016-05-16 DIAGNOSIS — N186 End stage renal disease: Secondary | ICD-10-CM | POA: Diagnosis not present

## 2016-05-18 DIAGNOSIS — N186 End stage renal disease: Secondary | ICD-10-CM | POA: Diagnosis not present

## 2016-05-20 DIAGNOSIS — N186 End stage renal disease: Secondary | ICD-10-CM | POA: Diagnosis not present

## 2016-05-23 DIAGNOSIS — N186 End stage renal disease: Secondary | ICD-10-CM | POA: Diagnosis not present

## 2016-05-25 DIAGNOSIS — N186 End stage renal disease: Secondary | ICD-10-CM | POA: Diagnosis not present

## 2016-05-27 DIAGNOSIS — N186 End stage renal disease: Secondary | ICD-10-CM | POA: Diagnosis not present

## 2016-05-30 DIAGNOSIS — N186 End stage renal disease: Secondary | ICD-10-CM | POA: Diagnosis not present

## 2016-05-30 DIAGNOSIS — E1122 Type 2 diabetes mellitus with diabetic chronic kidney disease: Secondary | ICD-10-CM | POA: Diagnosis not present

## 2016-05-30 DIAGNOSIS — Z992 Dependence on renal dialysis: Secondary | ICD-10-CM | POA: Diagnosis not present

## 2016-06-01 DIAGNOSIS — N186 End stage renal disease: Secondary | ICD-10-CM | POA: Diagnosis not present

## 2016-06-03 DIAGNOSIS — N186 End stage renal disease: Secondary | ICD-10-CM | POA: Diagnosis not present

## 2016-06-06 DIAGNOSIS — N186 End stage renal disease: Secondary | ICD-10-CM | POA: Diagnosis not present

## 2016-06-08 DIAGNOSIS — N186 End stage renal disease: Secondary | ICD-10-CM | POA: Diagnosis not present

## 2016-06-10 DIAGNOSIS — N186 End stage renal disease: Secondary | ICD-10-CM | POA: Diagnosis not present

## 2016-06-13 DIAGNOSIS — N186 End stage renal disease: Secondary | ICD-10-CM | POA: Diagnosis not present

## 2016-06-15 DIAGNOSIS — N186 End stage renal disease: Secondary | ICD-10-CM | POA: Diagnosis not present

## 2016-06-17 DIAGNOSIS — N186 End stage renal disease: Secondary | ICD-10-CM | POA: Diagnosis not present

## 2016-06-20 DIAGNOSIS — N186 End stage renal disease: Secondary | ICD-10-CM | POA: Diagnosis not present

## 2016-06-21 ENCOUNTER — Other Ambulatory Visit: Payer: Self-pay

## 2016-06-21 DIAGNOSIS — I10 Essential (primary) hypertension: Secondary | ICD-10-CM

## 2016-06-21 MED ORDER — LISINOPRIL 10 MG PO TABS: 10.0000 mg | ORAL_TABLET | Freq: Every evening | ORAL | 0 refills | Status: DC

## 2016-06-22 DIAGNOSIS — N186 End stage renal disease: Secondary | ICD-10-CM | POA: Diagnosis not present

## 2016-06-24 DIAGNOSIS — N186 End stage renal disease: Secondary | ICD-10-CM | POA: Diagnosis not present

## 2016-06-27 DIAGNOSIS — N186 End stage renal disease: Secondary | ICD-10-CM | POA: Diagnosis not present

## 2016-06-29 DIAGNOSIS — N186 End stage renal disease: Secondary | ICD-10-CM | POA: Diagnosis not present

## 2016-06-30 DIAGNOSIS — E1122 Type 2 diabetes mellitus with diabetic chronic kidney disease: Secondary | ICD-10-CM | POA: Diagnosis not present

## 2016-06-30 DIAGNOSIS — Z992 Dependence on renal dialysis: Secondary | ICD-10-CM | POA: Diagnosis not present

## 2016-06-30 DIAGNOSIS — N186 End stage renal disease: Secondary | ICD-10-CM | POA: Diagnosis not present

## 2016-07-01 DIAGNOSIS — N186 End stage renal disease: Secondary | ICD-10-CM | POA: Diagnosis not present

## 2016-07-04 DIAGNOSIS — N186 End stage renal disease: Secondary | ICD-10-CM | POA: Diagnosis not present

## 2016-07-06 DIAGNOSIS — N186 End stage renal disease: Secondary | ICD-10-CM | POA: Diagnosis not present

## 2016-07-08 DIAGNOSIS — N186 End stage renal disease: Secondary | ICD-10-CM | POA: Diagnosis not present

## 2016-07-11 DIAGNOSIS — N186 End stage renal disease: Secondary | ICD-10-CM | POA: Diagnosis not present

## 2016-07-13 DIAGNOSIS — N186 End stage renal disease: Secondary | ICD-10-CM | POA: Diagnosis not present

## 2016-07-14 DIAGNOSIS — N186 End stage renal disease: Secondary | ICD-10-CM | POA: Diagnosis not present

## 2016-07-18 DIAGNOSIS — N186 End stage renal disease: Secondary | ICD-10-CM | POA: Diagnosis not present

## 2016-07-20 ENCOUNTER — Emergency Department (HOSPITAL_COMMUNITY): Payer: Commercial Managed Care - HMO

## 2016-07-20 ENCOUNTER — Encounter (HOSPITAL_COMMUNITY): Payer: Self-pay | Admitting: *Deleted

## 2016-07-20 ENCOUNTER — Emergency Department (HOSPITAL_COMMUNITY)
Admission: EM | Admit: 2016-07-20 | Discharge: 2016-07-20 | Disposition: A | Discharge status: Home / Self Care | Payer: Commercial Managed Care - HMO | Attending: Emergency Medicine | Admitting: Emergency Medicine

## 2016-07-20 DIAGNOSIS — Z992 Dependence on renal dialysis: Secondary | ICD-10-CM | POA: Diagnosis not present

## 2016-07-20 DIAGNOSIS — I120 Hypertensive chronic kidney disease with stage 5 chronic kidney disease or end stage renal disease: Secondary | ICD-10-CM | POA: Diagnosis not present

## 2016-07-20 DIAGNOSIS — W06XXXA Fall from bed, initial encounter: Secondary | ICD-10-CM | POA: Diagnosis not present

## 2016-07-20 DIAGNOSIS — W19XXXA Unspecified fall, initial encounter: Secondary | ICD-10-CM

## 2016-07-20 DIAGNOSIS — M25561 Pain in right knee: Secondary | ICD-10-CM | POA: Insufficient documentation

## 2016-07-20 DIAGNOSIS — M25461 Effusion, right knee: Secondary | ICD-10-CM | POA: Diagnosis not present

## 2016-07-20 DIAGNOSIS — N186 End stage renal disease: Secondary | ICD-10-CM | POA: Diagnosis not present

## 2016-07-20 DIAGNOSIS — S0990XA Unspecified injury of head, initial encounter: Secondary | ICD-10-CM | POA: Diagnosis not present

## 2016-07-20 DIAGNOSIS — Z79899 Other long term (current) drug therapy: Secondary | ICD-10-CM | POA: Insufficient documentation

## 2016-07-20 DIAGNOSIS — R079 Chest pain, unspecified: Secondary | ICD-10-CM | POA: Diagnosis not present

## 2016-07-20 DIAGNOSIS — E1122 Type 2 diabetes mellitus with diabetic chronic kidney disease: Secondary | ICD-10-CM | POA: Diagnosis not present

## 2016-07-20 DIAGNOSIS — S0083XA Contusion of other part of head, initial encounter: Secondary | ICD-10-CM | POA: Insufficient documentation

## 2016-07-20 DIAGNOSIS — Y999 Unspecified external cause status: Secondary | ICD-10-CM | POA: Diagnosis not present

## 2016-07-20 DIAGNOSIS — Y9389 Activity, other specified: Secondary | ICD-10-CM | POA: Diagnosis not present

## 2016-07-20 DIAGNOSIS — S99922A Unspecified injury of left foot, initial encounter: Secondary | ICD-10-CM | POA: Diagnosis not present

## 2016-07-20 DIAGNOSIS — M79672 Pain in left foot: Secondary | ICD-10-CM | POA: Diagnosis not present

## 2016-07-20 DIAGNOSIS — Y9289 Other specified places as the place of occurrence of the external cause: Secondary | ICD-10-CM | POA: Insufficient documentation

## 2016-07-20 DIAGNOSIS — S199XXA Unspecified injury of neck, initial encounter: Secondary | ICD-10-CM | POA: Diagnosis not present

## 2016-07-20 LAB — CBC
HCT: 30.2 % — ABNORMAL LOW (ref 36.0–46.0)
Hemoglobin: 9.9 g/dL — ABNORMAL LOW (ref 12.0–15.0)
MCH: 31.6 pg (ref 26.0–34.0)
MCHC: 32.8 g/dL (ref 30.0–36.0)
MCV: 96.5 fL (ref 78.0–100.0)
PLATELETS: 107 10*3/uL — AB (ref 150–400)
RBC: 3.13 MIL/uL — AB (ref 3.87–5.11)
RDW: 17.4 % — ABNORMAL HIGH (ref 11.5–15.5)
WBC: 3.9 10*3/uL — ABNORMAL LOW (ref 4.0–10.5)

## 2016-07-20 LAB — BASIC METABOLIC PANEL
Anion gap: 17 — ABNORMAL HIGH (ref 5–15)
BUN: 71 mg/dL — AB (ref 6–20)
CO2: 25 mmol/L (ref 22–32)
CREATININE: 7.45 mg/dL — AB (ref 0.44–1.00)
Calcium: 8.9 mg/dL (ref 8.9–10.3)
Chloride: 90 mmol/L — ABNORMAL LOW (ref 101–111)
GFR calc Af Amer: 6 mL/min — ABNORMAL LOW (ref >60)
GFR, EST NON AFRICAN AMERICAN: 5 mL/min — AB (ref >60)
GLUCOSE: 90 mg/dL (ref 65–99)
POTASSIUM: 4.3 mmol/L (ref 3.5–5.1)
Sodium: 132 mmol/L — ABNORMAL LOW (ref 135–145)

## 2016-07-20 LAB — PROTIME-INR
INR: 1.05
PROTHROMBIN TIME: 13.7 s (ref 11.4–15.2)

## 2016-07-20 NOTE — ED Provider Notes (Signed)
[facility] DEPT Provider Note   CSN: [id number] Arrival date & time: 07/20/16  0093     History   Chief Complaint Chief Complaint  Patient presents with  . Fall    HPI [name] [name] is a 75 y.o. female with a past medical history significant for hypertension, hyperlipidemia, diabetes, ESRD on dialysis Monday/Wednesday/Friday who presents after a fall Sunday evening causing injury to the right face, neck pain, right knee pain, and left foot pain. Patient reports that she rolled out of bed Sunday evening hitting her right face on a bedside table prior to falling on the ground. Patient denies loss of consciousness but does report injuries on her right knee, left foot, and right face. She says that she does take blood thinners however, these were not seen in her chart. She is unable to specify what the medication is.  Patient says her head and face hurt initially however, that has improved. Patient still has ecchymoses on her right face but no vision changes, current headache, nausea, vomiting. Patient denies any difficulty with numbness, tingling, or weakness in any extremities.  Patient says that she still able to ambulate without assistance however, her right knee is tender medially. Patient says she is having pain when she puts weight on her left great toe and foot area. Patient otherwise denies any recent fevers, chills, chest pain, shortness of breath, constipation, diarrhea, dysuria.   The history is provided by the patient, medical records and the spouse. No language interpreter was used.  Fall  This is a new problem. The current episode started more than 2 days ago. The problem occurs constantly. The problem has not changed since onset.Associated symptoms include headaches (resolved). Pertinent negatives include no chest pain, no abdominal pain and no shortness of breath. The symptoms are aggravated by walking (right knee and left foot pain). Nothing relieves the symptoms. She has  tried nothing for the symptoms. The treatment provided no relief.    Past Medical History:  Diagnosis Date  . DEPRESSION   . DIABETES MELLITUS, TYPE I   . GERD   . GLAUCOMA   . Headache(784.0)   . Hepatitis B carrier    05/2009: neg Hep C; Hep B: core pos, Surf neg; fatty liver US 8/10 - 7/13  . HYPERLIPIDEMIA   . HYPERTENSION   . Kidney failure    dialysis 3 times per week  . OSTEOPENIA   . POSTMENOPAUSAL STATUS   . Unspecified vitamin D deficiency     Patient Active Problem List   Diagnosis Date Noted  . End stage renal disease ([place]) 06/25/2014  . CAP (community acquired pneumonia) 05/17/2014  . Malignant hypertension 05/17/2014  . Chest pain 05/17/2014  . Elevated troponin 05/17/2014  . Hypertensive emergency 05/17/2014  . NSTEMI (non-ST elevated myocardial infarction) ([place]) 05/17/2014  . Chronic kidney disease, stage IV (severe) ([place]) 12/05/2013  . GERD 06/17/2009  . Hepatitis B carrier 06/17/2009  . UNSPECIFIED VITAMIN D DEFICIENCY 05/21/2009  . DEPRESSION 05/20/2009  . GLAUCOMA 05/20/2009  . HEADACHE 05/20/2009  . CARDIAC MURMUR 05/20/2009  . OSTEOPENIA 02/15/2008  . POSTMENOPAUSAL STATUS 12/21/2007  . Hyperlipidemia LDL goal <70 09/21/2007  . Essential hypertension 09/13/2007  . Type II diabetes mellitus with nephropathy [facility])     Past Surgical History:  Procedure Laterality Date  . AV FISTULA PLACEMENT Left 05/29/2014   Procedure: INSERTION OF ARTERIOVENOUS (AV) GORE-TEX GRAFT ARM-LEFT;  Surgeon: Angelia Mould, MD;  Location: [facility];  Service: Vascular;  Laterality: Left;  .  CHOLECYSTECTOMY  02/12/07  . INSERTION OF DIALYSIS CATHETER Right 05/27/2014   Procedure: INSERTION OF DIALYSIS CATHETER;  Surgeon: Angelia Mould, MD;  Location: [facility];  Service: Vascular;  Laterality: Right;  . REFRACTIVE SURGERY  07/29/09   Dr. Bing Plume  . TONSILLECTOMY      OB History    No data available       Home Medications    Prior to Admission medications     Medication Sig Start Date End Date Taking? Authorizing Provider  amLODipine (NORVASC) 10 MG tablet TAKE 1 TABLET BY MOUTH EVERY DAY 12/30/15   Rosalita Chessman Chase, DO  calcium acetate (PHOSLO) 667 MG capsule Take 1 capsule (667 mg total) by mouth 2 (two) times daily. 07/22/15   Rosalita Chessman Chase, DO  carvedilol (COREG) 25 MG tablet Take 1 tablet (25 mg total) by mouth 2 (two) times daily with a meal. 07/22/15   Rosalita Chessman Chase, DO  furosemide (LASIX) 40 MG tablet TAKE 1 TABLET BY MOUTH EVERY DAY 12/30/15   Rosalita Chessman Chase, DO  isosorbide mononitrate (IMDUR) 30 MG 24 hr tablet Take 1 tablet (30 mg total) by mouth 2 (two) times daily. 07/22/15   Rosalita Chessman Chase, DO  lisinopril (PRINIVIL,ZESTRIL) 10 MG tablet Take 1 tablet (10 mg total) by mouth every evening. OFFICE VISIT DUE NOW 06/21/16   Rosalita Chessman Chase, DO  timolol (BETIMOL) 0.5 % ophthalmic solution Place 1 drop into the right eye at bedtime.     Historical Provider, MD    Family History Family History  Problem Relation Age of Onset  . Coronary artery disease Other   . Diabetes Other   . Hypertension Other   . Arthritis Other     Social History Social History  Substance Use Topics  . Smoking status: Never Smoker  . Smokeless tobacco: Never Used     Comment: Married x's 57yrs, 6 kids-scattered [employer]. Retired Consulting civil engineer  . Alcohol use No     Allergies   Hydralazine and Robaxin [methocarbamol]   Review of Systems Review of Systems  Constitutional: Negative for chills and fever.  HENT: Negative for congestion, facial swelling, rhinorrhea, sore throat and trouble swallowing.   Eyes: Negative for visual disturbance.  Respiratory: Negative for cough, chest tightness, shortness of breath, wheezing and stridor.   Cardiovascular: Negative for chest pain.  Gastrointestinal: Negative for abdominal distention, abdominal pain, diarrhea, nausea and vomiting.  Genitourinary: Negative for flank pain.   Musculoskeletal: Positive for neck pain. Negative for back pain and neck stiffness.  Skin: Negative for wound.  Neurological: Positive for headaches (resolved). Negative for dizziness, weakness, light-headedness and numbness.  Psychiatric/Behavioral: Negative for agitation and confusion.  All other systems reviewed and are negative.    Physical Exam Updated Vital Signs BP (!) 163/51 (BP Location: Left Arm)   Pulse 68   Temp 98.4 F (36.9 C) (Oral)   Resp 14   Ht 5' 6.5" (1.689 m)   Wt 140 lb (63.5 kg)   SpO2 98%   BMI 22.26 kg/m   Physical Exam  Constitutional: She is oriented to person, place, and time. She appears well-developed and well-nourished. No distress.  HENT:  Head: Head is with contusion. Head is without laceration.    Right Ear: External ear normal.  Left Ear: External ear normal.  Nose: Nose normal.  Mouth/Throat: Oropharynx is clear and moist. No oropharyngeal exudate.  Ecchymoses on right face. No trismus, normal extraocular movements without  pain. No vision changes. No sensation of malocclusion if the jaw. No neck stiffness however mild tenderness on neck.  Eyes: Conjunctivae and EOM are normal. Pupils are equal, round, and reactive to light. Right conjunctiva has no hemorrhage. Left conjunctiva has no hemorrhage. No scleral icterus. Pupils are equal.  Neck: Normal range of motion. Muscular tenderness present. No spinous process tenderness present. No neck rigidity. Normal range of motion present.    Cardiovascular: Normal rate, regular rhythm, normal heart sounds and intact distal pulses.   No murmur heard. Pulmonary/Chest: Effort normal and breath sounds normal. No stridor. No respiratory distress. She has no wheezes. She exhibits no tenderness.  Abdominal: Soft. There is no tenderness. There is no rebound. No hernia.  Musculoskeletal: She exhibits tenderness.       Right knee: She exhibits normal range of motion, no swelling, no ecchymosis, no deformity  and no laceration. Tenderness found. Medial joint line tenderness noted. No lateral joint line tenderness noted.       Legs:      Left foot: There is tenderness. There is normal range of motion, no swelling, no crepitus, no deformity and no laceration.       Feet:  Neurological: She is alert and oriented to person, place, and time. She has normal reflexes. She displays normal reflexes. No cranial nerve deficit or sensory deficit. She exhibits normal muscle tone. Coordination normal. GCS eye subscore is 4. GCS verbal subscore is 5. GCS motor subscore is 6.  Skin: Skin is warm. Capillary refill takes less than 2 seconds. She is not diaphoretic.  Psychiatric: She has a normal mood and affect.  Nursing note and vitals reviewed.    ED Treatments / Results  Labs (all labs ordered are listed, but only abnormal results are displayed) Labs Reviewed  CBC - Abnormal; Notable for the following:       Result Value   WBC 3.9 (*)    RBC 3.13 (*)    Hemoglobin 9.9 (*)    HCT 30.2 (*)    RDW 17.4 (*)    Platelets 107 (*)    All other components within normal limits  BASIC METABOLIC PANEL - Abnormal; Notable for the following:    Sodium 132 (*)    Chloride 90 (*)    BUN 71 (*)    Creatinine, Ser 7.45 (*)    GFR calc non Af Amer 5 (*)    GFR calc Af Amer 6 (*)    Anion gap 17 (*)    All other components within normal limits  PROTIME-INR    EKG  EKG Interpretation None       Radiology Dg Chest 2 View  Result Date: 07/20/2016 CLINICAL DATA:  Chest pain, recent fall from bed earlier this morning. Diabetes. Dialysis patient. EXAM: CHEST  2 VIEW COMPARISON:  05/27/2014 FINDINGS: Left axillary stent noted related to the dialysis access. Mild cardiomegaly without CHF or focal pneumonia. Lungs remain clear. No effusion or pneumothorax. Trachea midline. Atherosclerosis noted of the aorta. Remote cholecystectomy evident. Diffuse thoracic spondylosis and degenerative changes of the shoulders without  acute osseous finding. IMPRESSION: Stable chest exam with chronic findings as above. No superimposed acute process Thoracic aortic atherosclerosis Electronically Signed   By: Jerilynn Mages.  Shick M.D.   On: 07/20/2016 10:19   Ct Head Wo Contrast  Result Date: 07/20/2016 CLINICAL DATA:  [place] out of bed on Sunday night, rolled over and fell striking the RIGHT side of her face on the night stand, RIGHT  periorbital ecchymosis, denies loss of consciousness, history hypertension, type I diabetes mellitus, glaucoma, end-stage renal disease on dialysis EXAM: CT HEAD WITHOUT CONTRAST CT CERVICAL SPINE WITHOUT CONTRAST TECHNIQUE: Multidetector CT imaging of the head and cervical spine was performed following the standard protocol without intravenous contrast. Multiplanar CT image reconstructions of the cervical spine were also generated. COMPARISON:  CT head 01/08/2014 FINDINGS: CT HEAD FINDINGS Brain: Generalized atrophy. Normal ventricular morphology. No midline shift or mass effect. Mild small vessel chronic ischemic changes of deep cerebral white matter. No intracranial hemorrhage, mass lesion, evidence acute infarction, or extra-axial fluid collections. Vascular: Extensive atherosclerotic calcifications involving the internal carotid and vertebral arteries at the skullbase bilaterally. Skull: Intact Sinuses/Orbits: Clear Other: N/A CT CERVICAL SPINE FINDINGS Alignment: Normal alignment without subluxation Skull base and vertebrae: Skullbase intact. Vertebral body heights maintained. No vertebral fracture. Soft tissues and spinal canal: Unremarkable Disc levels: Scattered mild disc space narrowing and endplate spur formation Upper chest: Lung apices clear Other: Atherosclerotic calcifications at the carotid and vertebral arteries. IMPRESSION: Atrophy with small vessel chronic ischemic changes of deep cerebral white matter. No acute intracranial abnormalities. Degenerative disc and facet disease changes of the cervical spine. No  acute cervical spine abnormalities. Extensive atherosclerotic calcifications involving the carotid and vertebral systems bilaterally. Electronically Signed   By: Lavonia Dana M.D.   On: 07/20/2016 10:42   Ct Cervical Spine Wo Contrast  Result Date: 07/20/2016 CLINICAL DATA:  [place] out of bed on Sunday night, rolled over and fell striking the RIGHT side of her face on the night stand, RIGHT periorbital ecchymosis, denies loss of consciousness, history hypertension, type I diabetes mellitus, glaucoma, end-stage renal disease on dialysis EXAM: CT HEAD WITHOUT CONTRAST CT CERVICAL SPINE WITHOUT CONTRAST TECHNIQUE: Multidetector CT imaging of the head and cervical spine was performed following the standard protocol without intravenous contrast. Multiplanar CT image reconstructions of the cervical spine were also generated. COMPARISON:  CT head 01/08/2014 FINDINGS: CT HEAD FINDINGS Brain: Generalized atrophy. Normal ventricular morphology. No midline shift or mass effect. Mild small vessel chronic ischemic changes of deep cerebral white matter. No intracranial hemorrhage, mass lesion, evidence acute infarction, or extra-axial fluid collections. Vascular: Extensive atherosclerotic calcifications involving the internal carotid and vertebral arteries at the skullbase bilaterally. Skull: Intact Sinuses/Orbits: Clear Other: N/A CT CERVICAL SPINE FINDINGS Alignment: Normal alignment without subluxation Skull base and vertebrae: Skullbase intact. Vertebral body heights maintained. No vertebral fracture. Soft tissues and spinal canal: Unremarkable Disc levels: Scattered mild disc space narrowing and endplate spur formation Upper chest: Lung apices clear Other: Atherosclerotic calcifications at the carotid and vertebral arteries. IMPRESSION: Atrophy with small vessel chronic ischemic changes of deep cerebral white matter. No acute intracranial abnormalities. Degenerative disc and facet disease changes of the cervical spine. No  acute cervical spine abnormalities. Extensive atherosclerotic calcifications involving the carotid and vertebral systems bilaterally. Electronically Signed   By: Lavonia Dana M.D.   On: 07/20/2016 10:42   Dg Knee Complete 4 Views Right  Result Date: 07/20/2016 CLINICAL DATA:  Right knee pain after a fall of a bed this morning. EXAM: RIGHT KNEE - COMPLETE 4+ VIEW COMPARISON:  None. FINDINGS: There is no fracture or dislocation.  Small joint effusion. Extensive chondrocalcinosis. Extensive arterial vascular calcification.  Diffuse osteopenia. IMPRESSION: Small joint effusion. No acute bone abnormality. Chondrocalcinosis and osteopenia. Electronically Signed   By: Lorriane Shire M.D.   On: 07/20/2016 10:19   Dg Foot Complete Left  Result Date: 07/20/2016 CLINICAL DATA:  Left foot pain  after falling out of bed this morning. EXAM: LEFT FOOT - COMPLETE 3+ VIEW COMPARISON:  None. FINDINGS: There is no fracture or dislocation. Arthritis of the IP joints and of the first metatarsal phalangeal joint with bunion formation on the head of the first metatarsal. Arterial vascular calcifications. Osteopenia. IMPRESSION: No acute bone abnormality. Electronically Signed   By: Lorriane Shire M.D.   On: 07/20/2016 10:21    Procedures Procedures (including critical care time)  Medications Ordered in ED Medications - No data to display   Initial Impression / Assessment and Plan / ED Course  I have reviewed the triage vital signs and the nursing notes.  Pertinent labs & imaging results that were available during my care of the patient were reviewed by me and considered in my medical decision making (see chart for details).  Clinical Course    MOANA MUNFORD is a 75 y.o. female with a past medical history significant for hypertension, hyperlipidemia, diabetes, ESRD on dialysis Monday/Wednesday/Friday who presents after a fall Sunday evening causing injury to the right face, neck pain, right knee pain, and left  foot pain. Given patients obvious head trauma with bruising on face, patient had CT imaging to look for head or neck injury. Imaging results above and were negative for acute fracture or bleed. Patient had imaging of knee and foot which do not reveal any evidence of fracture or malalignment. Small effusion of knee was observed.  Screening laboratory testing performed without discovery of evidence suggesting infection. Patient had electrolyte abnormalities however, she is going to dialysis shortly after discharge. Patient had no other complaints and understood plan to follow up with her PCP. Patient understood return precautions for any new or worsening symptoms. Patient discharged in good condition with plans to go directly to dialysis. Patient given instructions on RICE therapy for her knee and foot.    Final Clinical Impressions(s) / ED Diagnoses   Final diagnoses:  Fall, initial encounter  Facial contusion, initial encounter  Right knee pain  Left foot pain    New Prescriptions Discharge Medication List as of 07/20/2016 11:08 AM      Clinical Impression: 1. Fall, initial encounter   2. Facial contusion, initial encounter   3. Right knee pain   4. Left foot pain     Disposition: Discharge  Condition: Good  I have discussed the results, Dx and Tx plan with the pt(& family if present). He/she/they expressed understanding and agree(s) with the plan. Discharge instructions discussed at great length. Strict return precautions discussed and pt &/or family have verbalized understanding of the instructions. No further questions at time of discharge.    Discharge Medication List as of 07/20/2016 11:08 AM      Follow Up: Ann Held, DO [address] [place] [address] [phone]  Schedule an appointment as soon as possible for a visit  If symptoms worsen, please return to the nearest ED.     Courtney Paris, MD 07/20/16 2012

## 2016-07-20 NOTE — ED Notes (Signed)
Dr. Sherry Ruffing has just spoken with pt. And her husband.

## 2016-07-20 NOTE — Progress Notes (Signed)
CSW spoke with patient at bedside with husband present. Patient reports she was in bed, rolled over, kept falling and hit her head. Patient reports her toe and knee are "real bad". Patient reports she and her husband live in the home. Patient reports she does not use any equipment and she completes her own ADL's. No question noted for CSW during this visit.   [name] [name] [phone] ED CSW 07/20/2016 9:57 AM

## 2016-07-20 NOTE — ED Triage Notes (Signed)
Pt reports she fell off of her bed Sunday night.  States she rolled over and fell, hitting the R side of her face on her night stand.  Ecchymosis noted around her R eye.  Pt denies LOC.  Pt reports R knee pain and L big toe pain unable to bend it.  Pt is on blood thinners.  Denies any h/a, nausea or dizziness at this time.

## 2016-07-22 DIAGNOSIS — N186 End stage renal disease: Secondary | ICD-10-CM | POA: Diagnosis not present

## 2016-07-25 DIAGNOSIS — N186 End stage renal disease: Secondary | ICD-10-CM | POA: Diagnosis not present

## 2016-07-27 DIAGNOSIS — N186 End stage renal disease: Secondary | ICD-10-CM | POA: Diagnosis not present

## 2016-07-28 DIAGNOSIS — Z992 Dependence on renal dialysis: Secondary | ICD-10-CM | POA: Diagnosis not present

## 2016-07-28 DIAGNOSIS — I871 Compression of vein: Secondary | ICD-10-CM | POA: Diagnosis not present

## 2016-07-28 DIAGNOSIS — T82858D Stenosis of vascular prosthetic devices, implants and grafts, subsequent encounter: Secondary | ICD-10-CM | POA: Diagnosis not present

## 2016-07-28 DIAGNOSIS — N186 End stage renal disease: Secondary | ICD-10-CM | POA: Diagnosis not present

## 2016-07-29 DIAGNOSIS — N186 End stage renal disease: Secondary | ICD-10-CM | POA: Diagnosis not present

## 2016-07-30 DIAGNOSIS — E1122 Type 2 diabetes mellitus with diabetic chronic kidney disease: Secondary | ICD-10-CM | POA: Diagnosis not present

## 2016-07-30 DIAGNOSIS — N186 End stage renal disease: Secondary | ICD-10-CM | POA: Diagnosis not present

## 2016-07-30 DIAGNOSIS — Z992 Dependence on renal dialysis: Secondary | ICD-10-CM | POA: Diagnosis not present

## 2016-08-01 DIAGNOSIS — N186 End stage renal disease: Secondary | ICD-10-CM | POA: Diagnosis not present

## 2016-08-03 DIAGNOSIS — N186 End stage renal disease: Secondary | ICD-10-CM | POA: Diagnosis not present

## 2016-08-05 DIAGNOSIS — N186 End stage renal disease: Secondary | ICD-10-CM | POA: Diagnosis not present

## 2016-08-08 DIAGNOSIS — N186 End stage renal disease: Secondary | ICD-10-CM | POA: Diagnosis not present

## 2016-08-10 DIAGNOSIS — N186 End stage renal disease: Secondary | ICD-10-CM | POA: Diagnosis not present

## 2016-08-12 DIAGNOSIS — N186 End stage renal disease: Secondary | ICD-10-CM | POA: Diagnosis not present

## 2016-08-15 DIAGNOSIS — N186 End stage renal disease: Secondary | ICD-10-CM | POA: Diagnosis not present

## 2016-08-16 ENCOUNTER — Other Ambulatory Visit: Payer: Self-pay | Admitting: Family Medicine

## 2016-08-17 DIAGNOSIS — N186 End stage renal disease: Secondary | ICD-10-CM | POA: Diagnosis not present

## 2016-08-19 DIAGNOSIS — N186 End stage renal disease: Secondary | ICD-10-CM | POA: Diagnosis not present

## 2016-08-22 DIAGNOSIS — N186 End stage renal disease: Secondary | ICD-10-CM | POA: Diagnosis not present

## 2016-08-24 DIAGNOSIS — N186 End stage renal disease: Secondary | ICD-10-CM | POA: Diagnosis not present

## 2016-08-26 DIAGNOSIS — N186 End stage renal disease: Secondary | ICD-10-CM | POA: Diagnosis not present

## 2016-08-29 DIAGNOSIS — N186 End stage renal disease: Secondary | ICD-10-CM | POA: Diagnosis not present

## 2016-08-30 DIAGNOSIS — E1122 Type 2 diabetes mellitus with diabetic chronic kidney disease: Secondary | ICD-10-CM | POA: Diagnosis not present

## 2016-08-30 DIAGNOSIS — Z992 Dependence on renal dialysis: Secondary | ICD-10-CM | POA: Diagnosis not present

## 2016-08-30 DIAGNOSIS — N186 End stage renal disease: Secondary | ICD-10-CM | POA: Diagnosis not present

## 2016-08-31 DIAGNOSIS — N186 End stage renal disease: Secondary | ICD-10-CM | POA: Diagnosis not present

## 2016-09-02 DIAGNOSIS — N186 End stage renal disease: Secondary | ICD-10-CM | POA: Diagnosis not present

## 2016-09-05 DIAGNOSIS — N186 End stage renal disease: Secondary | ICD-10-CM | POA: Diagnosis not present

## 2016-09-07 DIAGNOSIS — N186 End stage renal disease: Secondary | ICD-10-CM | POA: Diagnosis not present

## 2016-09-09 ENCOUNTER — Other Ambulatory Visit: Payer: Self-pay | Admitting: Family Medicine

## 2016-09-09 DIAGNOSIS — I10 Essential (primary) hypertension: Secondary | ICD-10-CM

## 2016-09-09 DIAGNOSIS — N186 End stage renal disease: Secondary | ICD-10-CM | POA: Diagnosis not present

## 2016-09-12 DIAGNOSIS — N186 End stage renal disease: Secondary | ICD-10-CM | POA: Diagnosis not present

## 2016-09-12 NOTE — Telephone Encounter (Signed)
Last seen 07/21/15, no future appointments scheduled. Please advise.

## 2016-09-14 ENCOUNTER — Other Ambulatory Visit: Payer: Self-pay | Admitting: Family Medicine

## 2016-09-14 DIAGNOSIS — N186 End stage renal disease: Secondary | ICD-10-CM | POA: Diagnosis not present

## 2016-09-16 DIAGNOSIS — N186 End stage renal disease: Secondary | ICD-10-CM | POA: Diagnosis not present

## 2016-09-19 ENCOUNTER — Telehealth: Payer: Self-pay | Admitting: Family Medicine

## 2016-09-19 NOTE — Telephone Encounter (Signed)
Caller name: Relationship to patient: Self Can be reached: [phone]  Pharmacy:  Reason for call: Patient's dialysis schedule was changed this week dure to the holiday and she can not make her appointment tomorrow. Wants to know if she can be worked in one day next week so that she does not have to cancel her dialysis tomorrow. Plse adv

## 2016-09-19 NOTE — Telephone Encounter (Signed)
Patient's dialysis schedule was changed this week dure to the holiday and she can not make her appointment tomorrow. Wants to know if she can be worked in one day next week so that she does not have to cancel her dialysis tomorrow. Plse adv

## 2016-09-19 NOTE — Telephone Encounter (Signed)
11 am on Monday?

## 2016-09-20 ENCOUNTER — Ambulatory Visit: Payer: Commercial Managed Care - HMO | Admitting: Family Medicine

## 2016-09-20 ENCOUNTER — Ambulatory Visit: Payer: Commercial Managed Care - HMO | Admitting: *Deleted

## 2016-09-20 ENCOUNTER — Telehealth: Payer: Self-pay

## 2016-09-20 DIAGNOSIS — Z0289 Encounter for other administrative examinations: Secondary | ICD-10-CM

## 2016-09-20 DIAGNOSIS — N186 End stage renal disease: Secondary | ICD-10-CM | POA: Diagnosis not present

## 2016-09-20 NOTE — Telephone Encounter (Signed)
Called to inform pt that we were able to get her an appointment for Monday 09/26/16 at 11am, but the number list in pt's chart just ring and no vm to leave message. Will try to call again later today.LB

## 2016-09-23 ENCOUNTER — Other Ambulatory Visit: Payer: Self-pay | Admitting: Family Medicine

## 2016-09-23 DIAGNOSIS — N184 Chronic kidney disease, stage 4 (severe): Secondary | ICD-10-CM

## 2016-09-23 DIAGNOSIS — N186 End stage renal disease: Secondary | ICD-10-CM | POA: Diagnosis not present

## 2016-09-26 ENCOUNTER — Ambulatory Visit: Payer: Commercial Managed Care - HMO | Admitting: Family Medicine

## 2016-09-26 DIAGNOSIS — Z0289 Encounter for other administrative examinations: Secondary | ICD-10-CM

## 2016-09-26 DIAGNOSIS — N186 End stage renal disease: Secondary | ICD-10-CM | POA: Diagnosis not present

## 2016-09-27 NOTE — Telephone Encounter (Signed)
Medication filled to pharmacy as requested.  Next Appt:10/04/16.

## 2016-09-28 DIAGNOSIS — N186 End stage renal disease: Secondary | ICD-10-CM | POA: Diagnosis not present

## 2016-09-29 ENCOUNTER — Encounter: Payer: Self-pay | Admitting: Family Medicine

## 2016-09-29 DIAGNOSIS — N186 End stage renal disease: Secondary | ICD-10-CM | POA: Diagnosis not present

## 2016-09-29 DIAGNOSIS — E1122 Type 2 diabetes mellitus with diabetic chronic kidney disease: Secondary | ICD-10-CM | POA: Diagnosis not present

## 2016-09-29 DIAGNOSIS — Z992 Dependence on renal dialysis: Secondary | ICD-10-CM | POA: Diagnosis not present

## 2016-09-30 DIAGNOSIS — N186 End stage renal disease: Secondary | ICD-10-CM | POA: Diagnosis not present

## 2016-10-03 DIAGNOSIS — N186 End stage renal disease: Secondary | ICD-10-CM | POA: Diagnosis not present

## 2016-10-04 ENCOUNTER — Ambulatory Visit: Payer: Commercial Managed Care - HMO | Admitting: Family Medicine

## 2016-10-04 DIAGNOSIS — Z0289 Encounter for other administrative examinations: Secondary | ICD-10-CM

## 2016-10-05 ENCOUNTER — Encounter: Payer: Self-pay | Admitting: Family Medicine

## 2016-10-05 DIAGNOSIS — N186 End stage renal disease: Secondary | ICD-10-CM | POA: Diagnosis not present

## 2016-10-05 NOTE — Telephone Encounter (Signed)
error 

## 2016-10-07 DIAGNOSIS — N186 End stage renal disease: Secondary | ICD-10-CM | POA: Diagnosis not present

## 2016-10-10 DIAGNOSIS — N186 End stage renal disease: Secondary | ICD-10-CM | POA: Diagnosis not present

## 2016-10-11 DIAGNOSIS — K006 Disturbances in tooth eruption: Secondary | ICD-10-CM | POA: Diagnosis not present

## 2016-10-12 DIAGNOSIS — N186 End stage renal disease: Secondary | ICD-10-CM | POA: Diagnosis not present

## 2016-10-13 ENCOUNTER — Other Ambulatory Visit: Payer: Self-pay | Admitting: Family Medicine

## 2016-10-14 DIAGNOSIS — N186 End stage renal disease: Secondary | ICD-10-CM | POA: Diagnosis not present

## 2016-10-17 DIAGNOSIS — N186 End stage renal disease: Secondary | ICD-10-CM | POA: Diagnosis not present

## 2016-10-19 DIAGNOSIS — N186 End stage renal disease: Secondary | ICD-10-CM | POA: Diagnosis not present

## 2016-10-21 DIAGNOSIS — N186 End stage renal disease: Secondary | ICD-10-CM | POA: Diagnosis not present

## 2016-10-22 DIAGNOSIS — N186 End stage renal disease: Secondary | ICD-10-CM | POA: Diagnosis not present

## 2016-10-26 DIAGNOSIS — N186 End stage renal disease: Secondary | ICD-10-CM | POA: Diagnosis not present

## 2016-10-28 DIAGNOSIS — N186 End stage renal disease: Secondary | ICD-10-CM | POA: Diagnosis not present

## 2016-10-30 DIAGNOSIS — E1122 Type 2 diabetes mellitus with diabetic chronic kidney disease: Secondary | ICD-10-CM | POA: Diagnosis not present

## 2016-10-30 DIAGNOSIS — Z992 Dependence on renal dialysis: Secondary | ICD-10-CM | POA: Diagnosis not present

## 2016-10-30 DIAGNOSIS — N186 End stage renal disease: Secondary | ICD-10-CM | POA: Diagnosis not present

## 2016-11-01 ENCOUNTER — Other Ambulatory Visit: Payer: Self-pay | Admitting: Family Medicine

## 2016-11-01 DIAGNOSIS — N184 Chronic kidney disease, stage 4 (severe): Secondary | ICD-10-CM

## 2016-11-01 NOTE — Telephone Encounter (Signed)
Received Rx request for Imdur 30mg  24hr tablets; #60,0 Rf; 1 tablet po BID).  Last Rf: 09/27/2016 Last Ov: 07/21/2015. Was supposed to be seen in the Office around 01/18/2016, but has yet to be seen.  Please advise.

## 2016-11-02 DIAGNOSIS — N186 End stage renal disease: Secondary | ICD-10-CM | POA: Diagnosis not present

## 2016-11-02 DIAGNOSIS — N2581 Secondary hyperparathyroidism of renal origin: Secondary | ICD-10-CM | POA: Diagnosis not present

## 2016-11-04 DIAGNOSIS — N2581 Secondary hyperparathyroidism of renal origin: Secondary | ICD-10-CM | POA: Diagnosis not present

## 2016-11-04 DIAGNOSIS — N186 End stage renal disease: Secondary | ICD-10-CM | POA: Diagnosis not present

## 2016-11-07 DIAGNOSIS — N186 End stage renal disease: Secondary | ICD-10-CM | POA: Diagnosis not present

## 2016-11-07 DIAGNOSIS — N2581 Secondary hyperparathyroidism of renal origin: Secondary | ICD-10-CM | POA: Diagnosis not present

## 2016-11-08 DIAGNOSIS — N2581 Secondary hyperparathyroidism of renal origin: Secondary | ICD-10-CM | POA: Diagnosis not present

## 2016-11-08 DIAGNOSIS — N186 End stage renal disease: Secondary | ICD-10-CM | POA: Diagnosis not present

## 2016-11-09 ENCOUNTER — Telehealth: Payer: Self-pay | Admitting: Family Medicine

## 2016-11-09 NOTE — Telephone Encounter (Signed)
lvm advising patient to call back and [facility] medicare wellness appointment.

## 2016-11-10 ENCOUNTER — Other Ambulatory Visit: Payer: Self-pay | Admitting: Family Medicine

## 2016-11-12 DIAGNOSIS — N186 End stage renal disease: Secondary | ICD-10-CM | POA: Diagnosis not present

## 2016-11-12 DIAGNOSIS — N2581 Secondary hyperparathyroidism of renal origin: Secondary | ICD-10-CM | POA: Diagnosis not present

## 2016-11-14 DIAGNOSIS — N2581 Secondary hyperparathyroidism of renal origin: Secondary | ICD-10-CM | POA: Diagnosis not present

## 2016-11-14 DIAGNOSIS — N186 End stage renal disease: Secondary | ICD-10-CM | POA: Diagnosis not present

## 2016-11-15 DIAGNOSIS — T82858A Stenosis of vascular prosthetic devices, implants and grafts, initial encounter: Secondary | ICD-10-CM | POA: Diagnosis not present

## 2016-11-15 DIAGNOSIS — N186 End stage renal disease: Secondary | ICD-10-CM | POA: Diagnosis not present

## 2016-11-15 DIAGNOSIS — Z992 Dependence on renal dialysis: Secondary | ICD-10-CM | POA: Diagnosis not present

## 2016-11-15 DIAGNOSIS — I871 Compression of vein: Secondary | ICD-10-CM | POA: Diagnosis not present

## 2016-11-18 DIAGNOSIS — N186 End stage renal disease: Secondary | ICD-10-CM | POA: Diagnosis not present

## 2016-11-18 DIAGNOSIS — N2581 Secondary hyperparathyroidism of renal origin: Secondary | ICD-10-CM | POA: Diagnosis not present

## 2016-11-21 DIAGNOSIS — N186 End stage renal disease: Secondary | ICD-10-CM | POA: Diagnosis not present

## 2016-11-21 DIAGNOSIS — N2581 Secondary hyperparathyroidism of renal origin: Secondary | ICD-10-CM | POA: Diagnosis not present

## 2016-11-23 DIAGNOSIS — N186 End stage renal disease: Secondary | ICD-10-CM | POA: Diagnosis not present

## 2016-11-23 DIAGNOSIS — N2581 Secondary hyperparathyroidism of renal origin: Secondary | ICD-10-CM | POA: Diagnosis not present

## 2016-11-25 ENCOUNTER — Emergency Department (HOSPITAL_COMMUNITY): Payer: Medicare HMO

## 2016-11-25 ENCOUNTER — Encounter (HOSPITAL_COMMUNITY): Payer: Self-pay | Admitting: *Deleted

## 2016-11-25 ENCOUNTER — Inpatient Hospital Stay (HOSPITAL_COMMUNITY)
Admission: EM | Admit: 2016-11-25 | Discharge: 2016-11-27 | DRG: 189 | Disposition: A | Discharge status: Home Health | Payer: Medicare HMO | Attending: Internal Medicine | Admitting: Internal Medicine

## 2016-11-25 DIAGNOSIS — Z9115 Patient's noncompliance with renal dialysis: Secondary | ICD-10-CM | POA: Diagnosis not present

## 2016-11-25 DIAGNOSIS — E8779 Other fluid overload: Secondary | ICD-10-CM | POA: Diagnosis present

## 2016-11-25 DIAGNOSIS — I509 Heart failure, unspecified: Secondary | ICD-10-CM | POA: Diagnosis not present

## 2016-11-25 DIAGNOSIS — E1021 Type 1 diabetes mellitus with diabetic nephropathy: Secondary | ICD-10-CM | POA: Diagnosis not present

## 2016-11-25 DIAGNOSIS — N186 End stage renal disease: Secondary | ICD-10-CM | POA: Diagnosis not present

## 2016-11-25 DIAGNOSIS — I252 Old myocardial infarction: Secondary | ICD-10-CM | POA: Diagnosis not present

## 2016-11-25 DIAGNOSIS — K219 Gastro-esophageal reflux disease without esophagitis: Secondary | ICD-10-CM | POA: Diagnosis not present

## 2016-11-25 DIAGNOSIS — Z5329 Procedure and treatment not carried out because of patient's decision for other reasons: Secondary | ICD-10-CM | POA: Diagnosis not present

## 2016-11-25 DIAGNOSIS — B181 Chronic viral hepatitis B without delta-agent: Secondary | ICD-10-CM | POA: Diagnosis not present

## 2016-11-25 DIAGNOSIS — J9601 Acute respiratory failure with hypoxia: Secondary | ICD-10-CM | POA: Diagnosis not present

## 2016-11-25 DIAGNOSIS — I5033 Acute on chronic diastolic (congestive) heart failure: Secondary | ICD-10-CM | POA: Diagnosis present

## 2016-11-25 DIAGNOSIS — Z992 Dependence on renal dialysis: Secondary | ICD-10-CM

## 2016-11-25 DIAGNOSIS — N2581 Secondary hyperparathyroidism of renal origin: Secondary | ICD-10-CM | POA: Diagnosis present

## 2016-11-25 DIAGNOSIS — M8580 Other specified disorders of bone density and structure, unspecified site: Secondary | ICD-10-CM | POA: Diagnosis present

## 2016-11-25 DIAGNOSIS — J810 Acute pulmonary edema: Secondary | ICD-10-CM | POA: Diagnosis not present

## 2016-11-25 DIAGNOSIS — I132 Hypertensive heart and chronic kidney disease with heart failure and with stage 5 chronic kidney disease, or end stage renal disease: Secondary | ICD-10-CM | POA: Diagnosis not present

## 2016-11-25 DIAGNOSIS — E1121 Type 2 diabetes mellitus with diabetic nephropathy: Secondary | ICD-10-CM | POA: Diagnosis not present

## 2016-11-25 DIAGNOSIS — H409 Unspecified glaucoma: Secondary | ICD-10-CM | POA: Diagnosis present

## 2016-11-25 DIAGNOSIS — I5043 Acute on chronic combined systolic (congestive) and diastolic (congestive) heart failure: Secondary | ICD-10-CM | POA: Diagnosis not present

## 2016-11-25 DIAGNOSIS — R5381 Other malaise: Secondary | ICD-10-CM | POA: Diagnosis present

## 2016-11-25 DIAGNOSIS — Z888 Allergy status to other drugs, medicaments and biological substances status: Secondary | ICD-10-CM

## 2016-11-25 DIAGNOSIS — R0602 Shortness of breath: Secondary | ICD-10-CM | POA: Diagnosis not present

## 2016-11-25 DIAGNOSIS — K760 Fatty (change of) liver, not elsewhere classified: Secondary | ICD-10-CM | POA: Diagnosis present

## 2016-11-25 DIAGNOSIS — E785 Hyperlipidemia, unspecified: Secondary | ICD-10-CM | POA: Diagnosis present

## 2016-11-25 DIAGNOSIS — I10 Essential (primary) hypertension: Secondary | ICD-10-CM | POA: Diagnosis present

## 2016-11-25 DIAGNOSIS — I161 Hypertensive emergency: Secondary | ICD-10-CM | POA: Diagnosis present

## 2016-11-25 DIAGNOSIS — R112 Nausea with vomiting, unspecified: Secondary | ICD-10-CM | POA: Diagnosis present

## 2016-11-25 DIAGNOSIS — Z8249 Family history of ischemic heart disease and other diseases of the circulatory system: Secondary | ICD-10-CM

## 2016-11-25 DIAGNOSIS — E1022 Type 1 diabetes mellitus with diabetic chronic kidney disease: Secondary | ICD-10-CM | POA: Diagnosis present

## 2016-11-25 DIAGNOSIS — I5032 Chronic diastolic (congestive) heart failure: Secondary | ICD-10-CM | POA: Diagnosis not present

## 2016-11-25 DIAGNOSIS — R197 Diarrhea, unspecified: Secondary | ICD-10-CM | POA: Diagnosis present

## 2016-11-25 DIAGNOSIS — Z79899 Other long term (current) drug therapy: Secondary | ICD-10-CM

## 2016-11-25 DIAGNOSIS — D631 Anemia in chronic kidney disease: Secondary | ICD-10-CM | POA: Diagnosis present

## 2016-11-25 DIAGNOSIS — J811 Chronic pulmonary edema: Secondary | ICD-10-CM | POA: Diagnosis present

## 2016-11-25 LAB — COMPREHENSIVE METABOLIC PANEL
ALT: 31 U/L (ref 14–54)
ANION GAP: 12 (ref 5–15)
AST: 53 U/L — ABNORMAL HIGH (ref 15–41)
Albumin: 3.6 g/dL (ref 3.5–5.0)
Alkaline Phosphatase: 153 U/L — ABNORMAL HIGH (ref 38–126)
BILIRUBIN TOTAL: 0.5 mg/dL (ref 0.3–1.2)
BUN: 53 mg/dL — ABNORMAL HIGH (ref 6–20)
CHLORIDE: 99 mmol/L — AB (ref 101–111)
CO2: 26 mmol/L (ref 22–32)
Calcium: 8.1 mg/dL — ABNORMAL LOW (ref 8.9–10.3)
Creatinine, Ser: 6 mg/dL — ABNORMAL HIGH (ref 0.44–1.00)
GFR, EST AFRICAN AMERICAN: 7 mL/min — AB (ref >60)
GFR, EST NON AFRICAN AMERICAN: 6 mL/min — AB (ref >60)
Glucose, Bld: 99 mg/dL (ref 65–99)
POTASSIUM: 4.2 mmol/L (ref 3.5–5.1)
Sodium: 137 mmol/L (ref 135–145)
TOTAL PROTEIN: 7.6 g/dL (ref 6.5–8.1)

## 2016-11-25 LAB — I-STAT CHEM 8, ED
BUN: 47 mg/dL — ABNORMAL HIGH (ref 6–20)
CREATININE: 6.4 mg/dL — AB (ref 0.44–1.00)
Calcium, Ion: 0.96 mmol/L — ABNORMAL LOW (ref 1.15–1.40)
Chloride: 100 mmol/L — ABNORMAL LOW (ref 101–111)
Glucose, Bld: 100 mg/dL — ABNORMAL HIGH (ref 65–99)
HEMATOCRIT: 35 % — AB (ref 36.0–46.0)
HEMOGLOBIN: 11.9 g/dL — AB (ref 12.0–15.0)
POTASSIUM: 4.2 mmol/L (ref 3.5–5.1)
SODIUM: 137 mmol/L (ref 135–145)
TCO2: 27 mmol/L (ref 0–100)

## 2016-11-25 LAB — CBC WITH DIFFERENTIAL/PLATELET
BASOS ABS: 0 10*3/uL (ref 0.0–0.1)
Basophils Relative: 1 %
EOS ABS: 0.2 10*3/uL (ref 0.0–0.7)
Eosinophils Relative: 3 %
HCT: 33.2 % — ABNORMAL LOW (ref 36.0–46.0)
HEMOGLOBIN: 10.2 g/dL — AB (ref 12.0–15.0)
LYMPHS ABS: 0.9 10*3/uL (ref 0.7–4.0)
LYMPHS PCT: 16 %
MCH: 28 pg (ref 26.0–34.0)
MCHC: 30.7 g/dL (ref 30.0–36.0)
MCV: 91.2 fL (ref 78.0–100.0)
Monocytes Absolute: 0.3 10*3/uL (ref 0.1–1.0)
Monocytes Relative: 6 %
NEUTROS PCT: 74 %
Neutro Abs: 4 10*3/uL (ref 1.7–7.7)
PLATELETS: 146 10*3/uL — AB (ref 150–400)
RBC: 3.64 MIL/uL — ABNORMAL LOW (ref 3.87–5.11)
RDW: 19.3 % — ABNORMAL HIGH (ref 11.5–15.5)
WBC: 5.3 10*3/uL (ref 4.0–10.5)

## 2016-11-25 LAB — URINALYSIS, ROUTINE W REFLEX MICROSCOPIC
BILIRUBIN URINE: NEGATIVE
GLUCOSE, UA: NEGATIVE mg/dL
HGB URINE DIPSTICK: NEGATIVE
KETONES UR: NEGATIVE mg/dL
LEUKOCYTES UA: NEGATIVE
NITRITE: NEGATIVE
PH: 8 (ref 5.0–8.0)
Specific Gravity, Urine: 1.01 (ref 1.005–1.030)

## 2016-11-25 LAB — I-STAT TROPONIN, ED: TROPONIN I, POC: 0.06 ng/mL (ref 0.00–0.08)

## 2016-11-25 LAB — GLUCOSE, CAPILLARY: Glucose-Capillary: 87 mg/dL (ref 65–99)

## 2016-11-25 LAB — MRSA PCR SCREENING: MRSA by PCR: NEGATIVE

## 2016-11-25 MED ORDER — SODIUM CHLORIDE 0.9% FLUSH: 3.0000 mL | INTRAVENOUS | Status: DC | PRN

## 2016-11-25 MED ORDER — HEPARIN SODIUM (PORCINE) 5000 UNIT/ML IJ SOLN
5000.0000 [IU] | Freq: Three times a day (TID) | INTRAMUSCULAR | Status: DC
  Administered 2016-11-25 – 2016-11-26 (×3): 5000 [IU] via SUBCUTANEOUS
  Filled 2016-11-25 (×3): qty 1

## 2016-11-25 MED ORDER — NITROGLYCERIN IN D5W 200-5 MCG/ML-% IV SOLN
0.0000 ug/min | INTRAVENOUS | Status: DC
  Administered 2016-11-25: 20 ug/min via INTRAVENOUS
  Filled 2016-11-25: qty 250

## 2016-11-25 MED ORDER — INSULIN ASPART 100 UNIT/ML ~~LOC~~ SOLN
0.0000 [IU] | Freq: Three times a day (TID) | SUBCUTANEOUS | Status: DC
  Administered 2016-11-26: 1 [IU] via SUBCUTANEOUS

## 2016-11-25 MED ORDER — FUROSEMIDE 10 MG/ML IJ SOLN
80.0000 mg | Freq: Once | INTRAMUSCULAR | Status: AC
Start: 2016-11-25 — End: 2016-11-25
  Administered 2016-11-25: 80 mg via INTRAVENOUS
  Filled 2016-11-25: qty 8

## 2016-11-25 MED ORDER — NITROGLYCERIN IN D5W 200-5 MCG/ML-% IV SOLN
0.0000 ug/min | INTRAVENOUS | Status: DC
  Administered 2016-11-26: 60 ug/min via INTRAVENOUS
  Administered 2016-11-26: 45 ug/min via INTRAVENOUS
  Filled 2016-11-25: qty 250

## 2016-11-25 MED ORDER — BLISTEX MEDICATED EX OINT
TOPICAL_OINTMENT | CUTANEOUS | Status: DC | PRN
  Filled 2016-11-25: qty 6.3

## 2016-11-25 MED ORDER — AMLODIPINE BESYLATE 10 MG PO TABS
10.0000 mg | ORAL_TABLET | Freq: Every day | ORAL | Status: DC
  Administered 2016-11-26 – 2016-11-27 (×2): 10 mg via ORAL
  Filled 2016-11-25 (×2): qty 1

## 2016-11-25 MED ORDER — SODIUM CHLORIDE 0.9% FLUSH
3.0000 mL | Freq: Two times a day (BID) | INTRAVENOUS | Status: DC
  Administered 2016-11-26: 3 mL via INTRAVENOUS

## 2016-11-25 MED ORDER — FUROSEMIDE 40 MG PO TABS: 40.0000 mg | ORAL_TABLET | Freq: Every day | ORAL | Status: DC

## 2016-11-25 MED ORDER — ISOSORBIDE MONONITRATE ER 30 MG PO TB24
30.0000 mg | ORAL_TABLET | Freq: Two times a day (BID) | ORAL | Status: DC
  Administered 2016-11-26 – 2016-11-27 (×3): 30 mg via ORAL
  Filled 2016-11-25 (×3): qty 1

## 2016-11-25 MED ORDER — ORAL CARE MOUTH RINSE
15.0000 mL | Freq: Two times a day (BID) | OROMUCOSAL | Status: DC
  Administered 2016-11-26 (×2): 15 mL via OROMUCOSAL

## 2016-11-25 MED ORDER — CALCITRIOL 0.5 MCG PO CAPS
0.7500 ug | ORAL_CAPSULE | ORAL | Status: DC
  Filled 2016-11-25: qty 1

## 2016-11-25 MED ORDER — FUROSEMIDE 10 MG/ML IJ SOLN
40.0000 mg | Freq: Once | INTRAMUSCULAR | Status: AC
  Administered 2016-11-25: 40 mg via INTRAVENOUS
  Filled 2016-11-25: qty 4

## 2016-11-25 MED ORDER — CARVEDILOL 25 MG PO TABS
25.0000 mg | ORAL_TABLET | Freq: Two times a day (BID) | ORAL | Status: DC
  Administered 2016-11-26 – 2016-11-27 (×3): 25 mg via ORAL
  Filled 2016-11-25 (×3): qty 1

## 2016-11-25 MED ORDER — CHLORHEXIDINE GLUCONATE 0.12 % MT SOLN
15.0000 mL | Freq: Two times a day (BID) | OROMUCOSAL | Status: DC
  Administered 2016-11-25 – 2016-11-26 (×3): 15 mL via OROMUCOSAL
  Filled 2016-11-25 (×3): qty 15

## 2016-11-25 MED ORDER — ACETAMINOPHEN 650 MG RE SUPP: 650.0000 mg | Freq: Four times a day (QID) | RECTAL | Status: DC | PRN

## 2016-11-25 MED ORDER — SODIUM CHLORIDE 0.9 % IV SOLN: 250.0000 mL | INTRAVENOUS | Status: DC | PRN

## 2016-11-25 MED ORDER — ACETAMINOPHEN 325 MG PO TABS
650.0000 mg | ORAL_TABLET | Freq: Four times a day (QID) | ORAL | Status: DC | PRN
  Administered 2016-11-26 (×2): 650 mg via ORAL
  Filled 2016-11-25 (×2): qty 2

## 2016-11-25 MED ORDER — LISINOPRIL 40 MG PO TABS
40.0000 mg | ORAL_TABLET | Freq: Every day | ORAL | Status: DC
  Administered 2016-11-26: 40 mg via ORAL
  Filled 2016-11-25: qty 1

## 2016-11-25 NOTE — ED Provider Notes (Signed)
[name] Provider Note   CSN: [id number] Arrival date & time: 11/25/16  1539     History   Chief Complaint Chief Complaint  Patient presents with  . Respiratory Distress    HPI [name] [name] is a 76 y.o. female.  The history is provided by the patient and the spouse. No language interpreter was used.   [name] [name] is a 76 y.o. female who presents to the Emergency Department complaining of SOB. Level V caveat due to respiratory distress.  History is provided by the patient's husband. Husband reports that she felt poorly today with nausea, diarrhea and mild vomiting. This afternoon she developed abrupt onset shortness of breath with nonproductive cough. No fevers, chest pain, abdominal pain. She has a history of end-stage renal disease on hemodialysis. She dialyzes Monday, Wednesday, Friday. Her last session was on Wednesday. She had called to cancel her dialysis session today because she felt poorly this morning. She has a history of similar symptoms 2 and half years ago was diagnosed with acute onset renal failure. Past Medical History:  Diagnosis Date  . DEPRESSION   . DIABETES MELLITUS, TYPE I   . GERD   . GLAUCOMA   . Headache(784.0)   . Hepatitis B carrier ([facility])    05/2009: neg Hep C; Hep B: core pos, Surf neg; fatty liver US 8/10 - 7/13  . HYPERLIPIDEMIA   . HYPERTENSION   . Kidney failure    dialysis 3 times per week  . OSTEOPENIA   . POSTMENOPAUSAL STATUS   . Unspecified vitamin D deficiency     Patient Active Problem List   Diagnosis Date Noted  . Pulmonary edema 11/25/2016  . End stage renal disease ([place]) 06/25/2014  . CAP (community acquired pneumonia) 05/17/2014  . Malignant hypertension 05/17/2014  . Chest pain 05/17/2014  . Elevated troponin 05/17/2014  . Hypertensive emergency 05/17/2014  . NSTEMI (non-ST elevated myocardial infarction) ([place]) 05/17/2014  . Chronic kidney disease, stage IV (severe) ([place]) 12/05/2013  . GERD 06/17/2009  .  Hepatitis B carrier ([place]) 06/17/2009  . UNSPECIFIED VITAMIN D DEFICIENCY 05/21/2009  . DEPRESSION 05/20/2009  . GLAUCOMA 05/20/2009  . HEADACHE 05/20/2009  . CARDIAC MURMUR 05/20/2009  . OSTEOPENIA 02/15/2008  . POSTMENOPAUSAL STATUS 12/21/2007  . Hyperlipidemia LDL goal <70 09/21/2007  . Essential hypertension 09/13/2007  . Type II diabetes mellitus with nephropathy [facility])     Past Surgical History:  Procedure Laterality Date  . AV FISTULA PLACEMENT Left 05/29/2014   Procedure: INSERTION OF ARTERIOVENOUS (AV) GORE-TEX GRAFT ARM-LEFT;  Surgeon: Angelia Mould, MD;  Location: [facility];  Service: Vascular;  Laterality: Left;  . CHOLECYSTECTOMY  02/12/07  . INSERTION OF DIALYSIS CATHETER Right 05/27/2014   Procedure: INSERTION OF DIALYSIS CATHETER;  Surgeon: Angelia Mould, MD;  Location: [facility];  Service: Vascular;  Laterality: Right;  . REFRACTIVE SURGERY  07/29/09   Dr. Bing Plume  . TONSILLECTOMY      OB History    No data available       Home Medications    Prior to Admission medications   Medication Sig Start Date End Date Taking? Authorizing Provider  acetaminophen (TYLENOL) 325 MG tablet Take 325 mg by mouth every 6 (six) hours as needed for moderate pain.   Yes Historical Provider, MD  amLODipine (NORVASC) 10 MG tablet TAKE 1 TABLET BY MOUTH EVERY DAY Patient taking differently: TAKE 10 mg BY MOUTH EVERY DAY 12/30/15  Yes Alferd Apa Lowne Chase, DO  carvedilol (  COREG) 25 MG tablet TAKE 1 TABLET (25 MG TOTAL) BY MOUTH 2 (TWO) TIMES DAILY WITH A MEAL. 09/12/16  Yes Yvonne R Lowne Chase, DO  furosemide (LASIX) 40 MG tablet TAKE 1 TABLET BY MOUTH EVERY DAY Patient taking differently: TAKE 40mg   BY MOUTH EVERY DAY 11/10/16  Yes Yvonne R Lowne Chase, DO  isosorbide mononitrate (IMDUR) 30 MG 24 hr tablet TAKE 1 TABLET BY MOUTH TWICE A DAY Patient taking differently: TAKE 30mg   BY MOUTH TWICE A DAY 11/01/16  Yes Yvonne R Lowne Chase, DO  lisinopril (PRINIVIL,ZESTRIL) 40 MG tablet  Take 40 mg by mouth at bedtime. 09/20/16  Yes Historical Provider, MD  losartan (COZAAR) 100 MG tablet Take 100 mg by mouth at bedtime. 09/14/16  Yes Historical Provider, MD  Multiple Vitamin (MULTIVITAMIN WITH MINERALS) TABS tablet Take 2 tablets by mouth daily.   Yes Historical Provider, MD  calcium acetate (PHOSLO) 667 MG capsule Take 1 capsule (667 mg total) by mouth 2 (two) times daily. Patient not taking: Reported on 07/20/2016 07/22/15   Rosalita Chessman Chase, DO  lisinopril (PRINIVIL,ZESTRIL) 10 MG tablet Take 1 tablet (10 mg total) by mouth every evening. OFFICE VISIT DUE NOW Patient not taking: Reported on 11/25/2016 06/21/16   Ann Held, DO    Family History Family History  Problem Relation Age of Onset  . Coronary artery disease Other   . Diabetes Other   . Hypertension Other   . Arthritis Other     Social History Social History  Substance Use Topics  . Smoking status: Never Smoker  . Smokeless tobacco: Never Used     Comment: Married x's 69yrs, 6 kids-scattered [employer]. Retired Consulting civil engineer  . Alcohol use No     Allergies   Hydralazine and Robaxin [methocarbamol]   Review of Systems Review of Systems  All other systems reviewed and are negative.    Physical Exam Updated Vital Signs BP 172/62   Pulse 80   Temp 98.1 F (36.7 C) (Oral)   Resp 22   Ht 5\' 5"  (1.651 m)   Wt 145 lb (65.8 kg)   SpO2 98%   BMI 24.13 kg/m   Physical Exam  Constitutional: She is oriented to person, place, and time. She appears well-developed and well-nourished.  HENT:  Head: Normocephalic and atraumatic.  Cardiovascular: Normal rate and regular rhythm.   No murmur heard. Pulmonary/Chest: She is in respiratory distress.  Tachypnea with diffuse rales  Abdominal: Soft. There is no tenderness. There is no rebound and no guarding.  Musculoskeletal: She exhibits no edema or tenderness.  Fistula left upper extremity with palpable thrill  Neurological: She is alert and  oriented to person, place, and time.  Skin: Skin is warm and dry.  Psychiatric: She has a normal mood and affect. Her behavior is normal.  Nursing note and vitals reviewed.    ED Treatments / Results  Labs (all labs ordered are listed, but only abnormal results are displayed) Labs Reviewed  COMPREHENSIVE METABOLIC PANEL - Abnormal; Notable for the following:       Result Value   Chloride 99 (*)    BUN 53 (*)    Creatinine, Ser 6.00 (*)    Calcium 8.1 (*)    AST 53 (*)    Alkaline Phosphatase 153 (*)    GFR calc non Af Amer 6 (*)    GFR calc Af Amer 7 (*)    All other components within normal limits  CBC WITH DIFFERENTIAL/PLATELET -  Abnormal; Notable for the following:    RBC 3.64 (*)    Hemoglobin 10.2 (*)    HCT 33.2 (*)    RDW 19.3 (*)    Platelets 146 (*)    All other components within normal limits  URINALYSIS, ROUTINE W REFLEX MICROSCOPIC - Abnormal; Notable for the following:    Protein, ur >=300 (*)    Bacteria, UA RARE (*)    Squamous Epithelial / LPF 0-5 (*)    All other components within normal limits  I-STAT CHEM 8, ED - Abnormal; Notable for the following:    Chloride 100 (*)    BUN 47 (*)    Creatinine, Ser 6.40 (*)    Glucose, Bld 100 (*)    Calcium, Ion 0.96 (*)    Hemoglobin 11.9 (*)    HCT 35.0 (*)    All other components within normal limits  I-STAT TROPOININ, ED    EKG  EKG Interpretation  Date/Time:  Friday November 25 2016 16:01:49 EST Ventricular Rate:  91 PR Interval:    QRS Duration: 92 QT Interval:  388 QTC Calculation: 478 R Axis:   84 Text Interpretation:  Sinus rhythm Borderline right axis deviation Consider left ventricular hypertrophy Confirmed by Hazle Coca [phone]) on 11/25/2016 4:04:06 PM       Radiology Dg Chest Port 1 View  Result Date: 11/25/2016 CLINICAL DATA:  Severe shortness of breath and diarrhea. EXAM: PORTABLE CHEST 1 VIEW COMPARISON:  07/20/2016; 05/27/2014. FINDINGS: Grossly unchanged borderline enlarged cardiac  silhouette. Atherosclerotic plaque when the thoracic aorta. Pulmonary vasculature appears indistinct with cephalization of flow. Perihilar heterogeneous opacities, right greater than left. Suspected trace pleural effusions. No pneumothorax. No acute osseus abnormalities. A vascular stent overlies the medial aspect of the left upper arm. IMPRESSION: 1. Findings most worrisome for pulmonary edema and trace bilateral effusions, though note, developing infection not excluded. Continued attention on follow-up is recommended. 2.  Aortic Atherosclerosis (ICD10-170.0) Electronically Signed   By: Sandi Mariscal M.D.   On: 11/25/2016 16:12    Procedures Procedures (including critical care time) CRITICAL CARE Performed by: Quintella Reichert   Total critical care time: 35 minutes  Critical care time was exclusive of separately billable procedures and treating other patients.  Critical care was necessary to treat or prevent imminent or life-threatening deterioration.  Critical care was time spent personally by me on the following activities: development of treatment plan with patient and/or surrogate as well as nursing, discussions with consultants, evaluation of patient's response to treatment, examination of patient, obtaining history from patient or surrogate, ordering and performing treatments and interventions, ordering and review of laboratory studies, ordering and review of radiographic studies, pulse oximetry and re-evaluation of patient's condition.  Medications Ordered in ED Medications  nitroGLYCERIN 50 mg in dextrose 5 % 250 mL (0.2 mg/mL) infusion (50 mcg/min Intravenous Rate/Dose Change 11/25/16 1756)  furosemide (LASIX) injection 80 mg (not administered)  furosemide (LASIX) injection 40 mg (40 mg Intravenous Given 11/25/16 1743)     Initial Impression / Assessment and Plan / ED Course  I have reviewed the triage vital signs and the nursing notes.  Pertinent labs & imaging results that were  available during my care of the patient were reviewed by me and considered in my medical decision making (see chart for details).     Patient with history of end-stage renal disease on dialysis here with sudden onset shortness of breath. She was in significant distress on ED arrival with hypoxia, tachypnea, rales. She  was started on BiPAP and nitro drip. After initiation of the BiPAP and nitroglycerin her work of breathing significantly improved. She is currently breathing comfortably on the BiPAP and satting 100% on FiO2 of 40%. Discussed the case with Nephrology at [facility]. Hospitalist consulted for admission with transfer to [facility] for dialysis. Patient updated of findings of studies and recommendation for admission and she is in agreement with plan. Current clinical picture is not consistent with pneumonia.  Final Clinical Impressions(s) / ED Diagnoses   Final diagnoses:  Acute pulmonary edema [facility])    New Prescriptions New Prescriptions   No medications on file     Quintella Reichert, MD 11/25/16 1825

## 2016-11-25 NOTE — ED Notes (Signed)
EKG obtained at 1550-given to [facility]

## 2016-11-25 NOTE — ED Notes (Addendum)
Pt removed from [place], switched to 6L [place] for titration. Pt reports she feels less SOB now.

## 2016-11-25 NOTE — Consult Note (Signed)
[facility] Consultation Note Requesting Physician:  Dirk Dress EDP Primary Nephrologist: Joelyn Oms ([facility]) Reason for Consult:  Provision of dialysis and ESRD related services  HPI: The patient is a 76 y.o. year-old woman with  ESRD 2/2 DM/ HTN (MWF [place]), poorly controlled HTN, h/o NSTEMI 2015. Presented to [facility] ED today w/severe SOB, "feeling poorly, diarrhea, nausea, some vomiting, non-productive cough. Did not go to HD today because felt bad. Last treatment was 11/23/16. EDW is 63 kg but has been leaving several kg over (left at 65.5 on Wed) with significant HTN.  BP was recorded as high as 811 systolic, O2 sats as low as 80%. CXR w/pleural effusions, edema. She was treated with BIPAP, IV nitroglycerin with some improvement in her symptoms. She is transferred to [facility] for further management and for HD. Says she is compliant with her BP meds (and that her husband assures that she takes)   Past Medical History:  Diagnosis Date  . DEPRESSION   . DIABETES MELLITUS, TYPE I   . GERD   . GLAUCOMA   . Headache(784.0)   . Hepatitis B carrier ([place])    05/2009: neg Hep C; Hep B: core pos, Surf neg; fatty liver US 8/10 - 7/13  . HYPERLIPIDEMIA   . HYPERTENSION   . Kidney failure    dialysis 3 times per week  . OSTEOPENIA   . POSTMENOPAUSAL STATUS   . Unspecified vitamin D deficiency     Past Surgical History:  Procedure Laterality Date  . AV FISTULA PLACEMENT Left 05/29/2014   Procedure: INSERTION OF ARTERIOVENOUS (AV) GORE-TEX GRAFT ARM-LEFT;  Surgeon: Angelia Mould, MD;  Location: [facility];  Service: Vascular;  Laterality: Left;  . CHOLECYSTECTOMY  02/12/07  . INSERTION OF DIALYSIS CATHETER Right 05/27/2014   Procedure: INSERTION OF DIALYSIS CATHETER;  Surgeon: Angelia Mould, MD;  Location: [facility];  Service: Vascular;  Laterality: Right;  . REFRACTIVE SURGERY  07/29/09   Dr. Bing Plume  . TONSILLECTOMY      Family History  Problem Relation Age of Onset  . Coronary artery disease  Other   . Diabetes Other   . Hypertension Other   . Arthritis Other    Social History:  reports that she has never smoked. She has never used smokeless tobacco. She reports that she does not drink alcohol or use drugs.  Allergies  Allergen Reactions  . Hydralazine Itching  . Robaxin [Methocarbamol]     Dizziness     Prior to Admission medications   Medication Sig Start Date End Date Taking? Authorizing Provider  acetaminophen (TYLENOL) 325 MG tablet Take 325 mg by mouth every 6 (six) hours as needed for moderate pain.   Yes Historical Provider, MD  amLODipine (NORVASC) 10 MG tablet TAKE 1 TABLET BY MOUTH EVERY DAY Patient taking differently: TAKE 10 mg BY MOUTH EVERY DAY 12/30/15  Yes Yvonne R Lowne Chase, DO  carvedilol (COREG) 25 MG tablet TAKE 1 TABLET (25 MG TOTAL) BY MOUTH 2 (TWO) TIMES DAILY WITH A MEAL. 09/12/16  Yes Yvonne R Lowne Chase, DO  furosemide (LASIX) 40 MG tablet TAKE 1 TABLET BY MOUTH EVERY DAY Patient taking differently: TAKE 40mg   BY MOUTH EVERY DAY 11/10/16  Yes Yvonne R Lowne Chase, DO  isosorbide mononitrate (IMDUR) 30 MG 24 hr tablet TAKE 1 TABLET BY MOUTH TWICE A DAY Patient taking differently: TAKE 30mg   BY MOUTH TWICE A DAY 11/01/16  Yes Yvonne R Lowne Chase, DO  lisinopril (PRINIVIL,ZESTRIL) 40 MG  tablet Take 40 mg by mouth at bedtime. 09/20/16  Yes Historical Provider, MD  losartan (COZAAR) 100 MG tablet Take 100 mg by mouth at bedtime. 09/14/16  Yes Historical Provider, MD  Multiple Vitamin (MULTIVITAMIN WITH MINERALS) TABS tablet Take 2 tablets by mouth daily.   Yes Historical Provider, MD  calcium acetate (PHOSLO) 667 MG capsule Take 1 capsule (667 mg total) by mouth 2 (two) times daily. Patient not taking: Reported on 07/20/2016 07/22/15   Rosalita Chessman Chase, DO  lisinopril (PRINIVIL,ZESTRIL) 10 MG tablet Take 1 tablet (10 mg total) by mouth every evening. OFFICE VISIT DUE NOW Patient not taking: Reported on 11/25/2016 06/21/16   Ann Held, DO    Inpatient medications: . [START ON 11/26/2016] amLODipine  10 mg Oral Daily  . calcitRIOL  0.75 mcg Oral Q M,W,F  . [START ON 11/26/2016] carvedilol  25 mg Oral BID WC  . chlorhexidine  15 mL Mouth Rinse BID  . [START ON 11/26/2016] furosemide  40 mg Oral Daily  . heparin  5,000 Units Subcutaneous Q8H  . [START ON 11/26/2016] insulin aspart  0-9 Units Subcutaneous TID WC  . isosorbide mononitrate  30 mg Oral BID  . lisinopril  40 mg Oral QHS  . [START ON 11/26/2016] mouth rinse  15 mL Mouth Rinse q12n4p  . sodium chloride flush  3 mL Intravenous Q12H  . sodium chloride flush  3 mL Intravenous Q12H    Review of Systems As in HPI Physical Exam:  Blood pressure (!) 176/52, pulse 82, temperature 97.8 F (36.6 C), resp. rate 19, height 5\' 5"  (1.651 m), weight 68.1 kg (150 lb 3.2 oz), SpO2 100 %. VS as noted Pt on BIPAP and comfortable Daughters in the room with her Decreased BS bilaterally w/crackles S1S2 + S4 Abd soft no tenderness LE's no sig edema L upper arm AVG + bruit  Labs:  Recent Labs Lab 11/25/16 1559 11/25/16 1609  NA 137 137  K 4.2 4.2  CL 99* 100*  CO2 26  --   GLUCOSE 99 100*  BUN 53* 47*  CREATININE 6.00* 6.40*  CALCIUM 8.1*  --     Recent Labs Lab 11/25/16 1559  AST 53*  ALT 31  ALKPHOS 153*  BILITOT 0.5  PROT 7.6  ALBUMIN 3.6    Recent Labs Lab 11/25/16 1559 11/25/16 1609  WBC 5.3  --   NEUTROABS 4.0  --   HGB 10.2* 11.9*  HCT 33.2* 35.0*  MCV 91.2  --   PLT 146*  --     Xrays/Other Studies: Dg Chest Port 1 View  Result Date: 11/25/2016 CLINICAL DATA:  Severe shortness of breath and diarrhea. EXAM: PORTABLE CHEST 1 VIEW COMPARISON:  07/20/2016; 05/27/2014. FINDINGS: Grossly unchanged borderline enlarged cardiac silhouette. Atherosclerotic plaque when the thoracic aorta. Pulmonary vasculature appears indistinct with cephalization of flow. Perihilar heterogeneous opacities, right greater than left. Suspected trace pleural effusions. No  pneumothorax. No acute osseus abnormalities. A vascular stent overlies the medial aspect of the left upper arm. IMPRESSION: 1. Findings most worrisome for pulmonary edema and trace bilateral effusions, though note, developing infection not excluded. Continued attention on follow-up is recommended. 2.  Aortic Atherosclerosis (ICD10-170.0) Electronically Signed   By: Sandi Mariscal M.D.   On: 11/25/2016 16:12   Dialysis prescription: [facility] [place] 3.5 hours 2K2Ca EDW 63 kg (does not get to) Mircera 225 Q2 (last give 1/24) Calcitriol 0.75 TIW Heparin 5000 units  Assessment/Recommendations  1. Acute resp failure - Pulm  edema w/hypoxemia - seems to be not as much vol overload as high afterload from uncontrolled HTN. Needs HD/BP control. May benefit from longer treatment time than 3.5 hours to get closer to EDW (though volume may not be the biggest issue here). Wean nitro. ECHO. Wean BIPAP after HD. 2. ESRD - MWF HD.  3. Chronic dCHF  4. Hypertensive urgency/emergency - contributing to SOB/flash pulm edema. Needs better BP control. Says compliant w/BP meds and if so need to uptick her meds. 5. DM2 - diet controlled   [name],  MD [facility] [phone] pager 11/25/2016, 10:01 PM

## 2016-11-25 NOTE — H&P (Addendum)
History and Physical    [name] [name] ZJQ:[id number] DOB: 05-31-1941 DOA: 11/25/2016  PCP: Ann Held, DO Patient coming from: Home  Chief Complaint: Chest congestion  HPI: [name] [name] is a 76 y.o. female with medical history significant of ESRD on dialysis, essential hypertension, GERD, Diabetes mellitus, type 2, NSTEMI, hyperlipidemia. Patient reports symptoms of chest congestion that started this morning. She had associated loose stools, one episode of emesis and overall weakness. She took Tylenol which did not help with her symptoms. She did not take any of her prescribed home medications to help with symptoms. Symptoms continued to worsen and the patient's daughter took her to the emergency department for evaluation.  Patient has dialysis Monday, Wednesday and Friday and was supposed to have a session today, but felt too ill.  ED Course: Vitals: Afebrile, normal pulse, respirations initially in 30s down to high 10s and low 20s. Blood pressure initially in 240 systolic down to [id number] systolic on nitro drip. 80% SpO2 on room air, up to 100% on BiPAP Labs: Potassium of 4.2, creatinine of 6.00 up to 6.40, BUN of 53, WBC of 5.3, hemoglobin of 11.9 Imaging: CXR significant for pulmonary edema with trace bilateral effusions; ?developing infection. Medications/Course: Nitro drip and lasix 40mg  given.   Review of Systems: Review of Systems  Constitutional: Negative for fever and malaise/fatigue.  Respiratory: Positive for cough and shortness of breath. Negative for sputum production and wheezing.   Cardiovascular: Negative for chest pain and palpitations.  Gastrointestinal: Positive for diarrhea (loose stools), nausea and vomiting. Negative for abdominal pain, blood in stool, constipation and melena.  Genitourinary: Negative for dysuria.    Past Medical History:  Diagnosis Date  . DEPRESSION   . DIABETES MELLITUS, TYPE I   . GERD   . GLAUCOMA   . Headache(784.0)   .  Hepatitis B carrier ([place])    05/2009: neg Hep C; Hep B: core pos, Surf neg; fatty liver US 8/10 - 7/13  . HYPERLIPIDEMIA   . HYPERTENSION   . Kidney failure    dialysis 3 times per week  . OSTEOPENIA   . POSTMENOPAUSAL STATUS   . Unspecified vitamin D deficiency     Past Surgical History:  Procedure Laterality Date  . AV FISTULA PLACEMENT Left 05/29/2014   Procedure: INSERTION OF ARTERIOVENOUS (AV) GORE-TEX GRAFT ARM-LEFT;  Surgeon: Angelia Mould, MD;  Location: [place];  Service: Vascular;  Laterality: Left;  . CHOLECYSTECTOMY  02/12/07  . INSERTION OF DIALYSIS CATHETER Right 05/27/2014   Procedure: INSERTION OF DIALYSIS CATHETER;  Surgeon: Angelia Mould, MD;  Location: [place];  Service: Vascular;  Laterality: Right;  . REFRACTIVE SURGERY  07/29/09   Dr. Bing Plume  . TONSILLECTOMY       reports that she has never smoked. She has never used smokeless tobacco. She reports that she does not drink alcohol or use drugs.  Allergies  Allergen Reactions  . Hydralazine Itching  . Robaxin [Methocarbamol]     Dizziness     Family History  Problem Relation Age of Onset  . Coronary artery disease Other   . Diabetes Other   . Hypertension Other   . Arthritis Other     Prior to Admission medications   Medication Sig Start Date End Date Taking? Authorizing Provider  acetaminophen (TYLENOL) 325 MG tablet Take 325 mg by mouth every 6 (six) hours as needed for moderate pain.    Historical Provider, MD  amLODipine (NORVASC) 10 MG tablet TAKE  1 TABLET BY MOUTH EVERY DAY 12/30/15   Rosalita Chessman Chase, DO  calcium acetate (PHOSLO) 667 MG capsule Take 1 capsule (667 mg total) by mouth 2 (two) times daily. Patient not taking: Reported on 07/20/2016 07/22/15   Alferd Apa Lowne Chase, DO  carvedilol (COREG) 25 MG tablet TAKE 1 TABLET (25 MG TOTAL) BY MOUTH 2 (TWO) TIMES DAILY WITH A MEAL. 09/12/16   Rosalita Chessman Chase, DO  furosemide (LASIX) 40 MG tablet TAKE 1 TABLET BY MOUTH EVERY DAY  11/10/16   Rosalita Chessman Chase, DO  isosorbide mononitrate (IMDUR) 30 MG 24 hr tablet TAKE 1 TABLET BY MOUTH TWICE A DAY 11/01/16   Alferd Apa Lowne Chase, DO  lisinopril (PRINIVIL,ZESTRIL) 10 MG tablet Take 1 tablet (10 mg total) by mouth every evening. OFFICE VISIT DUE NOW Patient not taking: Reported on 11/25/2016 06/21/16   Rosalita Chessman Chase, DO  lisinopril (PRINIVIL,ZESTRIL) 40 MG tablet Take 40 mg by mouth at bedtime. 09/20/16   Historical Provider, MD  losartan (COZAAR) 100 MG tablet Take 100 mg by mouth at bedtime. 09/14/16   Historical Provider, MD  Multiple Vitamin (MULTIVITAMIN WITH MINERALS) TABS tablet Take 2 tablets by mouth daily.    Historical Provider, MD    Physical Exam: Vitals:   11/25/16 1709 11/25/16 1724 11/25/16 1730 11/25/16 1745  BP: 185/62 187/61 187/69 183/67  Pulse: 81 88 85 80  Resp: 23 21 23 23   Temp:      TempSrc:      SpO2: 100% 100% 100% 99%  Weight:      Height:         Constitutional: NAD, calm, comfortable Eyes: PERRL, lids and conjunctivae normal ENMT: Mucous membranes are moist. Posterior pharynx clear of any exudate or lesions.  Neck: normal, supple, no masses, no thyromegaly Respiratory: Decreased breath sounds bilaterally with crackles. Normal respiratory effort on Bipap. No accessory muscle use.  Cardiovascular: Regular rate and rhythm, no murmurs / rubs / gallops. No extremity edema. Abdomen: no tenderness, no masses palpated. Slightly distended. Bowel sounds positive.  Musculoskeletal: no clubbing / cyanosis. No joint deformity upper and lower extremities. Good ROM, no contractures. Normal muscle tone.  Skin: no rashes, lesions, ulcers. No induration Neurologic: CN 2-12 grossly intact. Sensation intact, DTR normal. Strength 5/5 in all 4.  Psychiatric: Normal judgment and insight. Alert and oriented x 3. Normal mood.    Labs on Admission: I have personally reviewed following labs and imaging studies  CBC:  Recent Labs Lab  11/25/16 1559 11/25/16 1609  WBC 5.3  --   NEUTROABS 4.0  --   HGB 10.2* 11.9*  HCT 33.2* 35.0*  MCV 91.2  --   PLT 146*  --    Basic Metabolic Panel:  Recent Labs Lab 11/25/16 1559 11/25/16 1609  NA 137 137  K 4.2 4.2  CL 99* 100*  CO2 26  --   GLUCOSE 99 100*  BUN 53* 47*  CREATININE 6.00* 6.40*  CALCIUM 8.1*  --    GFR: Estimated Creatinine Clearance: 6.8 mL/min (by C-G formula based on SCr of 6.4 mg/dL (H)). Liver Function Tests:  Recent Labs Lab 11/25/16 1559  AST 53*  ALT 31  ALKPHOS 153*  BILITOT 0.5  PROT 7.6  ALBUMIN 3.6   No results for input(s): LIPASE, AMYLASE in the last 168 hours. No results for input(s): AMMONIA in the last 168 hours. Coagulation Profile: No results for input(s): INR, PROTIME in the last 168 hours. Cardiac  Enzymes: No results for input(s): CKTOTAL, CKMB, CKMBINDEX, TROPONINI in the last 168 hours. BNP (last 3 results) No results for input(s): PROBNP in the last 8760 hours. HbA1C: No results for input(s): HGBA1C in the last 72 hours. CBG: No results for input(s): GLUCAP in the last 168 hours. Lipid Profile: No results for input(s): CHOL, HDL, LDLCALC, TRIG, CHOLHDL, LDLDIRECT in the last 72 hours. Thyroid Function Tests: No results for input(s): TSH, T4TOTAL, FREET4, T3FREE, THYROIDAB in the last 72 hours. Anemia Panel: No results for input(s): VITAMINB12, FOLATE, FERRITIN, TIBC, IRON, RETICCTPCT in the last 72 hours. Urine analysis:    Component Value Date/Time   COLORURINE YELLOW 11/25/2016 1630   APPEARANCEUR CLEAR 11/25/2016 1630   LABSPEC 1.010 11/25/2016 1630   PHURINE 8.0 11/25/2016 1630   GLUCOSEU NEGATIVE 11/25/2016 1630   HGBUR NEGATIVE 11/25/2016 1630   BILIRUBINUR NEGATIVE 11/25/2016 1630   KETONESUR NEGATIVE 11/25/2016 1630   PROTEINUR >=300 (A) 11/25/2016 1630   UROBILINOGEN 2.0 (H) 04/01/2008 1751   NITRITE NEGATIVE 11/25/2016 1630   LEUKOCYTESUR NEGATIVE 11/25/2016 1630   Sepsis Labs:  !!!!!!!!!!!!!!!!!!!!!!!!!!!!!!!!!!!!!!!!!!!! @LABRCNTIP (procalcitonin:4,lacticidven:4) )No results found for this or any previous visit (from the past 240 hour(s)).   Radiological Exams on Admission: Dg Chest Port 1 View  Result Date: 11/25/2016 CLINICAL DATA:  Severe shortness of breath and diarrhea. EXAM: PORTABLE CHEST 1 VIEW COMPARISON:  07/20/2016; 05/27/2014. FINDINGS: Grossly unchanged borderline enlarged cardiac silhouette. Atherosclerotic plaque when the thoracic aorta. Pulmonary vasculature appears indistinct with cephalization of flow. Perihilar heterogeneous opacities, right greater than left. Suspected trace pleural effusions. No pneumothorax. No acute osseus abnormalities. A vascular stent overlies the medial aspect of the left upper arm. IMPRESSION: 1. Findings most worrisome for pulmonary edema and trace bilateral effusions, though note, developing infection not excluded. Continued attention on follow-up is recommended. 2.  Aortic Atherosclerosis (ICD10-170.0) Electronically Signed   By: Sandi Mariscal M.D.   On: 11/25/2016 16:12    EKG: Independently reviewed. Sinus rhythm  Assessment/Plan Principal Problem:   Acute respiratory failure with hypoxia (HCC) Active Problems:   Type II diabetes mellitus with nephropathy (HCC)   Essential hypertension   GERD   Pulmonary edema   Acute respiratory failure with hypoxia Secondary to pulmonary edema in setting of volume overload from missed dialysis. Possible acute heart decompensation could have contributed. Improved with lasix and nitro drip -Bipap, wean to room air as able -lasix 80mg  IV x1 -dialysis per nephrology -echocardiogram  Pulmonary edema Possibly secondary to fluid overload from missed dialysis (only a few hours though) vs acute heart failure. -management as above  Chronic diastolic heart failure Last EF of 60-65% with grade 1 diastolic dysfunction on echo from 05/18/2014. Except for pulmonary edema, not fluid  up. -daily weights -strict in/out  Hypertensive emergency Essential hypertension Patient missed blood pressure medications earlier today. Hypertension likely contributing to current presentation. Improved with nitroglycerin drip. -restart home antihypertensives: lisinopril, amlodipine, Coreg -discontinue nitro drip once home medications administered  ESRD On dialysis: Monday, Wednesday, Friday. Still makes urine. -dialysis per nephrology  Diabetes mellitus, type 2 Last hemoglobin A1C of 5.2 in 2016. Diet controlled -SSI -hemoglobin A1C  Nausea with vomiting Diarrhea Symptoms improved somewhat but with associated malaise. No fevers. ?sick contacts in dialysis -flu PCR   DVT prophylaxis: Subcutaneous heparin Code Status: Full Code Family Communication: Daughter at bedside Disposition Plan: Anticipate discharge home in 2-3 days Consults called: Nephrology (Dr. Lorrene Reid) by EDP Admission status: Inpatient, SDU   Cordelia Poche, MD [facility] Pager [phone])  [phone]  If 7PM-7AM, please contact night-coverage [email or web address] Password [facility]  11/25/2016, 6:08 PM

## 2016-11-25 NOTE — ED Notes (Signed)
Pt reports she is feeling SOB again. bipap reapplied.

## 2016-11-25 NOTE — ED Triage Notes (Signed)
Per family, pt started to feel "sick" last night, reports severe SOB today with diarrhea.  Pt denies CP.  Denies hx of any lung problems.  Pt is a dialysis pt, was not able to go today d/t not feeling well.  Pt is A&Ox 4.

## 2016-11-26 DIAGNOSIS — I5032 Chronic diastolic (congestive) heart failure: Secondary | ICD-10-CM

## 2016-11-26 DIAGNOSIS — J9601 Acute respiratory failure with hypoxia: Principal | ICD-10-CM

## 2016-11-26 DIAGNOSIS — E1121 Type 2 diabetes mellitus with diabetic nephropathy: Secondary | ICD-10-CM

## 2016-11-26 DIAGNOSIS — I161 Hypertensive emergency: Secondary | ICD-10-CM

## 2016-11-26 LAB — BASIC METABOLIC PANEL
Anion gap: 13 (ref 5–15)
BUN: 53 mg/dL — ABNORMAL HIGH (ref 6–20)
CALCIUM: 7.9 mg/dL — AB (ref 8.9–10.3)
CO2: 25 mmol/L (ref 22–32)
CREATININE: 6.62 mg/dL — AB (ref 0.44–1.00)
Chloride: 99 mmol/L — ABNORMAL LOW (ref 101–111)
GFR calc non Af Amer: 5 mL/min — ABNORMAL LOW (ref >60)
GFR, EST AFRICAN AMERICAN: 6 mL/min — AB (ref >60)
Glucose, Bld: 82 mg/dL (ref 65–99)
Potassium: 4.3 mmol/L (ref 3.5–5.1)
Sodium: 137 mmol/L (ref 135–145)

## 2016-11-26 LAB — GLUCOSE, CAPILLARY
GLUCOSE-CAPILLARY: 72 mg/dL (ref 65–99)
GLUCOSE-CAPILLARY: 81 mg/dL (ref 65–99)
GLUCOSE-CAPILLARY: 96 mg/dL (ref 65–99)
Glucose-Capillary: 129 mg/dL — ABNORMAL HIGH (ref 65–99)

## 2016-11-26 LAB — INFLUENZA PANEL BY PCR (TYPE A & B)
INFLBPCR: NEGATIVE
Influenza A By PCR: NEGATIVE

## 2016-11-26 NOTE — Procedures (Signed)
I have personally attended this patient's dialysis session.  Pt still on BIPAP but comfortable Pre HD bed weight 67 kg UF goal 4 kg but cramping/UF off at present BP 631'S systolic L AVG 970 no cannulation issues 2K bath  I have also increased HD time from 3.5->4 hours to allow for better UF w/hopefully less cramping  [name], MD [place] Pager 11/26/2016, 7:42 AM

## 2016-11-26 NOTE — Progress Notes (Signed)
Provider on call made aware of negative influenza PCR. Orders given to d/c droplet precautions at this time.

## 2016-11-26 NOTE — Progress Notes (Addendum)
PROGRESS NOTE        PATIENT DETAILS Name: [name] [name] Age: 76 y.o. Sex: female Date of Birth: [birth year] Admit Date: 11/25/2016 Admitting Physician Mariel Aloe, MD OYD:XAJOIN R Carollee Herter, DO  Brief Narrative: Patient is a 76 y.o. female with history of ESRD, type 2 diabetes, hypertension presented to the hospital with cough, shortness of breath-found to have pulmonary edema in a setting of missed hemodialysis, and malignant hypertension. Started on BiPAP, nitroglycerin infusion and admitted to the hospitalist service. See below for further details  Subjective: Appears comfortable-seen earlier this morning. BiPAP in place. Awake and alert  Assessment/Plan: Principal Problem:  Acute respiratory failure with hypoxia due to pulmonary edema in the setting of missed hemodialysis and malignant hypertension: Hemodialysis currently in progress-wean off BiPAP as tolerated. Await echocardiogram.  Malignant hypertension: BP Should improve with hemodialysis-titrate off nitroglycerin infusion. Follow BP trend on oral medications and adjust accordingly.  ESRD: on hemodialysis-MWF. Nephrology following, patient currently getting hemodialysis in room.  Acute on Chronic diastolic heart failure: See above-volume removal during hemodialysis. Suspect pulmonary edema precipitated from uncontrolled hypertension and missed hemodialysis.  Type 2 diabetes: CBGs stable with SSI-diet controlled at home  Anemia: Hemoglobin stable-likely related to ESRD-defer to nephrology  DVT Prophylaxis: Prophylactic Heparin   Code Status: Full code   Family Communication: Multiple family members at bedside  Disposition Plan: Remain inpatient-home in the next day or so, may need home health services  Antimicrobial agents: Anti-infectives    None      Procedures: None  CONSULTS:  nephrology  Time spent: 25- minutes-Greater than 50% of this time was spent in counseling,  explanation of diagnosis, planning of further management, and coordination of care.  MEDICATIONS: Scheduled Meds: . amLODipine  10 mg Oral Daily  . calcitRIOL  0.75 mcg Oral Q M,W,F  . carvedilol  25 mg Oral BID WC  . chlorhexidine  15 mL Mouth Rinse BID  . heparin  5,000 Units Subcutaneous Q8H  . insulin aspart  0-9 Units Subcutaneous TID WC  . isosorbide mononitrate  30 mg Oral BID  . lisinopril  40 mg Oral QHS  . mouth rinse  15 mL Mouth Rinse q12n4p  . sodium chloride flush  3 mL Intravenous Q12H  . sodium chloride flush  3 mL Intravenous Q12H   Continuous Infusions: . nitroGLYCERIN 60 mcg/min (11/26/16 0448)   PRN Meds:.sodium chloride, acetaminophen **OR** acetaminophen, lip balm, sodium chloride flush   PHYSICAL EXAM: Vital signs: Vitals:   11/26/16 0830 11/26/16 0845 11/26/16 0900 11/26/16 0915  BP: (!) 175/67 (!) 146/64 (!) 122/51   Pulse: 78 78 79 (P) 74  Resp: (!) 21 19 16    Temp:      TempSrc:      SpO2: 100% 94% 100%   Weight:      Height:       Filed Weights   11/25/16 2023 11/26/16 0435 11/26/16 0500  Weight: 68.1 kg (150 lb 3.2 oz) 68.5 kg (151 lb) 67 kg (147 lb 11.3 oz)   Body mass index is 24.58 kg/m.   General appearance :Awake, alert,on BiPAP Eyes:, pupils equally reactive to light and accomodation,no scleral icterus. HEENT: Atraumatic and Normocephalic Neck: supple, no JVD. No cervical lymphadenopathy. No thyromegaly Resp:Good air entry bilaterally, few bibasilares  CVS: S1 S2 regular  GI: Bowel sounds present, Non tender and  not distended with no gaurding, rigidity or rebound.No organomegaly Extremities: B/L Lower Ext shows no edema, both legs are warm to touch Neurology:  speech clear,Non focal, sensation is grossly intact. Musculoskeletal:No digital cyanosis Skin:No Rash, warm and dry Wounds:N/A  I have personally reviewed following labs and imaging studies  LABORATORY DATA: CBC:  Recent Labs Lab 11/25/16 1559 11/25/16 1609  WBC  5.3  --   NEUTROABS 4.0  --   HGB 10.2* 11.9*  HCT 33.2* 35.0*  MCV 91.2  --   PLT 146*  --     Basic Metabolic Panel:  Recent Labs Lab 11/25/16 1559 11/25/16 1609 11/26/16 0401  NA 137 137 137  K 4.2 4.2 4.3  CL 99* 100* 99*  CO2 26  --  25  GLUCOSE 99 100* 82  BUN 53* 47* 53*  CREATININE 6.00* 6.40* 6.62*  CALCIUM 8.1*  --  7.9*    GFR: Estimated Creatinine Clearance: 6.6 mL/min (by C-G formula based on SCr of 6.62 mg/dL (H)).  Liver Function Tests:  Recent Labs Lab 11/25/16 1559  AST 53*  ALT 31  ALKPHOS 153*  BILITOT 0.5  PROT 7.6  ALBUMIN 3.6   No results for input(s): LIPASE, AMYLASE in the last 168 hours. No results for input(s): AMMONIA in the last 168 hours.  Coagulation Profile: No results for input(s): INR, PROTIME in the last 168 hours.  Cardiac Enzymes: No results for input(s): CKTOTAL, CKMB, CKMBINDEX, TROPONINI in the last 168 hours.  BNP (last 3 results) No results for input(s): PROBNP in the last 8760 hours.  HbA1C: No results for input(s): HGBA1C in the last 72 hours.  CBG:  Recent Labs Lab 11/25/16 2322 11/26/16 0741  GLUCAP 87 72    Lipid Profile: No results for input(s): CHOL, HDL, LDLCALC, TRIG, CHOLHDL, LDLDIRECT in the last 72 hours.  Thyroid Function Tests: No results for input(s): TSH, T4TOTAL, FREET4, T3FREE, THYROIDAB in the last 72 hours.  Anemia Panel: No results for input(s): VITAMINB12, FOLATE, FERRITIN, TIBC, IRON, RETICCTPCT in the last 72 hours.  Urine analysis:    Component Value Date/Time   COLORURINE YELLOW 11/25/2016 1630   APPEARANCEUR CLEAR 11/25/2016 1630   LABSPEC 1.010 11/25/2016 1630   PHURINE 8.0 11/25/2016 1630   GLUCOSEU NEGATIVE 11/25/2016 1630   HGBUR NEGATIVE 11/25/2016 1630   BILIRUBINUR NEGATIVE 11/25/2016 1630   KETONESUR NEGATIVE 11/25/2016 1630   PROTEINUR >=300 (A) 11/25/2016 1630   UROBILINOGEN 2.0 (H) 04/01/2008 1751   NITRITE NEGATIVE 11/25/2016 1630   LEUKOCYTESUR  NEGATIVE 11/25/2016 1630    Sepsis Labs: Lactic Acid, Venous    Component Value Date/Time   LATICACIDVEN 1.24 05/17/2014 1710    MICROBIOLOGY: Recent Results (from the past 240 hour(s))  MRSA PCR Screening     Status: None   Collection Time: 11/25/16  8:52 PM  Result Value Ref Range Status   MRSA by PCR NEGATIVE NEGATIVE Final    Comment:        The GeneXpert MRSA Assay (FDA approved for NASAL specimens only), is one component of a comprehensive MRSA colonization surveillance program. It is not intended to diagnose MRSA infection nor to guide or monitor treatment for MRSA infections.     RADIOLOGY STUDIES/RESULTS: Dg Chest Port 1 View  Result Date: 11/25/2016 CLINICAL DATA:  Severe shortness of breath and diarrhea. EXAM: PORTABLE CHEST 1 VIEW COMPARISON:  07/20/2016; 05/27/2014. FINDINGS: Grossly unchanged borderline enlarged cardiac silhouette. Atherosclerotic plaque when the thoracic aorta. Pulmonary vasculature appears indistinct with cephalization of  flow. Perihilar heterogeneous opacities, right greater than left. Suspected trace pleural effusions. No pneumothorax. No acute osseus abnormalities. A vascular stent overlies the medial aspect of the left upper arm. IMPRESSION: 1. Findings most worrisome for pulmonary edema and trace bilateral effusions, though note, developing infection not excluded. Continued attention on follow-up is recommended. 2.  Aortic Atherosclerosis (ICD10-170.0) Electronically Signed   By: Sandi Mariscal M.D.   On: 11/25/2016 16:12     LOS: 1 day   Oren Binet, MD  [facility] Pager:[phone]  If 7PM-7AM, please contact night-coverage [email or web address] Password [facility] 11/26/2016, 10:29 AM

## 2016-11-26 NOTE — Progress Notes (Signed)
[facility] Rounding Note Subjective/Interval History:  Still on BIPAP About 2 hours into HD currently 2 liters off so far, cramping Time extended to 4 hours  Objective Vital signs in last 24 hours: Vitals:   11/26/16 0645 11/26/16 0700 11/26/16 0715 11/26/16 0730  BP: (!) 175/64 (!) 181/62 (!) 154/91 (!) 158/63  Pulse: 76 72 73 73  Resp: (!) 26 (!) 22 19 18   Temp:      TempSrc:      SpO2: 100% 99% 99% 99%  Weight:      Height:       Weight change:   Intake/Output Summary (Last 24 hours) at 11/26/16 0742 Last data filed at 11/26/16 0657  Gross per 24 hour  Intake           180.18 ml  Output              700 ml  Net          -519.82 ml   Physical Exam:  Blood pressure (!) 158/63, pulse 73, temperature 98.2 F (36.8 C), temperature source Oral, resp. rate 18, height 5\' 5"  (1.651 m), weight 67 kg (147 lb 11.3 oz), SpO2 99 %.  VS as noted Pt still on BIPAP and comfortable Daughters still in the room with her Decreased BS bilaterally w/crackles unchanged so far S1S2 + S4 Abd soft no tenderness LE's no sig edema L upper arm AVG + bruit - cannulated  Labs:  Recent Labs Lab 11/25/16 1559 11/25/16 1609 11/26/16 0401  NA 137 137 137  K 4.2 4.2 4.3  CL 99* 100* 99*  CO2 26  --  25  GLUCOSE 99 100* 82  BUN 53* 47* 53*  CREATININE 6.00* 6.40* 6.62*  CALCIUM 8.1*  --  7.9*    Recent Labs Lab 11/25/16 1559  AST 53*  ALT 31  ALKPHOS 153*  BILITOT 0.5  PROT 7.6  ALBUMIN 3.6    Recent Labs Lab 11/25/16 1559 11/25/16 1609  WBC 5.3  --   NEUTROABS 4.0  --   HGB 10.2* 11.9*  HCT 33.2* 35.0*  MCV 91.2  --   PLT 146*  --     Recent Labs Lab 11/25/16 2322  GLUCAP 87   Studies/Results: Dg Chest Port 1 View  Result Date: 11/25/2016 CLINICAL DATA:  Severe shortness of breath and diarrhea. EXAM: PORTABLE CHEST 1 VIEW COMPARISON:  07/20/2016; 05/27/2014. FINDINGS: Grossly unchanged borderline enlarged cardiac silhouette. Atherosclerotic plaque when the thoracic  aorta. Pulmonary vasculature appears indistinct with cephalization of flow. Perihilar heterogeneous opacities, right greater than left. Suspected trace pleural effusions. No pneumothorax. No acute osseus abnormalities. A vascular stent overlies the medial aspect of the left upper arm. IMPRESSION: 1. Findings most worrisome for pulmonary edema and trace bilateral effusions, though note, developing infection not excluded. Continued attention on follow-up is recommended. 2.  Aortic Atherosclerosis (ICD10-170.0) Electronically Signed   By: Sandi Mariscal M.D.   On: 11/25/2016 16:12   Medications: . nitroGLYCERIN 60 mcg/min (11/26/16 0448)   . amLODipine  10 mg Oral Daily  . calcitRIOL  0.75 mcg Oral Q M,W,F  . carvedilol  25 mg Oral BID WC  . chlorhexidine  15 mL Mouth Rinse BID  . furosemide  40 mg Oral Daily  . heparin  5,000 Units Subcutaneous Q8H  . insulin aspart  0-9 Units Subcutaneous TID WC  . isosorbide mononitrate  30 mg Oral BID  . lisinopril  40 mg Oral QHS  . mouth rinse  15 mL Mouth Rinse q12n4p  . sodium chloride flush  3 mL Intravenous Q12H  . sodium chloride flush  3 mL Intravenous Q12H   Dialysis prescription: MWF [place] 3.5 hours (may benefit from longer TMT time 2K2Ca EDW 63 kg (does not get to) Mircera 225 Q2 (last give 1/24) Calcitriol 0.75 TIW Heparin 5000 units  Background: 76 y.o. year-old woman with  ESRD 2/2 DM/ HTN (MWF [place]), poorly controlled HTN, h/o NSTEMI 2015. Presented to [facility] ED today w/severe SOB, "feeling poorly, diarrhea, nausea, some vomiting, non-productive cough. Did not go to HD because felt bad. Last treatment was 11/23/16. EDW is 63 kg but leaving several kg over (left at 65.5 on 1/24)  with significant HTN.  BP recorded as high as 244 systolic, O2 sats as low as 80% in [facility] ED. CXR w/pleural effusions, edema. She was treated with BIPAP, IV nitroglycerin with some improvement in her symptoms. Transferred to [facility] for further management and for HD. Says  compliant with her BP meds (and that her husband assures that she takes)  Assessment/Recommendations 1. Acute resp failure - Pulm edema w/hypoxemia  1. Seems to be not as much vol overload (by exam, though clearly w/small effusions on CXR) as high afterload from uncontrolled HTN and on review of outpt tmt data, BP's quite high at HD.  2. Needs better BP control.  3. May benefit from longer treatment time than 3.5 hours to get closer to EDW (though volume may not be the biggest issue here).(dialyzing for 4 hours today)  4. Wean nitro.  5. ECHO to re-eval LV function.  6. Wean nitro and BIPAP after HD. 2. ESRD - MWF HD. On HD now 3. Chronic dCHF  4. Hypertensive urgency/emergency  1. Contributing to SOB/flash pulm edema.  2. Needs better BP control as discussed above.  3. Says compliant w/BP meds and if so need to uptick her regimen (currently on amlodipine/lisinopril/carvedilol/isosorbide_. 4. Needs to start checking BP's outpt on non-HD days to help w/BP mgmt 5. DM2 - diet controlled 6. Anemia - on outpt Mircera 225 Q2weeks, last dosed 1/24 7. Secondary HPT - calcitriol TIW with HD. Check phos.   [name], MD [facility] [phone] pager 11/26/2016, 7:42 AM

## 2016-11-26 NOTE — Procedures (Signed)
Pt is resting comfortably on room air. HR-77, RR-23,SpO2-97.  Pt declines the use of BiPAP this evening.

## 2016-11-27 ENCOUNTER — Inpatient Hospital Stay (HOSPITAL_COMMUNITY): Payer: Medicare HMO

## 2016-11-27 DIAGNOSIS — J810 Acute pulmonary edema: Secondary | ICD-10-CM

## 2016-11-27 DIAGNOSIS — I10 Essential (primary) hypertension: Secondary | ICD-10-CM

## 2016-11-27 DIAGNOSIS — I509 Heart failure, unspecified: Secondary | ICD-10-CM

## 2016-11-27 LAB — RENAL FUNCTION PANEL
ANION GAP: 10 (ref 5–15)
Albumin: 2.6 g/dL — ABNORMAL LOW (ref 3.5–5.0)
BUN: 24 mg/dL — ABNORMAL HIGH (ref 6–20)
CHLORIDE: 95 mmol/L — AB (ref 101–111)
CO2: 28 mmol/L (ref 22–32)
Calcium: 7.8 mg/dL — ABNORMAL LOW (ref 8.9–10.3)
Creatinine, Ser: 4.27 mg/dL — ABNORMAL HIGH (ref 0.44–1.00)
GFR calc non Af Amer: 9 mL/min — ABNORMAL LOW (ref >60)
GFR, EST AFRICAN AMERICAN: 11 mL/min — AB (ref >60)
Glucose, Bld: 91 mg/dL (ref 65–99)
POTASSIUM: 3.2 mmol/L — AB (ref 3.5–5.1)
Phosphorus: 4.9 mg/dL — ABNORMAL HIGH (ref 2.5–4.6)
Sodium: 133 mmol/L — ABNORMAL LOW (ref 135–145)

## 2016-11-27 LAB — ECHOCARDIOGRAM COMPLETE
HEIGHTINCHES: 65 in
Weight: 2228.8 oz

## 2016-11-27 LAB — GLUCOSE, CAPILLARY
GLUCOSE-CAPILLARY: 145 mg/dL — AB (ref 65–99)
Glucose-Capillary: 86 mg/dL (ref 65–99)

## 2016-11-27 LAB — HEPATITIS B SURFACE ANTIGEN: Hepatitis B Surface Ag: NEGATIVE

## 2016-11-27 NOTE — Care Management Note (Addendum)
Case Management Note Marvetta Gibbons RN, BSN Unit 2W-Case Manager [phone]  Patient Details  Name: [name] MRN: [id number] Date of Birth: Jun 17, 1941  Subjective/Objective:     Pt admitted with acute resp.Failure- pt is HD-M/W/F               Action/Plan: PTA pt lived at home with family- plan to return home- per PT eval recommendation for [facility] and RW- orders have been placed- spoke with pt at bedside- list provided for [facility] agencies in [facility] for choice- pt states she is not sure she need HHPT- reports that she feels close to baseline- would like to speak with her daughter who is at home- attempted to call daughter- no answer- pt wants to go home and discuss with daughter- before making choice about [facility] services- will take list and have given pt this CM phone # to call back to arrange [facility] and make referral-  Have notified Brad with [facility] for DME need- RW to be delivered to room prior to discharge.   Expected Discharge Date:  11/27/16               Expected Discharge Plan:  [place]  In-House Referral:     Discharge planning Services  CM Consult  Post Acute Care Choice:  Durable Medical Equipment, Home Health Choice offered to:  Patient  DME Arranged:  [facility] rolling DME Agency:  [place] Arranged:  PT-- pt refused [place] Agency:     Status of Service:  In process, will continue to follow  If discussed at Long Length of Stay Meetings, dates discussed:    Additional Comments:  11/27/16- 1600- Corah Willeford RN, CM- call made to pt post discharge to f/u on [facility] choice- per conversation with pt via TC- pt states that she has decided not to do the [facility] services- states that she is close to baseline and moving around well since discharge with the RW. Pt has declined any HH referral at this time.   Dawayne Patricia, RN 11/27/2016, 11:54 AM

## 2016-11-27 NOTE — Discharge Summary (Signed)
PATIENT DETAILS Name: [name] [name] Age: 76 y.o. Sex: female Date of Birth: Mar 15, 1941 MRN: [id number]. Admitting Physician: Mariel Aloe, MD TWS:[id number] R Carollee Herter, DO  Admit Date: 11/25/2016 Discharge date: 11/27/2016  Recommendations for Outpatient Follow-up:  1. Follow up with PCP in 1-2 weeks 2. Please obtain BMP/CBC in one week 3. 2-D echocardiogram results pending at the time of discharge-please follow.  Admitted From:  Home  Disposition: Home with home health services   Vonore: No  Equipment/Devices: None  Discharge Condition: Stable  CODE STATUS: FULL CODE  Diet recommendation:  Heart Healthy / Carb Modified  Brief Summary: See H&P, Labs, Consult and Test reports for all details in brief, Patient is a 76 y.o. female with history of ESRD, type 2 diabetes, hypertension presented to the hospital with cough, shortness of breath-found to have pulmonary edema in a setting of missed hemodialysis, and malignant hypertension. Started on BiPAP, nitroglycerin infusion and admitted to the hospitalist service. See below for further details  Brief Hospital Course: Acute respiratory failure with hypoxia due to pulmonary edema in the setting of missed hemodialysis and malignant hypertension: Resolved with HD-now on room air. Liberated off the BiPAP as of 1/27 and is on room air since then. Echocardiogram report ending at the time of discharge please follow.   Malignant hypertension: BP much better controlled post HD- with Amlodipine, Coreg,Lisinopril, and Imdur.No longer on IV NTG.Follow BP trend on oral medications and adjust accordingly in the outpatient setting.  ESRD: on hemodialysis-MWF. Nephrology followed closely during this hospital stay-patient to resume usual outpatient dialysis regimen/schedule. Nephrology made aware of patient's discharge plans today.  Acute on Chronic diastolic heart failure: See above-volume removal during hemodialysis. Suspect  pulmonary edema precipitated from uncontrolled hypertension and missed hemodialysis.  Type 2 diabetes: CBGs stable with SSI-diet controlled at home  Anemia: Hemoglobin stable-likely related to ESRD-defer to nephrology  Deconditioning: Seen by physical therapy-home health services recommended-this has been ordered.  Procedures/Studies: None  Discharge Diagnoses:  Principal Problem:   Acute respiratory failure with hypoxia (HCC) Active Problems:   Type II diabetes mellitus with nephropathy [facility])   Essential hypertension   GERD   Hypertensive emergency   Pulmonary edema   Chronic diastolic heart failure [facility])   Discharge Instructions:  Activity:  As tolerated with Full fall precautions use walker/cane & assistance as needed   Discharge Instructions    (HEART FAILURE PATIENTS) Call MD:  Anytime you have any of the following symptoms: 1) 3 pound weight gain in 24 hours or 5 pounds in 1 week 2) shortness of breath, with or without a dry hacking cough 3) swelling in the hands, feet or stomach 4) if you have to sleep on extra pillows at night in order to breathe.    Complete by:  As directed    Call MD for:  difficulty breathing, headache or visual disturbances    Complete by:  As directed    Call MD for:  redness, tenderness, or signs of infection (pain, swelling, redness, odor or green/yellow discharge around incision site)    Complete by:  As directed    Diet - low sodium heart healthy    Complete by:  As directed    Increase activity slowly    Complete by:  As directed      Allergies as of 11/27/2016      Reactions   Hydralazine Itching   Robaxin [methocarbamol]    Dizziness       Medication List  STOP taking these medications   losartan 100 MG tablet Commonly known as:  COZAAR     TAKE these medications   acetaminophen 325 MG tablet Commonly known as:  TYLENOL Take 325 mg by mouth every 6 (six) hours as needed for moderate pain.   amLODipine 10 MG  tablet Commonly known as:  NORVASC TAKE 1 TABLET BY MOUTH EVERY DAY What changed:  See the new instructions.   calcium acetate 667 MG capsule Commonly known as:  PHOSLO Take 1 capsule (667 mg total) by mouth 2 (two) times daily.   carvedilol 25 MG tablet Commonly known as:  COREG TAKE 1 TABLET (25 MG TOTAL) BY MOUTH 2 (TWO) TIMES DAILY WITH A MEAL.   furosemide 40 MG tablet Commonly known as:  LASIX TAKE 1 TABLET BY MOUTH EVERY DAY What changed:  See the new instructions.   isosorbide mononitrate 30 MG 24 hr tablet Commonly known as:  IMDUR TAKE 1 TABLET BY MOUTH TWICE A DAY What changed:  See the new instructions.   lisinopril 40 MG tablet Commonly known as:  PRINIVIL,ZESTRIL Take 40 mg by mouth at bedtime. What changed:  Another medication with the same name was removed. Continue taking this medication, and follow the directions you see here.   multivitamin with minerals Tabs tablet Take 2 tablets by mouth daily.            Durable Medical Equipment        Start     Ordered   11/27/16 [phone]  For home use only DME Walker rolling  Once    Question:  Patient needs a walker to treat with the following condition  Answer:  Muscular deconditioning   11/27/16 0909     Follow-up Information    Rosalita Chessman Chase, DO. Schedule an appointment as soon as possible for a visit in 1 week(s).   Specialty:  Family Medicine Contact information: [address] [place] [address] [phone]        Hemodialysis center Follow up.   Why:  Please follow at your usual schedule         Allergies  Allergen Reactions  . Hydralazine Itching  . Robaxin [Methocarbamol]     Dizziness     Consultations:   nephrology  Other Procedures/Studies: Dg Chest Port 1 View  Result Date: 11/25/2016 CLINICAL DATA:  Severe shortness of breath and diarrhea. EXAM: PORTABLE CHEST 1 VIEW COMPARISON:  07/20/2016; 05/27/2014. FINDINGS: Grossly unchanged borderline  enlarged cardiac silhouette. Atherosclerotic plaque when the thoracic aorta. Pulmonary vasculature appears indistinct with cephalization of flow. Perihilar heterogeneous opacities, right greater than left. Suspected trace pleural effusions. No pneumothorax. No acute osseus abnormalities. A vascular stent overlies the medial aspect of the left upper arm. IMPRESSION: 1. Findings most worrisome for pulmonary edema and trace bilateral effusions, though note, developing infection not excluded. Continued attention on follow-up is recommended. 2.  Aortic Atherosclerosis (ICD10-170.0) Electronically Signed   By: Sandi Mariscal M.D.   On: 11/25/2016 16:12      TODAY-DAY OF DISCHARGE:  Subjective:   Zula Hovsepian today has no headache,no chest abdominal pain,no new weakness tingling or numbness, feels much better wants to go home today.   Objective:   Blood pressure (!) 140/50, pulse 78, temperature 98.9 F (37.2 C), resp. rate 17, height 5\' 5"  (1.651 m), weight 63.2 kg (139 lb 4.8 oz), SpO2 96 %.  Intake/Output Summary (Last 24 hours) at 11/27/16 1106 Last data filed at  11/27/16 1012  Gross per 24 hour  Intake              480 ml  Output              600 ml  Net             -120 ml   Filed Weights   11/26/16 0435 11/26/16 0500 11/27/16 0500  Weight: 68.5 kg (151 lb) 67 kg (147 lb 11.3 oz) 63.2 kg (139 lb 4.8 oz)    Exam: Awake Alert, Oriented *3, No new F.N deficits, Normal affect [place].AT,PERRAL Supple Neck,No JVD, No cervical lymphadenopathy appriciated.  Symmetrical Chest wall movement, Good air movement bilaterally, CTAB RRR,No Gallops,Rubs or new Murmurs, No Parasternal Heave +ve B.Sounds, Abd Soft, Non tender, No organomegaly appriciated, No rebound -guarding or rigidity. No Cyanosis, Clubbing or edema, No new Rash or bruise   PERTINENT RADIOLOGIC STUDIES: Dg Chest Port 1 View  Result Date: 11/25/2016 CLINICAL DATA:  Severe shortness of breath and diarrhea. EXAM: PORTABLE CHEST 1 VIEW  COMPARISON:  07/20/2016; 05/27/2014. FINDINGS: Grossly unchanged borderline enlarged cardiac silhouette. Atherosclerotic plaque when the thoracic aorta. Pulmonary vasculature appears indistinct with cephalization of flow. Perihilar heterogeneous opacities, right greater than left. Suspected trace pleural effusions. No pneumothorax. No acute osseus abnormalities. A vascular stent overlies the medial aspect of the left upper arm. IMPRESSION: 1. Findings most worrisome for pulmonary edema and trace bilateral effusions, though note, developing infection not excluded. Continued attention on follow-up is recommended. 2.  Aortic Atherosclerosis (ICD10-170.0) Electronically Signed   By: Sandi Mariscal M.D.   On: 11/25/2016 16:12     PERTINENT LAB RESULTS: CBC:  Recent Labs  11/25/16 1559 11/25/16 1609  WBC 5.3  --   HGB 10.2* 11.9*  HCT 33.2* 35.0*  PLT 146*  --    CMET CMP     Component Value Date/Time   NA 133 (L) 11/27/2016 0338   K 3.2 (L) 11/27/2016 0338   CL 95 (L) 11/27/2016 0338   CO2 28 11/27/2016 0338   GLUCOSE 91 11/27/2016 0338   GLUCOSE 70 10/10/2006 1540   BUN 24 (H) 11/27/2016 0338   CREATININE 4.27 (H) 11/27/2016 0338   CALCIUM 7.8 (L) 11/27/2016 0338   PROT 7.6 11/25/2016 1559   ALBUMIN 2.6 (L) 11/27/2016 0338   AST 53 (H) 11/25/2016 1559   ALT 31 11/25/2016 1559   ALKPHOS 153 (H) 11/25/2016 1559   BILITOT 0.5 11/25/2016 1559   GFRNONAA 9 (L) 11/27/2016 0338   GFRAA 11 (L) 11/27/2016 0338    GFR Estimated Creatinine Clearance: 10.2 mL/min (by C-G formula based on SCr of 4.27 mg/dL (H)). No results for input(s): LIPASE, AMYLASE in the last 72 hours. No results for input(s): CKTOTAL, CKMB, CKMBINDEX, TROPONINI in the last 72 hours. Invalid input(s): POCBNP No results for input(s): DDIMER in the last 72 hours. No results for input(s): HGBA1C in the last 72 hours. No results for input(s): CHOL, HDL, LDLCALC, TRIG, CHOLHDL, LDLDIRECT in the last 72 hours. No results  for input(s): TSH, T4TOTAL, T3FREE, THYROIDAB in the last 72 hours.  Invalid input(s): FREET3 No results for input(s): VITAMINB12, FOLATE, FERRITIN, TIBC, IRON, RETICCTPCT in the last 72 hours. Coags: No results for input(s): INR in the last 72 hours.  Invalid input(s): PT Microbiology: Recent Results (from the past 240 hour(s))  MRSA PCR Screening     Status: None   Collection Time: 11/25/16  8:52 PM  Result Value Ref Range Status  MRSA by PCR NEGATIVE NEGATIVE Final    Comment:        The GeneXpert MRSA Assay (FDA approved for NASAL specimens only), is one component of a comprehensive MRSA colonization surveillance program. It is not intended to diagnose MRSA infection nor to guide or monitor treatment for MRSA infections.     FURTHER DISCHARGE INSTRUCTIONS:  Get Medicines reviewed and adjusted: Please take all your medications with you for your next visit with your Primary MD  Laboratory/radiological data: Please request your Primary MD to go over all hospital tests and procedure/radiological results at the follow up, please ask your Primary MD to get all Hospital records sent to his/her office.  In some cases, they will be blood work, cultures and biopsy results pending at the time of your discharge. Please request that your primary care M.D. goes through all the records of your hospital data and follows up on these results.  Also Note the following: If you experience worsening of your admission symptoms, develop shortness of breath, life threatening emergency, suicidal or homicidal thoughts you must seek medical attention immediately by calling 911 or calling your MD immediately  if symptoms less severe.  You must read complete instructions/literature along with all the possible adverse reactions/side effects for all the Medicines you take and that have been prescribed to you. Take any new Medicines after you have completely understood and accpet all the possible adverse  reactions/side effects.   Do not drive when taking Pain medications or sleeping medications (Benzodaizepines)  Do not take more than prescribed Pain, Sleep and Anxiety Medications. It is not advisable to combine anxiety,sleep and pain medications without talking with your primary care practitioner  Special Instructions: If you have smoked or chewed Tobacco  in the last 2 yrs please stop smoking, stop any regular Alcohol  and or any Recreational drug use.  Wear Seat belts while driving.  Please note: You were cared for by a hospitalist during your hospital stay. Once you are discharged, your primary care physician will handle any further medical issues. Please note that NO REFILLS for any discharge medications will be authorized once you are discharged, as it is imperative that you return to your primary care physician (or establish a relationship with a primary care physician if you do not have one) for your post hospital discharge needs so that they can reassess your need for medications and monitor your lab values.  Total Time spent coordinating discharge including counseling, education and face to face time equals 45 minutes.  SignedOren Binet 11/27/2016 11:06 AM

## 2016-11-27 NOTE — Evaluation (Signed)
Physical Therapy Evaluation Patient Details Name: [name] [name] MRN: [id number] DOB: 30-Sep-1941 Today's Date: 11/27/2016   History of Present Illness  Patient is a 76 y.o. female with history of ESRD, type 2 diabetes, hypertension presented to the hospital with cough, shortness of breath-found to have pulmonary edema in a setting of missed hemodialysis, and malignant hypertension. Much improved with HD  Clinical Impression   Pt admitted with above diagnosis. Pt currently with functional limitations due to the deficits listed below (see PT Problem List). Overall doing much better per pt; much steadier amb with RW, and I especially recommend using RW when she is feeling weak post HD; We discussed her HTN and increased risk of cardiac disease and CVA; reinforced regular follow-up with PCP -- Ms. Motsinger indicated it is hard to get to her current PCP (has moved office to [employer]); it is worth considering changing to PCP closer to [place]; Pt will benefit from skilled PT to increase their independence and safety with mobility to allow discharge to the venue listed below.       Follow Up Recommendations Home health PT    Equipment Recommendations  Rolling walker with 5" wheels;3in1 (PT)    Recommendations for Other Services   Case Mgmnt to help facilitate changing PCP    Precautions / Restrictions Precautions Precautions: Fall Precaution Comments: Fall risk greatly reduced with use of RW      Mobility  Bed Mobility Overal bed mobility: Needs Assistance Bed Mobility: Supine to Sit     Supine to sit: Supervision     General bed mobility comments: Cues to complete task, to sit fully upright at EOB  Transfers Overall transfer level: Needs assistance Equipment used: None;Rolling walker (2 wheeled) Transfers: Sit to/from Stand Sit to Stand: Min guard         General transfer comment: Cues for safety; noted tendency to brace backs of LEs for  stability  Ambulation/Gait Ambulation/Gait assistance: Min guard Ambulation Distance (Feet): 150 Feet Assistive device: None;Rolling walker (2 wheeled) Gait Pattern/deviations: Step-through pattern   Gait velocity interpretation: Below normal speed for age/gender General Gait Details: Inititated walk without assistive device; noted tendency to reach out for UE support, so finished walk with RW (sized for optimal fit); cues for RW proximity and posture  Stairs Stairs: Yes Stairs assistance: Min guard Stair Management: Two rails;Alternating pattern;Forwards Number of Stairs: 4 General stair comments: Took stairs slowly, but no gross difficulty  Wheelchair Mobility    Modified Rankin (Stroke Patients Only)       Balance                                             Pertinent Vitals/Pain Pain Assessment: No/denies pain    Home Living Family/patient expects to be discharged to:: Private residence Living Arrangements: Spouse/significant other Available Help at Discharge: Family;Available 24 hours/day Type of Home: House Home Access: Stairs to enter Entrance Stairs-Rails: Right;Left;Can reach both Entrance Stairs-Number of Steps: 3 Home Layout: One level Home Equipment: None      Prior Function Level of Independence: Independent         Comments: REports often needs assist from husband post HD     Hand Dominance        Extremity/Trunk Assessment   Upper Extremity Assessment Upper Extremity Assessment: Overall WFL for tasks assessed (for simple tasks)    Lower  Extremity Assessment Lower Extremity Assessment: Generalized weakness    Cervical / Trunk Assessment Cervical / Trunk Assessment: Normal  Communication   Communication: No difficulties  Cognition Arousal/Alertness: Awake/alert Behavior During Therapy: WFL for tasks assessed/performed Overall Cognitive Status: Within Functional Limits for tasks assessed                       General Comments General comments (skin integrity, edema, etc.): BP was elevated during session; 161/60 post walk; O2 sats remained greater than or equal to 93%    Exercises     Assessment/Plan    PT Assessment Patient needs continued PT services  PT Problem List Decreased strength;Decreased activity tolerance;Decreased balance;Decreased mobility;Decreased coordination;Decreased knowledge of use of DME;Decreased safety awareness          PT Treatment Interventions DME instruction;Gait training;Stair training;Functional mobility training;Therapeutic activities;Therapeutic exercise;Balance training;Patient/family education    PT Goals (Current goals can be found in the Care Plan section)  Acute Rehab PT Goals Patient Stated Goal: get home soon; consider switching PCP to someone closer to [place] PT Goal Formulation: With patient Time For Goal Achievement: 12/11/16 Potential to Achieve Goals: Good    Frequency Min 3X/week   Barriers to discharge        Co-evaluation               End of Session Equipment Utilized During Treatment: Gait belt Activity Tolerance: Patient tolerated treatment well Patient left: in chair;with call bell/phone within reach;with chair alarm set;with family/visitor present Nurse Communication: Mobility status         Time: 1962-2297 PT Time Calculation (min) (ACUTE ONLY): 37 min   Charges:   PT Evaluation $PT Eval Low Complexity: 1 Procedure PT Treatments $Gait Training: 8-22 mins   PT G Codes:        Colletta [place] 11/27/2016, 8:57 AM  Roney Marion, [place] Pager [phone] Office [phone]

## 2016-11-27 NOTE — Progress Notes (Signed)
[facility] Rounding Note Subjective/Interval History:  Looks great today Ambulating in the hall with a walker (PT says needs one for discharge) Says "might go home today" Had 4 hour HD, finished up early yesterday AM Unfortunately can't tell what post HD weight was, and I can't find the net UF recorded from the treatment (My goal was to have 4L removed) Today's weight recorded at 63.2  Objective Vital signs in last 24 hours: Vitals:   11/26/16 2000 11/26/16 2122 11/27/16 0042 11/27/16 0500  BP: (!) 164/50  (!) 114/56 (!) 140/50  Pulse: 80 77 67 78  Resp: (!) 21 19 14 15   Temp: 99.1 F (37.3 C)  99.7 F (37.6 C) 98.9 F (37.2 C)  TempSrc:      SpO2: 97% 95% 96% 95%  Weight:    63.2 kg (139 lb 4.8 oz)  Height:       Weight change: -2.586 kg (-5 lb 11.2 oz)  Intake/Output Summary (Last 24 hours) at 11/27/16 0833 Last data filed at 11/27/16 0831  Gross per 24 hour  Intake              480 ml  Output              100 ml  Net              380 ml   Physical Exam:  Blood pressure (!) 140/50, pulse 78, temperature 98.9 F (37.2 C), resp. rate 15, height 5\' 5"  (1.651 m), weight 63.2 kg (139 lb 4.8 oz), SpO2 95 %.  VS as noted Walking the hall with PT, no oxygen requirement Lungs clear S1S2 + S4 Abd soft no tenderness LE's no sig edema (and no LE edema on admission either) L upper arm AVG + bruit    Recent Labs Lab 11/25/16 1559 11/25/16 1609 11/26/16 0401 11/27/16 0338  NA 137 137 137 133*  K 4.2 4.2 4.3 3.2*  CL 99* 100* 99* 95*  CO2 26  --  25 28  GLUCOSE 99 100* 82 91  BUN 53* 47* 53* 24*  CREATININE 6.00* 6.40* 6.62* 4.27*  CALCIUM 8.1*  --  7.9* 7.8*  PHOS  --   --   --  4.9*    Recent Labs Lab 11/25/16 1559 11/27/16 0338  AST 53*  --   ALT 31  --   ALKPHOS 153*  --   BILITOT 0.5  --   PROT 7.6  --   ALBUMIN 3.6 2.6*    Recent Labs Lab 11/25/16 1559 11/25/16 1609  WBC 5.3  --   NEUTROABS 4.0  --   HGB 10.2* 11.9*  HCT 33.2* 35.0*  MCV 91.2  --    PLT 146*  --     Recent Labs Lab 11/26/16 0741 11/26/16 1131 11/26/16 1609 11/26/16 2113 11/27/16 0735  GLUCAP 72 81 129* 96 86   Studies/Results: Dg Chest Port 1 View  Result Date: 11/25/2016 CLINICAL DATA:  Severe shortness of breath and diarrhea. EXAM: PORTABLE CHEST 1 VIEW COMPARISON:  07/20/2016; 05/27/2014. FINDINGS: Grossly unchanged borderline enlarged cardiac silhouette. Atherosclerotic plaque when the thoracic aorta. Pulmonary vasculature appears indistinct with cephalization of flow. Perihilar heterogeneous opacities, right greater than left. Suspected trace pleural effusions. No pneumothorax. No acute osseus abnormalities. A vascular stent overlies the medial aspect of the left upper arm. IMPRESSION: 1. Findings most worrisome for pulmonary edema and trace bilateral effusions, though note, developing infection not excluded. Continued attention on follow-up is recommended. 2.  Aortic  Atherosclerosis (ICD10-170.0) Electronically Signed   By: Sandi Mariscal M.D.   On: 11/25/2016 16:12   Medications: . nitroGLYCERIN Stopped (11/26/16 1446)   . amLODipine  10 mg Oral Daily  . calcitRIOL  0.75 mcg Oral Q M,W,F  . carvedilol  25 mg Oral BID WC  . chlorhexidine  15 mL Mouth Rinse BID  . heparin  5,000 Units Subcutaneous Q8H  . insulin aspart  0-9 Units Subcutaneous TID WC  . isosorbide mononitrate  30 mg Oral BID  . lisinopril  40 mg Oral QHS  . mouth rinse  15 mL Mouth Rinse q12n4p  . sodium chloride flush  3 mL Intravenous Q12H  . sodium chloride flush  3 mL Intravenous Q12H   Dialysis prescription: [facility]WF [place] 3.5 hours (may benefit from longer TMT time) 2K2Ca EDW 63 kg (does not get to generally) Mircera 225 Q2 (last give 1/24) Calcitriol 0.75 TIW Heparin 5000 units  Background: 76 y.o. year-old woman with  ESRD 2/2 DM/ HTN (MWF [place]), poorly controlled HTN, h/o NSTEMI 2015. Presented to [facility] ED today w/severe SOB, "feeling poorly, diarrhea, nausea, some vomiting,  non-productive cough. Did not go to HD because felt bad. Last treatment was 11/23/16. EDW is 63 kg but leaving several kg over (left at 65.5 on 1/24)  with significant HTN.  BP recorded as high as 817 systolic, O2 sats as low as 80% in [facility] ED. CXR w/pleural effusions, edema. She was treated with BIPAP, IV nitroglycerin with some improvement in her symptoms. Transferred to [facility] for further management and for HD. Says compliant with her BP meds (and that her husband assures that she takes)  Assessment 1. Acute resp failure - Pulm edema w/hypoxemia. I think was not as much vol overload (by exam, though clearly w/small effusions on CXR) as high afterload from uncontrolled HTN and on review of outpt tmt data, BP's quite high at outpt HD. Resolved now post fluid removal and BP control Off BIPAP, breathing fine now, BP controlled and weight down (I think) 1. May benefit from longer treatment time than 3.5 hours as outpt to get closer to EDW without severe cramping 2. ESRD - MWF HD. For now increase TMT time to 4 hours 3. Chronic dCHF - ECHO still pending to reevaluate LVFx 4. Hypertensive urgency/emergency- I think was MAJOR contributor to SOB/flash pulm edema. BP looks MUCH better now on combination of amlodipine/lisinopril/carvedilol/isosorbide. Advised needs to purchase BP cuff and start checking BP's outpt on non-HD days to help w/BP mgmt 5. DM2 - diet controlled 6. Anemia - on outpt Mircera 225 Q2weeks, last dosed 1/24 and no intervention needed at this time 7. Secondary HPT - calcitriol TIW with HD.   Recommendations: if still here tomorrow, HD first round.   [name], MD [facility] [phone] pager 11/27/2016, 8:33 AM

## 2016-11-27 NOTE — Progress Notes (Signed)
PROGRESS NOTE        PATIENT DETAILS Name: [name] [name] Age: 76 y.o. Sex: female Date of Birth: 1941/07/07 Admit Date: 11/25/2016 Admitting Physician Mariel Aloe, MD MLY:[id number] R Carollee Herter, DO  Brief Narrative: Patient is a 76 y.o. female with history of ESRD, type 2 diabetes, hypertension presented to the hospital with cough, shortness of breath-found to have pulmonary edema in a setting of missed hemodialysis, and malignant hypertension. Started on BiPAP, nitroglycerin infusion and admitted to the hospitalist service. See below for further details  Subjective: Feels much better. No SOB-on room air.  Assessment/Plan: Principal Problem:  Acute respiratory failure with hypoxia due to pulmonary edema in the setting of missed hemodialysis and malignant hypertension: Resolved with HD-now on room air. Await echocardiogram.  Malignant hypertension: BP much better controlled post HD- with Amlodipine, Coreg,Lisinopril, and Imdur.No longer on IV NTG.Follow BP trend on oral medications and adjust accordingly.  ESRD: on hemodialysis-MWF. Nephrology following  Acute on Chronic diastolic heart failure: See above-volume removal during hemodialysis. Suspect pulmonary edema precipitated from uncontrolled hypertension and missed hemodialysis.  Type 2 diabetes: CBGs stable with SSI-diet controlled at home  Anemia: Hemoglobin stable-likely related to ESRD-defer to nephrology  DVT Prophylaxis: Prophylactic Heparin   Code Status: Full code   Family Communication: Daughter members at bedside  Disposition Plan: Remain inpatient-home in 1 day  Antimicrobial agents: Anti-infectives    None      Procedures: None  CONSULTS:  nephrology  Time spent: 25- minutes-Greater than 50% of this time was spent in counseling, explanation of diagnosis, planning of further management, and coordination of care.  MEDICATIONS: Scheduled Meds: . amLODipine  10 mg Oral  Daily  . calcitRIOL  0.75 mcg Oral Q M,W,F  . carvedilol  25 mg Oral BID WC  . chlorhexidine  15 mL Mouth Rinse BID  . heparin  5,000 Units Subcutaneous Q8H  . insulin aspart  0-9 Units Subcutaneous TID WC  . isosorbide mononitrate  30 mg Oral BID  . lisinopril  40 mg Oral QHS  . mouth rinse  15 mL Mouth Rinse q12n4p  . sodium chloride flush  3 mL Intravenous Q12H  . sodium chloride flush  3 mL Intravenous Q12H   Continuous Infusions: . nitroGLYCERIN Stopped (11/26/16 1446)   PRN Meds:.sodium chloride, acetaminophen **OR** acetaminophen, lip balm, sodium chloride flush   PHYSICAL EXAM: Vital signs: Vitals:   11/26/16 2000 11/26/16 2122 11/27/16 0042 11/27/16 0500  BP: (!) 164/50  (!) 114/56 (!) 140/50  Pulse: 80 77 67 78  Resp: (!) 21 19 14 15   Temp: 99.1 F (37.3 C)  99.7 F (37.6 C) 98.9 F (37.2 C)  TempSrc:      SpO2: 97% 95% 96% 95%  Weight:    63.2 kg (139 lb 4.8 oz)  Height:       Filed Weights   11/26/16 0435 11/26/16 0500 11/27/16 0500  Weight: 68.5 kg (151 lb) 67 kg (147 lb 11.3 oz) 63.2 kg (139 lb 4.8 oz)   Body mass index is 23.18 kg/m.   General appearance :Awake, alert,on BiPAP Eyes:, pupils equally reactive to light and accomodation,no scleral icterus. HEENT: Atraumatic and Normocephalic Neck: supple, no JVD. No cervical lymphadenopathy. No thyromegaly Resp:Good air entry bilaterally, few bibasilares  CVS: S1 S2 regular  GI: Bowel sounds present, Non tender and not distended with  no gaurding, rigidity or rebound.No organomegaly Extremities: B/L Lower Ext shows no edema, both legs are warm to touch Neurology:  speech clear,Non focal, sensation is grossly intact. Musculoskeletal:No digital cyanosis Skin:No Rash, warm and dry Wounds:N/A  I have personally reviewed following labs and imaging studies  LABORATORY DATA: CBC:  Recent Labs Lab 11/25/16 1559 11/25/16 1609  WBC 5.3  --   NEUTROABS 4.0  --   HGB 10.2* 11.9*  HCT 33.2* 35.0*  MCV  91.2  --   PLT 146*  --     Basic Metabolic Panel:  Recent Labs Lab 11/25/16 1559 11/25/16 1609 11/26/16 0401 11/27/16 0338  NA 137 137 137 133*  K 4.2 4.2 4.3 3.2*  CL 99* 100* 99* 95*  CO2 26  --  25 28  GLUCOSE 99 100* 82 91  BUN 53* 47* 53* 24*  CREATININE 6.00* 6.40* 6.62* 4.27*  CALCIUM 8.1*  --  7.9* 7.8*  PHOS  --   --   --  4.9*    GFR: Estimated Creatinine Clearance: 10.2 mL/min (by C-G formula based on SCr of 4.27 mg/dL (H)).  Liver Function Tests:  Recent Labs Lab 11/25/16 1559 11/27/16 0338  AST 53*  --   ALT 31  --   ALKPHOS 153*  --   BILITOT 0.5  --   PROT 7.6  --   ALBUMIN 3.6 2.6*   No results for input(s): LIPASE, AMYLASE in the last 168 hours. No results for input(s): AMMONIA in the last 168 hours.  Coagulation Profile: No results for input(s): INR, PROTIME in the last 168 hours.  Cardiac Enzymes: No results for input(s): CKTOTAL, CKMB, CKMBINDEX, TROPONINI in the last 168 hours.  BNP (last 3 results) No results for input(s): PROBNP in the last 8760 hours.  HbA1C: No results for input(s): HGBA1C in the last 72 hours.  CBG:  Recent Labs Lab 11/26/16 0741 11/26/16 1131 11/26/16 1609 11/26/16 2113 11/27/16 0735  GLUCAP 72 81 129* 96 86    Lipid Profile: No results for input(s): CHOL, HDL, LDLCALC, TRIG, CHOLHDL, LDLDIRECT in the last 72 hours.  Thyroid Function Tests: No results for input(s): TSH, T4TOTAL, FREET4, T3FREE, THYROIDAB in the last 72 hours.  Anemia Panel: No results for input(s): VITAMINB12, FOLATE, FERRITIN, TIBC, IRON, RETICCTPCT in the last 72 hours.  Urine analysis:    Component Value Date/Time   COLORURINE YELLOW 11/25/2016 1630   APPEARANCEUR CLEAR 11/25/2016 1630   LABSPEC 1.010 11/25/2016 1630   PHURINE 8.0 11/25/2016 1630   GLUCOSEU NEGATIVE 11/25/2016 1630   HGBUR NEGATIVE 11/25/2016 1630   BILIRUBINUR NEGATIVE 11/25/2016 1630   KETONESUR NEGATIVE 11/25/2016 1630   PROTEINUR >=300 (A)  11/25/2016 1630   UROBILINOGEN 2.0 (H) 04/01/2008 1751   NITRITE NEGATIVE 11/25/2016 1630   LEUKOCYTESUR NEGATIVE 11/25/2016 1630    Sepsis Labs: Lactic Acid, Venous    Component Value Date/Time   LATICACIDVEN 1.24 05/17/2014 1710    MICROBIOLOGY: Recent Results (from the past 240 hour(s))  MRSA PCR Screening     Status: None   Collection Time: 11/25/16  8:52 PM  Result Value Ref Range Status   MRSA by PCR NEGATIVE NEGATIVE Final    Comment:        The GeneXpert MRSA Assay (FDA approved for NASAL specimens only), is one component of a comprehensive MRSA colonization surveillance program. It is not intended to diagnose MRSA infection nor to guide or monitor treatment for MRSA infections.     RADIOLOGY STUDIES/RESULTS: Dg  Chest Port 1 View  Result Date: 11/25/2016 CLINICAL DATA:  Severe shortness of breath and diarrhea. EXAM: PORTABLE CHEST 1 VIEW COMPARISON:  07/20/2016; 05/27/2014. FINDINGS: Grossly unchanged borderline enlarged cardiac silhouette. Atherosclerotic plaque when the thoracic aorta. Pulmonary vasculature appears indistinct with cephalization of flow. Perihilar heterogeneous opacities, right greater than left. Suspected trace pleural effusions. No pneumothorax. No acute osseus abnormalities. A vascular stent overlies the medial aspect of the left upper arm. IMPRESSION: 1. Findings most worrisome for pulmonary edema and trace bilateral effusions, though note, developing infection not excluded. Continued attention on follow-up is recommended. 2.  Aortic Atherosclerosis (ICD10-170.0) Electronically Signed   By: Sandi Mariscal M.D.   On: 11/25/2016 16:12     LOS: 2 days   Oren Binet, MD  [facility] Pager:[phone]  If 7PM-7AM, please contact night-coverage [email or web address] Password [facility] 11/27/2016, 7:45 AM

## 2016-11-27 NOTE — Progress Notes (Signed)
  Echocardiogram 2D Echocardiogram has been performed.  [name] [name] 11/27/2016, 9:42 AM

## 2016-11-28 ENCOUNTER — Telehealth: Payer: Self-pay | Admitting: Behavioral Health

## 2016-11-28 DIAGNOSIS — N186 End stage renal disease: Secondary | ICD-10-CM | POA: Diagnosis not present

## 2016-11-28 DIAGNOSIS — N2581 Secondary hyperparathyroidism of renal origin: Secondary | ICD-10-CM | POA: Diagnosis not present

## 2016-11-28 LAB — HEMOGLOBIN A1C
Hgb A1c MFr Bld: 4.7 % — ABNORMAL LOW (ref 4.8–5.6)
Mean Plasma Glucose: 88 mg/dL

## 2016-11-28 NOTE — Telephone Encounter (Signed)
Transition Care Management Follow-up Telephone Call  BHA:[id number] R Carollee Herter, DO  Admit Date: 11/25/2016 Discharge date: 11/27/2016  Recommendations for Outpatient Follow-up:  1. Follow up with PCP in 1-2 weeks 2. Please obtain BMP/CBC in one week 3. 2-D echocardiogram results pending at the time of discharge-please follow.   How have you been since you were released from the hospital? Patient stated, "I'm feeling pretty good today".   Do you understand why you were in the hospital? yes   Do you understand the discharge instructions? yes   Where were you discharged to? Home with spouse.   Items Reviewed:  Medications reviewed: yes  Allergies reviewed: yes   Dietary changes reviewed: yes, heart healthy-carb modified  Referrals reviewed: yes, Follow up with PCP in 1-2 weeks   Functional Questionnaire:   Activities of Daily Living (ADLs):   She states they are independent in the following: bathing and hygiene, feeding, continence, grooming, toileting and dressing States they require assistance with the following: ambulating with a walker.   Any transportation issues/concerns?: no   Any patient concerns? no   Confirmed importance and date/time of follow-up visits scheduled yes, 12/08/16 at 11:30 AM.  Provider Appointment booked with Dr. Carollee Herter.  Confirmed with patient if condition begins to worsen call PCP or go to the ER.  Patient was given the office number and encouraged to call back with question or concerns.  : yes

## 2016-11-30 DIAGNOSIS — N186 End stage renal disease: Secondary | ICD-10-CM | POA: Diagnosis not present

## 2016-11-30 DIAGNOSIS — N2581 Secondary hyperparathyroidism of renal origin: Secondary | ICD-10-CM | POA: Diagnosis not present

## 2016-11-30 DIAGNOSIS — Z992 Dependence on renal dialysis: Secondary | ICD-10-CM | POA: Diagnosis not present

## 2016-11-30 DIAGNOSIS — E1122 Type 2 diabetes mellitus with diabetic chronic kidney disease: Secondary | ICD-10-CM | POA: Diagnosis not present

## 2016-12-02 DIAGNOSIS — N186 End stage renal disease: Secondary | ICD-10-CM | POA: Diagnosis not present

## 2016-12-02 DIAGNOSIS — N2581 Secondary hyperparathyroidism of renal origin: Secondary | ICD-10-CM | POA: Diagnosis not present

## 2016-12-05 DIAGNOSIS — N186 End stage renal disease: Secondary | ICD-10-CM | POA: Diagnosis not present

## 2016-12-05 DIAGNOSIS — N2581 Secondary hyperparathyroidism of renal origin: Secondary | ICD-10-CM | POA: Diagnosis not present

## 2016-12-07 DIAGNOSIS — N186 End stage renal disease: Secondary | ICD-10-CM | POA: Diagnosis not present

## 2016-12-07 DIAGNOSIS — N2581 Secondary hyperparathyroidism of renal origin: Secondary | ICD-10-CM | POA: Diagnosis not present

## 2016-12-08 ENCOUNTER — Telehealth: Payer: Self-pay | Admitting: Family Medicine

## 2016-12-08 ENCOUNTER — Encounter: Payer: Self-pay | Admitting: Family Medicine

## 2016-12-08 ENCOUNTER — Ambulatory Visit (INDEPENDENT_AMBULATORY_CARE_PROVIDER_SITE_OTHER): Payer: Medicare HMO | Admitting: Family Medicine

## 2016-12-08 VITALS — BP 140/38 | HR 71 | Temp 98.3°F | Resp 16 | Ht 65.0 in | Wt 138.4 lb

## 2016-12-08 DIAGNOSIS — I10 Essential (primary) hypertension: Secondary | ICD-10-CM | POA: Diagnosis not present

## 2016-12-08 DIAGNOSIS — I5189 Other ill-defined heart diseases: Secondary | ICD-10-CM

## 2016-12-08 DIAGNOSIS — N186 End stage renal disease: Secondary | ICD-10-CM | POA: Diagnosis not present

## 2016-12-08 DIAGNOSIS — E785 Hyperlipidemia, unspecified: Secondary | ICD-10-CM

## 2016-12-08 DIAGNOSIS — I5032 Chronic diastolic (congestive) heart failure: Secondary | ICD-10-CM | POA: Diagnosis not present

## 2016-12-08 NOTE — Progress Notes (Signed)
Pre visit review using our clinic review tool, if applicable. No additional management support is needed unless otherwise documented below in the visit note. 

## 2016-12-08 NOTE — Patient Instructions (Signed)
Heart Failure  Heart failure means your heart has trouble pumping blood. This makes it hard for your body to work well. Heart failure is usually a long-term (chronic) condition. You must take good care of yourself and follow your doctor's treatment plan.  HOME CARE   Take your heart medicine as told by your doctor.    Do not stop taking medicine unless your doctor tells you to.    Do not skip any dose of medicine.    Refill your medicines before they run out.    Take other medicines only as told by your doctor or pharmacist.   Stay active if told by your doctor. The elderly and people with severe heart failure should talk with a doctor about physical activity.   Eat heart-healthy foods. Choose foods that are without trans fat and are low in saturated fat, cholesterol, and salt (sodium). This includes fresh or frozen fruits and vegetables, fish, lean meats, fat-free or low-fat dairy foods, whole grains, and high-fiber foods. Lentils and dried peas and beans (legumes) are also good choices.   Limit salt if told by your doctor.   Cook in a healthy way. Roast, grill, broil, bake, poach, steam, or stir-fry foods.   Limit fluids as told by your doctor.   Weigh yourself every morning. Do this after you pee (urinate) and before you eat breakfast. Write down your weight to give to your doctor.   Take your blood pressure and write it down if your doctor tells you to.   Ask your doctor how to check your pulse. Check your pulse as told.   Lose weight if told by your doctor.   Stop smoking or chewing tobacco. Do not use gum or patches that help you quit without your doctor's approval.   Schedule and go to doctor visits as told.   Nonpregnant women should have no more than 1 drink a day. Men should have no more than 2 drinks a day. Talk to your doctor about drinking alcohol.   Stop illegal drug use.   Stay current with shots (immunizations).   Manage your health conditions as told by your doctor.   Learn to  manage your stress.   Rest when you are tired.   If it is really hot outside:    Avoid intense activities.    Use air conditioning or fans, or get in a cooler place.    Avoid caffeine and alcohol.    Wear loose-fitting, lightweight, and light-colored clothing.   If it is really cold outside:    Avoid intense activities.    Layer your clothing.    Wear mittens or gloves, a hat, and a scarf when going outside.    Avoid alcohol.   Learn about heart failure and get support as needed.   Get help to maintain or improve your quality of life and your ability to care for yourself as needed.  GET HELP IF:    You gain weight quickly.   You are more short of breath than usual.   You cannot do your normal activities.   You tire easily.   You cough more than normal, especially with activity.   You have any or more puffiness (swelling) in areas such as your hands, feet, ankles, or belly (abdomen).   You cannot sleep because it is hard to breathe.   You feel like your heart is beating fast (palpitations).   You get dizzy or light-headed when you stand up.  GET HELP   RIGHT AWAY IF:    You have trouble breathing.   There is a change in mental status, such as becoming less alert or not being able to focus.   You have chest pain or discomfort.   You faint.  MAKE SURE YOU:    Understand these instructions.   Will watch your condition.   Will get help right away if you are not doing well or get worse.     This information is not intended to replace advice given to you by your health care provider. Make sure you discuss any questions you have with your health care provider.     Document Released: 07/26/2008 Document Revised: 11/07/2014 Document Reviewed: 12/03/2012  Elsevier Interactive Patient Education 2017 [employer]

## 2016-12-08 NOTE — Telephone Encounter (Signed)
Dr. Etter Sjogren requesting for patient to transfer to our office because of location as well as spouse is seen at this office.  Would you be able to take patient on?

## 2016-12-09 DIAGNOSIS — N186 End stage renal disease: Secondary | ICD-10-CM | POA: Diagnosis not present

## 2016-12-09 DIAGNOSIS — N2581 Secondary hyperparathyroidism of renal origin: Secondary | ICD-10-CM | POA: Diagnosis not present

## 2016-12-09 NOTE — Assessment & Plan Note (Signed)
con't meds  Check labs 

## 2016-12-09 NOTE — Assessment & Plan Note (Signed)
Per nephrology 

## 2016-12-09 NOTE — Assessment & Plan Note (Signed)
con't meds F/u dialysis

## 2016-12-09 NOTE — Telephone Encounter (Signed)
ok 

## 2016-12-09 NOTE — Assessment & Plan Note (Signed)
Stable con't meds 

## 2016-12-09 NOTE — Progress Notes (Signed)
Patient ID: [name] [name], female    DOB: 10-01-1941  Age: 76 y.o. MRN: [id number]    Subjective:  Subjective  HPI [name] presents for f/u dm htn, and cholesterol.  She was in the hospital with chf -- hx esrd, dmII, htn who presented to er with sob after missing dialysis.  She was admitted and d/c 1/28.   Review of Systems  Constitutional: Negative for appetite change, diaphoresis, fatigue and unexpected weight change.  Eyes: Negative for pain, redness and visual disturbance.  Respiratory: Negative for cough, chest tightness, shortness of breath and wheezing.   Cardiovascular: Negative for chest pain, palpitations and leg swelling.  Endocrine: Negative for cold intolerance, heat intolerance, polydipsia, polyphagia and polyuria.  Genitourinary: Negative for difficulty urinating, dysuria and frequency.  Neurological: Negative for dizziness, light-headedness, numbness and headaches.    History Past Medical History:  Diagnosis Date  . DEPRESSION   . DIABETES MELLITUS, TYPE I   . GERD   . GLAUCOMA   . Headache(784.0)   . Hepatitis B carrier ([name] [name])    05/2009: neg Hep C; Hep B: core pos, Surf neg; fatty liver US 8/10 - 7/13  . HYPERLIPIDEMIA   . HYPERTENSION   . Kidney failure    dialysis 3 times per week  . OSTEOPENIA   . POSTMENOPAUSAL STATUS   . Unspecified vitamin D deficiency     She has a past surgical history that includes Cholecystectomy (02/12/07); Tonsillectomy; Refractive surgery (07/29/09); Insertion of dialysis catheter (Right, 05/27/2014); and AV fistula placement (Left, 05/29/2014).   Her family history includes Arthritis in her other; Coronary artery disease in her other; Diabetes in her other; Hypertension in her other.She reports that she has never smoked. She has never used smokeless tobacco. She reports that she does not drink alcohol or use drugs.  Current Outpatient Prescriptions on File Prior to Visit  Medication Sig Dispense Refill  . acetaminophen  (TYLENOL) 325 MG tablet Take 325 mg by mouth every 6 (six) hours as needed for moderate pain.    Marland Kitchen amLODipine (NORVASC) 10 MG tablet TAKE 1 TABLET BY MOUTH EVERY DAY (Patient taking differently: TAKE 10 mg BY MOUTH EVERY DAY) 90 tablet 3  . carvedilol (COREG) 25 MG tablet TAKE 1 TABLET (25 MG TOTAL) BY MOUTH 2 (TWO) TIMES DAILY WITH A MEAL. 60 tablet 3  . furosemide (LASIX) 40 MG tablet TAKE 1 TABLET BY MOUTH EVERY DAY (Patient taking differently: TAKE 40mg   BY MOUTH EVERY DAY) 30 tablet 0  . isosorbide mononitrate (IMDUR) 30 MG 24 hr tablet TAKE 1 TABLET BY MOUTH TWICE A DAY 60 tablet 0  . lisinopril (PRINIVIL,ZESTRIL) 40 MG tablet Take 40 mg by mouth at bedtime.  10  . Multiple Vitamin (MULTIVITAMIN WITH MINERALS) TABS tablet Take 2 tablets by mouth daily.    . calcium acetate (PHOSLO) 667 MG capsule Take 1 capsule (667 mg total) by mouth 2 (two) times daily. (Patient not taking: Reported on 07/20/2016) 60 capsule 5   No current facility-administered medications on file prior to visit.      Objective:  Objective  Physical Exam  Constitutional: She is oriented to person, place, and time. She appears well-developed and well-nourished.  HENT:  Head: Normocephalic and atraumatic.  Eyes: Conjunctivae and EOM are normal.  Neck: Normal range of motion. Neck supple. No JVD present. Carotid bruit is not present. No thyromegaly present.  Cardiovascular: Normal rate and regular rhythm.   Murmur heard. Pulmonary/Chest: Effort normal and breath sounds  normal. No respiratory distress. She has no wheezes. She has no rales. She exhibits no tenderness.  Musculoskeletal: She exhibits no edema.  Neurological: She is alert and oriented to person, place, and time.  Psychiatric: She has a normal mood and affect.  Nursing note and vitals reviewed.  BP (!) 140/38 (BP Location: Left Arm, Cuff Size: Normal)   Pulse 71   Temp 98.3 F (36.8 C) (Oral)   Resp 16   Ht 5\' 5"  (1.651 m)   Wt 138 lb 6.4 oz (62.8 kg)    SpO2 98%   BMI 23.03 kg/m  Wt Readings from Last 3 Encounters:  12/08/16 138 lb 6.4 oz (62.8 kg)  11/27/16 139 lb 4.8 oz (63.2 kg)  07/20/16 140 lb (63.5 kg)     Lab Results  Component Value Date   WBC 5.3 11/25/2016   HGB 11.9 (L) 11/25/2016   HCT 35.0 (L) 11/25/2016   PLT 146 (L) 11/25/2016   GLUCOSE 91 11/27/2016   CHOL 188 07/21/2015   TRIG 123.0 07/21/2015   HDL 48.50 07/21/2015   LDLDIRECT 196.2 04/26/2013   LDLCALC 115 (H) 07/21/2015   ALT 31 11/25/2016   AST 53 (H) 11/25/2016   NA 133 (L) 11/27/2016   K 3.2 (L) 11/27/2016   CL 95 (L) 11/27/2016   CREATININE 4.27 (H) 11/27/2016   BUN 24 (H) 11/27/2016   CO2 28 11/27/2016   TSH 3.140 05/17/2014   INR 1.05 07/20/2016   HGBA1C 4.7 (L) 11/26/2016   MICROALBUR 174.7 (H) 01/03/2008    Dg Chest Port 1 View  Result Date: 11/25/2016 CLINICAL DATA:  Severe shortness of breath and diarrhea. EXAM: PORTABLE CHEST 1 VIEW COMPARISON:  07/20/2016; 05/27/2014. FINDINGS: Grossly unchanged borderline enlarged cardiac silhouette. Atherosclerotic plaque when the thoracic aorta. Pulmonary vasculature appears indistinct with cephalization of flow. Perihilar heterogeneous opacities, right greater than left. Suspected trace pleural effusions. No pneumothorax. No acute osseus abnormalities. A vascular stent overlies the medial aspect of the left upper arm. IMPRESSION: 1. Findings most worrisome for pulmonary edema and trace bilateral effusions, though note, developing infection not excluded. Continued attention on follow-up is recommended. 2.  Aortic Atherosclerosis (ICD10-170.0) Electronically Signed   By: Sandi Mariscal M.D.   On: 11/25/2016 16:12     Assessment & Plan:  Plan  I am having Ms. Herrmann maintain her calcium acetate, amLODipine, acetaminophen, multivitamin with minerals, carvedilol, isosorbide mononitrate, furosemide, and lisinopril.  No orders of the defined types were placed in this encounter.   Problem List Items  Addressed This Visit      Unprioritized   End stage renal disease [facility])    Per nephrology      Essential hypertension    Stable con't meds      Hyperlipidemia LDL goal <70    con't meds Check labs       Other Visit Diagnoses    Diastolic dysfunction    -  Primary   Relevant Orders   Comprehensive metabolic panel   CBC with Differential/Platelet   Ambulatory referral to Cardiology    pt is tranferring to [facility] office because it is closer for the patient and her husband goes there.  Follow-up: Return in about 6 months (around 06/07/2017) for hypertension, hyperlipidemia.  Ann Held, DO

## 2016-12-11 ENCOUNTER — Other Ambulatory Visit: Payer: Self-pay | Admitting: Family Medicine

## 2016-12-12 DIAGNOSIS — N2581 Secondary hyperparathyroidism of renal origin: Secondary | ICD-10-CM | POA: Diagnosis not present

## 2016-12-12 DIAGNOSIS — N186 End stage renal disease: Secondary | ICD-10-CM | POA: Diagnosis not present

## 2016-12-12 NOTE — Telephone Encounter (Signed)
Left vm to schedule appt

## 2016-12-14 ENCOUNTER — Ambulatory Visit: Payer: Medicare HMO | Admitting: Physician Assistant

## 2016-12-14 DIAGNOSIS — N186 End stage renal disease: Secondary | ICD-10-CM | POA: Diagnosis not present

## 2016-12-14 DIAGNOSIS — N2581 Secondary hyperparathyroidism of renal origin: Secondary | ICD-10-CM | POA: Diagnosis not present

## 2016-12-15 ENCOUNTER — Inpatient Hospital Stay: Payer: Medicare HMO | Admitting: Family Medicine

## 2016-12-15 ENCOUNTER — Encounter: Payer: Self-pay | Admitting: Internal Medicine

## 2016-12-16 DIAGNOSIS — N2581 Secondary hyperparathyroidism of renal origin: Secondary | ICD-10-CM | POA: Diagnosis not present

## 2016-12-16 DIAGNOSIS — N186 End stage renal disease: Secondary | ICD-10-CM | POA: Diagnosis not present

## 2016-12-18 ENCOUNTER — Other Ambulatory Visit: Payer: Self-pay | Admitting: Family Medicine

## 2016-12-18 DIAGNOSIS — N184 Chronic kidney disease, stage 4 (severe): Secondary | ICD-10-CM

## 2016-12-19 DIAGNOSIS — N2581 Secondary hyperparathyroidism of renal origin: Secondary | ICD-10-CM | POA: Diagnosis not present

## 2016-12-19 DIAGNOSIS — N186 End stage renal disease: Secondary | ICD-10-CM | POA: Diagnosis not present

## 2016-12-20 ENCOUNTER — Ambulatory Visit (INDEPENDENT_AMBULATORY_CARE_PROVIDER_SITE_OTHER): Payer: Medicare HMO | Admitting: Family Medicine

## 2016-12-20 ENCOUNTER — Encounter: Payer: Self-pay | Admitting: Family Medicine

## 2016-12-20 VITALS — BP 146/56 | HR 70 | Temp 99.3°F | Resp 16 | Ht 65.0 in | Wt 137.6 lb

## 2016-12-20 DIAGNOSIS — M79645 Pain in left finger(s): Secondary | ICD-10-CM

## 2016-12-20 DIAGNOSIS — M65312 Trigger thumb, left thumb: Secondary | ICD-10-CM | POA: Diagnosis not present

## 2016-12-20 NOTE — Patient Instructions (Addendum)
Call dialysis place to get them to send over the dates that you had your pneumonia shot and flu shot.   Trigger Finger Trigger finger (digital tendinitis and stenosing tenosynovitis) is a common disorder that causes an often painful catching of the fingers or thumb. It occurs as a clicking, snapping, or locking of a finger in the palm of the hand. This is caused by a problem with the tendons that flex or bend the fingers sliding smoothly through their sheaths. The condition may occur in any finger or a couple fingers at the same time.  The finger may lock with the finger curled or suddenly straighten out with a snap. This is more common in patients with rheumatoid arthritis and diabetes. Left untreated, the condition may get worse to the point where the finger becomes locked in flexion, like making a fist, or less commonly locked with the finger straightened out. CAUSES   Inflammation and scarring that lead to swelling around the tendon sheath.  Repeated or forceful movements.  Rheumatoid arthritis, an autoimmune disease that affects joints.  Gout.  Diabetes mellitus. SIGNS AND SYMPTOMS  Soreness and swelling of your finger.  A painful clicking or snapping as you bend and straighten your finger. DIAGNOSIS  Your health care provider will do a physical exam of your finger to diagnose trigger finger. TREATMENT   Splinting for 6-8 weeks may be helpful.  Nonsteroidal anti-inflammatory medicines (NSAIDs) can help to relieve the pain and inflammation.  Cortisone injections, along with splinting, may speed up recovery. Several injections may be required. Cortisone may give relief after one injection.  Surgery is another treatment that may be used if conservative treatments do not work. Surgery can be minor, without incisions (a cut does not have to be made), and can be done with a needle through the skin.  Other surgical choices involve an open procedure in which the surgeon opens the hand  through a small incision and cuts the pulley so the tendon can again slide smoothly. Your hand will still work fine. HOME CARE INSTRUCTIONS  Apply ice to the injured area, twice per day:  Put ice in a plastic bag.  Place a towel between your skin and the bag.  Leave the ice on for 20 minutes, 3-4 times a day.  Rest your hand often. MAKE SURE YOU:   Understand these instructions.  Will watch your condition.  Will get help right away if you are not doing well or get worse. This information is not intended to replace advice given to you by your health care provider. Make sure you discuss any questions you have with your health care provider. Document Released: 08/06/2004 Document Revised: 06/19/2013 Document Reviewed: 03/19/2013 Elsevier Interactive Patient Education  2017 [employer].

## 2016-12-20 NOTE — Progress Notes (Signed)
Pre visit review using our clinic review tool, if applicable. No additional management support is needed unless otherwise documented below in the visit note. 

## 2016-12-20 NOTE — Patient Instructions (Signed)
You have a developing trigger finger. I would either tape this joint of the thumb (with band-aid or coban) regularly for next 6 weeks or consider thumb spica brace. Heat if needed. Consider injection if this becomes more constant or worsens. Follow up with me as needed.

## 2016-12-20 NOTE — Progress Notes (Signed)
Subjective:  I acted as a Education administrator for Dr. Carollee Herter.  [name] [name], [name] [name]   Patient ID: [name] [name], female    DOB: 12/08/1940, 76 y.o.   MRN: [id number]  Chief Complaint  Patient presents with  . RIGHT HAND LOCKED UP    about a week ago    HPI Patient is in today for right hand locked up at the thumb.  This happened about a week ago. When it happens it just feels tight.  No pain.  Past Medical History:  Diagnosis Date  . DEPRESSION   . DIABETES MELLITUS, TYPE I   . GERD   . GLAUCOMA   . Headache(784.0)   . Hepatitis B carrier ([place])    05/2009: neg Hep C; Hep B: core pos, Surf neg; fatty liver US 8/10 - 7/13  . HYPERLIPIDEMIA   . HYPERTENSION   . Kidney failure    dialysis 3 times per week  . OSTEOPENIA   . POSTMENOPAUSAL STATUS   . Unspecified vitamin D deficiency     Past Surgical History:  Procedure Laterality Date  . AV FISTULA PLACEMENT Left 05/29/2014   Procedure: INSERTION OF ARTERIOVENOUS (AV) GORE-TEX GRAFT ARM-LEFT;  Surgeon: Angelia Mould, MD;  Location: [place];  Service: Vascular;  Laterality: Left;  . CHOLECYSTECTOMY  02/12/07  . INSERTION OF DIALYSIS CATHETER Right 05/27/2014   Procedure: INSERTION OF DIALYSIS CATHETER;  Surgeon: Angelia Mould, MD;  Location: [place];  Service: Vascular;  Laterality: Right;  . REFRACTIVE SURGERY  07/29/09   Dr. Bing Plume  . TONSILLECTOMY      Family History  Problem Relation Age of Onset  . Coronary artery disease Other   . Diabetes Other   . Hypertension Other   . Arthritis Other     Social History   Social History  . Marital status: Married    Spouse name: N/A  . Number of children: N/A  . Years of education: N/A   Occupational History  . Not on file.   Social History Main Topics  . Smoking status: Never Smoker  . Smokeless tobacco: Never Used     Comment: Married x's 51yrs, 6 kids-scattered [employer]. Retired Consulting civil engineer  . Alcohol use No  . Drug use: No  . Sexual activity: Not on file    Other Topics Concern  . Not on file   Social History Narrative  . No narrative on file    Outpatient Medications Prior to Visit  Medication Sig Dispense Refill  . acetaminophen (TYLENOL) 325 MG tablet Take 325 mg by mouth every 6 (six) hours as needed for moderate pain.    Marland Kitchen amLODipine (NORVASC) 10 MG tablet TAKE 1 TABLET BY MOUTH EVERY DAY (Patient taking differently: TAKE 10 mg BY MOUTH EVERY DAY) 90 tablet 3  . calcium acetate (PHOSLO) 667 MG capsule Take 1 capsule (667 mg total) by mouth 2 (two) times daily. 60 capsule 5  . carvedilol (COREG) 25 MG tablet TAKE 1 TABLET (25 MG TOTAL) BY MOUTH 2 (TWO) TIMES DAILY WITH A MEAL. 60 tablet 3  . furosemide (LASIX) 40 MG tablet TAKE 1 TABLET BY MOUTH EVERY DAY 30 tablet 0  . isosorbide mononitrate (IMDUR) 30 MG 24 hr tablet TAKE 1 TABLET BY MOUTH TWICE A DAY 60 tablet 0  . lisinopril (PRINIVIL,ZESTRIL) 40 MG tablet Take 40 mg by mouth at bedtime.  10  . Multiple Vitamin (MULTIVITAMIN WITH MINERALS) TABS tablet Take 2 tablets by mouth daily.  No facility-administered medications prior to visit.     Allergies  Allergen Reactions  . Hydralazine Itching  . Robaxin [Methocarbamol]     Dizziness     Review of Systems  Constitutional: Negative for chills, fever and malaise/fatigue.  HENT: Negative for congestion and hearing loss.   Eyes: Negative for discharge.  Respiratory: Negative for cough, sputum production and shortness of breath.   Cardiovascular: Negative for chest pain, palpitations and leg swelling.  Gastrointestinal: Negative for abdominal pain, blood in stool, constipation, diarrhea, heartburn, nausea and vomiting.  Genitourinary: Negative for dysuria, frequency, hematuria and urgency.  Musculoskeletal:       R hand pain ---  Thumb gets stuck and locks up -- occurs frequently  Skin: Negative for rash.  Neurological: Negative for dizziness, sensory change, loss of consciousness, weakness and headaches.   Endo/Heme/Allergies: Negative for environmental allergies. Does not bruise/bleed easily.  Psychiatric/Behavioral: Negative for depression and suicidal ideas. The patient is not nervous/anxious and does not have insomnia.        Objective:    Physical Exam  Constitutional: She appears well-developed and well-nourished.  Musculoskeletal: Normal range of motion. She exhibits no edema, tenderness or deformity.  No pain today  Nursing note and vitals reviewed.   BP (!) 146/56 (BP Location: Right Arm, Cuff Size: Normal)   Pulse 70   Temp 99.3 F (37.4 C) (Oral)   Resp 16   Ht 5\' 5"  (1.651 m)   Wt 137 lb 9.6 oz (62.4 kg)   SpO2 98%   BMI 22.90 kg/m  Wt Readings from Last 3 Encounters:  12/20/16 137 lb (62.1 kg)  12/20/16 137 lb 9.6 oz (62.4 kg)  12/08/16 138 lb 6.4 oz (62.8 kg)     Lab Results  Component Value Date   WBC 5.3 11/25/2016   HGB 11.9 (L) 11/25/2016   HCT 35.0 (L) 11/25/2016   PLT 146 (L) 11/25/2016   GLUCOSE 91 11/27/2016   CHOL 188 07/21/2015   TRIG 123.0 07/21/2015   HDL 48.50 07/21/2015   LDLDIRECT 196.2 04/26/2013   LDLCALC 115 (H) 07/21/2015   ALT 31 11/25/2016   AST 53 (H) 11/25/2016   NA 133 (L) 11/27/2016   K 3.2 (L) 11/27/2016   CL 95 (L) 11/27/2016   CREATININE 4.27 (H) 11/27/2016   BUN 24 (H) 11/27/2016   CO2 28 11/27/2016   TSH 3.140 05/17/2014   INR 1.05 07/20/2016   HGBA1C 4.7 (L) 11/26/2016   MICROALBUR 174.7 (H) 01/03/2008    Lab Results  Component Value Date   TSH 3.140 05/17/2014   Lab Results  Component Value Date   WBC 5.3 11/25/2016   HGB 11.9 (L) 11/25/2016   HCT 35.0 (L) 11/25/2016   MCV 91.2 11/25/2016   PLT 146 (L) 11/25/2016   Lab Results  Component Value Date   NA 133 (L) 11/27/2016   K 3.2 (L) 11/27/2016   CO2 28 11/27/2016   GLUCOSE 91 11/27/2016   BUN 24 (H) 11/27/2016   CREATININE 4.27 (H) 11/27/2016   BILITOT 0.5 11/25/2016   ALKPHOS 153 (H) 11/25/2016   AST 53 (H) 11/25/2016   ALT 31 11/25/2016    PROT 7.6 11/25/2016   ALBUMIN 2.6 (L) 11/27/2016   CALCIUM 7.8 (L) 11/27/2016   ANIONGAP 10 11/27/2016   GFR 8.45 (LL) 12/22/2014   Lab Results  Component Value Date   CHOL 188 07/21/2015   Lab Results  Component Value Date   HDL 48.50 07/21/2015   Lab  Results  Component Value Date   LDLCALC 115 (H) 07/21/2015   Lab Results  Component Value Date   TRIG 123.0 07/21/2015   Lab Results  Component Value Date   CHOLHDL 4 07/21/2015   Lab Results  Component Value Date   HGBA1C 4.7 (L) 11/26/2016       Assessment & Plan:   Problem List Items Addressed This Visit    None    Visit Diagnoses    Trigger finger of left thumb    -  Primary   Relevant Orders   Ambulatory referral to Sports Medicine      I am having Ms. Whisonant maintain her calcium acetate, amLODipine, acetaminophen, multivitamin with minerals, carvedilol, lisinopril, furosemide, and isosorbide mononitrate.  No orders of the defined types were placed in this encounter.   CMA served as Education administrator during this visit. History, Physical and Plan performed by medical provider. Documentation and orders reviewed and attested to.  Ann Held, DO

## 2016-12-21 DIAGNOSIS — N2581 Secondary hyperparathyroidism of renal origin: Secondary | ICD-10-CM | POA: Diagnosis not present

## 2016-12-21 DIAGNOSIS — N186 End stage renal disease: Secondary | ICD-10-CM | POA: Diagnosis not present

## 2016-12-22 DIAGNOSIS — M79645 Pain in left finger(s): Secondary | ICD-10-CM | POA: Insufficient documentation

## 2016-12-22 NOTE — Assessment & Plan Note (Signed)
early developing trigger finger.  Encouraged to start with bracing which was demonstrated today.  Heat, tylenol if needed.  She will consider trigger finger injection if this worsens or becomes more constant.  Otherwise follow up as needed.

## 2016-12-22 NOTE — Progress Notes (Signed)
PCP and consultation requested by: Ann Held, DO  Subjective:   HPI: Patient is a 76 y.o. female here for left thumb pain.  Patient reports she's had about 2 weeks of left thumb pain. No known injury or trauma. Has history of trigger finger years ago. Reports feels like thumb will catch or lock up at times. Pain currently 0/10 level but can be sharp. Worse with motion, first thing in the morning. No skin changes, numbness.  Past Medical History:  Diagnosis Date  . DEPRESSION   . DIABETES MELLITUS, TYPE I   . GERD   . GLAUCOMA   . Headache(784.0)   . Hepatitis B carrier ([place])    05/2009: neg Hep C; Hep B: core pos, Surf neg; fatty liver US 8/10 - 7/13  . HYPERLIPIDEMIA   . HYPERTENSION   . Kidney failure    dialysis 3 times per week  . OSTEOPENIA   . POSTMENOPAUSAL STATUS   . Unspecified vitamin D deficiency     Current Outpatient Prescriptions on File Prior to Visit  Medication Sig Dispense Refill  . acetaminophen (TYLENOL) 325 MG tablet Take 325 mg by mouth every 6 (six) hours as needed for moderate pain.    Marland Kitchen amLODipine (NORVASC) 10 MG tablet TAKE 1 TABLET BY MOUTH EVERY DAY (Patient taking differently: TAKE 10 mg BY MOUTH EVERY DAY) 90 tablet 3  . calcium acetate (PHOSLO) 667 MG capsule Take 1 capsule (667 mg total) by mouth 2 (two) times daily. 60 capsule 5  . carvedilol (COREG) 25 MG tablet TAKE 1 TABLET (25 MG TOTAL) BY MOUTH 2 (TWO) TIMES DAILY WITH A MEAL. 60 tablet 3  . furosemide (LASIX) 40 MG tablet TAKE 1 TABLET BY MOUTH EVERY DAY 30 tablet 0  . isosorbide mononitrate (IMDUR) 30 MG 24 hr tablet TAKE 1 TABLET BY MOUTH TWICE A DAY 60 tablet 0  . lisinopril (PRINIVIL,ZESTRIL) 40 MG tablet Take 40 mg by mouth at bedtime.  10  . Multiple Vitamin (MULTIVITAMIN WITH MINERALS) TABS tablet Take 2 tablets by mouth daily.     No current facility-administered medications on file prior to visit.     Past Surgical History:  Procedure Laterality Date  . AV  FISTULA PLACEMENT Left 05/29/2014   Procedure: INSERTION OF ARTERIOVENOUS (AV) GORE-TEX GRAFT ARM-LEFT;  Surgeon: Angelia Mould, MD;  Location: [place];  Service: Vascular;  Laterality: Left;  . CHOLECYSTECTOMY  02/12/07  . INSERTION OF DIALYSIS CATHETER Right 05/27/2014   Procedure: INSERTION OF DIALYSIS CATHETER;  Surgeon: Angelia Mould, MD;  Location: [place];  Service: Vascular;  Laterality: Right;  . REFRACTIVE SURGERY  07/29/09   Dr. Bing Plume  . TONSILLECTOMY      Allergies  Allergen Reactions  . Hydralazine Itching  . Robaxin [Methocarbamol]     Dizziness     Social History   Social History  . Marital status: Married    Spouse name: N/A  . Number of children: N/A  . Years of education: N/A   Occupational History  . Not on file.   Social History Main Topics  . Smoking status: Never Smoker  . Smokeless tobacco: Never Used     Comment: Married x's 10yrs, 6 kids-scattered [employer]. Retired Consulting civil engineer  . Alcohol use No  . Drug use: No  . Sexual activity: Not on file   Other Topics Concern  . Not on file   Social History Narrative  . No narrative on file    Family  History  Problem Relation Age of Onset  . Coronary artery disease Other   . Diabetes Other   . Hypertension Other   . Arthritis Other     BP (!) 173/62   Pulse 63   Ht 5\' 5"  (1.651 m)   Wt 137 lb (62.1 kg)   BMI 22.80 kg/m   Review of Systems: See HPI above.     Objective:  Physical Exam:  Gen: NAD, comfortable in exam room  Left thumb: No gross deformity, swelling, bruising.  No locking reproduced. No TTP currently at A1 pulley, 1st dorsal compartment, through thumb, 1st CMC, carpal tunnel. FROM at IP, MCP, CMC joints. Negative tinels, finkelsteins. NVI distally.  Right thumb: FROM without pain.   Assessment & Plan:  1. Left thumb pain - early developing trigger finger.  Encouraged to start with bracing which was demonstrated today.  Heat, tylenol if needed.  She will  consider trigger finger injection if this worsens or becomes more constant.  Otherwise follow up as needed.

## 2016-12-23 DIAGNOSIS — N2581 Secondary hyperparathyroidism of renal origin: Secondary | ICD-10-CM | POA: Diagnosis not present

## 2016-12-23 DIAGNOSIS — N186 End stage renal disease: Secondary | ICD-10-CM | POA: Diagnosis not present

## 2016-12-26 DIAGNOSIS — N186 End stage renal disease: Secondary | ICD-10-CM | POA: Diagnosis not present

## 2016-12-26 DIAGNOSIS — N2581 Secondary hyperparathyroidism of renal origin: Secondary | ICD-10-CM | POA: Diagnosis not present

## 2016-12-28 DIAGNOSIS — E1122 Type 2 diabetes mellitus with diabetic chronic kidney disease: Secondary | ICD-10-CM | POA: Diagnosis not present

## 2016-12-28 DIAGNOSIS — Z992 Dependence on renal dialysis: Secondary | ICD-10-CM | POA: Diagnosis not present

## 2016-12-28 DIAGNOSIS — N2581 Secondary hyperparathyroidism of renal origin: Secondary | ICD-10-CM | POA: Diagnosis not present

## 2016-12-28 DIAGNOSIS — N186 End stage renal disease: Secondary | ICD-10-CM | POA: Diagnosis not present

## 2016-12-30 DIAGNOSIS — N186 End stage renal disease: Secondary | ICD-10-CM | POA: Diagnosis not present

## 2016-12-30 DIAGNOSIS — N2581 Secondary hyperparathyroidism of renal origin: Secondary | ICD-10-CM | POA: Diagnosis not present

## 2017-01-02 DIAGNOSIS — N186 End stage renal disease: Secondary | ICD-10-CM | POA: Diagnosis not present

## 2017-01-02 DIAGNOSIS — N2581 Secondary hyperparathyroidism of renal origin: Secondary | ICD-10-CM | POA: Diagnosis not present

## 2017-01-04 DIAGNOSIS — N2581 Secondary hyperparathyroidism of renal origin: Secondary | ICD-10-CM | POA: Diagnosis not present

## 2017-01-04 DIAGNOSIS — N186 End stage renal disease: Secondary | ICD-10-CM | POA: Diagnosis not present

## 2017-01-06 DIAGNOSIS — N186 End stage renal disease: Secondary | ICD-10-CM | POA: Diagnosis not present

## 2017-01-06 DIAGNOSIS — N2581 Secondary hyperparathyroidism of renal origin: Secondary | ICD-10-CM | POA: Diagnosis not present

## 2017-01-09 ENCOUNTER — Other Ambulatory Visit: Payer: Self-pay | Admitting: Family Medicine

## 2017-01-09 DIAGNOSIS — I10 Essential (primary) hypertension: Secondary | ICD-10-CM

## 2017-01-09 DIAGNOSIS — N186 End stage renal disease: Secondary | ICD-10-CM | POA: Diagnosis not present

## 2017-01-09 DIAGNOSIS — N2581 Secondary hyperparathyroidism of renal origin: Secondary | ICD-10-CM | POA: Diagnosis not present

## 2017-01-10 NOTE — Telephone Encounter (Signed)
Patient states daughter convince her to remain with Dr. Etter Sjogren and patient daughter would provide transportation for patient to attend appointments.

## 2017-01-10 NOTE — Telephone Encounter (Signed)
noted 

## 2017-01-11 DIAGNOSIS — N2581 Secondary hyperparathyroidism of renal origin: Secondary | ICD-10-CM | POA: Diagnosis not present

## 2017-01-11 DIAGNOSIS — N186 End stage renal disease: Secondary | ICD-10-CM | POA: Diagnosis not present

## 2017-01-11 NOTE — Progress Notes (Signed)
Cardiology Office Note    Date:  01/17/2017   ID:  [name] [name], DOB 06/28/1941, MRN [id number]  PCP:  Ann Held, DO  Cardiologist:  New to Dr. Johnsie Cancel   Chief Complaint: Diastolic dysfunction   History of Present Illness:   [name] [name] is a 76 y.o. female DM, HTN, HLD, ESRD on HD presents for evaluation of diastolic dysfunction on echo.   She was admitted 11/25/16-11/27/16 for acute respiratory failure with hypoxia in setting of missed dialysis and malignant hypertension. Required IV nitro. BP improved with HD. Echo 11/27/16 showed LV function of 55-60%, grade 2 DD, mild to moderate AS, mild dilated LA, mild reduced RV function and PA pressure of 31 mm HG. Refereed for further evaluation.   Patient is on HD for the past 3 years. 4 episode of syncope during dialysis. Last episode few years ago. Lasted for few second. Unable to specify reason however thinks due to low BP. The patient denies nausea, vomiting, fever, chest pain, palpitations, shortness of breath, orthopnea, PND, dizziness, cough, congestion, abdominal pain, hematochezia, melena, lower extremity edema. Hx of heart murmur since childhood. Non smoker.   Past Medical History:  Diagnosis Date  . DEPRESSION   . DIABETES MELLITUS, TYPE I   . GERD   . GLAUCOMA   . Headache(784.0)   . Hepatitis B carrier ([place])    05/2009: neg Hep C; Hep B: core pos, Surf neg; fatty liver US 8/10 - 7/13  . HYPERLIPIDEMIA   . HYPERTENSION   . Kidney failure    dialysis 3 times per week  . OSTEOPENIA   . POSTMENOPAUSAL STATUS   . Unspecified vitamin D deficiency     Past Surgical History:  Procedure Laterality Date  . AV FISTULA PLACEMENT Left 05/29/2014   Procedure: INSERTION OF ARTERIOVENOUS (AV) GORE-TEX GRAFT ARM-LEFT;  Surgeon: Angelia Mould, MD;  Location: [facility];  Service: Vascular;  Laterality: Left;  . CHOLECYSTECTOMY  02/12/07  . INSERTION OF DIALYSIS CATHETER Right 05/27/2014   Procedure: INSERTION OF  DIALYSIS CATHETER;  Surgeon: Angelia Mould, MD;  Location: [facility];  Service: Vascular;  Laterality: Right;  . REFRACTIVE SURGERY  07/29/09   Dr. Bing Plume  . TONSILLECTOMY      Current Medications: Prior to Admission medications   Medication Sig Start Date End Date Taking? Authorizing Provider  acetaminophen (TYLENOL) 325 MG tablet Take 325 mg by mouth every 6 (six) hours as needed for moderate pain.    Historical Provider, MD  amLODipine (NORVASC) 10 MG tablet TAKE 1 TABLET BY MOUTH EVERY DAY Patient taking differently: TAKE 10 mg BY MOUTH EVERY DAY 12/30/15   Rosalita Chessman Chase, DO  calcium acetate (PHOSLO) 667 MG capsule Take 1 capsule (667 mg total) by mouth 2 (two) times daily. 07/22/15   Alferd Apa Lowne Chase, DO  carvedilol (COREG) 25 MG tablet TAKE 1 TABLET (25 MG TOTAL) BY MOUTH 2 (TWO) TIMES DAILY WITH A MEAL. 01/09/17   Rosalita Chessman Chase, DO  furosemide (LASIX) 40 MG tablet TAKE 1 TABLET BY MOUTH EVERY DAY 12/12/16   Rosalita Chessman Chase, DO  isosorbide mononitrate (IMDUR) 30 MG 24 hr tablet TAKE 1 TABLET BY MOUTH TWICE A DAY 12/19/16   Alferd Apa Lowne Chase, DO  lisinopril (PRINIVIL,ZESTRIL) 40 MG tablet Take 40 mg by mouth at bedtime. 09/20/16   Historical Provider, MD  Multiple Vitamin (MULTIVITAMIN WITH MINERALS) TABS tablet Take 2 tablets by mouth daily.  Historical Provider, MD    Allergies:   Hydralazine and Robaxin [methocarbamol]   Social History   Social History  . Marital status: Married    Spouse name: N/A  . Number of children: N/A  . Years of education: N/A   Social History Main Topics  . Smoking status: Never Smoker  . Smokeless tobacco: Never Used     Comment: Married x's 42yrs, 6 kids-scattered [employer]. Retired Consulting civil engineer  . Alcohol use No  . Drug use: No  . Sexual activity: Not Asked   Other Topics Concern  . None   Social History Narrative  . None     Family History:  The patient's family history includes Arthritis in her other; Coronary  artery disease in her other; Diabetes in her other; Hypertension in her other.   ROS:   Please see the history of present illness.    ROS All other systems reviewed and are negative.   PHYSICAL EXAM:   VS:  BP 130/60   Pulse 69   Ht 5\' 5"  (1.651 m)   Wt 137 lb (62.1 kg)   BMI 22.80 kg/m    GEN: Well nourished, well developed, in no acute distress  HEENT: normal  Neck: no JVD, carotid bruits, or masses Cardiac: RRR;  1-6/1 systolic  murmurs, rubs, or gallops,no edema  Respiratory:  clear to auscultation bilaterally, normal work of breathing GI: soft, nontender, nondistended, + BS MS: no deformity or atrophy  Skin: warm and dry, no rash Neuro:  Alert and Oriented x 3, Strength and sensation are intact Psych: euthymic mood, full affect  Wt Readings from Last 3 Encounters:  01/17/17 137 lb (62.1 kg)  12/20/16 137 lb (62.1 kg)  12/20/16 137 lb 9.6 oz (62.4 kg)      Studies/Labs Reviewed:   EKG:  EKG is not ordered today.  EKG 11/25/16 NSR at rate of 91 bpm. LVH.   Recent Labs: 11/25/2016: ALT 31; Hemoglobin 11.9; Platelets 146 11/27/2016: BUN 24; Creatinine, Ser 4.27; Potassium 3.2; Sodium 133   Lipid Panel    Component Value Date/Time   CHOL 188 07/21/2015 1548   TRIG 123.0 07/21/2015 1548   HDL 48.50 07/21/2015 1548   CHOLHDL 4 07/21/2015 1548   VLDL 24.6 07/21/2015 1548   LDLCALC 115 (H) 07/21/2015 1548   LDLDIRECT 196.2 04/26/2013 1152    Additional studies/ records that were reviewed today include:   Echocardiogram: 11/27/16 LV EF: 55% -   60%  ------------------------------------------------------------------- Indications:      CHF - 428.0.  ------------------------------------------------------------------- History:   PMH:  CKD. ESRD.  PMH:   Myocardial infarction.  Risk factors:  Hypertension. Diabetes mellitus. Dyslipidemia.  ------------------------------------------------------------------- Study Conclusions  - Left ventricle: The cavity size  was normal. Systolic function was   normal. The estimated ejection fraction was in the range of 55%   to 60%. Wall motion was normal; there were no regional wall   motion abnormalities. Features are consistent with a pseudonormal   left ventricular filling pattern, with concomitant abnormal   relaxation and increased filling pressure (grade 2 diastolic   dysfunction). Doppler parameters are consistent with high   ventricular filling pressure. Moderate concentric and severe   focal basal septal hypertrophy. - Aortic valve: Moderately calcified annulus. Trileaflet; mildly   thickened, moderately calcified leaflets. There was mild to   moderate stenosis. Peak velocity (S): 204 cm/s. Mean gradient   (S): 9 mm Hg. Valve area (VTI): 1.28 cm^2. Valve area (Vmax):   1.32  cm^2. Valve area (Vmean): 1.31 cm^2. - Left atrium: The atrium was mildly dilated. - Right ventricle: Systolic function was mildly reduced. - Atrial septum: No defect or patent foramen ovale was identified. - Tricuspid valve: There was mild regurgitation. - Pulmonary arteries: PA peak pressure: 31 mm Hg (S).   ASSESSMENT & PLAN:    1. Chronic diastolic CHF - She is euvolemic. No dyspnea, orthopnea or PND. Volume management by HD and  lasix.   2. Mild to moderate AS - Hx of heart murmur since childhood. No stenosis on echo in 2015. Echo 10/2016 as above. Will follow with yearly echo.   3. Syncope - All episode occurred during HD. Likely due to HD  4. ESRD on HD (MWF) - Goes to [facility]. Due to DM.   5. HTN - Well controlled on current medications.   Plan discussed with DOD Dr. Johnsie Cancel. Will get Lexiscan Myoview given cardiac risk factors of HTN, ESRD and DM. If normal myoview follow up with Dr. Johnsie Cancel in one year with echo for AS.   Medication Adjustments/Labs and Tests Ordered: Current medicines are reviewed at length with the patient today.  Concerns regarding medicines are outlined above.  Medication  changes, Labs and Tests ordered today are listed in the Patient Instructions below. Patient Instructions  Medication Instructions:   Your physician recommends that you continue on your current medications as directed. Please refer to the Current Medication list given to you today.   If you need a refill on your cardiac medications before your next appointment, please call your pharmacy.  Labwork: NONE ORDERED  TODAY    Testing/Procedures: Your physician has requested that you have a lexiscan myoview. For further information please visit [email or web address]. Please follow instruction sheet, as given.    Follow-Up: Your physician wants you to follow-up in: [place] will receive a reminder letter in the mail two months in advance. If you don't receive a letter, please call our office to schedule the follow-up appointment.     Any Other Special Instructions Will Be Listed Below (If Applicable).                                                                                                                                                      Jarrett Soho, [place]  01/17/2017 9:59 AM    [address], [place], [place]  [address] Phone: [phone]; Fax: [phone]

## 2017-01-13 ENCOUNTER — Other Ambulatory Visit: Payer: Self-pay | Admitting: Family Medicine

## 2017-01-13 DIAGNOSIS — N186 End stage renal disease: Secondary | ICD-10-CM | POA: Diagnosis not present

## 2017-01-13 DIAGNOSIS — N2581 Secondary hyperparathyroidism of renal origin: Secondary | ICD-10-CM | POA: Diagnosis not present

## 2017-01-16 DIAGNOSIS — N186 End stage renal disease: Secondary | ICD-10-CM | POA: Diagnosis not present

## 2017-01-16 DIAGNOSIS — N2581 Secondary hyperparathyroidism of renal origin: Secondary | ICD-10-CM | POA: Diagnosis not present

## 2017-01-17 ENCOUNTER — Encounter: Payer: Self-pay | Admitting: Physician Assistant

## 2017-01-17 ENCOUNTER — Telehealth (HOSPITAL_COMMUNITY): Payer: Self-pay | Admitting: *Deleted

## 2017-01-17 ENCOUNTER — Ambulatory Visit (INDEPENDENT_AMBULATORY_CARE_PROVIDER_SITE_OTHER): Payer: Medicare HMO | Admitting: Physician Assistant

## 2017-01-17 ENCOUNTER — Encounter (INDEPENDENT_AMBULATORY_CARE_PROVIDER_SITE_OTHER): Payer: Self-pay

## 2017-01-17 VITALS — BP 130/60 | HR 69 | Ht 65.0 in | Wt 137.0 lb

## 2017-01-17 DIAGNOSIS — I10 Essential (primary) hypertension: Secondary | ICD-10-CM

## 2017-01-17 DIAGNOSIS — N186 End stage renal disease: Secondary | ICD-10-CM

## 2017-01-17 DIAGNOSIS — Z9189 Other specified personal risk factors, not elsewhere classified: Secondary | ICD-10-CM

## 2017-01-17 DIAGNOSIS — I350 Nonrheumatic aortic (valve) stenosis: Secondary | ICD-10-CM

## 2017-01-17 DIAGNOSIS — Z992 Dependence on renal dialysis: Secondary | ICD-10-CM

## 2017-01-17 DIAGNOSIS — E7800 Pure hypercholesterolemia, unspecified: Secondary | ICD-10-CM | POA: Diagnosis not present

## 2017-01-17 DIAGNOSIS — E118 Type 2 diabetes mellitus with unspecified complications: Secondary | ICD-10-CM | POA: Diagnosis not present

## 2017-01-17 NOTE — Telephone Encounter (Signed)
Patient given detailed instructions per Myocardial Perfusion Study Information Sheet for the test on 01/19/17 at 1000. Patient notified to arrive 15 minutes early and that it is imperative to arrive on time for appointment to keep from having the test rescheduled.  If you need to cancel or reschedule your appointment, please call the office within 24 hours of your appointment. Failure to do so may result in a cancellation of your appointment, and a $50 no show fee. Patient verbalized understanding.Fia Hebert, [place]e [place]

## 2017-01-17 NOTE — Patient Instructions (Addendum)
Medication Instructions:   Your physician recommends that you continue on your current medications as directed. Please refer to the Current Medication list given to you today.   If you need a refill on your cardiac medications before your next appointment, please call your pharmacy.  Labwork: NONE ORDERED  TODAY    Testing/Procedures: Your physician has requested that you have a lexiscan myoview. For further information please visit [email or web address]. Please follow instruction sheet, as given.    Follow-Up: Your physician wants you to follow-up in: [place] will receive a reminder letter in the mail two months in advance. If you don't receive a letter, please call our office to schedule the follow-up appointment.     Any Other Special Instructions Will Be Listed Below (If Applicable).

## 2017-01-18 DIAGNOSIS — N186 End stage renal disease: Secondary | ICD-10-CM | POA: Diagnosis not present

## 2017-01-18 DIAGNOSIS — N2581 Secondary hyperparathyroidism of renal origin: Secondary | ICD-10-CM | POA: Diagnosis not present

## 2017-01-19 ENCOUNTER — Ambulatory Visit: Payer: Medicare HMO | Admitting: Nurse Practitioner

## 2017-01-19 ENCOUNTER — Encounter (HOSPITAL_COMMUNITY): Payer: Medicare HMO

## 2017-01-20 DIAGNOSIS — N2581 Secondary hyperparathyroidism of renal origin: Secondary | ICD-10-CM | POA: Diagnosis not present

## 2017-01-20 DIAGNOSIS — N186 End stage renal disease: Secondary | ICD-10-CM | POA: Diagnosis not present

## 2017-01-23 DIAGNOSIS — N186 End stage renal disease: Secondary | ICD-10-CM | POA: Diagnosis not present

## 2017-01-23 DIAGNOSIS — N2581 Secondary hyperparathyroidism of renal origin: Secondary | ICD-10-CM | POA: Diagnosis not present

## 2017-01-25 DIAGNOSIS — N2581 Secondary hyperparathyroidism of renal origin: Secondary | ICD-10-CM | POA: Diagnosis not present

## 2017-01-25 DIAGNOSIS — N186 End stage renal disease: Secondary | ICD-10-CM | POA: Diagnosis not present

## 2017-01-27 DIAGNOSIS — N186 End stage renal disease: Secondary | ICD-10-CM | POA: Diagnosis not present

## 2017-01-27 DIAGNOSIS — N2581 Secondary hyperparathyroidism of renal origin: Secondary | ICD-10-CM | POA: Diagnosis not present

## 2017-01-28 DIAGNOSIS — E1122 Type 2 diabetes mellitus with diabetic chronic kidney disease: Secondary | ICD-10-CM | POA: Diagnosis not present

## 2017-01-28 DIAGNOSIS — Z992 Dependence on renal dialysis: Secondary | ICD-10-CM | POA: Diagnosis not present

## 2017-01-28 DIAGNOSIS — N186 End stage renal disease: Secondary | ICD-10-CM | POA: Diagnosis not present

## 2017-01-30 DIAGNOSIS — N186 End stage renal disease: Secondary | ICD-10-CM | POA: Diagnosis not present

## 2017-01-30 DIAGNOSIS — N2581 Secondary hyperparathyroidism of renal origin: Secondary | ICD-10-CM | POA: Diagnosis not present

## 2017-02-01 DIAGNOSIS — N2581 Secondary hyperparathyroidism of renal origin: Secondary | ICD-10-CM | POA: Diagnosis not present

## 2017-02-01 DIAGNOSIS — N186 End stage renal disease: Secondary | ICD-10-CM | POA: Diagnosis not present

## 2017-02-03 DIAGNOSIS — N2581 Secondary hyperparathyroidism of renal origin: Secondary | ICD-10-CM | POA: Diagnosis not present

## 2017-02-03 DIAGNOSIS — N186 End stage renal disease: Secondary | ICD-10-CM | POA: Diagnosis not present

## 2017-02-06 DIAGNOSIS — N2581 Secondary hyperparathyroidism of renal origin: Secondary | ICD-10-CM | POA: Diagnosis not present

## 2017-02-06 DIAGNOSIS — N186 End stage renal disease: Secondary | ICD-10-CM | POA: Diagnosis not present

## 2017-02-07 ENCOUNTER — Other Ambulatory Visit: Payer: Self-pay | Admitting: Family Medicine

## 2017-02-07 DIAGNOSIS — N184 Chronic kidney disease, stage 4 (severe): Secondary | ICD-10-CM

## 2017-02-08 DIAGNOSIS — N186 End stage renal disease: Secondary | ICD-10-CM | POA: Diagnosis not present

## 2017-02-08 DIAGNOSIS — N2581 Secondary hyperparathyroidism of renal origin: Secondary | ICD-10-CM | POA: Diagnosis not present

## 2017-02-10 DIAGNOSIS — N186 End stage renal disease: Secondary | ICD-10-CM | POA: Diagnosis not present

## 2017-02-10 DIAGNOSIS — N2581 Secondary hyperparathyroidism of renal origin: Secondary | ICD-10-CM | POA: Diagnosis not present

## 2017-02-13 DIAGNOSIS — N2581 Secondary hyperparathyroidism of renal origin: Secondary | ICD-10-CM | POA: Diagnosis not present

## 2017-02-13 DIAGNOSIS — N186 End stage renal disease: Secondary | ICD-10-CM | POA: Diagnosis not present

## 2017-02-15 DIAGNOSIS — N2581 Secondary hyperparathyroidism of renal origin: Secondary | ICD-10-CM | POA: Diagnosis not present

## 2017-02-15 DIAGNOSIS — N186 End stage renal disease: Secondary | ICD-10-CM | POA: Diagnosis not present

## 2017-02-17 DIAGNOSIS — N2581 Secondary hyperparathyroidism of renal origin: Secondary | ICD-10-CM | POA: Diagnosis not present

## 2017-02-17 DIAGNOSIS — N186 End stage renal disease: Secondary | ICD-10-CM | POA: Diagnosis not present

## 2017-02-20 ENCOUNTER — Other Ambulatory Visit: Payer: Self-pay | Admitting: Family Medicine

## 2017-02-20 DIAGNOSIS — N186 End stage renal disease: Secondary | ICD-10-CM | POA: Diagnosis not present

## 2017-02-20 DIAGNOSIS — N184 Chronic kidney disease, stage 4 (severe): Secondary | ICD-10-CM

## 2017-02-20 DIAGNOSIS — N2581 Secondary hyperparathyroidism of renal origin: Secondary | ICD-10-CM | POA: Diagnosis not present

## 2017-02-22 DIAGNOSIS — N2581 Secondary hyperparathyroidism of renal origin: Secondary | ICD-10-CM | POA: Diagnosis not present

## 2017-02-22 DIAGNOSIS — N186 End stage renal disease: Secondary | ICD-10-CM | POA: Diagnosis not present

## 2017-02-24 DIAGNOSIS — N2581 Secondary hyperparathyroidism of renal origin: Secondary | ICD-10-CM | POA: Diagnosis not present

## 2017-02-24 DIAGNOSIS — N186 End stage renal disease: Secondary | ICD-10-CM | POA: Diagnosis not present

## 2017-02-27 DIAGNOSIS — N186 End stage renal disease: Secondary | ICD-10-CM | POA: Diagnosis not present

## 2017-02-27 DIAGNOSIS — N2581 Secondary hyperparathyroidism of renal origin: Secondary | ICD-10-CM | POA: Diagnosis not present

## 2017-02-27 DIAGNOSIS — Z992 Dependence on renal dialysis: Secondary | ICD-10-CM | POA: Diagnosis not present

## 2017-02-27 DIAGNOSIS — E1122 Type 2 diabetes mellitus with diabetic chronic kidney disease: Secondary | ICD-10-CM | POA: Diagnosis not present

## 2017-03-01 DIAGNOSIS — N2581 Secondary hyperparathyroidism of renal origin: Secondary | ICD-10-CM | POA: Diagnosis not present

## 2017-03-01 DIAGNOSIS — D631 Anemia in chronic kidney disease: Secondary | ICD-10-CM | POA: Diagnosis not present

## 2017-03-01 DIAGNOSIS — N186 End stage renal disease: Secondary | ICD-10-CM | POA: Diagnosis not present

## 2017-03-01 DIAGNOSIS — D689 Coagulation defect, unspecified: Secondary | ICD-10-CM | POA: Diagnosis not present

## 2017-03-01 DIAGNOSIS — D509 Iron deficiency anemia, unspecified: Secondary | ICD-10-CM | POA: Diagnosis not present

## 2017-03-02 DIAGNOSIS — Z1231 Encounter for screening mammogram for malignant neoplasm of breast: Secondary | ICD-10-CM | POA: Diagnosis not present

## 2017-03-02 LAB — HM MAMMOGRAPHY

## 2017-03-03 DIAGNOSIS — D689 Coagulation defect, unspecified: Secondary | ICD-10-CM | POA: Diagnosis not present

## 2017-03-03 DIAGNOSIS — D509 Iron deficiency anemia, unspecified: Secondary | ICD-10-CM | POA: Diagnosis not present

## 2017-03-03 DIAGNOSIS — N2581 Secondary hyperparathyroidism of renal origin: Secondary | ICD-10-CM | POA: Diagnosis not present

## 2017-03-03 DIAGNOSIS — N186 End stage renal disease: Secondary | ICD-10-CM | POA: Diagnosis not present

## 2017-03-03 DIAGNOSIS — D631 Anemia in chronic kidney disease: Secondary | ICD-10-CM | POA: Diagnosis not present

## 2017-03-06 DIAGNOSIS — N186 End stage renal disease: Secondary | ICD-10-CM | POA: Diagnosis not present

## 2017-03-06 DIAGNOSIS — D689 Coagulation defect, unspecified: Secondary | ICD-10-CM | POA: Diagnosis not present

## 2017-03-06 DIAGNOSIS — D631 Anemia in chronic kidney disease: Secondary | ICD-10-CM | POA: Diagnosis not present

## 2017-03-06 DIAGNOSIS — N2581 Secondary hyperparathyroidism of renal origin: Secondary | ICD-10-CM | POA: Diagnosis not present

## 2017-03-06 DIAGNOSIS — D509 Iron deficiency anemia, unspecified: Secondary | ICD-10-CM | POA: Diagnosis not present

## 2017-03-07 ENCOUNTER — Encounter: Payer: Self-pay | Admitting: *Deleted

## 2017-03-08 DIAGNOSIS — D509 Iron deficiency anemia, unspecified: Secondary | ICD-10-CM | POA: Diagnosis not present

## 2017-03-08 DIAGNOSIS — D689 Coagulation defect, unspecified: Secondary | ICD-10-CM | POA: Diagnosis not present

## 2017-03-08 DIAGNOSIS — N186 End stage renal disease: Secondary | ICD-10-CM | POA: Diagnosis not present

## 2017-03-08 DIAGNOSIS — D631 Anemia in chronic kidney disease: Secondary | ICD-10-CM | POA: Diagnosis not present

## 2017-03-08 DIAGNOSIS — N2581 Secondary hyperparathyroidism of renal origin: Secondary | ICD-10-CM | POA: Diagnosis not present

## 2017-03-10 DIAGNOSIS — N2581 Secondary hyperparathyroidism of renal origin: Secondary | ICD-10-CM | POA: Diagnosis not present

## 2017-03-10 DIAGNOSIS — D509 Iron deficiency anemia, unspecified: Secondary | ICD-10-CM | POA: Diagnosis not present

## 2017-03-10 DIAGNOSIS — D689 Coagulation defect, unspecified: Secondary | ICD-10-CM | POA: Diagnosis not present

## 2017-03-10 DIAGNOSIS — N186 End stage renal disease: Secondary | ICD-10-CM | POA: Diagnosis not present

## 2017-03-10 DIAGNOSIS — D631 Anemia in chronic kidney disease: Secondary | ICD-10-CM | POA: Diagnosis not present

## 2017-03-13 DIAGNOSIS — D509 Iron deficiency anemia, unspecified: Secondary | ICD-10-CM | POA: Diagnosis not present

## 2017-03-13 DIAGNOSIS — D631 Anemia in chronic kidney disease: Secondary | ICD-10-CM | POA: Diagnosis not present

## 2017-03-13 DIAGNOSIS — N186 End stage renal disease: Secondary | ICD-10-CM | POA: Diagnosis not present

## 2017-03-13 DIAGNOSIS — D689 Coagulation defect, unspecified: Secondary | ICD-10-CM | POA: Diagnosis not present

## 2017-03-13 DIAGNOSIS — N2581 Secondary hyperparathyroidism of renal origin: Secondary | ICD-10-CM | POA: Diagnosis not present

## 2017-03-15 DIAGNOSIS — N186 End stage renal disease: Secondary | ICD-10-CM | POA: Diagnosis not present

## 2017-03-15 DIAGNOSIS — N2581 Secondary hyperparathyroidism of renal origin: Secondary | ICD-10-CM | POA: Diagnosis not present

## 2017-03-15 DIAGNOSIS — D631 Anemia in chronic kidney disease: Secondary | ICD-10-CM | POA: Diagnosis not present

## 2017-03-15 DIAGNOSIS — D689 Coagulation defect, unspecified: Secondary | ICD-10-CM | POA: Diagnosis not present

## 2017-03-15 DIAGNOSIS — D509 Iron deficiency anemia, unspecified: Secondary | ICD-10-CM | POA: Diagnosis not present

## 2017-03-17 DIAGNOSIS — D689 Coagulation defect, unspecified: Secondary | ICD-10-CM | POA: Diagnosis not present

## 2017-03-17 DIAGNOSIS — D509 Iron deficiency anemia, unspecified: Secondary | ICD-10-CM | POA: Diagnosis not present

## 2017-03-17 DIAGNOSIS — N2581 Secondary hyperparathyroidism of renal origin: Secondary | ICD-10-CM | POA: Diagnosis not present

## 2017-03-17 DIAGNOSIS — N186 End stage renal disease: Secondary | ICD-10-CM | POA: Diagnosis not present

## 2017-03-17 DIAGNOSIS — D631 Anemia in chronic kidney disease: Secondary | ICD-10-CM | POA: Diagnosis not present

## 2017-03-20 DIAGNOSIS — D631 Anemia in chronic kidney disease: Secondary | ICD-10-CM | POA: Diagnosis not present

## 2017-03-20 DIAGNOSIS — N186 End stage renal disease: Secondary | ICD-10-CM | POA: Diagnosis not present

## 2017-03-20 DIAGNOSIS — N2581 Secondary hyperparathyroidism of renal origin: Secondary | ICD-10-CM | POA: Diagnosis not present

## 2017-03-20 DIAGNOSIS — D689 Coagulation defect, unspecified: Secondary | ICD-10-CM | POA: Diagnosis not present

## 2017-03-20 DIAGNOSIS — D509 Iron deficiency anemia, unspecified: Secondary | ICD-10-CM | POA: Diagnosis not present

## 2017-03-21 DIAGNOSIS — N186 End stage renal disease: Secondary | ICD-10-CM | POA: Diagnosis not present

## 2017-03-21 DIAGNOSIS — I871 Compression of vein: Secondary | ICD-10-CM | POA: Diagnosis not present

## 2017-03-21 DIAGNOSIS — T82858A Stenosis of vascular prosthetic devices, implants and grafts, initial encounter: Secondary | ICD-10-CM | POA: Diagnosis not present

## 2017-03-21 DIAGNOSIS — Z992 Dependence on renal dialysis: Secondary | ICD-10-CM | POA: Diagnosis not present

## 2017-03-22 DIAGNOSIS — N2581 Secondary hyperparathyroidism of renal origin: Secondary | ICD-10-CM | POA: Diagnosis not present

## 2017-03-22 DIAGNOSIS — D509 Iron deficiency anemia, unspecified: Secondary | ICD-10-CM | POA: Diagnosis not present

## 2017-03-22 DIAGNOSIS — D689 Coagulation defect, unspecified: Secondary | ICD-10-CM | POA: Diagnosis not present

## 2017-03-22 DIAGNOSIS — N186 End stage renal disease: Secondary | ICD-10-CM | POA: Diagnosis not present

## 2017-03-22 DIAGNOSIS — D631 Anemia in chronic kidney disease: Secondary | ICD-10-CM | POA: Diagnosis not present

## 2017-03-23 DIAGNOSIS — D689 Coagulation defect, unspecified: Secondary | ICD-10-CM | POA: Diagnosis not present

## 2017-03-23 DIAGNOSIS — N2581 Secondary hyperparathyroidism of renal origin: Secondary | ICD-10-CM | POA: Diagnosis not present

## 2017-03-23 DIAGNOSIS — D509 Iron deficiency anemia, unspecified: Secondary | ICD-10-CM | POA: Diagnosis not present

## 2017-03-23 DIAGNOSIS — N186 End stage renal disease: Secondary | ICD-10-CM | POA: Diagnosis not present

## 2017-03-23 DIAGNOSIS — D631 Anemia in chronic kidney disease: Secondary | ICD-10-CM | POA: Diagnosis not present

## 2017-03-27 DIAGNOSIS — D689 Coagulation defect, unspecified: Secondary | ICD-10-CM | POA: Diagnosis not present

## 2017-03-27 DIAGNOSIS — D631 Anemia in chronic kidney disease: Secondary | ICD-10-CM | POA: Diagnosis not present

## 2017-03-27 DIAGNOSIS — D509 Iron deficiency anemia, unspecified: Secondary | ICD-10-CM | POA: Diagnosis not present

## 2017-03-27 DIAGNOSIS — N186 End stage renal disease: Secondary | ICD-10-CM | POA: Diagnosis not present

## 2017-03-27 DIAGNOSIS — N2581 Secondary hyperparathyroidism of renal origin: Secondary | ICD-10-CM | POA: Diagnosis not present

## 2017-03-28 ENCOUNTER — Other Ambulatory Visit: Payer: Self-pay | Admitting: Family Medicine

## 2017-03-28 DIAGNOSIS — N184 Chronic kidney disease, stage 4 (severe): Secondary | ICD-10-CM

## 2017-03-29 DIAGNOSIS — N2581 Secondary hyperparathyroidism of renal origin: Secondary | ICD-10-CM | POA: Diagnosis not present

## 2017-03-29 DIAGNOSIS — D509 Iron deficiency anemia, unspecified: Secondary | ICD-10-CM | POA: Diagnosis not present

## 2017-03-29 DIAGNOSIS — N186 End stage renal disease: Secondary | ICD-10-CM | POA: Diagnosis not present

## 2017-03-29 DIAGNOSIS — D631 Anemia in chronic kidney disease: Secondary | ICD-10-CM | POA: Diagnosis not present

## 2017-03-29 DIAGNOSIS — D689 Coagulation defect, unspecified: Secondary | ICD-10-CM | POA: Diagnosis not present

## 2017-03-30 DIAGNOSIS — E1122 Type 2 diabetes mellitus with diabetic chronic kidney disease: Secondary | ICD-10-CM | POA: Diagnosis not present

## 2017-03-30 DIAGNOSIS — N186 End stage renal disease: Secondary | ICD-10-CM | POA: Diagnosis not present

## 2017-03-30 DIAGNOSIS — Z992 Dependence on renal dialysis: Secondary | ICD-10-CM | POA: Diagnosis not present

## 2017-03-31 DIAGNOSIS — D689 Coagulation defect, unspecified: Secondary | ICD-10-CM | POA: Diagnosis not present

## 2017-03-31 DIAGNOSIS — N2581 Secondary hyperparathyroidism of renal origin: Secondary | ICD-10-CM | POA: Diagnosis not present

## 2017-03-31 DIAGNOSIS — D631 Anemia in chronic kidney disease: Secondary | ICD-10-CM | POA: Diagnosis not present

## 2017-03-31 DIAGNOSIS — D509 Iron deficiency anemia, unspecified: Secondary | ICD-10-CM | POA: Diagnosis not present

## 2017-03-31 DIAGNOSIS — N186 End stage renal disease: Secondary | ICD-10-CM | POA: Diagnosis not present

## 2017-04-03 DIAGNOSIS — N2581 Secondary hyperparathyroidism of renal origin: Secondary | ICD-10-CM | POA: Diagnosis not present

## 2017-04-03 DIAGNOSIS — D631 Anemia in chronic kidney disease: Secondary | ICD-10-CM | POA: Diagnosis not present

## 2017-04-03 DIAGNOSIS — N186 End stage renal disease: Secondary | ICD-10-CM | POA: Diagnosis not present

## 2017-04-03 DIAGNOSIS — D689 Coagulation defect, unspecified: Secondary | ICD-10-CM | POA: Diagnosis not present

## 2017-04-03 DIAGNOSIS — D509 Iron deficiency anemia, unspecified: Secondary | ICD-10-CM | POA: Diagnosis not present

## 2017-04-05 DIAGNOSIS — D689 Coagulation defect, unspecified: Secondary | ICD-10-CM | POA: Diagnosis not present

## 2017-04-05 DIAGNOSIS — D631 Anemia in chronic kidney disease: Secondary | ICD-10-CM | POA: Diagnosis not present

## 2017-04-05 DIAGNOSIS — N186 End stage renal disease: Secondary | ICD-10-CM | POA: Diagnosis not present

## 2017-04-05 DIAGNOSIS — N2581 Secondary hyperparathyroidism of renal origin: Secondary | ICD-10-CM | POA: Diagnosis not present

## 2017-04-05 DIAGNOSIS — D509 Iron deficiency anemia, unspecified: Secondary | ICD-10-CM | POA: Diagnosis not present

## 2017-04-07 DIAGNOSIS — D509 Iron deficiency anemia, unspecified: Secondary | ICD-10-CM | POA: Diagnosis not present

## 2017-04-07 DIAGNOSIS — D631 Anemia in chronic kidney disease: Secondary | ICD-10-CM | POA: Diagnosis not present

## 2017-04-07 DIAGNOSIS — N186 End stage renal disease: Secondary | ICD-10-CM | POA: Diagnosis not present

## 2017-04-07 DIAGNOSIS — D689 Coagulation defect, unspecified: Secondary | ICD-10-CM | POA: Diagnosis not present

## 2017-04-07 DIAGNOSIS — N2581 Secondary hyperparathyroidism of renal origin: Secondary | ICD-10-CM | POA: Diagnosis not present

## 2017-04-10 DIAGNOSIS — D509 Iron deficiency anemia, unspecified: Secondary | ICD-10-CM | POA: Diagnosis not present

## 2017-04-10 DIAGNOSIS — D631 Anemia in chronic kidney disease: Secondary | ICD-10-CM | POA: Diagnosis not present

## 2017-04-10 DIAGNOSIS — D689 Coagulation defect, unspecified: Secondary | ICD-10-CM | POA: Diagnosis not present

## 2017-04-10 DIAGNOSIS — N2581 Secondary hyperparathyroidism of renal origin: Secondary | ICD-10-CM | POA: Diagnosis not present

## 2017-04-10 DIAGNOSIS — N186 End stage renal disease: Secondary | ICD-10-CM | POA: Diagnosis not present

## 2017-04-12 DIAGNOSIS — D631 Anemia in chronic kidney disease: Secondary | ICD-10-CM | POA: Diagnosis not present

## 2017-04-12 DIAGNOSIS — D509 Iron deficiency anemia, unspecified: Secondary | ICD-10-CM | POA: Diagnosis not present

## 2017-04-12 DIAGNOSIS — D689 Coagulation defect, unspecified: Secondary | ICD-10-CM | POA: Diagnosis not present

## 2017-04-12 DIAGNOSIS — N2581 Secondary hyperparathyroidism of renal origin: Secondary | ICD-10-CM | POA: Diagnosis not present

## 2017-04-12 DIAGNOSIS — N186 End stage renal disease: Secondary | ICD-10-CM | POA: Diagnosis not present

## 2017-04-14 DIAGNOSIS — D509 Iron deficiency anemia, unspecified: Secondary | ICD-10-CM | POA: Diagnosis not present

## 2017-04-14 DIAGNOSIS — D631 Anemia in chronic kidney disease: Secondary | ICD-10-CM | POA: Diagnosis not present

## 2017-04-14 DIAGNOSIS — D689 Coagulation defect, unspecified: Secondary | ICD-10-CM | POA: Diagnosis not present

## 2017-04-14 DIAGNOSIS — N186 End stage renal disease: Secondary | ICD-10-CM | POA: Diagnosis not present

## 2017-04-14 DIAGNOSIS — N2581 Secondary hyperparathyroidism of renal origin: Secondary | ICD-10-CM | POA: Diagnosis not present

## 2017-04-17 DIAGNOSIS — D631 Anemia in chronic kidney disease: Secondary | ICD-10-CM | POA: Diagnosis not present

## 2017-04-17 DIAGNOSIS — N186 End stage renal disease: Secondary | ICD-10-CM | POA: Diagnosis not present

## 2017-04-17 DIAGNOSIS — N2581 Secondary hyperparathyroidism of renal origin: Secondary | ICD-10-CM | POA: Diagnosis not present

## 2017-04-17 DIAGNOSIS — D509 Iron deficiency anemia, unspecified: Secondary | ICD-10-CM | POA: Diagnosis not present

## 2017-04-17 DIAGNOSIS — D689 Coagulation defect, unspecified: Secondary | ICD-10-CM | POA: Diagnosis not present

## 2017-04-19 DIAGNOSIS — D631 Anemia in chronic kidney disease: Secondary | ICD-10-CM | POA: Diagnosis not present

## 2017-04-19 DIAGNOSIS — D509 Iron deficiency anemia, unspecified: Secondary | ICD-10-CM | POA: Diagnosis not present

## 2017-04-19 DIAGNOSIS — N186 End stage renal disease: Secondary | ICD-10-CM | POA: Diagnosis not present

## 2017-04-19 DIAGNOSIS — N2581 Secondary hyperparathyroidism of renal origin: Secondary | ICD-10-CM | POA: Diagnosis not present

## 2017-04-19 DIAGNOSIS — D689 Coagulation defect, unspecified: Secondary | ICD-10-CM | POA: Diagnosis not present

## 2017-04-21 DIAGNOSIS — D631 Anemia in chronic kidney disease: Secondary | ICD-10-CM | POA: Diagnosis not present

## 2017-04-21 DIAGNOSIS — N2581 Secondary hyperparathyroidism of renal origin: Secondary | ICD-10-CM | POA: Diagnosis not present

## 2017-04-21 DIAGNOSIS — N186 End stage renal disease: Secondary | ICD-10-CM | POA: Diagnosis not present

## 2017-04-21 DIAGNOSIS — D509 Iron deficiency anemia, unspecified: Secondary | ICD-10-CM | POA: Diagnosis not present

## 2017-04-21 DIAGNOSIS — D689 Coagulation defect, unspecified: Secondary | ICD-10-CM | POA: Diagnosis not present

## 2017-04-24 DIAGNOSIS — D631 Anemia in chronic kidney disease: Secondary | ICD-10-CM | POA: Diagnosis not present

## 2017-04-24 DIAGNOSIS — N186 End stage renal disease: Secondary | ICD-10-CM | POA: Diagnosis not present

## 2017-04-24 DIAGNOSIS — D689 Coagulation defect, unspecified: Secondary | ICD-10-CM | POA: Diagnosis not present

## 2017-04-24 DIAGNOSIS — N2581 Secondary hyperparathyroidism of renal origin: Secondary | ICD-10-CM | POA: Diagnosis not present

## 2017-04-24 DIAGNOSIS — D509 Iron deficiency anemia, unspecified: Secondary | ICD-10-CM | POA: Diagnosis not present

## 2017-04-26 DIAGNOSIS — N2581 Secondary hyperparathyroidism of renal origin: Secondary | ICD-10-CM | POA: Diagnosis not present

## 2017-04-26 DIAGNOSIS — D689 Coagulation defect, unspecified: Secondary | ICD-10-CM | POA: Diagnosis not present

## 2017-04-26 DIAGNOSIS — N186 End stage renal disease: Secondary | ICD-10-CM | POA: Diagnosis not present

## 2017-04-26 DIAGNOSIS — D509 Iron deficiency anemia, unspecified: Secondary | ICD-10-CM | POA: Diagnosis not present

## 2017-04-26 DIAGNOSIS — D631 Anemia in chronic kidney disease: Secondary | ICD-10-CM | POA: Diagnosis not present

## 2017-04-28 DIAGNOSIS — D689 Coagulation defect, unspecified: Secondary | ICD-10-CM | POA: Diagnosis not present

## 2017-04-28 DIAGNOSIS — D509 Iron deficiency anemia, unspecified: Secondary | ICD-10-CM | POA: Diagnosis not present

## 2017-04-28 DIAGNOSIS — N186 End stage renal disease: Secondary | ICD-10-CM | POA: Diagnosis not present

## 2017-04-28 DIAGNOSIS — D631 Anemia in chronic kidney disease: Secondary | ICD-10-CM | POA: Diagnosis not present

## 2017-04-28 DIAGNOSIS — N2581 Secondary hyperparathyroidism of renal origin: Secondary | ICD-10-CM | POA: Diagnosis not present

## 2017-04-29 DIAGNOSIS — N186 End stage renal disease: Secondary | ICD-10-CM | POA: Diagnosis not present

## 2017-04-29 DIAGNOSIS — E1122 Type 2 diabetes mellitus with diabetic chronic kidney disease: Secondary | ICD-10-CM | POA: Diagnosis not present

## 2017-04-29 DIAGNOSIS — Z992 Dependence on renal dialysis: Secondary | ICD-10-CM | POA: Diagnosis not present

## 2017-05-01 DIAGNOSIS — N2581 Secondary hyperparathyroidism of renal origin: Secondary | ICD-10-CM | POA: Diagnosis not present

## 2017-05-01 DIAGNOSIS — D689 Coagulation defect, unspecified: Secondary | ICD-10-CM | POA: Diagnosis not present

## 2017-05-01 DIAGNOSIS — N186 End stage renal disease: Secondary | ICD-10-CM | POA: Diagnosis not present

## 2017-05-01 DIAGNOSIS — E119 Type 2 diabetes mellitus without complications: Secondary | ICD-10-CM | POA: Diagnosis not present

## 2017-05-01 DIAGNOSIS — D509 Iron deficiency anemia, unspecified: Secondary | ICD-10-CM | POA: Diagnosis not present

## 2017-05-01 DIAGNOSIS — D631 Anemia in chronic kidney disease: Secondary | ICD-10-CM | POA: Diagnosis not present

## 2017-05-03 DIAGNOSIS — D631 Anemia in chronic kidney disease: Secondary | ICD-10-CM | POA: Diagnosis not present

## 2017-05-03 DIAGNOSIS — D509 Iron deficiency anemia, unspecified: Secondary | ICD-10-CM | POA: Diagnosis not present

## 2017-05-03 DIAGNOSIS — D689 Coagulation defect, unspecified: Secondary | ICD-10-CM | POA: Diagnosis not present

## 2017-05-03 DIAGNOSIS — N2581 Secondary hyperparathyroidism of renal origin: Secondary | ICD-10-CM | POA: Diagnosis not present

## 2017-05-03 DIAGNOSIS — N186 End stage renal disease: Secondary | ICD-10-CM | POA: Diagnosis not present

## 2017-05-03 DIAGNOSIS — E119 Type 2 diabetes mellitus without complications: Secondary | ICD-10-CM | POA: Diagnosis not present

## 2017-05-05 DIAGNOSIS — N2581 Secondary hyperparathyroidism of renal origin: Secondary | ICD-10-CM | POA: Diagnosis not present

## 2017-05-05 DIAGNOSIS — D689 Coagulation defect, unspecified: Secondary | ICD-10-CM | POA: Diagnosis not present

## 2017-05-05 DIAGNOSIS — D631 Anemia in chronic kidney disease: Secondary | ICD-10-CM | POA: Diagnosis not present

## 2017-05-05 DIAGNOSIS — D509 Iron deficiency anemia, unspecified: Secondary | ICD-10-CM | POA: Diagnosis not present

## 2017-05-05 DIAGNOSIS — E119 Type 2 diabetes mellitus without complications: Secondary | ICD-10-CM | POA: Diagnosis not present

## 2017-05-05 DIAGNOSIS — N186 End stage renal disease: Secondary | ICD-10-CM | POA: Diagnosis not present

## 2017-05-08 DIAGNOSIS — D631 Anemia in chronic kidney disease: Secondary | ICD-10-CM | POA: Diagnosis not present

## 2017-05-08 DIAGNOSIS — E119 Type 2 diabetes mellitus without complications: Secondary | ICD-10-CM | POA: Diagnosis not present

## 2017-05-08 DIAGNOSIS — D509 Iron deficiency anemia, unspecified: Secondary | ICD-10-CM | POA: Diagnosis not present

## 2017-05-08 DIAGNOSIS — N2581 Secondary hyperparathyroidism of renal origin: Secondary | ICD-10-CM | POA: Diagnosis not present

## 2017-05-08 DIAGNOSIS — N186 End stage renal disease: Secondary | ICD-10-CM | POA: Diagnosis not present

## 2017-05-08 DIAGNOSIS — D689 Coagulation defect, unspecified: Secondary | ICD-10-CM | POA: Diagnosis not present

## 2017-05-10 DIAGNOSIS — D689 Coagulation defect, unspecified: Secondary | ICD-10-CM | POA: Diagnosis not present

## 2017-05-10 DIAGNOSIS — E119 Type 2 diabetes mellitus without complications: Secondary | ICD-10-CM | POA: Diagnosis not present

## 2017-05-10 DIAGNOSIS — N2581 Secondary hyperparathyroidism of renal origin: Secondary | ICD-10-CM | POA: Diagnosis not present

## 2017-05-10 DIAGNOSIS — D631 Anemia in chronic kidney disease: Secondary | ICD-10-CM | POA: Diagnosis not present

## 2017-05-10 DIAGNOSIS — N186 End stage renal disease: Secondary | ICD-10-CM | POA: Diagnosis not present

## 2017-05-10 DIAGNOSIS — D509 Iron deficiency anemia, unspecified: Secondary | ICD-10-CM | POA: Diagnosis not present

## 2017-05-11 ENCOUNTER — Other Ambulatory Visit: Payer: Self-pay | Admitting: Family Medicine

## 2017-05-11 DIAGNOSIS — I10 Essential (primary) hypertension: Secondary | ICD-10-CM

## 2017-05-12 DIAGNOSIS — E119 Type 2 diabetes mellitus without complications: Secondary | ICD-10-CM | POA: Diagnosis not present

## 2017-05-12 DIAGNOSIS — D689 Coagulation defect, unspecified: Secondary | ICD-10-CM | POA: Diagnosis not present

## 2017-05-12 DIAGNOSIS — D509 Iron deficiency anemia, unspecified: Secondary | ICD-10-CM | POA: Diagnosis not present

## 2017-05-12 DIAGNOSIS — N2581 Secondary hyperparathyroidism of renal origin: Secondary | ICD-10-CM | POA: Diagnosis not present

## 2017-05-12 DIAGNOSIS — D631 Anemia in chronic kidney disease: Secondary | ICD-10-CM | POA: Diagnosis not present

## 2017-05-12 DIAGNOSIS — N186 End stage renal disease: Secondary | ICD-10-CM | POA: Diagnosis not present

## 2017-05-15 DIAGNOSIS — D509 Iron deficiency anemia, unspecified: Secondary | ICD-10-CM | POA: Diagnosis not present

## 2017-05-15 DIAGNOSIS — N2581 Secondary hyperparathyroidism of renal origin: Secondary | ICD-10-CM | POA: Diagnosis not present

## 2017-05-15 DIAGNOSIS — D689 Coagulation defect, unspecified: Secondary | ICD-10-CM | POA: Diagnosis not present

## 2017-05-15 DIAGNOSIS — E119 Type 2 diabetes mellitus without complications: Secondary | ICD-10-CM | POA: Diagnosis not present

## 2017-05-15 DIAGNOSIS — D631 Anemia in chronic kidney disease: Secondary | ICD-10-CM | POA: Diagnosis not present

## 2017-05-15 DIAGNOSIS — N186 End stage renal disease: Secondary | ICD-10-CM | POA: Diagnosis not present

## 2017-05-17 DIAGNOSIS — N2581 Secondary hyperparathyroidism of renal origin: Secondary | ICD-10-CM | POA: Diagnosis not present

## 2017-05-17 DIAGNOSIS — E119 Type 2 diabetes mellitus without complications: Secondary | ICD-10-CM | POA: Diagnosis not present

## 2017-05-17 DIAGNOSIS — D509 Iron deficiency anemia, unspecified: Secondary | ICD-10-CM | POA: Diagnosis not present

## 2017-05-17 DIAGNOSIS — N186 End stage renal disease: Secondary | ICD-10-CM | POA: Diagnosis not present

## 2017-05-17 DIAGNOSIS — D689 Coagulation defect, unspecified: Secondary | ICD-10-CM | POA: Diagnosis not present

## 2017-05-17 DIAGNOSIS — D631 Anemia in chronic kidney disease: Secondary | ICD-10-CM | POA: Diagnosis not present

## 2017-05-19 DIAGNOSIS — N2581 Secondary hyperparathyroidism of renal origin: Secondary | ICD-10-CM | POA: Diagnosis not present

## 2017-05-19 DIAGNOSIS — E119 Type 2 diabetes mellitus without complications: Secondary | ICD-10-CM | POA: Diagnosis not present

## 2017-05-19 DIAGNOSIS — D689 Coagulation defect, unspecified: Secondary | ICD-10-CM | POA: Diagnosis not present

## 2017-05-19 DIAGNOSIS — D509 Iron deficiency anemia, unspecified: Secondary | ICD-10-CM | POA: Diagnosis not present

## 2017-05-19 DIAGNOSIS — D631 Anemia in chronic kidney disease: Secondary | ICD-10-CM | POA: Diagnosis not present

## 2017-05-19 DIAGNOSIS — N186 End stage renal disease: Secondary | ICD-10-CM | POA: Diagnosis not present

## 2017-05-22 DIAGNOSIS — E119 Type 2 diabetes mellitus without complications: Secondary | ICD-10-CM | POA: Diagnosis not present

## 2017-05-22 DIAGNOSIS — D509 Iron deficiency anemia, unspecified: Secondary | ICD-10-CM | POA: Diagnosis not present

## 2017-05-22 DIAGNOSIS — D689 Coagulation defect, unspecified: Secondary | ICD-10-CM | POA: Diagnosis not present

## 2017-05-22 DIAGNOSIS — N186 End stage renal disease: Secondary | ICD-10-CM | POA: Diagnosis not present

## 2017-05-22 DIAGNOSIS — D631 Anemia in chronic kidney disease: Secondary | ICD-10-CM | POA: Diagnosis not present

## 2017-05-22 DIAGNOSIS — N2581 Secondary hyperparathyroidism of renal origin: Secondary | ICD-10-CM | POA: Diagnosis not present

## 2017-05-24 DIAGNOSIS — N2581 Secondary hyperparathyroidism of renal origin: Secondary | ICD-10-CM | POA: Diagnosis not present

## 2017-05-24 DIAGNOSIS — D631 Anemia in chronic kidney disease: Secondary | ICD-10-CM | POA: Diagnosis not present

## 2017-05-24 DIAGNOSIS — D689 Coagulation defect, unspecified: Secondary | ICD-10-CM | POA: Diagnosis not present

## 2017-05-24 DIAGNOSIS — D509 Iron deficiency anemia, unspecified: Secondary | ICD-10-CM | POA: Diagnosis not present

## 2017-05-24 DIAGNOSIS — E119 Type 2 diabetes mellitus without complications: Secondary | ICD-10-CM | POA: Diagnosis not present

## 2017-05-24 DIAGNOSIS — N186 End stage renal disease: Secondary | ICD-10-CM | POA: Diagnosis not present

## 2017-05-26 DIAGNOSIS — N186 End stage renal disease: Secondary | ICD-10-CM | POA: Diagnosis not present

## 2017-05-26 DIAGNOSIS — D509 Iron deficiency anemia, unspecified: Secondary | ICD-10-CM | POA: Diagnosis not present

## 2017-05-26 DIAGNOSIS — D631 Anemia in chronic kidney disease: Secondary | ICD-10-CM | POA: Diagnosis not present

## 2017-05-26 DIAGNOSIS — N2581 Secondary hyperparathyroidism of renal origin: Secondary | ICD-10-CM | POA: Diagnosis not present

## 2017-05-26 DIAGNOSIS — E119 Type 2 diabetes mellitus without complications: Secondary | ICD-10-CM | POA: Diagnosis not present

## 2017-05-26 DIAGNOSIS — D689 Coagulation defect, unspecified: Secondary | ICD-10-CM | POA: Diagnosis not present

## 2017-05-29 DIAGNOSIS — D689 Coagulation defect, unspecified: Secondary | ICD-10-CM | POA: Diagnosis not present

## 2017-05-29 DIAGNOSIS — N186 End stage renal disease: Secondary | ICD-10-CM | POA: Diagnosis not present

## 2017-05-29 DIAGNOSIS — D509 Iron deficiency anemia, unspecified: Secondary | ICD-10-CM | POA: Diagnosis not present

## 2017-05-29 DIAGNOSIS — D631 Anemia in chronic kidney disease: Secondary | ICD-10-CM | POA: Diagnosis not present

## 2017-05-29 DIAGNOSIS — N2581 Secondary hyperparathyroidism of renal origin: Secondary | ICD-10-CM | POA: Diagnosis not present

## 2017-05-29 DIAGNOSIS — E119 Type 2 diabetes mellitus without complications: Secondary | ICD-10-CM | POA: Diagnosis not present

## 2017-05-30 DIAGNOSIS — N186 End stage renal disease: Secondary | ICD-10-CM | POA: Diagnosis not present

## 2017-05-30 DIAGNOSIS — Z992 Dependence on renal dialysis: Secondary | ICD-10-CM | POA: Diagnosis not present

## 2017-05-30 DIAGNOSIS — E1122 Type 2 diabetes mellitus with diabetic chronic kidney disease: Secondary | ICD-10-CM | POA: Diagnosis not present

## 2017-05-31 DIAGNOSIS — D509 Iron deficiency anemia, unspecified: Secondary | ICD-10-CM | POA: Diagnosis not present

## 2017-05-31 DIAGNOSIS — N2581 Secondary hyperparathyroidism of renal origin: Secondary | ICD-10-CM | POA: Diagnosis not present

## 2017-05-31 DIAGNOSIS — D689 Coagulation defect, unspecified: Secondary | ICD-10-CM | POA: Diagnosis not present

## 2017-05-31 DIAGNOSIS — Z23 Encounter for immunization: Secondary | ICD-10-CM | POA: Diagnosis not present

## 2017-05-31 DIAGNOSIS — N186 End stage renal disease: Secondary | ICD-10-CM | POA: Diagnosis not present

## 2017-05-31 DIAGNOSIS — D631 Anemia in chronic kidney disease: Secondary | ICD-10-CM | POA: Diagnosis not present

## 2017-06-02 DIAGNOSIS — D631 Anemia in chronic kidney disease: Secondary | ICD-10-CM | POA: Diagnosis not present

## 2017-06-02 DIAGNOSIS — D509 Iron deficiency anemia, unspecified: Secondary | ICD-10-CM | POA: Diagnosis not present

## 2017-06-02 DIAGNOSIS — N2581 Secondary hyperparathyroidism of renal origin: Secondary | ICD-10-CM | POA: Diagnosis not present

## 2017-06-02 DIAGNOSIS — D689 Coagulation defect, unspecified: Secondary | ICD-10-CM | POA: Diagnosis not present

## 2017-06-02 DIAGNOSIS — Z23 Encounter for immunization: Secondary | ICD-10-CM | POA: Diagnosis not present

## 2017-06-02 DIAGNOSIS — N186 End stage renal disease: Secondary | ICD-10-CM | POA: Diagnosis not present

## 2017-06-05 DIAGNOSIS — N186 End stage renal disease: Secondary | ICD-10-CM | POA: Diagnosis not present

## 2017-06-05 DIAGNOSIS — N2581 Secondary hyperparathyroidism of renal origin: Secondary | ICD-10-CM | POA: Diagnosis not present

## 2017-06-05 DIAGNOSIS — D509 Iron deficiency anemia, unspecified: Secondary | ICD-10-CM | POA: Diagnosis not present

## 2017-06-05 DIAGNOSIS — Z23 Encounter for immunization: Secondary | ICD-10-CM | POA: Diagnosis not present

## 2017-06-05 DIAGNOSIS — D689 Coagulation defect, unspecified: Secondary | ICD-10-CM | POA: Diagnosis not present

## 2017-06-05 DIAGNOSIS — D631 Anemia in chronic kidney disease: Secondary | ICD-10-CM | POA: Diagnosis not present

## 2017-06-07 DIAGNOSIS — D509 Iron deficiency anemia, unspecified: Secondary | ICD-10-CM | POA: Diagnosis not present

## 2017-06-07 DIAGNOSIS — N186 End stage renal disease: Secondary | ICD-10-CM | POA: Diagnosis not present

## 2017-06-07 DIAGNOSIS — Z23 Encounter for immunization: Secondary | ICD-10-CM | POA: Diagnosis not present

## 2017-06-07 DIAGNOSIS — N2581 Secondary hyperparathyroidism of renal origin: Secondary | ICD-10-CM | POA: Diagnosis not present

## 2017-06-07 DIAGNOSIS — D631 Anemia in chronic kidney disease: Secondary | ICD-10-CM | POA: Diagnosis not present

## 2017-06-07 DIAGNOSIS — D689 Coagulation defect, unspecified: Secondary | ICD-10-CM | POA: Diagnosis not present

## 2017-06-09 DIAGNOSIS — N2581 Secondary hyperparathyroidism of renal origin: Secondary | ICD-10-CM | POA: Diagnosis not present

## 2017-06-09 DIAGNOSIS — D689 Coagulation defect, unspecified: Secondary | ICD-10-CM | POA: Diagnosis not present

## 2017-06-09 DIAGNOSIS — Z23 Encounter for immunization: Secondary | ICD-10-CM | POA: Diagnosis not present

## 2017-06-09 DIAGNOSIS — N186 End stage renal disease: Secondary | ICD-10-CM | POA: Diagnosis not present

## 2017-06-09 DIAGNOSIS — D509 Iron deficiency anemia, unspecified: Secondary | ICD-10-CM | POA: Diagnosis not present

## 2017-06-09 DIAGNOSIS — D631 Anemia in chronic kidney disease: Secondary | ICD-10-CM | POA: Diagnosis not present

## 2017-06-12 DIAGNOSIS — N186 End stage renal disease: Secondary | ICD-10-CM | POA: Diagnosis not present

## 2017-06-12 DIAGNOSIS — N2581 Secondary hyperparathyroidism of renal origin: Secondary | ICD-10-CM | POA: Diagnosis not present

## 2017-06-12 DIAGNOSIS — D631 Anemia in chronic kidney disease: Secondary | ICD-10-CM | POA: Diagnosis not present

## 2017-06-12 DIAGNOSIS — D509 Iron deficiency anemia, unspecified: Secondary | ICD-10-CM | POA: Diagnosis not present

## 2017-06-12 DIAGNOSIS — Z23 Encounter for immunization: Secondary | ICD-10-CM | POA: Diagnosis not present

## 2017-06-12 DIAGNOSIS — D689 Coagulation defect, unspecified: Secondary | ICD-10-CM | POA: Diagnosis not present

## 2017-06-14 DIAGNOSIS — N2581 Secondary hyperparathyroidism of renal origin: Secondary | ICD-10-CM | POA: Diagnosis not present

## 2017-06-14 DIAGNOSIS — D689 Coagulation defect, unspecified: Secondary | ICD-10-CM | POA: Diagnosis not present

## 2017-06-14 DIAGNOSIS — D631 Anemia in chronic kidney disease: Secondary | ICD-10-CM | POA: Diagnosis not present

## 2017-06-14 DIAGNOSIS — D509 Iron deficiency anemia, unspecified: Secondary | ICD-10-CM | POA: Diagnosis not present

## 2017-06-14 DIAGNOSIS — Z23 Encounter for immunization: Secondary | ICD-10-CM | POA: Diagnosis not present

## 2017-06-14 DIAGNOSIS — N186 End stage renal disease: Secondary | ICD-10-CM | POA: Diagnosis not present

## 2017-06-15 DIAGNOSIS — D61818 Other pancytopenia: Secondary | ICD-10-CM | POA: Diagnosis not present

## 2017-06-16 DIAGNOSIS — N186 End stage renal disease: Secondary | ICD-10-CM | POA: Diagnosis not present

## 2017-06-16 DIAGNOSIS — D631 Anemia in chronic kidney disease: Secondary | ICD-10-CM | POA: Diagnosis not present

## 2017-06-16 DIAGNOSIS — Z23 Encounter for immunization: Secondary | ICD-10-CM | POA: Diagnosis not present

## 2017-06-16 DIAGNOSIS — N2581 Secondary hyperparathyroidism of renal origin: Secondary | ICD-10-CM | POA: Diagnosis not present

## 2017-06-16 DIAGNOSIS — D509 Iron deficiency anemia, unspecified: Secondary | ICD-10-CM | POA: Diagnosis not present

## 2017-06-16 DIAGNOSIS — D689 Coagulation defect, unspecified: Secondary | ICD-10-CM | POA: Diagnosis not present

## 2017-06-19 DIAGNOSIS — D509 Iron deficiency anemia, unspecified: Secondary | ICD-10-CM | POA: Diagnosis not present

## 2017-06-19 DIAGNOSIS — Z23 Encounter for immunization: Secondary | ICD-10-CM | POA: Diagnosis not present

## 2017-06-19 DIAGNOSIS — D631 Anemia in chronic kidney disease: Secondary | ICD-10-CM | POA: Diagnosis not present

## 2017-06-19 DIAGNOSIS — D689 Coagulation defect, unspecified: Secondary | ICD-10-CM | POA: Diagnosis not present

## 2017-06-19 DIAGNOSIS — N186 End stage renal disease: Secondary | ICD-10-CM | POA: Diagnosis not present

## 2017-06-19 DIAGNOSIS — N2581 Secondary hyperparathyroidism of renal origin: Secondary | ICD-10-CM | POA: Diagnosis not present

## 2017-06-21 DIAGNOSIS — Z23 Encounter for immunization: Secondary | ICD-10-CM | POA: Diagnosis not present

## 2017-06-21 DIAGNOSIS — N2581 Secondary hyperparathyroidism of renal origin: Secondary | ICD-10-CM | POA: Diagnosis not present

## 2017-06-21 DIAGNOSIS — N186 End stage renal disease: Secondary | ICD-10-CM | POA: Diagnosis not present

## 2017-06-21 DIAGNOSIS — D509 Iron deficiency anemia, unspecified: Secondary | ICD-10-CM | POA: Diagnosis not present

## 2017-06-21 DIAGNOSIS — D689 Coagulation defect, unspecified: Secondary | ICD-10-CM | POA: Diagnosis not present

## 2017-06-21 DIAGNOSIS — D631 Anemia in chronic kidney disease: Secondary | ICD-10-CM | POA: Diagnosis not present

## 2017-06-22 ENCOUNTER — Encounter: Payer: Medicare HMO | Admitting: Family Medicine

## 2017-06-23 DIAGNOSIS — D689 Coagulation defect, unspecified: Secondary | ICD-10-CM | POA: Diagnosis not present

## 2017-06-23 DIAGNOSIS — N2581 Secondary hyperparathyroidism of renal origin: Secondary | ICD-10-CM | POA: Diagnosis not present

## 2017-06-23 DIAGNOSIS — Z23 Encounter for immunization: Secondary | ICD-10-CM | POA: Diagnosis not present

## 2017-06-23 DIAGNOSIS — D509 Iron deficiency anemia, unspecified: Secondary | ICD-10-CM | POA: Diagnosis not present

## 2017-06-23 DIAGNOSIS — N186 End stage renal disease: Secondary | ICD-10-CM | POA: Diagnosis not present

## 2017-06-23 DIAGNOSIS — D631 Anemia in chronic kidney disease: Secondary | ICD-10-CM | POA: Diagnosis not present

## 2017-06-26 DIAGNOSIS — N186 End stage renal disease: Secondary | ICD-10-CM | POA: Diagnosis not present

## 2017-06-26 DIAGNOSIS — Z23 Encounter for immunization: Secondary | ICD-10-CM | POA: Diagnosis not present

## 2017-06-26 DIAGNOSIS — N2581 Secondary hyperparathyroidism of renal origin: Secondary | ICD-10-CM | POA: Diagnosis not present

## 2017-06-26 DIAGNOSIS — D689 Coagulation defect, unspecified: Secondary | ICD-10-CM | POA: Diagnosis not present

## 2017-06-26 DIAGNOSIS — D509 Iron deficiency anemia, unspecified: Secondary | ICD-10-CM | POA: Diagnosis not present

## 2017-06-26 DIAGNOSIS — D631 Anemia in chronic kidney disease: Secondary | ICD-10-CM | POA: Diagnosis not present

## 2017-06-28 DIAGNOSIS — N2581 Secondary hyperparathyroidism of renal origin: Secondary | ICD-10-CM | POA: Diagnosis not present

## 2017-06-28 DIAGNOSIS — D689 Coagulation defect, unspecified: Secondary | ICD-10-CM | POA: Diagnosis not present

## 2017-06-28 DIAGNOSIS — D631 Anemia in chronic kidney disease: Secondary | ICD-10-CM | POA: Diagnosis not present

## 2017-06-28 DIAGNOSIS — N186 End stage renal disease: Secondary | ICD-10-CM | POA: Diagnosis not present

## 2017-06-28 DIAGNOSIS — D509 Iron deficiency anemia, unspecified: Secondary | ICD-10-CM | POA: Diagnosis not present

## 2017-06-28 DIAGNOSIS — Z23 Encounter for immunization: Secondary | ICD-10-CM | POA: Diagnosis not present

## 2017-06-30 DIAGNOSIS — N186 End stage renal disease: Secondary | ICD-10-CM | POA: Diagnosis not present

## 2017-06-30 DIAGNOSIS — D509 Iron deficiency anemia, unspecified: Secondary | ICD-10-CM | POA: Diagnosis not present

## 2017-06-30 DIAGNOSIS — N2581 Secondary hyperparathyroidism of renal origin: Secondary | ICD-10-CM | POA: Diagnosis not present

## 2017-06-30 DIAGNOSIS — Z992 Dependence on renal dialysis: Secondary | ICD-10-CM | POA: Diagnosis not present

## 2017-06-30 DIAGNOSIS — D631 Anemia in chronic kidney disease: Secondary | ICD-10-CM | POA: Diagnosis not present

## 2017-06-30 DIAGNOSIS — Z23 Encounter for immunization: Secondary | ICD-10-CM | POA: Diagnosis not present

## 2017-06-30 DIAGNOSIS — D689 Coagulation defect, unspecified: Secondary | ICD-10-CM | POA: Diagnosis not present

## 2017-06-30 DIAGNOSIS — E1122 Type 2 diabetes mellitus with diabetic chronic kidney disease: Secondary | ICD-10-CM | POA: Diagnosis not present

## 2017-07-03 DIAGNOSIS — N2581 Secondary hyperparathyroidism of renal origin: Secondary | ICD-10-CM | POA: Diagnosis not present

## 2017-07-03 DIAGNOSIS — N186 End stage renal disease: Secondary | ICD-10-CM | POA: Diagnosis not present

## 2017-07-05 DIAGNOSIS — N186 End stage renal disease: Secondary | ICD-10-CM | POA: Diagnosis not present

## 2017-07-05 DIAGNOSIS — N2581 Secondary hyperparathyroidism of renal origin: Secondary | ICD-10-CM | POA: Diagnosis not present

## 2017-07-06 ENCOUNTER — Other Ambulatory Visit: Payer: Self-pay | Admitting: Family Medicine

## 2017-07-06 DIAGNOSIS — N184 Chronic kidney disease, stage 4 (severe): Secondary | ICD-10-CM

## 2017-07-07 ENCOUNTER — Other Ambulatory Visit: Payer: Self-pay

## 2017-07-07 DIAGNOSIS — N184 Chronic kidney disease, stage 4 (severe): Secondary | ICD-10-CM

## 2017-07-07 DIAGNOSIS — D689 Coagulation defect, unspecified: Secondary | ICD-10-CM | POA: Diagnosis not present

## 2017-07-07 DIAGNOSIS — N186 End stage renal disease: Secondary | ICD-10-CM | POA: Diagnosis not present

## 2017-07-07 DIAGNOSIS — N2581 Secondary hyperparathyroidism of renal origin: Secondary | ICD-10-CM | POA: Diagnosis not present

## 2017-07-07 DIAGNOSIS — D509 Iron deficiency anemia, unspecified: Secondary | ICD-10-CM | POA: Diagnosis not present

## 2017-07-07 DIAGNOSIS — D631 Anemia in chronic kidney disease: Secondary | ICD-10-CM | POA: Diagnosis not present

## 2017-07-07 MED ORDER — ISOSORBIDE MONONITRATE ER 30 MG PO TB24: 30.0000 mg | ORAL_TABLET | Freq: Two times a day (BID) | ORAL | 1 refills | Status: DC

## 2017-07-07 NOTE — Telephone Encounter (Signed)
Faxed 30d+1 of Isosorbide to [facility] till OV/thx dmf

## 2017-07-07 NOTE — Telephone Encounter (Signed)
Already faxed 30d+1/thx dmf

## 2017-07-10 DIAGNOSIS — D631 Anemia in chronic kidney disease: Secondary | ICD-10-CM | POA: Diagnosis not present

## 2017-07-10 DIAGNOSIS — N186 End stage renal disease: Secondary | ICD-10-CM | POA: Diagnosis not present

## 2017-07-10 DIAGNOSIS — N2581 Secondary hyperparathyroidism of renal origin: Secondary | ICD-10-CM | POA: Diagnosis not present

## 2017-07-10 DIAGNOSIS — D509 Iron deficiency anemia, unspecified: Secondary | ICD-10-CM | POA: Diagnosis not present

## 2017-07-10 DIAGNOSIS — D689 Coagulation defect, unspecified: Secondary | ICD-10-CM | POA: Diagnosis not present

## 2017-07-12 DIAGNOSIS — D631 Anemia in chronic kidney disease: Secondary | ICD-10-CM | POA: Diagnosis not present

## 2017-07-12 DIAGNOSIS — N186 End stage renal disease: Secondary | ICD-10-CM | POA: Diagnosis not present

## 2017-07-12 DIAGNOSIS — N2581 Secondary hyperparathyroidism of renal origin: Secondary | ICD-10-CM | POA: Diagnosis not present

## 2017-07-12 DIAGNOSIS — D689 Coagulation defect, unspecified: Secondary | ICD-10-CM | POA: Diagnosis not present

## 2017-07-12 DIAGNOSIS — D509 Iron deficiency anemia, unspecified: Secondary | ICD-10-CM | POA: Diagnosis not present

## 2017-07-14 DIAGNOSIS — D689 Coagulation defect, unspecified: Secondary | ICD-10-CM | POA: Diagnosis not present

## 2017-07-14 DIAGNOSIS — N186 End stage renal disease: Secondary | ICD-10-CM | POA: Diagnosis not present

## 2017-07-14 DIAGNOSIS — N2581 Secondary hyperparathyroidism of renal origin: Secondary | ICD-10-CM | POA: Diagnosis not present

## 2017-07-14 DIAGNOSIS — D509 Iron deficiency anemia, unspecified: Secondary | ICD-10-CM | POA: Diagnosis not present

## 2017-07-14 DIAGNOSIS — D631 Anemia in chronic kidney disease: Secondary | ICD-10-CM | POA: Diagnosis not present

## 2017-07-17 DIAGNOSIS — D689 Coagulation defect, unspecified: Secondary | ICD-10-CM | POA: Diagnosis not present

## 2017-07-17 DIAGNOSIS — D631 Anemia in chronic kidney disease: Secondary | ICD-10-CM | POA: Diagnosis not present

## 2017-07-17 DIAGNOSIS — D509 Iron deficiency anemia, unspecified: Secondary | ICD-10-CM | POA: Diagnosis not present

## 2017-07-17 DIAGNOSIS — N2581 Secondary hyperparathyroidism of renal origin: Secondary | ICD-10-CM | POA: Diagnosis not present

## 2017-07-17 DIAGNOSIS — N186 End stage renal disease: Secondary | ICD-10-CM | POA: Diagnosis not present

## 2017-07-19 DIAGNOSIS — N186 End stage renal disease: Secondary | ICD-10-CM | POA: Diagnosis not present

## 2017-07-19 DIAGNOSIS — D509 Iron deficiency anemia, unspecified: Secondary | ICD-10-CM | POA: Diagnosis not present

## 2017-07-19 DIAGNOSIS — D631 Anemia in chronic kidney disease: Secondary | ICD-10-CM | POA: Diagnosis not present

## 2017-07-19 DIAGNOSIS — D689 Coagulation defect, unspecified: Secondary | ICD-10-CM | POA: Diagnosis not present

## 2017-07-19 DIAGNOSIS — N2581 Secondary hyperparathyroidism of renal origin: Secondary | ICD-10-CM | POA: Diagnosis not present

## 2017-07-20 DIAGNOSIS — D689 Coagulation defect, unspecified: Secondary | ICD-10-CM | POA: Diagnosis not present

## 2017-07-20 DIAGNOSIS — N186 End stage renal disease: Secondary | ICD-10-CM | POA: Diagnosis not present

## 2017-07-20 DIAGNOSIS — N2581 Secondary hyperparathyroidism of renal origin: Secondary | ICD-10-CM | POA: Diagnosis not present

## 2017-07-20 DIAGNOSIS — D509 Iron deficiency anemia, unspecified: Secondary | ICD-10-CM | POA: Diagnosis not present

## 2017-07-20 DIAGNOSIS — D631 Anemia in chronic kidney disease: Secondary | ICD-10-CM | POA: Diagnosis not present

## 2017-07-21 ENCOUNTER — Telehealth: Payer: Self-pay | Admitting: Family Medicine

## 2017-07-21 DIAGNOSIS — N186 End stage renal disease: Secondary | ICD-10-CM | POA: Diagnosis not present

## 2017-07-21 DIAGNOSIS — D689 Coagulation defect, unspecified: Secondary | ICD-10-CM | POA: Diagnosis not present

## 2017-07-21 DIAGNOSIS — D509 Iron deficiency anemia, unspecified: Secondary | ICD-10-CM | POA: Diagnosis not present

## 2017-07-21 DIAGNOSIS — N2581 Secondary hyperparathyroidism of renal origin: Secondary | ICD-10-CM | POA: Diagnosis not present

## 2017-07-21 DIAGNOSIS — D631 Anemia in chronic kidney disease: Secondary | ICD-10-CM | POA: Diagnosis not present

## 2017-07-21 NOTE — Telephone Encounter (Signed)
Pt family brought DMV handicap placard app for [employer] to sign. Call when ready to be picked up [phone]. Placed in provider tray at front desk checkin area.

## 2017-07-24 DIAGNOSIS — D631 Anemia in chronic kidney disease: Secondary | ICD-10-CM | POA: Diagnosis not present

## 2017-07-24 DIAGNOSIS — N186 End stage renal disease: Secondary | ICD-10-CM | POA: Diagnosis not present

## 2017-07-24 DIAGNOSIS — D509 Iron deficiency anemia, unspecified: Secondary | ICD-10-CM | POA: Diagnosis not present

## 2017-07-24 DIAGNOSIS — N2581 Secondary hyperparathyroidism of renal origin: Secondary | ICD-10-CM | POA: Diagnosis not present

## 2017-07-24 DIAGNOSIS — D689 Coagulation defect, unspecified: Secondary | ICD-10-CM | POA: Diagnosis not present

## 2017-07-24 NOTE — Telephone Encounter (Signed)
Received Application and Renewal of Disability Parking Placard form [place] [employer]; forward to provider/SLS 09/24

## 2017-07-26 DIAGNOSIS — D631 Anemia in chronic kidney disease: Secondary | ICD-10-CM | POA: Diagnosis not present

## 2017-07-26 DIAGNOSIS — D509 Iron deficiency anemia, unspecified: Secondary | ICD-10-CM | POA: Diagnosis not present

## 2017-07-26 DIAGNOSIS — N186 End stage renal disease: Secondary | ICD-10-CM | POA: Diagnosis not present

## 2017-07-26 DIAGNOSIS — D689 Coagulation defect, unspecified: Secondary | ICD-10-CM | POA: Diagnosis not present

## 2017-07-26 DIAGNOSIS — N2581 Secondary hyperparathyroidism of renal origin: Secondary | ICD-10-CM | POA: Diagnosis not present

## 2017-07-27 DIAGNOSIS — I871 Compression of vein: Secondary | ICD-10-CM | POA: Diagnosis not present

## 2017-07-27 DIAGNOSIS — Z992 Dependence on renal dialysis: Secondary | ICD-10-CM | POA: Diagnosis not present

## 2017-07-27 DIAGNOSIS — T82858A Stenosis of vascular prosthetic devices, implants and grafts, initial encounter: Secondary | ICD-10-CM | POA: Diagnosis not present

## 2017-07-27 DIAGNOSIS — N186 End stage renal disease: Secondary | ICD-10-CM | POA: Diagnosis not present

## 2017-07-28 DIAGNOSIS — D509 Iron deficiency anemia, unspecified: Secondary | ICD-10-CM | POA: Diagnosis not present

## 2017-07-28 DIAGNOSIS — N2581 Secondary hyperparathyroidism of renal origin: Secondary | ICD-10-CM | POA: Diagnosis not present

## 2017-07-28 DIAGNOSIS — N186 End stage renal disease: Secondary | ICD-10-CM | POA: Diagnosis not present

## 2017-07-28 DIAGNOSIS — D689 Coagulation defect, unspecified: Secondary | ICD-10-CM | POA: Diagnosis not present

## 2017-07-28 DIAGNOSIS — D631 Anemia in chronic kidney disease: Secondary | ICD-10-CM | POA: Diagnosis not present

## 2017-07-30 DIAGNOSIS — N186 End stage renal disease: Secondary | ICD-10-CM | POA: Diagnosis not present

## 2017-07-30 DIAGNOSIS — E1122 Type 2 diabetes mellitus with diabetic chronic kidney disease: Secondary | ICD-10-CM | POA: Diagnosis not present

## 2017-07-30 DIAGNOSIS — Z992 Dependence on renal dialysis: Secondary | ICD-10-CM | POA: Diagnosis not present

## 2017-07-31 DIAGNOSIS — D689 Coagulation defect, unspecified: Secondary | ICD-10-CM | POA: Diagnosis not present

## 2017-07-31 DIAGNOSIS — N186 End stage renal disease: Secondary | ICD-10-CM | POA: Diagnosis not present

## 2017-07-31 DIAGNOSIS — E119 Type 2 diabetes mellitus without complications: Secondary | ICD-10-CM | POA: Diagnosis not present

## 2017-07-31 DIAGNOSIS — N2581 Secondary hyperparathyroidism of renal origin: Secondary | ICD-10-CM | POA: Diagnosis not present

## 2017-07-31 DIAGNOSIS — D631 Anemia in chronic kidney disease: Secondary | ICD-10-CM | POA: Diagnosis not present

## 2017-07-31 DIAGNOSIS — D509 Iron deficiency anemia, unspecified: Secondary | ICD-10-CM | POA: Diagnosis not present

## 2017-08-02 DIAGNOSIS — E119 Type 2 diabetes mellitus without complications: Secondary | ICD-10-CM | POA: Diagnosis not present

## 2017-08-02 DIAGNOSIS — D509 Iron deficiency anemia, unspecified: Secondary | ICD-10-CM | POA: Diagnosis not present

## 2017-08-02 DIAGNOSIS — D689 Coagulation defect, unspecified: Secondary | ICD-10-CM | POA: Diagnosis not present

## 2017-08-02 DIAGNOSIS — N186 End stage renal disease: Secondary | ICD-10-CM | POA: Diagnosis not present

## 2017-08-02 DIAGNOSIS — N2581 Secondary hyperparathyroidism of renal origin: Secondary | ICD-10-CM | POA: Diagnosis not present

## 2017-08-02 DIAGNOSIS — D631 Anemia in chronic kidney disease: Secondary | ICD-10-CM | POA: Diagnosis not present

## 2017-08-04 DIAGNOSIS — D509 Iron deficiency anemia, unspecified: Secondary | ICD-10-CM | POA: Diagnosis not present

## 2017-08-04 DIAGNOSIS — E119 Type 2 diabetes mellitus without complications: Secondary | ICD-10-CM | POA: Diagnosis not present

## 2017-08-04 DIAGNOSIS — D631 Anemia in chronic kidney disease: Secondary | ICD-10-CM | POA: Diagnosis not present

## 2017-08-04 DIAGNOSIS — D689 Coagulation defect, unspecified: Secondary | ICD-10-CM | POA: Diagnosis not present

## 2017-08-04 DIAGNOSIS — N2581 Secondary hyperparathyroidism of renal origin: Secondary | ICD-10-CM | POA: Diagnosis not present

## 2017-08-04 DIAGNOSIS — N186 End stage renal disease: Secondary | ICD-10-CM | POA: Diagnosis not present

## 2017-08-07 DIAGNOSIS — N2581 Secondary hyperparathyroidism of renal origin: Secondary | ICD-10-CM | POA: Diagnosis not present

## 2017-08-07 DIAGNOSIS — D631 Anemia in chronic kidney disease: Secondary | ICD-10-CM | POA: Diagnosis not present

## 2017-08-07 DIAGNOSIS — D689 Coagulation defect, unspecified: Secondary | ICD-10-CM | POA: Diagnosis not present

## 2017-08-07 DIAGNOSIS — N186 End stage renal disease: Secondary | ICD-10-CM | POA: Diagnosis not present

## 2017-08-07 DIAGNOSIS — E119 Type 2 diabetes mellitus without complications: Secondary | ICD-10-CM | POA: Diagnosis not present

## 2017-08-07 DIAGNOSIS — D509 Iron deficiency anemia, unspecified: Secondary | ICD-10-CM | POA: Diagnosis not present

## 2017-08-09 DIAGNOSIS — D689 Coagulation defect, unspecified: Secondary | ICD-10-CM | POA: Diagnosis not present

## 2017-08-09 DIAGNOSIS — N186 End stage renal disease: Secondary | ICD-10-CM | POA: Diagnosis not present

## 2017-08-09 DIAGNOSIS — E119 Type 2 diabetes mellitus without complications: Secondary | ICD-10-CM | POA: Diagnosis not present

## 2017-08-09 DIAGNOSIS — N2581 Secondary hyperparathyroidism of renal origin: Secondary | ICD-10-CM | POA: Diagnosis not present

## 2017-08-09 DIAGNOSIS — D631 Anemia in chronic kidney disease: Secondary | ICD-10-CM | POA: Diagnosis not present

## 2017-08-09 DIAGNOSIS — D509 Iron deficiency anemia, unspecified: Secondary | ICD-10-CM | POA: Diagnosis not present

## 2017-08-11 DIAGNOSIS — N2581 Secondary hyperparathyroidism of renal origin: Secondary | ICD-10-CM | POA: Diagnosis not present

## 2017-08-11 DIAGNOSIS — D509 Iron deficiency anemia, unspecified: Secondary | ICD-10-CM | POA: Diagnosis not present

## 2017-08-11 DIAGNOSIS — E119 Type 2 diabetes mellitus without complications: Secondary | ICD-10-CM | POA: Diagnosis not present

## 2017-08-11 DIAGNOSIS — D631 Anemia in chronic kidney disease: Secondary | ICD-10-CM | POA: Diagnosis not present

## 2017-08-11 DIAGNOSIS — D689 Coagulation defect, unspecified: Secondary | ICD-10-CM | POA: Diagnosis not present

## 2017-08-11 DIAGNOSIS — N186 End stage renal disease: Secondary | ICD-10-CM | POA: Diagnosis not present

## 2017-08-14 DIAGNOSIS — N2581 Secondary hyperparathyroidism of renal origin: Secondary | ICD-10-CM | POA: Diagnosis not present

## 2017-08-14 DIAGNOSIS — E119 Type 2 diabetes mellitus without complications: Secondary | ICD-10-CM | POA: Diagnosis not present

## 2017-08-14 DIAGNOSIS — D509 Iron deficiency anemia, unspecified: Secondary | ICD-10-CM | POA: Diagnosis not present

## 2017-08-14 DIAGNOSIS — D631 Anemia in chronic kidney disease: Secondary | ICD-10-CM | POA: Diagnosis not present

## 2017-08-14 DIAGNOSIS — N186 End stage renal disease: Secondary | ICD-10-CM | POA: Diagnosis not present

## 2017-08-14 DIAGNOSIS — D689 Coagulation defect, unspecified: Secondary | ICD-10-CM | POA: Diagnosis not present

## 2017-08-15 ENCOUNTER — Emergency Department (HOSPITAL_COMMUNITY)
Admission: EM | Admit: 2017-08-15 | Discharge: 2017-08-15 | Discharge status: Against Medical Advice | Payer: Medicare HMO | Source: Home / Self Care

## 2017-08-15 ENCOUNTER — Encounter (HOSPITAL_COMMUNITY): Payer: Self-pay | Admitting: *Deleted

## 2017-08-15 DIAGNOSIS — Z5321 Procedure and treatment not carried out due to patient leaving prior to being seen by health care provider: Secondary | ICD-10-CM | POA: Insufficient documentation

## 2017-08-15 DIAGNOSIS — I120 Hypertensive chronic kidney disease with stage 5 chronic kidney disease or end stage renal disease: Secondary | ICD-10-CM | POA: Diagnosis not present

## 2017-08-15 DIAGNOSIS — I132 Hypertensive heart and chronic kidney disease with heart failure and with stage 5 chronic kidney disease, or end stage renal disease: Secondary | ICD-10-CM | POA: Diagnosis present

## 2017-08-15 DIAGNOSIS — A084 Viral intestinal infection, unspecified: Secondary | ICD-10-CM | POA: Diagnosis present

## 2017-08-15 DIAGNOSIS — J810 Acute pulmonary edema: Secondary | ICD-10-CM | POA: Diagnosis not present

## 2017-08-15 DIAGNOSIS — I10 Essential (primary) hypertension: Secondary | ICD-10-CM | POA: Diagnosis not present

## 2017-08-15 DIAGNOSIS — R1030 Lower abdominal pain, unspecified: Secondary | ICD-10-CM | POA: Insufficient documentation

## 2017-08-15 DIAGNOSIS — N186 End stage renal disease: Secondary | ICD-10-CM | POA: Diagnosis present

## 2017-08-15 DIAGNOSIS — J9601 Acute respiratory failure with hypoxia: Secondary | ICD-10-CM | POA: Diagnosis present

## 2017-08-15 DIAGNOSIS — E1022 Type 1 diabetes mellitus with diabetic chronic kidney disease: Secondary | ICD-10-CM | POA: Diagnosis present

## 2017-08-15 DIAGNOSIS — R9431 Abnormal electrocardiogram [ECG] [EKG]: Secondary | ICD-10-CM | POA: Diagnosis not present

## 2017-08-15 DIAGNOSIS — Z9115 Patient's noncompliance with renal dialysis: Secondary | ICD-10-CM | POA: Diagnosis not present

## 2017-08-15 DIAGNOSIS — D631 Anemia in chronic kidney disease: Secondary | ICD-10-CM | POA: Diagnosis present

## 2017-08-15 DIAGNOSIS — M8580 Other specified disorders of bone density and structure, unspecified site: Secondary | ICD-10-CM | POA: Diagnosis present

## 2017-08-15 DIAGNOSIS — Z992 Dependence on renal dialysis: Secondary | ICD-10-CM | POA: Diagnosis not present

## 2017-08-15 DIAGNOSIS — I5032 Chronic diastolic (congestive) heart failure: Secondary | ICD-10-CM | POA: Diagnosis present

## 2017-08-15 DIAGNOSIS — E8779 Other fluid overload: Secondary | ICD-10-CM | POA: Diagnosis present

## 2017-08-15 DIAGNOSIS — E785 Hyperlipidemia, unspecified: Secondary | ICD-10-CM | POA: Diagnosis present

## 2017-08-15 DIAGNOSIS — R0602 Shortness of breath: Secondary | ICD-10-CM | POA: Diagnosis not present

## 2017-08-15 DIAGNOSIS — K219 Gastro-esophageal reflux disease without esophagitis: Secondary | ICD-10-CM | POA: Diagnosis present

## 2017-08-15 DIAGNOSIS — R0902 Hypoxemia: Secondary | ICD-10-CM | POA: Diagnosis present

## 2017-08-15 DIAGNOSIS — H409 Unspecified glaucoma: Secondary | ICD-10-CM | POA: Diagnosis present

## 2017-08-15 DIAGNOSIS — Z794 Long term (current) use of insulin: Secondary | ICD-10-CM | POA: Diagnosis not present

## 2017-08-15 LAB — CBC
HEMATOCRIT: 26.3 % — AB (ref 36.0–46.0)
Hemoglobin: 8.4 g/dL — ABNORMAL LOW (ref 12.0–15.0)
MCH: 31.3 pg (ref 26.0–34.0)
MCHC: 31.9 g/dL (ref 30.0–36.0)
MCV: 98.1 fL (ref 78.0–100.0)
Platelets: 96 10*3/uL — ABNORMAL LOW (ref 150–400)
RBC: 2.68 MIL/uL — AB (ref 3.87–5.11)
RDW: 18.4 % — AB (ref 11.5–15.5)
WBC: 4.5 10*3/uL (ref 4.0–10.5)

## 2017-08-15 LAB — COMPREHENSIVE METABOLIC PANEL
ALT: 24 U/L (ref 14–54)
ANION GAP: 13 (ref 5–15)
AST: 40 U/L (ref 15–41)
Albumin: 3.5 g/dL (ref 3.5–5.0)
Alkaline Phosphatase: 102 U/L (ref 38–126)
BILIRUBIN TOTAL: 0.9 mg/dL (ref 0.3–1.2)
BUN: 36 mg/dL — AB (ref 6–20)
CO2: 28 mmol/L (ref 22–32)
Calcium: 8.5 mg/dL — ABNORMAL LOW (ref 8.9–10.3)
Chloride: 99 mmol/L — ABNORMAL LOW (ref 101–111)
Creatinine, Ser: 5.59 mg/dL — ABNORMAL HIGH (ref 0.44–1.00)
GFR, EST AFRICAN AMERICAN: 8 mL/min — AB (ref >60)
GFR, EST NON AFRICAN AMERICAN: 7 mL/min — AB (ref >60)
Glucose, Bld: 103 mg/dL — ABNORMAL HIGH (ref 65–99)
Potassium: 4.2 mmol/L (ref 3.5–5.1)
Sodium: 140 mmol/L (ref 135–145)
TOTAL PROTEIN: 7.4 g/dL (ref 6.5–8.1)

## 2017-08-15 LAB — LIPASE, BLOOD: Lipase: 28 U/L (ref 11–51)

## 2017-08-15 NOTE — ED Triage Notes (Signed)
Pt reports lower abd pain x 1 day.  Pt reports diarrhea x 1 day.  Pt denies nausea/vomiting.  Pt reports coughing and congestion. Pt currently reports lower abd discomfort. Pt a/o x 4.

## 2017-08-15 NOTE — ED Triage Notes (Signed)
Pt told registration she was leaving.

## 2017-08-15 NOTE — ED Notes (Signed)
PT request to clean self up before starting triage. PT unable to make to restroom. PT currently in triage restroom

## 2017-08-17 ENCOUNTER — Inpatient Hospital Stay (HOSPITAL_COMMUNITY)
Admission: EM | Admit: 2017-08-17 | Discharge: 2017-08-20 | DRG: 640 | Disposition: A | Discharge status: Home / Self Care | Payer: Medicare HMO | Attending: Internal Medicine | Admitting: Internal Medicine

## 2017-08-17 ENCOUNTER — Encounter: Payer: Medicare HMO | Admitting: Family Medicine

## 2017-08-17 ENCOUNTER — Encounter (HOSPITAL_COMMUNITY): Payer: Self-pay

## 2017-08-17 ENCOUNTER — Emergency Department (HOSPITAL_COMMUNITY): Payer: Medicare HMO

## 2017-08-17 DIAGNOSIS — I10 Essential (primary) hypertension: Secondary | ICD-10-CM | POA: Diagnosis present

## 2017-08-17 DIAGNOSIS — R0902 Hypoxemia: Secondary | ICD-10-CM

## 2017-08-17 DIAGNOSIS — M8580 Other specified disorders of bone density and structure, unspecified site: Secondary | ICD-10-CM | POA: Diagnosis present

## 2017-08-17 DIAGNOSIS — I132 Hypertensive heart and chronic kidney disease with heart failure and with stage 5 chronic kidney disease, or end stage renal disease: Secondary | ICD-10-CM | POA: Diagnosis present

## 2017-08-17 DIAGNOSIS — E8779 Other fluid overload: Principal | ICD-10-CM | POA: Diagnosis present

## 2017-08-17 DIAGNOSIS — Z992 Dependence on renal dialysis: Secondary | ICD-10-CM

## 2017-08-17 DIAGNOSIS — H409 Unspecified glaucoma: Secondary | ICD-10-CM | POA: Diagnosis present

## 2017-08-17 DIAGNOSIS — J811 Chronic pulmonary edema: Secondary | ICD-10-CM | POA: Diagnosis present

## 2017-08-17 DIAGNOSIS — Z9115 Patient's noncompliance with renal dialysis: Secondary | ICD-10-CM

## 2017-08-17 DIAGNOSIS — A084 Viral intestinal infection, unspecified: Secondary | ICD-10-CM | POA: Diagnosis present

## 2017-08-17 DIAGNOSIS — I5032 Chronic diastolic (congestive) heart failure: Secondary | ICD-10-CM | POA: Diagnosis not present

## 2017-08-17 DIAGNOSIS — D631 Anemia in chronic kidney disease: Secondary | ICD-10-CM | POA: Diagnosis present

## 2017-08-17 DIAGNOSIS — J810 Acute pulmonary edema: Secondary | ICD-10-CM

## 2017-08-17 DIAGNOSIS — J9601 Acute respiratory failure with hypoxia: Secondary | ICD-10-CM | POA: Diagnosis present

## 2017-08-17 DIAGNOSIS — E8770 Fluid overload, unspecified: Secondary | ICD-10-CM | POA: Diagnosis present

## 2017-08-17 DIAGNOSIS — N186 End stage renal disease: Secondary | ICD-10-CM | POA: Diagnosis present

## 2017-08-17 DIAGNOSIS — J9691 Respiratory failure, unspecified with hypoxia: Secondary | ICD-10-CM | POA: Diagnosis present

## 2017-08-17 DIAGNOSIS — E785 Hyperlipidemia, unspecified: Secondary | ICD-10-CM | POA: Diagnosis present

## 2017-08-17 DIAGNOSIS — E1022 Type 1 diabetes mellitus with diabetic chronic kidney disease: Secondary | ICD-10-CM | POA: Diagnosis present

## 2017-08-17 DIAGNOSIS — N184 Chronic kidney disease, stage 4 (severe): Secondary | ICD-10-CM | POA: Diagnosis present

## 2017-08-17 DIAGNOSIS — K219 Gastro-esophageal reflux disease without esophagitis: Secondary | ICD-10-CM | POA: Diagnosis present

## 2017-08-17 DIAGNOSIS — Z794 Long term (current) use of insulin: Secondary | ICD-10-CM

## 2017-08-17 LAB — CBC WITH DIFFERENTIAL/PLATELET
BASOS ABS: 0 10*3/uL (ref 0.0–0.1)
Basophils Relative: 1 %
EOS PCT: 2 %
Eosinophils Absolute: 0.1 10*3/uL (ref 0.0–0.7)
HCT: 27 % — ABNORMAL LOW (ref 36.0–46.0)
Hemoglobin: 8.6 g/dL — ABNORMAL LOW (ref 12.0–15.0)
LYMPHS PCT: 12 %
Lymphs Abs: 0.6 10*3/uL — ABNORMAL LOW (ref 0.7–4.0)
MCH: 30.7 pg (ref 26.0–34.0)
MCHC: 31.9 g/dL (ref 30.0–36.0)
MCV: 96.4 fL (ref 78.0–100.0)
MONO ABS: 0.2 10*3/uL (ref 0.1–1.0)
Monocytes Relative: 5 %
Neutro Abs: 4 10*3/uL (ref 1.7–7.7)
Neutrophils Relative %: 81 %
PLATELETS: 104 10*3/uL — AB (ref 150–400)
RBC: 2.8 MIL/uL — ABNORMAL LOW (ref 3.87–5.11)
RDW: 17.8 % — ABNORMAL HIGH (ref 11.5–15.5)
WBC: 4.9 10*3/uL (ref 4.0–10.5)

## 2017-08-17 LAB — POC OCCULT BLOOD, ED: Fecal Occult Bld: POSITIVE — AB

## 2017-08-17 LAB — BASIC METABOLIC PANEL
Anion gap: 15 (ref 5–15)
BUN: 57 mg/dL — AB (ref 6–20)
CO2: 24 mmol/L (ref 22–32)
Calcium: 8.4 mg/dL — ABNORMAL LOW (ref 8.9–10.3)
Chloride: 100 mmol/L — ABNORMAL LOW (ref 101–111)
Creatinine, Ser: 7.53 mg/dL — ABNORMAL HIGH (ref 0.44–1.00)
GFR calc Af Amer: 5 mL/min — ABNORMAL LOW (ref >60)
GFR, EST NON AFRICAN AMERICAN: 5 mL/min — AB (ref >60)
GLUCOSE: 106 mg/dL — AB (ref 65–99)
POTASSIUM: 4.8 mmol/L (ref 3.5–5.1)
Sodium: 139 mmol/L (ref 135–145)

## 2017-08-17 LAB — INFLUENZA PANEL BY PCR (TYPE A & B)
INFLBPCR: NEGATIVE
Influenza A By PCR: NEGATIVE

## 2017-08-17 MED ORDER — ACETAMINOPHEN 650 MG RE SUPP: 650.0000 mg | Freq: Four times a day (QID) | RECTAL | Status: DC | PRN

## 2017-08-17 MED ORDER — ONDANSETRON HCL 4 MG PO TABS: 4.0000 mg | ORAL_TABLET | Freq: Four times a day (QID) | ORAL | Status: DC | PRN

## 2017-08-17 MED ORDER — ACETAMINOPHEN 325 MG PO TABS
650.0000 mg | ORAL_TABLET | Freq: Four times a day (QID) | ORAL | Status: DC | PRN
  Administered 2017-08-18 – 2017-08-19 (×3): 650 mg via ORAL
  Filled 2017-08-17 (×2): qty 2

## 2017-08-17 MED ORDER — SODIUM CHLORIDE 0.9 % IV SOLN: 250.0000 mL | INTRAVENOUS | Status: DC | PRN

## 2017-08-17 MED ORDER — FUROSEMIDE 40 MG PO TABS
40.0000 mg | ORAL_TABLET | Freq: Every day | ORAL | Status: DC
  Administered 2017-08-17 – 2017-08-20 (×4): 40 mg via ORAL
  Filled 2017-08-17 (×4): qty 1

## 2017-08-17 MED ORDER — ONDANSETRON HCL 4 MG/2ML IJ SOLN: 4.0000 mg | Freq: Four times a day (QID) | INTRAMUSCULAR | Status: DC | PRN

## 2017-08-17 MED ORDER — AMLODIPINE BESYLATE 10 MG PO TABS
10.0000 mg | ORAL_TABLET | Freq: Every day | ORAL | Status: DC
  Administered 2017-08-17 – 2017-08-20 (×4): 10 mg via ORAL
  Filled 2017-08-17: qty 2
  Filled 2017-08-17 (×3): qty 1

## 2017-08-17 MED ORDER — SODIUM CHLORIDE 0.9% FLUSH
3.0000 mL | Freq: Two times a day (BID) | INTRAVENOUS | Status: DC
  Administered 2017-08-17 – 2017-08-20 (×7): 3 mL via INTRAVENOUS

## 2017-08-17 MED ORDER — CARVEDILOL 25 MG PO TABS
25.0000 mg | ORAL_TABLET | Freq: Two times a day (BID) | ORAL | Status: DC
  Administered 2017-08-17 – 2017-08-20 (×5): 25 mg via ORAL
  Filled 2017-08-17 (×7): qty 1

## 2017-08-17 MED ORDER — SODIUM CHLORIDE 0.9% FLUSH
3.0000 mL | INTRAVENOUS | Status: DC | PRN
  Administered 2017-08-18 (×2): 3 mL via INTRAVENOUS
  Filled 2017-08-17 (×2): qty 3

## 2017-08-17 MED ORDER — LISINOPRIL 40 MG PO TABS
40.0000 mg | ORAL_TABLET | Freq: Every day | ORAL | Status: DC
  Administered 2017-08-17 – 2017-08-19 (×3): 40 mg via ORAL
  Filled 2017-08-17: qty 1
  Filled 2017-08-17: qty 2
  Filled 2017-08-17 (×2): qty 1

## 2017-08-17 MED ORDER — ISOSORBIDE MONONITRATE ER 30 MG PO TB24
30.0000 mg | ORAL_TABLET | Freq: Two times a day (BID) | ORAL | Status: DC
  Administered 2017-08-17 – 2017-08-20 (×6): 30 mg via ORAL
  Filled 2017-08-17 (×7): qty 1

## 2017-08-17 NOTE — ED Notes (Signed)
Attempted to call report to 2W at [facility].  RN will call this RN back.

## 2017-08-17 NOTE — Consult Note (Signed)
Renal Service Consult Note [facility]  [name] [name] 08/17/2017 [place] Requesting Physician:  Dr Shanon Brow   Reason for Consult: ESRD pt with SOB, cough, diarrhea/ abd pain HPI: The patient is a 76 y.o. year-old w/ hx of DM1, hep B, HL, HTN, ESRD on HD MWF presented to ED with abd pain/ diarrhea x 1 days, and cough/ SOB/ congestion for the last 2-3 days.  She was hypoxic w/ SpO2 88% in ED and improved with nasal O2.  CXR showed CHF +/- basilar infiltrates/ effusions. Asked to see for ESRD.   Patient says has had cough, dry, and orthopnea / SOB worsening for the last 2 days.  No feves, no prod cough, no chest pain.  Then developed diarrhea and abd cramping last 24 hrs.  Hasn't missed any dialysis.   ROS  denies CP  no joint pain   no HA  no blurry vision  no rash   Past Medical History  Past Medical History:  Diagnosis Date  . DEPRESSION   . DIABETES MELLITUS, TYPE I   . GERD   . GLAUCOMA   . Headache(784.0)   . Hepatitis B carrier ([place])    05/2009: neg Hep C; Hep B: core pos, Surf neg; fatty liver US 8/10 - 7/13  . HYPERLIPIDEMIA   . HYPERTENSION   . Kidney failure    dialysis 3 times per week  . OSTEOPENIA   . POSTMENOPAUSAL STATUS   . Unspecified vitamin D deficiency    Past Surgical History  Past Surgical History:  Procedure Laterality Date  . AV FISTULA PLACEMENT Left 05/29/2014   Procedure: INSERTION OF ARTERIOVENOUS (AV) GORE-TEX GRAFT ARM-LEFT;  Surgeon: Angelia Mould, MD;  Location: [place];  Service: Vascular;  Laterality: Left;  . CHOLECYSTECTOMY  02/12/07  . INSERTION OF DIALYSIS CATHETER Right 05/27/2014   Procedure: INSERTION OF DIALYSIS CATHETER;  Surgeon: Angelia Mould, MD;  Location: [facility];  Service: Vascular;  Laterality: Right;  . REFRACTIVE SURGERY  07/29/09   Dr. Bing Plume  . TONSILLECTOMY     Family History  Family History  Problem Relation Age of Onset  . Coronary artery disease Other   . Diabetes Other   .  Hypertension Other   . Arthritis Other    Social History  reports that she has never smoked. She has never used smokeless tobacco. She reports that she does not drink alcohol or use drugs. Allergies  Allergies  Allergen Reactions  . Hydralazine Itching  . Robaxin [Methocarbamol]     Dizziness    Home medications Prior to Admission medications   Medication Sig Start Date End Date Taking? Authorizing Provider  acetaminophen (TYLENOL) 325 MG tablet Take 650 mg by mouth every 6 (six) hours as needed for moderate pain.    Yes [provider]  amLODipine (NORVASC) 10 MG tablet TAKE 1 TABLET BY MOUTH EVERY DAY 12/30/15  Yes Carollee Herter, Yvonne R, DO  carvedilol (COREG) 25 MG tablet TAKE 1 TABLET (25 MG TOTAL) BY MOUTH 2 (TWO) TIMES DAILY WITH A MEAL. 05/11/17  Yes Carollee Herter, Yvonne R, DO  furosemide (LASIX) 40 MG tablet TAKE 1 TABLET BY MOUTH EVERY DAY Patient taking differently: TAKE 1 TABLET BY MOUTH EVERY DAY at bedtime 05/11/17  Yes Roma Schanz R, DO  isosorbide mononitrate (IMDUR) 30 MG 24 hr tablet Take 1 tablet (30 mg total) by mouth 2 (two) times daily. 07/07/17  Yes Roma Schanz R, DO  lisinopril (PRINIVIL,ZESTRIL) 40  MG tablet Take 40 mg by mouth at bedtime. 09/20/16  Yes [provider]  Multiple Vitamin (MULTIVITAMIN WITH MINERALS) TABS tablet Take 2 tablets by mouth daily.   Yes [provider]   Liver Function Tests  Recent Labs Lab 08/15/17 1251  AST 40  ALT 24  ALKPHOS 102  BILITOT 0.9  PROT 7.4  ALBUMIN 3.5    Recent Labs Lab 08/15/17 1251  LIPASE 28   CBC  Recent Labs Lab 08/15/17 1251 08/17/17 0623  WBC 4.5 4.9  NEUTROABS  --  4.0  HGB 8.4* 8.6*  HCT 26.3* 27.0*  MCV 98.1 96.4  PLT 96* 824*   Basic Metabolic Panel  Recent Labs Lab 08/15/17 1251 08/17/17 0623  NA 140 139  K 4.2 4.8  CL 99* 100*  CO2 28 24  GLUCOSE 103* 106*  BUN 36* 57*  CREATININE 5.59* 7.53*  CALCIUM 8.5* 8.4*   Iron/TIBC/Ferritin/  %Sat    Component Value Date/Time   IRON 84 05/27/2014 1500   TIBC 202 (L) 05/27/2014 1500   IRONPCTSAT 42 05/27/2014 1500    Vitals:   08/17/17 1415 08/17/17 1430 08/17/17 1445 08/17/17 1500  BP:  (!) 180/61    Pulse: 80 80 84 93  Resp: 17 18 (!) 21   Temp:      TempSrc:      SpO2: 100% 99% 100% 97%   Exam Gen alert, no distress, elderly AAF No rash, cyanosis or gangrene Sclera anicteric, throat clear  +JVD to mid neck sitting at 90 deg Chest bilat rales 1/2 on R, 1/3 on L Cor reg w, no MRG Abd soft mildly protuberant, no hsm, nontender GU defer MS no joint effusions or deformity Ext 1+ bilat LE pitting edema / no wounds or ulcers Neuro is alert, Ox 3 , nf   HOme meds:  -norvasc 10/ coreg 25 bid/ lasix 40 qd/ imdur 30/ zestril 40 qd/ MVI  Dialysis: MWF [facility] 3.5h   64kg   2/2   Hep 5000   L arm AVG  EKG > NSR, no ischemic changes,   I reviewed the test results/ images personally.    - Hx hep B + Ab - she has old lab evidence here of natural immunity (Core/ Surf Ab +, IgM Core neg, Ag neg), NOT active infection    Impression: 1. Hypoxemia/ dyspnea - has signs of vol overload with LE edema and bilat rales, CHF by CXR. No fevers or ^WBC.  Suspect lean body wt loss and subsequent vol overload in ESRD pt.  Plan needs transfer to [facility] for dialysis tonight.  Max UF with HD tonight.  Lower dry wt as tolerated and resolve vol excess.  2. ESRD on HD 3. HTN - on 3 bp meds, lasix and nitrates. BP's up, get vol down w HD.  4. Anemia of CKD - Hb 8-9 range    Plan - as above  Kelly Splinter MD [employer] pager [phone]   08/17/2017, 4:53 PM

## 2017-08-17 NOTE — ED Notes (Signed)
ED TO INPATIENT HANDOFF REPORT  Name/Age/Gender [name] [name] 76 y.o. female  Code Status    Code Status Orders        Start     Ordered   08/17/17 1033  Full code  Continuous     08/17/17 1033    Code Status History    Date Active Date Inactive Code Status Order ID Comments User Context   11/25/2016  8:43 PM 11/27/2016  5:08 PM Full Code [id number]  Mariel Aloe, MD Inpatient   05/17/2014  6:14 PM 05/31/2014  6:08 PM Full Code [id number]  Kelvin Cellar, MD Inpatient      Home/SNF/Other Home  Chief Complaint SOB  Level of Care/Admitting Diagnosis ED Disposition    ED Disposition Condition [place]: [place] [100100]  Level of Care: Med-Surg [16]  Diagnosis: Dialysis patient, noncompliant [facility]) [412878]  Admitting Physician: Phillips Grout [[id number]]  Attending Physician: Derrill Kay A [[id number]]  PT Class (Do Not Modify): Observation [104]  PT Acc Code (Do Not Modify): Observation [10022]       Medical History Past Medical History:  Diagnosis Date  . DEPRESSION   . DIABETES MELLITUS, TYPE I   . GERD   . GLAUCOMA   . Headache(784.0)   . Hepatitis B carrier ([place])    05/2009: neg Hep C; Hep B: core pos, Surf neg; fatty liver US 8/10 - 7/13  . HYPERLIPIDEMIA   . HYPERTENSION   . Kidney failure    dialysis 3 times per week  . OSTEOPENIA   . POSTMENOPAUSAL STATUS   . Unspecified vitamin D deficiency     Allergies Allergies  Allergen Reactions  . Hydralazine Itching  . Robaxin [Methocarbamol]     Dizziness     IV Location/Drains/Wounds Patient Lines/Drains/Airways Status   Active Line/Drains/Airways    Name:   Placement date:   Placement time:   Site:   Days:   Peripheral IV 08/17/17 Right Hand  08/17/17    0657    Hand    less than 1   Fistula / Graft Left Upper arm Arteriovenous vein graft  05/29/14    1236    Upper arm    1176   Hemodialysis Catheter Right  05/27/14    1043    Internal jugular    1178   Incision (Closed) 05/27/14 Neck Right  05/27/14    1102      1178   Incision (Closed) 05/27/14 Neck Right  05/27/14    1103      1178   Incision (Closed) 05/29/14 Arm Left  05/29/14    1241      1176          Labs/Imaging Results for orders placed or performed during the hospital encounter of 08/17/17 (from the past 48 hour(s))  Basic metabolic panel     Status: Abnormal   Collection Time: 08/17/17  6:23 AM  Result Value Ref Range   Sodium 139 135 - 145 mmol/L   Potassium 4.8 3.5 - 5.1 mmol/L   Chloride 100 (L) 101 - 111 mmol/L   CO2 24 22 - 32 mmol/L   Glucose, Bld 106 (H) 65 - 99 mg/dL   BUN 57 (H) 6 - 20 mg/dL   Creatinine, Ser 7.53 (H) 0.44 - 1.00 mg/dL   Calcium 8.4 (L) 8.9 - 10.3 mg/dL   GFR calc non Af Amer 5 (L) >60 mL/min   GFR calc Af  Amer 5 (L) >60 mL/min    Comment: (NOTE) The eGFR has been calculated using the CKD EPI equation. This calculation has not been validated in all clinical situations. eGFR's persistently <60 mL/min signify possible Chronic Kidney Disease.    Anion gap 15 5 - 15  CBC with Differential/Platelet     Status: Abnormal   Collection Time: 08/17/17  6:23 AM  Result Value Ref Range   WBC 4.9 4.0 - 10.5 K/uL   RBC 2.80 (L) 3.87 - 5.11 MIL/uL   Hemoglobin 8.6 (L) 12.0 - 15.0 g/dL   HCT 27.0 (L) 36.0 - 46.0 %   MCV 96.4 78.0 - 100.0 fL   MCH 30.7 26.0 - 34.0 pg   MCHC 31.9 30.0 - 36.0 g/dL   RDW 17.8 (H) 11.5 - 15.5 %   Platelets 104 (L) 150 - 400 K/uL    Comment: CONSISTENT WITH PREVIOUS RESULT   Neutrophils Relative % 81 %   Neutro Abs 4.0 1.7 - 7.7 K/uL   Lymphocytes Relative 12 %   Lymphs Abs 0.6 (L) 0.7 - 4.0 K/uL   Monocytes Relative 5 %   Monocytes Absolute 0.2 0.1 - 1.0 K/uL   Eosinophils Relative 2 %   Eosinophils Absolute 0.1 0.0 - 0.7 K/uL   Basophils Relative 1 %   Basophils Absolute 0.0 0.0 - 0.1 K/uL  POC occult blood, ED     Status: Abnormal   Collection Time: 08/17/17  7:14 AM  Result Value Ref Range   Fecal Occult  Bld POSITIVE (A) NEGATIVE  Influenza panel by PCR (type A & B)     Status: None   Collection Time: 08/17/17  8:27 AM  Result Value Ref Range   Influenza A By PCR NEGATIVE NEGATIVE   Influenza B By PCR NEGATIVE NEGATIVE    Comment: (NOTE) The Xpert Xpress Flu assay is intended as an aid in the diagnosis of  influenza and should not be used as a sole basis for treatment.  This  assay is FDA approved for nasopharyngeal swab specimens only. Nasal  washings and aspirates are unacceptable for Xpert Xpress Flu testing.    Dg Chest 2 View  Result Date: 08/17/2017 CLINICAL DATA:  Shortness of breath for 24 hours. EXAM: CHEST  2 VIEW COMPARISON:  11/25/2016 FINDINGS: Borderline heart size and pulmonary vascularity. Bilateral perihilar and basilar infiltrates, most prominent on the right. This may represent edema or bilateral pneumonia. Small bilateral pleural effusions with fluid in the fissures. No pneumothorax. Calcification of the aorta. Surgical clips in the right upper quadrant. Vascular graft in the left axilla. IMPRESSION: Cardiac enlargement with mild pulmonary vascular congestion. Bilateral perihilar and basilar airspace disease, most prominent in the right. Changes may represent edema or bilateral pneumonia. Small bilateral pleural effusions. Electronically Signed   By: Lucienne Capers M.D.   On: 08/17/2017 06:06    Pending Labs Unresulted Labs    Start     Ordered   08/18/17 4462  Basic metabolic panel  Tomorrow morning,   R     08/17/17 1033   08/18/17 0500  CBC  Tomorrow morning,   R     08/17/17 1033      Vitals/Pain Today's Vitals   08/17/17 1415 08/17/17 1430 08/17/17 1445 08/17/17 1500  BP:  (!) 180/61    Pulse: 80 80 84 93  Resp: 17 18 (!) 21   Temp:      TempSrc:      SpO2: 100% 99% 100% 97%  Isolation Precautions Enteric precautions (UV disinfection)  Medications Medications  isosorbide mononitrate (IMDUR) 24 hr tablet 30 mg (not administered)  carvedilol  (COREG) tablet 25 mg (not administered)  furosemide (LASIX) tablet 40 mg (40 mg Oral Given 08/17/17 1058)  lisinopril (PRINIVIL,ZESTRIL) tablet 40 mg (not administered)  amLODipine (NORVASC) tablet 10 mg (10 mg Oral Given 08/17/17 1057)  sodium chloride flush (NS) 0.9 % injection 3 mL (3 mLs Intravenous Given 08/17/17 1057)  sodium chloride flush (NS) 0.9 % injection 3 mL (not administered)  0.9 %  sodium chloride infusion (not administered)  acetaminophen (TYLENOL) tablet 650 mg (not administered)    Or  acetaminophen (TYLENOL) suppository 650 mg (not administered)  ondansetron (ZOFRAN) tablet 4 mg (not administered)    Or  ondansetron (ZOFRAN) injection 4 mg (not administered)    Mobility Ambulatory with assistance

## 2017-08-17 NOTE — ED Notes (Signed)
Bed: RESB Expected date:  Expected time:  Means of arrival:  Comments: EMS chest congestion/O2

## 2017-08-17 NOTE — ED Notes (Signed)
Called [facility] for transport 

## 2017-08-17 NOTE — H&P (Signed)
History and Physical    [name] [name] MEQ:[id number] DOB: [birth year] DOA: 08/17/2017  PCP: Ann Held, DO  Patient coming from:  home  Chief Complaint:   diarrhea  HPI: [name] [name] is a 76 y.o. female with medical history significant of ESRD MWF, DM, HTN comes in for not feeling well and having diarrhea.  Pt reports diarrhea for over 3 days about 3 stools a day, more so today with some associated lower abdominal cramping.  No fevers, no chills.  No n/v.  She reported some blood in stool bright red earlier today.  Per rectal exam by EDP stool was not bloody, normal brown and partially heme positive.  Pt missed her dialysis yesterday because she was not feeling well with loose stools.  Today she is more sob.  No peripheral edema.  Pt referred for admission for the need for dialysis, [facility] already [facility].  Review of Systems: As per HPI otherwise 10 point review of systems negative.   Past Medical History:  Diagnosis Date  . DEPRESSION   . DIABETES MELLITUS, TYPE I   . GERD   . GLAUCOMA   . Headache(784.0)   . Hepatitis B carrier ([place])    05/2009: neg Hep C; Hep B: core pos, Surf neg; fatty liver US 8/10 - 7/13  . HYPERLIPIDEMIA   . HYPERTENSION   . Kidney failure    dialysis 3 times per week  . OSTEOPENIA   . POSTMENOPAUSAL STATUS   . Unspecified vitamin D deficiency     Past Surgical History:  Procedure Laterality Date  . AV FISTULA PLACEMENT Left 05/29/2014   Procedure: INSERTION OF ARTERIOVENOUS (AV) GORE-TEX GRAFT ARM-LEFT;  Surgeon: Angelia Mould, MD;  Location: [facility];  Service: Vascular;  Laterality: Left;  . CHOLECYSTECTOMY  02/12/07  . INSERTION OF DIALYSIS CATHETER Right 05/27/2014   Procedure: INSERTION OF DIALYSIS CATHETER;  Surgeon: Angelia Mould, MD;  Location: [facility];  Service: Vascular;  Laterality: Right;  . REFRACTIVE SURGERY  07/29/09   Dr. Bing Plume  . TONSILLECTOMY       reports that she has never smoked. She has  never used smokeless tobacco. She reports that she does not drink alcohol or use drugs.  Allergies  Allergen Reactions  . Hydralazine Itching  . Robaxin [Methocarbamol]     Dizziness     Family History  Problem Relation Age of Onset  . Coronary artery disease Other   . Diabetes Other   . Hypertension Other   . Arthritis Other     Prior to Admission medications   Medication Sig Start Date End Date Taking? Authorizing Provider  acetaminophen (TYLENOL) 325 MG tablet Take 325 mg by mouth every 6 (six) hours as needed for moderate pain.    [provider]  amLODipine (NORVASC) 10 MG tablet TAKE 1 TABLET BY MOUTH EVERY DAY 12/30/15   Carollee Herter, Yvonne R, DO  carvedilol (COREG) 25 MG tablet TAKE 1 TABLET (25 MG TOTAL) BY MOUTH 2 (TWO) TIMES DAILY WITH A MEAL. 05/11/17   Carollee Herter, Yvonne R, DO  furosemide (LASIX) 40 MG tablet TAKE 1 TABLET BY MOUTH EVERY DAY 05/11/17   Carollee Herter, Alferd Apa, DO  isosorbide mononitrate (IMDUR) 30 MG 24 hr tablet Take 1 tablet (30 mg total) by mouth 2 (two) times daily. 07/07/17   Ann Held, DO  lisinopril (PRINIVIL,ZESTRIL) 40 MG tablet Take 40 mg by mouth at bedtime. 09/20/16   [provider]  Multiple Vitamin (MULTIVITAMIN WITH MINERALS) TABS tablet Take 2 tablets by mouth daily.    [provider]    Physical Exam: Vitals:   08/17/17 0437 08/17/17 0710 08/17/17 0720 08/17/17 0725  BP: (!) 189/64 (!) 190/68    Pulse: 86 88    Resp: 17 (!) 21    Temp: 98.7 F (37.1 C)     TempSrc: Oral     SpO2: 92% 90% 91% 95%    Constitutional: NAD, calm, comfortable Vitals:   08/17/17 0437 08/17/17 0710 08/17/17 0720 08/17/17 0725  BP: (!) 189/64 (!) 190/68    Pulse: 86 88    Resp: 17 (!) 21    Temp: 98.7 F (37.1 C)     TempSrc: Oral     SpO2: 92% 90% 91% 95%   Eyes: PERRL, lids and conjunctivae normal ENMT: Mucous membranes are moist. Posterior pharynx clear of any exudate or lesions.Normal dentition.  Neck:  normal, supple, no masses, no thyromegaly Respiratory: clear to auscultation bilaterally, no wheezing, no crackles. Normal respiratory effort. No accessory muscle use.  Cardiovascular: Regular rate and rhythm, no murmurs / rubs / gallops. No extremity edema. 2+ pedal pulses. No carotid bruits.  Abdomen: no tenderness, no masses palpated. No hepatosplenomegaly. Bowel sounds positive.  Musculoskeletal: no clubbing / cyanosis. No joint deformity upper and lower extremities. Good ROM, no contractures. Normal muscle tone.  Skin: no rashes, lesions, ulcers. No induration Neurologic: CN 2-12 grossly intact. Sensation intact, DTR normal. Strength 5/5 in all 4.  Psychiatric: Normal judgment and insight. Alert and oriented x 3. Normal mood.    Labs on Admission: I have personally reviewed following labs and imaging studies  CBC:  Recent Labs Lab 08/15/17 1251 08/17/17 0623  WBC 4.5 4.9  NEUTROABS  --  4.0  HGB 8.4* 8.6*  HCT 26.3* 27.0*  MCV 98.1 96.4  PLT 96* 269*   Basic Metabolic Panel:  Recent Labs Lab 08/15/17 1251 08/17/17 0623  NA 140 139  K 4.2 4.8  CL 99* 100*  CO2 28 24  GLUCOSE 103* 106*  BUN 36* 57*  CREATININE 5.59* 7.53*  CALCIUM 8.5* 8.4*   GFR: CrCl cannot be calculated (Unknown ideal weight.). Liver Function Tests:  Recent Labs Lab 08/15/17 1251  AST 40  ALT 24  ALKPHOS 102  BILITOT 0.9  PROT 7.4  ALBUMIN 3.5    Recent Labs Lab 08/15/17 1251  LIPASE 28   Radiological Exams on Admission: Dg Chest 2 View  Result Date: 08/17/2017 CLINICAL DATA:  Shortness of breath for 24 hours. EXAM: CHEST  2 VIEW COMPARISON:  11/25/2016 FINDINGS: Borderline heart size and pulmonary vascularity. Bilateral perihilar and basilar infiltrates, most prominent on the right. This may represent edema or bilateral pneumonia. Small bilateral pleural effusions with fluid in the fissures. No pneumothorax. Calcification of the aorta. Surgical clips in the right upper quadrant.  Vascular graft in the left axilla. IMPRESSION: Cardiac enlargement with mild pulmonary vascular congestion. Bilateral perihilar and basilar airspace disease, most prominent in the right. Changes may represent edema or bilateral pneumonia. Small bilateral pleural effusions. Electronically Signed   By: Lucienne Capers M.D.   On: 08/17/2017 06:06   Old chart reviewed Case discussed with edp  Assessment/Plan 76 yo female missed dialysis yesterday comes in with diarrhea  Principal Problem:   Acute respiratory failure with hypoxia ([place])- has edema on cxr from missing dialysis yesterday.  Normalized with  supplementation.  No respiratory distress.  Transfer [facility] for  [facility].  Active Problems:   Dialysis patient, noncompliant ([place])- missed due to diarrhea, w/u diarrhea   Essential hypertension- stable, cont home meds.   Chronic kidney disease, stage IV (severe) ([facility])- noted   Pulmonary edema- as above, needs dialysis today   Chronic diastolic heart failure ([place])- stable    Diarrhea - high risk for cdiff.  Ck cdiff.  Pt instructed if she has any more blood in stool to let us know, none on ED exam.  If none can w/u as outpatient, if has any further bleeding monitor.  hgb stable b/w 8-9.   DVT prophylaxis:  scds Code Status:  full Family Communication:  husband Disposition Plan:  Per day team Consults called:  nephrology Admission status:  observation   Anelia Carriveau A MD [facility]  If 7PM-7AM, please contact night-coverage [email or web address] Password [email or web address]  08/17/2017, 8:29 AM

## 2017-08-17 NOTE — Progress Notes (Signed)
New Admission Note:   Arrival Method: Pt arrived from [facility] ED via [facility] transport Mental Orientation: Alert and oriented x4 Telemetry: N/A Assessment: Completed Skin: See doc flowsheet IV: NSL-Rt H Pain: Denies Tubes: N/A Safety Measures: Safety Fall Prevention Plan has been given, discussed. Admission: Completed [facility] Orientation: Patient has been orientated to the room, unit and staff.  Family: Daughter at bedside  Orders have been reviewed and implemented. Will continue to monitor the patient. Call light has been placed within reach and bed alarm has been activated.   [employer], RN-BC Phone number: [phone]

## 2017-08-17 NOTE — Progress Notes (Signed)
Called HD RN to ask if patient is on the list for HD tonight ,per Tori RN,patient is not on the schedule.Per Dr. Jonnie Finner note,pt to be dialyzed tonight. Paged Dr. Clemencia Course call back. 22:15 Second call sent to Dr. Clemencia Course call back. 23:04 Another page sent to Dr. Mariana Kaufman MD.pt will be dialyzed in the am.Patient and daughter made aware. [name] [name], Wonda Cheng, Therapist, sports

## 2017-08-17 NOTE — ED Provider Notes (Addendum)
[facility] DEPT Provider Note   CSN: [id number] Arrival date & time: 08/17/17  0153     History   Chief Complaint Chief Complaint  Patient presents with  . Shortness of Breath    HPI [name] [name] is a 76 y.o. female.  HPI Complains of nonproductive cough and shortness of breath for 4 days. Also presents with diarrhea for the past one or 2 weeks.. Also complains of intermittent lower abdominal pain for the past few days. No fever. Pain is crampy waxes and wanes is worse when she has a bowel movement.mproved spontaneously She had a bloody stool this morning. Last hemodialysis was 3 days ago. She was scheduled for dialysisyesterday but did not go, because she was feeling too ill. No treatment prior to coming . No other associated symptoms. Past Medical History:  Diagnosis Date  . DEPRESSION   . DIABETES MELLITUS, TYPE I   . GERD   . GLAUCOMA   . Headache(784.0)   . Hepatitis B carrier ([place])    05/2009: neg Hep C; Hep B: core pos, Surf neg; fatty liver US 8/10 - 7/13  . HYPERLIPIDEMIA   . HYPERTENSION   . Kidney failure    dialysis 3 times per week  . OSTEOPENIA   . POSTMENOPAUSAL STATUS   . Unspecified vitamin D deficiency     Patient Active Problem List   Diagnosis Date Noted  . Thumb pain, left 12/22/2016  . Pulmonary edema 11/25/2016  . Acute respiratory failure with hypoxia ([place]) 11/25/2016  . Chronic diastolic heart failure ([place]) 11/25/2016  . End stage renal disease ([place]) 06/25/2014  . CAP (community acquired pneumonia) 05/17/2014  . Malignant hypertension 05/17/2014  . Chest pain 05/17/2014  . Elevated troponin 05/17/2014  . Hypertensive emergency 05/17/2014  . NSTEMI (non-ST elevated myocardial infarction) ([place]) 05/17/2014  . Chronic kidney disease, stage IV (severe) ([place]) 12/05/2013  . GERD 06/17/2009  . Hepatitis B carrier ([place]) 06/17/2009  . UNSPECIFIED VITAMIN D DEFICIENCY 05/21/2009  . DEPRESSION 05/20/2009  .  GLAUCOMA 05/20/2009  . HEADACHE 05/20/2009  . CARDIAC MURMUR 05/20/2009  . OSTEOPENIA 02/15/2008  . POSTMENOPAUSAL STATUS 12/21/2007  . Hyperlipidemia LDL goal <70 09/21/2007  . Essential hypertension 09/13/2007  . Type II diabetes mellitus with nephropathy [facility])     Past Surgical History:  Procedure Laterality Date  . AV FISTULA PLACEMENT Left 05/29/2014   Procedure: INSERTION OF ARTERIOVENOUS (AV) GORE-TEX GRAFT ARM-LEFT;  Surgeon: Angelia Mould, MD;  Location: [facility];  Service: Vascular;  Laterality: Left;  . CHOLECYSTECTOMY  02/12/07  . INSERTION OF DIALYSIS CATHETER Right 05/27/2014   Procedure: INSERTION OF DIALYSIS CATHETER;  Surgeon: Angelia Mould, MD;  Location: [facility];  Service: Vascular;  Laterality: Right;  . REFRACTIVE SURGERY  07/29/09   Dr. Bing Plume  . TONSILLECTOMY      OB History    No data available       Home Medications    Prior to Admission medications   Medication Sig Start Date End Date Taking? Authorizing Provider  acetaminophen (TYLENOL) 325 MG tablet Take 325 mg by mouth every 6 (six) hours as needed for moderate pain.    [provider]  amLODipine (NORVASC) 10 MG tablet TAKE 1 TABLET BY MOUTH EVERY DAY 12/30/15   Carollee Herter, Yvonne R, DO  carvedilol (COREG) 25 MG tablet TAKE 1 TABLET (25 MG TOTAL) BY MOUTH 2 (TWO) TIMES DAILY WITH A MEAL. 05/11/17   Ann Held, DO  furosemide (LASIX) 40 MG tablet TAKE 1 TABLET BY MOUTH EVERY DAY 05/11/17   Carollee Herter, Alferd Apa, DO  isosorbide mononitrate (IMDUR) 30 MG 24 hr tablet Take 1 tablet (30 mg total) by mouth 2 (two) times daily. 07/07/17   Ann Held, DO  lisinopril (PRINIVIL,ZESTRIL) 40 MG tablet Take 40 mg by mouth at bedtime. 09/20/16   [provider]  Multiple Vitamin (MULTIVITAMIN WITH MINERALS) TABS tablet Take 2 tablets by mouth daily.    [provider]    Family History Family History  Problem Relation Age of Onset  . Coronary artery disease  Other   . Diabetes Other   . Hypertension Other   . Arthritis Other     Social History Social History  Substance Use Topics  . Smoking status: Never Smoker  . Smokeless tobacco: Never Used     Comment: Married x's 25yrs, 6 kids-scattered [employer]. Retired Consulting civil engineer  . Alcohol use No     Allergies   Hydralazine and Robaxin [methocarbamol]   Review of Systems Review of Systems  Constitutional: Negative.   HENT: Negative.   Respiratory: Positive for cough and shortness of breath.   Cardiovascular: Negative.   Gastrointestinal: Positive for abdominal pain, blood in stool and diarrhea.  Musculoskeletal: Negative.   Skin: Negative.   Allergic/Immunologic: Positive for immunocompromised state.  Neurological: Negative.   Psychiatric/Behavioral: Negative.   All other systems reviewed and are negative.    Physical Exam Updated Vital Signs BP (!) 190/68 (BP Location: Right Arm)   Pulse 88   Temp 98.7 F (37.1 C) (Oral)   Resp (!) 21   SpO2 90%   Physical Exam  Constitutional: She appears well-developed and well-nourished. No distress.  HENT:  Head: Normocephalic and atraumatic.  Eyes: Pupils are equal, round, and reactive to light. Conjunctivae are normal.  Neck: Neck supple. No tracheal deviation present. No thyromegaly present.  Cardiovascular: Normal rate and regular rhythm.   No murmur heard. Pulmonary/Chest: Effort normal.  Diffuse rhonchi  Abdominal: Soft. Bowel sounds are normal. She exhibits no distension. There is no tenderness.  Genitourinary: Rectal exam shows guaiac positive stool.  Genitourinary Comments: Rectal normal tone brown stool no gross blood Hemoccult positive  Musculoskeletal: Normal range of motion. She exhibits no edema or tenderness.  Left upper extremity with dialysis fistula with good thrill. All other extremities withoutswelling or tenderness, neurovascularly intact  Neurological: She is alert. Coordination normal.  Skin: Skin is warm  and dry. No rash noted.  Psychiatric: She has a normal mood and affect.  Nursing note and vitals reviewed.    ED Treatments / Results  Labs (all labs ordered are listed, but only abnormal results are displayed) Labs Reviewed  CBC WITH DIFFERENTIAL/PLATELET - Abnormal; Notable for the following:       Result Value   RBC 2.80 (*)    Hemoglobin 8.6 (*)    HCT 27.0 (*)    RDW 17.8 (*)    Platelets 104 (*)    Lymphs Abs 0.6 (*)    All other components within normal limits  BASIC METABOLIC PANEL  POC OCCULT BLOOD, ED    EKG  EKG Interpretation None       Date: 08/17/2017  Rate: 85  Rhythm: normal sinus rhythm  QRS Axis: normal  Intervals: normal  ST/T Wave abnormalities: normal  Conduction Disutrbances: none  Narrative Interpretation: unremarkable    Radiology Dg Chest 2 View  Result Date: 08/17/2017 CLINICAL DATA:  Shortness of  breath for 24 hours. EXAM: CHEST  2 VIEW COMPARISON:  11/25/2016 FINDINGS: Borderline heart size and pulmonary vascularity. Bilateral perihilar and basilar infiltrates, most prominent on the right. This may represent edema or bilateral pneumonia. Small bilateral pleural effusions with fluid in the fissures. No pneumothorax. Calcification of the aorta. Surgical clips in the right upper quadrant. Vascular graft in the left axilla. IMPRESSION: Cardiac enlargement with mild pulmonary vascular congestion. Bilateral perihilar and basilar airspace disease, most prominent in the right. Changes may represent edema or bilateral pneumonia. Small bilateral pleural effusions. Electronically Signed   By: Lucienne Capers M.D.   On: 08/17/2017 06:06    Procedures Procedures (including critical care time)  Medications Ordered in ED Medications - No data to display  Chest x-ray viewed by me Results for orders placed or performed during the hospital encounter of 69/67/89  Basic metabolic panel  Result Value Ref Range   Sodium 139 135 - 145 mmol/L   Potassium  4.8 3.5 - 5.1 mmol/L   Chloride 100 (L) 101 - 111 mmol/L   CO2 24 22 - 32 mmol/L   Glucose, Bld 106 (H) 65 - 99 mg/dL   BUN 57 (H) 6 - 20 mg/dL   Creatinine, Ser 7.53 (H) 0.44 - 1.00 mg/dL   Calcium 8.4 (L) 8.9 - 10.3 mg/dL   GFR calc non Af Amer 5 (L) >60 mL/min   GFR calc Af Amer 5 (L) >60 mL/min   Anion gap 15 5 - 15  CBC with Differential/Platelet  Result Value Ref Range   WBC 4.9 4.0 - 10.5 K/uL   RBC 2.80 (L) 3.87 - 5.11 MIL/uL   Hemoglobin 8.6 (L) 12.0 - 15.0 g/dL   HCT 27.0 (L) 36.0 - 46.0 %   MCV 96.4 78.0 - 100.0 fL   MCH 30.7 26.0 - 34.0 pg   MCHC 31.9 30.0 - 36.0 g/dL   RDW 17.8 (H) 11.5 - 15.5 %   Platelets 104 (L) 150 - 400 K/uL   Neutrophils Relative % 81 %   Neutro Abs 4.0 1.7 - 7.7 K/uL   Lymphocytes Relative 12 %   Lymphs Abs 0.6 (L) 0.7 - 4.0 K/uL   Monocytes Relative 5 %   Monocytes Absolute 0.2 0.1 - 1.0 K/uL   Eosinophils Relative 2 %   Eosinophils Absolute 0.1 0.0 - 0.7 K/uL   Basophils Relative 1 %   Basophils Absolute 0.0 0.0 - 0.1 K/uL   Dg Chest 2 View  Result Date: 08/17/2017 CLINICAL DATA:  Shortness of breath for 24 hours. EXAM: CHEST  2 VIEW COMPARISON:  11/25/2016 FINDINGS: Borderline heart size and pulmonary vascularity. Bilateral perihilar and basilar infiltrates, most prominent on the right. This may represent edema or bilateral pneumonia. Small bilateral pleural effusions with fluid in the fissures. No pneumothorax. Calcification of the aorta. Surgical clips in the right upper quadrant. Vascular graft in the left axilla. IMPRESSION: Cardiac enlargement with mild pulmonary vascular congestion. Bilateral perihilar and basilar airspace disease, most prominent in the right. Changes may represent edema or bilateral pneumonia. Small bilateral pleural effusions. Electronically Signed   By: Lucienne Capers M.D.   On: 08/17/2017 06:06   Initial Impression / Assessment and Plan / ED Course  I have reviewed the triage vital signs and the nursing  notes.  Pertinent labs & imaging results that were available during my care of the patient were reviewed by me and considered in my medical decision making (see chart for details).  Pulse oximetry on room air in my presence 88% consistent with mild hypoxemia. Patient placed on supplement oxygen I consulted Dr. Meredeth Ide from nephrology service who will arrange for patient to get hemodialysis today. I've also consulted Dr. Shanon Brow from hospital service who will arrange for transfer to [facility]. Patient with likely viral illness with cough and diarrhea.  Final Clinical Impressions(s) / ED Diagnoses  Diagnosis #1 cough #2 dyspnea #3 diarrhea #4 hypoxemia #5Hemoccult positive stools Final diagnoses:  None    New Prescriptions New Prescriptions   No medications on file     Orlie Dakin, MD 08/17/17 0830    Orlie Dakin, MD 08/17/17 0830

## 2017-08-17 NOTE — ED Triage Notes (Signed)
Pt having cold like symptoms past 3 days no use of OTC  Pt c/o SOB

## 2017-08-18 DIAGNOSIS — K219 Gastro-esophageal reflux disease without esophagitis: Secondary | ICD-10-CM | POA: Diagnosis present

## 2017-08-18 DIAGNOSIS — M8580 Other specified disorders of bone density and structure, unspecified site: Secondary | ICD-10-CM | POA: Diagnosis present

## 2017-08-18 DIAGNOSIS — Z794 Long term (current) use of insulin: Secondary | ICD-10-CM | POA: Diagnosis not present

## 2017-08-18 DIAGNOSIS — J9691 Respiratory failure, unspecified with hypoxia: Secondary | ICD-10-CM | POA: Diagnosis present

## 2017-08-18 DIAGNOSIS — N186 End stage renal disease: Secondary | ICD-10-CM | POA: Diagnosis present

## 2017-08-18 DIAGNOSIS — E8779 Other fluid overload: Secondary | ICD-10-CM | POA: Diagnosis present

## 2017-08-18 DIAGNOSIS — E8770 Fluid overload, unspecified: Secondary | ICD-10-CM | POA: Diagnosis present

## 2017-08-18 DIAGNOSIS — Z992 Dependence on renal dialysis: Secondary | ICD-10-CM | POA: Diagnosis not present

## 2017-08-18 DIAGNOSIS — J9601 Acute respiratory failure with hypoxia: Secondary | ICD-10-CM | POA: Diagnosis present

## 2017-08-18 DIAGNOSIS — H409 Unspecified glaucoma: Secondary | ICD-10-CM | POA: Diagnosis present

## 2017-08-18 DIAGNOSIS — E785 Hyperlipidemia, unspecified: Secondary | ICD-10-CM | POA: Diagnosis present

## 2017-08-18 DIAGNOSIS — J810 Acute pulmonary edema: Secondary | ICD-10-CM | POA: Diagnosis not present

## 2017-08-18 DIAGNOSIS — Z9115 Patient's noncompliance with renal dialysis: Secondary | ICD-10-CM | POA: Diagnosis not present

## 2017-08-18 DIAGNOSIS — I10 Essential (primary) hypertension: Secondary | ICD-10-CM | POA: Diagnosis not present

## 2017-08-18 DIAGNOSIS — I5032 Chronic diastolic (congestive) heart failure: Secondary | ICD-10-CM | POA: Diagnosis present

## 2017-08-18 DIAGNOSIS — R0902 Hypoxemia: Secondary | ICD-10-CM | POA: Diagnosis present

## 2017-08-18 DIAGNOSIS — A084 Viral intestinal infection, unspecified: Secondary | ICD-10-CM | POA: Diagnosis present

## 2017-08-18 DIAGNOSIS — E1022 Type 1 diabetes mellitus with diabetic chronic kidney disease: Secondary | ICD-10-CM | POA: Diagnosis present

## 2017-08-18 DIAGNOSIS — D631 Anemia in chronic kidney disease: Secondary | ICD-10-CM | POA: Diagnosis present

## 2017-08-18 DIAGNOSIS — I132 Hypertensive heart and chronic kidney disease with heart failure and with stage 5 chronic kidney disease, or end stage renal disease: Secondary | ICD-10-CM | POA: Diagnosis present

## 2017-08-18 LAB — CBC
HCT: 22.7 % — ABNORMAL LOW (ref 36.0–46.0)
Hemoglobin: 7.1 g/dL — ABNORMAL LOW (ref 12.0–15.0)
MCH: 29.8 pg (ref 26.0–34.0)
MCHC: 31.3 g/dL (ref 30.0–36.0)
MCV: 95.4 fL (ref 78.0–100.0)
PLATELETS: 83 10*3/uL — AB (ref 150–400)
RBC: 2.38 MIL/uL — AB (ref 3.87–5.11)
RDW: 17.6 % — AB (ref 11.5–15.5)
WBC: 2.9 10*3/uL — AB (ref 4.0–10.5)

## 2017-08-18 LAB — MRSA PCR SCREENING: MRSA BY PCR: NEGATIVE

## 2017-08-18 LAB — RENAL FUNCTION PANEL
Albumin: 2.9 g/dL — ABNORMAL LOW (ref 3.5–5.0)
Anion gap: 15 (ref 5–15)
BUN: 68 mg/dL — AB (ref 6–20)
CALCIUM: 8.1 mg/dL — AB (ref 8.9–10.3)
CO2: 22 mmol/L (ref 22–32)
CREATININE: 8.78 mg/dL — AB (ref 0.44–1.00)
Chloride: 99 mmol/L — ABNORMAL LOW (ref 101–111)
GFR, EST AFRICAN AMERICAN: 5 mL/min — AB (ref >60)
GFR, EST NON AFRICAN AMERICAN: 4 mL/min — AB (ref >60)
Glucose, Bld: 111 mg/dL — ABNORMAL HIGH (ref 65–99)
Phosphorus: 6.2 mg/dL — ABNORMAL HIGH (ref 2.5–4.6)
Potassium: 4.7 mmol/L (ref 3.5–5.1)
SODIUM: 136 mmol/L (ref 135–145)

## 2017-08-18 LAB — HEPATITIS B SURFACE ANTIGEN: Hepatitis B Surface Ag: NEGATIVE

## 2017-08-18 LAB — C DIFFICILE QUICK SCREEN W PCR REFLEX
C DIFFICILE (CDIFF) TOXIN: NEGATIVE
C DIFFICLE (CDIFF) ANTIGEN: NEGATIVE
C Diff interpretation: NOT DETECTED

## 2017-08-18 MED ORDER — LIDOCAINE-PRILOCAINE 2.5-2.5 % EX CREA: 1.0000 "application " | TOPICAL_CREAM | CUTANEOUS | Status: DC | PRN

## 2017-08-18 MED ORDER — SODIUM CHLORIDE 0.9 % IV SOLN: 100.0000 mL | INTRAVENOUS | Status: DC | PRN

## 2017-08-18 MED ORDER — PENTAFLUOROPROP-TETRAFLUOROETH EX AERO: 1.0000 "application " | INHALATION_SPRAY | CUTANEOUS | Status: DC | PRN

## 2017-08-18 MED ORDER — ACETAMINOPHEN 325 MG PO TABS
ORAL_TABLET | ORAL | Status: AC
  Filled 2017-08-18: qty 2

## 2017-08-18 MED ORDER — HEPARIN SODIUM (PORCINE) 1000 UNIT/ML DIALYSIS
5000.0000 [IU] | INTRAMUSCULAR | Status: DC | PRN
  Filled 2017-08-18: qty 5

## 2017-08-18 MED ORDER — LIDOCAINE HCL (PF) 1 % IJ SOLN: 5.0000 mL | INTRAMUSCULAR | Status: DC | PRN

## 2017-08-18 MED ORDER — HEPARIN SODIUM (PORCINE) 1000 UNIT/ML DIALYSIS: 1000.0000 [IU] | INTRAMUSCULAR | Status: DC | PRN

## 2017-08-18 MED ORDER — ALTEPLASE 2 MG IJ SOLR: 2.0000 mg | Freq: Once | INTRAMUSCULAR | Status: DC | PRN

## 2017-08-18 NOTE — Progress Notes (Signed)
[facility] Progress Note  Subjective: no new c/o, getting started on HD now  Vitals:   08/17/17 1827 08/17/17 1830 08/17/17 2055 08/18/17 0500  BP:  (!) 164/47 (!) 179/56 (!) 159/45  Pulse:  85 73 77  Resp:  (!) 22 18 20   Temp:   98.6 F (37 C) 97.8 F (36.6 C)  TempSrc:      SpO2:  100% 97% 100%  Weight: 63.5 kg (140 lb)     Height:   5' 5.5" (1.664 m)     Inpatient medications: . amLODipine  10 mg Oral Daily  . carvedilol  25 mg Oral BID WC  . furosemide  40 mg Oral Daily  . isosorbide mononitrate  30 mg Oral BID  . lisinopril  40 mg Oral QHS  . sodium chloride flush  3 mL Intravenous Q12H   . sodium chloride    . sodium chloride    . sodium chloride     sodium chloride, sodium chloride, sodium chloride, acetaminophen **OR** acetaminophen, alteplase, heparin, heparin, lidocaine (PF), lidocaine-prilocaine, ondansetron **OR** ondansetron (ZOFRAN) IV, pentafluoroprop-tetrafluoroeth, sodium chloride flush  Exam: Gen alert, no distress, elderly AAF No rash, cyanosis or gangrene Sclera anicteric, throat clear  +JVD to mid neck sitting at 90 deg Chest bilat rales 1/2 on R, 1/3 on L Cor reg w, no MRG Abd soft mildly protuberant, no hsm, nontender GU defer MS no joint effusions or deformity Ext 1+ bilat LE pitting edema / no wounds or ulcers Neuro is alert, Ox 3 , nf   HOme meds:  -norvasc 10/ coreg 25 bid/ lasix 40 qd/ imdur 30/ zestril 40 qd/ MVI  Dialysis: [facility] 3.5h   64kg   2/2   Hep 5000   L arm AVG  - Hx hep B + Ab - she has old lab evidence here of natural immunity (Core/ Surf Ab +, IgM Core neg, Ag neg), NOT active infection    Impression: 1. Hypoxemia/ dyspnea - has multiple signs of vol overload on exam and CXR. No fevers or ^WBC.  Can prob dc any abx.  Couldn't get HD last night due to schedule. Starting on HD now, has lost sig body wt and dry wt will need to be lowered.  2. ESRD on HD MWF 3. HTN - on 3 bp meds, lasix and nitrates.  BP's up, get vol down w HD.  4. Anemia of CKD - Hb 8-9 range    Plan - HD today and again tomorrow for volume reduction   Kelly Splinter MD [place] pager [phone]   08/18/2017, 7:46 AM    Recent Labs Lab 08/15/17 1251 08/17/17 0623  NA 140 139  K 4.2 4.8  CL 99* 100*  CO2 28 24  GLUCOSE 103* 106*  BUN 36* 57*  CREATININE 5.59* 7.53*  CALCIUM 8.5* 8.4*    Recent Labs Lab 08/15/17 1251  AST 40  ALT 24  ALKPHOS 102  BILITOT 0.9  PROT 7.4  ALBUMIN 3.5    Recent Labs Lab 08/15/17 1251 08/17/17 0623  WBC 4.5 4.9  NEUTROABS  --  4.0  HGB 8.4* 8.6*  HCT 26.3* 27.0*  MCV 98.1 96.4  PLT 96* 104*   Iron/TIBC/Ferritin/ %Sat    Component Value Date/Time   IRON 84 05/27/2014 1500   TIBC 202 (L) 05/27/2014 1500   IRONPCTSAT 42 05/27/2014 1500

## 2017-08-18 NOTE — Progress Notes (Signed)
PROGRESS NOTE  SHAVETTE SHOAFF ZOX:[id number] DOB: 01-07-1941 DOA: 08/17/2017 PCP: Ann Held, DO  HPI/Recap of past 24 hours: Ms. [name] [name] is a 76 year old female with medical history significant for ESRD on HD MWF, type 2 diabetes, hypertension who presented to the hospital with cough, diarrhea found to have pulmonary edema in the setting of Ms. dialysis.  Patient states she missed her most recent session of HD due to significant diarrhea.  Denies any recent sick contacts.  Denies any fevers or chills.  Patient received HD this morning was only able to tolerate half of the for scheduled hours due to some abdominal pain.  Denied any chest pain fevers chills, or shortness of breath.  Patient did report slightly productive cough.   Assessment/Plan: Principal Problem:   Acute respiratory failure with hypoxia (HCC) Active Problems:   Essential hypertension   Chronic kidney disease, stage IV (severe) (HCC)   Pulmonary edema   Chronic diastolic heart failure ([place])   Dialysis patient, noncompliant (HCC)   Respiratory failure with hypoxia (HCC)   Volume overload    Acute hypoxic respiratory failure, secondary to missed HD Chest x-ray on admission consistent with pulmonary vascular congestion in the setting of missed HD.  Additionally no infectious symptoms, no leukocytosis, and patient remains afebrile.  Nephrology was consulted on admission to start HD -Nephrology recommends 2 consecutive HD sessions   Diarrhea, suspect viral etiology Abdominal pain Given normal lipase, normal LFTs and improvement with supportive care most likely etiology is viral.  Patient denies any recent antibiotic use to suspect Clostridium difficile. -Supportive care, IV antiemetics, judicious pain control  ESRD on HD Patient missed most recent HD session. -HD today, per nephrology plan HD for tomorrow as well -Continue Lasix 40 mg daily  Anemia of chronic disease Baseline hemoglobin 7-8 -  Continue to monitor, ensure consent for transfusion on chart  Hypertension, currently at goal -Carvedilol 25 mg twice daily, amlodipine 10 mg daily, indoor 30 mg twice daily  Code Status: Full code   Family Communication: No family at bedside   Disposition Plan: Removing further volume with second HD session*    Consultants:  Nephrology  Procedures:  None  Antimicrobials:  None  DVT prophylaxis: SCDs   Objective: Vitals:   08/18/17 1030 08/18/17 1100 08/18/17 1115 08/18/17 1333  BP: (!) 149/56 139/61 (!) 161/61 (!) 151/43  Pulse: 68 75 83 78  Resp:   20 18  Temp:   97.7 F (36.5 C) 98.6 F (37 C)  TempSrc:   Oral Oral  SpO2:   100% 97%  Weight:   62 kg (136 lb 11 oz)   Height:        Intake/Output Summary (Last 24 hours) at 08/18/17 1602 Last data filed at 08/18/17 1400  Gross per 24 hour  Intake              240 ml  Output             2196 ml  Net            -1956 ml   Filed Weights   08/17/17 1827 08/18/17 0741 08/18/17 1115  Weight: 63.5 kg (140 lb) 64.5 kg (142 lb 3.2 oz) 62 kg (136 lb 11 oz)    Exam:   General: Lying in bed, tolerating HD session, pleasant in conversation  Cardiovascular: Regular rate and rhythm, no murmurs rubs or gallops  Respiratory: Lungs clear to auscultation on anterior chest feel, difficult to appreciate lung  sounds posteriorly  Abdomen: Soft, nondistended, nontender, normoactive bowel sounds  Musculoskeletal: Normal range of motion  Skin: Dry and intact  Psychiatry: Normal affect and mood   Data Reviewed: CBC:  Recent Labs Lab 08/15/17 1251 08/17/17 0623 08/18/17 0623  WBC 4.5 4.9 2.9*  NEUTROABS  --  4.0  --   HGB 8.4* 8.6* 7.1*  HCT 26.3* 27.0* 22.7*  MCV 98.1 96.4 95.4  PLT 96* 104* 83*   Basic Metabolic Panel:  Recent Labs Lab 08/15/17 1251 08/17/17 0623 08/18/17 0623  NA 140 139 136  K 4.2 4.8 4.7  CL 99* 100* 99*  CO2 28 24 22   GLUCOSE 103* 106* 111*  BUN 36* 57* 68*  CREATININE  5.59* 7.53* 8.78*  CALCIUM 8.5* 8.4* 8.1*  PHOS  --   --  6.2*   GFR: Estimated Creatinine Clearance: 5.1 mL/min (A) (by C-G formula based on SCr of 8.78 mg/dL (H)). Liver Function Tests:  Recent Labs Lab 08/15/17 1251 08/18/17 0623  AST 40  --   ALT 24  --   ALKPHOS 102  --   BILITOT 0.9  --   PROT 7.4  --   ALBUMIN 3.5 2.9*    Recent Labs Lab 08/15/17 1251  LIPASE 28   No results for input(s): AMMONIA in the last 168 hours. Coagulation Profile: No results for input(s): INR, PROTIME in the last 168 hours. Cardiac Enzymes: No results for input(s): CKTOTAL, CKMB, CKMBINDEX, TROPONINI in the last 168 hours. BNP (last 3 results) No results for input(s): PROBNP in the last 8760 hours. HbA1C: No results for input(s): HGBA1C in the last 72 hours. CBG: No results for input(s): GLUCAP in the last 168 hours. Lipid Profile: No results for input(s): CHOL, HDL, LDLCALC, TRIG, CHOLHDL, LDLDIRECT in the last 72 hours. Thyroid Function Tests: No results for input(s): TSH, T4TOTAL, FREET4, T3FREE, THYROIDAB in the last 72 hours. Anemia Panel: No results for input(s): VITAMINB12, FOLATE, FERRITIN, TIBC, IRON, RETICCTPCT in the last 72 hours. Urine analysis:    Component Value Date/Time   COLORURINE YELLOW 11/25/2016 1630   APPEARANCEUR CLEAR 11/25/2016 1630   LABSPEC 1.010 11/25/2016 1630   PHURINE 8.0 11/25/2016 1630   GLUCOSEU NEGATIVE 11/25/2016 1630   HGBUR NEGATIVE 11/25/2016 1630   BILIRUBINUR NEGATIVE 11/25/2016 1630   KETONESUR NEGATIVE 11/25/2016 1630   PROTEINUR >=300 (A) 11/25/2016 1630   UROBILINOGEN 2.0 (H) 04/01/2008 1751   NITRITE NEGATIVE 11/25/2016 1630   LEUKOCYTESUR NEGATIVE 11/25/2016 1630   Sepsis Labs: @LABRCNTIP (procalcitonin:4,lacticidven:4)  ) Recent Results (from the past 240 hour(s))  MRSA PCR Screening     Status: None   Collection Time: 08/17/17 11:53 PM  Result Value Ref Range Status   MRSA by PCR NEGATIVE NEGATIVE Final    Comment:         The GeneXpert MRSA Assay (FDA approved for NASAL specimens only), is one component of a comprehensive MRSA colonization surveillance program. It is not intended to diagnose MRSA infection nor to guide or monitor treatment for MRSA infections.   C difficile quick scan w PCR reflex     Status: None   Collection Time: 08/18/17 12:51 AM  Result Value Ref Range Status   C Diff antigen NEGATIVE NEGATIVE Final   C Diff toxin NEGATIVE NEGATIVE Final   C Diff interpretation No C. difficile detected.  Final      Studies: No results found.  Scheduled Meds: . acetaminophen      . amLODipine  10 mg Oral Daily  .  carvedilol  25 mg Oral BID WC  . furosemide  40 mg Oral Daily  . isosorbide mononitrate  30 mg Oral BID  . lisinopril  40 mg Oral QHS  . sodium chloride flush  3 mL Intravenous Q12H    Continuous Infusions: . sodium chloride       LOS: 0 days     Desiree Hane, MD [facility] Pager [phone]  If 7PM-7AM, please contact night-coverage [email or web address] Password [email or web address] 08/18/2017, 4:02 PM

## 2017-08-19 DIAGNOSIS — E8779 Other fluid overload: Principal | ICD-10-CM

## 2017-08-19 DIAGNOSIS — R0902 Hypoxemia: Secondary | ICD-10-CM

## 2017-08-19 LAB — RENAL FUNCTION PANEL
Albumin: 2.8 g/dL — ABNORMAL LOW (ref 3.5–5.0)
Anion gap: 13 (ref 5–15)
BUN: 46 mg/dL — ABNORMAL HIGH (ref 6–20)
CHLORIDE: 94 mmol/L — AB (ref 101–111)
CO2: 24 mmol/L (ref 22–32)
CREATININE: 6.27 mg/dL — AB (ref 0.44–1.00)
Calcium: 7.6 mg/dL — ABNORMAL LOW (ref 8.9–10.3)
GFR calc non Af Amer: 6 mL/min — ABNORMAL LOW (ref >60)
GFR, EST AFRICAN AMERICAN: 7 mL/min — AB (ref >60)
Glucose, Bld: 95 mg/dL (ref 65–99)
Phosphorus: 5.5 mg/dL — ABNORMAL HIGH (ref 2.5–4.6)
Potassium: 3.6 mmol/L (ref 3.5–5.1)
Sodium: 131 mmol/L — ABNORMAL LOW (ref 135–145)

## 2017-08-19 LAB — CBC
HEMATOCRIT: 20.7 % — AB (ref 36.0–46.0)
HEMATOCRIT: 30.1 % — AB (ref 36.0–46.0)
HEMOGLOBIN: 6.6 g/dL — AB (ref 12.0–15.0)
Hemoglobin: 9.5 g/dL — ABNORMAL LOW (ref 12.0–15.0)
MCH: 29.9 pg (ref 26.0–34.0)
MCH: 29 pg (ref 26.0–34.0)
MCHC: 31.9 g/dL (ref 30.0–36.0)
MCHC: 31.6 g/dL (ref 30.0–36.0)
MCV: 93.7 fL (ref 78.0–100.0)
MCV: 91.8 fL (ref 78.0–100.0)
PLATELETS: 74 10*3/uL — AB (ref 150–400)
PLATELETS: 79 10*3/uL — AB (ref 150–400)
RBC: 2.21 MIL/uL — AB (ref 3.87–5.11)
RBC: 3.28 MIL/uL — ABNORMAL LOW (ref 3.87–5.11)
RDW: 17.4 % — ABNORMAL HIGH (ref 11.5–15.5)
RDW: 17.7 % — AB (ref 11.5–15.5)
WBC: 2.3 10*3/uL — ABNORMAL LOW (ref 4.0–10.5)
WBC: 2.8 10*3/uL — AB (ref 4.0–10.5)

## 2017-08-19 LAB — PREPARE RBC (CROSSMATCH)

## 2017-08-19 LAB — ABO/RH: ABO/RH(D): B POS

## 2017-08-19 LAB — HEPATITIS B SURFACE ANTIBODY,QUALITATIVE: HEP B S AB: REACTIVE

## 2017-08-19 LAB — HEPATITIS B CORE ANTIBODY, TOTAL: HEP B C TOTAL AB: POSITIVE — AB

## 2017-08-19 MED ORDER — SODIUM CHLORIDE 0.9 % IV SOLN
Freq: Once | INTRAVENOUS | Status: AC
  Administered 2017-08-19: 12:00:00 via INTRAVENOUS

## 2017-08-19 MED ORDER — PENTAFLUOROPROP-TETRAFLUOROETH EX AERO: 1.0000 "application " | INHALATION_SPRAY | CUTANEOUS | Status: DC | PRN

## 2017-08-19 MED ORDER — ALTEPLASE 2 MG IJ SOLR: 2.0000 mg | Freq: Once | INTRAMUSCULAR | Status: DC | PRN

## 2017-08-19 MED ORDER — LIDOCAINE-PRILOCAINE 2.5-2.5 % EX CREA: 1.0000 "application " | TOPICAL_CREAM | CUTANEOUS | Status: DC | PRN

## 2017-08-19 MED ORDER — SODIUM CHLORIDE 0.9 % IV SOLN: 100.0000 mL | INTRAVENOUS | Status: DC | PRN

## 2017-08-19 MED ORDER — HEPARIN SODIUM (PORCINE) 1000 UNIT/ML DIALYSIS: 1000.0000 [IU] | INTRAMUSCULAR | Status: DC | PRN

## 2017-08-19 MED ORDER — HEPARIN SODIUM (PORCINE) 1000 UNIT/ML DIALYSIS: 4000.0000 [IU] | Freq: Once | INTRAMUSCULAR | Status: DC

## 2017-08-19 MED ORDER — LIDOCAINE HCL (PF) 1 % IJ SOLN: 5.0000 mL | INTRAMUSCULAR | Status: DC | PRN

## 2017-08-19 NOTE — Progress Notes (Signed)
[facility] Progress Note  Subjective: feeling much better, breathing much better.  On HD now.   Vitals:   08/19/17 0800 08/19/17 0830 08/19/17 0900 08/19/17 0930  BP: 120/60 (!) 140/52 (!) 131/47 (!) 130/47  Pulse: 63 63 60 60  Resp:      Temp:      TempSrc:      SpO2:      Weight:      Height:        Inpatient medications: . amLODipine  10 mg Oral Daily  . carvedilol  25 mg Oral BID WC  . furosemide  40 mg Oral Daily  . [START ON 08/20/2017] heparin  4,000 Units Dialysis Once in dialysis  . isosorbide mononitrate  30 mg Oral BID  . lisinopril  40 mg Oral QHS  . sodium chloride flush  3 mL Intravenous Q12H   . sodium chloride    . sodium chloride    . sodium chloride    . sodium chloride     sodium chloride, sodium chloride, sodium chloride, acetaminophen **OR** acetaminophen, alteplase, heparin, lidocaine (PF), lidocaine-prilocaine, ondansetron **OR** ondansetron (ZOFRAN) IV, pentafluoroprop-tetrafluoroeth, sodium chloride flush  Exam: Gen alert, no distress, elderly AAF No rash, cyanosis or gangrene Sclera anicteric, throat clear  +JVD to mid neck sitting at 90 deg Chest bilat rales 1/2 on R, 1/3 on L Cor reg w, no MRG Abd soft mildly protuberant, no hsm, nontender GU defer MS no joint effusions or deformity Ext 1+ bilat LE pitting edema / no wounds or ulcers Neuro is alert, Ox 3 , nf   HOme meds:  -norvasc 10/ coreg 25 bid/ lasix 40 qd/ imdur 30/ zestril 40 qd/ MVI  Dialysis: MWF [facility] 3.5h   64kg   2/2   Hep 5000   L arm AVG  - Hx hep B + Ab - she has old lab evidence here of natural immunity (Core/ Surf Ab +, IgM Core neg, Ag neg), NOT active infection    Impression: 1. Hypoxemia/ dyspnea - vol overload improving, SOB much better. Plan extra HD today, lowering dry wt.   2. ESRD on HD MWF 3. HTN - continues on home meds (3 bp meds, lasix and nitrates) 4. Anemia of CKD - Hb 6.6 , will transfuse with HD today 2u    Plan - HD today,  max UF and give 2u prbc's   Kelly Splinter MD [facility] pager [phone]   08/19/2017, 10:03 AM    Recent Labs Lab 08/17/17 0623 08/18/17 0623 08/19/17 0843  NA 139 136 131*  K 4.8 4.7 3.6  CL 100* 99* 94*  CO2 24 22 24   GLUCOSE 106* 111* 95  BUN 57* 68* 46*  CREATININE 7.53* 8.78* 6.27*  CALCIUM 8.4* 8.1* 7.6*  PHOS  --  6.2* 5.5*    Recent Labs Lab 08/15/17 1251 08/18/17 0623 08/19/17 0843  AST 40  --   --   ALT 24  --   --   ALKPHOS 102  --   --   BILITOT 0.9  --   --   PROT 7.4  --   --   ALBUMIN 3.5 2.9* 2.8*    Recent Labs Lab 08/17/17 0623 08/18/17 0623 08/19/17 0843  WBC 4.9 2.9* 2.3*  NEUTROABS 4.0  --   --   HGB 8.6* 7.1* 6.6*  HCT 27.0* 22.7* 20.7*  MCV 96.4 95.4 93.7  PLT 104* 83* 74*   Iron/TIBC/Ferritin/ %Sat    Component  Value Date/Time   IRON 84 05/27/2014 1500   TIBC 202 (L) 05/27/2014 1500   IRONPCTSAT 42 05/27/2014 1500

## 2017-08-19 NOTE — Progress Notes (Signed)
PROGRESS NOTE  [name] ACCARDI ZWC:[id number] DOB: 04-08-1941 DOA: 08/17/2017 PCP: Ann Held, DO  HPI/Recap of past 24 hours: Ms. Mcauliff is a 76 year old female with medical history significant for ESRD on HD MWF, type 2 diabetes, hypertension who presented to the hospital with cough, diarrhea found to have pulmonary edema in the setting of Ms. dialysis.  Patient states she missed her most recent session of HD due to significant diarrhea.  Denies any recent sick contacts.  Denies any fevers or chills. Patient again received HD session this morning which she tolerated without any complications.  Denies fevers, chills, abdominal pain.  Reports improved appetite.  We will monitor her usual self.  Assessment/Plan: Principal Problem:   Acute respiratory failure with hypoxia (HCC) Active Problems:   Essential hypertension   Chronic kidney disease, stage IV (severe) (HCC)   Pulmonary edema   Chronic diastolic heart failure ([place])   Dialysis patient, noncompliant (HCC)   Respiratory failure with hypoxia (HCC)   Volume overload    Acute hypoxic respiratory failure, secondary to missed HD, improving Significantly improved with 2 HD sessions and admission to date.  Patient euvolemic status. -Per nephrology barring to evaluate   Diarrhea, suspect viral etiology, resolved Abdominal pain, resolved Likely viral in nature given normal lab workup and resolution with supportive care   ESRD on HD on MWF  Has received 2 consecutive HD sessions on admission. -Goal per nephrology to lower dry weight -Continue Lasix 40 mg daily  Anemia of chronic disease Baseline hemoglobin 7-8.  Hemoglobin 6.6 this a.m., received 2 units after HD session completed -Posttransfusion CBC  Hypertension, currently at goal -Carvedilol 25 mg twice daily, amlodipine 10 mg daily, indoor 30 mg twice daily  Code Status: Full code   Family Communication: No family at bedside   Disposition Plan:  Follow-up CBC, goal new dry weight potentially home tomorrow   Consultants:  Nephrology  Procedures:  None  Antimicrobials:  None  DVT prophylaxis: SCDs   Objective: Vitals:   08/19/17 1253 08/19/17 1520 08/19/17 1546 08/19/17 1753  BP: (!) 142/50 (!) 129/48 (!) 121/45 (!) 170/52  Pulse: 67 73 83 79  Resp: 18 18 18 18   Temp: 98.4 F (36.9 C) 98.9 F (37.2 C) 98.4 F (36.9 C) 98.2 F (36.8 C)  TempSrc: Oral Oral Oral Oral  SpO2: 100% 99% 100% 98%  Weight:      Height:        Intake/Output Summary (Last 24 hours) at 08/19/17 2019 Last data filed at 08/19/17 1752  Gross per 24 hour  Intake              750 ml  Output             2601 ml  Net            -1851 ml   Filed Weights   08/18/17 2054 08/19/17 0730 08/19/17 1034  Weight: 62.3 kg (137 lb 5.6 oz) 62.3 kg (137 lb 5.6 oz) 59 kg (130 lb 1.1 oz)    Exam:   General: Lying in bed, eating a sandwich, pleasant conversation  Cardiovascular: Regular rate and rhythm, systolic ejection murmur heard in right upper stern  Respiratory: Lungs clear to auscultation on anterior chest feel, difficult to appreciate lung sounds posteriorly  Abdomen: Soft, nondistended, nontender, normoactive bowel sounds  Musculoskeletal: Normal range of motion  Skin: Dry and intact  Psychiatry: Normal affect and mood   Data Reviewed: CBC:  Recent Labs Lab  08/15/17 1251 08/17/17 0623 08/18/17 0623 08/19/17 0843  WBC 4.5 4.9 2.9* 2.3*  NEUTROABS  --  4.0  --   --   HGB 8.4* 8.6* 7.1* 6.6*  HCT 26.3* 27.0* 22.7* 20.7*  MCV 98.1 96.4 95.4 93.7  PLT 96* 104* 83* 74*   Basic Metabolic Panel:  Recent Labs Lab 08/15/17 1251 08/17/17 0623 08/18/17 0623 08/19/17 0843  NA 140 139 136 131*  K 4.2 4.8 4.7 3.6  CL 99* 100* 99* 94*  CO2 28 24 22 24   GLUCOSE 103* 106* 111* 95  BUN 36* 57* 68* 46*  CREATININE 5.59* 7.53* 8.78* 6.27*  CALCIUM 8.5* 8.4* 8.1* 7.6*  PHOS  --   --  6.2* 5.5*   GFR: Estimated Creatinine  Clearance: 7.1 mL/min (A) (by C-G formula based on SCr of 6.27 mg/dL (H)). Liver Function Tests:  Recent Labs Lab 08/15/17 1251 08/18/17 0623 08/19/17 0843  AST 40  --   --   ALT 24  --   --   ALKPHOS 102  --   --   BILITOT 0.9  --   --   PROT 7.4  --   --   ALBUMIN 3.5 2.9* 2.8*    Recent Labs Lab 08/15/17 1251  LIPASE 28   No results for input(s): AMMONIA in the last 168 hours. Coagulation Profile: No results for input(s): INR, PROTIME in the last 168 hours. Cardiac Enzymes: No results for input(s): CKTOTAL, CKMB, CKMBINDEX, TROPONINI in the last 168 hours. BNP (last 3 results) No results for input(s): PROBNP in the last 8760 hours. HbA1C: No results for input(s): HGBA1C in the last 72 hours. CBG: No results for input(s): GLUCAP in the last 168 hours. Lipid Profile: No results for input(s): CHOL, HDL, LDLCALC, TRIG, CHOLHDL, LDLDIRECT in the last 72 hours. Thyroid Function Tests: No results for input(s): TSH, T4TOTAL, FREET4, T3FREE, THYROIDAB in the last 72 hours. Anemia Panel: No results for input(s): VITAMINB12, FOLATE, FERRITIN, TIBC, IRON, RETICCTPCT in the last 72 hours. Urine analysis:    Component Value Date/Time   COLORURINE YELLOW 11/25/2016 1630   APPEARANCEUR CLEAR 11/25/2016 1630   LABSPEC 1.010 11/25/2016 1630   PHURINE 8.0 11/25/2016 1630   GLUCOSEU NEGATIVE 11/25/2016 1630   HGBUR NEGATIVE 11/25/2016 1630   BILIRUBINUR NEGATIVE 11/25/2016 1630   KETONESUR NEGATIVE 11/25/2016 1630   PROTEINUR >=300 (A) 11/25/2016 1630   UROBILINOGEN 2.0 (H) 04/01/2008 1751   NITRITE NEGATIVE 11/25/2016 1630   LEUKOCYTESUR NEGATIVE 11/25/2016 1630   Sepsis Labs: @LABRCNTIP (procalcitonin:4,lacticidven:4)  ) Recent Results (from the past 240 hour(s))  MRSA PCR Screening     Status: None   Collection Time: 08/17/17 11:53 PM  Result Value Ref Range Status   MRSA by PCR NEGATIVE NEGATIVE Final    Comment:        The GeneXpert MRSA Assay (FDA approved for  NASAL specimens only), is one component of a comprehensive MRSA colonization surveillance program. It is not intended to diagnose MRSA infection nor to guide or monitor treatment for MRSA infections.   C difficile quick scan w PCR reflex     Status: None   Collection Time: 08/18/17 12:51 AM  Result Value Ref Range Status   C Diff antigen NEGATIVE NEGATIVE Final   C Diff toxin NEGATIVE NEGATIVE Final   C Diff interpretation No C. difficile detected.  Final      Studies: No results found.  Scheduled Meds: . amLODipine  10 mg Oral Daily  . carvedilol  25 mg Oral BID WC  . furosemide  40 mg Oral Daily  . isosorbide mononitrate  30 mg Oral BID  . lisinopril  40 mg Oral QHS  . sodium chloride flush  3 mL Intravenous Q12H    Continuous Infusions: . sodium chloride       LOS: 1 day     Desiree Hane, MD [facility] Pager [phone]  If 7PM-7AM, please contact night-coverage [email or web address] Password [email or web address] 08/19/2017, 8:19 PM

## 2017-08-20 LAB — BPAM RBC
Blood Product Expiration Date: 201811062359
Blood Product Expiration Date: 201811062359
ISSUE DATE / TIME: 201810201226
ISSUE DATE / TIME: 201810201519
UNIT TYPE AND RH: 7300
Unit Type and Rh: 7300

## 2017-08-20 LAB — TYPE AND SCREEN
ABO/RH(D): B POS
ANTIBODY SCREEN: NEGATIVE
UNIT DIVISION: 0
Unit division: 0

## 2017-08-20 NOTE — Discharge Summary (Signed)
Discharge Summary  [name] [name] XMI:[id number] DOB: 12-Oct-1941  PCP: [name] Held, DO  Admit date: 08/17/2017 Discharge date: 08/20/2017  Time spent: 25 minutes  Recommendations for Outpatient Follow-up:  1. On PCP follow-up repeat CBC to check hemoglobin.  Discharge Diagnoses:  Active Hospital Problems   Diagnosis Date Noted  . Acute respiratory failure with hypoxia ([place]) 11/25/2016  . Respiratory failure with hypoxia ([place]) 08/18/2017  . Volume overload 08/18/2017  . Dialysis patient, noncompliant ([place]) 08/17/2017  . Chronic diastolic heart failure ([place]) 11/25/2016  . Pulmonary edema 11/25/2016  . Chronic kidney disease, stage IV (severe) ([place]) 12/05/2013  . Essential hypertension 09/13/2007    Resolved Hospital Problems   Diagnosis Date Noted Date Resolved  No resolved problems to display.    Discharge Condition: Fair  Diet recommendation: Diabetic diet  Vitals:   08/20/17 0815 08/20/17 1113  BP: (!) 150/50 (!) 129/52  Pulse: 69   Resp:    Temp:    SpO2:      History of present illness:  [name] [name] is a 76 year old female with medical history significant for ESRD on HD MWF,Type 2 diabetes, hypertension, anemia of chronic disease who presented on 08/17/2017 with 3 days of diarrhea.  Patient was found to have acute hypoxic respiratory failure secondary to missing her dialysis sessions in the setting of likely viral diarrhea.  Hospital Course:  Principal Problem:   Acute respiratory failure with hypoxia (HCC) Active Problems:   Essential hypertension   Chronic kidney disease, stage IV (severe) (HCC)   Pulmonary edema   Chronic diastolic heart failure ([place])   Dialysis patient, noncompliant (HCC)   Respiratory failure with hypoxia (HCC)   Volume overload   Acute hypoxic respiratory failure, secondary to missed hemodialysis sessions Volume Overload Chronic diastolic heart failure Pulmonary edema In the ED patient was found to be afebrile,  tachypneic with respiratory rate 20s-30s, oxygen saturation 90-100%, blood pressure 189/64-160 4/47.White count was unremarkable at 4.9, hemoglobin 8.6 consistent with previous baseline.  BMP significant for BUN 57, creatinine 7.53.  Chest x-ray showed mild pulmonary vascular congestion and small bilateral pleural effusions.  In the ED patient was given carvedilol, oral Lasix, amlodipine.  Patient was sent from ED to dialysis and then admitted to hospital service. Patient received 2 consecutive dialysis sessions during hospital course.  She reported significantly improved respiratory status with resolution of her shortness of breath.  Diarrhea, likely etiology viral Presented with 3 days worth of symptoms. Patient denied any recent sick contacts.  Patient remained afebrile during admission and diarrhea improved with supportive care    Acute on chronic anemia (anemia of chronic disease)  patient's baseline hemoglobin 7-8.  Hemoglobin on second day of hospitalization 6.6.  Patient received 2 units of blood.  Hemoglobin responded appropriately.  On discharge hemoglobin of 9.5.  Procedures:  2 consecutive dialysis sessions  Consultations:  Nephrology  Discharge Exam: BP (!) 129/52   Pulse 69   Temp 98.2 F (36.8 C) (Oral)   Resp 19   Ht 5' 5.5" (1.664 m)   Wt 59.1 kg (130 lb 4.7 oz)   SpO2 100%   BMI 21.35 kg/m    General: Sitting up in bed, pleasant in conversation, no apparent distress Cardiovascular: Regular rate and rhythm, no JVD Respiratory: Lungs clear to auscultation, minimal rales at bases, no wheezing or rhonchi, on room air  Discharge Instructions You were cared for by a hospitalist during your hospital stay. If you have any questions about your discharge  medications or the care you received while you were in the hospital after you are discharged, you can call the unit and asked to speak with the hospitalist on call if the hospitalist that took care of you is not available.  Once you are discharged, your primary care physician will handle any further medical issues. Please note that NO REFILLS for any discharge medications will be authorized once you are discharged, as it is imperative that you return to your primary care physician (or establish a relationship with a primary care physician if you do not have one) for your aftercare needs so that they can reassess your need for medications and monitor your lab values.  Discharge Instructions    Diet - low sodium heart healthy    Complete by:  As directed    Increase activity slowly    Complete by:  As directed      Allergies as of 08/20/2017      Reactions   Hydralazine Itching   Robaxin [methocarbamol]    Dizziness       Medication List    TAKE these medications   acetaminophen 325 MG tablet Commonly known as:  TYLENOL Take 650 mg by mouth every 6 (six) hours as needed for moderate pain.   amLODipine 10 MG tablet Commonly known as:  NORVASC TAKE 1 TABLET BY MOUTH EVERY DAY   carvedilol 25 MG tablet Commonly known as:  COREG TAKE 1 TABLET (25 MG TOTAL) BY MOUTH 2 (TWO) TIMES DAILY WITH A MEAL.   furosemide 40 MG tablet Commonly known as:  LASIX TAKE 1 TABLET BY MOUTH EVERY DAY What changed:  See the new instructions.   isosorbide mononitrate 30 MG 24 hr tablet Commonly known as:  IMDUR Take 1 tablet (30 mg total) by mouth 2 (two) times daily.   lisinopril 40 MG tablet Commonly known as:  PRINIVIL,ZESTRIL Take 40 mg by mouth at bedtime.   multivitamin with minerals Tabs tablet Take 2 tablets by mouth daily.      Allergies  Allergen Reactions  . Hydralazine Itching  . Robaxin [Methocarbamol]     Dizziness       The results of significant diagnostics from this hospitalization (including imaging, microbiology, ancillary and laboratory) are listed below for reference.    Significant Diagnostic Studies: Dg Chest 2 View  Result Date: 08/17/2017 CLINICAL DATA:  Shortness of breath  for 24 hours. EXAM: CHEST  2 VIEW COMPARISON:  11/25/2016 FINDINGS: Borderline heart size and pulmonary vascularity. Bilateral perihilar and basilar infiltrates, most prominent on the right. This may represent edema or bilateral pneumonia. Small bilateral pleural effusions with fluid in the fissures. No pneumothorax. Calcification of the aorta. Surgical clips in the right upper quadrant. Vascular graft in the left axilla. IMPRESSION: Cardiac enlargement with mild pulmonary vascular congestion. Bilateral perihilar and basilar airspace disease, most prominent in the right. Changes may represent edema or bilateral pneumonia. Small bilateral pleural effusions. Electronically Signed   By: Lucienne Capers M.D.   On: 08/17/2017 06:06    Microbiology: Recent Results (from the past 240 hour(s))  MRSA PCR Screening     Status: None   Collection Time: 08/17/17 11:53 PM  Result Value Ref Range Status   MRSA by PCR NEGATIVE NEGATIVE Final    Comment:        The GeneXpert MRSA Assay (FDA approved for NASAL specimens only), is one component of a comprehensive MRSA colonization surveillance program. It is not intended to diagnose MRSA infection nor  to guide or monitor treatment for MRSA infections.   C difficile quick scan w PCR reflex     Status: None   Collection Time: 08/18/17 12:51 AM  Result Value Ref Range Status   C Diff antigen NEGATIVE NEGATIVE Final   C Diff toxin NEGATIVE NEGATIVE Final   C Diff interpretation No C. difficile detected.  Final     Labs: Basic Metabolic Panel:  Recent Labs Lab 08/15/17 1251 08/17/17 0623 08/18/17 0623 08/19/17 0843  NA 140 139 136 131*  K 4.2 4.8 4.7 3.6  CL 99* 100* 99* 94*  CO2 28 24 22 24   GLUCOSE 103* 106* 111* 95  BUN 36* 57* 68* 46*  CREATININE 5.59* 7.53* 8.78* 6.27*  CALCIUM 8.5* 8.4* 8.1* 7.6*  PHOS  --   --  6.2* 5.5*   Liver Function Tests:  Recent Labs Lab 08/15/17 1251 08/18/17 0623 08/19/17 0843  AST 40  --   --   ALT 24   --   --   ALKPHOS 102  --   --   BILITOT 0.9  --   --   PROT 7.4  --   --   ALBUMIN 3.5 2.9* 2.8*    Recent Labs Lab 08/15/17 1251  LIPASE 28   No results for input(s): AMMONIA in the last 168 hours. CBC:  Recent Labs Lab 08/15/17 1251 08/17/17 0623 08/18/17 0623 08/19/17 0843 08/19/17 1959  WBC 4.5 4.9 2.9* 2.3* 2.8*  NEUTROABS  --  4.0  --   --   --   HGB 8.4* 8.6* 7.1* 6.6* 9.5*  HCT 26.3* 27.0* 22.7* 20.7* 30.1*  MCV 98.1 96.4 95.4 93.7 91.8  PLT 96* 104* 83* 74* 79*   Cardiac Enzymes: No results for input(s): CKTOTAL, CKMB, CKMBINDEX, TROPONINI in the last 168 hours. BNP: BNP (last 3 results) No results for input(s): BNP in the last 8760 hours.  ProBNP (last 3 results) No results for input(s): PROBNP in the last 8760 hours.  CBG: No results for input(s): GLUCAP in the last 168 hours.     Signed:  Desiree Hane, MD [facility] 08/20/2017, 11:53 AM

## 2017-08-20 NOTE — Progress Notes (Signed)
[facility] Progress Note  Subjective: had HD again yesterday, 2.6 L off, feeling much better today.  Wants to go home.   Vitals:   08/19/17 2055 08/20/17 0500 08/20/17 0557 08/20/17 0815  BP: (!) 135/46  (!) 150/89 (!) 150/50  Pulse: 71  77 69  Resp: 19  19   Temp: 98.9 F (37.2 C)  98.2 F (36.8 C)   TempSrc: Oral  Oral   SpO2: 99%  100%   Weight: 59.1 kg (130 lb 4.7 oz) 59.1 kg (130 lb 4.7 oz)    Height:        Inpatient medications: . amLODipine  10 mg Oral Daily  . carvedilol  25 mg Oral BID WC  . furosemide  40 mg Oral Daily  . isosorbide mononitrate  30 mg Oral BID  . lisinopril  40 mg Oral QHS  . sodium chloride flush  3 mL Intravenous Q12H   . sodium chloride     sodium chloride, acetaminophen **OR** acetaminophen, ondansetron **OR** ondansetron (ZOFRAN) IV, sodium chloride flush  Exam: Gen alert, no distress, elderly AAF No rash, cyanosis or gangrene Sclera anicteric, throat clear  +JVD to mid neck sitting at 90 deg Chest bilat rales 1/2 on R, 1/3 on L Cor reg w, no MRG Abd soft mildly protuberant, no hsm, nontender GU defer MS no joint effusions or deformity Ext 1+ bilat LE pitting edema / no wounds or ulcers Neuro is alert, Ox 3 , nf   HOme meds:  -norvasc 10/ coreg 25 bid/ lasix 40 qd/ imdur 30/ zestril 40 qd/ MVI  Dialysis: MWF [facility] 3.5h   64kg   2/2   Hep 5000   L arm AVG  - Hx hep B + Ab - she has old lab evidence here of natural immunity (Core/ Surf Ab +, IgM Core neg, Ag neg), NOT active infection    Impression: 1. Hypoxemia/ dyspnea - vol overload resolved, 5kg under prior dry wt. [place] for [employer], have d/w primary MD   2. ESRD on HD MWF 3. HTN - continues on home meds (3 bp meds, lasix and nitrates) 4. Anemia of CKD - Hb 6.6 , will transfuse with HD today 2u    Plan - dc home, decrease dry wt to [place] pager [phone]   08/20/2017, 10:30 AM    Recent Labs Lab  08/17/17 0623 08/18/17 0623 08/19/17 0843  NA 139 136 131*  K 4.8 4.7 3.6  CL 100* 99* 94*  CO2 24 22 24   GLUCOSE 106* 111* 95  BUN 57* 68* 46*  CREATININE 7.53* 8.78* 6.27*  CALCIUM 8.4* 8.1* 7.6*  PHOS  --  6.2* 5.5*    Recent Labs Lab 08/15/17 1251 08/18/17 0623 08/19/17 0843  AST 40  --   --   ALT 24  --   --   ALKPHOS 102  --   --   BILITOT 0.9  --   --   PROT 7.4  --   --   ALBUMIN 3.5 2.9* 2.8*    Recent Labs Lab 08/17/17 0623 08/18/17 0623 08/19/17 0843 08/19/17 1959  WBC 4.9 2.9* 2.3* 2.8*  NEUTROABS 4.0  --   --   --   HGB 8.6* 7.1* 6.6* 9.5*  HCT 27.0* 22.7* 20.7* 30.1*  MCV 96.4 95.4 93.7 91.8  PLT 104* 83* 74* 79*   Iron/TIBC/Ferritin/ %Sat    Component Value Date/Time   IRON 84 05/27/2014 1500  TIBC 202 (L) 05/27/2014 1500   IRONPCTSAT 42 05/27/2014 1500

## 2017-08-21 DIAGNOSIS — N186 End stage renal disease: Secondary | ICD-10-CM | POA: Diagnosis not present

## 2017-08-21 DIAGNOSIS — N2581 Secondary hyperparathyroidism of renal origin: Secondary | ICD-10-CM | POA: Diagnosis not present

## 2017-08-21 DIAGNOSIS — E119 Type 2 diabetes mellitus without complications: Secondary | ICD-10-CM | POA: Diagnosis not present

## 2017-08-21 DIAGNOSIS — D509 Iron deficiency anemia, unspecified: Secondary | ICD-10-CM | POA: Diagnosis not present

## 2017-08-21 DIAGNOSIS — D631 Anemia in chronic kidney disease: Secondary | ICD-10-CM | POA: Diagnosis not present

## 2017-08-21 DIAGNOSIS — D689 Coagulation defect, unspecified: Secondary | ICD-10-CM | POA: Diagnosis not present

## 2017-08-23 DIAGNOSIS — D689 Coagulation defect, unspecified: Secondary | ICD-10-CM | POA: Diagnosis not present

## 2017-08-23 DIAGNOSIS — D631 Anemia in chronic kidney disease: Secondary | ICD-10-CM | POA: Diagnosis not present

## 2017-08-23 DIAGNOSIS — E119 Type 2 diabetes mellitus without complications: Secondary | ICD-10-CM | POA: Diagnosis not present

## 2017-08-23 DIAGNOSIS — N186 End stage renal disease: Secondary | ICD-10-CM | POA: Diagnosis not present

## 2017-08-23 DIAGNOSIS — D509 Iron deficiency anemia, unspecified: Secondary | ICD-10-CM | POA: Diagnosis not present

## 2017-08-23 DIAGNOSIS — N2581 Secondary hyperparathyroidism of renal origin: Secondary | ICD-10-CM | POA: Diagnosis not present

## 2017-08-25 DIAGNOSIS — D631 Anemia in chronic kidney disease: Secondary | ICD-10-CM | POA: Diagnosis not present

## 2017-08-25 DIAGNOSIS — N2581 Secondary hyperparathyroidism of renal origin: Secondary | ICD-10-CM | POA: Diagnosis not present

## 2017-08-25 DIAGNOSIS — D689 Coagulation defect, unspecified: Secondary | ICD-10-CM | POA: Diagnosis not present

## 2017-08-25 DIAGNOSIS — N186 End stage renal disease: Secondary | ICD-10-CM | POA: Diagnosis not present

## 2017-08-25 DIAGNOSIS — D509 Iron deficiency anemia, unspecified: Secondary | ICD-10-CM | POA: Diagnosis not present

## 2017-08-25 DIAGNOSIS — E119 Type 2 diabetes mellitus without complications: Secondary | ICD-10-CM | POA: Diagnosis not present

## 2017-08-28 DIAGNOSIS — N186 End stage renal disease: Secondary | ICD-10-CM | POA: Diagnosis not present

## 2017-08-28 DIAGNOSIS — D689 Coagulation defect, unspecified: Secondary | ICD-10-CM | POA: Diagnosis not present

## 2017-08-28 DIAGNOSIS — D631 Anemia in chronic kidney disease: Secondary | ICD-10-CM | POA: Diagnosis not present

## 2017-08-28 DIAGNOSIS — D509 Iron deficiency anemia, unspecified: Secondary | ICD-10-CM | POA: Diagnosis not present

## 2017-08-28 DIAGNOSIS — N2581 Secondary hyperparathyroidism of renal origin: Secondary | ICD-10-CM | POA: Diagnosis not present

## 2017-08-28 DIAGNOSIS — E119 Type 2 diabetes mellitus without complications: Secondary | ICD-10-CM | POA: Diagnosis not present

## 2017-08-29 ENCOUNTER — Telehealth: Payer: Self-pay

## 2017-08-29 NOTE — Telephone Encounter (Signed)
Called patient to schedule appointment for Hospital follow up. Left message for return call.

## 2017-08-30 DIAGNOSIS — D689 Coagulation defect, unspecified: Secondary | ICD-10-CM | POA: Diagnosis not present

## 2017-08-30 DIAGNOSIS — N186 End stage renal disease: Secondary | ICD-10-CM | POA: Diagnosis not present

## 2017-08-30 DIAGNOSIS — Z992 Dependence on renal dialysis: Secondary | ICD-10-CM | POA: Diagnosis not present

## 2017-08-30 DIAGNOSIS — I120 Hypertensive chronic kidney disease with stage 5 chronic kidney disease or end stage renal disease: Secondary | ICD-10-CM | POA: Diagnosis not present

## 2017-08-30 DIAGNOSIS — N2581 Secondary hyperparathyroidism of renal origin: Secondary | ICD-10-CM | POA: Diagnosis not present

## 2017-08-30 DIAGNOSIS — D631 Anemia in chronic kidney disease: Secondary | ICD-10-CM | POA: Diagnosis not present

## 2017-08-30 DIAGNOSIS — D509 Iron deficiency anemia, unspecified: Secondary | ICD-10-CM | POA: Diagnosis not present

## 2017-08-30 DIAGNOSIS — E119 Type 2 diabetes mellitus without complications: Secondary | ICD-10-CM | POA: Diagnosis not present

## 2017-09-01 DIAGNOSIS — D509 Iron deficiency anemia, unspecified: Secondary | ICD-10-CM | POA: Diagnosis not present

## 2017-09-01 DIAGNOSIS — D689 Coagulation defect, unspecified: Secondary | ICD-10-CM | POA: Diagnosis not present

## 2017-09-01 DIAGNOSIS — D631 Anemia in chronic kidney disease: Secondary | ICD-10-CM | POA: Diagnosis not present

## 2017-09-01 DIAGNOSIS — N2581 Secondary hyperparathyroidism of renal origin: Secondary | ICD-10-CM | POA: Diagnosis not present

## 2017-09-01 DIAGNOSIS — N186 End stage renal disease: Secondary | ICD-10-CM | POA: Diagnosis not present

## 2017-09-04 DIAGNOSIS — D631 Anemia in chronic kidney disease: Secondary | ICD-10-CM | POA: Diagnosis not present

## 2017-09-04 DIAGNOSIS — N186 End stage renal disease: Secondary | ICD-10-CM | POA: Diagnosis not present

## 2017-09-04 DIAGNOSIS — N2581 Secondary hyperparathyroidism of renal origin: Secondary | ICD-10-CM | POA: Diagnosis not present

## 2017-09-04 DIAGNOSIS — D689 Coagulation defect, unspecified: Secondary | ICD-10-CM | POA: Diagnosis not present

## 2017-09-04 DIAGNOSIS — D509 Iron deficiency anemia, unspecified: Secondary | ICD-10-CM | POA: Diagnosis not present

## 2017-09-06 DIAGNOSIS — D509 Iron deficiency anemia, unspecified: Secondary | ICD-10-CM | POA: Diagnosis not present

## 2017-09-06 DIAGNOSIS — D631 Anemia in chronic kidney disease: Secondary | ICD-10-CM | POA: Diagnosis not present

## 2017-09-06 DIAGNOSIS — N2581 Secondary hyperparathyroidism of renal origin: Secondary | ICD-10-CM | POA: Diagnosis not present

## 2017-09-06 DIAGNOSIS — N186 End stage renal disease: Secondary | ICD-10-CM | POA: Diagnosis not present

## 2017-09-06 DIAGNOSIS — D689 Coagulation defect, unspecified: Secondary | ICD-10-CM | POA: Diagnosis not present

## 2017-09-08 DIAGNOSIS — D689 Coagulation defect, unspecified: Secondary | ICD-10-CM | POA: Diagnosis not present

## 2017-09-08 DIAGNOSIS — D631 Anemia in chronic kidney disease: Secondary | ICD-10-CM | POA: Diagnosis not present

## 2017-09-08 DIAGNOSIS — N186 End stage renal disease: Secondary | ICD-10-CM | POA: Diagnosis not present

## 2017-09-08 DIAGNOSIS — D509 Iron deficiency anemia, unspecified: Secondary | ICD-10-CM | POA: Diagnosis not present

## 2017-09-08 DIAGNOSIS — N2581 Secondary hyperparathyroidism of renal origin: Secondary | ICD-10-CM | POA: Diagnosis not present

## 2017-09-11 DIAGNOSIS — D509 Iron deficiency anemia, unspecified: Secondary | ICD-10-CM | POA: Diagnosis not present

## 2017-09-11 DIAGNOSIS — D689 Coagulation defect, unspecified: Secondary | ICD-10-CM | POA: Diagnosis not present

## 2017-09-11 DIAGNOSIS — N2581 Secondary hyperparathyroidism of renal origin: Secondary | ICD-10-CM | POA: Diagnosis not present

## 2017-09-11 DIAGNOSIS — N186 End stage renal disease: Secondary | ICD-10-CM | POA: Diagnosis not present

## 2017-09-11 DIAGNOSIS — D631 Anemia in chronic kidney disease: Secondary | ICD-10-CM | POA: Diagnosis not present

## 2017-09-13 DIAGNOSIS — D689 Coagulation defect, unspecified: Secondary | ICD-10-CM | POA: Diagnosis not present

## 2017-09-13 DIAGNOSIS — N2581 Secondary hyperparathyroidism of renal origin: Secondary | ICD-10-CM | POA: Diagnosis not present

## 2017-09-13 DIAGNOSIS — N186 End stage renal disease: Secondary | ICD-10-CM | POA: Diagnosis not present

## 2017-09-13 DIAGNOSIS — D509 Iron deficiency anemia, unspecified: Secondary | ICD-10-CM | POA: Diagnosis not present

## 2017-09-13 DIAGNOSIS — D631 Anemia in chronic kidney disease: Secondary | ICD-10-CM | POA: Diagnosis not present

## 2017-09-15 DIAGNOSIS — D631 Anemia in chronic kidney disease: Secondary | ICD-10-CM | POA: Diagnosis not present

## 2017-09-15 DIAGNOSIS — D689 Coagulation defect, unspecified: Secondary | ICD-10-CM | POA: Diagnosis not present

## 2017-09-15 DIAGNOSIS — D509 Iron deficiency anemia, unspecified: Secondary | ICD-10-CM | POA: Diagnosis not present

## 2017-09-15 DIAGNOSIS — N2581 Secondary hyperparathyroidism of renal origin: Secondary | ICD-10-CM | POA: Diagnosis not present

## 2017-09-15 DIAGNOSIS — N186 End stage renal disease: Secondary | ICD-10-CM | POA: Diagnosis not present

## 2017-09-17 DIAGNOSIS — D509 Iron deficiency anemia, unspecified: Secondary | ICD-10-CM | POA: Diagnosis not present

## 2017-09-17 DIAGNOSIS — D631 Anemia in chronic kidney disease: Secondary | ICD-10-CM | POA: Diagnosis not present

## 2017-09-17 DIAGNOSIS — N2581 Secondary hyperparathyroidism of renal origin: Secondary | ICD-10-CM | POA: Diagnosis not present

## 2017-09-17 DIAGNOSIS — D689 Coagulation defect, unspecified: Secondary | ICD-10-CM | POA: Diagnosis not present

## 2017-09-17 DIAGNOSIS — N186 End stage renal disease: Secondary | ICD-10-CM | POA: Diagnosis not present

## 2017-09-18 ENCOUNTER — Other Ambulatory Visit: Payer: Self-pay | Admitting: Family Medicine

## 2017-09-18 DIAGNOSIS — I10 Essential (primary) hypertension: Secondary | ICD-10-CM

## 2017-09-19 DIAGNOSIS — N186 End stage renal disease: Secondary | ICD-10-CM | POA: Diagnosis not present

## 2017-09-19 DIAGNOSIS — D509 Iron deficiency anemia, unspecified: Secondary | ICD-10-CM | POA: Diagnosis not present

## 2017-09-19 DIAGNOSIS — D689 Coagulation defect, unspecified: Secondary | ICD-10-CM | POA: Diagnosis not present

## 2017-09-19 DIAGNOSIS — D631 Anemia in chronic kidney disease: Secondary | ICD-10-CM | POA: Diagnosis not present

## 2017-09-19 DIAGNOSIS — N2581 Secondary hyperparathyroidism of renal origin: Secondary | ICD-10-CM | POA: Diagnosis not present

## 2017-09-22 DIAGNOSIS — D509 Iron deficiency anemia, unspecified: Secondary | ICD-10-CM | POA: Diagnosis not present

## 2017-09-22 DIAGNOSIS — N186 End stage renal disease: Secondary | ICD-10-CM | POA: Diagnosis not present

## 2017-09-22 DIAGNOSIS — D631 Anemia in chronic kidney disease: Secondary | ICD-10-CM | POA: Diagnosis not present

## 2017-09-22 DIAGNOSIS — N2581 Secondary hyperparathyroidism of renal origin: Secondary | ICD-10-CM | POA: Diagnosis not present

## 2017-09-22 DIAGNOSIS — D689 Coagulation defect, unspecified: Secondary | ICD-10-CM | POA: Diagnosis not present

## 2017-09-25 DIAGNOSIS — D509 Iron deficiency anemia, unspecified: Secondary | ICD-10-CM | POA: Diagnosis not present

## 2017-09-25 DIAGNOSIS — N186 End stage renal disease: Secondary | ICD-10-CM | POA: Diagnosis not present

## 2017-09-25 DIAGNOSIS — N2581 Secondary hyperparathyroidism of renal origin: Secondary | ICD-10-CM | POA: Diagnosis not present

## 2017-09-25 DIAGNOSIS — D631 Anemia in chronic kidney disease: Secondary | ICD-10-CM | POA: Diagnosis not present

## 2017-09-25 DIAGNOSIS — D689 Coagulation defect, unspecified: Secondary | ICD-10-CM | POA: Diagnosis not present

## 2017-09-27 ENCOUNTER — Other Ambulatory Visit: Payer: Self-pay | Admitting: *Deleted

## 2017-09-27 DIAGNOSIS — N2581 Secondary hyperparathyroidism of renal origin: Secondary | ICD-10-CM | POA: Diagnosis not present

## 2017-09-27 DIAGNOSIS — N186 End stage renal disease: Secondary | ICD-10-CM | POA: Diagnosis not present

## 2017-09-27 DIAGNOSIS — D631 Anemia in chronic kidney disease: Secondary | ICD-10-CM | POA: Diagnosis not present

## 2017-09-27 DIAGNOSIS — D689 Coagulation defect, unspecified: Secondary | ICD-10-CM | POA: Diagnosis not present

## 2017-09-27 DIAGNOSIS — D509 Iron deficiency anemia, unspecified: Secondary | ICD-10-CM | POA: Diagnosis not present

## 2017-09-27 MED ORDER — LISINOPRIL 40 MG PO TABS: 40.0000 mg | ORAL_TABLET | Freq: Every day | ORAL | 1 refills | Status: DC

## 2017-09-29 DIAGNOSIS — D509 Iron deficiency anemia, unspecified: Secondary | ICD-10-CM | POA: Diagnosis not present

## 2017-09-29 DIAGNOSIS — E1122 Type 2 diabetes mellitus with diabetic chronic kidney disease: Secondary | ICD-10-CM | POA: Diagnosis not present

## 2017-09-29 DIAGNOSIS — D631 Anemia in chronic kidney disease: Secondary | ICD-10-CM | POA: Diagnosis not present

## 2017-09-29 DIAGNOSIS — Z992 Dependence on renal dialysis: Secondary | ICD-10-CM | POA: Diagnosis not present

## 2017-09-29 DIAGNOSIS — N186 End stage renal disease: Secondary | ICD-10-CM | POA: Diagnosis not present

## 2017-09-29 DIAGNOSIS — N2581 Secondary hyperparathyroidism of renal origin: Secondary | ICD-10-CM | POA: Diagnosis not present

## 2017-09-29 DIAGNOSIS — D689 Coagulation defect, unspecified: Secondary | ICD-10-CM | POA: Diagnosis not present

## 2017-10-02 DIAGNOSIS — D509 Iron deficiency anemia, unspecified: Secondary | ICD-10-CM | POA: Diagnosis not present

## 2017-10-02 DIAGNOSIS — D631 Anemia in chronic kidney disease: Secondary | ICD-10-CM | POA: Diagnosis not present

## 2017-10-02 DIAGNOSIS — N186 End stage renal disease: Secondary | ICD-10-CM | POA: Diagnosis not present

## 2017-10-02 DIAGNOSIS — D689 Coagulation defect, unspecified: Secondary | ICD-10-CM | POA: Diagnosis not present

## 2017-10-02 DIAGNOSIS — N2581 Secondary hyperparathyroidism of renal origin: Secondary | ICD-10-CM | POA: Diagnosis not present

## 2017-10-04 DIAGNOSIS — D689 Coagulation defect, unspecified: Secondary | ICD-10-CM | POA: Diagnosis not present

## 2017-10-04 DIAGNOSIS — N186 End stage renal disease: Secondary | ICD-10-CM | POA: Diagnosis not present

## 2017-10-04 DIAGNOSIS — D631 Anemia in chronic kidney disease: Secondary | ICD-10-CM | POA: Diagnosis not present

## 2017-10-04 DIAGNOSIS — D509 Iron deficiency anemia, unspecified: Secondary | ICD-10-CM | POA: Diagnosis not present

## 2017-10-04 DIAGNOSIS — N2581 Secondary hyperparathyroidism of renal origin: Secondary | ICD-10-CM | POA: Diagnosis not present

## 2017-10-06 DIAGNOSIS — D509 Iron deficiency anemia, unspecified: Secondary | ICD-10-CM | POA: Diagnosis not present

## 2017-10-06 DIAGNOSIS — D631 Anemia in chronic kidney disease: Secondary | ICD-10-CM | POA: Diagnosis not present

## 2017-10-06 DIAGNOSIS — D689 Coagulation defect, unspecified: Secondary | ICD-10-CM | POA: Diagnosis not present

## 2017-10-06 DIAGNOSIS — N2581 Secondary hyperparathyroidism of renal origin: Secondary | ICD-10-CM | POA: Diagnosis not present

## 2017-10-06 DIAGNOSIS — N186 End stage renal disease: Secondary | ICD-10-CM | POA: Diagnosis not present

## 2017-10-09 DIAGNOSIS — D689 Coagulation defect, unspecified: Secondary | ICD-10-CM | POA: Diagnosis not present

## 2017-10-09 DIAGNOSIS — N2581 Secondary hyperparathyroidism of renal origin: Secondary | ICD-10-CM | POA: Diagnosis not present

## 2017-10-09 DIAGNOSIS — D631 Anemia in chronic kidney disease: Secondary | ICD-10-CM | POA: Diagnosis not present

## 2017-10-09 DIAGNOSIS — D509 Iron deficiency anemia, unspecified: Secondary | ICD-10-CM | POA: Diagnosis not present

## 2017-10-09 DIAGNOSIS — N186 End stage renal disease: Secondary | ICD-10-CM | POA: Diagnosis not present

## 2017-10-11 DIAGNOSIS — N2581 Secondary hyperparathyroidism of renal origin: Secondary | ICD-10-CM | POA: Diagnosis not present

## 2017-10-11 DIAGNOSIS — D509 Iron deficiency anemia, unspecified: Secondary | ICD-10-CM | POA: Diagnosis not present

## 2017-10-11 DIAGNOSIS — D631 Anemia in chronic kidney disease: Secondary | ICD-10-CM | POA: Diagnosis not present

## 2017-10-11 DIAGNOSIS — D689 Coagulation defect, unspecified: Secondary | ICD-10-CM | POA: Diagnosis not present

## 2017-10-11 DIAGNOSIS — N186 End stage renal disease: Secondary | ICD-10-CM | POA: Diagnosis not present

## 2017-10-13 DIAGNOSIS — D509 Iron deficiency anemia, unspecified: Secondary | ICD-10-CM | POA: Diagnosis not present

## 2017-10-13 DIAGNOSIS — D689 Coagulation defect, unspecified: Secondary | ICD-10-CM | POA: Diagnosis not present

## 2017-10-13 DIAGNOSIS — N2581 Secondary hyperparathyroidism of renal origin: Secondary | ICD-10-CM | POA: Diagnosis not present

## 2017-10-13 DIAGNOSIS — N186 End stage renal disease: Secondary | ICD-10-CM | POA: Diagnosis not present

## 2017-10-13 DIAGNOSIS — D631 Anemia in chronic kidney disease: Secondary | ICD-10-CM | POA: Diagnosis not present

## 2017-10-16 DIAGNOSIS — D689 Coagulation defect, unspecified: Secondary | ICD-10-CM | POA: Diagnosis not present

## 2017-10-16 DIAGNOSIS — N186 End stage renal disease: Secondary | ICD-10-CM | POA: Diagnosis not present

## 2017-10-16 DIAGNOSIS — N2581 Secondary hyperparathyroidism of renal origin: Secondary | ICD-10-CM | POA: Diagnosis not present

## 2017-10-16 DIAGNOSIS — D509 Iron deficiency anemia, unspecified: Secondary | ICD-10-CM | POA: Diagnosis not present

## 2017-10-16 DIAGNOSIS — D631 Anemia in chronic kidney disease: Secondary | ICD-10-CM | POA: Diagnosis not present

## 2017-10-18 DIAGNOSIS — D631 Anemia in chronic kidney disease: Secondary | ICD-10-CM | POA: Diagnosis not present

## 2017-10-18 DIAGNOSIS — N2581 Secondary hyperparathyroidism of renal origin: Secondary | ICD-10-CM | POA: Diagnosis not present

## 2017-10-18 DIAGNOSIS — N186 End stage renal disease: Secondary | ICD-10-CM | POA: Diagnosis not present

## 2017-10-18 DIAGNOSIS — D509 Iron deficiency anemia, unspecified: Secondary | ICD-10-CM | POA: Diagnosis not present

## 2017-10-18 DIAGNOSIS — D689 Coagulation defect, unspecified: Secondary | ICD-10-CM | POA: Diagnosis not present

## 2017-10-20 ENCOUNTER — Other Ambulatory Visit: Payer: Self-pay

## 2017-10-20 ENCOUNTER — Emergency Department (HOSPITAL_COMMUNITY)
Admission: EM | Admit: 2017-10-20 | Discharge: 2017-10-20 | Disposition: A | Discharge status: Home / Self Care | Payer: Medicare HMO | Attending: Emergency Medicine | Admitting: Emergency Medicine

## 2017-10-20 ENCOUNTER — Encounter (HOSPITAL_COMMUNITY): Payer: Self-pay

## 2017-10-20 ENCOUNTER — Emergency Department (HOSPITAL_COMMUNITY): Payer: Medicare HMO

## 2017-10-20 DIAGNOSIS — W19XXXA Unspecified fall, initial encounter: Secondary | ICD-10-CM

## 2017-10-20 DIAGNOSIS — Y999 Unspecified external cause status: Secondary | ICD-10-CM | POA: Diagnosis not present

## 2017-10-20 DIAGNOSIS — S7002XA Contusion of left hip, initial encounter: Secondary | ICD-10-CM | POA: Insufficient documentation

## 2017-10-20 DIAGNOSIS — E1022 Type 1 diabetes mellitus with diabetic chronic kidney disease: Secondary | ICD-10-CM | POA: Insufficient documentation

## 2017-10-20 DIAGNOSIS — I5032 Chronic diastolic (congestive) heart failure: Secondary | ICD-10-CM | POA: Diagnosis not present

## 2017-10-20 DIAGNOSIS — Y939 Activity, unspecified: Secondary | ICD-10-CM | POA: Diagnosis not present

## 2017-10-20 DIAGNOSIS — W101XXA Fall (on)(from) sidewalk curb, initial encounter: Secondary | ICD-10-CM | POA: Diagnosis not present

## 2017-10-20 DIAGNOSIS — I132 Hypertensive heart and chronic kidney disease with heart failure and with stage 5 chronic kidney disease, or end stage renal disease: Secondary | ICD-10-CM | POA: Insufficient documentation

## 2017-10-20 DIAGNOSIS — Z992 Dependence on renal dialysis: Secondary | ICD-10-CM | POA: Insufficient documentation

## 2017-10-20 DIAGNOSIS — N186 End stage renal disease: Secondary | ICD-10-CM | POA: Diagnosis not present

## 2017-10-20 DIAGNOSIS — Y929 Unspecified place or not applicable: Secondary | ICD-10-CM | POA: Insufficient documentation

## 2017-10-20 DIAGNOSIS — Z79899 Other long term (current) drug therapy: Secondary | ICD-10-CM | POA: Diagnosis not present

## 2017-10-20 DIAGNOSIS — S79912A Unspecified injury of left hip, initial encounter: Secondary | ICD-10-CM | POA: Diagnosis not present

## 2017-10-20 DIAGNOSIS — M25552 Pain in left hip: Secondary | ICD-10-CM | POA: Diagnosis not present

## 2017-10-20 MED ORDER — CARVEDILOL 12.5 MG PO TABS
25.0000 mg | ORAL_TABLET | Freq: Two times a day (BID) | ORAL | Status: DC
  Administered 2017-10-20: 25 mg via ORAL
  Filled 2017-10-20: qty 2

## 2017-10-20 MED ORDER — AMLODIPINE BESYLATE 5 MG PO TABS
10.0000 mg | ORAL_TABLET | Freq: Every day | ORAL | Status: DC
  Administered 2017-10-20: 10 mg via ORAL
  Filled 2017-10-20: qty 2

## 2017-10-20 MED ORDER — OXYCODONE-ACETAMINOPHEN 5-325 MG PO TABS
1.0000 | ORAL_TABLET | Freq: Once | ORAL | Status: DC
  Filled 2017-10-20: qty 1

## 2017-10-20 MED ORDER — ACETAMINOPHEN 325 MG PO TABS
650.0000 mg | ORAL_TABLET | Freq: Once | ORAL | Status: AC
  Administered 2017-10-20: 650 mg via ORAL
  Filled 2017-10-20: qty 2

## 2017-10-20 NOTE — ED Notes (Signed)
Pt refused w/c from waiting to room. Pt stated it hurt worse while sitting. Pt ambulated to room.

## 2017-10-20 NOTE — ED Notes (Signed)
Patient transported to X-ray 

## 2017-10-20 NOTE — ED Notes (Signed)
EDP aware of bp.  

## 2017-10-20 NOTE — ED Provider Notes (Signed)
[facility] EMERGENCY DEPARTMENT Provider Note   CSN: [id number] Arrival date & time: 10/20/17  1027     History   Chief Complaint Chief Complaint  Patient presents with  . Fall    HPI [name] [name] is a 76 y.o. female.  HPI Patient presents after a fall.  She states 2 days ago she fell while trying to step on on a curve.  Landed on her left hip area.  States that landed on to to the curb itself.  Since then she has had pain.  States she is having difficulty walking on it.  No numbness or weakness.  No back pain.  States she normally has to push down on her right knee to be able to step up onto things.  No head or neck pain.  She is not on anticoagulation. Past Medical History:  Diagnosis Date  . DEPRESSION   . DIABETES MELLITUS, TYPE I   . GERD   . GLAUCOMA   . Headache(784.0)   . Hepatitis B carrier ([place])    05/2009: neg Hep C; Hep B: core pos, Surf neg; fatty liver US 8/10 - 7/13  . HYPERLIPIDEMIA   . HYPERTENSION   . Kidney failure    dialysis 3 times per week  . OSTEOPENIA   . POSTMENOPAUSAL STATUS   . Unspecified vitamin D deficiency     Patient Active Problem List   Diagnosis Date Noted  . Respiratory failure with hypoxia ([place]) 08/18/2017  . Volume overload 08/18/2017  . Dialysis patient, noncompliant ([place]) 08/17/2017  . Thumb pain, left 12/22/2016  . Pulmonary edema 11/25/2016  . Acute respiratory failure with hypoxia ([place]) 11/25/2016  . Chronic diastolic heart failure ([place]) 11/25/2016  . End stage renal disease ([place]) 06/25/2014  . CAP (community acquired pneumonia) 05/17/2014  . Malignant hypertension 05/17/2014  . Chest pain 05/17/2014  . Elevated troponin 05/17/2014  . Hypertensive emergency 05/17/2014  . NSTEMI (non-ST elevated myocardial infarction) ([place]) 05/17/2014  . Chronic kidney disease, stage IV (severe) ([place]) 12/05/2013  . GERD 06/17/2009  . Hepatitis B carrier ([place]) 06/17/2009  . UNSPECIFIED VITAMIN D DEFICIENCY  05/21/2009  . DEPRESSION 05/20/2009  . GLAUCOMA 05/20/2009  . HEADACHE 05/20/2009  . CARDIAC MURMUR 05/20/2009  . OSTEOPENIA 02/15/2008  . POSTMENOPAUSAL STATUS 12/21/2007  . Hyperlipidemia LDL goal <70 09/21/2007  . Essential hypertension 09/13/2007  . Type II diabetes mellitus with nephropathy [facility])     Past Surgical History:  Procedure Laterality Date  . AV FISTULA PLACEMENT Left 05/29/2014   Procedure: INSERTION OF ARTERIOVENOUS (AV) GORE-TEX GRAFT ARM-LEFT;  Surgeon: Angelia Mould, MD;  Location: [facility];  Service: Vascular;  Laterality: Left;  . CHOLECYSTECTOMY  02/12/07  . INSERTION OF DIALYSIS CATHETER Right 05/27/2014   Procedure: INSERTION OF DIALYSIS CATHETER;  Surgeon: Angelia Mould, MD;  Location: [facility];  Service: Vascular;  Laterality: Right;  . REFRACTIVE SURGERY  07/29/09   Dr. Bing Plume  . TONSILLECTOMY      OB History    No data available       Home Medications    Prior to Admission medications   Medication Sig Start Date End Date Taking? Authorizing Provider  acetaminophen (TYLENOL) 325 MG tablet Take 650 mg by mouth every 6 (six) hours as needed for moderate pain.    Yes [provider]  amLODipine (NORVASC) 10 MG tablet TAKE 1 TABLET BY MOUTH EVERY DAY Patient taking differently: TAKE 10mg  BY MOUTH EVERY DAY 12/30/15  Yes  Carollee Herter, Yvonne R, DO  carvedilol (COREG) 25 MG tablet TAKE 1 TABLET (25 MG TOTAL) BY MOUTH 2 (TWO) TIMES DAILY WITH A MEAL. 09/19/17  Yes Roma Schanz R, DO  furosemide (LASIX) 40 MG tablet TAKE 1 TABLET BY MOUTH EVERY DAY Patient taking differently: TAKE 40mg  BY MOUTH EVERY DAY 09/19/17  Yes Roma Schanz R, DO  isosorbide mononitrate (IMDUR) 30 MG 24 hr tablet Take 1 tablet (30 mg total) by mouth 2 (two) times daily. 07/07/17  Yes Roma Schanz R, DO  lisinopril (PRINIVIL,ZESTRIL) 40 MG tablet Take 1 tablet (40 mg total) by mouth at bedtime. 09/27/17  Yes Ann Held, DO  Multiple Vitamin  (MULTIVITAMIN WITH MINERALS) TABS tablet Take 2 tablets by mouth daily.   Yes [provider]    Family History Family History  Problem Relation Age of Onset  . Coronary artery disease Other   . Diabetes Other   . Hypertension Other   . Arthritis Other     Social History Social History   Tobacco Use  . Smoking status: Never Smoker  . Smokeless tobacco: Never Used  . Tobacco comment: Married x's 5yrs, 6 kids-scattered [employer]. Retired Consulting civil engineer  Substance Use Topics  . Alcohol use: No  . Drug use: No     Allergies   Hydralazine and Robaxin [methocarbamol]   Review of Systems Review of Systems  Constitutional: Negative for appetite change and fever.  Respiratory: Negative for shortness of breath.   Cardiovascular: Negative for chest pain.  Gastrointestinal: Negative for abdominal pain.  Musculoskeletal: Positive for gait problem.       Left hip pain.  Skin: Negative for wound.  Neurological: Positive for weakness. Negative for numbness.     Physical Exam Updated Vital Signs BP (!) 136/54 (BP Location: Right Arm)   Pulse 71   Temp 98.2 F (36.8 C) (Oral)   Resp 16   Ht 5\' 5"  (1.651 m)   Wt 68 kg (150 lb)   SpO2 94%   BMI 24.96 kg/m   Physical Exam  Constitutional: She appears well-developed.  HENT:  Head: Atraumatic.  Neck: Neck supple.  Cardiovascular: Normal rate.  Pulmonary/Chest: She exhibits no tenderness.  Abdominal: There is no tenderness.  Musculoskeletal: She exhibits tenderness.  Tenderness over left iliac area superiorly.  No deformity.  No ecchymosis.  Good range of motion left hip.  No lumbar tenderness.  No tenderness directly over the left superior femur.  Neurovascular intact over the bilateral feet.  Skin: Skin is warm. Capillary refill takes less than 2 seconds.  Psychiatric: She has a normal mood and affect.     ED Treatments / Results  Labs (all labs ordered are listed, but only abnormal results are  displayed) Labs Reviewed - No data to display  EKG  EKG Interpretation None       Radiology Ct Hip Left Wo Contrast  Result Date: 10/20/2017 CLINICAL DATA:  Left hip pain secondary to a fall yesterday. EXAM: CT OF THE LEFT HIP WITHOUT CONTRAST TECHNIQUE: Multidetector CT imaging of the left hip was performed according to the standard protocol. Multiplanar CT image reconstructions were also generated. COMPARISON:  Radiographs dated 10/20/2017 FINDINGS: Bones/Joint/Cartilage There is no acute fracture or dislocation. There is mild to moderate osteoarthritis of the left hip with marginal osteophyte formation and subcortical cyst formation in the acetabulum and femoral head. The area of concern on radiographs is normal on the CT scan. There are chronic soft tissue  calcifications adjacent to the greater and lesser trochanters. Arterial vascular calcification around the hip. Chronic calcific tendinopathy of the origin of the hamstring tendons. No significant acute soft tissue abnormality. IMPRESSION: 1. No acute abnormality of the bones of the left hip. 2. Arthritic changes and other degenerative changes around the left hip as described. Electronically Signed   By: Lorriane Shire M.D.   On: 10/20/2017 11:50   Dg Hip Unilat With Pelvis 2-3 Views Left  Result Date: 10/20/2017 CLINICAL DATA:  Left flank/hip pain after a fall yesterday. Pt states she tripped over curb. EXAM: DG HIP (WITH OR WITHOUT PELVIS) 2-3V LEFT COMPARISON:  05/22/2014 FINDINGS: There is a linear lucency in the trochanteric region on the external rotation view. This raises question of minimally displaced intertrochanteric fracture. No evidence for dislocation. There is extensive atherosclerotic calcification, showing significant change since 2015. Otherwise the pelvis appears intact. IMPRESSION: Question of left intertrochanteric fracture. CT is recommended for further evaluation. Electronically Signed   By: Nolon Nations M.D.   On:  10/20/2017 09:28    Procedures Procedures (including critical care time)  Medications Ordered in ED Medications  oxyCODONE-acetaminophen (PERCOCET/ROXICET) 5-325 MG per tablet 1 tablet (1 tablet Oral Not Given 10/20/17 1153)  carvedilol (COREG) tablet 25 mg (25 mg Oral Given 10/20/17 1247)  amLODipine (NORVASC) tablet 10 mg (10 mg Oral Given 10/20/17 1247)  acetaminophen (TYLENOL) tablet 650 mg (650 mg Oral Given 10/20/17 1215)     Initial Impression / Assessment and Plan / ED Course  I have reviewed the triage vital signs and the nursing notes.  Pertinent labs & imaging results that were available during my care of the patient were reviewed by me and considered in my medical decision making (see chart for details).     Patient with fall.  Left hip pain with tenderness more in the iliac area.  Nonspecific x-ray of the hip.  CT scan done and reassuring.  Likely no fracture.  Will discharge home.  Patient's takes Tylenol works well for the pain.  Final Clinical Impressions(s) / ED Diagnoses   Final diagnoses:  Fall, initial encounter  Contusion of left hip, initial encounter    ED Discharge Orders    None       Davonna Belling, MD 10/20/17 1551

## 2017-10-20 NOTE — ED Notes (Signed)
Pt denied w/c and ambulated to restroom.

## 2017-10-20 NOTE — ED Notes (Signed)
Patient transported to bathroom. Patient refused wheelchair and requesting to walk

## 2017-10-20 NOTE — ED Triage Notes (Addendum)
Pt states she has left flank/hip pain after a fall yesterday. Pt states she tripped over a curb. She is a MWF dialysis pt. Pt last dialyzed Wednesday.

## 2017-10-22 DIAGNOSIS — N186 End stage renal disease: Secondary | ICD-10-CM | POA: Diagnosis not present

## 2017-10-22 DIAGNOSIS — N2581 Secondary hyperparathyroidism of renal origin: Secondary | ICD-10-CM | POA: Diagnosis not present

## 2017-10-22 DIAGNOSIS — D631 Anemia in chronic kidney disease: Secondary | ICD-10-CM | POA: Diagnosis not present

## 2017-10-22 DIAGNOSIS — D509 Iron deficiency anemia, unspecified: Secondary | ICD-10-CM | POA: Diagnosis not present

## 2017-10-22 DIAGNOSIS — D689 Coagulation defect, unspecified: Secondary | ICD-10-CM | POA: Diagnosis not present

## 2017-10-26 ENCOUNTER — Emergency Department (HOSPITAL_COMMUNITY)
Admission: EM | Admit: 2017-10-26 | Discharge: 2017-10-26 | Disposition: A | Discharge status: Home / Self Care | Payer: Medicare HMO | Attending: Emergency Medicine | Admitting: Emergency Medicine

## 2017-10-26 ENCOUNTER — Encounter (HOSPITAL_COMMUNITY): Payer: Self-pay

## 2017-10-26 ENCOUNTER — Other Ambulatory Visit: Payer: Self-pay

## 2017-10-26 DIAGNOSIS — R1012 Left upper quadrant pain: Secondary | ICD-10-CM | POA: Diagnosis present

## 2017-10-26 DIAGNOSIS — Z79899 Other long term (current) drug therapy: Secondary | ICD-10-CM | POA: Insufficient documentation

## 2017-10-26 DIAGNOSIS — N186 End stage renal disease: Secondary | ICD-10-CM | POA: Insufficient documentation

## 2017-10-26 DIAGNOSIS — R410 Disorientation, unspecified: Secondary | ICD-10-CM | POA: Diagnosis not present

## 2017-10-26 DIAGNOSIS — R197 Diarrhea, unspecified: Secondary | ICD-10-CM | POA: Diagnosis not present

## 2017-10-26 DIAGNOSIS — M25552 Pain in left hip: Secondary | ICD-10-CM | POA: Diagnosis not present

## 2017-10-26 DIAGNOSIS — Z992 Dependence on renal dialysis: Secondary | ICD-10-CM | POA: Insufficient documentation

## 2017-10-26 DIAGNOSIS — I120 Hypertensive chronic kidney disease with stage 5 chronic kidney disease or end stage renal disease: Secondary | ICD-10-CM | POA: Diagnosis not present

## 2017-10-26 DIAGNOSIS — E119 Type 2 diabetes mellitus without complications: Secondary | ICD-10-CM | POA: Diagnosis not present

## 2017-10-26 LAB — I-STAT CHEM 8, ED
BUN: 86 mg/dL — AB (ref 6–20)
CREATININE: 9 mg/dL — AB (ref 0.44–1.00)
Calcium, Ion: 1 mmol/L — ABNORMAL LOW (ref 1.15–1.40)
Chloride: 96 mmol/L — ABNORMAL LOW (ref 101–111)
GLUCOSE: 85 mg/dL (ref 65–99)
HCT: 26 % — ABNORMAL LOW (ref 36.0–46.0)
HEMOGLOBIN: 8.8 g/dL — AB (ref 12.0–15.0)
POTASSIUM: 4.5 mmol/L (ref 3.5–5.1)
Sodium: 134 mmol/L — ABNORMAL LOW (ref 135–145)
TCO2: 24 mmol/L (ref 22–32)

## 2017-10-26 NOTE — ED Notes (Signed)
Dr. Jacubowitz at bedside 

## 2017-10-26 NOTE — ED Notes (Addendum)
Pt was told to completely undress for doctor to see, pt refused to take pants off. Nurse was notified.

## 2017-10-26 NOTE — Discharge Instructions (Signed)
Go to your regularly scheduled dialysis session tomorrow.  You can continue to take Imodium as directed for diarrhea.  Return to the emergency department if you develop shortness of breath or are concern for any reason

## 2017-10-26 NOTE — ED Triage Notes (Signed)
Per pt: Pt missed dialysis on Wednesday and is supposed to have dialysis again tomorrow. Pt is not having difficulty breathing or problems due to missed dialysis. Pt is having continued pain on her left side from her fall on the 21st. States pain is from her hip up to her waist. Pt states that she also diarrhea that started Wednesday and took immodium and it helped. Last episode of diarrhea was yesterday.

## 2017-10-26 NOTE — ED Provider Notes (Addendum)
[facility] EMERGENCY DEPARTMENT Provider Note   CSN: [id number] Arrival date & time: 10/26/17  0759     History   Chief Complaint Chief Complaint  Patient presents with  . Pain  . Diarrhea  . Vascular Access Problem    missed dialysis yesterday    HPI CAPRICIA SERDA is a 76 y.o. female.  Patient presents to the emergency department stating.  I need dialysis.  I missed dialysis yesterday.  Denies shortness of breath denies chest pain.  She complains of mild pain at left flank and left hip area as a result of a fall that she had 10/18/2017.  She was evaluated here.  She is treated herself with Tylenol with improvement of pain.  She also reports diarrhea over the past 3 or 4 days.  She has had no diarrhea today.  She had 3 or 4 episodes of diarrhea yesterday, nonbloody.  Treat herself with Imodium.  She denies any lightheadedness.  No other associated symptoms  HPI  Past Medical History:  Diagnosis Date  . DEPRESSION   . DIABETES MELLITUS, TYPE I   . GERD   . GLAUCOMA   . Headache(784.0)   . Hepatitis B carrier ([place])    05/2009: neg Hep C; Hep B: core pos, Surf neg; fatty liver US 8/10 - 7/13  . HYPERLIPIDEMIA   . HYPERTENSION   . Kidney failure    dialysis 3 times per week  . OSTEOPENIA   . POSTMENOPAUSAL STATUS   . Unspecified vitamin D deficiency     Patient Active Problem List   Diagnosis Date Noted  . Respiratory failure with hypoxia ([place]) 08/18/2017  . Volume overload 08/18/2017  . Dialysis patient, noncompliant ([place]) 08/17/2017  . Thumb pain, left 12/22/2016  . Pulmonary edema 11/25/2016  . Acute respiratory failure with hypoxia ([place]) 11/25/2016  . Chronic diastolic heart failure ([place]) 11/25/2016  . End stage renal disease ([place]) 06/25/2014  . CAP (community acquired pneumonia) 05/17/2014  . Malignant hypertension 05/17/2014  . Chest pain 05/17/2014  . Elevated troponin 05/17/2014  . Hypertensive emergency 05/17/2014  . NSTEMI (non-ST  elevated myocardial infarction) ([place]) 05/17/2014  . Chronic kidney disease, stage IV (severe) ([place]) 12/05/2013  . GERD 06/17/2009  . Hepatitis B carrier ([place]) 06/17/2009  . UNSPECIFIED VITAMIN D DEFICIENCY 05/21/2009  . DEPRESSION 05/20/2009  . GLAUCOMA 05/20/2009  . HEADACHE 05/20/2009  . CARDIAC MURMUR 05/20/2009  . OSTEOPENIA 02/15/2008  . POSTMENOPAUSAL STATUS 12/21/2007  . Hyperlipidemia LDL goal <70 09/21/2007  . Essential hypertension 09/13/2007  . Type II diabetes mellitus with nephropathy [facility])     Past Surgical History:  Procedure Laterality Date  . AV FISTULA PLACEMENT Left 05/29/2014   Procedure: INSERTION OF ARTERIOVENOUS (AV) GORE-TEX GRAFT ARM-LEFT;  Surgeon: Angelia Mould, MD;  Location: [facility];  Service: Vascular;  Laterality: Left;  . CHOLECYSTECTOMY  02/12/07  . INSERTION OF DIALYSIS CATHETER Right 05/27/2014   Procedure: INSERTION OF DIALYSIS CATHETER;  Surgeon: Angelia Mould, MD;  Location: [facility];  Service: Vascular;  Laterality: Right;  . REFRACTIVE SURGERY  07/29/09   Dr. Bing Plume  . TONSILLECTOMY      OB History    No data available       Home Medications    Prior to Admission medications   Medication Sig Start Date End Date Taking? Authorizing Provider  acetaminophen (TYLENOL) 325 MG tablet Take 650 mg by mouth every 6 (six) hours as needed for moderate pain.  [provider]  amLODipine (NORVASC) 10 MG tablet TAKE 1 TABLET BY MOUTH EVERY DAY Patient taking differently: TAKE 10mg  BY MOUTH EVERY DAY 12/30/15   Carollee Herter, Yvonne R, DO  carvedilol (COREG) 25 MG tablet TAKE 1 TABLET (25 MG TOTAL) BY MOUTH 2 (TWO) TIMES DAILY WITH A MEAL. 09/19/17   Roma Schanz R, DO  furosemide (LASIX) 40 MG tablet TAKE 1 TABLET BY MOUTH EVERY DAY Patient taking differently: TAKE 40mg  BY MOUTH EVERY DAY 09/19/17   Carollee Herter, Alferd Apa, DO  isosorbide mononitrate (IMDUR) 30 MG 24 hr tablet Take 1 tablet (30 mg total) by mouth 2 (two) times  daily. 07/07/17   Ann Held, DO  lisinopril (PRINIVIL,ZESTRIL) 40 MG tablet Take 1 tablet (40 mg total) by mouth at bedtime. 09/27/17   Ann Held, DO  Multiple Vitamin (MULTIVITAMIN WITH MINERALS) TABS tablet Take 2 tablets by mouth daily.    [provider]    Family History Family History  Problem Relation Age of Onset  . Coronary artery disease Other   . Diabetes Other   . Hypertension Other   . Arthritis Other     Social History Social History   Tobacco Use  . Smoking status: Never Smoker  . Smokeless tobacco: Never Used  . Tobacco comment: Married x's 77yrs, 6 kids-scattered [employer]. Retired Consulting civil engineer  Substance Use Topics  . Alcohol use: No  . Drug use: No     Allergies   Hydralazine and Robaxin [methocarbamol]   Review of Systems Review of Systems  Genitourinary: Positive for flank pain.       Left flank pain since fall  Allergic/Immunologic: Positive for immunocompromised state.       Dialysis patient  All other systems reviewed and are negative.    Physical Exam Updated Vital Signs BP (!) 158/57 (BP Location: Right Arm)   Pulse 72   Temp 98.6 F (37 C) (Oral)   Resp 17   SpO2 100%   Physical Exam  Constitutional: She appears well-developed and well-nourished.  HENT:  Head: Normocephalic and atraumatic.  Eyes: Conjunctivae are normal. Pupils are equal, round, and reactive to light.  Neck: Neck supple. No tracheal deviation present. No thyromegaly present.  Cardiovascular: Normal rate and regular rhythm.  No murmur heard. Pulmonary/Chest: Effort normal and breath sounds normal.  Abdominal: Soft. Bowel sounds are normal. She exhibits no distension. There is no tenderness.  Musculoskeletal: Normal range of motion. She exhibits no edema or tenderness.  Upper extremity with dialysis fistula with good thrill.  All 4 extremities without deformity or tenderness, neurovascular intact.  Neurological: She is alert.  Coordination normal.  Skin: Skin is warm and dry. No rash noted.  Psychiatric: She has a normal mood and affect.  Nursing note and vitals reviewed.    ED Treatments / Results  Labs (all labs ordered are listed, but only abnormal results are displayed) Labs Reviewed  I-STAT CHEM 8, ED    EKG  EKG Interpretation None       Radiology No results found.  Procedures Procedures (including critical care time)  Medications Ordered in ED Medications - No data to display  9:00 AM patient resting comfortably.  No distress.  She has dialysis scheduled for tomorrow.  No further intervention needed advised to return to the emergency department if she develops shortness of breath  Results for orders placed or performed during the hospital encounter of 10/26/17  I-stat chem 8, ed  Result  Value Ref Range   Sodium 134 (L) 135 - 145 mmol/L   Potassium 4.5 3.5 - 5.1 mmol/L   Chloride 96 (L) 101 - 111 mmol/L   BUN 86 (H) 6 - 20 mg/dL   Creatinine, Ser 9.00 (H) 0.44 - 1.00 mg/dL   Glucose, Bld 85 65 - 99 mg/dL   Calcium, Ion 1.00 (L) 1.15 - 1.40 mmol/L   TCO2 24 22 - 32 mmol/L   Hemoglobin 8.8 (L) 12.0 - 15.0 g/dL   HCT 26.0 (L) 36.0 - 46.0 %   Ct Hip Left Wo Contrast  Result Date: 10/20/2017 CLINICAL DATA:  Left hip pain secondary to a fall yesterday. EXAM: CT OF THE LEFT HIP WITHOUT CONTRAST TECHNIQUE: Multidetector CT imaging of the left hip was performed according to the standard protocol. Multiplanar CT image reconstructions were also generated. COMPARISON:  Radiographs dated 10/20/2017 FINDINGS: Bones/Joint/Cartilage There is no acute fracture or dislocation. There is mild to moderate osteoarthritis of the left hip with marginal osteophyte formation and subcortical cyst formation in the acetabulum and femoral head. The area of concern on radiographs is normal on the CT scan. There are chronic soft tissue calcifications adjacent to the greater and lesser trochanters. Arterial vascular  calcification around the hip. Chronic calcific tendinopathy of the origin of the hamstring tendons. No significant acute soft tissue abnormality. IMPRESSION: 1. No acute abnormality of the bones of the left hip. 2. Arthritic changes and other degenerative changes around the left hip as described. Electronically Signed   By: Lorriane Shire M.D.   On: 10/20/2017 11:50   Dg Hip Unilat With Pelvis 2-3 Views Left  Result Date: 10/20/2017 CLINICAL DATA:  Left flank/hip pain after a fall yesterday. Pt states she tripped over curb. EXAM: DG HIP (WITH OR WITHOUT PELVIS) 2-3V LEFT COMPARISON:  05/22/2014 FINDINGS: There is a linear lucency in the trochanteric region on the external rotation view. This raises question of minimally displaced intertrochanteric fracture. No evidence for dislocation. There is extensive atherosclerotic calcification, showing significant change since 2015. Otherwise the pelvis appears intact. IMPRESSION: Question of left intertrochanteric fracture. CT is recommended for further evaluation. Electronically Signed   By: Nolon Nations M.D.   On: 10/20/2017 09:28   Initial Impression / Assessment and Plan / ED Course  I have reviewed the triage vital signs and the nursing notes.  Pertinent labs & imaging results that were available during my care of the patient were reviewed by me and considered in my medical decision making (see chart for details).     No urgent or emergent need for dialysis  Final Clinical Impressions(s) / ED Diagnoses  Dx#1 end-stage renal disease #2 diarrhea #3 contusion Final diagnoses:  None    ED Discharge Orders    None       Orlie Dakin, MD 10/26/17 [id number]    Orlie Dakin, MD 10/26/17 1011

## 2017-10-27 DIAGNOSIS — N2581 Secondary hyperparathyroidism of renal origin: Secondary | ICD-10-CM | POA: Diagnosis not present

## 2017-10-27 DIAGNOSIS — D631 Anemia in chronic kidney disease: Secondary | ICD-10-CM | POA: Diagnosis not present

## 2017-10-27 DIAGNOSIS — D689 Coagulation defect, unspecified: Secondary | ICD-10-CM | POA: Diagnosis not present

## 2017-10-27 DIAGNOSIS — D509 Iron deficiency anemia, unspecified: Secondary | ICD-10-CM | POA: Diagnosis not present

## 2017-10-27 DIAGNOSIS — N186 End stage renal disease: Secondary | ICD-10-CM | POA: Diagnosis not present

## 2017-10-29 DIAGNOSIS — N186 End stage renal disease: Secondary | ICD-10-CM | POA: Diagnosis not present

## 2017-10-29 DIAGNOSIS — N2581 Secondary hyperparathyroidism of renal origin: Secondary | ICD-10-CM | POA: Diagnosis not present

## 2017-10-29 DIAGNOSIS — D509 Iron deficiency anemia, unspecified: Secondary | ICD-10-CM | POA: Diagnosis not present

## 2017-10-29 DIAGNOSIS — D631 Anemia in chronic kidney disease: Secondary | ICD-10-CM | POA: Diagnosis not present

## 2017-10-29 DIAGNOSIS — D689 Coagulation defect, unspecified: Secondary | ICD-10-CM | POA: Diagnosis not present

## 2017-10-30 DIAGNOSIS — N186 End stage renal disease: Secondary | ICD-10-CM | POA: Diagnosis not present

## 2017-10-30 DIAGNOSIS — Z992 Dependence on renal dialysis: Secondary | ICD-10-CM | POA: Diagnosis not present

## 2017-10-30 DIAGNOSIS — E1122 Type 2 diabetes mellitus with diabetic chronic kidney disease: Secondary | ICD-10-CM | POA: Diagnosis not present

## 2017-11-01 DIAGNOSIS — N2581 Secondary hyperparathyroidism of renal origin: Secondary | ICD-10-CM | POA: Diagnosis not present

## 2017-11-01 DIAGNOSIS — N186 End stage renal disease: Secondary | ICD-10-CM | POA: Diagnosis not present

## 2017-11-03 DIAGNOSIS — N2581 Secondary hyperparathyroidism of renal origin: Secondary | ICD-10-CM | POA: Diagnosis not present

## 2017-11-03 DIAGNOSIS — N186 End stage renal disease: Secondary | ICD-10-CM | POA: Diagnosis not present

## 2017-11-06 DIAGNOSIS — M898X9 Other specified disorders of bone, unspecified site: Secondary | ICD-10-CM | POA: Diagnosis present

## 2017-11-06 DIAGNOSIS — H409 Unspecified glaucoma: Secondary | ICD-10-CM | POA: Diagnosis present

## 2017-11-06 DIAGNOSIS — T8089XA Other complications following infusion, transfusion and therapeutic injection, initial encounter: Principal | ICD-10-CM | POA: Diagnosis present

## 2017-11-06 DIAGNOSIS — I132 Hypertensive heart and chronic kidney disease with heart failure and with stage 5 chronic kidney disease, or end stage renal disease: Secondary | ICD-10-CM | POA: Diagnosis present

## 2017-11-06 DIAGNOSIS — R112 Nausea with vomiting, unspecified: Secondary | ICD-10-CM | POA: Diagnosis present

## 2017-11-06 DIAGNOSIS — M8580 Other specified disorders of bone density and structure, unspecified site: Secondary | ICD-10-CM | POA: Diagnosis present

## 2017-11-06 DIAGNOSIS — I158 Other secondary hypertension: Secondary | ICD-10-CM | POA: Diagnosis present

## 2017-11-06 DIAGNOSIS — M545 Low back pain: Secondary | ICD-10-CM | POA: Diagnosis present

## 2017-11-06 DIAGNOSIS — I248 Other forms of acute ischemic heart disease: Secondary | ICD-10-CM | POA: Diagnosis present

## 2017-11-06 DIAGNOSIS — D631 Anemia in chronic kidney disease: Secondary | ICD-10-CM | POA: Diagnosis present

## 2017-11-06 DIAGNOSIS — D649 Anemia, unspecified: Secondary | ICD-10-CM | POA: Diagnosis not present

## 2017-11-06 DIAGNOSIS — I350 Nonrheumatic aortic (valve) stenosis: Secondary | ICD-10-CM | POA: Diagnosis present

## 2017-11-06 DIAGNOSIS — Y841 Kidney dialysis as the cause of abnormal reaction of the patient, or of later complication, without mention of misadventure at the time of the procedure: Secondary | ICD-10-CM | POA: Diagnosis present

## 2017-11-06 DIAGNOSIS — E1122 Type 2 diabetes mellitus with diabetic chronic kidney disease: Secondary | ICD-10-CM | POA: Diagnosis present

## 2017-11-06 DIAGNOSIS — Y92538 Other ambulatory health services establishments as the place of occurrence of the external cause: Secondary | ICD-10-CM | POA: Diagnosis present

## 2017-11-06 DIAGNOSIS — J9601 Acute respiratory failure with hypoxia: Secondary | ICD-10-CM | POA: Diagnosis present

## 2017-11-06 DIAGNOSIS — I120 Hypertensive chronic kidney disease with stage 5 chronic kidney disease or end stage renal disease: Secondary | ICD-10-CM | POA: Diagnosis not present

## 2017-11-06 DIAGNOSIS — Z992 Dependence on renal dialysis: Secondary | ICD-10-CM

## 2017-11-06 DIAGNOSIS — R069 Unspecified abnormalities of breathing: Secondary | ICD-10-CM | POA: Diagnosis not present

## 2017-11-06 DIAGNOSIS — E1121 Type 2 diabetes mellitus with diabetic nephropathy: Secondary | ICD-10-CM | POA: Diagnosis present

## 2017-11-06 DIAGNOSIS — J810 Acute pulmonary edema: Secondary | ICD-10-CM | POA: Diagnosis present

## 2017-11-06 DIAGNOSIS — M25552 Pain in left hip: Secondary | ICD-10-CM | POA: Diagnosis present

## 2017-11-06 DIAGNOSIS — I700 Atherosclerosis of aorta: Secondary | ICD-10-CM | POA: Diagnosis present

## 2017-11-06 DIAGNOSIS — W19XXXA Unspecified fall, initial encounter: Secondary | ICD-10-CM | POA: Diagnosis present

## 2017-11-06 DIAGNOSIS — D638 Anemia in other chronic diseases classified elsewhere: Secondary | ICD-10-CM | POA: Diagnosis not present

## 2017-11-06 DIAGNOSIS — R0602 Shortness of breath: Secondary | ICD-10-CM | POA: Diagnosis not present

## 2017-11-06 DIAGNOSIS — K760 Fatty (change of) liver, not elsewhere classified: Secondary | ICD-10-CM | POA: Diagnosis present

## 2017-11-06 DIAGNOSIS — I160 Hypertensive urgency: Secondary | ICD-10-CM | POA: Diagnosis present

## 2017-11-06 DIAGNOSIS — K7460 Unspecified cirrhosis of liver: Secondary | ICD-10-CM | POA: Diagnosis present

## 2017-11-06 DIAGNOSIS — K5900 Constipation, unspecified: Secondary | ICD-10-CM | POA: Diagnosis present

## 2017-11-06 DIAGNOSIS — I361 Nonrheumatic tricuspid (valve) insufficiency: Secondary | ICD-10-CM | POA: Diagnosis not present

## 2017-11-06 DIAGNOSIS — D696 Thrombocytopenia, unspecified: Secondary | ICD-10-CM | POA: Diagnosis present

## 2017-11-06 DIAGNOSIS — R9431 Abnormal electrocardiogram [ECG] [EKG]: Secondary | ICD-10-CM | POA: Diagnosis not present

## 2017-11-06 DIAGNOSIS — I5032 Chronic diastolic (congestive) heart failure: Secondary | ICD-10-CM | POA: Diagnosis present

## 2017-11-06 DIAGNOSIS — Z8719 Personal history of other diseases of the digestive system: Secondary | ICD-10-CM

## 2017-11-06 DIAGNOSIS — N186 End stage renal disease: Secondary | ICD-10-CM | POA: Diagnosis present

## 2017-11-06 DIAGNOSIS — R197 Diarrhea, unspecified: Secondary | ICD-10-CM | POA: Diagnosis present

## 2017-11-06 DIAGNOSIS — E8770 Fluid overload, unspecified: Secondary | ICD-10-CM | POA: Diagnosis present

## 2017-11-06 DIAGNOSIS — R195 Other fecal abnormalities: Secondary | ICD-10-CM | POA: Diagnosis not present

## 2017-11-06 DIAGNOSIS — N2581 Secondary hyperparathyroidism of renal origin: Secondary | ICD-10-CM | POA: Diagnosis present

## 2017-11-06 DIAGNOSIS — Z79899 Other long term (current) drug therapy: Secondary | ICD-10-CM

## 2017-11-06 DIAGNOSIS — R748 Abnormal levels of other serum enzymes: Secondary | ICD-10-CM | POA: Diagnosis not present

## 2017-11-06 DIAGNOSIS — B181 Chronic viral hepatitis B without delta-agent: Secondary | ICD-10-CM | POA: Diagnosis present

## 2017-11-06 DIAGNOSIS — R05 Cough: Secondary | ICD-10-CM | POA: Diagnosis not present

## 2017-11-06 DIAGNOSIS — Z888 Allergy status to other drugs, medicaments and biological substances status: Secondary | ICD-10-CM

## 2017-11-06 DIAGNOSIS — Z780 Asymptomatic menopausal state: Secondary | ICD-10-CM

## 2017-11-06 DIAGNOSIS — R079 Chest pain, unspecified: Secondary | ICD-10-CM | POA: Diagnosis not present

## 2017-11-07 ENCOUNTER — Inpatient Hospital Stay (HOSPITAL_COMMUNITY): Payer: Medicare HMO

## 2017-11-07 ENCOUNTER — Encounter (HOSPITAL_COMMUNITY): Payer: Self-pay | Admitting: Emergency Medicine

## 2017-11-07 ENCOUNTER — Other Ambulatory Visit (HOSPITAL_COMMUNITY): Payer: Medicare HMO

## 2017-11-07 ENCOUNTER — Inpatient Hospital Stay (HOSPITAL_COMMUNITY)
Admission: EM | Admit: 2017-11-07 | Discharge: 2017-11-11 | DRG: 811 | Disposition: A | Discharge status: Home / Self Care | Payer: Medicare HMO | Attending: Internal Medicine | Admitting: Internal Medicine

## 2017-11-07 ENCOUNTER — Emergency Department (HOSPITAL_COMMUNITY): Payer: Medicare HMO

## 2017-11-07 DIAGNOSIS — W19XXXA Unspecified fall, initial encounter: Secondary | ICD-10-CM | POA: Diagnosis present

## 2017-11-07 DIAGNOSIS — I160 Hypertensive urgency: Secondary | ICD-10-CM | POA: Diagnosis present

## 2017-11-07 DIAGNOSIS — H409 Unspecified glaucoma: Secondary | ICD-10-CM | POA: Diagnosis present

## 2017-11-07 DIAGNOSIS — R112 Nausea with vomiting, unspecified: Secondary | ICD-10-CM | POA: Diagnosis present

## 2017-11-07 DIAGNOSIS — N2581 Secondary hyperparathyroidism of renal origin: Secondary | ICD-10-CM | POA: Diagnosis present

## 2017-11-07 DIAGNOSIS — R195 Other fecal abnormalities: Secondary | ICD-10-CM | POA: Diagnosis not present

## 2017-11-07 DIAGNOSIS — R079 Chest pain, unspecified: Secondary | ICD-10-CM | POA: Diagnosis not present

## 2017-11-07 DIAGNOSIS — I700 Atherosclerosis of aorta: Secondary | ICD-10-CM | POA: Diagnosis present

## 2017-11-07 DIAGNOSIS — J9690 Respiratory failure, unspecified, unspecified whether with hypoxia or hypercapnia: Secondary | ICD-10-CM | POA: Diagnosis present

## 2017-11-07 DIAGNOSIS — B181 Chronic viral hepatitis B without delta-agent: Secondary | ICD-10-CM | POA: Diagnosis present

## 2017-11-07 DIAGNOSIS — M545 Low back pain, unspecified: Secondary | ICD-10-CM

## 2017-11-07 DIAGNOSIS — I158 Other secondary hypertension: Secondary | ICD-10-CM | POA: Diagnosis present

## 2017-11-07 DIAGNOSIS — K7460 Unspecified cirrhosis of liver: Secondary | ICD-10-CM

## 2017-11-07 DIAGNOSIS — Y92538 Other ambulatory health services establishments as the place of occurrence of the external cause: Secondary | ICD-10-CM | POA: Diagnosis present

## 2017-11-07 DIAGNOSIS — D631 Anemia in chronic kidney disease: Secondary | ICD-10-CM | POA: Diagnosis present

## 2017-11-07 DIAGNOSIS — I248 Other forms of acute ischemic heart disease: Secondary | ICD-10-CM | POA: Diagnosis present

## 2017-11-07 DIAGNOSIS — Y841 Kidney dialysis as the cause of abnormal reaction of the patient, or of later complication, without mention of misadventure at the time of the procedure: Secondary | ICD-10-CM | POA: Diagnosis present

## 2017-11-07 DIAGNOSIS — M898X9 Other specified disorders of bone, unspecified site: Secondary | ICD-10-CM | POA: Diagnosis present

## 2017-11-07 DIAGNOSIS — J810 Acute pulmonary edema: Secondary | ICD-10-CM

## 2017-11-07 DIAGNOSIS — N186 End stage renal disease: Secondary | ICD-10-CM | POA: Diagnosis present

## 2017-11-07 DIAGNOSIS — D638 Anemia in other chronic diseases classified elsewhere: Secondary | ICD-10-CM | POA: Diagnosis not present

## 2017-11-07 DIAGNOSIS — E1122 Type 2 diabetes mellitus with diabetic chronic kidney disease: Secondary | ICD-10-CM | POA: Diagnosis present

## 2017-11-07 DIAGNOSIS — D649 Anemia, unspecified: Secondary | ICD-10-CM | POA: Diagnosis not present

## 2017-11-07 DIAGNOSIS — I10 Essential (primary) hypertension: Secondary | ICD-10-CM | POA: Diagnosis present

## 2017-11-07 DIAGNOSIS — R197 Diarrhea, unspecified: Secondary | ICD-10-CM | POA: Diagnosis present

## 2017-11-07 DIAGNOSIS — Z992 Dependence on renal dialysis: Secondary | ICD-10-CM

## 2017-11-07 DIAGNOSIS — J811 Chronic pulmonary edema: Secondary | ICD-10-CM | POA: Diagnosis present

## 2017-11-07 DIAGNOSIS — R9431 Abnormal electrocardiogram [ECG] [EKG]: Secondary | ICD-10-CM

## 2017-11-07 DIAGNOSIS — I132 Hypertensive heart and chronic kidney disease with heart failure and with stage 5 chronic kidney disease, or end stage renal disease: Secondary | ICD-10-CM | POA: Diagnosis present

## 2017-11-07 DIAGNOSIS — R748 Abnormal levels of other serum enzymes: Secondary | ICD-10-CM | POA: Diagnosis not present

## 2017-11-07 DIAGNOSIS — K760 Fatty (change of) liver, not elsewhere classified: Secondary | ICD-10-CM | POA: Diagnosis present

## 2017-11-07 DIAGNOSIS — D696 Thrombocytopenia, unspecified: Secondary | ICD-10-CM | POA: Diagnosis present

## 2017-11-07 DIAGNOSIS — M8580 Other specified disorders of bone density and structure, unspecified site: Secondary | ICD-10-CM | POA: Diagnosis present

## 2017-11-07 DIAGNOSIS — E8770 Fluid overload, unspecified: Secondary | ICD-10-CM | POA: Diagnosis present

## 2017-11-07 DIAGNOSIS — E1121 Type 2 diabetes mellitus with diabetic nephropathy: Secondary | ICD-10-CM | POA: Diagnosis present

## 2017-11-07 DIAGNOSIS — J9601 Acute respiratory failure with hypoxia: Secondary | ICD-10-CM | POA: Diagnosis present

## 2017-11-07 DIAGNOSIS — T8089XA Other complications following infusion, transfusion and therapeutic injection, initial encounter: Secondary | ICD-10-CM | POA: Diagnosis present

## 2017-11-07 DIAGNOSIS — I361 Nonrheumatic tricuspid (valve) insufficiency: Secondary | ICD-10-CM | POA: Diagnosis not present

## 2017-11-07 DIAGNOSIS — I350 Nonrheumatic aortic (valve) stenosis: Secondary | ICD-10-CM | POA: Diagnosis present

## 2017-11-07 DIAGNOSIS — I5032 Chronic diastolic (congestive) heart failure: Secondary | ICD-10-CM | POA: Diagnosis present

## 2017-11-07 HISTORY — DX: Chronic pulmonary edema: J81.1

## 2017-11-07 LAB — BRAIN NATRIURETIC PEPTIDE: B NATRIURETIC PEPTIDE 5: 1397.8 pg/mL — AB (ref 0.0–100.0)

## 2017-11-07 LAB — BASIC METABOLIC PANEL
Anion gap: 17 — ABNORMAL HIGH (ref 5–15)
BUN: 21 mg/dL — AB (ref 6–20)
CO2: 23 mmol/L (ref 22–32)
CREATININE: 3.94 mg/dL — AB (ref 0.44–1.00)
Calcium: 8.1 mg/dL — ABNORMAL LOW (ref 8.9–10.3)
Chloride: 94 mmol/L — ABNORMAL LOW (ref 101–111)
GFR, EST AFRICAN AMERICAN: 12 mL/min — AB (ref >60)
GFR, EST NON AFRICAN AMERICAN: 10 mL/min — AB (ref >60)
Glucose, Bld: 89 mg/dL (ref 65–99)
Potassium: 3.5 mmol/L (ref 3.5–5.1)
Sodium: 134 mmol/L — ABNORMAL LOW (ref 135–145)

## 2017-11-07 LAB — URINALYSIS, ROUTINE W REFLEX MICROSCOPIC
BACTERIA UA: NONE SEEN
BILIRUBIN URINE: NEGATIVE
Glucose, UA: 50 mg/dL — AB
HGB URINE DIPSTICK: NEGATIVE
Ketones, ur: NEGATIVE mg/dL
Nitrite: NEGATIVE
PROTEIN: 100 mg/dL — AB
Specific Gravity, Urine: 1.012 (ref 1.005–1.030)
pH: 8 (ref 5.0–8.0)

## 2017-11-07 LAB — CBC WITH DIFFERENTIAL/PLATELET
BASOS ABS: 0 10*3/uL (ref 0.0–0.1)
Basophils Relative: 0 %
EOS ABS: 0.1 10*3/uL (ref 0.0–0.7)
Eosinophils Relative: 2 %
HCT: 26.4 % — ABNORMAL LOW (ref 36.0–46.0)
Hemoglobin: 8 g/dL — ABNORMAL LOW (ref 12.0–15.0)
Lymphocytes Relative: 10 %
Lymphs Abs: 0.7 10*3/uL (ref 0.7–4.0)
MCH: 29.1 pg (ref 26.0–34.0)
MCHC: 30.3 g/dL (ref 30.0–36.0)
MCV: 96 fL (ref 78.0–100.0)
MONO ABS: 0.3 10*3/uL (ref 0.1–1.0)
Monocytes Relative: 4 %
Neutro Abs: 6 10*3/uL (ref 1.7–7.7)
Neutrophils Relative %: 84 %
PLATELETS: 175 10*3/uL (ref 150–400)
RBC: 2.75 MIL/uL — AB (ref 3.87–5.11)
RDW: 21.7 % — AB (ref 11.5–15.5)
WBC: 7.1 10*3/uL (ref 4.0–10.5)

## 2017-11-07 LAB — CBC
HCT: 26.8 % — ABNORMAL LOW (ref 36.0–46.0)
Hemoglobin: 8.2 g/dL — ABNORMAL LOW (ref 12.0–15.0)
MCH: 29.8 pg (ref 26.0–34.0)
MCHC: 30.6 g/dL (ref 30.0–36.0)
MCV: 97.5 fL (ref 78.0–100.0)
PLATELETS: 168 10*3/uL (ref 150–400)
RBC: 2.75 MIL/uL — ABNORMAL LOW (ref 3.87–5.11)
RDW: 22.2 % — AB (ref 11.5–15.5)
WBC: 6 10*3/uL (ref 4.0–10.5)

## 2017-11-07 LAB — COMPREHENSIVE METABOLIC PANEL
ALT: 24 U/L (ref 14–54)
AST: 43 U/L — AB (ref 15–41)
Albumin: 2.9 g/dL — ABNORMAL LOW (ref 3.5–5.0)
Alkaline Phosphatase: 120 U/L (ref 38–126)
Anion gap: 12 (ref 5–15)
BUN: 18 mg/dL (ref 6–20)
CHLORIDE: 92 mmol/L — AB (ref 101–111)
CO2: 28 mmol/L (ref 22–32)
Calcium: 8.2 mg/dL — ABNORMAL LOW (ref 8.9–10.3)
Creatinine, Ser: 3.69 mg/dL — ABNORMAL HIGH (ref 0.44–1.00)
GFR calc Af Amer: 13 mL/min — ABNORMAL LOW (ref >60)
GFR, EST NON AFRICAN AMERICAN: 11 mL/min — AB (ref >60)
Glucose, Bld: 155 mg/dL — ABNORMAL HIGH (ref 65–99)
POTASSIUM: 3.5 mmol/L (ref 3.5–5.1)
SODIUM: 132 mmol/L — AB (ref 135–145)
Total Bilirubin: 1 mg/dL (ref 0.3–1.2)
Total Protein: 6.9 g/dL (ref 6.5–8.1)

## 2017-11-07 LAB — GLUCOSE, CAPILLARY
GLUCOSE-CAPILLARY: 95 mg/dL (ref 65–99)
Glucose-Capillary: 145 mg/dL — ABNORMAL HIGH (ref 65–99)

## 2017-11-07 LAB — I-STAT TROPONIN, ED: TROPONIN I, POC: 0.32 ng/mL — AB (ref 0.00–0.08)

## 2017-11-07 LAB — I-STAT ARTERIAL BLOOD GAS, ED
ACID-BASE EXCESS: 7 mmol/L — AB (ref 0.0–2.0)
Bicarbonate: 31.2 mmol/L — ABNORMAL HIGH (ref 20.0–28.0)
O2 Saturation: 98 %
PCO2 ART: 42 mmHg (ref 32.0–48.0)
PH ART: 7.478 — AB (ref 7.350–7.450)
Patient temperature: 98
TCO2: 33 mmol/L — ABNORMAL HIGH (ref 22–32)
pO2, Arterial: 101 mmHg (ref 83.0–108.0)

## 2017-11-07 LAB — TROPONIN I
TROPONIN I: 0.38 ng/mL — AB (ref <0.03)
TROPONIN I: 0.43 ng/mL — AB (ref <0.03)

## 2017-11-07 LAB — CBG MONITORING, ED: Glucose-Capillary: 91 mg/dL (ref 65–99)

## 2017-11-07 LAB — TYPE AND SCREEN
ABO/RH(D): B POS
ANTIBODY SCREEN: NEGATIVE

## 2017-11-07 LAB — HEPATITIS B SURFACE ANTIGEN: HEP B S AG: NEGATIVE

## 2017-11-07 LAB — AMMONIA: AMMONIA: 29 umol/L (ref 9–35)

## 2017-11-07 MED ORDER — LISINOPRIL 40 MG PO TABS
40.0000 mg | ORAL_TABLET | Freq: Every day | ORAL | Status: DC
  Administered 2017-11-07 – 2017-11-10 (×3): 40 mg via ORAL
  Filled 2017-11-07 (×4): qty 1

## 2017-11-07 MED ORDER — PENTAFLUOROPROP-TETRAFLUOROETH EX AERO: 1.0000 "application " | INHALATION_SPRAY | CUTANEOUS | Status: DC | PRN

## 2017-11-07 MED ORDER — FENTANYL CITRATE (PF) 100 MCG/2ML IJ SOLN
25.0000 ug | Freq: Once | INTRAMUSCULAR | Status: AC
  Administered 2017-11-07: 25 ug via INTRAVENOUS

## 2017-11-07 MED ORDER — SODIUM CHLORIDE 0.9 % IV SOLN: 100.0000 mL | INTRAVENOUS | Status: DC | PRN

## 2017-11-07 MED ORDER — LIDOCAINE-PRILOCAINE 2.5-2.5 % EX CREA: 1.0000 "application " | TOPICAL_CREAM | CUTANEOUS | Status: DC | PRN

## 2017-11-07 MED ORDER — LABETALOL HCL 5 MG/ML IV SOLN
5.0000 mg | INTRAVENOUS | Status: DC | PRN
  Filled 2017-11-07: qty 4

## 2017-11-07 MED ORDER — ADULT MULTIVITAMIN W/MINERALS CH
1.0000 | ORAL_TABLET | Freq: Every day | ORAL | Status: DC
  Administered 2017-11-08 – 2017-11-11 (×3): 1 via ORAL
  Filled 2017-11-07 (×3): qty 1

## 2017-11-07 MED ORDER — FUROSEMIDE 40 MG PO TABS
40.0000 mg | ORAL_TABLET | Freq: Every day | ORAL | Status: DC
  Administered 2017-11-08 – 2017-11-11 (×4): 40 mg via ORAL
  Filled 2017-11-07 (×4): qty 1

## 2017-11-07 MED ORDER — FERROUS SULFATE 325 (65 FE) MG PO TABS
325.0000 mg | ORAL_TABLET | Freq: Every day | ORAL | Status: DC
  Administered 2017-11-08 – 2017-11-11 (×3): 325 mg via ORAL
  Filled 2017-11-07 (×3): qty 1
  Filled 2017-11-07: qty 2

## 2017-11-07 MED ORDER — LIDOCAINE HCL (PF) 1 % IJ SOLN: 5.0000 mL | INTRAMUSCULAR | Status: DC | PRN

## 2017-11-07 MED ORDER — ONDANSETRON HCL 4 MG/2ML IJ SOLN: 4.0000 mg | Freq: Four times a day (QID) | INTRAMUSCULAR | Status: DC | PRN

## 2017-11-07 MED ORDER — ONDANSETRON HCL 4 MG PO TABS: 4.0000 mg | ORAL_TABLET | Freq: Four times a day (QID) | ORAL | Status: DC | PRN

## 2017-11-07 MED ORDER — HEPARIN SODIUM (PORCINE) 1000 UNIT/ML DIALYSIS: 1000.0000 [IU] | INTRAMUSCULAR | Status: DC | PRN

## 2017-11-07 MED ORDER — ISOSORBIDE MONONITRATE ER 30 MG PO TB24
30.0000 mg | ORAL_TABLET | Freq: Two times a day (BID) | ORAL | Status: DC
  Administered 2017-11-07 – 2017-11-11 (×7): 30 mg via ORAL
  Filled 2017-11-07 (×7): qty 1

## 2017-11-07 MED ORDER — FENTANYL CITRATE (PF) 100 MCG/2ML IJ SOLN
INTRAMUSCULAR | Status: AC
  Filled 2017-11-07: qty 2

## 2017-11-07 MED ORDER — HEPARIN SODIUM (PORCINE) 5000 UNIT/ML IJ SOLN
5000.0000 [IU] | Freq: Three times a day (TID) | INTRAMUSCULAR | Status: DC
  Administered 2017-11-07 – 2017-11-08 (×2): 5000 [IU] via SUBCUTANEOUS
  Filled 2017-11-07 (×4): qty 1

## 2017-11-07 MED ORDER — AMLODIPINE BESYLATE 10 MG PO TABS
10.0000 mg | ORAL_TABLET | Freq: Every day | ORAL | Status: DC
  Administered 2017-11-08 – 2017-11-11 (×4): 10 mg via ORAL
  Filled 2017-11-07 (×4): qty 1

## 2017-11-07 MED ORDER — FUROSEMIDE 10 MG/ML IJ SOLN
40.0000 mg | Freq: Once | INTRAMUSCULAR | Status: AC
  Administered 2017-11-07: 40 mg via INTRAVENOUS
  Filled 2017-11-07: qty 4

## 2017-11-07 MED ORDER — SUCROFERRIC OXYHYDROXIDE 500 MG PO CHEW
500.0000 mg | CHEWABLE_TABLET | Freq: Three times a day (TID) | ORAL | Status: DC
  Administered 2017-11-08 – 2017-11-10 (×7): 500 mg via ORAL
  Filled 2017-11-07 (×11): qty 1

## 2017-11-07 MED ORDER — ALTEPLASE 2 MG IJ SOLR: 2.0000 mg | Freq: Once | INTRAMUSCULAR | Status: DC | PRN

## 2017-11-07 MED ORDER — HEPARIN SODIUM (PORCINE) 1000 UNIT/ML DIALYSIS
20.0000 [IU]/kg | INTRAMUSCULAR | Status: DC | PRN
Start: 2017-11-07 — End: 2017-11-07

## 2017-11-07 MED ORDER — ASPIRIN 325 MG PO TABS
325.0000 mg | ORAL_TABLET | Freq: Every day | ORAL | Status: DC
  Administered 2017-11-08 – 2017-11-11 (×3): 325 mg via ORAL
  Filled 2017-11-07 (×3): qty 1

## 2017-11-07 MED ORDER — PRO-STAT SUGAR FREE PO LIQD
30.0000 mL | Freq: Two times a day (BID) | ORAL | Status: DC
  Administered 2017-11-07 – 2017-11-11 (×6): 30 mL via ORAL
  Filled 2017-11-07 (×6): qty 30

## 2017-11-07 MED ORDER — CALCITRIOL 0.5 MCG PO CAPS
1.5000 ug | ORAL_CAPSULE | ORAL | Status: DC
  Administered 2017-11-08: 1.5 ug via ORAL
  Filled 2017-11-07: qty 3

## 2017-11-07 MED ORDER — NITROGLYCERIN 2 % TD OINT
1.0000 [in_us] | TOPICAL_OINTMENT | Freq: Once | TRANSDERMAL | Status: AC
  Administered 2017-11-07: 1 [in_us] via TOPICAL
  Filled 2017-11-07: qty 1

## 2017-11-07 MED ORDER — ACETAMINOPHEN 325 MG PO TABS
650.0000 mg | ORAL_TABLET | Freq: Four times a day (QID) | ORAL | Status: DC | PRN
  Administered 2017-11-07 – 2017-11-09 (×3): 650 mg via ORAL
  Filled 2017-11-07 (×4): qty 2

## 2017-11-07 MED ORDER — CARVEDILOL 25 MG PO TABS
25.0000 mg | ORAL_TABLET | Freq: Two times a day (BID) | ORAL | Status: DC
  Administered 2017-11-08 – 2017-11-11 (×6): 25 mg via ORAL
  Filled 2017-11-07 (×6): qty 1

## 2017-11-07 MED ORDER — INSULIN ASPART 100 UNIT/ML ~~LOC~~ SOLN
0.0000 [IU] | Freq: Three times a day (TID) | SUBCUTANEOUS | Status: DC
  Administered 2017-11-08: 2 [IU] via SUBCUTANEOUS

## 2017-11-07 MED ORDER — ACETAMINOPHEN 650 MG RE SUPP: 650.0000 mg | Freq: Four times a day (QID) | RECTAL | Status: DC | PRN

## 2017-11-07 NOTE — Progress Notes (Signed)
Patient transported from ED to dialysis, on bipap, without complications.   

## 2017-11-07 NOTE — Progress Notes (Signed)
Troponin 0.43. Patient denies chest pain 0/10. Paged K.Kirby on call and informed her.  Awaiting on possible orders.  Will continue to monitor.  Mikka Kissner, RN

## 2017-11-07 NOTE — ED Provider Notes (Signed)
Case d/w Dr. Kenton Kingfisher of cardiology.  The troponin is likely secondary to to pulmonary edema and elevated BP.  Recommends trending troponins and dialyzing patient.    Veronica Guerrant, MD 11/07/17 [phone]

## 2017-11-07 NOTE — ED Provider Notes (Signed)
[facility] EMERGENCY DEPARTMENT Provider Note   CSN: [id number] Arrival date & time: 11/06/17  2356     History   Chief Complaint Chief Complaint  Patient presents with  . Shortness of Breath    HPI [name] [name] is a 77 y.o. female.  The history is provided by the patient, the spouse and a relative. The history is limited by the condition of the patient.  Shortness of Breath  This is a recurrent problem. The average episode lasts 1 day. The problem occurs continuously.The current episode started yesterday. The problem has been rapidly worsening. Associated symptoms include rhinorrhea, chest pain and vomiting. Pertinent negatives include no fever, no wheezing, no PND, no orthopnea, no abdominal pain, no rash, no leg pain and no leg swelling. Associated symptoms comments: diarrhea. The problem's precipitants include medical treatment. Risk factors: dialysis had a full treatment but was short of breath pre and post. She has tried nothing for the symptoms. The treatment provided no relief. She has had prior hospitalizations. She has had prior ED visits. She has had no prior ICU admissions. Associated medical issues do not include pneumonia.    Past Medical History:  Diagnosis Date  . DEPRESSION   . DIABETES MELLITUS, TYPE I   . GERD   . GLAUCOMA   . Headache(784.0)   . Hepatitis B carrier ([place])    05/2009: neg Hep C; Hep B: core pos, Surf neg; fatty liver US 8/10 - 7/13  . HYPERLIPIDEMIA   . HYPERTENSION   . Kidney failure    dialysis 3 times per week  . OSTEOPENIA   . POSTMENOPAUSAL STATUS   . Unspecified vitamin D deficiency     Patient Active Problem List   Diagnosis Date Noted  . Respiratory failure with hypoxia ([place]) 08/18/2017  . Volume overload 08/18/2017  . Dialysis patient, noncompliant ([place]) 08/17/2017  . Thumb pain, left 12/22/2016  . Pulmonary edema 11/25/2016  . Acute respiratory failure with hypoxia ([place]) 11/25/2016  . Chronic diastolic  heart failure ([place]) 11/25/2016  . End stage renal disease ([place]) 06/25/2014  . CAP (community acquired pneumonia) 05/17/2014  . Malignant hypertension 05/17/2014  . Chest pain 05/17/2014  . Elevated troponin 05/17/2014  . Hypertensive emergency 05/17/2014  . NSTEMI (non-ST elevated myocardial infarction) ([place]) 05/17/2014  . Chronic kidney disease, stage IV (severe) ([place]) 12/05/2013  . GERD 06/17/2009  . Hepatitis B carrier ([place]) 06/17/2009  . UNSPECIFIED VITAMIN D DEFICIENCY 05/21/2009  . DEPRESSION 05/20/2009  . GLAUCOMA 05/20/2009  . HEADACHE 05/20/2009  . CARDIAC MURMUR 05/20/2009  . OSTEOPENIA 02/15/2008  . POSTMENOPAUSAL STATUS 12/21/2007  . Hyperlipidemia LDL goal <70 09/21/2007  . Essential hypertension 09/13/2007  . Type II diabetes mellitus with nephropathy [facility])     Past Surgical History:  Procedure Laterality Date  . AV FISTULA PLACEMENT Left 05/29/2014   Procedure: INSERTION OF ARTERIOVENOUS (AV) GORE-TEX GRAFT ARM-LEFT;  Surgeon: Angelia Mould, MD;  Location: [facility];  Service: Vascular;  Laterality: Left;  . CHOLECYSTECTOMY  02/12/07  . INSERTION OF DIALYSIS CATHETER Right 05/27/2014   Procedure: INSERTION OF DIALYSIS CATHETER;  Surgeon: Angelia Mould, MD;  Location: [facility];  Service: Vascular;  Laterality: Right;  . REFRACTIVE SURGERY  07/29/09   Dr. Bing Plume  . TONSILLECTOMY      OB History    No data available       Home Medications    Prior to Admission medications   Medication Sig Start Date End Date Taking?  Authorizing Provider  acetaminophen (TYLENOL) 325 MG tablet Take 650 mg by mouth every 6 (six) hours as needed for moderate pain.     [provider]  amLODipine (NORVASC) 10 MG tablet TAKE 1 TABLET BY MOUTH EVERY DAY Patient taking differently: TAKE 10mg  BY MOUTH EVERY DAY 12/30/15   Carollee Herter, Yvonne R, DO  carvedilol (COREG) 25 MG tablet TAKE 1 TABLET (25 MG TOTAL) BY MOUTH 2 (TWO) TIMES DAILY WITH A MEAL. 09/19/17   Roma Schanz R, DO  furosemide (LASIX) 40 MG tablet TAKE 1 TABLET BY MOUTH EVERY DAY Patient taking differently: TAKE 40mg  BY MOUTH EVERY DAY 09/19/17   Carollee Herter, Alferd Apa, DO  isosorbide mononitrate (IMDUR) 30 MG 24 hr tablet Take 1 tablet (30 mg total) by mouth 2 (two) times daily. 07/07/17   Ann Held, DO  lisinopril (PRINIVIL,ZESTRIL) 40 MG tablet Take 1 tablet (40 mg total) by mouth at bedtime. 09/27/17   Ann Held, DO  Multiple Vitamin (MULTIVITAMIN WITH MINERALS) TABS tablet Take 2 tablets by mouth daily.    [provider]    Family History Family History  Problem Relation Age of Onset  . Coronary artery disease Other   . Diabetes Other   . Hypertension Other   . Arthritis Other     Social History Social History   Tobacco Use  . Smoking status: Never Smoker  . Smokeless tobacco: Never Used  . Tobacco comment: Married x's 55yrs, 6 kids-scattered [employer]. Retired Consulting civil engineer  Substance Use Topics  . Alcohol use: No  . Drug use: No     Allergies   Hydralazine and Robaxin [methocarbamol]   Review of Systems Review of Systems  Constitutional: Negative for diaphoresis and fever.  HENT: Positive for congestion and rhinorrhea. Negative for facial swelling.   Respiratory: Positive for shortness of breath. Negative for wheezing.   Cardiovascular: Positive for chest pain. Negative for orthopnea, leg swelling and PND.  Gastrointestinal: Positive for diarrhea, nausea and vomiting. Negative for abdominal pain.  Genitourinary: Negative for dysuria.  Skin: Negative for rash.  All other systems reviewed and are negative.    Physical Exam Updated Vital Signs There were no vitals taken for this visit.  Physical Exam  Constitutional: She appears well-developed and well-nourished. No distress.  HENT:  Head: Normocephalic and atraumatic.  Nose: Nose normal.  Mouth/Throat: Oropharynx is clear and moist. No oropharyngeal exudate.  Eyes:  Conjunctivae are normal. Pupils are equal, round, and reactive to light.  Neck: Normal range of motion. Neck supple. No JVD present.  Cardiovascular: Normal rate, regular rhythm, normal heart sounds and intact distal pulses.  Pulmonary/Chest: No stridor. No respiratory distress. She has no wheezes.  Abdominal: Soft. Bowel sounds are normal. She exhibits no mass. There is no tenderness. There is no rebound and no guarding.  Musculoskeletal: Normal range of motion. She exhibits no edema.  Neurological: She is alert. She displays normal reflexes.  Skin: Skin is warm and dry. Capillary refill takes less than 2 seconds.  Psychiatric: She has a normal mood and affect.  Nursing note and vitals reviewed.    ED Treatments / Results  Labs (all labs ordered are listed, but only abnormal results are displayed)  Results for orders placed or performed during the hospital encounter of 11/07/17  CBC with Differential  Result Value Ref Range   WBC 7.1 4.0 - 10.5 K/uL   RBC 2.75 (L) 3.87 - 5.11 MIL/uL   Hemoglobin 8.0 (  L) 12.0 - 15.0 g/dL   HCT 26.4 (L) 36.0 - 46.0 %   MCV 96.0 78.0 - 100.0 fL   MCH 29.1 26.0 - 34.0 pg   MCHC 30.3 30.0 - 36.0 g/dL   RDW 21.7 (H) 11.5 - 15.5 %   Platelets 175 150 - 400 K/uL   Neutrophils Relative % 84 %   Lymphocytes Relative 10 %   Monocytes Relative 4 %   Eosinophils Relative 2 %   Basophils Relative 0 %   Neutro Abs 6.0 1.7 - 7.7 K/uL   Lymphs Abs 0.7 0.7 - 4.0 K/uL   Monocytes Absolute 0.3 0.1 - 1.0 K/uL   Eosinophils Absolute 0.1 0.0 - 0.7 K/uL   Basophils Absolute 0.0 0.0 - 0.1 K/uL   RBC Morphology POLYCHROMASIA PRESENT   Comprehensive metabolic panel  Result Value Ref Range   Sodium 132 (L) 135 - 145 mmol/L   Potassium 3.5 3.5 - 5.1 mmol/L   Chloride 92 (L) 101 - 111 mmol/L   CO2 28 22 - 32 mmol/L   Glucose, Bld 155 (H) 65 - 99 mg/dL   BUN 18 6 - 20 mg/dL   Creatinine, Ser 3.69 (H) 0.44 - 1.00 mg/dL   Calcium 8.2 (L) 8.9 - 10.3 mg/dL   Total  Protein 6.9 6.5 - 8.1 g/dL   Albumin 2.9 (L) 3.5 - 5.0 g/dL   AST 43 (H) 15 - 41 U/L   ALT 24 14 - 54 U/L   Alkaline Phosphatase 120 38 - 126 U/L   Total Bilirubin 1.0 0.3 - 1.2 mg/dL   GFR calc non Af Amer 11 (L) >60 mL/min   GFR calc Af Amer 13 (L) >60 mL/min   Anion gap 12 5 - 15   Ct Hip Left Wo Contrast  Result Date: 10/20/2017 CLINICAL DATA:  Left hip pain secondary to a fall yesterday. EXAM: CT OF THE LEFT HIP WITHOUT CONTRAST TECHNIQUE: Multidetector CT imaging of the left hip was performed according to the standard protocol. Multiplanar CT image reconstructions were also generated. COMPARISON:  Radiographs dated 10/20/2017 FINDINGS: Bones/Joint/Cartilage There is no acute fracture or dislocation. There is mild to moderate osteoarthritis of the left hip with marginal osteophyte formation and subcortical cyst formation in the acetabulum and femoral head. The area of concern on radiographs is normal on the CT scan. There are chronic soft tissue calcifications adjacent to the greater and lesser trochanters. Arterial vascular calcification around the hip. Chronic calcific tendinopathy of the origin of the hamstring tendons. No significant acute soft tissue abnormality. IMPRESSION: 1. No acute abnormality of the bones of the left hip. 2. Arthritic changes and other degenerative changes around the left hip as described. Electronically Signed   By: Lorriane Shire M.D.   On: 10/20/2017 11:50   Dg Hip Unilat With Pelvis 2-3 Views Left  Result Date: 10/20/2017 CLINICAL DATA:  Left flank/hip pain after a fall yesterday. Pt states she tripped over curb. EXAM: DG HIP (WITH OR WITHOUT PELVIS) 2-3V LEFT COMPARISON:  05/22/2014 FINDINGS: There is a linear lucency in the trochanteric region on the external rotation view. This raises question of minimally displaced intertrochanteric fracture. No evidence for dislocation. There is extensive atherosclerotic calcification, showing significant change since 2015.  Otherwise the pelvis appears intact. IMPRESSION: Question of left intertrochanteric fracture. CT is recommended for further evaluation. Electronically Signed   By: Nolon Nations M.D.   On: 10/20/2017 09:28    EKG  EKG Interpretation  Date/Time:  Tuesday November 07 2017 00:06:44 EST Ventricular Rate:  91 PR Interval:  196 QRS Duration: 82 QT Interval:  376 QTC Calculation: 462 R Axis:   66 Text Interpretation:  Normal sinus rhythm Marked ST abnormality, possible inferior subendocardial injury Abnormal ECG When compared with ECG of 08/17/2017, No significant change was found Confirmed by Delora Fuel ([id number]) on 11/07/2017 12:24:27 AM        Procedures Procedures (including critical care time)  Medications Ordered in ED Medications  furosemide (LASIX) injection 40 mg (not administered)  nitroGLYCERIN (NITROGLYN) 2 % ointment 1 inch (not administered)    MDM Reviewed: previous chart, nursing note and vitals Reviewed previous: labs Interpretation: ECG, labs and x-ray (pulmonary edema by me on CXR.  Improved BUN and CR from previous.  ) Total time providing critical care: 75-105 minutes (BIPAP INITIATED BY ME IN THE ED FOR SOB). This excludes time spent performing separately reportable procedures and services. Consults: admitting MD   CRITICAL CARE Performed by: Carlisle Beers Total critical care time: 75 minutes Critical care time was exclusive of separately billable procedures and treating other patients. Critical care was necessary to treat or prevent imminent or life-threatening deterioration. Critical care was time spent personally by me on the following activities: development of treatment plan with patient and/or surrogate as well as nursing, discussions with consultants, evaluation of patient's response to treatment, examination of patient, obtaining history from patient or surrogate, ordering and performing treatments and interventions, ordering and review of  laboratory studies, ordering and review of radiographic studies, pulse oximetry and re-evaluation of patient's condition.  Final Clinical Impressions(s) / ED Diagnoses   Pulmonary edema:  Will admit to hospital  Awaiting call back from nephrology with further instructions   Hinley Brimage, MD 11/07/17 5750

## 2017-11-07 NOTE — ED Triage Notes (Signed)
Patient arrived with EMS from home reports chronic SOB with productive cough and diarrhea for 1 1/2 months , denies fever or chills, hemodialysis q Mon/Wed/Friday , completed her hemodialysis today .

## 2017-11-07 NOTE — H&P (Signed)
History and Physical    [name] [name] ZSW:[id number] DOB: October 13, 1941 DOA: 11/07/2017  PCP: Ann Held, DO  Patient coming from: Home.  Chief Complaint: Shortness of breath.  HPI: [name] [name] is a 77 y.o. female with history of ESRD on hemodialysis on Monday Wednesday Friday, hypertension, diabetes mellitus type 2, anemia was brought to the ER after patient started developing shortness of breath last evening after dialysis when patient reached home.  Has had some cough denies any chest pain denies any fever or chills.  Has been having some nausea vomiting and diarrhea last 1 week.  ED Course: In the ER patient blood pressure was found to be elevated and patient was hypoxic and had to be placed on BiPAP.  Chest x-ray shows congestion.  Troponin was mildly elevated.  EKG shows inferolateral leads ST depression.  On-call nephrologist has been consulted for dialysis and patient is being admitted for acute respiratory failure likely from pulmonary edema and also concerning EKG changes.  Review of Systems: As per HPI, rest all negative.   Past Medical History:  Diagnosis Date  . DEPRESSION   . DIABETES MELLITUS, TYPE I   . GERD   . GLAUCOMA   . Headache(784.0)   . Hepatitis B carrier ([place])    05/2009: neg Hep C; Hep B: core pos, Surf neg; fatty liver US 8/10 - 7/13  . HYPERLIPIDEMIA   . HYPERTENSION   . Kidney failure    dialysis 3 times per week  . OSTEOPENIA   . POSTMENOPAUSAL STATUS   . Unspecified vitamin D deficiency     Past Surgical History:  Procedure Laterality Date  . AV FISTULA PLACEMENT Left 05/29/2014   Procedure: INSERTION OF ARTERIOVENOUS (AV) GORE-TEX GRAFT ARM-LEFT;  Surgeon: Angelia Mould, MD;  Location: [place];  Service: Vascular;  Laterality: Left;  . CHOLECYSTECTOMY  02/12/07  . INSERTION OF DIALYSIS CATHETER Right 05/27/2014   Procedure: INSERTION OF DIALYSIS CATHETER;  Surgeon: Angelia Mould, MD;  Location: [place];  Service:  Vascular;  Laterality: Right;  . REFRACTIVE SURGERY  07/29/09   Dr. Bing Plume  . TONSILLECTOMY       reports that  has never smoked. she has never used smokeless tobacco. She reports that she does not drink alcohol or use drugs.  Allergies  Allergen Reactions  . Hydralazine Itching  . Robaxin [Methocarbamol]     Dizziness     Family History  Problem Relation Age of Onset  . Coronary artery disease Other   . Diabetes Other   . Hypertension Other   . Arthritis Other     Prior to Admission medications   Medication Sig Start Date End Date Taking? Authorizing Provider  acetaminophen (TYLENOL) 325 MG tablet Take 650 mg by mouth every 6 (six) hours as needed for moderate pain.    Yes [provider]  amLODipine (NORVASC) 10 MG tablet TAKE 1 TABLET BY MOUTH EVERY DAY 12/30/15  Yes Lowne Chase, Yvonne R, DO  carvedilol (COREG) 25 MG tablet TAKE 1 TABLET (25 MG TOTAL) BY MOUTH 2 (TWO) TIMES DAILY WITH A MEAL. 09/19/17  Yes Roma Schanz R, DO  ferrous sulfate 325 (65 FE) MG tablet Take 325-650 mg by mouth daily with breakfast.   Yes [provider]  furosemide (LASIX) 40 MG tablet TAKE 1 TABLET BY MOUTH EVERY DAY 09/19/17  Yes Roma Schanz R, DO  isosorbide mononitrate (IMDUR) 30 MG 24 hr tablet Take 1 tablet (30  mg total) by mouth 2 (two) times daily. 07/07/17  Yes Roma Schanz R, DO  lisinopril (PRINIVIL,ZESTRIL) 40 MG tablet Take 1 tablet (40 mg total) by mouth at bedtime. 09/27/17  Yes Ann Held, DO  Multiple Vitamin (MULTIVITAMIN WITH MINERALS) TABS tablet Take 2 tablets by mouth daily.   Yes [provider]    Physical Exam: Vitals:   11/07/17 0230 11/07/17 0245 11/07/17 0256 11/07/17 0300  BP: (!) 211/65 (!) 206/71  (!) 196/62  Pulse: 90 85 84 77  Resp: 20 (!) 25 (!) 25 (!) 22  SpO2: 90% 95% 100% 100%      Constitutional: Moderately built and nourished. Vitals:   11/07/17 0230 11/07/17 0245 11/07/17 0256 11/07/17 0300    BP: (!) 211/65 (!) 206/71  (!) 196/62  Pulse: 90 85 84 77  Resp: 20 (!) 25 (!) 25 (!) 22  SpO2: 90% 95% 100% 100%   Eyes: Anicteric no pallor. ENMT: No discharge from the ears eyes nose or mouth. Neck: No mass palpated no JVD appreciated. Respiratory: No rhonchi or crepitations. Cardiovascular: S1-S2 heard no murmurs appreciated. Abdomen: Soft nontender bowel sounds present. Musculoskeletal: No edema.  No joint effusion. Skin: No rash.  Skin appears warm. Neurologic: Alert awake oriented to time place and person.  Moves all extremities. Psychiatric: Appears normal.  Normal affect.   Labs on Admission: I have personally reviewed following labs and imaging studies  CBC: Recent Labs  Lab 11/07/17 0015  WBC 7.1  NEUTROABS 6.0  HGB 8.0*  HCT 26.4*  MCV 96.0  PLT 621   Basic Metabolic Panel: Recent Labs  Lab 11/07/17 0015  NA 132*  K 3.5  CL 92*  CO2 28  GLUCOSE 155*  BUN 18  CREATININE 3.69*  CALCIUM 8.2*   GFR: CrCl cannot be calculated (Unknown ideal weight.). Liver Function Tests: Recent Labs  Lab 11/07/17 0015  AST 43*  ALT 24  ALKPHOS 120  BILITOT 1.0  PROT 6.9  ALBUMIN 2.9*   No results for input(s): LIPASE, AMYLASE in the last 168 hours. Recent Labs  Lab 11/07/17 0224  AMMONIA 29   Coagulation Profile: No results for input(s): INR, PROTIME in the last 168 hours. Cardiac Enzymes: No results for input(s): CKTOTAL, CKMB, CKMBINDEX, TROPONINI in the last 168 hours. BNP (last 3 results) No results for input(s): PROBNP in the last 8760 hours. HbA1C: No results for input(s): HGBA1C in the last 72 hours. CBG: No results for input(s): GLUCAP in the last 168 hours. Lipid Profile: No results for input(s): CHOL, HDL, LDLCALC, TRIG, CHOLHDL, LDLDIRECT in the last 72 hours. Thyroid Function Tests: No results for input(s): TSH, T4TOTAL, FREET4, T3FREE, THYROIDAB in the last 72 hours. Anemia Panel: No results for input(s): VITAMINB12, FOLATE, FERRITIN,  TIBC, IRON, RETICCTPCT in the last 72 hours. Urine analysis:    Component Value Date/Time   COLORURINE YELLOW 11/07/2017 0229   APPEARANCEUR CLEAR 11/07/2017 0229   LABSPEC 1.012 11/07/2017 0229   PHURINE 8.0 11/07/2017 0229   GLUCOSEU 50 (A) 11/07/2017 0229   HGBUR NEGATIVE 11/07/2017 0229   BILIRUBINUR NEGATIVE 11/07/2017 0229   KETONESUR NEGATIVE 11/07/2017 0229   PROTEINUR 100 (A) 11/07/2017 0229   UROBILINOGEN 2.0 (H) 04/01/2008 1751   NITRITE NEGATIVE 11/07/2017 0229   LEUKOCYTESUR TRACE (A) 11/07/2017 0229   Sepsis Labs: @LABRCNTIP (procalcitonin:4,lacticidven:4) )No results found for this or any previous visit (from the past 240 hour(s)).   Radiological Exams on Admission: Dg Chest 2 View  Result Date:  11/07/2017 CLINICAL DATA:  Dyspnea with cough times 1-1/2 months. EXAM: CHEST  2 VIEW COMPARISON:  08/17/2017 FINDINGS: Stable cardiomegaly with aortic atherosclerosis. Diffuse interval increase in pulmonary edema since prior with trace bilateral pleural effusions blunting the costophrenic angles. Degenerative change noted about the [facility] and glenohumeral joints as well as dorsal spine. Cholecystectomy clips are present. IMPRESSION: Stable cardiomegaly with aortic atherosclerosis. Diffuse interstitial edema consistent CHF. Trace bilateral pleural effusions blunting the costophrenic angles. Electronically Signed   By: Ashley Royalty M.D.   On: 11/07/2017 03:08    EKG: Independently reviewed.  Normal sinus rhythm with inferolateral ST depression.  Assessment/Plan Active Problems:   Essential hypertension   End stage renal disease (HCC)   Pulmonary edema   Acute respiratory failure with hypoxia (HCC)   Nausea vomiting and diarrhea    1. Acute respiratory failure with hypoxia likely from acute pulmonary edema -patient is on BiPAP and nephrologist on-call has been notified for early dialysis.  Cycle cardiac markers keep patient on PRN IV labetalol for systolic blood pressure more than  160.  Check 2D echo. 2. Positive troponin with concerning EKG changes -patient is already on MD hold.  We will add aspirin.  Check 2D echo cycle cardiac markers.  Does not have any chest pain activity at this time. 3. Hypertensive urgency probably contributing to patient's symptoms.  In addition to home medications including amlodipine Coreg Imdur and lisinopril I have placed patient on PRN IV hydralazine.  Closely follow blood pressure trends.  I think patient symptoms may improve with dialysis.  Otherwise patient may need to be on antihypertensive infusions. 4. History of diabetes per chart patient presently not on medications.  Will keep patient on sliding scale coverage. 5. Nausea vomiting and diarrhea.  Abdomen appears benign.  LFTs unremarkable except for mildly elevated AST.  Will check stool studies.  If nausea vomiting diarrhea persist may consider further radiological exam. 6. Fall on October 20, 2017 with left hip pain -CT scan done on October 20, 2017 was unremarkable.  Will get x-ray of the lumbar spine since patient still has pain.  Lumbar spine x-ray shows any compression fracture will need further studies. 7. Anemia likely from ESRD -follow CBC.   DVT prophylaxis: Heparin. Code Status: Full code. Family Communication: Family at the bedside. Disposition Plan: Home. Consults called: Nephrology. Admission status: Inpatient.   Rise Patience MD [facility] Pager [phone].  If 7PM-7AM, please contact night-coverage [email or web address] Password [facility]  11/07/2017, 4:26 AM

## 2017-11-07 NOTE — ED Provider Notes (Signed)
Case d/w Dr. Marval Regal, patient has been added to the list for dialysis   [name] Dehner, MD 11/07/17 3832

## 2017-11-07 NOTE — ED Notes (Signed)
Pt tolerating bipap well, no further urine output, pt encouraged to use purewick that is in place.

## 2017-11-07 NOTE — ED Notes (Signed)
Dr. Wendee Beavers made aware of pts elevated troponin. States to keep pt NPO until dialysis and hold PO medication. States that he will follow up with nephrology.

## 2017-11-07 NOTE — Consult Note (Signed)
[facility] Renal Consultation Note    Indication for Consultation:  Management of ESRD/hemodialysis, anemia, hypertension/volume, and secondary hyperparathyroidism. PCP:  HPI: [name] [name] is a 77 y.o. female with ESRD, DM, HTN, hyperlipidemia, GERD who was admitted with dyspnea/volume overload.  Pt reports that she was in usual state of health until 3 weeks ago when she fell and injured her R hip. No fractures on x-ray, but was having issues getting around. Has had some cough/mucus production, occ N/V, and intermittent dyspnea since that time. Last evening, despite completing a full HD yesterday, she woke up acutely dyspneic with CP. + mild abdominal pain and nausea as well. No fever or chills. Presented to ED for evaluation. Found to be hypoxic and hypertensive. Labs with K 3.5, ^ BNP, trop 0.32, WBC 7.1, Hgb 8. CXR with diffuse interstitial edema and B pleural effusions. She has required bi-pap for oxygenation.  From renal standpoint, dialyzes MWF at [facility]. Last HD was yesterday 1/7 which she completed in entirety, but somehow left 2kg above her EDW. Per report, no fluid was removed, unclear story. Called the HD unit and all involved staff are not working today, they will investigate. Uses AVG without recent issues.  Past Medical History:  Diagnosis Date  . DEPRESSION   . DIABETES MELLITUS, TYPE I   . GERD   . GLAUCOMA   . Headache(784.0)   . Hepatitis B carrier ([place])    05/2009: neg Hep C; Hep B: core pos, Surf neg; fatty liver US 8/10 - 7/13  . HYPERLIPIDEMIA   . HYPERTENSION   . Kidney failure    dialysis 3 times per week  . OSTEOPENIA   . POSTMENOPAUSAL STATUS   . Unspecified vitamin D deficiency    Past Surgical History:  Procedure Laterality Date  . AV FISTULA PLACEMENT Left 05/29/2014   Procedure: INSERTION OF ARTERIOVENOUS (AV) GORE-TEX GRAFT ARM-LEFT;  Surgeon: Angelia Mould, MD;  Location: [place];  Service: Vascular;  Laterality: Left;   . CHOLECYSTECTOMY  02/12/07  . INSERTION OF DIALYSIS CATHETER Right 05/27/2014   Procedure: INSERTION OF DIALYSIS CATHETER;  Surgeon: Angelia Mould, MD;  Location: [place];  Service: Vascular;  Laterality: Right;  . REFRACTIVE SURGERY  07/29/09   Dr. Bing Plume  . TONSILLECTOMY     Family History  Problem Relation Age of Onset  . Coronary artery disease Other   . Diabetes Other   . Hypertension Other   . Arthritis Other    Social History:  reports that  has never smoked. she has never used smokeless tobacco. She reports that she does not drink alcohol or use drugs.  ROS: As per HPI otherwise negative.  Physical Exam: Vitals:   11/07/17 0630 11/07/17 0700 11/07/17 0722 11/07/17 0745  BP: (!) 181/67 (!) 181/74 (!) 170/54 (!) 172/61  Pulse: 76 74 69 70  Resp: 17 20 (!) 22 17  SpO2: 100% 100% 100% 100%     General: Well developed, in moderate resp distress (on bi-pap) Head: Normocephalic, atraumatic, sclera non-icteric Neck: Supple without lymphadenopathy/masses. JVD not elevated. Lungs: CTA in upper lobes, RLL rales present. No wheezing. Heart: RRR with normal S1, S2. No murmurs, rubs, or gallops appreciated. Abdomen: Soft, non-tender, non-distended with normoactive bowel sounds. No rebound/guarding. No obvious abdominal masses. Musculoskeletal:  Strength and tone appear normal for age. Lower extremities: No edema or ischemic changes, no open wounds. Neuro: Alert and oriented X 3. Moves all extremities spontaneously. Psych:  Responds to questions  appropriately with a normal affect. Dialysis Access: LUE AVG + bruit  Allergies  Allergen Reactions  . Hydralazine Itching  . Robaxin [Methocarbamol]     Dizziness    Prior to Admission medications   Medication Sig Start Date End Date Taking? Authorizing Provider  acetaminophen (TYLENOL) 325 MG tablet Take 650 mg by mouth every 6 (six) hours as needed for moderate pain.    Yes [provider]  amLODipine (NORVASC) 10 MG  tablet TAKE 1 TABLET BY MOUTH EVERY DAY 12/30/15  Yes Lowne Chase, Yvonne R, DO  carvedilol (COREG) 25 MG tablet TAKE 1 TABLET (25 MG TOTAL) BY MOUTH 2 (TWO) TIMES DAILY WITH A MEAL. 09/19/17  Yes Roma Schanz R, DO  ferrous sulfate 325 (65 FE) MG tablet Take 325-650 mg by mouth daily with breakfast.   Yes [provider]  furosemide (LASIX) 40 MG tablet TAKE 1 TABLET BY MOUTH EVERY DAY 09/19/17  Yes Roma Schanz R, DO  isosorbide mononitrate (IMDUR) 30 MG 24 hr tablet Take 1 tablet (30 mg total) by mouth 2 (two) times daily. 07/07/17  Yes Roma Schanz R, DO  lisinopril (PRINIVIL,ZESTRIL) 40 MG tablet Take 1 tablet (40 mg total) by mouth at bedtime. 09/27/17  Yes Ann Held, DO  Multiple Vitamin (MULTIVITAMIN WITH MINERALS) TABS tablet Take 2 tablets by mouth daily.   Yes [provider]   Current Facility-Administered Medications  Medication Dose Route Frequency Provider Last Rate Last Dose  . acetaminophen (TYLENOL) tablet 650 mg  650 mg Oral Q6H PRN Rise Patience, MD       Or  . acetaminophen (TYLENOL) suppository 650 mg  650 mg Rectal Q6H PRN Rise Patience, MD      . amLODipine (NORVASC) tablet 10 mg  10 mg Oral Daily Rise Patience, MD      . aspirin tablet 325 mg  325 mg Oral Daily Rise Patience, MD      . carvedilol (COREG) tablet 25 mg  25 mg Oral BID WC Rise Patience, MD      . ferrous sulfate tablet 325-650 mg  325-650 mg Oral Q breakfast Rise Patience, MD      . furosemide (LASIX) tablet 40 mg  40 mg Oral Daily Rise Patience, MD      . heparin injection 5,000 Units  5,000 Units Subcutaneous Q8H Rise Patience, MD      . insulin aspart (novoLOG) injection 0-9 Units  0-9 Units Subcutaneous TID WC Rise Patience, MD      . isosorbide mononitrate (IMDUR) 24 hr tablet 30 mg  30 mg Oral BID Rise Patience, MD      . labetalol (NORMODYNE,TRANDATE) injection 5 mg  5 mg Intravenous  Q2H PRN Rise Patience, MD      . lisinopril (PRINIVIL,ZESTRIL) tablet 40 mg  40 mg Oral QHS Rise Patience, MD      . multivitamin with minerals tablet 1 tablet  1 tablet Oral Daily Rise Patience, MD      . ondansetron [facility]) tablet 4 mg  4 mg Oral Q6H PRN Rise Patience, MD       Or  . ondansetron [facility]) injection 4 mg  4 mg Intravenous Q6H PRN Rise Patience, MD       Current Outpatient Medications  Medication Sig Dispense Refill  . acetaminophen (TYLENOL) 325 MG tablet Take 650 mg by mouth every 6 (six) hours  as needed for moderate pain.     Marland Kitchen amLODipine (NORVASC) 10 MG tablet TAKE 1 TABLET BY MOUTH EVERY DAY 90 tablet 3  . carvedilol (COREG) 25 MG tablet TAKE 1 TABLET (25 MG TOTAL) BY MOUTH 2 (TWO) TIMES DAILY WITH A MEAL. 60 tablet 3  . ferrous sulfate 325 (65 FE) MG tablet Take 325-650 mg by mouth daily with breakfast.    . furosemide (LASIX) 40 MG tablet TAKE 1 TABLET BY MOUTH EVERY DAY 30 tablet 3  . isosorbide mononitrate (IMDUR) 30 MG 24 hr tablet Take 1 tablet (30 mg total) by mouth 2 (two) times daily. 60 tablet 1  . lisinopril (PRINIVIL,ZESTRIL) 40 MG tablet Take 1 tablet (40 mg total) by mouth at bedtime. 90 tablet 1  . Multiple Vitamin (MULTIVITAMIN WITH MINERALS) TABS tablet Take 2 tablets by mouth daily.     Labs: Basic Metabolic Panel: Recent Labs  Lab 11/07/17 0015 11/07/17 0425  NA 132* 134*  K 3.5 3.5  CL 92* 94*  CO2 28 23  GLUCOSE 155* 89  BUN 18 21*  CREATININE 3.69* 3.94*  CALCIUM 8.2* 8.1*   Liver Function Tests: Recent Labs  Lab 11/07/17 0015  AST 43*  ALT 24  ALKPHOS 120  BILITOT 1.0  PROT 6.9  ALBUMIN 2.9*   Recent Labs  Lab 11/07/17 0224  AMMONIA 29   CBC: Recent Labs  Lab 11/07/17 0015 11/07/17 0425  WBC 7.1 6.0  NEUTROABS 6.0  --   HGB 8.0* 8.2*  HCT 26.4* 26.8*  MCV 96.0 97.5  PLT 175 168   Cardiac Enzymes: Recent Labs  Lab 11/07/17 0709  TROPONINI 0.38*   Studies/Results: Dg Chest  2 View  Result Date: 11/07/2017 CLINICAL DATA:  Dyspnea with cough times 1-1/2 months. EXAM: CHEST  2 VIEW COMPARISON:  08/17/2017 FINDINGS: Stable cardiomegaly with aortic atherosclerosis. Diffuse interval increase in pulmonary edema since prior with trace bilateral pleural effusions blunting the costophrenic angles. Degenerative change noted about the [facility] and glenohumeral joints as well as dorsal spine. Cholecystectomy clips are present. IMPRESSION: Stable cardiomegaly with aortic atherosclerosis. Diffuse interstitial edema consistent CHF. Trace bilateral pleural effusions blunting the costophrenic angles. Electronically Signed   By: Ashley Royalty M.D.   On: 11/07/2017 03:08   Dialysis Orders:  MWF at [facility] 3:30h, 400/800, EDW 60.5kg, 2K/2Ca, AVG, heparin 5000 bolus - Mircera 279mcg IV q 2 weeks (last 1/2) - Calcitriol 1.61mcg PO q HD - Venofer 100mg  x 10 ordered (s/p 3 of 10 so far) - Binders: Velphoro 1/meals  Assessment/Plan: 1.  Dyspnea/volume overload: On bi-pap currently, for urgent HD today to get volume down. 2.  ESRD: Usually MWF schedule, last HD yesterday. As above, HD today, then back to MWF schedule. 3.  Hypertension/volume: BP high with pulm edema. 4L UF today as tolerated/meds. 4.  Anemia: Hgb 8.2. ESA just given. 5.  Metabolic bone disease: Corr Ca ok, Phos pending. Continue binders/VDRA. 6.  Nutrition: Alb low, adding supps. 7.  DM: Per primary. 8.  Elevated troponin: ?demand ischemia. Echo pending. Per primary.  Veneta Penton, PA-C 11/07/2017, 11:21 AM  [facility] Pager: [phone]  Pt seen, examined and agree w A/P as above.  ESRD pt with pulm edema, 7 kg over dry wt if wt's are correct.  Suspect all vol overload.  Plan HD today, max UF as tolerated.  Kelly Splinter MD [employer] pager [phone]   11/07/2017, 3:05 PM

## 2017-11-07 NOTE — Procedures (Signed)
   I was present at this dialysis session, have reviewed the session itself and made  appropriate changes Kelly Splinter MD [place] pager [phone]   11/07/2017, 3:06 PM

## 2017-11-07 NOTE — Progress Notes (Signed)
Spoke to Dr. Wendee Beavers regarding the bipap orders. Order will be discontinued per order of Dr. Wendee Beavers as patient has been weened to nasal canula since receiving hemodialysis. Nursing will monitor respiratory status and contact attending if there are any changes in patient status once patient arrives.

## 2017-11-07 NOTE — Progress Notes (Signed)
Patient admitted earlier this am by my associate. Please refer to H and P for details regarding history, assessment, and plan.  Agree with plan. Pt currently at dialysis.  No new complaints reported.  Gen: pt in nad, alert and awake Pulm: on Bipap Cardiac: s1 and s2 present, no cyanosis  We will reassess next am.  Velvet Bathe

## 2017-11-07 NOTE — ED Notes (Signed)
Pt tolerating bipap well.  °

## 2017-11-07 NOTE — ED Notes (Signed)
Nephrology at bedside

## 2017-11-08 ENCOUNTER — Inpatient Hospital Stay (HOSPITAL_COMMUNITY): Payer: Medicare HMO

## 2017-11-08 ENCOUNTER — Encounter (HOSPITAL_COMMUNITY): Payer: Self-pay | Admitting: General Practice

## 2017-11-08 ENCOUNTER — Other Ambulatory Visit: Payer: Self-pay

## 2017-11-08 DIAGNOSIS — R748 Abnormal levels of other serum enzymes: Secondary | ICD-10-CM

## 2017-11-08 DIAGNOSIS — J9601 Acute respiratory failure with hypoxia: Secondary | ICD-10-CM

## 2017-11-08 LAB — MRSA PCR SCREENING: MRSA BY PCR: NEGATIVE

## 2017-11-08 LAB — GLUCOSE, CAPILLARY
GLUCOSE-CAPILLARY: 162 mg/dL — AB (ref 65–99)
GLUCOSE-CAPILLARY: 116 mg/dL — AB (ref 65–99)
Glucose-Capillary: 96 mg/dL (ref 65–99)
Glucose-Capillary: 147 mg/dL — ABNORMAL HIGH (ref 65–99)

## 2017-11-08 LAB — TROPONIN I
TROPONIN I: 0.47 ng/mL — AB (ref <0.03)
TROPONIN I: 0.46 ng/mL — AB (ref <0.03)

## 2017-11-08 MED ORDER — TRAMADOL HCL 50 MG PO TABS: 50.0000 mg | ORAL_TABLET | Freq: Once | ORAL | Status: DC | PRN

## 2017-11-08 MED ORDER — ORAL CARE MOUTH RINSE
15.0000 mL | Freq: Two times a day (BID) | OROMUCOSAL | Status: DC
  Administered 2017-11-08 – 2017-11-11 (×4): 15 mL via OROMUCOSAL

## 2017-11-08 MED ORDER — CYCLOBENZAPRINE HCL 5 MG PO TABS
5.0000 mg | ORAL_TABLET | Freq: Once | ORAL | Status: DC
  Filled 2017-11-08: qty 1

## 2017-11-08 MED ORDER — HEPARIN SODIUM (PORCINE) 1000 UNIT/ML DIALYSIS: 20.0000 [IU]/kg | INTRAMUSCULAR | Status: DC | PRN

## 2017-11-08 NOTE — Progress Notes (Signed)
Patient currently denies chest or any other pain 0/10. VS stable, O2 98% on [place] 3 l/min. No new orders for increased troponin.  Will continue to monitor,  Venetia Night, RN

## 2017-11-08 NOTE — Progress Notes (Signed)
Shift event: NP paged earlier with trop of .47 which was up from .43. NP suspected this was likely demand ischemia given her ESRD and Hypertensive urgency. BP started in 200 range and now down to 140. Additional EKG done which looked similar to one done on admission. Non specific ST wave abnormality with VR 75, NSR. Pt has had NO active CP. NP ordered another set of trops given her bump in result and abnormal EKG. Next trop was down to .46 so further trops cancelled. Continues to rest quietly and not c/o CP.  KJKG, NP Trop

## 2017-11-08 NOTE — Progress Notes (Signed)
[facility] Progress Note   Subjective:   Cramped during HD and all night. No dyspnea now. No CP. No diarrhea.  Objective Vitals:   11/08/17 0120 11/08/17 0554 11/08/17 0605 11/08/17 0831  BP: (!) 151/54 (!) 138/43  (!) 179/62  Pulse: 74 64  80  Resp: 17 18    Temp: 98.9 F (37.2 C) (!) 97.4 F (36.3 C)    TempSrc: Oral Oral    SpO2: 100% 98% 98% 100%  Weight:  60.4 kg (133 lb 2.5 oz)    Height:       Physical Exam General: Frail female, NAD Heart: RRR Lungs:  CTAB Extremities: No LE edema Dialysis Access: AVG  Additional Objective Labs: Basic Metabolic Panel: Recent Labs  Lab 11/07/17 0015 11/07/17 0425  NA 132* 134*  K 3.5 3.5  CL 92* 94*  CO2 28 23  GLUCOSE 155* 89  BUN 18 21*  CREATININE 3.69* 3.94*  CALCIUM 8.2* 8.1*   Liver Function Tests: Recent Labs  Lab 11/07/17 0015  AST 43*  ALT 24  ALKPHOS 120  BILITOT 1.0  PROT 6.9  ALBUMIN 2.9*   CBC: Recent Labs  Lab 11/07/17 0015 11/07/17 0425  WBC 7.1 6.0  NEUTROABS 6.0  --   HGB 8.0* 8.2*  HCT 26.4* 26.8*  MCV 96.0 97.5  PLT 175 168   Blood Culture    Component Value Date/Time   SDES BLOOD LEFT FOREARM  4 ML IN EACH BOTTLE 05/17/2014 1657   SDES BLOOD RIGHT FOREARM  4 ML IN [facility] BOTTLE 05/17/2014 1657   SPECREQUEST Normal 05/17/2014 1657   SPECREQUEST Normal 05/17/2014 1657   CULT  05/17/2014 1657    NO GROWTH 5 DAYS Performed at [place]  05/17/2014 1657    NO GROWTH 5 DAYS Performed at [place] 05/23/2014 FINAL 05/17/2014 1657   REPTSTATUS 05/23/2014 FINAL 05/17/2014 1657    Cardiac Enzymes: Recent Labs  Lab 11/07/17 0709 11/07/17 2010 11/08/17 0153 11/08/17 0256  TROPONINI 0.38* 0.43* 0.47* 0.46*   CBG: Recent Labs  Lab 11/07/17 0819 11/07/17 1850 11/07/17 2127 11/08/17 0739  GLUCAP 91 95 145* 96   Studies/Results: Dg Chest 2 View  Result Date: 11/07/2017 CLINICAL DATA:  Dyspnea with cough times 1-1/2  months. EXAM: CHEST  2 VIEW COMPARISON:  08/17/2017 FINDINGS: Stable cardiomegaly with aortic atherosclerosis. Diffuse interval increase in pulmonary edema since prior with trace bilateral pleural effusions blunting the costophrenic angles. Degenerative change noted about the [facility] and glenohumeral joints as well as dorsal spine. Cholecystectomy clips are present. IMPRESSION: Stable cardiomegaly with aortic atherosclerosis. Diffuse interstitial edema consistent CHF. Trace bilateral pleural effusions blunting the costophrenic angles. Electronically Signed   By: Ashley Royalty M.D.   On: 11/07/2017 03:08   Dg Lumbar Spine 2-3 Views  Result Date: 11/07/2017 CLINICAL DATA:  Low back pain. EXAM: LUMBAR SPINE - 2-3 VIEW COMPARISON:  None FINDINGS: Normal alignment of the lumbar spine. The vertebral body heights are well preserved. Multi level ventral endplate spurring is identified with mild disc space narrowing. No acute fractures or subluxations identified. Aortic atherosclerosis noted. IMPRESSION: 1. Multi level lumbar degenerative disc disease. 2.  Aortic Atherosclerosis (ICD10-I70.0). Electronically Signed   By: Kerby Moors M.D.   On: 11/07/2017 19:58   Medications:  . amLODipine  10 mg Oral Daily  . aspirin  325 mg Oral Daily  . calcitRIOL  1.5 mcg Oral Once per day on Mon Wed Fri  .  carvedilol  25 mg Oral BID WC  . cyclobenzaprine  5 mg Oral Once  . feeding supplement (PRO-STAT SUGAR FREE 64)  30 mL Oral BID  . ferrous sulfate  325-650 mg Oral Q breakfast  . furosemide  40 mg Oral Daily  . heparin  5,000 Units Subcutaneous Q8H  . insulin aspart  0-9 Units Subcutaneous TID WC  . isosorbide mononitrate  30 mg Oral BID  . lisinopril  40 mg Oral QHS  . mouth rinse  15 mL Mouth Rinse BID  . multivitamin with minerals  1 tablet Oral Daily  . sucroferric oxyhydroxide  500 mg Oral TID WC    Dialysis Orders: MWF at [facility] 3:30h, 400/800, EDW 60.5kg, 2K/2Ca, AVG, heparin 5000 bolus -  Mircera 22mcg IV q 2 weeks (last 1/2) - Calcitriol 1.52mcg PO q HD - Venofer 100mg  x 10 ordered (s/p 3 of 10 so far) - Binders: Velphoro 1/meals  Assessment/Plan: 1.  Dyspnea/volume overload: On bi-pap on admit, 2.5L UF removed, symptoms resolved and came off bipap quickly, + cramping all night as if she was dry. Suspect volume was not as big of issues as initially suspected. 2.  ESRD: S/p HD yesterday, looks good today. For very short HD today to keep on MWF schedule, then likely can be discharged. 3.  Hypertension/volume: BP high with pulm edema. S/p 2L yesterday, back to EDW. 4.  Anemia: Hgb 8.2. ESA just given. 5.  Metabolic bone disease: Corr Ca ok, Phos pending. Continue binders/VDRA. 6.  Nutrition: Alb low, adding supps. 7.  DM: Per primary. 8.  Elevated troponin: ?demand ischemia. Trop stable. Echo pending. Per primary.   [name] Penton, PA-C 11/08/2017, 10:07 AM  [facility] Pager: [phone]  Pt seen, examined and agree w A/P as above.  Not clear cause of acute pulm edema, not grossly vol overloaded so cardiac causes may be a possibility.  For echo and short HD today.  Looks much better.  Kelly Splinter MD [employer] pager [phone]   11/08/2017, 12:26 PM

## 2017-11-08 NOTE — Progress Notes (Signed)
Troponin increasing, 0.47 now. Patient asleep, denies chest pain 0/10. Paged NP on call K.Kirby and notified her of results.  Denzil Bristol, RN

## 2017-11-08 NOTE — Progress Notes (Signed)
EKG ordered.  EKG in progress.  Will continue to monitor.  Aeson Sawyers, RN

## 2017-11-08 NOTE — Consult Note (Signed)
Cardiology Consultation:    Patient ID: [name] [name]; [id number]; 1941/05/11   Admit date: 11/07/2017 Date of Consult: 11/08/2017  Primary Care Provider: Carollee Herter, Alferd Apa, DO Primary Cardiologist: Jenkins Rouge, MD   Patient Profile:   [name] [name] is a 77 y.o. female with a hx of DM, HTN, HLD, ESRD on HD (MWF), chronic diastolic CHF and aortic stenosis who is being seen today for the evaluation of abnormal EKG at the request of Dr. Sarajane Jews.   She was admitted 11/25/16-11/27/16 for acute respiratory failure with hypoxia in setting of missed dialysis and malignant hypertension. Required IV nitro. BP improved with HD. Echo 11/27/16 showed LV function of 55-60%, grade 2 DD, mild to moderate AS, mild dilated LA, mild reduced RV function and PA pressure of 31 mm HG. Refereed for further evaluation.   The patient was seen by me 01/17/17 to establish cardiac care and syncope during dialysis. She was euvolemic. Recommended stress test given cardiac risk factors of HTN, ESRD and DM.   History of Present Illness:   [name] [name] presented for acute SOB and chest pain by EMS mid night Monday. She had normal dialysis session on Monday. She went home and slept as normal however wok up with sudden onset chest pain. About 3 weeks ago, she fell and injured R him. No fracture. Afterwards she had intermittent cough, mucus production, dyspnea, abdominal pain and nausea. No fever or chills. She had normal dialysis however left 2kg above her normal EDW.   The patient found hypoxic and hypertensive. CXR with diffuse interstitial edema and pleural effusions requiring BiPAP. She underwent urgent dialysis yesterday with improved symptoms.   EKG on presentation showed inferior lateral depression which resolved on repeat EKG - personally reviewed. Troponin trend 0.38-->0.43-->0.47-->0.46. Hemoglobin of 8.2 today.   Denies recurrent chest pain. Poor historian. Hx obtained by review of chart.   Past Medical  History:  Diagnosis Date  . DEPRESSION   . DIABETES MELLITUS, TYPE I   . GERD   . GLAUCOMA   . Headache(784.0)   . Hepatitis B carrier ([place])    05/2009: neg Hep C; Hep B: core pos, Surf neg; fatty liver US 8/10 - 7/13  . HYPERLIPIDEMIA   . HYPERTENSION   . Kidney failure    dialysis 3 times per week  . OSTEOPENIA   . POSTMENOPAUSAL STATUS   . Pulmonary edema   . Unspecified vitamin D deficiency     Past Surgical History:  Procedure Laterality Date  . AV FISTULA PLACEMENT Left 05/29/2014   Procedure: INSERTION OF ARTERIOVENOUS (AV) GORE-TEX GRAFT ARM-LEFT;  Surgeon: Angelia Mould, MD;  Location: [facility];  Service: Vascular;  Laterality: Left;  . CHOLECYSTECTOMY  02/12/07  . INSERTION OF DIALYSIS CATHETER Right 05/27/2014   Procedure: INSERTION OF DIALYSIS CATHETER;  Surgeon: Angelia Mould, MD;  Location: [facility];  Service: Vascular;  Laterality: Right;  . REFRACTIVE SURGERY  07/29/09   Dr. Bing Plume  . TONSILLECTOMY       Inpatient Medications: Scheduled Meds: . amLODipine  10 mg Oral Daily  . aspirin  325 mg Oral Daily  . calcitRIOL  1.5 mcg Oral Once per day on Mon Wed Fri  . carvedilol  25 mg Oral BID WC  . cyclobenzaprine  5 mg Oral Once  . feeding supplement (PRO-STAT SUGAR FREE 64)  30 mL Oral BID  . ferrous sulfate  325-650 mg Oral Q breakfast  . furosemide  40 mg Oral Daily  .  heparin  5,000 Units Subcutaneous Q8H  . insulin aspart  0-9 Units Subcutaneous TID WC  . isosorbide mononitrate  30 mg Oral BID  . lisinopril  40 mg Oral QHS  . mouth rinse  15 mL Mouth Rinse BID  . multivitamin with minerals  1 tablet Oral Daily  . sucroferric oxyhydroxide  500 mg Oral TID WC   Continuous Infusions:  PRN Meds: acetaminophen **OR** acetaminophen, labetalol, ondansetron **OR** ondansetron (ZOFRAN) IV, traMADol  Allergies:    Allergies  Allergen Reactions  . Hydralazine Itching  . Robaxin [Methocarbamol]     Dizziness     Social History:   Social History     Socioeconomic History  . Marital status: Married    Spouse name: Not on file  . Number of children: Not on file  . Years of education: Not on file  . Highest education level: Not on file  Social Needs  . Financial resource strain: Not on file  . Food insecurity - worry: Not on file  . Food insecurity - inability: Not on file  . Transportation needs - medical: Not on file  . Transportation needs - non-medical: Not on file  Occupational History  . Not on file  Tobacco Use  . Smoking status: Never Smoker  . Smokeless tobacco: Never Used  . Tobacco comment: Married x's 53yrs, 6 kids-scattered [employer]. Retired Consulting civil engineer  Substance and Sexual Activity  . Alcohol use: No  . Drug use: No  . Sexual activity: Not on file  Other Topics Concern  . Not on file  Social History Narrative  . Not on file    Family History:    Family History  Problem Relation Age of Onset  . Coronary artery disease Other   . Diabetes Other   . Hypertension Other   . Arthritis Other      ROS:  Please see the history of present illness.  ROS All other ROS reviewed and negative.     Physical Exam/Data:   Vitals:   11/08/17 0554 11/08/17 0605 11/08/17 0831 11/08/17 1218  BP: (!) 138/43  (!) 179/62 (!) 132/46  Pulse: 64  80 80  Resp: 18   20  Temp: (!) 97.4 F (36.3 C)   99.3 F (37.4 C)  TempSrc: Oral   Oral  SpO2: 98% 98% 100% 100%  Weight: 133 lb 2.5 oz (60.4 kg)     Height:        Intake/Output Summary (Last 24 hours) at 11/08/2017 1346 Last data filed at 11/08/2017 1012 Gross per 24 hour  Intake 360 ml  Output 2550 ml  Net -2190 ml   Filed Weights   11/07/17 1300 11/07/17 1810 11/08/17 0554  Weight: 150 lb (68 kg) 133 lb 6.1 oz (60.5 kg) 133 lb 2.5 oz (60.4 kg)   Body mass index is 22.16 kg/m.  General:  Well nourished, well developed, in no acute distress HEENT: normal Lymph: no adenopathy Neck: no JVD Endocrine:  No thryomegaly Vascular: No carotid bruits; FA pulses 2+  bilaterally without bruits  Cardiac:  normal S1, S2; 3/6 systolic murmur; no murmur  Lungs:  clear to auscultation bilaterally, no wheezing, rhonchi or rales  Abd: soft, nontender, no hepatomegaly  Ext: no edema Musculoskeletal:  No deformities, BUE and BLE strength normal and equal Skin: warm and dry  Neuro:  CNs 2-12 intact, no focal abnormalities noted Psych:  Normal affect   Telemetry:  Telemetry was personally reviewed and demonstrates:  NSR  Relevant  CV Studies: Echo 11/27/16 - Left ventricle: The cavity size was normal. Systolic function was   normal. The estimated ejection fraction was in the range of 55%   to 60%. Wall motion was normal; there were no regional wall   motion abnormalities. Features are consistent with a pseudonormal   left ventricular filling pattern, with concomitant abnormal   relaxation and increased filling pressure (grade 2 diastolic   dysfunction). Doppler parameters are consistent with high   ventricular filling pressure. Moderate concentric and severe   focal basal septal hypertrophy. - Aortic valve: Moderately calcified annulus. Trileaflet; mildly   thickened, moderately calcified leaflets. There was mild to   moderate stenosis. Peak velocity (S): 204 cm/s. Mean gradient   (S): 9 mm Hg. Valve area (VTI): 1.28 cm^2. Valve area (Vmax):   1.32 cm^2. Valve area (Vmean): 1.31 cm^2. - Left atrium: The atrium was mildly dilated. - Right ventricle: Systolic function was mildly reduced. - Atrial septum: No defect or patent foramen ovale was identified. - Tricuspid valve: There was mild regurgitation. - Pulmonary arteries: PA peak pressure: 31 mm Hg (S).  Laboratory Data:  Chemistry Recent Labs  Lab 11/07/17 0015 11/07/17 0425  NA 132* 134*  K 3.5 3.5  CL 92* 94*  CO2 28 23  GLUCOSE 155* 89  BUN 18 21*  CREATININE 3.69* 3.94*  CALCIUM 8.2* 8.1*  GFRNONAA 11* 10*  GFRAA 13* 12*  ANIONGAP 12 17*    Recent Labs  Lab 11/07/17 0015  PROT 6.9    ALBUMIN 2.9*  AST 43*  ALT 24  ALKPHOS 120  BILITOT 1.0   Hematology Recent Labs  Lab 11/07/17 0015 11/07/17 0425  WBC 7.1 6.0  RBC 2.75* 2.75*  HGB 8.0* 8.2*  HCT 26.4* 26.8*  MCV 96.0 97.5  MCH 29.1 29.8  MCHC 30.3 30.6  RDW 21.7* 22.2*  PLT 175 168   Cardiac Enzymes Recent Labs  Lab 11/07/17 0709 11/07/17 2010 11/08/17 0153 11/08/17 0256  TROPONINI 0.38* 0.43* 0.47* 0.46*    Recent Labs  Lab 11/07/17 0259  TROPIPOC 0.32*    BNP Recent Labs  Lab 11/07/17 0015  BNP 1,397.8*    Radiology/Studies:  Dg Chest 2 View  Result Date: 11/07/2017 CLINICAL DATA:  Dyspnea with cough times 1-1/2 months. EXAM: CHEST  2 VIEW COMPARISON:  08/17/2017 FINDINGS: Stable cardiomegaly with aortic atherosclerosis. Diffuse interval increase in pulmonary edema since prior with trace bilateral pleural effusions blunting the costophrenic angles. Degenerative change noted about the [facility] and glenohumeral joints as well as dorsal spine. Cholecystectomy clips are present. IMPRESSION: Stable cardiomegaly with aortic atherosclerosis. Diffuse interstitial edema consistent CHF. Trace bilateral pleural effusions blunting the costophrenic angles. Electronically Signed   By: Ashley Royalty M.D.   On: 11/07/2017 03:08   Dg Lumbar Spine 2-3 Views  Result Date: 11/07/2017 CLINICAL DATA:  Low back pain. EXAM: LUMBAR SPINE - 2-3 VIEW COMPARISON:  None FINDINGS: Normal alignment of the lumbar spine. The vertebral body heights are well preserved. Multi level ventral endplate spurring is identified with mild disc space narrowing. No acute fractures or subluxations identified. Aortic atherosclerosis noted. IMPRESSION: 1. Multi level lumbar degenerative disc disease. 2.  Aortic Atherosclerosis (ICD10-I70.0). Electronically Signed   By: Kerby Moors M.D.   On: 11/07/2017 19:58    Assessment and Plan:   1.  Chronic diastolic CHF - BNP of 1017 on presentation Currently euvoemic after emergent dialysis yesterday and  undergoing today.   2. Elevated troponin - mild elevated flat  trend. Not in ACS pattern. EKG with inferior lateral ST depression, now resolved. She does have cardiac risk factors and previously recommended stress test but never showed up.   3. Mild to moderate AS - Pending echo  4. ESRD on HD - per nephrology   5. Acute hypoxic respiratory failure 2nd to acute pulmonary edema - improved. Per primary    MD to see.   For questions or updates, please contact [place] Please consult [email or web address] for contact info under Cardiology/STEMI.   Jarrett Soho, [place]  11/08/2017 1:46 PM   History and all data above reviewed.  Patient examined.  I agree with the findings as above.  The patient presented with acute SOB.  She had acute pulmonary edema on CXR and had an elevated troponin.  She has no past history of HF or CAD.  She does have an elevated troponin with the trend being flat.  She was quite hypertensive when she presented and has a history of this.  She has ESRD on dialysis and might not have had enough fluid removed she thinks with her most recent session prior to presentation.  She denies every having any chest pain.  She does get dyspnea on occasion.   The patient exam reveals COR:RRR  ,  Lungs: Clear  ,  Abd: Positive bowel sounds, no rebound no guarding, Ext No edema  .  All available labs, radiology testing, previous records reviewed. Agree with documented assessment and plan. Elevated troponin:  Non specific in this situation.  However, she needs [facility] and we arrange this for the AM.  Acute on chronic diastolic HF:  Volume management via dialysis.  Moderate AS:  Follow clinically. Repeat echo is pending.     Jeneen Rinks Shaylyn Bawa  3:58 PM  11/08/2017

## 2017-11-08 NOTE — Progress Notes (Signed)
Awaiting on pain medication orders.  Bambie Pizzolato, RN

## 2017-11-08 NOTE — Progress Notes (Signed)
Awaiting on new orders. Repositioned patient, gave mustard and applied warm blankets to help with leg cramping.  Kayanna Mckillop, RN

## 2017-11-08 NOTE — Progress Notes (Signed)
Called Infectious disease control to see if we can discontinue enteric precautions.  Patient has not had a BM since Monday.  Per Infectious dz- ok to discontinue enteric precautions since there is not an ordered test and did not see a positive result in computer.

## 2017-11-08 NOTE — Progress Notes (Signed)
PROGRESS NOTE  [name] [name] EZM:[id number] DOB: 1941-07-27 DOA: 11/07/2017 PCP: Ann Held, DO  Brief Narrative: 66yow PMH ESRD on HD MWF presented with SOB and CP after HD. Admitted for acute hypoxia requiring BiPAP, demand ischemia, EKG changes  Assessment/Plan Acute hypox resp failure thought secondary to acute pulm edema requiring BiPAP on admission; however nephrology now questions role of volume in presentation - s/p urgent HD 1/8 with immediate improvement and transition off BiPAP - wean off oxygen today - no evidence of infection - question elevated troponin, suspect demand ischemia (effect, not cause)  Demand ischemia in patient compliant with HD, in context of hypertensive urgency, cannot exclude angina-induced by chart but patient denies every having CP and reports did not have proper volume removed at last oupt HD. - echo pending - EKGs with inferolateral ST depression now resolved - cardiology eval requested given question of volume's contribution to presentation and known compliance with HD.  N/V. - resolved, likely secondary to combination of factors above  ERSD on HD MWF - per nephrology short HD planned today then can d/c from their standpoint  DM type 2 diet-controlled - stable  Aortic atherosclerosis   DVT prophylaxis: heparin Code Status: full Family Communication: husband Disposition Plan: home    [name], MD  [facility] Direct contact: [phone] --Via amion app OR  --[email or web address]; password [email or web address]  7PM-7AM contact night coverage as above 11/08/2017, 11:04 AM  LOS: 1 day   Consultants:  Nephrology   Cardiology   Procedures:  HD  Antimicrobials:    Interval history/Subjective: Feels good, breathing fine, no CP. Denies every having chest pain.  Objective: Vitals:  Vitals:   11/08/17 0605 11/08/17 0831  BP:  (!) 179/62  Pulse:  80  Resp:    Temp:    SpO2: 98% 100%    Exam:  Constitutional:   . Appears calm and comfortable Eyes:  . pupils and irises appear normal . Normal lids  ENMT:  . grossly normal hearing  . Lips appear normal Respiratory:  . CTA bilaterally, no w/r/r.  . Respiratory effort normal.  Cardiovascular:  . RRR, no m/r/g . No LE extremity edema   Psychiatric:  . Mental status o Mood, affect appropriate  I have personally reviewed the following:   Labs:  troponins .32 >> .38 >> 0.47 >> 0.46  Hgb 8.2 1/8. Stable.  EKG 1/9 independent review: SR, no acute changes, ST changes resovled compared to 1/8  Scheduled Meds: . amLODipine  10 mg Oral Daily  . aspirin  325 mg Oral Daily  . calcitRIOL  1.5 mcg Oral Once per day on Mon Wed Fri  . carvedilol  25 mg Oral BID WC  . cyclobenzaprine  5 mg Oral Once  . feeding supplement (PRO-STAT SUGAR FREE 64)  30 mL Oral BID  . ferrous sulfate  325-650 mg Oral Q breakfast  . furosemide  40 mg Oral Daily  . heparin  5,000 Units Subcutaneous Q8H  . insulin aspart  0-9 Units Subcutaneous TID WC  . isosorbide mononitrate  30 mg Oral BID  . lisinopril  40 mg Oral QHS  . mouth rinse  15 mL Mouth Rinse BID  . multivitamin with minerals  1 tablet Oral Daily  . sucroferric oxyhydroxide  500 mg Oral TID WC   Continuous Infusions:  Active Problems:   Essential hypertension   End stage renal disease ([place])   Pulmonary edema   Acute respiratory failure with hypoxia ([place])  Nausea vomiting and diarrhea   Respiratory failure ([place])   LOS: 1 day

## 2017-11-08 NOTE — Consult Note (Signed)
   [facility] CM Inpatient Consult   11/08/2017  [name] [name] 1941/02/21 [id number]  Patient screened for potential [facility] Management services. Patient is in the [place] with [employer] of the [place] Management services under patient's Medicare plan. Met with the patient at the bedside regarding any care management needs.  Patient states she has good transportation with her husband.  She states has dialysis at [facility] on MWF schedule. Patient states she was not dialysided fully.  She states her husband helps her manage her medications and [facility] uses [facility] without any issues. Patient accepted a brochure, 24 hour nurse advise line magnet and contact information.  No needs assessed at this time.  Her primary care provider is Dr. Roma Schanz at [facility], this office is listed to provide the transition of care follow up. No current [facility] Care Management needs at this time.   For questions contact:   Natividad Brood, RN BSN [facility] Liaison  [phone] business mobile phone Toll free office [phone]

## 2017-11-08 NOTE — Progress Notes (Signed)
Paged NP K.Kirby on call again for additional pain medications, patient is complaining of leg cramping.  Braniyah Besse, RN

## 2017-11-08 NOTE — Progress Notes (Signed)
Paged NP with EKG result. Patient denies chest pain. Complains of leg pain and asking something stronger since Tylenol did not help.  Tanav Orsak, RN

## 2017-11-08 NOTE — Progress Notes (Signed)
Flexeril and and Tramadol ordered if Flexeril is not helping. Assessed pain, patient stated she is not cramping, she feels fine now 0/10. Will wait and reassess later again. NP on call checked EKG, for now, only cycle troponin's.  Will continue to monitor.  Jahquez Steffler, RN

## 2017-11-09 ENCOUNTER — Inpatient Hospital Stay (HOSPITAL_COMMUNITY): Payer: Medicare HMO

## 2017-11-09 DIAGNOSIS — J810 Acute pulmonary edema: Secondary | ICD-10-CM

## 2017-11-09 DIAGNOSIS — I361 Nonrheumatic tricuspid (valve) insufficiency: Secondary | ICD-10-CM

## 2017-11-09 LAB — ECHOCARDIOGRAM COMPLETE
Height: 65 in
Weight: 1992.96 oz

## 2017-11-09 LAB — CBC
HCT: 24.1 % — ABNORMAL LOW (ref 36.0–46.0)
HEMOGLOBIN: 7.5 g/dL — AB (ref 12.0–15.0)
MCH: 30.1 pg (ref 26.0–34.0)
MCHC: 31.1 g/dL (ref 30.0–36.0)
MCV: 96.8 fL (ref 78.0–100.0)
Platelets: 138 10*3/uL — ABNORMAL LOW (ref 150–400)
RBC: 2.49 MIL/uL — AB (ref 3.87–5.11)
RDW: 21.8 % — ABNORMAL HIGH (ref 11.5–15.5)
WBC: 3.3 10*3/uL — AB (ref 4.0–10.5)

## 2017-11-09 LAB — GLUCOSE, CAPILLARY
GLUCOSE-CAPILLARY: 99 mg/dL (ref 65–99)
Glucose-Capillary: 97 mg/dL (ref 65–99)
Glucose-Capillary: 184 mg/dL — ABNORMAL HIGH (ref 65–99)

## 2017-11-09 NOTE — Progress Notes (Signed)
  PROGRESS NOTE  [name] [name] WLS:[id number] DOB: 1940-12-21 DOA: 11/07/2017 PCP: Ann Held, DO  Brief Narrative: 11yow PMH ESRD on HD MWF presented with SOB and CP after HD. Admitted for acute hypoxia requiring BiPAP, demand ischemia, EKG changes  Assessment/Plan Acute hypox resp failure thought secondary to acute pulm edema requiring BiPAP on admission; however nephrology subsequent questioned role of volume in presentation - hypoxia resolved; resp status stable - s/p urgent HD 1/8 with immediate improvement and transition off BiPAP - no evidence of infection - question elevated troponin, suspect demand ischemia (effect, not cause)  Demand ischemia in patient compliant with HD, in context of hypertensive urgency, cannot exclude angina-induced by chart but patient denies every having CP and reports did not have proper volume removed at last oupt HD. EKGs with inferolateral ST depression now resolved - echo pending today - cardiology eval appreciated, plan for stress test later today  N/V. - resolved, likely secondary to combination of factors above  ERSD on HD MWF - stable s/p HD. Next HD 1/11.  DM type 2 diet-controlled - remains stable  Aortic atherosclerosis   DVT prophylaxis: heparin Code Status: full Family Communication: husband at bedside 1/10 Disposition Plan: home later today if NM stress negative and cleared by cardiology    [name], MD  [facility] Direct contact: [phone] --Via [employer]  --[email or web address]; password [email or web address]  7PM-7AM contact night coverage as above 11/09/2017, 10:38 AM  LOS: 2 days   Consultants:  Nephrology   Cardiology   Procedures:  HD  Antimicrobials:    Interval history/Subjective: No pain. Breathing fine. No complaints. Ate breakfast, stress test planned later today  Objective: Vitals:  Vitals:   11/09/17 0001 11/09/17 0500  BP: (!) 138/50 (!) 130/56  Pulse: 73 73  Resp: 18 18  Temp: 98  F (36.7 C) 98.2 F (36.8 C)  SpO2: 100% 98%    Exam:  Constitutional:   . Appears calm and comfortable Respiratory:  . CTA bilaterally, no w/r/r.  . Respiratory effort normal.  Cardiovascular:  . RRR, no m/r/g . Telemetry: SR Psychiatric:  . Mental status o Mood, affect appropriate  I have personally reviewed the following:   Labs:  CBG stable  Scheduled Meds: . amLODipine  10 mg Oral Daily  . aspirin  325 mg Oral Daily  . calcitRIOL  1.5 mcg Oral Once per day on Mon Wed Fri  . carvedilol  25 mg Oral BID WC  . cyclobenzaprine  5 mg Oral Once  . feeding supplement (PRO-STAT SUGAR FREE 64)  30 mL Oral BID  . ferrous sulfate  325-650 mg Oral Q breakfast  . furosemide  40 mg Oral Daily  . heparin  5,000 Units Subcutaneous Q8H  . insulin aspart  0-9 Units Subcutaneous TID WC  . isosorbide mononitrate  30 mg Oral BID  . lisinopril  40 mg Oral QHS  . mouth rinse  15 mL Mouth Rinse BID  . multivitamin with minerals  1 tablet Oral Daily  . sucroferric oxyhydroxide  500 mg Oral TID WC   Continuous Infusions:  Active Problems:   Essential hypertension   End stage renal disease ([place])   Pulmonary edema   Acute respiratory failure with hypoxia ([facility])   Nausea vomiting and diarrhea   Respiratory failure ([place])   LOS: 2 days

## 2017-11-09 NOTE — Progress Notes (Signed)
Pt educated about safety and importance of bed alarm during the night however pt refuses to be on bed alarm. Stated she will call if assistance is needed. Will continue to round on patient.   Merril Nagy, RN

## 2017-11-09 NOTE — Progress Notes (Signed)
Progress Note  Patient Name: [name] [name] Date of Encounter: 11/09/2017  Primary Cardiologist: Jenkins Rouge, MD   Subjective   Breathing back to normal. No recurrent chest pain. She had her breakfast.   Inpatient Medications    Scheduled Meds: . amLODipine  10 mg Oral Daily  . aspirin  325 mg Oral Daily  . calcitRIOL  1.5 mcg Oral Once per day on Mon Wed Fri  . carvedilol  25 mg Oral BID WC  . cyclobenzaprine  5 mg Oral Once  . feeding supplement (PRO-STAT SUGAR FREE 64)  30 mL Oral BID  . ferrous sulfate  325-650 mg Oral Q breakfast  . furosemide  40 mg Oral Daily  . heparin  5,000 Units Subcutaneous Q8H  . insulin aspart  0-9 Units Subcutaneous TID WC  . isosorbide mononitrate  30 mg Oral BID  . lisinopril  40 mg Oral QHS  . mouth rinse  15 mL Mouth Rinse BID  . multivitamin with minerals  1 tablet Oral Daily  . sucroferric oxyhydroxide  500 mg Oral TID WC   Continuous Infusions:  PRN Meds: acetaminophen **OR** acetaminophen, labetalol, ondansetron **OR** ondansetron (ZOFRAN) IV, traMADol   Vital Signs    Vitals:   11/08/17 1958 11/08/17 2149 11/09/17 0001 11/09/17 0500  BP: (!) 117/46 (!) 129/54 (!) 138/50 (!) 130/56  Pulse: 75 75 73 73  Resp: 18  18 18   Temp: 98.2 F (36.8 C)  98 F (36.7 C) 98.2 F (36.8 C)  TempSrc: Oral  Oral Oral  SpO2: 98% 100% 100% 98%  Weight:    124 lb 9 oz (56.5 kg)  Height:        Intake/Output Summary (Last 24 hours) at 11/09/2017 0902 Last data filed at 11/09/2017 1191 Gross per 24 hour  Intake 600 ml  Output 818 ml  Net -218 ml   Filed Weights   11/08/17 1330 11/08/17 1607 11/09/17 0500  Weight: 135 lb 12.9 oz (61.6 kg) 127 lb 6.8 oz (57.8 kg) 124 lb 9 oz (56.5 kg)    Telemetry    NSR - Personally Reviewed  ECG    N/A  Physical Exam   GEN: No acute distress.   Neck: No JVD Cardiac: RRR, + systolic murmurs, rubs, or gallops.  Respiratory: Clear to auscultation bilaterally. GI: Soft, nontender,  non-distended  MS: No edema; No deformity. Neuro:  Nonfocal  Psych: Normal affect   Labs    Chemistry Recent Labs  Lab 11/07/17 0015 11/07/17 0425  NA 132* 134*  K 3.5 3.5  CL 92* 94*  CO2 28 23  GLUCOSE 155* 89  BUN 18 21*  CREATININE 3.69* 3.94*  CALCIUM 8.2* 8.1*  PROT 6.9  --   ALBUMIN 2.9*  --   AST 43*  --   ALT 24  --   ALKPHOS 120  --   BILITOT 1.0  --   GFRNONAA 11* 10*  GFRAA 13* 12*  ANIONGAP 12 17*     Hematology Recent Labs  Lab 11/07/17 0015 11/07/17 0425  WBC 7.1 6.0  RBC 2.75* 2.75*  HGB 8.0* 8.2*  HCT 26.4* 26.8*  MCV 96.0 97.5  MCH 29.1 29.8  MCHC 30.3 30.6  RDW 21.7* 22.2*  PLT 175 168    Cardiac Enzymes Recent Labs  Lab 11/07/17 0709 11/07/17 2010 11/08/17 0153 11/08/17 0256  TROPONINI 0.38* 0.43* 0.47* 0.46*    Recent Labs  Lab 11/07/17 0259  TROPIPOC 0.32*     BNP  Recent Labs  Lab 11/07/17 0015  BNP 1,397.8*     DDimer No results for input(s): DDIMER in the last 168 hours.   Radiology    Dg Lumbar Spine 2-3 Views  Result Date: 11/07/2017 CLINICAL DATA:  Low back pain. EXAM: LUMBAR SPINE - 2-3 VIEW COMPARISON:  None FINDINGS: Normal alignment of the lumbar spine. The vertebral body heights are well preserved. Multi level ventral endplate spurring is identified with mild disc space narrowing. No acute fractures or subluxations identified. Aortic atherosclerosis noted. IMPRESSION: 1. Multi level lumbar degenerative disc disease. 2.  Aortic Atherosclerosis (ICD10-I70.0). Electronically Signed   By: Kerby Moors M.D.   On: 11/07/2017 19:58    Cardiac Studies   Pending echo and stress test   Patient Profile     77 y.o. female  with a hx of DM, HTN, HLD, ESRD on HD (MWF), chronic diastolic CHF and aortic stenosis admitted for volume overload and chest pain due to not enough fluid removal by dialysis prior to presentation.  EKG with inferior lateral ST depression, now resolved.   Assessment & Plan    1. Acute on  chronic diastolic CHF - improved after emergent dialysis. Euvolemic. Volume management by HD.   2. Elevated troponin/chest pain - No in ACE pattern. She ate breakfast prior to her stress test. Will be schedule for tomorrow. She did not show up for outpatient testing in past.   3. Mild to moderate AS - pending echo   For questions or updates, please contact [facility] Please consult [email or web address] for contact info under Cardiology/STEMI.      Signed, Leanor Kail, PA  11/09/2017, 9:02 AM    History and all data above reviewed.  Patient examined.  I agree with the findings as above. No chest pain.  No SOB.   The patient exam reveals COR:RRR, systolic murmur unchanged  ,  Lungs: Clear  ,  Abd: Positive bowel sounds, no rebound no guarding, Ext No edema  .  All available labs, radiology testing, previous records reviewed. Agree with documented assessment and plan. Elevated troponin:  Needs stress testing before discharge.  Acute diastolic HF:  Volume managed on dialysis.    Minus Breeding  10:53 AM  11/09/2017

## 2017-11-09 NOTE — Progress Notes (Signed)
[facility] Progress Note   Subjective:  Seen in room, looks much better. C/o dark stool last night, will check CBC today. For echo and stress testing today.  Objective Vitals:   11/08/17 1958 11/08/17 2149 11/09/17 0001 11/09/17 0500  BP: (!) 117/46 (!) 129/54 (!) 138/50 (!) 130/56  Pulse: 75 75 73 73  Resp: 18  18 18   Temp: 98.2 F (36.8 C)  98 F (36.7 C) 98.2 F (36.8 C)  TempSrc: Oral  Oral Oral  SpO2: 98% 100% 100% 98%  Weight:    56.5 kg (124 lb 9 oz)  Height:       Physical Exam General: Well appearing, NAD. On room air. Heart: RRR Lungs: CTAB Extremities: No LE edema Dialysis Access: AVG  Additional Objective Labs: Basic Metabolic Panel: Recent Labs  Lab 11/07/17 0015 11/07/17 0425  NA 132* 134*  K 3.5 3.5  CL 92* 94*  CO2 28 23  GLUCOSE 155* 89  BUN 18 21*  CREATININE 3.69* 3.94*  CALCIUM 8.2* 8.1*   Liver Function Tests: Recent Labs  Lab 11/07/17 0015  AST 43*  ALT 24  ALKPHOS 120  BILITOT 1.0  PROT 6.9  ALBUMIN 2.9*   No results for input(s): LIPASE, AMYLASE in the last 168 hours. CBC: Recent Labs  Lab 11/07/17 0015 11/07/17 0425  WBC 7.1 6.0  NEUTROABS 6.0  --   HGB 8.0* 8.2*  HCT 26.4* 26.8*  MCV 96.0 97.5  PLT 175 168   Blood Culture    Component Value Date/Time   SDES BLOOD LEFT FOREARM  4 ML IN EACH BOTTLE 05/17/2014 1657   SDES BLOOD RIGHT FOREARM  4 ML IN [facility] BOTTLE 05/17/2014 1657   SPECREQUEST Normal 05/17/2014 1657   SPECREQUEST Normal 05/17/2014 1657   CULT  05/17/2014 1657    NO GROWTH 5 DAYS Performed at [place]  05/17/2014 1657    NO GROWTH 5 DAYS Performed at [place] 05/23/2014 FINAL 05/17/2014 1657   REPTSTATUS 05/23/2014 FINAL 05/17/2014 1657    Cardiac Enzymes: Recent Labs  Lab 11/07/17 0709 11/07/17 2010 11/08/17 0153 11/08/17 0256  TROPONINI 0.38* 0.43* 0.47* 0.46*   CBG: Recent Labs  Lab 11/07/17 2127 11/08/17 0739  11/08/17 1113 11/08/17 1719 11/08/17 2101  GLUCAP 145* 96 162* 116* 147*   Iron Studies: No results for input(s): IRON, TIBC, TRANSFERRIN, FERRITIN in the last 72 hours. @lablastinr3 @ Studies/Results: Dg Lumbar Spine 2-3 Views  Result Date: 11/07/2017 CLINICAL DATA:  Low back pain. EXAM: LUMBAR SPINE - 2-3 VIEW COMPARISON:  None FINDINGS: Normal alignment of the lumbar spine. The vertebral body heights are well preserved. Multi level ventral endplate spurring is identified with mild disc space narrowing. No acute fractures or subluxations identified. Aortic atherosclerosis noted. IMPRESSION: 1. Multi level lumbar degenerative disc disease. 2.  Aortic Atherosclerosis (ICD10-I70.0). Electronically Signed   By: Kerby Moors M.D.   On: 11/07/2017 19:58   Medications:  . amLODipine  10 mg Oral Daily  . aspirin  325 mg Oral Daily  . calcitRIOL  1.5 mcg Oral Once per day on Mon Wed Fri  . carvedilol  25 mg Oral BID WC  . cyclobenzaprine  5 mg Oral Once  . feeding supplement (PRO-STAT SUGAR FREE 64)  30 mL Oral BID  . ferrous sulfate  325-650 mg Oral Q breakfast  . furosemide  40 mg Oral Daily  . heparin  5,000 Units Subcutaneous Q8H  . insulin  aspart  0-9 Units Subcutaneous TID WC  . isosorbide mononitrate  30 mg Oral BID  . lisinopril  40 mg Oral QHS  . mouth rinse  15 mL Mouth Rinse BID  . multivitamin with minerals  1 tablet Oral Daily  . sucroferric oxyhydroxide  500 mg Oral TID WC    Dialysis Orders: MWF at [facility] 3:30h, 400/800, EDW 60.5kg, 2K/2Ca, AVG, heparin 5000 bolus - Mircera 270mcg IV q 2 weeks (last 1/2) - Calcitriol 1.42mcg PO q HD - Venofer 100mg  x 10 ordered (s/p 3 of 10 so far) - Binders: Velphoro 1/meals  Assessment/Plan: 1. Dyspnea/volume overload: On bi-pap on admit, 2.5L UF removed, symptoms resolved and came off bipap quickly, + cramping all night as if she was dry. Suspect volume was not as big of issues as initially suspected. 2. ESRD:S/p HD  1/8 and 1/9, next 1/11. 3. Hypertension/volume:BP high with pulm edema. S/p 2L yesterday, back to EDW. 4. Anemia:Hgb 8.2. ESA just given. Repeat CBC today. 5. Metabolic bone disease:Corr Ca ok, Phos pending. Continue binders/VDRA. 6. Nutrition:Alb low, adding supps. 7. DM:Per primary. 8. Elevated troponin:Echo and stress test pending, per cardiology. 9. "Dark" stool last night: Check CBC/guaiac today.  Veneta Penton, PA-C 11/09/2017, 9:31 AM  [facility] Pager: [phone]  Pt seen, examined and agree w A/P as above.  Kelly Splinter MD [employer] pager [phone]   11/09/2017, 10:59 AM

## 2017-11-09 NOTE — Progress Notes (Signed)
  Echocardiogram 2D Echocardiogram has been performed.  [name] [name] 11/09/2017, 4:33 PM

## 2017-11-10 ENCOUNTER — Inpatient Hospital Stay (HOSPITAL_COMMUNITY): Payer: Medicare HMO

## 2017-11-10 DIAGNOSIS — R079 Chest pain, unspecified: Secondary | ICD-10-CM

## 2017-11-10 DIAGNOSIS — D649 Anemia, unspecified: Secondary | ICD-10-CM

## 2017-11-10 DIAGNOSIS — D638 Anemia in other chronic diseases classified elsewhere: Secondary | ICD-10-CM

## 2017-11-10 DIAGNOSIS — E8770 Fluid overload, unspecified: Secondary | ICD-10-CM

## 2017-11-10 DIAGNOSIS — I700 Atherosclerosis of aorta: Secondary | ICD-10-CM

## 2017-11-10 DIAGNOSIS — E1121 Type 2 diabetes mellitus with diabetic nephropathy: Secondary | ICD-10-CM

## 2017-11-10 DIAGNOSIS — R195 Other fecal abnormalities: Secondary | ICD-10-CM

## 2017-11-10 DIAGNOSIS — I248 Other forms of acute ischemic heart disease: Secondary | ICD-10-CM

## 2017-11-10 LAB — RENAL FUNCTION PANEL
ALBUMIN: 2.5 g/dL — AB (ref 3.5–5.0)
Anion gap: 10 (ref 5–15)
BUN: 52 mg/dL — AB (ref 6–20)
CALCIUM: 9.1 mg/dL (ref 8.9–10.3)
CO2: 28 mmol/L (ref 22–32)
CREATININE: 5.27 mg/dL — AB (ref 0.44–1.00)
Chloride: 98 mmol/L — ABNORMAL LOW (ref 101–111)
GFR, EST AFRICAN AMERICAN: 8 mL/min — AB (ref >60)
GFR, EST NON AFRICAN AMERICAN: 7 mL/min — AB (ref >60)
Glucose, Bld: 83 mg/dL (ref 65–99)
Phosphorus: 4.3 mg/dL (ref 2.5–4.6)
Potassium: 4.4 mmol/L (ref 3.5–5.1)
SODIUM: 136 mmol/L (ref 135–145)

## 2017-11-10 LAB — CBC
HCT: 23.7 % — ABNORMAL LOW (ref 36.0–46.0)
Hemoglobin: 7.1 g/dL — ABNORMAL LOW (ref 12.0–15.0)
MCH: 29.1 pg (ref 26.0–34.0)
MCHC: 30 g/dL (ref 30.0–36.0)
MCV: 97.1 fL (ref 78.0–100.0)
PLATELETS: 125 10*3/uL — AB (ref 150–400)
RBC: 2.44 MIL/uL — ABNORMAL LOW (ref 3.87–5.11)
RDW: 21.3 % — AB (ref 11.5–15.5)
WBC: 2.4 10*3/uL — AB (ref 4.0–10.5)

## 2017-11-10 LAB — GLUCOSE, CAPILLARY
GLUCOSE-CAPILLARY: 90 mg/dL (ref 65–99)
Glucose-Capillary: 90 mg/dL (ref 65–99)

## 2017-11-10 LAB — NM MYOCAR MULTI W/SPECT W/WALL MOTION / EF
CHL CUP RESTING HR STRESS: 64 {beats}/min
CSEPEW: 1 METS
CSEPHR: 62 %
Exercise duration (min): 5 min
MPHR: 144 {beats}/min
Peak HR: 90 {beats}/min

## 2017-11-10 LAB — OCCULT BLOOD X 1 CARD TO LAB, STOOL: FECAL OCCULT BLD: POSITIVE — AB

## 2017-11-10 MED ORDER — HYDROCORTISONE ACETATE 25 MG RE SUPP
25.0000 mg | Freq: Two times a day (BID) | RECTAL | Status: DC
  Filled 2017-11-10 (×3): qty 1

## 2017-11-10 MED ORDER — REGADENOSON 0.4 MG/5ML IV SOLN
INTRAVENOUS | Status: AC
  Administered 2017-11-10: 0.4 mg via INTRAVENOUS
  Filled 2017-11-10: qty 5

## 2017-11-10 MED ORDER — TECHNETIUM TC 99M TETROFOSMIN IV KIT
10.0000 | PACK | Freq: Once | INTRAVENOUS | Status: AC | PRN
  Administered 2017-11-10: 10 via INTRAVENOUS

## 2017-11-10 MED ORDER — TECHNETIUM TC 99M TETROFOSMIN IV KIT
30.0000 | PACK | Freq: Once | INTRAVENOUS | Status: AC | PRN
  Administered 2017-11-10: 30 via INTRAVENOUS
  Filled 2017-11-10: qty 30

## 2017-11-10 MED ORDER — REGADENOSON 0.4 MG/5ML IV SOLN
0.4000 mg | Freq: Once | INTRAVENOUS | Status: AC
  Administered 2017-11-10: 0.4 mg via INTRAVENOUS
  Filled 2017-11-10: qty 5

## 2017-11-10 NOTE — Progress Notes (Signed)
PA paged for diet order.

## 2017-11-10 NOTE — Progress Notes (Signed)
Patient had 6 beats of wide QRS, patient asleep. Paged NP on call [place].   Will continue to monitor.  Djuana Littleton, RN

## 2017-11-10 NOTE — Progress Notes (Signed)
Fecal occult blood possitive. No bleeding noted at this time. Paged NP on call for [facility] and informed him. Will continue to monitor.  Eliyana Pagliaro, RN

## 2017-11-10 NOTE — Progress Notes (Signed)
[facility] Progress Note   Subjective:  No c/o, for stress test today  Objective Vitals:   11/10/17 0421 11/10/17 1044 11/10/17 1045 11/10/17 1047  BP: (!) 146/50 (!) 118/50 (!) 126/48 (!) 141/51  Pulse: 73     Resp: 18     Temp: 98 F (36.7 C)     TempSrc: Oral     SpO2: 97%     Weight: 58.7 kg (129 lb 8 oz)     Height:       Physical Exam General: Well appearing, NAD. On room air. Heart: RRR Lungs: CTAB Extremities: No LE edema Dialysis Access: AVG  Additional Objective Labs: Basic Metabolic Panel: Recent Labs  Lab 11/07/17 0015 11/07/17 0425 11/10/17 0641  NA 132* 134* 136  K 3.5 3.5 4.4  CL 92* 94* 98*  CO2 28 23 28   GLUCOSE 155* 89 83  BUN 18 21* 52*  CREATININE 3.69* 3.94* 5.27*  CALCIUM 8.2* 8.1* 9.1  PHOS  --   --  4.3   Liver Function Tests: Recent Labs  Lab 11/07/17 0015 11/10/17 0641  AST 43*  --   ALT 24  --   ALKPHOS 120  --   BILITOT 1.0  --   PROT 6.9  --   ALBUMIN 2.9* 2.5*   No results for input(s): LIPASE, AMYLASE in the last 168 hours. CBC: Recent Labs  Lab 11/07/17 0015 11/07/17 0425 11/09/17 1124 11/10/17 0641  WBC 7.1 6.0 3.3* 2.4*  NEUTROABS 6.0  --   --   --   HGB 8.0* 8.2* 7.5* 7.1*  HCT 26.4* 26.8* 24.1* 23.7*  MCV 96.0 97.5 96.8 97.1  PLT 175 168 138* 125*   Blood Culture    Component Value Date/Time   SDES BLOOD LEFT FOREARM  4 ML IN EACH BOTTLE 05/17/2014 1657   SDES BLOOD RIGHT FOREARM  4 ML IN [facility] BOTTLE 05/17/2014 1657   SPECREQUEST Normal 05/17/2014 1657   SPECREQUEST Normal 05/17/2014 1657   CULT  05/17/2014 1657    NO GROWTH 5 DAYS Performed at [place]  05/17/2014 1657    NO GROWTH 5 DAYS Performed at [place] 05/23/2014 FINAL 05/17/2014 1657   REPTSTATUS 05/23/2014 FINAL 05/17/2014 1657    Cardiac Enzymes: Recent Labs  Lab 11/07/17 0709 11/07/17 2010 11/08/17 0153 11/08/17 0256  TROPONINI 0.38* 0.43* 0.47* 0.46*    CBG: Recent Labs  Lab 11/08/17 2101 11/09/17 1209 11/09/17 1644 11/09/17 2215 11/10/17 0745  GLUCAP 147* 99 97 184* 90   Iron Studies: No results for input(s): IRON, TIBC, TRANSFERRIN, FERRITIN in the last 72 hours. @lablastinr3 @ Studies/Results: No results found. Medications:  . amLODipine  10 mg Oral Daily  . aspirin  325 mg Oral Daily  . calcitRIOL  1.5 mcg Oral Once per day on Mon Wed Fri  . carvedilol  25 mg Oral BID WC  . cyclobenzaprine  5 mg Oral Once  . feeding supplement (PRO-STAT SUGAR FREE 64)  30 mL Oral BID  . ferrous sulfate  325-650 mg Oral Q breakfast  . furosemide  40 mg Oral Daily  . heparin  5,000 Units Subcutaneous Q8H  . insulin aspart  0-9 Units Subcutaneous TID WC  . isosorbide mononitrate  30 mg Oral BID  . lisinopril  40 mg Oral QHS  . mouth rinse  15 mL Mouth Rinse BID  . multivitamin with minerals  1 tablet Oral Daily  . sucroferric oxyhydroxide  500 mg Oral TID WC    Dialysis Orders: MWF at [facility] 3:30h, 400/800, EDW 60.5kg, 2K/2Ca, AVG, heparin 5000 bolus - Mircera 220mcg IV q 2 weeks (last 1/2) - Calcitriol 1.41mcg PO q HD - Venofer 100mg  x 10 ordered (s/p 3 of 10 so far) - Binders: Velphoro 1/meals  Assessment: 1. Dyspnea/volume overload/pulm edema: On bi-pap on admit, now resolved. Vol excess and/or ischemia. Getting cardiac w/u now.  2.  ESRD:S/p HD 1/8 and 1/9, HD today.  3. Hypertension/volume:BP high with pulm edema. S/p 2L yesterday, back to EDW.  4. Anemia of :Hgb 8.2. ESA just given. Repeat CBC today. 5. Metabolic bone disease:Corr Ca ok, Phos pending. Continue binders/VDRA. 6. Nutrition:Alb low, adding supps. 7. DM:Per primary. 8.  Elevated trop: stress test pending, per cards.   Plan - as above   Kelly Splinter MD [employer] pager [phone]   11/10/2017, 11:00 AM

## 2017-11-10 NOTE — Progress Notes (Signed)
Dialysis treatment terminated per pt with 1 hour remaining.  Nephrology notified and AMA form signed in chart.  875 mL ultrafiltrated and net fluid removal 375 mL.    Patient status unchanged. Lung sounds diminished to ausculation in all fields. No edema. Cardiac: NSR.  Disconnected lines and removed needles.  Pressure held for 10 minutes and band aid/gauze dressing applied.  Report given to bedside RN, Lamelva.

## 2017-11-10 NOTE — Progress Notes (Signed)
   Nuclear stress test completed. Results pending. [facility] to read. We will f/u on results.   Lyda Jester, PA-C 11/10/2017

## 2017-11-10 NOTE — Consult Note (Signed)
[facility] Consult: 11:37 AM 11/10/2017  LOS: 3 days    Referring Provider: Dr Sarajane Jews  Primary Care Physician:  Carollee Herter, Alferd Apa, DO Primary Gastroenterologist:  Dr. Carlean Purl     Reason for Consultation:  Anemia, FOBT +   HPI: [name] [name] is a 77 y.o. female.  Hx IDDM.  HTN.  ESRD, MWF dialysis.  Anemia of chronic kidney disease.  On oral iron, twice monthly Mircera, Venofer treatment in progress.  Diastolic heart failure, aortic stenosis.  S/p cholecystectomy 2008.  Chronic thrombocytopenia. 12/2006 Colonoscopy.  Screening study.  + melanosis coli.  + int/external hemorrhoids.  + diverticulosis.  No polyps.    Admitted with chest pain, elevated troponins, volume overload, hypoxic resp failure.  Treated, improved with bipap and dialysis.  Cardiac nuc stress study this AM.    Hgb 6.6, MCV 74 on 08/19/17 during an admission.  Patient was transfused with 2 units PRBCs.  She was FOBT positive but this was never investigated.   During the course of this admission her Hgb has gone from 8.2 to 7.1.  Platelets are 120 5K.  Patient periodically has diarrhea interspersed with hard, constipated stools.  Stool for C. difficile was negative on 08/18/17.  About a month ago when she was having a loose stool, she saw a small to moderate amount of blood in the stool.  This did not persist.  Yesterday her stool was brown, hard, formed.  Today, after her cardiac nuclear study, she had a loose stool and saw blood in it. She denies abdominal pain.  She vomited once within 24 hours prior to current admission but nausea did not persist and she has not vomited since.  The emesis was nonbloody.  Normally she has a great appetite and eats well.  However she knows she is lost weight as her clothes are fitting looser and people  have commented that she is lost weight.   Past Medical History:  Diagnosis Date  . DEPRESSION   . DIABETES MELLITUS, TYPE I   . GERD   . GLAUCOMA   . Headache(784.0)   . Hepatitis B carrier ([place])    05/2009: neg Hep C; Hep B: core pos, Surf neg; fatty liver US 8/10 - 7/13  . HYPERLIPIDEMIA   . HYPERTENSION   . Kidney failure    dialysis 3 times per week  . OSTEOPENIA   . POSTMENOPAUSAL STATUS   . Pulmonary edema   . Unspecified vitamin D deficiency     Past Surgical History:  Procedure Laterality Date  . AV FISTULA PLACEMENT Left 05/29/2014   Procedure: INSERTION OF ARTERIOVENOUS (AV) GORE-TEX GRAFT ARM-LEFT;  Surgeon: Angelia Mould, MD;  Location: [place];  Service: Vascular;  Laterality: Left;  . CHOLECYSTECTOMY  02/12/07  . INSERTION OF DIALYSIS CATHETER Right 05/27/2014   Procedure: INSERTION OF DIALYSIS CATHETER;  Surgeon: Angelia Mould, MD;  Location: [place];  Service: Vascular;  Laterality: Right;  . REFRACTIVE SURGERY  07/29/09   Dr. Bing Plume  . TONSILLECTOMY      Prior to  Admission medications   Medication Sig Start Date End Date Taking? Authorizing Provider  acetaminophen (TYLENOL) 325 MG tablet Take 650 mg by mouth every 6 (six) hours as needed for moderate pain.    Yes [provider]  amLODipine (NORVASC) 10 MG tablet TAKE 1 TABLET BY MOUTH EVERY DAY 12/30/15  Yes Lowne Chase, Yvonne R, DO  carvedilol (COREG) 25 MG tablet TAKE 1 TABLET (25 MG TOTAL) BY MOUTH 2 (TWO) TIMES DAILY WITH A MEAL. 09/19/17  Yes Roma Schanz R, DO  ferrous sulfate 325 (65 FE) MG tablet Take 325-650 mg by mouth daily with breakfast.   Yes [provider]  furosemide (LASIX) 40 MG tablet TAKE 1 TABLET BY MOUTH EVERY DAY 09/19/17  Yes Roma Schanz R, DO  isosorbide mononitrate (IMDUR) 30 MG 24 hr tablet Take 1 tablet (30 mg total) by mouth 2 (two) times daily. 07/07/17  Yes Roma Schanz R, DO  lisinopril (PRINIVIL,ZESTRIL) 40 MG tablet Take 1 tablet  (40 mg total) by mouth at bedtime. 09/27/17  Yes Ann Held, DO  Multiple Vitamin (MULTIVITAMIN WITH MINERALS) TABS tablet Take 2 tablets by mouth daily.   Yes [provider]    Scheduled Meds: . amLODipine  10 mg Oral Daily  . aspirin  325 mg Oral Daily  . calcitRIOL  1.5 mcg Oral Once per day on Mon Wed Fri  . carvedilol  25 mg Oral BID WC  . cyclobenzaprine  5 mg Oral Once  . feeding supplement (PRO-STAT SUGAR FREE 64)  30 mL Oral BID  . ferrous sulfate  325-650 mg Oral Q breakfast  . furosemide  40 mg Oral Daily  . heparin  5,000 Units Subcutaneous Q8H  . insulin aspart  0-9 Units Subcutaneous TID WC  . isosorbide mononitrate  30 mg Oral BID  . lisinopril  40 mg Oral QHS  . mouth rinse  15 mL Mouth Rinse BID  . multivitamin with minerals  1 tablet Oral Daily  . sucroferric oxyhydroxide  500 mg Oral TID WC   Infusions:  PRN Meds: acetaminophen **OR** acetaminophen, labetalol, ondansetron **OR** ondansetron (ZOFRAN) IV, traMADol   Allergies as of 11/06/2017 - Review Complete 10/26/2017  Allergen Reaction Noted  . Hydralazine Itching 01/27/2014  . Robaxin [methocarbamol]  01/27/2014    Family History  Problem Relation Age of Onset  . Coronary artery disease Other   . Diabetes Other   . Hypertension Other   . Arthritis Other     Social History   Socioeconomic History  . Marital status: Married    Spouse name: Not on file  . Number of children: Not on file  . Years of education: Not on file  . Highest education level: Not on file  Social Needs  . Financial resource strain: Not on file  . Food insecurity - worry: Not on file  . Food insecurity - inability: Not on file  . Transportation needs - medical: Not on file  . Transportation needs - non-medical: Not on file  Occupational History  . Not on file  Tobacco Use  . Smoking status: Never Smoker  . Smokeless tobacco: Never Used  . Tobacco comment: Married x's 84yrs, 6 kids-scattered [employer]. Retired Consulting civil engineer  Substance and Sexual Activity  . Alcohol use: No  . Drug use: No  . Sexual activity: Not on file  Other Topics Concern  . Not on file  Social History Narrative  . Not on file  REVIEW OF SYSTEMS: Constitutional: Patient feels good now but at arrival she was very weak. ENT:  No nose bleeds Pulm: Shortness of breath resolved.  No cough. CV:  No palpitations, no LE edema.  No chest pain. GU: Still makes a normal amount of urine. GI:  Per HPI Heme: Other than the 2 incidences of rectal bleeding, she does not have any unusual bleeding or bruising tendencies. Transfusions:  Per HPI Neuro:  No headaches, no peripheral tingling or numbness Derm:  No itching, no rash or sores.  Endocrine:  No sweats or chills.  No polyuria or dysuria Immunization: Reviewed her vaccination schedule.  I do not see a documented flu shot for 2018 Travel:  None beyond local counties in last few months.    PHYSICAL EXAM: Vital signs in last 24 hours: Vitals:   11/10/17 1045 11/10/17 1047  BP: (!) 126/48 (!) 141/51  Pulse:    Resp:    Temp:    SpO2:     Wt Readings from Last 3 Encounters:  11/10/17 58.7 kg (129 lb 8 oz)  10/20/17 68 kg (150 lb)  08/20/17 59.1 kg (130 lb 4.7 oz)    General: Pleasant, comfortable, non-ill appearing AAF Head: No facial asymmetry or swelling.  No signs of head trauma. Eyes: No scleral icterus.  No conjunctival pallor.  EOMI. Ears: Not hard of hearing Nose: No congestion or discharge. Mouth: Oropharynx moist, pink, clear.  Tongue midline. Neck: No JVD, no masses.  No thyromegaly. Lungs: Clear bilaterally.  No labored breathing or cough. Heart: RRR.  + syst murmer.   Abdomen: Soft.  Nontender.  Not distended.  No masses, hernias, HSM, bruits..   Rectal: Palpable internal hemorrhoids.  No blood or stool on exam glove.  No tenderness.  No masses other than the hemorrhoids. Musc/Skeltl: No joint contracture deformities or  swelling. Extremities: Palpable thrill on AV graft of left arm. Neurologic: Alert.  Oriented x3.  A little bit forgetful but not confused.'s.  No tremor. Skin: No suspicious lesions.  No rashes or sores. Psych: Cooperative.  Calm.  In good spirits.  Intake/Output from previous day: 01/10 0701 - 01/11 0700 In: 600 [P.O.:600] Out: 50 [Urine:50] Intake/Output this shift: No intake/output data recorded.  LAB RESULTS: Recent Labs    11/09/17 1124 11/10/17 0641  WBC 3.3* 2.4*  HGB 7.5* 7.1*  HCT 24.1* 23.7*  PLT 138* 125*   BMET Lab Results  Component Value Date   NA 136 11/10/2017   NA 134 (L) 11/07/2017   NA 132 (L) 11/07/2017   K 4.4 11/10/2017   K 3.5 11/07/2017   K 3.5 11/07/2017   CL 98 (L) 11/10/2017   CL 94 (L) 11/07/2017   CL 92 (L) 11/07/2017   CO2 28 11/10/2017   CO2 23 11/07/2017   CO2 28 11/07/2017   GLUCOSE 83 11/10/2017   GLUCOSE 89 11/07/2017   GLUCOSE 155 (H) 11/07/2017   BUN 52 (H) 11/10/2017   BUN 21 (H) 11/07/2017   BUN 18 11/07/2017   CREATININE 5.27 (H) 11/10/2017   CREATININE 3.94 (H) 11/07/2017   CREATININE 3.69 (H) 11/07/2017   CALCIUM 9.1 11/10/2017   CALCIUM 8.1 (L) 11/07/2017   CALCIUM 8.2 (L) 11/07/2017   LFT Recent Labs    11/10/17 0641  ALBUMIN 2.5*   PT/INR Lab Results  Component Value Date   INR 1.05 07/20/2016   INR 1.09 05/20/2014   INR 1.05 05/17/2014   Hepatitis Panel Recent Labs    11/07/17  1243  HEPBSAG Negative     RADIOLOGY STUDIES: No results found.    IMPRESSION:   *  FOBT + acute on chronic anemia.  ? AVMs, ? Portal hypertensive gastropathy, ? Silent ulcers?  Blood PR a month or so ago and today, likely from hemorrhoids palpated on exam today  *  Cirrhosis of liver?  The most recent, but older, ultrasound of 04/2014 showed what look like cirrhotic changes in the liver.  Given that she has thrombocytopenia would not doubt that she may have progressed to full on cirrhosis.  However her coags as of  07/2016 were well within normal limits  *  ESRD.  MWF dialysis  *  Volume overload, elevated Troponins.  Nuc med stress test this AM, results pending.  Dr Percival Spanish following.      PLAN:     *  Defer to Dr. Henrene Pastor regarding the need for endoscopic evaluation.   *  Anusol HC PR.    *  Will order ultrasound abdomen to assess for cirrhosis and ascites, though exam does not suggest ascites.  Does not need to be n.p.o. for this study.  *  I see that she has been n.p.o. since midnight, was this because of the nuclear study?.  From a GI standpoint she does not need to be n.p.o.  Azucena Freed  11/10/2017, 11:37 AM Pager: [phone]  GI ATTENDING  History, laboratories, remote endoscopy reports reviewed. Patient currently in dialysis. Agree with comprehensive consultation note as outlined above. This is an extremely complicated 77 year old female with multiple advanced medical problems. We're asked to see for anemia and Hemoccult-positive stool as well as a previous report of minor rectal bleeding with the passage of a hard stool. Her anemia is chronic and multifactorial. Multiple possibilities for heme positive stool. Previous colonoscopy unremarkable. No acute or subacute bleeding at this time. At this point, I favor supportive care as I feel that she is too high risk for endoscopy. Transfuse to desired hemoglobin. Empiric PPI. Anusol suppositories for probable transient hemorrhoidal bleed. Continue treatment of her acute and subacute medical problems per primary service. We will follow to assure that she remains stable from a GI perspective. If she were to develop more overt bleeding then endoscopy for the purposes of possible therapy would be considered. Thank you.  [name] [name]., M.D. [facility] Division of Gastroenterology

## 2017-11-10 NOTE — Progress Notes (Signed)
Progress Note  Patient Name: [name] [name] Date of Encounter: 11/10/2017  Primary Cardiologist: Jenkins Rouge, MD   Subjective   No chest pain.  No SOB.   Inpatient Medications    Scheduled Meds: . regadenoson      . amLODipine  10 mg Oral Daily  . aspirin  325 mg Oral Daily  . calcitRIOL  1.5 mcg Oral Once per day on Mon Wed Fri  . carvedilol  25 mg Oral BID WC  . cyclobenzaprine  5 mg Oral Once  . feeding supplement (PRO-STAT SUGAR FREE 64)  30 mL Oral BID  . ferrous sulfate  325-650 mg Oral Q breakfast  . furosemide  40 mg Oral Daily  . heparin  5,000 Units Subcutaneous Q8H  . insulin aspart  0-9 Units Subcutaneous TID WC  . isosorbide mononitrate  30 mg Oral BID  . lisinopril  40 mg Oral QHS  . mouth rinse  15 mL Mouth Rinse BID  . multivitamin with minerals  1 tablet Oral Daily  . regadenoson  0.4 mg Intravenous Once  . sucroferric oxyhydroxide  500 mg Oral TID WC   Continuous Infusions:  PRN Meds: acetaminophen **OR** acetaminophen, labetalol, ondansetron **OR** ondansetron (ZOFRAN) IV, traMADol   Vital Signs    Vitals:   11/09/17 1651 11/09/17 2020 11/09/17 2155 11/10/17 0421  BP: 128/60 (!) 130/40 (!) 102/39 (!) 146/50  Pulse: 78 66 62 73  Resp: 18 18  18   Temp: 98 F (36.7 C) 97.8 F (36.6 C)  98 F (36.7 C)  TempSrc: Oral Oral  Oral  SpO2: 98% 100%  97%  Weight:    129 lb 8 oz (58.7 kg)  Height:        Intake/Output Summary (Last 24 hours) at 11/10/2017 1031 Last data filed at 11/10/2017 0522 Gross per 24 hour  Intake 120 ml  Output 50 ml  Net 70 ml   Filed Weights   11/08/17 1607 11/09/17 0500 11/10/17 0421  Weight: 127 lb 6.8 oz (57.8 kg) 124 lb 9 oz (56.5 kg) 129 lb 8 oz (58.7 kg)    Telemetry    Infrequent NSVT and one run of narrow complex tachycardia.  - Personally Reviewed  ECG    N/A  Physical Exam   GEN: No  acute distress.   Neck: No  JVD Cardiac: RRR, no murmurs, rubs, or gallops.  Respiratory: Clear   to  auscultation bilaterally. GI: Soft, nontender, non-distended, normal bowel sounds  MS:  No edema; No deformity. Neuro:   Nonfocal  Psych: Oriented and appropriate   Labs    Chemistry Recent Labs  Lab 11/07/17 0015 11/07/17 0425 11/10/17 0641  NA 132* 134* 136  K 3.5 3.5 4.4  CL 92* 94* 98*  CO2 28 23 28   GLUCOSE 155* 89 83  BUN 18 21* 52*  CREATININE 3.69* 3.94* 5.27*  CALCIUM 8.2* 8.1* 9.1  PROT 6.9  --   --   ALBUMIN 2.9*  --  2.5*  AST 43*  --   --   ALT 24  --   --   ALKPHOS 120  --   --   BILITOT 1.0  --   --   GFRNONAA 11* 10* 7*  GFRAA 13* 12* 8*  ANIONGAP 12 17* 10     Hematology Recent Labs  Lab 11/07/17 0425 11/09/17 1124 11/10/17 0641  WBC 6.0 3.3* 2.4*  RBC 2.75* 2.49* 2.44*  HGB 8.2* 7.5* 7.1*  HCT 26.8* 24.1*  23.7*  MCV 97.5 96.8 97.1  MCH 29.8 30.1 29.1  MCHC 30.6 31.1 30.0  RDW 22.2* 21.8* 21.3*  PLT 168 138* 125*    Cardiac Enzymes Recent Labs  Lab 11/07/17 0709 11/07/17 2010 11/08/17 0153 11/08/17 0256  TROPONINI 0.38* 0.43* 0.47* 0.46*    Recent Labs  Lab 11/07/17 0259  TROPIPOC 0.32*     BNP Recent Labs  Lab 11/07/17 0015  BNP 1,397.8*     DDimer No results for input(s): DDIMER in the last 168 hours.   Radiology    No results found.  Cardiac Studies   Echo:  12/10/09  Study Conclusions  - Left ventricle: The cavity size was mildly dilated. Wall   thickness was increased in a pattern of moderate LVH. Systolic   function was normal. The estimated ejection fraction was in the   range of 55% to 60%. - Mitral valve: Calcified annulus. Mildly thickened leaflets . - Left atrium: The atrium was moderately dilated. - Atrial septum: No defect or patent foramen ovale was identified.  Lexiscan Myoview.  IMPRESSION: 1. No reversible ischemia or infarction. 2. Normal left ventricular wall motion. 3. Left ventricular ejection fraction 53% 4. Non invasive risk stratification*: Low    Patient Profile     77  y.o. female  with a hx of DM, HTN, HLD, ESRD on HD (MWF), chronic diastolic CHF and aortic stenosis admitted for volume overload and chest pain due to not enough fluid removal by dialysis prior to presentation.  EKG with inferior lateral ST depression, now resolved.   Assessment & Plan    1. Acute on chronic diastolic CHF Improved after dialysis.  No change in therapy.   2. Elevated troponin/chest pain Negative Lexiscan Myoview.  No further cardiac work up.  3. AS Follow clinically.   Felt to be sclerotic but not stenotic.    4.  Tachycardia No symptoms associated with this.  Continue current therapy.    For questions or updates, please contact [place] Please consult [email or web address] for contact info under Cardiology/STEMI.      Signed, Minus Breeding, MD  11/10/2017, 10:31 AM

## 2017-11-10 NOTE — Progress Notes (Signed)
Patient arrived to unit per bed.  Reviewed treatment plan and this RN agrees.  Report received from bedside RN, Tiffany.  Consent verified.  Patient A & O X 4. Lung sounds diminished to ausculation in all fields. No edema. Cardiac: NSR.  Prepped LUAVG with alcohol and cannulated with two 15 gauge needles.  Pulsation of blood noted.  Flushed access well with saline per protocol.  Connected and secured lines and initiated tx at 1222.  UF goal of 1500 mL and net fluid removal of 1000 mL.  Will continue to monitor.

## 2017-11-10 NOTE — Progress Notes (Signed)
PROGRESS NOTE  [name] [name]:[id number] DOB: [birth year] DOA: 11/07/2017 PCP: Ann Held, DO  Brief Narrative: 33yow PMH ESRD on HD MWF presented with SOB and CP after HD. Admitted for acute hypoxia requiring BiPAP, demand ischemia, EKG changes  Assessment/Plan Demand ischemia in patient compliant with HD, in context of hypertensive urgency, cannot exclude angina-induced by chart but patient denies every having CP and reports did not have proper volume removed at last oupt HD. EKGs with inferolateral ST depression resolved - echo reassuring - NM stress results pending; further cardiology recs pending  Normocytic anemia, likely anemia of CKD but acute on chronic and guaiac positive. Seen in 07/2017 and got transfused. Last colonoscopy 2008. - reported blood in stool today - Hgb trending down. GI eval requested. NPO after midnight. - transfuse for Hgb <7  ERSD on HD MWF - stable s/p HD. HD 1/11.  DM type 2 diet-controlled - remains stable  Acute hypox resp failure thought secondary to acute pulm edema requiring BiPAP on admission; however nephrology subsequent questioned role of volume in presentation. S/p urgent HD 1/8 with immediate improvement and transition off BiPAP. No evidence of infection. - hypoxia resolved; resp status remains stable  Aortic atherosclerosis   DVT prophylaxis: heparin Code Status: full Family Communication: husband at bedside 1/10 Disposition Plan: home later today if NM stress negative and cleared by cardiology    [name] Hodgkins, MD  [facility] Direct contact: [phone] --[address]  --[email or web address]; password [email or web address]  7PM-7AM contact night coverage as above 11/10/2017, 1:58 PM  LOS: 3 days   Consultants:  Nephrology   Cardiology   GI  Procedures:  HD  Echo Study Conclusions  - Left ventricle: The cavity size was mildly dilated. Wall   thickness was increased in a pattern of moderate LVH. Systolic  function was normal. The estimated ejection fraction was in the   range of 55% to 60%. - Mitral valve: Calcified annulus. Mildly thickened leaflets . - Left atrium: The atrium was moderately dilated. - Atrial septum: No defect or patent foramen ovale was identified.  Antimicrobials:    Interval history/Subjective: Feels well, no pain. Reports RN told her there was some blood in her stool today.  Objective: Vitals:  Vitals:   11/10/17 1300 11/10/17 1330  BP: 120/61 (!) 94/48  Pulse: 67 66  Resp:    Temp:    SpO2:      Exam:  Constitutional:   . Appears calm and comfortable Respiratory:  . CTA bilaterally, no w/r/r.  . Respiratory effort normal.  Cardiovascular:  . RRR, no m/r/g Psychiatric:  . Mental status o Mood, affect appropriate  I have personally reviewed the following:   Labs:  CBG remains stable  Hgb 7.5 >. 7.1  Plts 138 >> 125  FOBT positive  Scheduled Meds: . amLODipine  10 mg Oral Daily  . aspirin  325 mg Oral Daily  . calcitRIOL  1.5 mcg Oral Once per day on Mon Wed Fri  . carvedilol  25 mg Oral BID WC  . cyclobenzaprine  5 mg Oral Once  . feeding supplement (PRO-STAT SUGAR FREE 64)  30 mL Oral BID  . ferrous sulfate  325-650 mg Oral Q breakfast  . furosemide  40 mg Oral Daily  . heparin  5,000 Units Subcutaneous Q8H  . hydrocortisone  25 mg Rectal BID  . insulin aspart  0-9 Units Subcutaneous TID WC  . isosorbide mononitrate  30 mg Oral BID  . lisinopril  40 mg Oral QHS  . mouth rinse  15 mL Mouth Rinse BID  . multivitamin with minerals  1 tablet Oral Daily  . sucroferric oxyhydroxide  500 mg Oral TID WC   Continuous Infusions:  Principal Problem:   Demand ischemia (HCC) Active Problems:   Type II diabetes mellitus with nephropathy (HCC)   Essential hypertension   End stage renal disease (HCC)   Normocytic anemia   Aortic atherosclerosis ([place])   LOS: 3 days

## 2017-11-10 NOTE — Progress Notes (Signed)
Pt unhappy with length of time NPO.  States she needs something to eat.  Multiple phone calls made on patients behalf to attain diet order post nuke study.  Pt kept abreast of developing diet situation.  Diet order obtained.  Sandwich offered.  Pt states she will wait until she gets to her room to eat.  Will continue to monitor.

## 2017-11-11 LAB — CBC
HEMATOCRIT: 24.4 % — AB (ref 36.0–46.0)
Hemoglobin: 7.4 g/dL — ABNORMAL LOW (ref 12.0–15.0)
MCH: 29.7 pg (ref 26.0–34.0)
MCHC: 30.3 g/dL (ref 30.0–36.0)
MCV: 98 fL (ref 78.0–100.0)
Platelets: 135 10*3/uL — ABNORMAL LOW (ref 150–400)
RBC: 2.49 MIL/uL — ABNORMAL LOW (ref 3.87–5.11)
RDW: 21.5 % — ABNORMAL HIGH (ref 11.5–15.5)
WBC: 2.6 10*3/uL — ABNORMAL LOW (ref 4.0–10.5)

## 2017-11-11 LAB — GLUCOSE, CAPILLARY: Glucose-Capillary: 146 mg/dL — ABNORMAL HIGH (ref 65–99)

## 2017-11-11 MED ORDER — PRO-STAT SUGAR FREE PO LIQD: 30.0000 mL | Freq: Two times a day (BID) | ORAL | 0 refills | Status: DC

## 2017-11-11 MED ORDER — CALCITRIOL 0.5 MCG PO CAPS: 1.5000 ug | ORAL_CAPSULE | ORAL | 0 refills | Status: DC

## 2017-11-11 NOTE — Plan of Care (Signed)
  Coping: Level of anxiety will decrease 11/11/2017 0459 - Completed/Met by Evert Kohl, RN   Pain Managment: General experience of comfort will improve 11/11/2017 0502 - Completed/Met by Evert Kohl, RN   Nutrition: Adequate nutrition will be maintained 11/11/2017 0459 - Completed/Met by Evert Kohl, RN

## 2017-11-11 NOTE — Discharge Summary (Signed)
[name] [name], is a 77 y.o. female  DOB 06-29-1941  MRN [id number].  Admission date:  11/07/2017  Admitting Physician  Velvet Bathe, MD  Discharge Date:  11/11/2017   Primary MD  Ann Held, DO  Recommendations for primary care physician for things to follow:    Trop elevation, secondary to Demand ischemiain patient compliant with HD, in context of hypertensive urgency, cannot exclude angina-induced by chart but patient denies every having CP and reports did not have proper volume removed at last oupt HD. EKGs with inferolateral ST depression resolved - Echoreassuring (11/09/2017) EF 55-60% -NM stress results (11/10/2017)   negative for ischemia   Acute Hypoxic Resp Failurethought secondary to acute pulm edema requiring BiPAP on admission (secondary to not having had enough fluid taken off at HD) However nephrology subsequent questioned role of volume in presentation. S/p urgent HD 1/8 with immediate improvement and transition off BiPAP. No evidence of infection. Hypoxia resolved, stable, back on HD, next HD will be Monday 11/13/2017  Normocytic anemia, likely anemia of CKD but acute on chronic and guaiac positive. Seen in 07/2017 and got transfused. Last colonoscopy 2008. - reported blood in stool today - Hgb stable, GI evaluation during the admission thought that anemia chronic and multifactorial, no need for endoscopy presently unless further overt bleeding CBC in 1 week per pcp   ? Cirrhosis pcp to please order RUQ ultrasound and follow up  ERSD on HD MWF - stable s/p HD 1/11 Started on Rocaltrol during this admission Next HD 11/13/2017  DM type 2 diet-controlled - remains stable    Admission Diagnosis  Low back pain [M54.5] Acute pulmonary edema (HCC) [J81.0] Abnormal EKG [R94.31] Respiratory failure (HCC) [J96.90]   Discharge Diagnosis  Low back pain [M54.5] Acute  pulmonary edema ([place]) [J81.0] Abnormal EKG [R94.31] Respiratory failure ([place]) [J96.90]      Principal Problem:   Demand ischemia ([place]) Active Problems:   Type II diabetes mellitus with nephropathy ([place])   Essential hypertension   End stage renal disease ([place])   Normocytic anemia   Aortic atherosclerosis (HCC)      Past Medical History:  Diagnosis Date  . DEPRESSION   . DIABETES MELLITUS, TYPE I   . GERD   . GLAUCOMA   . Headache(784.0)   . Hepatitis B carrier ([place])    05/2009: neg Hep C; Hep B: core pos, Surf neg; fatty liver US 8/10 - 7/13  . HYPERLIPIDEMIA   . HYPERTENSION   . Kidney failure    dialysis 3 times per week  . OSTEOPENIA   . POSTMENOPAUSAL STATUS   . Pulmonary edema   . Unspecified vitamin D deficiency     Past Surgical History:  Procedure Laterality Date  . AV FISTULA PLACEMENT Left 05/29/2014   Procedure: INSERTION OF ARTERIOVENOUS (AV) GORE-TEX GRAFT ARM-LEFT;  Surgeon: Angelia Mould, MD;  Location: [facility];  Service: Vascular;  Laterality: Left;  . CHOLECYSTECTOMY  02/12/07  . INSERTION OF DIALYSIS CATHETER Right 05/27/2014  Procedure: INSERTION OF DIALYSIS CATHETER;  Surgeon: Angelia Mould, MD;  Location: [facility];  Service: Vascular;  Laterality: Right;  . REFRACTIVE SURGERY  07/29/09   Dr. Bing Plume  . TONSILLECTOMY         HPI  from the history and physical done on the day of admission:     77 y.o. female with history of ESRD on hemodialysis on Monday Wednesday Friday, hypertension, diabetes mellitus type 2, anemia was brought to the ER after patient started developing shortness of breath last evening after dialysis when patient reached home.  Has had some cough denies any chest pain denies any fever or chills.  Has been having some nausea vomiting and diarrhea last 1 week.  ED Course: In the ER patient blood pressure was found to be elevated and patient was hypoxic and had to be placed on BiPAP.  Chest x-ray shows congestion.  Troponin  was mildly elevated.  EKG shows inferolateral leads ST depression.  On-call nephrologist has been consulted for dialysis and patient is being admitted for acute respiratory failure likely from pulmonary edema and also concerning EKG changes.      Hospital Course:     Pt was admitted for Acute Hypoxic respiratory failure w chest xray showing congestion and started on Bipap, pt received emergent dialysis on 11/07/2017.  Pt improved rapidly and came off bipap.  Troponin was elevated at 0.38 with peak 0.47,  Cardiology consulted, Echo EF preserved.  Nuclear stress test negative for ischemia.  Pt was not having any active chest pain.  Cardiology thought her trop elevation was due to demand ischemia as well as renal insufficiency.  Pt had anemia and Fobt +, she was evaluated by GI Scarlette Shorts), who thought this was multifactorial and chronic and no need for endoscopy unless she was having further GI bleeding. Pt Hgb has remained stable.  7.1=> 7.4 (11/11/2017), pt has received last HD on 11/10/2017, and will need HD on 11/13/2017 (M, W, F) schedule.  Breathing is great this am.  No further rectal bleeding, pt requesting to go home.    Follow UP  Follow-up Information    Ann Held, DO Follow up in 1 week(s).   Specialty:  Family Medicine Contact information: [address] [place] [address] [phone]            Consults obtained - GI, cardiology, nephrology  Discharge Condition: stable  Diet and Activity recommendation: See Discharge Instructions below  Discharge Instructions         Discharge Medications     Allergies as of 11/11/2017      Reactions   Hydralazine Itching   Robaxin [methocarbamol]    Dizziness       Medication List    TAKE these medications   acetaminophen 325 MG tablet Commonly known as:  TYLENOL Take 650 mg by mouth every 6 (six) hours as needed for moderate pain.   amLODipine 10 MG tablet Commonly known as:   NORVASC TAKE 1 TABLET BY MOUTH EVERY DAY   calcitRIOL 0.5 MCG capsule Commonly known as:  ROCALTROL Take 3 capsules (1.5 mcg total) by mouth 3 (three) times a week. Start taking on:  11/13/2017   carvedilol 25 MG tablet Commonly known as:  COREG TAKE 1 TABLET (25 MG TOTAL) BY MOUTH 2 (TWO) TIMES DAILY WITH A MEAL.   feeding supplement (PRO-STAT SUGAR FREE 64) Liqd Take 30 mLs by mouth 2 (two) times daily.   ferrous  sulfate 325 (65 FE) MG tablet Take 325-650 mg by mouth daily with breakfast.   furosemide 40 MG tablet Commonly known as:  LASIX TAKE 1 TABLET BY MOUTH EVERY DAY   isosorbide mononitrate 30 MG 24 hr tablet Commonly known as:  IMDUR Take 1 tablet (30 mg total) by mouth 2 (two) times daily.   lisinopril 40 MG tablet Commonly known as:  PRINIVIL,ZESTRIL Take 1 tablet (40 mg total) by mouth at bedtime.   multivitamin with minerals Tabs tablet Take 2 tablets by mouth daily.       Major procedures and Radiology Reports - PLEASE review detailed and final reports for all details, in brief -      Dg Chest 2 View  Result Date: 11/07/2017 CLINICAL DATA:  Dyspnea with cough times 1-1/2 months. EXAM: CHEST  2 VIEW COMPARISON:  08/17/2017 FINDINGS: Stable cardiomegaly with aortic atherosclerosis. Diffuse interval increase in pulmonary edema since prior with trace bilateral pleural effusions blunting the costophrenic angles. Degenerative change noted about the [facility] and glenohumeral joints as well as dorsal spine. Cholecystectomy clips are present. IMPRESSION: Stable cardiomegaly with aortic atherosclerosis. Diffuse interstitial edema consistent CHF. Trace bilateral pleural effusions blunting the costophrenic angles. Electronically Signed   By: Ashley Royalty M.D.   On: 11/07/2017 03:08   Dg Lumbar Spine 2-3 Views  Result Date: 11/07/2017 CLINICAL DATA:  Low back pain. EXAM: LUMBAR SPINE - 2-3 VIEW COMPARISON:  None FINDINGS: Normal alignment of the lumbar spine. The vertebral body  heights are well preserved. Multi level ventral endplate spurring is identified with mild disc space narrowing. No acute fractures or subluxations identified. Aortic atherosclerosis noted. IMPRESSION: 1. Multi level lumbar degenerative disc disease. 2.  Aortic Atherosclerosis (ICD10-I70.0). Electronically Signed   By: Kerby Moors M.D.   On: 11/07/2017 19:58   Ct Hip Left Wo Contrast  Result Date: 10/20/2017 CLINICAL DATA:  Left hip pain secondary to a fall yesterday. EXAM: CT OF THE LEFT HIP WITHOUT CONTRAST TECHNIQUE: Multidetector CT imaging of the left hip was performed according to the standard protocol. Multiplanar CT image reconstructions were also generated. COMPARISON:  Radiographs dated 10/20/2017 FINDINGS: Bones/Joint/Cartilage There is no acute fracture or dislocation. There is mild to moderate osteoarthritis of the left hip with marginal osteophyte formation and subcortical cyst formation in the acetabulum and femoral head. The area of concern on radiographs is normal on the CT scan. There are chronic soft tissue calcifications adjacent to the greater and lesser trochanters. Arterial vascular calcification around the hip. Chronic calcific tendinopathy of the origin of the hamstring tendons. No significant acute soft tissue abnormality. IMPRESSION: 1. No acute abnormality of the bones of the left hip. 2. Arthritic changes and other degenerative changes around the left hip as described. Electronically Signed   By: Lorriane Shire M.D.   On: 10/20/2017 11:50   Nm Myocar Multi W/spect W/wall Motion / Ef  Result Date: 11/10/2017 CLINICAL DATA:  Chest pain. Abnormal EKG. History of hypertension, diabetes and congestive heart failure. EXAM: MYOCARDIAL IMAGING WITH SPECT (REST AND PHARMACOLOGIC-STRESS) GATED LEFT VENTRICULAR WALL MOTION STUDY LEFT VENTRICULAR EJECTION FRACTION TECHNIQUE: Standard myocardial SPECT imaging was performed after resting intravenous injection of 10 mCi Tc-42m tetrofosmin.  Subsequently, intravenous infusion of Lexiscan was performed under the supervision of the Cardiology staff. At peak effect of the drug, 30 mCi Tc-15m tetrofosmin was injected intravenously and standard myocardial SPECT imaging was performed. Quantitative gated imaging was also performed to evaluate left ventricular wall motion, and estimate left ventricular ejection  fraction. COMPARISON:  Chest radiographs 11/07/2017 and 08/17/2017. FINDINGS: Perfusion: No decreased activity in the left ventricle on stress imaging to suggest reversible ischemia or infarction. Probable mild breast attenuation in the anterior wall. Wall Motion: Normal left ventricular wall motion. Mild left ventricular dilatation. Left Ventricular Ejection Fraction: 53 % End diastolic volume 759 ml End systolic volume 53 ml IMPRESSION: 1. No reversible ischemia or infarction. 2. Normal left ventricular wall motion. 3. Left ventricular ejection fraction 53% 4. Non invasive risk stratification*: Low *2012 Appropriate Use Criteria for Coronary Revascularization Focused Update: J Am Coll Cardiol. 1638;46(6):599-357. http://content.airportbarriers.com.aspx?articleid=[email or web address] Electronically Signed   By: Richardean Sale M.D.   On: 11/10/2017 13:58   Dg Hip Unilat With Pelvis 2-3 Views Left  Result Date: 10/20/2017 CLINICAL DATA:  Left flank/hip pain after a fall yesterday. Pt states she tripped over curb. EXAM: DG HIP (WITH OR WITHOUT PELVIS) 2-3V LEFT COMPARISON:  05/22/2014 FINDINGS: There is a linear lucency in the trochanteric region on the external rotation view. This raises question of minimally displaced intertrochanteric fracture. No evidence for dislocation. There is extensive atherosclerotic calcification, showing significant change since 2015. Otherwise the pelvis appears intact. IMPRESSION: Question of left intertrochanteric fracture. CT is recommended for further evaluation. Electronically Signed   By: Nolon Nations M.D.   On:  10/20/2017 09:28    Micro Results     Recent Results (from the past 240 hour(s))  MRSA PCR Screening     Status: None   Collection Time: 11/07/17 10:34 PM  Result Value Ref Range Status   MRSA by PCR NEGATIVE NEGATIVE Final    Comment:        The GeneXpert MRSA Assay (FDA approved for NASAL specimens only), is one component of a comprehensive MRSA colonization surveillance program. It is not intended to diagnose MRSA infection nor to guide or monitor treatment for MRSA infections.        Today   Subjective    Alzora Ha today has no chest pain, no dyspnea, feeling well.  Afebrile.  No brbpr.   No headache,no abdominal pain,no new weakness tingling or numbness, feels much better wants to go home today.    Objective   Blood pressure (!) 145/51, pulse 72, temperature 98.6 F (37 C), temperature source Oral, resp. rate 18, height 5\' 5"  (1.651 m), weight 58.7 kg (129 lb 4.8 oz), SpO2 99 %.   Intake/Output Summary (Last 24 hours) at 11/11/2017 0803 Last data filed at 11/11/2017 0230 Gross per 24 hour  Intake 240 ml  Output 575 ml  Net -335 ml    Exam Awake Alert, Oriented x 3, No new F.N deficits, Normal affect [place].AT,PERRAL Supple Neck,No JVD, No cervical lymphadenopathy appriciated.  Symmetrical Chest wall movement, Good air movement bilaterally, CTAB RRR,No Gallops,Rubs or new Murmurs, No Parasternal Heave +ve B.Sounds, Abd Soft, Non tender, No organomegaly appriciated, No rebound -guarding or rigidity. No Cyanosis, Clubbing or edema, No new Rash or bruise   Data Review   CBC w Diff:  Lab Results  Component Value Date   WBC 2.6 (L) 11/11/2017   HGB 7.4 (L) 11/11/2017   HCT 24.4 (L) 11/11/2017   PLT 135 (L) 11/11/2017   LYMPHOPCT 10 11/07/2017   MONOPCT 4 11/07/2017   EOSPCT 2 11/07/2017   BASOPCT 0 11/07/2017    CMP:  Lab Results  Component Value Date   NA 136 11/10/2017   K 4.4 11/10/2017   CL 98 (L) 11/10/2017   CO2 28 11/10/2017   BUN  52 (H) 11/10/2017   CREATININE 5.27 (H) 11/10/2017   PROT 6.9 11/07/2017   ALBUMIN 2.5 (L) 11/10/2017   BILITOT 1.0 11/07/2017   ALKPHOS 120 11/07/2017   AST 43 (H) 11/07/2017   ALT 24 11/07/2017  .   Total Time in preparing paper work, data evaluation and todays exam - 51 minutes  Jani Gravel M.D on 11/11/2017 at 8:03 AM  [facility]   Office  [phone]

## 2017-11-11 NOTE — Progress Notes (Signed)
[facility] Progress Note   Subjective:  No c/o, passed the stress test, for dc today per the pt, no c/o  Objective Vitals:   11/10/17 1455 11/10/17 1458 11/10/17 2130 11/11/17 0516  BP: (!) 126/101 (!) 171/63 (!) 145/70 (!) 145/51  Pulse: (!) 55 71 71 72  Resp: 16  18 18   Temp: 97.8 F (36.6 C)  98.6 F (37 C) 98.6 F (37 C)  TempSrc:   Oral Oral  SpO2:   100% 99%  Weight: 59 kg (130 lb 1.1 oz)   58.7 kg (129 lb 4.8 oz)  Height:       Physical Exam General: Well appearing, NAD. On room air. Heart: RRR Lungs: CTAB Extremities: No LE edema Dialysis Access: AVG  Additional Objective Labs: Basic Metabolic Panel: Recent Labs  Lab 11/07/17 0015 11/07/17 0425 11/10/17 0641  NA 132* 134* 136  K 3.5 3.5 4.4  CL 92* 94* 98*  CO2 28 23 28   GLUCOSE 155* 89 83  BUN 18 21* 52*  CREATININE 3.69* 3.94* 5.27*  CALCIUM 8.2* 8.1* 9.1  PHOS  --   --  4.3   Liver Function Tests: Recent Labs  Lab 11/07/17 0015 11/10/17 0641  AST 43*  --   ALT 24  --   ALKPHOS 120  --   BILITOT 1.0  --   PROT 6.9  --   ALBUMIN 2.9* 2.5*   No results for input(s): LIPASE, AMYLASE in the last 168 hours. CBC: Recent Labs  Lab 11/07/17 0015 11/07/17 0425 11/09/17 1124 11/10/17 0641 11/11/17 0343  WBC 7.1 6.0 3.3* 2.4* 2.6*  NEUTROABS 6.0  --   --   --   --   HGB 8.0* 8.2* 7.5* 7.1* 7.4*  HCT 26.4* 26.8* 24.1* 23.7* 24.4*  MCV 96.0 97.5 96.8 97.1 98.0  PLT 175 168 138* 125* 135*   Blood Culture    Component Value Date/Time   SDES BLOOD LEFT FOREARM  4 ML IN EACH BOTTLE 05/17/2014 1657   SDES BLOOD RIGHT FOREARM  4 ML IN [facility] BOTTLE 05/17/2014 1657   SPECREQUEST Normal 05/17/2014 1657   SPECREQUEST Normal 05/17/2014 1657   CULT  05/17/2014 1657    NO GROWTH 5 DAYS Performed at [facility]  05/17/2014 1657    NO GROWTH 5 DAYS Performed at [facility] 05/23/2014 FINAL 05/17/2014 1657   REPTSTATUS 05/23/2014 FINAL  05/17/2014 1657    Cardiac Enzymes: Recent Labs  Lab 11/07/17 0709 11/07/17 2010 11/08/17 0153 11/08/17 0256  TROPONINI 0.38* 0.43* 0.47* 0.46*   CBG: Recent Labs  Lab 11/09/17 1644 11/09/17 2215 11/10/17 0745 11/10/17 1656 11/11/17 0755  GLUCAP 97 184* 90 90 146*   Iron Studies: No results for input(s): IRON, TIBC, TRANSFERRIN, FERRITIN in the last 72 hours. @lablastinr3 @ Studies/Results: Nm Myocar Multi W/spect W/wall Motion / Ef  Result Date: 11/10/2017 CLINICAL DATA:  Chest pain. Abnormal EKG. History of hypertension, diabetes and congestive heart failure. EXAM: MYOCARDIAL IMAGING WITH SPECT (REST AND PHARMACOLOGIC-STRESS) GATED LEFT VENTRICULAR WALL MOTION STUDY LEFT VENTRICULAR EJECTION FRACTION TECHNIQUE: Standard myocardial SPECT imaging was performed after resting intravenous injection of 10 mCi Tc-20m tetrofosmin. Subsequently, intravenous infusion of Lexiscan was performed under the supervision of the Cardiology staff. At peak effect of the drug, 30 mCi Tc-98m tetrofosmin was injected intravenously and standard myocardial SPECT imaging was performed. Quantitative gated imaging was also performed to evaluate left ventricular wall motion, and estimate left ventricular ejection  fraction. COMPARISON:  Chest radiographs 11/07/2017 and 08/17/2017. FINDINGS: Perfusion: No decreased activity in the left ventricle on stress imaging to suggest reversible ischemia or infarction. Probable mild breast attenuation in the anterior wall. Wall Motion: Normal left ventricular wall motion. Mild left ventricular dilatation. Left Ventricular Ejection Fraction: 53 % End diastolic volume 563 ml End systolic volume 53 ml IMPRESSION: 1. No reversible ischemia or infarction. 2. Normal left ventricular wall motion. 3. Left ventricular ejection fraction 53% 4. Non invasive risk stratification*: Low *2012 Appropriate Use Criteria for Coronary Revascularization Focused Update: J Am Coll Cardiol.  1497;02(6):378-588. http://content.airportbarriers.com.aspx?articleid=[email or web address] Electronically Signed   By: Richardean Sale M.D.   On: 11/10/2017 13:58   Medications:  . amLODipine  10 mg Oral Daily  . aspirin  325 mg Oral Daily  . calcitRIOL  1.5 mcg Oral Once per day on Mon Wed Fri  . carvedilol  25 mg Oral BID WC  . cyclobenzaprine  5 mg Oral Once  . feeding supplement (PRO-STAT SUGAR FREE 64)  30 mL Oral BID  . ferrous sulfate  325-650 mg Oral Q breakfast  . furosemide  40 mg Oral Daily  . heparin  5,000 Units Subcutaneous Q8H  . hydrocortisone  25 mg Rectal BID  . insulin aspart  0-9 Units Subcutaneous TID WC  . isosorbide mononitrate  30 mg Oral BID  . lisinopril  40 mg Oral QHS  . mouth rinse  15 mL Mouth Rinse BID  . multivitamin with minerals  1 tablet Oral Daily  . sucroferric oxyhydroxide  500 mg Oral TID WC    Dialysis Orders: MWF at [facility] 3:30h, 400/800, EDW 60.5kg, 2K/2Ca, AVG, heparin 5000 bolus - Mircera 279mcg IV q 2 weeks (last 1/2) - Calcitriol 1.34mcg PO q HD - Venofer 100mg  x 10 ordered (s/p 3 of 10 so far) - Binders: Velphoro 1/meals  Assessment: 1. Dyspnea/volume overload/pulm edema: On bi-pap on admit, now resolved. Vol excess and/or ischemia. Had negative stress test. For dc today.  2.  ESRD : HD mwf 3. Hypertension/volume:will lower dry wt to 59kg at dc 4. Anemia of :Hgb 8.2. ESA just given. Repeat CBC today. 5. Metabolic bone disease:Corr Ca ok, Phos pending. Continue binders/VDRA. 6. Nutrition:Alb low, adding supps. 7. DM:Per primary. 8.  Elevated trop: stress test pending, per cards.   Plan - as above   Kelly Splinter MD [facility] pager [phone]   11/11/2017, 9:17 AM

## 2017-11-11 NOTE — Progress Notes (Signed)
Pt got discharged to home, discharge instructions provided and patient showed understanding to it, IV taken out,Telemonitor DC,pt left unit in wheelchair with all of the belongings accompanied with a family member (Husband)

## 2017-11-11 NOTE — Plan of Care (Signed)
  Coping: Level of anxiety will decrease 11/11/2017 0459 - Completed/Met by Evert Kohl, RN   Nutrition: Adequate nutrition will be maintained 11/11/2017 0459 - Completed/Met by Evert Kohl, RN

## 2017-11-13 ENCOUNTER — Telehealth: Payer: Self-pay

## 2017-11-13 ENCOUNTER — Other Ambulatory Visit: Payer: Self-pay

## 2017-11-13 DIAGNOSIS — N186 End stage renal disease: Secondary | ICD-10-CM | POA: Diagnosis not present

## 2017-11-13 DIAGNOSIS — N2581 Secondary hyperparathyroidism of renal origin: Secondary | ICD-10-CM | POA: Diagnosis not present

## 2017-11-13 LAB — GLUCOSE, CAPILLARY: GLUCOSE-CAPILLARY: 111 mg/dL — AB (ref 65–99)

## 2017-11-13 NOTE — Telephone Encounter (Signed)
[facility] follow up call made to patient. Per husband, patient is at Dialysis and I can try back after 1 pm today.

## 2017-11-13 NOTE — Telephone Encounter (Signed)
11/13/17  TCM Hospital Follow Up   Note: PATIENT TO HAVE CBC DRAWN ON DAY OF APPOINTMENT.  Transition Care Management Follow-up Telephone Call  ADMISSION DATE:  11/07/2017  DISCHARGE DATE: 11/11/2017   How have you been since you were released from the hospital? [facility] other than weakness per patient.   Do you understand why you were in the hospital? Yes, had a bad fall   Do you understand the discharge instrcutions?  Yes    Items Reviewed:  Medications reviewed: Yes   Allergies reviewed: Yes   Dietary changes reviewed: RENAL patient is on Dialysis     Referrals reviewed: Appointment scheduled with Dr. Carollee Herter for 11/16/17   Functional Questionnaire:  Activities of Daily Living (ADLs): Patient can perform all independently.  Any patient concerns? Not at this time per patient.   Confirmed importance and date/time of follow-up visits scheduled: Yes   Confirmed with patient if condition begins to worsen call PCP or go to the ER. Yes    Patient was given the office number and encouragred to call back with questions or concerns. Yes

## 2017-11-13 NOTE — Patient Outreach (Signed)
[name] [facility]) Care Management  [name]   11/13/2017  [name] [name] 1940-12-07 [id number]  Subjective: Feeling better, says her husband has been taking good care of her.  Objective:  Current Medications: Current Outpatient Medications  Medication Sig Dispense Refill  . acetaminophen (TYLENOL) 325 MG tablet Take 650 mg by mouth every 6 (six) hours as needed for moderate pain.     . Amino Acids-Protein Hydrolys (FEEDING SUPPLEMENT, PRO-STAT SUGAR FREE 64,) LIQD Take 30 mLs by mouth 2 (two) times daily. 900 mL 0  . amLODipine (NORVASC) 10 MG tablet TAKE 1 TABLET BY MOUTH EVERY DAY 90 tablet 3  . calcitRIOL (ROCALTROL) 0.5 MCG capsule Take 3 capsules (1.5 mcg total) by mouth 3 (three) times a week. 15 capsule 0  . carvedilol (COREG) 25 MG tablet TAKE 1 TABLET (25 MG TOTAL) BY MOUTH 2 (TWO) TIMES DAILY WITH A MEAL. 60 tablet 3  . ferrous sulfate 325 (65 FE) MG tablet Take 325-650 mg by mouth daily with breakfast.    . furosemide (LASIX) 40 MG tablet TAKE 1 TABLET BY MOUTH EVERY DAY 30 tablet 3  . isosorbide mononitrate (IMDUR) 30 MG 24 hr tablet Take 1 tablet (30 mg total) by mouth 2 (two) times daily. 60 tablet 1  . lisinopril (PRINIVIL,ZESTRIL) 40 MG tablet Take 1 tablet (40 mg total) by mouth at bedtime. 90 tablet 1  . Multiple Vitamin (MULTIVITAMIN WITH MINERALS) TABS tablet Take 2 tablets by mouth daily.     No current facility-administered medications for this visit.     Functional Status: In your present state of health, do you have any difficulty performing the following activities: 11/08/2017 08/17/2017  Hearing? N N  Vision? N Y  Comment - GLAUCOMA  Difficulty concentrating or making decisions? N N  Walking or climbing stairs? Y N  Dressing or bathing? N N  Doing errands, shopping? N N  Some recent data might be hidden    Fall/Depression Screening: Fall Risk  07/21/2015 06/16/2014 12/28/2012  Falls in the past year? No Yes No  Number falls in past  yr: - 1 -  Injury with Fall? - No -   PHQ 2/9 Scores 07/21/2015 06/16/2014 12/28/2012  PHQ - 2 Score 0 0 0   ASSESSMENT: Date Discharged from Hospital: 11/11/2017 Date Medication Reconciliation Performed: 11/13/2017  New Medications at Discharge:    Calcitriol 0.5 mcg capsule: take 3 capsules (1.5 mcg total) by mouth 3 times a week.  Patient was recently discharged from hospital and all medications have been reviewed   Drugs sorted by system:  Cardiovascular: amlodipine 10 mg QD, carvedilol 25 mg BID, furosemide 40 mg QD, isosorbide mononitrate 30 mg BID, lisinopril 40 mg QD  Renal: Calcitriol 1.5 mcg PO three times a week  Pain: APAP 650 mg PO every 6 hours prn  Vitamins/Minerals: MVI: 2 tablets QD  Miscellaneous: ferrous sulfate 325 mg PO with breakfast  Duplications in therapy: n/a Gaps in therapy: Patient is not adherent with isosorbide mononitrate or furosemide  Medications to avoid in the elderly: n/a Drug interactions: n/a Other issues noted: Adherence issues above  PLAN: -Patient was encouraged to continue to take new medications as prescribed and to speak with her cardiologist (she has an appointment on Thursday) about whether she needs to be taking isosorbide mononitrate and furosemide daily. Patient agreed and understood the plan.  -Patient was thankful for the opportunity to ask questions.  Thank you for allowing pharmacy to be involved in this  patient's care.   Laverda Stribling L. Kyung Rudd, PharmD, [place] PGY1 Pharmacy Resident Pager: [phone]

## 2017-11-15 ENCOUNTER — Other Ambulatory Visit: Payer: Self-pay

## 2017-11-15 ENCOUNTER — Encounter (HOSPITAL_COMMUNITY): Payer: Self-pay | Admitting: Emergency Medicine

## 2017-11-15 ENCOUNTER — Emergency Department (HOSPITAL_COMMUNITY): Payer: Medicare HMO

## 2017-11-15 ENCOUNTER — Observation Stay (HOSPITAL_COMMUNITY)
Admission: EM | Admit: 2017-11-15 | Discharge: 2017-11-16 | Disposition: A | Discharge status: Home / Self Care | Payer: Medicare HMO | Attending: Internal Medicine | Admitting: Internal Medicine

## 2017-11-15 DIAGNOSIS — J101 Influenza due to other identified influenza virus with other respiratory manifestations: Secondary | ICD-10-CM | POA: Insufficient documentation

## 2017-11-15 DIAGNOSIS — E559 Vitamin D deficiency, unspecified: Secondary | ICD-10-CM | POA: Insufficient documentation

## 2017-11-15 DIAGNOSIS — I252 Old myocardial infarction: Secondary | ICD-10-CM | POA: Diagnosis not present

## 2017-11-15 DIAGNOSIS — Z79899 Other long term (current) drug therapy: Secondary | ICD-10-CM | POA: Diagnosis not present

## 2017-11-15 DIAGNOSIS — N2581 Secondary hyperparathyroidism of renal origin: Secondary | ICD-10-CM | POA: Diagnosis not present

## 2017-11-15 DIAGNOSIS — R509 Fever, unspecified: Secondary | ICD-10-CM | POA: Diagnosis not present

## 2017-11-15 DIAGNOSIS — E1121 Type 2 diabetes mellitus with diabetic nephropathy: Secondary | ICD-10-CM | POA: Insufficient documentation

## 2017-11-15 DIAGNOSIS — K219 Gastro-esophageal reflux disease without esophagitis: Secondary | ICD-10-CM | POA: Diagnosis present

## 2017-11-15 DIAGNOSIS — M6281 Muscle weakness (generalized): Secondary | ICD-10-CM | POA: Insufficient documentation

## 2017-11-15 DIAGNOSIS — R05 Cough: Secondary | ICD-10-CM | POA: Diagnosis not present

## 2017-11-15 DIAGNOSIS — R262 Difficulty in walking, not elsewhere classified: Secondary | ICD-10-CM | POA: Diagnosis not present

## 2017-11-15 DIAGNOSIS — N186 End stage renal disease: Secondary | ICD-10-CM | POA: Insufficient documentation

## 2017-11-15 DIAGNOSIS — D631 Anemia in chronic kidney disease: Secondary | ICD-10-CM | POA: Diagnosis not present

## 2017-11-15 DIAGNOSIS — D649 Anemia, unspecified: Secondary | ICD-10-CM | POA: Diagnosis present

## 2017-11-15 DIAGNOSIS — I132 Hypertensive heart and chronic kidney disease with heart failure and with stage 5 chronic kidney disease, or end stage renal disease: Secondary | ICD-10-CM | POA: Diagnosis not present

## 2017-11-15 DIAGNOSIS — R011 Cardiac murmur, unspecified: Secondary | ICD-10-CM | POA: Insufficient documentation

## 2017-11-15 DIAGNOSIS — A419 Sepsis, unspecified organism: Secondary | ICD-10-CM | POA: Diagnosis present

## 2017-11-15 DIAGNOSIS — F05 Delirium due to known physiological condition: Secondary | ICD-10-CM

## 2017-11-15 DIAGNOSIS — Z888 Allergy status to other drugs, medicaments and biological substances status: Secondary | ICD-10-CM | POA: Insufficient documentation

## 2017-11-15 DIAGNOSIS — G9341 Metabolic encephalopathy: Secondary | ICD-10-CM | POA: Diagnosis not present

## 2017-11-15 DIAGNOSIS — I5032 Chronic diastolic (congestive) heart failure: Secondary | ICD-10-CM | POA: Diagnosis not present

## 2017-11-15 DIAGNOSIS — B181 Chronic viral hepatitis B without delta-agent: Secondary | ICD-10-CM | POA: Insufficient documentation

## 2017-11-15 DIAGNOSIS — N959 Unspecified menopausal and perimenopausal disorder: Secondary | ICD-10-CM | POA: Diagnosis not present

## 2017-11-15 DIAGNOSIS — R2681 Unsteadiness on feet: Secondary | ICD-10-CM | POA: Insufficient documentation

## 2017-11-15 DIAGNOSIS — E785 Hyperlipidemia, unspecified: Secondary | ICD-10-CM | POA: Diagnosis not present

## 2017-11-15 DIAGNOSIS — R4182 Altered mental status, unspecified: Secondary | ICD-10-CM

## 2017-11-15 DIAGNOSIS — I10 Essential (primary) hypertension: Secondary | ICD-10-CM | POA: Diagnosis present

## 2017-11-15 DIAGNOSIS — J111 Influenza due to unidentified influenza virus with other respiratory manifestations: Secondary | ICD-10-CM

## 2017-11-15 DIAGNOSIS — R402441 Other coma, without documented Glasgow coma scale score, or with partial score reported, in the field [EMT or ambulance]: Secondary | ICD-10-CM | POA: Diagnosis not present

## 2017-11-15 DIAGNOSIS — I214 Non-ST elevation (NSTEMI) myocardial infarction: Secondary | ICD-10-CM | POA: Diagnosis present

## 2017-11-15 DIAGNOSIS — R402412 Glasgow coma scale score 13-15, at arrival to emergency department: Secondary | ICD-10-CM | POA: Insufficient documentation

## 2017-11-15 DIAGNOSIS — M8580 Other specified disorders of bone density and structure, unspecified site: Secondary | ICD-10-CM | POA: Diagnosis not present

## 2017-11-15 DIAGNOSIS — H409 Unspecified glaucoma: Secondary | ICD-10-CM | POA: Diagnosis not present

## 2017-11-15 DIAGNOSIS — E1122 Type 2 diabetes mellitus with diabetic chronic kidney disease: Secondary | ICD-10-CM | POA: Diagnosis not present

## 2017-11-15 DIAGNOSIS — Z992 Dependence on renal dialysis: Secondary | ICD-10-CM | POA: Diagnosis not present

## 2017-11-15 DIAGNOSIS — I6789 Other cerebrovascular disease: Secondary | ICD-10-CM | POA: Diagnosis not present

## 2017-11-15 LAB — COMPREHENSIVE METABOLIC PANEL
ALK PHOS: 132 U/L — AB (ref 38–126)
ALT: 34 U/L (ref 14–54)
AST: 57 U/L — ABNORMAL HIGH (ref 15–41)
Albumin: 3.2 g/dL — ABNORMAL LOW (ref 3.5–5.0)
Anion gap: 14 (ref 5–15)
BILIRUBIN TOTAL: 0.9 mg/dL (ref 0.3–1.2)
BUN: 12 mg/dL (ref 6–20)
CO2: 29 mmol/L (ref 22–32)
CREATININE: 2.86 mg/dL — AB (ref 0.44–1.00)
Calcium: 8.6 mg/dL — ABNORMAL LOW (ref 8.9–10.3)
Chloride: 93 mmol/L — ABNORMAL LOW (ref 101–111)
GFR, EST AFRICAN AMERICAN: 17 mL/min — AB (ref >60)
GFR, EST NON AFRICAN AMERICAN: 15 mL/min — AB (ref >60)
Glucose, Bld: 106 mg/dL — ABNORMAL HIGH (ref 65–99)
Potassium: 3.5 mmol/L (ref 3.5–5.1)
Sodium: 136 mmol/L (ref 135–145)
Total Protein: 8.2 g/dL — ABNORMAL HIGH (ref 6.5–8.1)

## 2017-11-15 LAB — CBC WITH DIFFERENTIAL/PLATELET
Basophils Absolute: 0 10*3/uL (ref 0.0–0.1)
Basophils Relative: 1 %
Eosinophils Absolute: 0.1 10*3/uL (ref 0.0–0.7)
Eosinophils Relative: 2 %
HCT: 31.3 % — ABNORMAL LOW (ref 36.0–46.0)
Hemoglobin: 9.9 g/dL — ABNORMAL LOW (ref 12.0–15.0)
Lymphocytes Relative: 12 %
Lymphs Abs: 0.5 10*3/uL — ABNORMAL LOW (ref 0.7–4.0)
MCH: 30.7 pg (ref 26.0–34.0)
MCHC: 31.6 g/dL (ref 30.0–36.0)
MCV: 96.9 fL (ref 78.0–100.0)
MONO ABS: 0.4 10*3/uL (ref 0.1–1.0)
Monocytes Relative: 9 %
NEUTROS PCT: 76 %
Neutro Abs: 3 10*3/uL (ref 1.7–7.7)
PLATELETS: 170 10*3/uL (ref 150–400)
RBC: 3.23 MIL/uL — ABNORMAL LOW (ref 3.87–5.11)
RDW: 21.2 % — AB (ref 11.5–15.5)
WBC: 4 10*3/uL (ref 4.0–10.5)

## 2017-11-15 LAB — GLUCOSE, CAPILLARY: Glucose-Capillary: 97 mg/dL (ref 65–99)

## 2017-11-15 LAB — URINALYSIS, ROUTINE W REFLEX MICROSCOPIC
Bacteria, UA: NONE SEEN
Bilirubin Urine: NEGATIVE
GLUCOSE, UA: NEGATIVE mg/dL
Hgb urine dipstick: NEGATIVE
KETONES UR: NEGATIVE mg/dL
Leukocytes, UA: NEGATIVE
Nitrite: NEGATIVE
PH: 7 (ref 5.0–8.0)
Protein, ur: 100 mg/dL — AB
SPECIFIC GRAVITY, URINE: 1.015 (ref 1.005–1.030)
Squamous Epithelial / LPF: NONE SEEN

## 2017-11-15 LAB — PROCALCITONIN: PROCALCITONIN: 0.57 ng/mL

## 2017-11-15 LAB — TSH: TSH: 0.536 u[IU]/mL (ref 0.350–4.500)

## 2017-11-15 LAB — HEMOGLOBIN A1C
Hgb A1c MFr Bld: 4.8 % (ref 4.8–5.6)
Mean Plasma Glucose: 91.06 mg/dL

## 2017-11-15 LAB — LACTIC ACID, PLASMA: LACTIC ACID, VENOUS: 0.9 mmol/L (ref 0.5–1.9)

## 2017-11-15 LAB — CBG MONITORING, ED
Glucose-Capillary: 100 mg/dL — ABNORMAL HIGH (ref 65–99)
Glucose-Capillary: 78 mg/dL (ref 65–99)

## 2017-11-15 LAB — I-STAT CG4 LACTIC ACID, ED
LACTIC ACID, VENOUS: 1.26 mmol/L (ref 0.5–1.9)
Lactic Acid, Venous: 1.07 mmol/L (ref 0.5–1.9)

## 2017-11-15 MED ORDER — BISACODYL 10 MG RE SUPP: 10.0000 mg | Freq: Every day | RECTAL | Status: DC | PRN

## 2017-11-15 MED ORDER — ACETAMINOPHEN 650 MG RE SUPP: 650.0000 mg | Freq: Four times a day (QID) | RECTAL | Status: DC | PRN

## 2017-11-15 MED ORDER — PIPERACILLIN-TAZOBACTAM 3.375 G IVPB: 3.3750 g | Freq: Two times a day (BID) | INTRAVENOUS | Status: DC

## 2017-11-15 MED ORDER — BENZONATATE 100 MG PO CAPS
200.0000 mg | ORAL_CAPSULE | Freq: Two times a day (BID) | ORAL | Status: DC | PRN
  Administered 2017-11-15: 200 mg via ORAL
  Filled 2017-11-15: qty 2

## 2017-11-15 MED ORDER — ONDANSETRON HCL 4 MG/2ML IJ SOLN: 4.0000 mg | Freq: Four times a day (QID) | INTRAMUSCULAR | Status: DC | PRN

## 2017-11-15 MED ORDER — VANCOMYCIN HCL IN DEXTROSE 1-5 GM/200ML-% IV SOLN: 1000.0000 mg | Freq: Once | INTRAVENOUS | Status: DC

## 2017-11-15 MED ORDER — SENNOSIDES-DOCUSATE SODIUM 8.6-50 MG PO TABS: 1.0000 | ORAL_TABLET | Freq: Every evening | ORAL | Status: DC | PRN

## 2017-11-15 MED ORDER — CARVEDILOL 25 MG PO TABS
25.0000 mg | ORAL_TABLET | Freq: Two times a day (BID) | ORAL | Status: DC
  Administered 2017-11-15 – 2017-11-16 (×2): 25 mg via ORAL
  Filled 2017-11-15 (×2): qty 1
  Filled 2017-11-15: qty 2

## 2017-11-15 MED ORDER — VANCOMYCIN HCL 10 G IV SOLR
1250.0000 mg | Freq: Once | INTRAVENOUS | Status: DC
  Administered 2017-11-15: 1250 mg via INTRAVENOUS
  Filled 2017-11-15: qty 1250

## 2017-11-15 MED ORDER — PRO-STAT SUGAR FREE PO LIQD
30.0000 mL | Freq: Two times a day (BID) | ORAL | Status: DC
  Administered 2017-11-15 – 2017-11-16 (×2): 30 mL via ORAL
  Filled 2017-11-15 (×3): qty 30

## 2017-11-15 MED ORDER — ADULT MULTIVITAMIN W/MINERALS CH: 2.0000 | ORAL_TABLET | Freq: Every day | ORAL | Status: DC

## 2017-11-15 MED ORDER — LISINOPRIL 40 MG PO TABS
40.0000 mg | ORAL_TABLET | Freq: Every day | ORAL | Status: DC
  Administered 2017-11-15: 40 mg via ORAL
  Filled 2017-11-15: qty 1

## 2017-11-15 MED ORDER — ACETAMINOPHEN 325 MG PO TABS
650.0000 mg | ORAL_TABLET | Freq: Four times a day (QID) | ORAL | Status: DC | PRN
  Administered 2017-11-15: 650 mg via ORAL
  Filled 2017-11-15: qty 2

## 2017-11-15 MED ORDER — HYDROCODONE-ACETAMINOPHEN 5-325 MG PO TABS: 1.0000 | ORAL_TABLET | ORAL | Status: DC | PRN

## 2017-11-15 MED ORDER — PIPERACILLIN-TAZOBACTAM 3.375 G IVPB 30 MIN
3.3750 g | Freq: Once | INTRAVENOUS | Status: AC
  Administered 2017-11-15: 3.375 g via INTRAVENOUS
  Filled 2017-11-15: qty 50

## 2017-11-15 MED ORDER — HEPARIN SODIUM (PORCINE) 5000 UNIT/ML IJ SOLN
5000.0000 [IU] | Freq: Three times a day (TID) | INTRAMUSCULAR | Status: DC
  Filled 2017-11-15: qty 1

## 2017-11-15 MED ORDER — INSULIN ASPART 100 UNIT/ML ~~LOC~~ SOLN: 0.0000 [IU] | Freq: Three times a day (TID) | SUBCUTANEOUS | Status: DC

## 2017-11-15 MED ORDER — CALCITRIOL 0.5 MCG PO CAPS
1.5000 ug | ORAL_CAPSULE | ORAL | Status: DC
  Administered 2017-11-16: 1.5 ug via ORAL
  Filled 2017-11-15: qty 3

## 2017-11-15 MED ORDER — FERROUS SULFATE 325 (65 FE) MG PO TABS
325.0000 mg | ORAL_TABLET | Freq: Every day | ORAL | Status: DC
  Administered 2017-11-16: 325 mg via ORAL
  Filled 2017-11-15: qty 1

## 2017-11-15 MED ORDER — ISOSORBIDE MONONITRATE ER 30 MG PO TB24
30.0000 mg | ORAL_TABLET | Freq: Two times a day (BID) | ORAL | Status: DC
  Administered 2017-11-15 – 2017-11-16 (×2): 30 mg via ORAL
  Filled 2017-11-15 (×2): qty 1

## 2017-11-15 MED ORDER — AMLODIPINE BESYLATE 10 MG PO TABS
10.0000 mg | ORAL_TABLET | Freq: Every day | ORAL | Status: DC
  Administered 2017-11-15 – 2017-11-16 (×2): 10 mg via ORAL
  Filled 2017-11-15 (×2): qty 1

## 2017-11-15 NOTE — ED Notes (Signed)
Patient refusing flu swab/respiratory panel.  Patient will not allow this nurse to swab her nose for any reason.

## 2017-11-15 NOTE — ED Notes (Signed)
Renal/Carb modified dinner tray ordered for patient.

## 2017-11-15 NOTE — Progress Notes (Signed)
Called ER RN for report. Room ready.  

## 2017-11-15 NOTE — ED Provider Notes (Signed)
[facility] EMERGENCY DEPARTMENT Provider Note   CSN: [id number] Arrival date & time: 11/15/17  1131     History   Chief Complaint Chief Complaint  Patient presents with  . Altered Mental Status    HPI [name] [name] is a 77 y.o. female.  77 year old female with history of end-stage renal disease presents from dialysis with acute onset of altered mental status.  Patient only had about 20 minutes left in her dialysis treatment when she became altered.  She was warm to the touch and EMS had a temperature of 103.7.  Per EMS, patient has recent history of cough and congestion.  No vomiting or diarrhea.  History is limited due to her current state.        Past Medical History:  Diagnosis Date  . DEPRESSION   . DIABETES MELLITUS, TYPE I   . GERD   . GLAUCOMA   . Headache(784.0)   . Hepatitis B carrier ([place])    05/2009: neg Hep C; Hep B: core pos, Surf neg; fatty liver US 8/10 - 7/13  . HYPERLIPIDEMIA   . HYPERTENSION   . Kidney failure    dialysis 3 times per week  . OSTEOPENIA   . POSTMENOPAUSAL STATUS   . Pulmonary edema   . Unspecified vitamin D deficiency     Patient Active Problem List   Diagnosis Date Noted  . Demand ischemia ([place]) 11/10/2017  . Normocytic anemia 11/10/2017  . Aortic atherosclerosis ([place]) 11/10/2017  . Respiratory failure with hypoxia ([place]) 08/18/2017  . Volume overload 08/18/2017  . Dialysis patient, noncompliant ([place]) 08/17/2017  . Thumb pain, left 12/22/2016  . Chronic diastolic heart failure ([place]) 11/25/2016  . End stage renal disease ([place]) 06/25/2014  . Malignant hypertension 05/17/2014  . Chest pain 05/17/2014  . Elevated troponin 05/17/2014  . Hypertensive emergency 05/17/2014  . NSTEMI (non-ST elevated myocardial infarction) ([place]) 05/17/2014  . Chronic kidney disease, stage IV (severe) ([place]) 12/05/2013  . GERD 06/17/2009  . Hepatitis B carrier ([place]) 06/17/2009  . UNSPECIFIED VITAMIN D DEFICIENCY 05/21/2009  .  DEPRESSION 05/20/2009  . GLAUCOMA 05/20/2009  . HEADACHE 05/20/2009  . CARDIAC MURMUR 05/20/2009  . OSTEOPENIA 02/15/2008  . POSTMENOPAUSAL STATUS 12/21/2007  . Hyperlipidemia LDL goal <70 09/21/2007  . Essential hypertension 09/13/2007  . Type II diabetes mellitus with nephropathy [facility])     Past Surgical History:  Procedure Laterality Date  . AV FISTULA PLACEMENT Left 05/29/2014   Procedure: INSERTION OF ARTERIOVENOUS (AV) GORE-TEX GRAFT ARM-LEFT;  Surgeon: Angelia Mould, MD;  Location: [place];  Service: Vascular;  Laterality: Left;  . CHOLECYSTECTOMY  02/12/07  . INSERTION OF DIALYSIS CATHETER Right 05/27/2014   Procedure: INSERTION OF DIALYSIS CATHETER;  Surgeon: Angelia Mould, MD;  Location: [employer];  Service: Vascular;  Laterality: Right;  . REFRACTIVE SURGERY  07/29/09   Dr. Bing Plume  . TONSILLECTOMY      OB History    No data available       Home Medications    Prior to Admission medications   Medication Sig Start Date End Date Taking? Authorizing Provider  acetaminophen (TYLENOL) 325 MG tablet Take 650 mg by mouth every 6 (six) hours as needed for moderate pain.     [provider]  Amino Acids-Protein Hydrolys (FEEDING SUPPLEMENT, PRO-STAT SUGAR FREE 64,) LIQD Take 30 mLs by mouth 2 (two) times daily. Patient not taking: Reported on 11/13/2017 11/11/17   Jani Gravel, MD  amLODipine (NORVASC) 10 MG tablet  TAKE 1 TABLET BY MOUTH EVERY DAY 12/30/15   Carollee Herter, Alferd Apa, DO  calcitRIOL (ROCALTROL) 0.5 MCG capsule Take 3 capsules (1.5 mcg total) by mouth 3 (three) times a week. 11/13/17   Jani Gravel, MD  carvedilol (COREG) 25 MG tablet TAKE 1 TABLET (25 MG TOTAL) BY MOUTH 2 (TWO) TIMES DAILY WITH A MEAL. 09/19/17   Roma Schanz R, DO  ferrous sulfate 325 (65 FE) MG tablet Take 325-650 mg by mouth daily with breakfast.    [provider]  furosemide (LASIX) 40 MG tablet TAKE 1 TABLET BY MOUTH EVERY DAY Patient not taking: Reported on 11/13/2017  09/19/17   Ann Held, DO  isosorbide mononitrate (IMDUR) 30 MG 24 hr tablet Take 1 tablet (30 mg total) by mouth 2 (two) times daily. Patient not taking: Reported on 11/13/2017 07/07/17   Carollee Herter, Alferd Apa, DO  lisinopril (PRINIVIL,ZESTRIL) 40 MG tablet Take 1 tablet (40 mg total) by mouth at bedtime. 09/27/17   Ann Held, DO  Multiple Vitamin (MULTIVITAMIN WITH MINERALS) TABS tablet Take 2 tablets by mouth daily.    [provider]    Family History Family History  Problem Relation Age of Onset  . Coronary artery disease Other   . Diabetes Other   . Hypertension Other   . Arthritis Other     Social History Social History   Tobacco Use  . Smoking status: Never Smoker  . Smokeless tobacco: Never Used  . Tobacco comment: Married x's 7yrs, 6 kids-scattered [employer]. Retired Consulting civil engineer  Substance Use Topics  . Alcohol use: No  . Drug use: No     Allergies   Hydralazine and Robaxin [methocarbamol]   Review of Systems Review of Systems  Unable to perform ROS: Mental status change     Physical Exam Updated Vital Signs BP (!) 190/51   Pulse 100   Temp (!) 101.7 F (38.7 C) (Oral)   Resp 16   SpO2 100%   Physical Exam  Constitutional: She appears well-developed and well-nourished. She appears lethargic.  Non-toxic appearance. No distress.  HENT:  Head: Normocephalic and atraumatic.  Eyes: Conjunctivae, EOM and lids are normal. Pupils are equal, round, and reactive to light.  Neck: Normal range of motion. Neck supple. No tracheal deviation present. No thyroid mass present.  Cardiovascular: Normal rate, regular rhythm and normal heart sounds. Exam reveals no gallop.  No murmur heard. Pulmonary/Chest: Effort normal and breath sounds normal. No stridor. No respiratory distress. She has no decreased breath sounds. She has no wheezes. She has no rhonchi. She has no rales.  Abdominal: Soft. Normal appearance and bowel sounds are normal.  She exhibits no distension. There is no tenderness. There is no rebound and no CVA tenderness.  Musculoskeletal: Normal range of motion. She exhibits no edema or tenderness.  Neurological: She appears lethargic. She is disoriented. She displays atrophy. No cranial nerve deficit. GCS eye subscore is 4. GCS verbal subscore is 4. GCS motor subscore is 5.  Skin: Skin is warm and dry. No abrasion and no rash noted.  Psychiatric: Her affect is inappropriate. Her speech is delayed and tangential. She is withdrawn.  Nursing note and vitals reviewed.    ED Treatments / Results  Labs (all labs ordered are listed, but only abnormal results are displayed) Labs Reviewed  CBG MONITORING, ED - Abnormal; Notable for the following components:      Result Value   Glucose-Capillary 100 (*)  All other components within normal limits  CULTURE, BLOOD (ROUTINE X 2)  CULTURE, BLOOD (ROUTINE X 2)  COMPREHENSIVE METABOLIC PANEL  CBC WITH DIFFERENTIAL/PLATELET  URINALYSIS, ROUTINE W REFLEX MICROSCOPIC  I-STAT CG4 LACTIC ACID, ED    EKG  EKG Interpretation None       Radiology No results found.  Procedures Procedures (including critical care time)  Medications Ordered in ED Medications - No data to display   Initial Impression / Assessment and Plan / ED Course  I have reviewed the triage vital signs and the nursing notes.  Pertinent labs & imaging results that were available during my care of the patient were reviewed by me and considered in my medical decision making (see chart for details).  Sepsis order set used and patient started on empiric antibiotics.  Chest x-ray without acute infiltrate.  Urinalysis negative for infection.  Will admit to the hospitalist service  Final Clinical Impressions(s) / ED Diagnoses   Final diagnoses:  None    ED Discharge Orders    None       Lacretia Leigh, MD 11/15/17 1357

## 2017-11-15 NOTE — ED Notes (Signed)
Pt and family informed RN that pt is unable to drink water or fluids b/c "it chokes me."  RN verified medication could be crushed and placed in applesause.  RN paged provider to inform them.

## 2017-11-15 NOTE — ED Notes (Addendum)
Pt declining flu swab.  This nurse tried to get swab but patient continued to jerk away from the swab.

## 2017-11-15 NOTE — Progress Notes (Signed)
Pt admitted to the unit with family. Pt is stable, alert and oriented per baseline. Oriented to room, staff, and call bell. Educated to call for any assistance. Bed in lowest position, call bell within reach- will continue to monitor.

## 2017-11-15 NOTE — H&P (Signed)
History and Physical    [name] [name] NTI:[id number] DOB: Jun 05, 1941 DOA: 11/15/2017   PCP: Ann Held, DO   Patient coming from:  Home    Chief Complaint: Altered mental status and fever  HPI: [name] [name] is a 77 y.o. female with medical history significant for ESRD on hemodialysis Monday Wednesday and Friday, last today 11/15/2016 (incomplete, 20 minutes prior to finishing ).  Normocytic anemia, history of cirrhosis, Hep B carrier diabetes, hypertension, brought from the dialysis center with acute onset of altered mental status.  The patient as mentioned above, had 20 minutes left in her dialysis treatment, when she became more confused.  Her baseline mental status is unknown.  On presentation, she was not warm to the touch and EMS reported that her temperature was 103.7. The patient had recently been admitted from 1/8 through 11/11/2017 for acute hypoxic respiratory failure likely secondary to acute pulmonary edema due to fluid overload, requiring BiPAP.  At the time, she had urgent hemodialysis, but no evidence of infection. Other information cannot be obtained, as the patient is level 5 caveat due to confusion.  Her family member who is with her in the room, denies the patient had been having any sick contacts, shortness of breath, she does have a nonproductive cough.  Her family member denies the patient complaining of chest pain or palpitations.  He denies the patient having any nausea or vomiting.  No worsening of her lower extremity swelling.   ED Course:  BP (!) 152/96   Pulse 89   Temp (!) 100.4 F (38 C) (Rectal)   Resp (!) 25   Ht 5\' 5"  (1.651 m)   Wt 58.5 kg (129 lb)   SpO2 100%   BMI 21.47 kg/m   She was given 1 dose of vancomycin and Zosyn.  Blood cultures were drawn prior to administration of these antibiotics.  She did not receive IV fluids, due to incomplete dialysis. The patient continued to be confused, but overall, other than fever, her vital signs were  stable, and her O2 sats are normal at room air. Bicarb 29, sodium 136, potassium 3.5, creatinine 2.8 see, ALT 34, AST 57. Total bilirubin 0.9. Lactic acid was 1.26, with repeat 1.07. White count is 4, hemoglobin 9.9, improved from prior.  Platelets 170. Chest x-ray without pulmonary edema, or pneumonia or any acute abnormality.   Review of Systems:  As per HPI otherwise all other systems reviewed and are negative  Past Medical History:  Diagnosis Date  . DEPRESSION   . DIABETES MELLITUS, TYPE I   . GERD   . GLAUCOMA   . Headache(784.0)   . Hepatitis B carrier ([place])    05/2009: neg Hep C; Hep B: core pos, Surf neg; fatty liver US 8/10 - 7/13  . HYPERLIPIDEMIA   . HYPERTENSION   . Kidney failure    dialysis 3 times per week  . OSTEOPENIA   . POSTMENOPAUSAL STATUS   . Pulmonary edema   . Unspecified vitamin D deficiency     Past Surgical History:  Procedure Laterality Date  . AV FISTULA PLACEMENT Left 05/29/2014   Procedure: INSERTION OF ARTERIOVENOUS (AV) GORE-TEX GRAFT ARM-LEFT;  Surgeon: Angelia Mould, MD;  Location: [facility];  Service: Vascular;  Laterality: Left;  . CHOLECYSTECTOMY  02/12/07  . INSERTION OF DIALYSIS CATHETER Right 05/27/2014   Procedure: INSERTION OF DIALYSIS CATHETER;  Surgeon: Angelia Mould, MD;  Location: [facility];  Service: Vascular;  Laterality: Right;  .  REFRACTIVE SURGERY  07/29/09   Dr. Bing Plume  . TONSILLECTOMY      Social History Social History   Socioeconomic History  . Marital status: Married    Spouse name: Not on file  . Number of children: Not on file  . Years of education: Not on file  . Highest education level: Not on file  Social Needs  . Financial resource strain: Not on file  . Food insecurity - worry: Not on file  . Food insecurity - inability: Not on file  . Transportation needs - medical: Not on file  . Transportation needs - non-medical: Not on file  Occupational History  . Not on file  Tobacco Use  . Smoking  status: Never Smoker  . Smokeless tobacco: Never Used  . Tobacco comment: Married x's 56yrs, 6 kids-scattered [employer]. Retired Consulting civil engineer  Substance and Sexual Activity  . Alcohol use: No  . Drug use: No  . Sexual activity: No  Other Topics Concern  . Not on file  Social History Narrative  . Not on file     Allergies  Allergen Reactions  . Hydralazine Itching  . Robaxin [Methocarbamol]     Dizziness     Family History  Problem Relation Age of Onset  . Coronary artery disease Other   . Diabetes Other   . Hypertension Other   . Arthritis Other       Prior to Admission medications   Medication Sig Start Date End Date Taking? Authorizing Provider  acetaminophen (TYLENOL) 325 MG tablet Take 650 mg by mouth every 6 (six) hours as needed for moderate pain.     [provider]  Amino Acids-Protein Hydrolys (FEEDING SUPPLEMENT, PRO-STAT SUGAR FREE 64,) LIQD Take 30 mLs by mouth 2 (two) times daily. Patient not taking: Reported on 11/13/2017 11/11/17   Jani Gravel, MD  amLODipine (NORVASC) 10 MG tablet TAKE 1 TABLET BY MOUTH EVERY DAY 12/30/15   Carollee Herter, Alferd Apa, DO  calcitRIOL (ROCALTROL) 0.5 MCG capsule Take 3 capsules (1.5 mcg total) by mouth 3 (three) times a week. 11/13/17   Jani Gravel, MD  carvedilol (COREG) 25 MG tablet TAKE 1 TABLET (25 MG TOTAL) BY MOUTH 2 (TWO) TIMES DAILY WITH A MEAL. 09/19/17   Roma Schanz R, DO  ferrous sulfate 325 (65 FE) MG tablet Take 325-650 mg by mouth daily with breakfast.    [provider]  furosemide (LASIX) 40 MG tablet TAKE 1 TABLET BY MOUTH EVERY DAY Patient not taking: Reported on 11/13/2017 09/19/17   Ann Held, DO  isosorbide mononitrate (IMDUR) 30 MG 24 hr tablet Take 1 tablet (30 mg total) by mouth 2 (two) times daily. Patient not taking: Reported on 11/13/2017 07/07/17   Carollee Herter, Alferd Apa, DO  lisinopril (PRINIVIL,ZESTRIL) 40 MG tablet Take 1 tablet (40 mg total) by mouth at bedtime. 09/27/17    Ann Held, DO  Multiple Vitamin (MULTIVITAMIN WITH MINERALS) TABS tablet Take 2 tablets by mouth daily.    [provider]    Physical Exam:  Vitals:   11/15/17 1400 11/15/17 1500 11/15/17 1500 11/15/17 1530  BP: (!) 163/61 (!) 157/47 (!) 157/57 (!) 152/96  Pulse: 83  88 89  Resp: 19 17 15  (!) 25  Temp:      TempSrc:      SpO2: 98%  100% 100%  Weight:      Height:       Constitutional: NAD,, the patient is confused, but  unable to determine her baseline.  There may be underlying dementia. Eyes: PERRL, lids and conjunctivae normal.  Sclerae dry ENMT: Mucous membranes are moist, without exudate or lesions  Neck: normal, supple, no masses, no thyromegaly Respiratory: clear to auscultation bilaterally, no wheezing, no crackles. Normal respiratory effort  Cardiovascular: Regular rate and rhythm, 3/6 high pitch  murmur, rubs or gallops.  Bilateral pedal and lower pretibial 1+ edema. 2+ pedal pulses. No carotid bruits.  Abdomen: Soft, non tender, No hepatosplenomegaly. Bowel sounds positive.  Musculoskeletal: no clubbing / cyanosis. Moves all extremities Skin: no jaundice, No lesions.  Neurologic: Sensation intact  Strength equal in all extremities but the patient is hard to follow on directions.    Labs on Admission: I have personally reviewed following labs and imaging studies  CBC: Recent Labs  Lab 11/09/17 1124 11/10/17 0641 11/11/17 0343 11/15/17 1147  WBC 3.3* 2.4* 2.6* 4.0  NEUTROABS  --   --   --  3.0  HGB 7.5* 7.1* 7.4* 9.9*  HCT 24.1* 23.7* 24.4* 31.3*  MCV 96.8 97.1 98.0 96.9  PLT 138* 125* 135* 287    Basic Metabolic Panel: Recent Labs  Lab 11/10/17 0641 11/15/17 1147  NA 136 136  K 4.4 3.5  CL 98* 93*  CO2 28 29  GLUCOSE 83 106*  BUN 52* 12  CREATININE 5.27* 2.86*  CALCIUM 9.1 8.6*  PHOS 4.3  --     GFR: Estimated Creatinine Clearance: 15.1 mL/min (A) (by C-G formula based on SCr of 2.86 mg/dL (H)).  Liver Function  Tests: Recent Labs  Lab 11/10/17 0641 11/15/17 1147  AST  --  57*  ALT  --  34  ALKPHOS  --  132*  BILITOT  --  0.9  PROT  --  8.2*  ALBUMIN 2.5* 3.2*   No results for input(s): LIPASE, AMYLASE in the last 168 hours. No results for input(s): AMMONIA in the last 168 hours.  Coagulation Profile: No results for input(s): INR, PROTIME in the last 168 hours.  Cardiac Enzymes: No results for input(s): CKTOTAL, CKMB, CKMBINDEX, TROPONINI in the last 168 hours.  BNP (last 3 results) No results for input(s): PROBNP in the last 8760 hours.  HbA1C: No results for input(s): HGBA1C in the last 72 hours.  CBG: Recent Labs  Lab 11/10/17 0745 11/10/17 1656 11/10/17 2133 11/11/17 0755 11/15/17 1149  GLUCAP 90 90 111* 146* 100*    Lipid Profile: No results for input(s): CHOL, HDL, LDLCALC, TRIG, CHOLHDL, LDLDIRECT in the last 72 hours.  Thyroid Function Tests: No results for input(s): TSH, T4TOTAL, FREET4, T3FREE, THYROIDAB in the last 72 hours.  Anemia Panel: No results for input(s): VITAMINB12, FOLATE, FERRITIN, TIBC, IRON, RETICCTPCT in the last 72 hours.  Urine analysis:    Component Value Date/Time   COLORURINE YELLOW 11/15/2017 1310   APPEARANCEUR CLEAR 11/15/2017 1310   LABSPEC 1.015 11/15/2017 1310   PHURINE 7.0 11/15/2017 1310   GLUCOSEU NEGATIVE 11/15/2017 1310   HGBUR NEGATIVE 11/15/2017 1310   BILIRUBINUR NEGATIVE 11/15/2017 1310   KETONESUR NEGATIVE 11/15/2017 1310   PROTEINUR 100 (A) 11/15/2017 1310   UROBILINOGEN 2.0 (H) 04/01/2008 1751   NITRITE NEGATIVE 11/15/2017 1310   LEUKOCYTESUR NEGATIVE 11/15/2017 1310    Sepsis Labs: @LABRCNTIP (procalcitonin:4,lacticidven:4) ) Recent Results (from the past 240 hour(s))  MRSA PCR Screening     Status: None   Collection Time: 11/07/17 10:34 PM  Result Value Ref Range Status   MRSA by PCR NEGATIVE NEGATIVE Final  Comment:        The GeneXpert MRSA Assay (FDA approved for NASAL specimens only), is one  component of a comprehensive MRSA colonization surveillance program. It is not intended to diagnose MRSA infection nor to guide or monitor treatment for MRSA infections.      Radiological Exams on Admission: Dg Chest Port 1 View  Result Date: 11/15/2017 CLINICAL DATA:  Increased cough for the past several days without other symptoms. History of diabetes, dialysis dependent renal failure, NSTEMI EXAM: PORTABLE CHEST 1 VIEW COMPARISON:  Chest x-ray of November 07, 2017 FINDINGS: The lungs are adequately inflated. The interstitial markings have normalized. The cardiac silhouette remains enlarged. The pulmonary vascularity is not engorged. There calcification in the wall of the aortic arch. The mediastinum is normal in width. The bony thorax is normal where visualized. IMPRESSION: No pulmonary edema, pneumonia, nor other acute abnormality. Stable enlargement of the cardiac silhouette. Thoracic aortic atherosclerosis. Electronically Signed   By: David  Martinique M.D.   On: 11/15/2017 12:39    EKG: Independently reviewed.  Assessment/Plan Active Problems:   Sepsis ([place])   Fever in adult   Type II diabetes mellitus with nephropathy (HCC)   Vitamin D deficiency   Hyperlipidemia LDL goal <70   Unspecified glaucoma   Essential hypertension   GERD   CARDIAC MURMUR   Hepatitis B carrier ([place])   NSTEMI (non-ST elevated myocardial infarction) (HCC)   End stage renal disease (HCC)   Chronic diastolic heart failure (HCC)   Normocytic anemia       Acute Confusional State and fever of unknown origin  No seizures noted Lactic acid was 1.26, with repeat 1.07.White count is 4, bilirubin is normal. Bicarb 29  Chest x-ray unrevealing.  Patient received 1 dose of vancomycin and Zosyn per pharmacy.  Doubt sepsis.  Patient refused influenza panel.  Patient has frequent coughing. Telemetry observation Lactic acid Follow blood culture Hold sedative medications Respiratory virus panel. We will hold  antibiotics for now Empiric antibiotics Procalcitonin Sputum cultures  Legionella Supportive therapy with antipyretics Check TSH Will hold IV fluids, due to patient's history of ESRD on hemodialysis.   Type II Diabetes Current blood sugar level is 100 Lab Results  Component Value Date   HGBA1C 4.7 (L) 11/26/2016  Hgb A1C SSI   Hypertension BP  152/96   Pulse 89   Continue home anti-hypertensive medications   Anemia of chronic disease Hemoglobin on admission 9.9. At baseline   Repeat CBC in am   Continue Iron supplements No transfusion is indicated at this time.   ESRD on HD MWF last today  Cr 2.86 after hemodialysis was not completed, 20 minutes prior to finishing, as the patient became more confused. If patient remains in the hospital will notify nephrology of her admission, to proceed with further dialysis. Renal Diet. Check BMET in am   Diastolic CHF: No acute decompensation weight 129 lbs. Recent echo 10/2017 EF 55-60 percent , and NM results in 10/2017 neg for ischemia  Obtain daily weights Monitor intake and output Continue meds including Coreg, Norvasc, ACE I as patient will not have HD till Friday     DVT prophylaxis: Heparin Code Status:    Full Family Communication:  Discussed with patient's husband Disposition Plan: Expect patient to be discharged to home after condition improves Consults called:    None Admission status: Telemetry observation   Sharene Butters, PA-C [facility]   Amion text  [phone]   11/15/2017, 4:06 PM

## 2017-11-15 NOTE — ED Triage Notes (Signed)
PT was having her dialysis treatment when she became altered. PT's baseline is A&O x4. Per husband, PT has had a cough recently. PT is only oriented to self. PT answers "yes" to all questions per EMS. PT is febrile. PT still has dialysis access in left arm. PT nearly completed her treatment, she had 20 minutes left.

## 2017-11-15 NOTE — Progress Notes (Signed)
Pharmacy Antibiotic Note  [name] [name] is a 77 y.o. female admitted on 11/15/2017 with sepsis.    Plan: Zosyn 3.375 gm iv q12h Vanc 1250 mg iv x 1 F/u HD schedule for further vanc Monitor cx vanc lvls prn  Height: 5\' 5"  (165.1 cm) Weight: 129 lb (58.5 kg) IBW/kg (Calculated) : 57  Temp (24hrs), Avg:101.1 F (38.4 C), Min:100.4 F (38 C), Max:101.7 F (38.7 C)  Recent Labs  Lab 11/09/17 1124 11/10/17 0641 11/11/17 0343 11/15/17 1147 11/15/17 1206  WBC 3.3* 2.4* 2.6* 4.0  --   CREATININE  --  5.27*  --  2.86*  --   LATICACIDVEN  --   --   --   --  1.26    Estimated Creatinine Clearance: 15.1 mL/min (A) (by C-G formula based on SCr of 2.86 mg/dL (H)).    Allergies  Allergen Reactions  . Hydralazine Itching  . Robaxin [Methocarbamol]     Dizziness     Levester Fresh, PharmD, BCPS, BCCCP Clinical Pharmacist Clinical phone for 11/15/2017 from 7a-3:30p: [phone] If after 3:30p, please call main pharmacy at: [phone] 11/15/2017 1:45 PM

## 2017-11-16 ENCOUNTER — Inpatient Hospital Stay: Payer: Medicare HMO | Admitting: Family Medicine

## 2017-11-16 DIAGNOSIS — G9341 Metabolic encephalopathy: Secondary | ICD-10-CM | POA: Diagnosis present

## 2017-11-16 DIAGNOSIS — J111 Influenza due to unidentified influenza virus with other respiratory manifestations: Secondary | ICD-10-CM

## 2017-11-16 LAB — BASIC METABOLIC PANEL
Anion gap: 14 (ref 5–15)
BUN: 28 mg/dL — AB (ref 6–20)
CALCIUM: 8.6 mg/dL — AB (ref 8.9–10.3)
CO2: 28 mmol/L (ref 22–32)
Chloride: 92 mmol/L — ABNORMAL LOW (ref 101–111)
Creatinine, Ser: 5.26 mg/dL — ABNORMAL HIGH (ref 0.44–1.00)
GFR calc Af Amer: 8 mL/min — ABNORMAL LOW (ref >60)
GFR, EST NON AFRICAN AMERICAN: 7 mL/min — AB (ref >60)
GLUCOSE: 87 mg/dL (ref 65–99)
POTASSIUM: 4.1 mmol/L (ref 3.5–5.1)
Sodium: 134 mmol/L — ABNORMAL LOW (ref 135–145)

## 2017-11-16 LAB — RESPIRATORY PANEL BY PCR
Adenovirus: NOT DETECTED
Bordetella pertussis: NOT DETECTED
CORONAVIRUS OC43-RVPPCR: NOT DETECTED
Chlamydophila pneumoniae: NOT DETECTED
Coronavirus 229E: NOT DETECTED
Coronavirus HKU1: NOT DETECTED
Coronavirus NL63: NOT DETECTED
INFLUENZA A H1 2009-RVPPR: DETECTED — AB
INFLUENZA B-RVPPCR: NOT DETECTED
METAPNEUMOVIRUS-RVPPCR: NOT DETECTED
Mycoplasma pneumoniae: NOT DETECTED
PARAINFLUENZA VIRUS 1-RVPPCR: NOT DETECTED
PARAINFLUENZA VIRUS 2-RVPPCR: NOT DETECTED
PARAINFLUENZA VIRUS 4-RVPPCR: NOT DETECTED
Parainfluenza Virus 3: NOT DETECTED
RESPIRATORY SYNCYTIAL VIRUS-RVPPCR: NOT DETECTED
Rhinovirus / Enterovirus: NOT DETECTED

## 2017-11-16 LAB — CBC
HEMATOCRIT: 29 % — AB (ref 36.0–46.0)
Hemoglobin: 8.8 g/dL — ABNORMAL LOW (ref 12.0–15.0)
MCH: 29.4 pg (ref 26.0–34.0)
MCHC: 30.3 g/dL (ref 30.0–36.0)
MCV: 97 fL (ref 78.0–100.0)
Platelets: 153 10*3/uL (ref 150–400)
RBC: 2.99 MIL/uL — ABNORMAL LOW (ref 3.87–5.11)
RDW: 21.2 % — AB (ref 11.5–15.5)
WBC: 2.8 10*3/uL — ABNORMAL LOW (ref 4.0–10.5)

## 2017-11-16 LAB — INFLUENZA PANEL BY PCR (TYPE A & B)
INFLBPCR: NEGATIVE
Influenza A By PCR: POSITIVE — AB

## 2017-11-16 LAB — GLUCOSE, CAPILLARY
GLUCOSE-CAPILLARY: 87 mg/dL (ref 65–99)
Glucose-Capillary: 83 mg/dL (ref 65–99)

## 2017-11-16 MED ORDER — OSELTAMIVIR PHOSPHATE 30 MG PO CAPS: 30.0000 mg | ORAL_CAPSULE | ORAL | Status: DC

## 2017-11-16 MED ORDER — OSELTAMIVIR PHOSPHATE 30 MG PO CAPS
30.0000 mg | ORAL_CAPSULE | Freq: Once | ORAL | Status: AC
  Administered 2017-11-16: 30 mg via ORAL
  Filled 2017-11-16: qty 1

## 2017-11-16 MED ORDER — OSELTAMIVIR PHOSPHATE 75 MG PO CAPS: 75.0000 mg | ORAL_CAPSULE | Freq: Two times a day (BID) | ORAL | Status: DC

## 2017-11-16 MED ORDER — OSELTAMIVIR PHOSPHATE 30 MG PO CAPS: 30.0000 mg | ORAL_CAPSULE | ORAL | 0 refills | Status: AC

## 2017-11-16 NOTE — Progress Notes (Signed)
Discharge instructions discussed and reviewed with pt and husband at bedside, verbalized understanding. Copy of AVS given. Pt was escorted out of the unit in wheelchair, took all belongings with her.

## 2017-11-16 NOTE — Care Management Note (Signed)
Case Management Note  Patient Details  Name: SCARLETT PORTLOCK MRN: [id number] Date of Birth: 07/29/1941  Subjective/Objective:  77 yr old female admitted with altered mental status.Patient is ESRD and on Hemodialysis.            Action/Plan: Case manager spoke with patient and her husband concerning discharge plan. Patient has refused SNF for shortterm rehab. Case manager offered choice for [facility], referral was called to Neoma Laming, [facility] Liaison. Patient says she has rolling walker and 3in1, will have family support at discharge.   Expected Discharge Date:  11/16/17               Expected Discharge Plan:  [place]  In-House Referral:  NA  Discharge planning Services  CM Consult  Post Acute Care Choice:  Home Health Choice offered to:  Spouse, Patient  DME Arranged:  N/A(has RW and 3in1) DME Agency:  NA  HH Arranged:  RN, PT, OT, Nurse's Aide, Social Work [employer] Agency:  [facility]  Status of Service:  Completed, signed off  If discussed at [employer], dates discussed:    Additional Comments:  Ninfa Meeker, RN 11/16/2017, 2:19 PM

## 2017-11-16 NOTE — Discharge Summary (Addendum)
Physician Discharge Summary  [name] [name] WUJ:[id number] DOB: 1941/06/25 DOA: 11/15/2017  PCP: Ann Held, DO  Admit date: 11/15/2017 Discharge date: 11/16/2017  Admitted From: Home Disposition:  Declined SNF, home with home health services  Recommendations for Outpatient Follow-up:  1. Follow up with PCP in 1 week 2. ESRD as usual MWF  3. Please follow up on the following pending results: final blood culture results   Discharge Condition: Stable CODE STATUS: Full  Diet recommendation: Renal diet   Brief/Interim Summary: HPI: [name] [name] is a 77 y.o. female with medical history significant for ESRD on hemodialysis Monday Wednesday and Friday, last 11/15/2016 (incomplete, 20 minutes prior to finishing), normocytic anemia, history of cirrhosis, Hep B carrier, diabetes, hypertension, brought from the dialysis center with acute onset of altered mental status.  The patient as mentioned above, had 20 minutes left in her dialysis treatment, when she became more confused.  On presentation, EMS reported that her temperature was 103.7. The patient had recently been admitted from 1/8 through 11/11/2017 for acute hypoxic respiratory failure likely secondary to acute pulmonary edema due to fluid overload, requiring BiPAP.  At the time, she had urgent hemodialysis, but no evidence of infection. Her family member who is with her in the room, denies the patient had been having any sick contacts, shortness of breath, she does have a nonproductive cough.    ED Course:  BP (!) 152/96   Pulse 89   Temp (!) 100.4 F (38 C) (Rectal)   Resp (!) 25   Ht 5\' 5"  (1.651 m)   Wt 58.5 kg (129 lb)   SpO2 100%   BMI 21.47 kg/m   She was given 1 dose of vancomycin and Zosyn.  Blood cultures were drawn prior to administration of these antibiotics.  She did not receive IV fluids, due to incomplete dialysis. The patient continued to be confused, but overall, other than fever, her vital signs were stable,  and her O2 sats are normal at room air. Bicarb 29, sodium 136, potassium 3.5, creatinine 2.8 see, ALT 34, AST 57. Total bilirubin 0.9. Lactic acid was 1.26, with repeat 1.07. White count is 4, hemoglobin 9.9, improved from prior.  Platelets 170. Chest x-ray without pulmonary edema, or pneumonia or any acute abnormality.  Interim course: Influenza swab returned positive. She was started on renally dose Tamiflu.  On morning of discharge, patient's mentation had returned back to baseline, with no new complaints.  She was eating breakfast with significant other at bedside.  She will return home with Tamiflu.  She is instructed to continue supportive care at home.   Discharge Diagnoses:  Principal Problem:   Acute metabolic encephalopathy Active Problems:   Type II diabetes mellitus with nephropathy (HCC)   Hyperlipidemia LDL goal <70   Unspecified glaucoma   Essential hypertension   GERD   CARDIAC MURMUR   Hepatitis B carrier ([place])   NSTEMI (non-ST elevated myocardial infarction) (HCC)   End stage renal disease (HCC)   Chronic diastolic heart failure (HCC)   Normocytic anemia   Fever in adult   Influenza   Discharge Instructions  Discharge Instructions    Call MD for:  difficulty breathing, headache or visual disturbances   Complete by:  As directed    Call MD for:  extreme fatigue   Complete by:  As directed    Call MD for:  hives   Complete by:  As directed    Call MD for:  persistant dizziness  or light-headedness   Complete by:  As directed    Call MD for:  persistant nausea and vomiting   Complete by:  As directed    Call MD for:  severe uncontrolled pain   Complete by:  As directed    Call MD for:  temperature >100.4   Complete by:  As directed    Discharge instructions   Complete by:  As directed    You were cared for by a hospitalist during your hospital stay. If you have any questions about your discharge medications or the care you received while you were in the  hospital after you are discharged, you can call the unit and asked to speak with the hospitalist on call if the hospitalist that took care of you is not available. Once you are discharged, your primary care physician will handle any further medical issues. Please note that NO REFILLS for any discharge medications will be authorized once you are discharged, as it is imperative that you return to your primary care physician (or establish a relationship with a primary care physician if you do not have one) for your aftercare needs so that they can reassess your need for medications and monitor your lab values.   Increase activity slowly   Complete by:  As directed      Allergies as of 11/16/2017      Reactions   Hydralazine Itching   Robaxin [methocarbamol]    Dizziness       Medication List    TAKE these medications   amLODipine 10 MG tablet Commonly known as:  NORVASC TAKE 1 TABLET BY MOUTH EVERY DAY   calcitRIOL 0.5 MCG capsule Commonly known as:  ROCALTROL Take 3 capsules (1.5 mcg total) by mouth 3 (three) times a week.   carvedilol 25 MG tablet Commonly known as:  COREG TAKE 1 TABLET (25 MG TOTAL) BY MOUTH 2 (TWO) TIMES DAILY WITH A MEAL.   ferrous sulfate 325 (65 FE) MG tablet Take 325-650 mg by mouth daily with breakfast.   furosemide 40 MG tablet Commonly known as:  LASIX TAKE 1 TABLET BY MOUTH EVERY DAY   isosorbide mononitrate 30 MG 24 hr tablet Commonly known as:  IMDUR Take 1 tablet (30 mg total) by mouth 2 (two) times daily.   lisinopril 40 MG tablet Commonly known as:  PRINIVIL,ZESTRIL Take 1 tablet (40 mg total) by mouth at bedtime.   multivitamin with minerals Tabs tablet Take 1 tablet by mouth daily.   oseltamivir 30 MG capsule Commonly known as:  TAMIFLU Take 1 capsule (30 mg total) by mouth every Monday, Wednesday, and Friday with hemodialysis for 3 days. Start taking on:  11/17/2017      Follow-up Information    Ann Held, DO. Schedule  an appointment as soon as possible for a visit in 1 week(s).   Specialty:  Family Medicine Contact information: [phone] [address] [phone]        Josue Hector, MD .   Specialty:  Cardiology Contact information: [phone] [place] [address] [phone]          Allergies  Allergen Reactions  . Hydralazine Itching  . Robaxin [Methocarbamol]     Dizziness     Consultations:  None    Procedures/Studies: Dg Chest 2 View  Result Date: 11/07/2017 CLINICAL DATA:  Dyspnea with cough times 1-1/2 months. EXAM: CHEST  2 VIEW COMPARISON:  08/17/2017 FINDINGS: Stable cardiomegaly  with aortic atherosclerosis. Diffuse interval increase in pulmonary edema since prior with trace bilateral pleural effusions blunting the costophrenic angles. Degenerative change noted about the [facility] and glenohumeral joints as well as dorsal spine. Cholecystectomy clips are present. IMPRESSION: Stable cardiomegaly with aortic atherosclerosis. Diffuse interstitial edema consistent CHF. Trace bilateral pleural effusions blunting the costophrenic angles. Electronically Signed   By: Ashley Royalty M.D.   On: 11/07/2017 03:08   Dg Lumbar Spine 2-3 Views  Result Date: 11/07/2017 CLINICAL DATA:  Low back pain. EXAM: LUMBAR SPINE - 2-3 VIEW COMPARISON:  None FINDINGS: Normal alignment of the lumbar spine. The vertebral body heights are well preserved. Multi level ventral endplate spurring is identified with mild disc space narrowing. No acute fractures or subluxations identified. Aortic atherosclerosis noted. IMPRESSION: 1. Multi level lumbar degenerative disc disease. 2.  Aortic Atherosclerosis (ICD10-I70.0). Electronically Signed   By: Kerby Moors M.D.   On: 11/07/2017 19:58   Ct Hip Left Wo Contrast  Result Date: 10/20/2017 CLINICAL DATA:  Left hip pain secondary to a fall yesterday. EXAM: CT OF THE LEFT HIP WITHOUT CONTRAST TECHNIQUE: Multidetector CT imaging  of the left hip was performed according to the standard protocol. Multiplanar CT image reconstructions were also generated. COMPARISON:  Radiographs dated 10/20/2017 FINDINGS: Bones/Joint/Cartilage There is no acute fracture or dislocation. There is mild to moderate osteoarthritis of the left hip with marginal osteophyte formation and subcortical cyst formation in the acetabulum and femoral head. The area of concern on radiographs is normal on the CT scan. There are chronic soft tissue calcifications adjacent to the greater and lesser trochanters. Arterial vascular calcification around the hip. Chronic calcific tendinopathy of the origin of the hamstring tendons. No significant acute soft tissue abnormality. IMPRESSION: 1. No acute abnormality of the bones of the left hip. 2. Arthritic changes and other degenerative changes around the left hip as described. Electronically Signed   By: Lorriane Shire M.D.   On: 10/20/2017 11:50   Nm Myocar Multi W/spect W/wall Motion / Ef  Result Date: 11/10/2017 CLINICAL DATA:  Chest pain. Abnormal EKG. History of hypertension, diabetes and congestive heart failure. EXAM: MYOCARDIAL IMAGING WITH SPECT (REST AND PHARMACOLOGIC-STRESS) GATED LEFT VENTRICULAR WALL MOTION STUDY LEFT VENTRICULAR EJECTION FRACTION TECHNIQUE: Standard myocardial SPECT imaging was performed after resting intravenous injection of 10 mCi Tc-94m tetrofosmin. Subsequently, intravenous infusion of Lexiscan was performed under the supervision of the Cardiology staff. At peak effect of the drug, 30 mCi Tc-53m tetrofosmin was injected intravenously and standard myocardial SPECT imaging was performed. Quantitative gated imaging was also performed to evaluate left ventricular wall motion, and estimate left ventricular ejection fraction. COMPARISON:  Chest radiographs 11/07/2017 and 08/17/2017. FINDINGS: Perfusion: No decreased activity in the left ventricle on stress imaging to suggest reversible ischemia or  infarction. Probable mild breast attenuation in the anterior wall. Wall Motion: Normal left ventricular wall motion. Mild left ventricular dilatation. Left Ventricular Ejection Fraction: 53 % End diastolic volume 191 ml End systolic volume 53 ml IMPRESSION: 1. No reversible ischemia or infarction. 2. Normal left ventricular wall motion. 3. Left ventricular ejection fraction 53% 4. Non invasive risk stratification*: Low *2012 Appropriate Use Criteria for Coronary Revascularization Focused Update: J Am Coll Cardiol. 4782;95(6):213-086. http://content.airportbarriers.com.aspx?articleid=[email or web address] Electronically Signed   By: Richardean Sale M.D.   On: 11/10/2017 13:58   Dg Chest Port 1 View  Result Date: 11/15/2017 CLINICAL DATA:  Increased cough for the past several days without other symptoms. History of diabetes, dialysis dependent renal failure, NSTEMI EXAM: PORTABLE  CHEST 1 VIEW COMPARISON:  Chest x-ray of November 07, 2017 FINDINGS: The lungs are adequately inflated. The interstitial markings have normalized. The cardiac silhouette remains enlarged. The pulmonary vascularity is not engorged. There calcification in the wall of the aortic arch. The mediastinum is normal in width. The bony thorax is normal where visualized. IMPRESSION: No pulmonary edema, pneumonia, nor other acute abnormality. Stable enlargement of the cardiac silhouette. Thoracic aortic atherosclerosis. Electronically Signed   By: David  Martinique M.D.   On: 11/15/2017 12:39   Dg Hip Unilat With Pelvis 2-3 Views Left  Result Date: 10/20/2017 CLINICAL DATA:  Left flank/hip pain after a fall yesterday. Pt states she tripped over curb. EXAM: DG HIP (WITH OR WITHOUT PELVIS) 2-3V LEFT COMPARISON:  05/22/2014 FINDINGS: There is a linear lucency in the trochanteric region on the external rotation view. This raises question of minimally displaced intertrochanteric fracture. No evidence for dislocation. There is extensive atherosclerotic calcification,  showing significant change since 2015. Otherwise the pelvis appears intact. IMPRESSION: Question of left intertrochanteric fracture. CT is recommended for further evaluation. Electronically Signed   By: Nolon Nations M.D.   On: 10/20/2017 09:28       Discharge Exam: Vitals:   11/15/17 2118 11/16/17 0537  BP: (!) 133/43 (!) 132/41  Pulse: 69 76  Resp: 18 16  Temp: 98.9 F (37.2 C) 98.8 F (37.1 C)  SpO2: 96% 97%   Vitals:   11/15/17 1900 11/15/17 2000 11/15/17 2118 11/16/17 0537  BP: (!) 155/62 (!) 131/101 (!) 133/43 (!) 132/41  Pulse: 81 88 69 76  Resp: 17 19 18 16   Temp:   98.9 F (37.2 C) 98.8 F (37.1 C)  TempSrc:   Oral Oral  SpO2: 97% 95% 96% 97%  Weight:   58.4 kg (128 lb 12 oz)   Height:   5' 5.5" (1.664 m)     General: Pt is alert, awake, not in acute distress Cardiovascular: RRR, S1/S2 +, no rubs, no gallops Respiratory: CTA bilaterally, no wheezing, no rhonchi Abdominal: Soft, NT, ND, bowel sounds + Extremities: no edema, no cyanosis    The results of significant diagnostics from this hospitalization (including imaging, microbiology, ancillary and laboratory) are listed below for reference.     Microbiology: Recent Results (from the past 240 hour(s))  MRSA PCR Screening     Status: None   Collection Time: 11/07/17 10:34 PM  Result Value Ref Range Status   MRSA by PCR NEGATIVE NEGATIVE Final    Comment:        The GeneXpert MRSA Assay (FDA approved for NASAL specimens only), is one component of a comprehensive MRSA colonization surveillance program. It is not intended to diagnose MRSA infection nor to guide or monitor treatment for MRSA infections.   Blood Culture (routine x 2)     Status: None (Preliminary result)   Collection Time: 11/15/17 11:47 AM  Result Value Ref Range Status   Specimen Description BLOOD RIGHT ANTECUBITAL  Final   Special Requests   Final    BOTTLES DRAWN AEROBIC AND ANAEROBIC Blood Culture adequate volume   Culture NO  GROWTH < 24 HOURS  Final   Report Status PENDING  Incomplete  Blood Culture (routine x 2)     Status: None (Preliminary result)   Collection Time: 11/15/17 12:11 PM  Result Value Ref Range Status   Specimen Description BLOOD RIGHT FOREARM  Final   Special Requests   Final    BOTTLES DRAWN AEROBIC ONLY Blood Culture adequate volume  Culture NO GROWTH < 24 HOURS  Final   Report Status PENDING  Incomplete  Respiratory Panel by PCR     Status: Abnormal   Collection Time: 11/15/17  7:49 PM  Result Value Ref Range Status   Adenovirus NOT DETECTED NOT DETECTED Final   Coronavirus 229E NOT DETECTED NOT DETECTED Final   Coronavirus HKU1 NOT DETECTED NOT DETECTED Final   Coronavirus NL63 NOT DETECTED NOT DETECTED Final   Coronavirus OC43 NOT DETECTED NOT DETECTED Final   Metapneumovirus NOT DETECTED NOT DETECTED Final   Rhinovirus / Enterovirus NOT DETECTED NOT DETECTED Final   Influenza A H1 2009 DETECTED (A) NOT DETECTED Final   Influenza B NOT DETECTED NOT DETECTED Final   Parainfluenza Virus 1 NOT DETECTED NOT DETECTED Final   Parainfluenza Virus 2 NOT DETECTED NOT DETECTED Final   Parainfluenza Virus 3 NOT DETECTED NOT DETECTED Final   Parainfluenza Virus 4 NOT DETECTED NOT DETECTED Final   Respiratory Syncytial Virus NOT DETECTED NOT DETECTED Final   Bordetella pertussis NOT DETECTED NOT DETECTED Final   Chlamydophila pneumoniae NOT DETECTED NOT DETECTED Final   Mycoplasma pneumoniae NOT DETECTED NOT DETECTED Final     Labs: BNP (last 3 results) Recent Labs    11/07/17 0015  BNP 4,854.6*   Basic Metabolic Panel: Recent Labs  Lab 11/10/17 0641 11/15/17 1147 11/16/17 0759  NA 136 136 134*  K 4.4 3.5 4.1  CL 98* 93* 92*  CO2 28 29 28   GLUCOSE 83 106* 87  BUN 52* 12 28*  CREATININE 5.27* 2.86* PENDING  CALCIUM 9.1 8.6* 8.6*  PHOS 4.3  --   --    Liver Function Tests: Recent Labs  Lab 11/10/17 0641 11/15/17 1147  AST  --  57*  ALT  --  34  ALKPHOS  --  132*   BILITOT  --  0.9  PROT  --  8.2*  ALBUMIN 2.5* 3.2*   No results for input(s): LIPASE, AMYLASE in the last 168 hours. No results for input(s): AMMONIA in the last 168 hours. CBC: Recent Labs  Lab 11/09/17 1124 11/10/17 0641 11/11/17 0343 11/15/17 1147 11/16/17 0759  WBC 3.3* 2.4* 2.6* 4.0 2.8*  NEUTROABS  --   --   --  3.0  --   HGB 7.5* 7.1* 7.4* 9.9* 8.8*  HCT 24.1* 23.7* 24.4* 31.3* 29.0*  MCV 96.8 97.1 98.0 96.9 97.0  PLT 138* 125* 135* 170 153   Cardiac Enzymes: No results for input(s): CKTOTAL, CKMB, CKMBINDEX, TROPONINI in the last 168 hours. BNP: Invalid input(s): POCBNP CBG: Recent Labs  Lab 11/11/17 0755 11/15/17 1149 11/15/17 1650 11/15/17 2124 11/16/17 0805  GLUCAP 146* 100* 78 97 83   D-Dimer No results for input(s): DDIMER in the last 72 hours. Hgb A1c Recent Labs    11/15/17 1631  HGBA1C 4.8   Lipid Profile No results for input(s): CHOL, HDL, LDLCALC, TRIG, CHOLHDL, LDLDIRECT in the last 72 hours. Thyroid function studies Recent Labs    11/15/17 1631  TSH 0.536   Anemia work up No results for input(s): VITAMINB12, FOLATE, FERRITIN, TIBC, IRON, RETICCTPCT in the last 72 hours. Urinalysis    Component Value Date/Time   COLORURINE YELLOW 11/15/2017 1310   APPEARANCEUR CLEAR 11/15/2017 1310   LABSPEC 1.015 11/15/2017 1310   PHURINE 7.0 11/15/2017 1310   GLUCOSEU NEGATIVE 11/15/2017 1310   HGBUR NEGATIVE 11/15/2017 1310   BILIRUBINUR NEGATIVE 11/15/2017 1310   KETONESUR NEGATIVE 11/15/2017 1310   PROTEINUR 100 (A) 11/15/2017 1310  UROBILINOGEN 2.0 (H) 04/01/2008 1751   NITRITE NEGATIVE 11/15/2017 1310   LEUKOCYTESUR NEGATIVE 11/15/2017 1310   Sepsis Labs Invalid input(s): PROCALCITONIN,  WBC,  LACTICIDVEN Microbiology Recent Results (from the past 240 hour(s))  MRSA PCR Screening     Status: None   Collection Time: 11/07/17 10:34 PM  Result Value Ref Range Status   MRSA by PCR NEGATIVE NEGATIVE Final    Comment:        The  GeneXpert MRSA Assay (FDA approved for NASAL specimens only), is one component of a comprehensive MRSA colonization surveillance program. It is not intended to diagnose MRSA infection nor to guide or monitor treatment for MRSA infections.   Blood Culture (routine x 2)     Status: None (Preliminary result)   Collection Time: 11/15/17 11:47 AM  Result Value Ref Range Status   Specimen Description BLOOD RIGHT ANTECUBITAL  Final   Special Requests   Final    BOTTLES DRAWN AEROBIC AND ANAEROBIC Blood Culture adequate volume   Culture NO GROWTH < 24 HOURS  Final   Report Status PENDING  Incomplete  Blood Culture (routine x 2)     Status: None (Preliminary result)   Collection Time: 11/15/17 12:11 PM  Result Value Ref Range Status   Specimen Description BLOOD RIGHT FOREARM  Final   Special Requests   Final    BOTTLES DRAWN AEROBIC ONLY Blood Culture adequate volume   Culture NO GROWTH < 24 HOURS  Final   Report Status PENDING  Incomplete  Respiratory Panel by PCR     Status: Abnormal   Collection Time: 11/15/17  7:49 PM  Result Value Ref Range Status   Adenovirus NOT DETECTED NOT DETECTED Final   Coronavirus 229E NOT DETECTED NOT DETECTED Final   Coronavirus HKU1 NOT DETECTED NOT DETECTED Final   Coronavirus NL63 NOT DETECTED NOT DETECTED Final   Coronavirus OC43 NOT DETECTED NOT DETECTED Final   Metapneumovirus NOT DETECTED NOT DETECTED Final   Rhinovirus / Enterovirus NOT DETECTED NOT DETECTED Final   Influenza A H1 2009 DETECTED (A) NOT DETECTED Final   Influenza B NOT DETECTED NOT DETECTED Final   Parainfluenza Virus 1 NOT DETECTED NOT DETECTED Final   Parainfluenza Virus 2 NOT DETECTED NOT DETECTED Final   Parainfluenza Virus 3 NOT DETECTED NOT DETECTED Final   Parainfluenza Virus 4 NOT DETECTED NOT DETECTED Final   Respiratory Syncytial Virus NOT DETECTED NOT DETECTED Final   Bordetella pertussis NOT DETECTED NOT DETECTED Final   Chlamydophila pneumoniae NOT DETECTED NOT  DETECTED Final   Mycoplasma pneumoniae NOT DETECTED NOT DETECTED Final     Time coordinating discharge: 40 minutes  SIGNED:  Dessa Phi, DO [facility] Pager [phone]  If 7PM-7AM, please contact night-coverage [email or web address] Password [facility] 11/16/2017, 9:49 AM

## 2017-11-16 NOTE — Evaluation (Signed)
Physical Therapy Evaluation Patient Details Name: [name] [name] MRN: [id number] DOB: 10/14/1941 Today's Date: 11/16/2017   History of Present Illness  Pt is a 77 y/o female admitted secondary to AMS at HD. PMH includes L AV fistula placement, DM, HTN, hepatits B, osteopenia, NSTEMI, ESRD on HD.   Clinical Impression  Pt admitted secondary to problem above with deficits below. Husband reports pt independent PTA, however, upon eval, pt very lethargic and required min to mod A for mobility. Also demonstrated decreased safety with use of AD throughout mobility, requiring multimodal cues to correct. Educated about need for SNF post acutely, however, husband wanting pt to go home, as he is able to provide 24/7 assist. Will need max HH services if pt and family decide to d/c home. Will continue to follow acutely to maximize functional mobility independence and safety.     Follow Up Recommendations SNF;Supervision/Assistance - 24 hour(family will likely refuse; need max HHservices if refuses)    Equipment Recommendations  Other (comment)(tub bench )    Recommendations for Other Services OT consult     Precautions / Restrictions Precautions Precautions: Fall;Other (comment) Precaution Comments: Droplet  Restrictions Weight Bearing Restrictions: No      Mobility  Bed Mobility Overal bed mobility: Needs Assistance Bed Mobility: Supine to Sit;Sit to Supine     Supine to sit: Mod assist Sit to supine: Mod assist   General bed mobility comments: Mod A for LE assist and trunk elevation. Also required assist to scoot hips to EOB using bed pad. Cues for sequencing as pt was very lethargic. Mod A for LE lift assist to return to supine.   Transfers Overall transfer level: Needs assistance Equipment used: Rolling walker (2 wheeled);None Transfers: Sit to/from Stand Sit to Stand: Min assist;Mod assist         General transfer comment: Min A for lift assist and steadying. When standing  without AD, pt demonstrated posterior lean, requiring mod A to maintain balance. With use of RW, pt requiring min A and was able to correct posture.   Ambulation/Gait Ambulation/Gait assistance: Min assist Ambulation Distance (Feet): 100 Feet Assistive device: Rolling walker (2 wheeled) Gait Pattern/deviations: Step-through pattern;Decreased stride length;Drifts right/left Gait velocity: Decreased  Gait velocity interpretation: Below normal speed for age/gender General Gait Details: Pt unsteady and demonstrated unsafe technique with use of RW. Required continuous cues for positioning within the RW and for upright posture. Also required cues for proximity to device. Pt tended to drift to the L throughout gait and required multimodal cues to correct. Min A throughout for steadying.   Stairs            Wheelchair Mobility    Modified Rankin (Stroke Patients Only)       Balance Overall balance assessment: Needs assistance Sitting-balance support: Feet supported;Bilateral upper extremity supported Sitting balance-Leahy Scale: Poor Sitting balance - Comments: BUE support required for balance and cues for upright secondary to posterior lean.  Postural control: Posterior lean Standing balance support: Bilateral upper extremity supported;During functional activity Standing balance-Leahy Scale: Poor Standing balance comment: REliant on BUE support and external support for steadying throughout.                              Pertinent Vitals/Pain Pain Assessment: No/denies pain    Home Living Family/patient expects to be discharged to:: Private residence Living Arrangements: Spouse/significant other Available Help at Discharge: Family;Available 24 hours/day Type of Home: [place]  Access: Stairs to enter Entrance Stairs-Rails: Right Entrance Stairs-Number of Steps: 3 Home Layout: One level Home Equipment: Walker - 2 wheels;Walker - standard;Cane - single point;Bedside  commode      Prior Function Level of Independence: Needs assistance   Gait / Transfers Assistance Needed: Independent with ambulation   ADL's / Homemaking Assistance Needed: Needed assist with bathing dressing        Hand Dominance   Dominant Hand: Right    Extremity/Trunk Assessment   Upper Extremity Assessment Upper Extremity Assessment: Defer to OT evaluation    Lower Extremity Assessment Lower Extremity Assessment: Generalized weakness    Cervical / Trunk Assessment Cervical / Trunk Assessment: Kyphotic  Communication   Communication: No difficulties  Cognition Arousal/Alertness: Lethargic Behavior During Therapy: WFL for tasks assessed/performed Overall Cognitive Status: Impaired/Different from baseline Area of Impairment: Orientation;Attention;Memory;Following commands;Safety/judgement;Awareness;Problem solving                 Orientation Level: Disoriented to;Situation;Time Current Attention Level: Sustained Memory: Decreased recall of precautions;Decreased short-term memory Following Commands: Follows one step commands with increased time Safety/Judgement: Decreased awareness of safety;Decreased awareness of deficits Awareness: Intellectual Problem Solving: Slow processing;Decreased initiation;Difficulty sequencing;Requires verbal cues;Requires tactile cues General Comments: Pt very sleepy throughout. Demonstrated decreased safety with use of RW throughout session and required multimodal cues to correct.       General Comments General comments (skin integrity, edema, etc.): Pt's husband present throughout session. Educated about need for SNF, however, husband wanting to take pt home. Educated about level of assist he needs to provide at home.     Exercises     Assessment/Plan    PT Assessment Patient needs continued PT services  PT Problem List Decreased strength;Decreased balance;Decreased mobility;Decreased activity tolerance;Decreased  cognition;Decreased coordination;Decreased knowledge of use of DME;Decreased safety awareness;Decreased knowledge of precautions       PT Treatment Interventions DME instruction;Gait training;Stair training;Functional mobility training;Therapeutic activities;Therapeutic exercise;Neuromuscular re-education;Balance training;Patient/family education;Cognitive remediation    PT Goals (Current goals can be found in the Care Plan section)  Acute Rehab PT Goals Patient Stated Goal: to take pt home per husband  PT Goal Formulation: With family Time For Goal Achievement: 11/30/17 Potential to Achieve Goals: Fair    Frequency Min 3X/week   Barriers to discharge        Co-evaluation               AM-PAC PT "6 Clicks" Daily Activity  Outcome Measure Difficulty turning over in bed (including adjusting bedclothes, sheets and blankets)?: Unable Difficulty moving from lying on back to sitting on the side of the bed? : Unable Difficulty sitting down on and standing up from a chair with arms (e.g., wheelchair, bedside commode, etc,.)?: Unable Help needed moving to and from a bed to chair (including a wheelchair)?: A Little Help needed walking in hospital room?: A Little Help needed climbing 3-5 steps with a railing? : A Lot 6 Click Score: 11    End of Session Equipment Utilized During Treatment: Gait belt Activity Tolerance: Patient limited by lethargy     PT Visit Diagnosis: Unsteadiness on feet (R26.81);Muscle weakness (generalized) (M62.81);Difficulty in walking, not elsewhere classified (R26.2)    Time: 2458-0998 PT Time Calculation (min) (ACUTE ONLY): 23 min   Charges:   PT Evaluation $PT Eval Moderate Complexity: 1 Mod PT Treatments $Gait Training: 8-22 mins   PT G Codes:        Leighton Ruff, PT, DPT  Acute Rehabilitation Services  Pager: [phone]  [place] 11/16/2017, 11:53 AM

## 2017-11-17 DIAGNOSIS — N186 End stage renal disease: Secondary | ICD-10-CM | POA: Diagnosis not present

## 2017-11-17 DIAGNOSIS — N2581 Secondary hyperparathyroidism of renal origin: Secondary | ICD-10-CM | POA: Diagnosis not present

## 2017-11-18 LAB — BLOOD CULTURE ID PANEL (REFLEXED)
Acinetobacter baumannii: NOT DETECTED
CANDIDA KRUSEI: NOT DETECTED
Candida albicans: NOT DETECTED
Candida glabrata: NOT DETECTED
Candida parapsilosis: NOT DETECTED
Candida tropicalis: NOT DETECTED
ENTEROCOCCUS SPECIES: NOT DETECTED
ESCHERICHIA COLI: NOT DETECTED
Enterobacter cloacae complex: NOT DETECTED
Enterobacteriaceae species: NOT DETECTED
Haemophilus influenzae: NOT DETECTED
Klebsiella oxytoca: NOT DETECTED
Klebsiella pneumoniae: NOT DETECTED
LISTERIA MONOCYTOGENES: NOT DETECTED
Neisseria meningitidis: NOT DETECTED
PROTEUS SPECIES: NOT DETECTED
Pseudomonas aeruginosa: NOT DETECTED
SERRATIA MARCESCENS: NOT DETECTED
STAPHYLOCOCCUS AUREUS BCID: NOT DETECTED
STAPHYLOCOCCUS SPECIES: NOT DETECTED
STREPTOCOCCUS PNEUMONIAE: NOT DETECTED
STREPTOCOCCUS PYOGENES: NOT DETECTED
Streptococcus agalactiae: NOT DETECTED
Streptococcus species: NOT DETECTED

## 2017-11-18 NOTE — Progress Notes (Signed)
   I was called by microlab this morning, 2 days post-discharge regarding patient's blood culture results. It is showing 1 of 2 bottle gram positive rods. This likely represents contamination.    Dessa Phi, DO [facility] [email or web address] Password [email or web address] 11/18/2017, 8:00 AM

## 2017-11-20 ENCOUNTER — Other Ambulatory Visit: Payer: Self-pay | Admitting: Family Medicine

## 2017-11-20 DIAGNOSIS — N186 End stage renal disease: Secondary | ICD-10-CM | POA: Diagnosis not present

## 2017-11-20 DIAGNOSIS — N184 Chronic kidney disease, stage 4 (severe): Secondary | ICD-10-CM

## 2017-11-20 DIAGNOSIS — N2581 Secondary hyperparathyroidism of renal origin: Secondary | ICD-10-CM | POA: Diagnosis not present

## 2017-11-20 LAB — CULTURE, BLOOD (ROUTINE X 2)
Culture: NO GROWTH
SPECIAL REQUESTS: ADEQUATE

## 2017-11-21 ENCOUNTER — Ambulatory Visit: Payer: Medicare HMO | Admitting: Nurse Practitioner

## 2017-11-21 NOTE — Progress Notes (Deleted)
CARDIOLOGY OFFICE NOTE  Date:  11/21/2017    [name] [name] Date of Birth: 08-11-1941 Medical Record #[id number]  PCP:  Ann Held, DO  Cardiologist:  Johnsie Cancel   No chief complaint on file.   History of Present Illness: [name] [name] is a 77 y.o. female who presents today for a post hospital visit. Seen for Dr. Johnsie Cancel.   She has a history of DM, normocytic anemia, cirrhosis, Hep B carrier, DM, HTN, HLD, aortic stenosis, ESRD on HD and diastolic dysfunction.    She was seen here almost about 10 months ago for diastolic dysfunction noted on echo. Felt to be euvolemic. She has had several episodes of syncope related to her dialysis. Lexiscan was ordered but she did not follow thru.   Admitted last week from the dialysis center with acute onset of altered mental status. She had 20 minutes left in her dialysis treatment, when she became more confused. On presentation, EMS reported that her temperature was 103.7. The patient had recently been admitted from 1/8 through 11/11/2017 for acute hypoxic respiratory failure likely secondary to acute pulmonary edema due to fluid overload, requiring BiPAP.   She was given 1 dose of vancomycin and Zosyn. Blood cultures were drawn prior to administration of these antibiotics. She did not receive IV fluids, due to incomplete dialysis.  She continued to be confused, but overall, other than fever, her vital signs were stable, and her O2 sats are normal at room air.  Chest x-ray without pulmonary edema, or pneumonia or any acute abnormality. Her Influenza swab returned positive. She was started on renally dose Tamiflu.  On morning of discharge, patient's mentation had returned back to baseline, with no new complaints.    No mention of any cardiac complaints noted.   Comes in today. Here with   Past Medical History:  Diagnosis Date  . DEPRESSION   . DIABETES MELLITUS, TYPE I   . GERD   . GLAUCOMA   . Headache(784.0)   . Hepatitis  B carrier ([place])    05/2009: neg Hep C; Hep B: core pos, Surf neg; fatty liver US 8/10 - 7/13  . HYPERLIPIDEMIA   . HYPERTENSION   . Kidney failure    dialysis 3 times per week  . OSTEOPENIA   . POSTMENOPAUSAL STATUS   . Pulmonary edema   . Unspecified vitamin D deficiency     Past Surgical History:  Procedure Laterality Date  . AV FISTULA PLACEMENT Left 05/29/2014   Procedure: INSERTION OF ARTERIOVENOUS (AV) GORE-TEX GRAFT ARM-LEFT;  Surgeon: Angelia Mould, MD;  Location: [facility];  Service: Vascular;  Laterality: Left;  . CHOLECYSTECTOMY  02/12/07  . INSERTION OF DIALYSIS CATHETER Right 05/27/2014   Procedure: INSERTION OF DIALYSIS CATHETER;  Surgeon: Angelia Mould, MD;  Location: [facility];  Service: Vascular;  Laterality: Right;  . REFRACTIVE SURGERY  07/29/09   Dr. Bing Plume  . TONSILLECTOMY       Medications: No outpatient medications have been marked as taking for the 11/21/17 encounter (Appointment) with Burtis Junes, NP.     Allergies: Allergies  Allergen Reactions  . Hydralazine Itching  . Robaxin [Methocarbamol]     Dizziness     Social History: The patient  reports that  has never smoked. she has never used smokeless tobacco. She reports that she does not drink alcohol or use drugs.   Family History: The patient's family history includes Arthritis in her other; Coronary artery disease in  her other; Diabetes in her other; Hypertension in her other.   Review of Systems: Please see the history of present illness.   Otherwise, the review of systems is positive for none.   All other systems are reviewed and negative.   Physical Exam: VS:  There were no vitals taken for this visit. [place] Kitchen  BMI There is no height or weight on file to calculate BMI.  Wt Readings from Last 3 Encounters:  11/15/17 128 lb 12 oz (58.4 kg)  11/11/17 129 lb 4.8 oz (58.7 kg)  10/20/17 150 lb (68 kg)    General: Pleasant. Well developed, well nourished and in no acute distress.     HEENT: Normal.  Neck: Supple, no JVD, carotid bruits, or masses noted.  Cardiac: Regular rate and rhythm. No murmurs, rubs, or gallops. No edema.  Respiratory:  Lungs are clear to auscultation bilaterally with normal work of breathing.  GI: Soft and nontender.  MS: No deformity or atrophy. Gait and ROM intact.  Skin: Warm and dry. Color is normal.  Neuro:  Strength and sensation are intact and no gross focal deficits noted.  Psych: Alert, appropriate and with normal affect.   LABORATORY DATA:  EKG:  EKG is not ordered today.  Lab Results  Component Value Date   WBC 2.8 (L) 11/16/2017   HGB 8.8 (L) 11/16/2017   HCT 29.0 (L) 11/16/2017   PLT 153 11/16/2017   GLUCOSE 87 11/16/2017   CHOL 188 07/21/2015   TRIG 123.0 07/21/2015   HDL 48.50 07/21/2015   LDLDIRECT 196.2 04/26/2013   LDLCALC 115 (H) 07/21/2015   ALT 34 11/15/2017   AST 57 (H) 11/15/2017   NA 134 (L) 11/16/2017   K 4.1 11/16/2017   CL 92 (L) 11/16/2017   CREATININE 5.26 (H) 11/16/2017   BUN 28 (H) 11/16/2017   CO2 28 11/16/2017   TSH 0.536 11/15/2017   INR 1.05 07/20/2016   HGBA1C 4.8 11/15/2017   MICROALBUR 174.7 (H) 01/03/2008     BNP (last 3 results) Recent Labs    11/07/17 0015  BNP 1,397.8*    ProBNP (last 3 results) No results for input(s): PROBNP in the last 8760 hours.   Other Studies Reviewed Today:  Echo Study Conclusions 10/2017  - Left ventricle: The cavity size was mildly dilated. Wall   thickness was increased in a pattern of moderate LVH. Systolic   function was normal. The estimated ejection fraction was in the   range of 55% to 60%. - Mitral valve: Calcified annulus. Mildly thickened leaflets . - Left atrium: The atrium was moderately dilated. - Atrial septum: No defect or patent foramen ovale was identified.  Assessment/Plan:  1. Recent bout of influenza associated with altered mental status -   2. ESRD on chronic dialysis  3. HTN  4. Aortic stenosis - noted on  prior echo - the most current study notes moderately calcified leaflets and sclerosis without stenosis.   5. Diastolic dysfunction - not noted on most recent echo.    Current medicines are reviewed with the patient today.  The patient does not have concerns regarding medicines other than what has been noted above.  The following changes have been made:  See above.  Labs/ tests ordered today include:   No orders of the defined types were placed in this encounter.    Disposition:   FU with *** in {gen number 5-64:[id number]} {Days to years:10300}.   Patient is agreeable to this plan and will call if any  problems develop in the interim.   SignedTruitt Merle, NP  11/21/2017 7:53 AM  [facility] [address] [place], [place]  [address] Phone: [phone] Fax: [phone]

## 2017-11-22 DIAGNOSIS — N186 End stage renal disease: Secondary | ICD-10-CM | POA: Diagnosis not present

## 2017-11-22 DIAGNOSIS — N2581 Secondary hyperparathyroidism of renal origin: Secondary | ICD-10-CM | POA: Diagnosis not present

## 2017-11-22 LAB — CULTURE, BLOOD (ROUTINE X 2): SPECIAL REQUESTS: ADEQUATE

## 2017-11-24 DIAGNOSIS — N186 End stage renal disease: Secondary | ICD-10-CM | POA: Diagnosis not present

## 2017-11-24 DIAGNOSIS — N2581 Secondary hyperparathyroidism of renal origin: Secondary | ICD-10-CM | POA: Diagnosis not present

## 2017-11-27 ENCOUNTER — Inpatient Hospital Stay: Payer: Medicare HMO | Admitting: Family Medicine

## 2017-11-27 DIAGNOSIS — N186 End stage renal disease: Secondary | ICD-10-CM | POA: Diagnosis not present

## 2017-11-27 DIAGNOSIS — N2581 Secondary hyperparathyroidism of renal origin: Secondary | ICD-10-CM | POA: Diagnosis not present

## 2017-11-29 DIAGNOSIS — N186 End stage renal disease: Secondary | ICD-10-CM | POA: Diagnosis not present

## 2017-11-29 DIAGNOSIS — N2581 Secondary hyperparathyroidism of renal origin: Secondary | ICD-10-CM | POA: Diagnosis not present

## 2017-11-30 DIAGNOSIS — N186 End stage renal disease: Secondary | ICD-10-CM | POA: Diagnosis not present

## 2017-11-30 DIAGNOSIS — E1122 Type 2 diabetes mellitus with diabetic chronic kidney disease: Secondary | ICD-10-CM | POA: Diagnosis not present

## 2017-11-30 DIAGNOSIS — Z992 Dependence on renal dialysis: Secondary | ICD-10-CM | POA: Diagnosis not present

## 2017-12-01 DIAGNOSIS — N186 End stage renal disease: Secondary | ICD-10-CM | POA: Diagnosis not present

## 2017-12-01 DIAGNOSIS — Z992 Dependence on renal dialysis: Secondary | ICD-10-CM | POA: Diagnosis not present

## 2017-12-01 DIAGNOSIS — N2581 Secondary hyperparathyroidism of renal origin: Secondary | ICD-10-CM | POA: Diagnosis not present

## 2017-12-01 DIAGNOSIS — E1122 Type 2 diabetes mellitus with diabetic chronic kidney disease: Secondary | ICD-10-CM | POA: Diagnosis not present

## 2017-12-04 DIAGNOSIS — N2581 Secondary hyperparathyroidism of renal origin: Secondary | ICD-10-CM | POA: Diagnosis not present

## 2017-12-04 DIAGNOSIS — N186 End stage renal disease: Secondary | ICD-10-CM | POA: Diagnosis not present

## 2017-12-06 DIAGNOSIS — N186 End stage renal disease: Secondary | ICD-10-CM | POA: Diagnosis not present

## 2017-12-06 DIAGNOSIS — N2581 Secondary hyperparathyroidism of renal origin: Secondary | ICD-10-CM | POA: Diagnosis not present

## 2017-12-08 DIAGNOSIS — N2581 Secondary hyperparathyroidism of renal origin: Secondary | ICD-10-CM | POA: Diagnosis not present

## 2017-12-08 DIAGNOSIS — N186 End stage renal disease: Secondary | ICD-10-CM | POA: Diagnosis not present

## 2017-12-11 DIAGNOSIS — N2581 Secondary hyperparathyroidism of renal origin: Secondary | ICD-10-CM | POA: Diagnosis not present

## 2017-12-11 DIAGNOSIS — N186 End stage renal disease: Secondary | ICD-10-CM | POA: Diagnosis not present

## 2017-12-13 DIAGNOSIS — N2581 Secondary hyperparathyroidism of renal origin: Secondary | ICD-10-CM | POA: Diagnosis not present

## 2017-12-13 DIAGNOSIS — N186 End stage renal disease: Secondary | ICD-10-CM | POA: Diagnosis not present

## 2017-12-15 DIAGNOSIS — N186 End stage renal disease: Secondary | ICD-10-CM | POA: Diagnosis not present

## 2017-12-15 DIAGNOSIS — N2581 Secondary hyperparathyroidism of renal origin: Secondary | ICD-10-CM | POA: Diagnosis not present

## 2017-12-18 DIAGNOSIS — N186 End stage renal disease: Secondary | ICD-10-CM | POA: Diagnosis not present

## 2017-12-18 DIAGNOSIS — N2581 Secondary hyperparathyroidism of renal origin: Secondary | ICD-10-CM | POA: Diagnosis not present

## 2017-12-20 DIAGNOSIS — N186 End stage renal disease: Secondary | ICD-10-CM | POA: Diagnosis not present

## 2017-12-20 DIAGNOSIS — N2581 Secondary hyperparathyroidism of renal origin: Secondary | ICD-10-CM | POA: Diagnosis not present

## 2017-12-22 DIAGNOSIS — N2581 Secondary hyperparathyroidism of renal origin: Secondary | ICD-10-CM | POA: Diagnosis not present

## 2017-12-22 DIAGNOSIS — N186 End stage renal disease: Secondary | ICD-10-CM | POA: Diagnosis not present

## 2017-12-25 DIAGNOSIS — N2581 Secondary hyperparathyroidism of renal origin: Secondary | ICD-10-CM | POA: Diagnosis not present

## 2017-12-25 DIAGNOSIS — N186 End stage renal disease: Secondary | ICD-10-CM | POA: Diagnosis not present

## 2017-12-27 DIAGNOSIS — N2581 Secondary hyperparathyroidism of renal origin: Secondary | ICD-10-CM | POA: Diagnosis not present

## 2017-12-27 DIAGNOSIS — N186 End stage renal disease: Secondary | ICD-10-CM | POA: Diagnosis not present

## 2017-12-29 DIAGNOSIS — Z992 Dependence on renal dialysis: Secondary | ICD-10-CM | POA: Diagnosis not present

## 2017-12-29 DIAGNOSIS — N186 End stage renal disease: Secondary | ICD-10-CM | POA: Diagnosis not present

## 2017-12-29 DIAGNOSIS — N2581 Secondary hyperparathyroidism of renal origin: Secondary | ICD-10-CM | POA: Diagnosis not present

## 2017-12-29 DIAGNOSIS — E1122 Type 2 diabetes mellitus with diabetic chronic kidney disease: Secondary | ICD-10-CM | POA: Diagnosis not present

## 2018-01-02 DIAGNOSIS — N2581 Secondary hyperparathyroidism of renal origin: Secondary | ICD-10-CM | POA: Diagnosis not present

## 2018-01-02 DIAGNOSIS — N186 End stage renal disease: Secondary | ICD-10-CM | POA: Diagnosis not present

## 2018-01-03 DIAGNOSIS — N186 End stage renal disease: Secondary | ICD-10-CM | POA: Diagnosis not present

## 2018-01-03 DIAGNOSIS — N2581 Secondary hyperparathyroidism of renal origin: Secondary | ICD-10-CM | POA: Diagnosis not present

## 2018-01-05 DIAGNOSIS — N2581 Secondary hyperparathyroidism of renal origin: Secondary | ICD-10-CM | POA: Diagnosis not present

## 2018-01-05 DIAGNOSIS — N186 End stage renal disease: Secondary | ICD-10-CM | POA: Diagnosis not present

## 2018-01-08 DIAGNOSIS — N2581 Secondary hyperparathyroidism of renal origin: Secondary | ICD-10-CM | POA: Diagnosis not present

## 2018-01-08 DIAGNOSIS — N186 End stage renal disease: Secondary | ICD-10-CM | POA: Diagnosis not present

## 2018-01-10 DIAGNOSIS — N2581 Secondary hyperparathyroidism of renal origin: Secondary | ICD-10-CM | POA: Diagnosis not present

## 2018-01-10 DIAGNOSIS — N186 End stage renal disease: Secondary | ICD-10-CM | POA: Diagnosis not present

## 2018-01-12 DIAGNOSIS — N2581 Secondary hyperparathyroidism of renal origin: Secondary | ICD-10-CM | POA: Diagnosis not present

## 2018-01-12 DIAGNOSIS — N186 End stage renal disease: Secondary | ICD-10-CM | POA: Diagnosis not present

## 2018-01-15 DIAGNOSIS — N186 End stage renal disease: Secondary | ICD-10-CM | POA: Diagnosis not present

## 2018-01-15 DIAGNOSIS — N2581 Secondary hyperparathyroidism of renal origin: Secondary | ICD-10-CM | POA: Diagnosis not present

## 2018-01-17 DIAGNOSIS — N2581 Secondary hyperparathyroidism of renal origin: Secondary | ICD-10-CM | POA: Diagnosis not present

## 2018-01-17 DIAGNOSIS — N186 End stage renal disease: Secondary | ICD-10-CM | POA: Diagnosis not present

## 2018-01-19 DIAGNOSIS — N186 End stage renal disease: Secondary | ICD-10-CM | POA: Diagnosis not present

## 2018-01-19 DIAGNOSIS — N2581 Secondary hyperparathyroidism of renal origin: Secondary | ICD-10-CM | POA: Diagnosis not present

## 2018-01-22 DIAGNOSIS — N2581 Secondary hyperparathyroidism of renal origin: Secondary | ICD-10-CM | POA: Diagnosis not present

## 2018-01-22 DIAGNOSIS — N186 End stage renal disease: Secondary | ICD-10-CM | POA: Diagnosis not present

## 2018-01-24 DIAGNOSIS — N186 End stage renal disease: Secondary | ICD-10-CM | POA: Diagnosis not present

## 2018-01-24 DIAGNOSIS — N2581 Secondary hyperparathyroidism of renal origin: Secondary | ICD-10-CM | POA: Diagnosis not present

## 2018-01-26 DIAGNOSIS — N2581 Secondary hyperparathyroidism of renal origin: Secondary | ICD-10-CM | POA: Diagnosis not present

## 2018-01-26 DIAGNOSIS — N186 End stage renal disease: Secondary | ICD-10-CM | POA: Diagnosis not present

## 2018-01-27 ENCOUNTER — Observation Stay (HOSPITAL_COMMUNITY)
Admission: EM | Admit: 2018-01-27 | Discharge: 2018-01-29 | Disposition: A | Discharge status: Home / Self Care | Payer: Medicare HMO | Attending: Internal Medicine | Admitting: Internal Medicine

## 2018-01-27 ENCOUNTER — Ambulatory Visit (HOSPITAL_COMMUNITY)
Admission: EM | Admit: 2018-01-27 | Discharge: 2018-01-27 | Discharge status: Against Medical Advice | Payer: Medicare HMO | Source: Home / Self Care

## 2018-01-27 ENCOUNTER — Encounter (HOSPITAL_COMMUNITY): Payer: Self-pay

## 2018-01-27 ENCOUNTER — Other Ambulatory Visit: Payer: Self-pay

## 2018-01-27 DIAGNOSIS — Z8249 Family history of ischemic heart disease and other diseases of the circulatory system: Secondary | ICD-10-CM | POA: Insufficient documentation

## 2018-01-27 DIAGNOSIS — N186 End stage renal disease: Secondary | ICD-10-CM | POA: Diagnosis present

## 2018-01-27 DIAGNOSIS — E559 Vitamin D deficiency, unspecified: Secondary | ICD-10-CM | POA: Diagnosis not present

## 2018-01-27 DIAGNOSIS — R7989 Other specified abnormal findings of blood chemistry: Secondary | ICD-10-CM

## 2018-01-27 DIAGNOSIS — Z833 Family history of diabetes mellitus: Secondary | ICD-10-CM | POA: Diagnosis not present

## 2018-01-27 DIAGNOSIS — I5032 Chronic diastolic (congestive) heart failure: Secondary | ICD-10-CM | POA: Diagnosis not present

## 2018-01-27 DIAGNOSIS — R42 Dizziness and giddiness: Secondary | ICD-10-CM | POA: Diagnosis not present

## 2018-01-27 DIAGNOSIS — R9431 Abnormal electrocardiogram [ECG] [EKG]: Secondary | ICD-10-CM | POA: Diagnosis not present

## 2018-01-27 DIAGNOSIS — B181 Chronic viral hepatitis B without delta-agent: Secondary | ICD-10-CM | POA: Insufficient documentation

## 2018-01-27 DIAGNOSIS — D509 Iron deficiency anemia, unspecified: Secondary | ICD-10-CM | POA: Insufficient documentation

## 2018-01-27 DIAGNOSIS — I951 Orthostatic hypotension: Secondary | ICD-10-CM | POA: Diagnosis not present

## 2018-01-27 DIAGNOSIS — I132 Hypertensive heart and chronic kidney disease with heart failure and with stage 5 chronic kidney disease, or end stage renal disease: Secondary | ICD-10-CM | POA: Diagnosis not present

## 2018-01-27 DIAGNOSIS — D631 Anemia in chronic kidney disease: Secondary | ICD-10-CM | POA: Insufficient documentation

## 2018-01-27 DIAGNOSIS — E785 Hyperlipidemia, unspecified: Secondary | ICD-10-CM | POA: Diagnosis not present

## 2018-01-27 DIAGNOSIS — I252 Old myocardial infarction: Secondary | ICD-10-CM | POA: Diagnosis not present

## 2018-01-27 DIAGNOSIS — E1122 Type 2 diabetes mellitus with diabetic chronic kidney disease: Secondary | ICD-10-CM | POA: Diagnosis not present

## 2018-01-27 DIAGNOSIS — D539 Nutritional anemia, unspecified: Secondary | ICD-10-CM | POA: Insufficient documentation

## 2018-01-27 DIAGNOSIS — R778 Other specified abnormalities of plasma proteins: Secondary | ICD-10-CM | POA: Diagnosis present

## 2018-01-27 DIAGNOSIS — E876 Hypokalemia: Secondary | ICD-10-CM | POA: Diagnosis not present

## 2018-01-27 DIAGNOSIS — R55 Syncope and collapse: Secondary | ICD-10-CM

## 2018-01-27 DIAGNOSIS — E1121 Type 2 diabetes mellitus with diabetic nephropathy: Secondary | ICD-10-CM | POA: Insufficient documentation

## 2018-01-27 DIAGNOSIS — F329 Major depressive disorder, single episode, unspecified: Secondary | ICD-10-CM | POA: Diagnosis not present

## 2018-01-27 DIAGNOSIS — Z992 Dependence on renal dialysis: Secondary | ICD-10-CM | POA: Insufficient documentation

## 2018-01-27 DIAGNOSIS — D649 Anemia, unspecified: Secondary | ICD-10-CM | POA: Diagnosis not present

## 2018-01-27 DIAGNOSIS — M8580 Other specified disorders of bone density and structure, unspecified site: Secondary | ICD-10-CM | POA: Diagnosis not present

## 2018-01-27 DIAGNOSIS — I700 Atherosclerosis of aorta: Secondary | ICD-10-CM | POA: Insufficient documentation

## 2018-01-27 DIAGNOSIS — R748 Abnormal levels of other serum enzymes: Secondary | ICD-10-CM | POA: Diagnosis present

## 2018-01-27 DIAGNOSIS — Z888 Allergy status to other drugs, medicaments and biological substances status: Secondary | ICD-10-CM | POA: Diagnosis not present

## 2018-01-27 DIAGNOSIS — Z9115 Patient's noncompliance with renal dialysis: Secondary | ICD-10-CM

## 2018-01-27 DIAGNOSIS — I10 Essential (primary) hypertension: Secondary | ICD-10-CM | POA: Diagnosis present

## 2018-01-27 LAB — CBC
HEMATOCRIT: 23.5 % — AB (ref 36.0–46.0)
Hemoglobin: 7.3 g/dL — ABNORMAL LOW (ref 12.0–15.0)
MCH: 31.3 pg (ref 26.0–34.0)
MCHC: 31.1 g/dL (ref 30.0–36.0)
MCV: 100.9 fL — ABNORMAL HIGH (ref 78.0–100.0)
Platelets: 92 10*3/uL — ABNORMAL LOW (ref 150–400)
RBC: 2.33 MIL/uL — AB (ref 3.87–5.11)
RDW: 16.1 % — ABNORMAL HIGH (ref 11.5–15.5)
WBC: 3.4 10*3/uL — AB (ref 4.0–10.5)

## 2018-01-27 LAB — BASIC METABOLIC PANEL
ANION GAP: 14 (ref 5–15)
BUN: 31 mg/dL — ABNORMAL HIGH (ref 6–20)
CALCIUM: 8.2 mg/dL — AB (ref 8.9–10.3)
CO2: 28 mmol/L (ref 22–32)
Chloride: 96 mmol/L — ABNORMAL LOW (ref 101–111)
Creatinine, Ser: 5.5 mg/dL — ABNORMAL HIGH (ref 0.44–1.00)
GFR, EST AFRICAN AMERICAN: 8 mL/min — AB (ref >60)
GFR, EST NON AFRICAN AMERICAN: 7 mL/min — AB (ref >60)
GLUCOSE: 115 mg/dL — AB (ref 65–99)
POTASSIUM: 3.1 mmol/L — AB (ref 3.5–5.1)
SODIUM: 138 mmol/L (ref 135–145)

## 2018-01-27 LAB — TROPONIN I: TROPONIN I: 0.05 ng/mL — AB (ref <0.03)

## 2018-01-27 LAB — POC OCCULT BLOOD, ED: FECAL OCCULT BLD: NEGATIVE

## 2018-01-27 NOTE — ED Provider Notes (Signed)
[facility] EMERGENCY DEPARTMENT Provider Note   CSN: [id number] Arrival date & time: 01/27/18  1421     History   Chief Complaint No chief complaint on file.   HPI [name] [name] is a 77 y.o. female.  Patient presents with concern for syncopal episode Friday (yesterday) while at dialysis. Her treatment was stopped until she recovered and she was then able to complete the full session. No pain. Per family member, this has happened in the past. The patient is also concerned about excessive bleeding from her fistula after the last 2 treatments. No bleeding now. She has felt lightheaded at home since her dialysis yesterday but no further syncope. No SOB, fever, CP.  The history is provided by the patient and a relative. No language interpreter was used.    Past Medical History:  Diagnosis Date  . DEPRESSION   . DIABETES MELLITUS, TYPE I   . GERD   . GLAUCOMA   . Headache(784.0)   . Hepatitis B carrier ([place])    05/2009: neg Hep C; Hep B: core pos, Surf neg; fatty liver US 8/10 - 7/13  . HYPERLIPIDEMIA   . HYPERTENSION   . Kidney failure    dialysis 3 times per week  . OSTEOPENIA   . POSTMENOPAUSAL STATUS   . Pulmonary edema   . Unspecified vitamin D deficiency     Patient Active Problem List   Diagnosis Date Noted  . Acute metabolic encephalopathy 58/52/7782  . Influenza 11/16/2017  . Fever in adult 11/15/2017  . Demand ischemia ([place]) 11/10/2017  . Normocytic anemia 11/10/2017  . Aortic atherosclerosis ([place]) 11/10/2017  . Respiratory failure with hypoxia ([place]) 08/18/2017  . Volume overload 08/18/2017  . Dialysis patient, noncompliant ([place]) 08/17/2017  . Thumb pain, left 12/22/2016  . Chronic diastolic heart failure ([place]) 11/25/2016  . End stage renal disease ([place]) 06/25/2014  . Malignant hypertension 05/17/2014  . Chest pain 05/17/2014  . Elevated troponin 05/17/2014  . Hypertensive emergency 05/17/2014  . NSTEMI (non-ST elevated myocardial  infarction) ([place]) 05/17/2014  . Chronic kidney disease, stage IV (severe) ([place]) 12/05/2013  . GERD 06/17/2009  . Hepatitis B carrier ([place]) 06/17/2009  . DEPRESSION 05/20/2009  . Unspecified glaucoma 05/20/2009  . HEADACHE 05/20/2009  . CARDIAC MURMUR 05/20/2009  . OSTEOPENIA 02/15/2008  . POSTMENOPAUSAL STATUS 12/21/2007  . Hyperlipidemia LDL goal <70 09/21/2007  . Essential hypertension 09/13/2007  . Type II diabetes mellitus with nephropathy [facility])     Past Surgical History:  Procedure Laterality Date  . AV FISTULA PLACEMENT Left 05/29/2014   Procedure: INSERTION OF ARTERIOVENOUS (AV) GORE-TEX GRAFT ARM-LEFT;  Surgeon: Angelia Mould, MD;  Location: [facility];  Service: Vascular;  Laterality: Left;  . CHOLECYSTECTOMY  02/12/07  . INSERTION OF DIALYSIS CATHETER Right 05/27/2014   Procedure: INSERTION OF DIALYSIS CATHETER;  Surgeon: Angelia Mould, MD;  Location: [facility];  Service: Vascular;  Laterality: Right;  . REFRACTIVE SURGERY  07/29/09   Dr. Bing Plume  . TONSILLECTOMY       OB History   None      Home Medications    Prior to Admission medications   Medication Sig Start Date End Date Taking? Authorizing Provider  amLODipine (NORVASC) 10 MG tablet TAKE 1 TABLET BY MOUTH EVERY DAY 12/30/15   Carollee Herter, Alferd Apa, DO  calcitRIOL (ROCALTROL) 0.5 MCG capsule Take 3 capsules (1.5 mcg total) by mouth 3 (three) times a week. 11/13/17   Jani Gravel, MD  carvedilol ([place])  25 MG tablet TAKE 1 TABLET (25 MG TOTAL) BY MOUTH 2 (TWO) TIMES DAILY WITH A MEAL. 09/19/17   Roma Schanz R, DO  ferrous sulfate 325 (65 FE) MG tablet Take 325-650 mg by mouth daily with breakfast.    [provider]  furosemide (LASIX) 40 MG tablet TAKE 1 TABLET BY MOUTH EVERY DAY 11/20/17   Mosie Lukes, MD  isosorbide mononitrate (IMDUR) 30 MG 24 hr tablet TAKE 1 TABLET BY MOUTH TWICE A DAY 11/20/17   Mosie Lukes, MD  lisinopril (PRINIVIL,ZESTRIL) 40 MG tablet Take 1 tablet (40 mg total)  by mouth at bedtime. 09/27/17   Ann Held, DO  Multiple Vitamin (MULTIVITAMIN WITH MINERALS) TABS tablet Take 1 tablet by mouth daily.     [provider]    Family History Family History  Problem Relation Age of Onset  . Coronary artery disease Other   . Diabetes Other   . Hypertension Other   . Arthritis Other     Social History Social History   Tobacco Use  . Smoking status: Never Smoker  . Smokeless tobacco: Never Used  . Tobacco comment: Married x's 55yrs, 6 kids-scattered [employer]. Retired Consulting civil engineer  Substance Use Topics  . Alcohol use: No  . Drug use: No     Allergies   Hydralazine and Robaxin [methocarbamol]   Review of Systems Review of Systems  Constitutional: Negative for chills and fever.  Respiratory: Negative.  Negative for shortness of breath.   Cardiovascular: Negative.  Negative for chest pain.  Gastrointestinal: Negative.  Negative for vomiting.  Musculoskeletal: Negative.  Negative for myalgias.  Skin: Negative.  Negative for pallor.  Neurological: Positive for syncope, weakness and light-headedness.     Physical Exam Updated Vital Signs BP (!) 131/47 (BP Location: Right Arm)   Pulse 75   Temp 98.1 F (36.7 C) (Oral)   Resp 16   Ht 5\' 5"  (1.651 m)   Wt 58.1 kg (128 lb)   SpO2 100%   BMI 21.30 kg/m   Physical Exam  Constitutional: She is oriented to person, place, and time. She appears well-developed and well-nourished.  HENT:  Head: Normocephalic.  Neck: Normal range of motion. Neck supple.  Cardiovascular: Normal rate and regular rhythm.  No murmur heard. Pulmonary/Chest: Effort normal and breath sounds normal. She has no wheezes. She has no rales.  Abdominal: Soft. Bowel sounds are normal. There is no tenderness. There is no rebound and no guarding.  Musculoskeletal: Normal range of motion. She exhibits no edema.  Neurological: She is alert and oriented to person, place, and time. Coordination normal.    Skin: Skin is warm and dry. No rash noted.  Psychiatric: She has a normal mood and affect.     ED Treatments / Results  Labs (all labs ordered are listed, but only abnormal results are displayed) Labs Reviewed  BASIC METABOLIC PANEL - Abnormal; Notable for the following components:      Result Value   Potassium 3.1 (*)    Chloride 96 (*)    Glucose, Bld 115 (*)    BUN 31 (*)    Creatinine, Ser 5.50 (*)    Calcium 8.2 (*)    GFR calc non Af Amer 7 (*)    GFR calc Af Amer 8 (*)    All other components within normal limits  CBC - Abnormal; Notable for the following components:   WBC 3.4 (*)    RBC 2.33 (*)  Hemoglobin 7.3 (*)    HCT 23.5 (*)    MCV 100.9 (*)    RDW 16.1 (*)    Platelets 92 (*)    All other components within normal limits  TROPONIN I  POC OCCULT BLOOD, ED  TYPE AND SCREEN    EKG EKG Interpretation  Date/Time:  Saturday January 27 2018 14:29:20 EDT Ventricular Rate:  65 PR Interval:  186 QRS Duration: 86 QT Interval:  424 QTC Calculation: 440 R Axis:   38 Text Interpretation:  Normal sinus rhythm ST & T wave abnormality, consider inferolateral ischemia Abnormal ECG T wave inversions Confirmed by Elnora Morrison [id number]) on 01/27/2018 10:43:58 PM   Radiology No results found.  Procedures Procedures (including critical care time)  Medications Ordered in ED Medications - No data to display   Initial Impression / Assessment and Plan / ED Course  I have reviewed the triage vital signs and the nursing notes.  Pertinent labs & imaging results that were available during my care of the patient were reviewed by me and considered in my medical decision making (see chart for details).     Patient presents with family with concern for syncope yesterday at dialysis with persistent lightheadedness. She had a concern for excessive graft bleeding after her treatments this week but no bleeding now. No CP, fever, SOB.  EKG show inverted T-waves in  inferolateral leads which is a change from previous. No chest pain. Troponin is 0.05. Recent admission for elevated troponin thought to be demand ischemia was 0.46.   Patient reports that her dialysis 'rate' was increased to 11 yesterday which has caused syncope in the past. Family feels this is the cause of syncope. No known change in her dry weight.   No apparent fluid overload today. Suspect some dehydration with mild orthostasis and lightheadedness. Small bolus of 250 cc provided.   She is anemic to 7.3 today. Last hgb 8.8 2 months ago and 9.9, also 2 months ago. Guaiac negative. Previous documentation relates baseline is 7.1-7.4. No transfusion felt emergently needed. Will observe.  Discussed admission with dr. Myna Hidalgo who accepts the patient on to his service.  Final Clinical Impressions(s) / ED Diagnoses   Final diagnoses:  None   1. EKG changes 2. Syncope 3. Anemia   ED Discharge Orders    None       Charlann Lange, PA-C 01/28/18 0041    Elnora Morrison, MD 01/29/18 0110

## 2018-01-27 NOTE — ED Triage Notes (Signed)
Patient here to have left arm fistula checked. States that she has had more than normal bleeding from site than normal. Last dialysis yesterday and had full treatment, reports while receiving tx had syncopal event. Today no pain, alert and oriented

## 2018-01-27 NOTE — ED Notes (Signed)
Pt reports having syncopal episode at dialysis yesterday - states normally happens to her "when they have it turned up too high".  Denies head injuries. Presents today because she is still having dizziness, blurred vision and HA.  BP 108/45.  Discussed with Baird Lyons, PA & Stark Klein, PA.  Suggest pt go to ED for labs and possible fluids.  Discussed with family.  All verbalized understanding.

## 2018-01-28 ENCOUNTER — Other Ambulatory Visit: Payer: Self-pay

## 2018-01-28 ENCOUNTER — Observation Stay (HOSPITAL_COMMUNITY): Payer: Medicare HMO

## 2018-01-28 ENCOUNTER — Encounter (HOSPITAL_COMMUNITY): Payer: Self-pay | Admitting: Family Medicine

## 2018-01-28 DIAGNOSIS — Z9115 Patient's noncompliance with renal dialysis: Secondary | ICD-10-CM

## 2018-01-28 DIAGNOSIS — I10 Essential (primary) hypertension: Secondary | ICD-10-CM

## 2018-01-28 DIAGNOSIS — I132 Hypertensive heart and chronic kidney disease with heart failure and with stage 5 chronic kidney disease, or end stage renal disease: Secondary | ICD-10-CM | POA: Diagnosis not present

## 2018-01-28 DIAGNOSIS — E1121 Type 2 diabetes mellitus with diabetic nephropathy: Secondary | ICD-10-CM | POA: Diagnosis not present

## 2018-01-28 DIAGNOSIS — D539 Nutritional anemia, unspecified: Secondary | ICD-10-CM

## 2018-01-28 DIAGNOSIS — I951 Orthostatic hypotension: Secondary | ICD-10-CM | POA: Diagnosis not present

## 2018-01-28 DIAGNOSIS — R748 Abnormal levels of other serum enzymes: Secondary | ICD-10-CM

## 2018-01-28 DIAGNOSIS — N186 End stage renal disease: Secondary | ICD-10-CM | POA: Diagnosis not present

## 2018-01-28 DIAGNOSIS — R9431 Abnormal electrocardiogram [ECG] [EKG]: Secondary | ICD-10-CM | POA: Diagnosis not present

## 2018-01-28 DIAGNOSIS — I700 Atherosclerosis of aorta: Secondary | ICD-10-CM | POA: Diagnosis not present

## 2018-01-28 DIAGNOSIS — R55 Syncope and collapse: Secondary | ICD-10-CM | POA: Diagnosis not present

## 2018-01-28 DIAGNOSIS — D631 Anemia in chronic kidney disease: Secondary | ICD-10-CM | POA: Diagnosis not present

## 2018-01-28 DIAGNOSIS — Z992 Dependence on renal dialysis: Secondary | ICD-10-CM | POA: Diagnosis not present

## 2018-01-28 DIAGNOSIS — I5032 Chronic diastolic (congestive) heart failure: Secondary | ICD-10-CM | POA: Diagnosis not present

## 2018-01-28 DIAGNOSIS — E1122 Type 2 diabetes mellitus with diabetic chronic kidney disease: Secondary | ICD-10-CM | POA: Diagnosis not present

## 2018-01-28 LAB — BASIC METABOLIC PANEL
ANION GAP: 16 — AB (ref 5–15)
BUN: 42 mg/dL — ABNORMAL HIGH (ref 6–20)
CALCIUM: 8.5 mg/dL — AB (ref 8.9–10.3)
CO2: 27 mmol/L (ref 22–32)
CREATININE: 6.29 mg/dL — AB (ref 0.44–1.00)
Chloride: 96 mmol/L — ABNORMAL LOW (ref 101–111)
GFR, EST AFRICAN AMERICAN: 7 mL/min — AB (ref >60)
GFR, EST NON AFRICAN AMERICAN: 6 mL/min — AB (ref >60)
Glucose, Bld: 98 mg/dL (ref 65–99)
Potassium: 3.1 mmol/L — ABNORMAL LOW (ref 3.5–5.1)
SODIUM: 139 mmol/L (ref 135–145)

## 2018-01-28 LAB — IRON AND TIBC
Iron: 63 ug/dL (ref 28–170)
Saturation Ratios: 24 % (ref 10.4–31.8)
TIBC: 260 ug/dL (ref 250–450)
UIBC: 197 ug/dL

## 2018-01-28 LAB — PREPARE RBC (CROSSMATCH)

## 2018-01-28 LAB — HEMOGLOBIN AND HEMATOCRIT, BLOOD
HEMATOCRIT: 25.2 % — AB (ref 36.0–46.0)
HEMOGLOBIN: 8 g/dL — AB (ref 12.0–15.0)

## 2018-01-28 LAB — RETICULOCYTES
RBC.: 2.36 MIL/uL — ABNORMAL LOW (ref 3.87–5.11)
RETIC COUNT ABSOLUTE: 44.8 10*3/uL (ref 19.0–186.0)
RETIC CT PCT: 1.9 % (ref 0.4–3.1)

## 2018-01-28 LAB — TROPONIN I
TROPONIN I: 0.04 ng/mL — AB (ref <0.03)
Troponin I: 0.05 ng/mL (ref <0.03)
Troponin I: 0.04 ng/mL (ref <0.03)

## 2018-01-28 LAB — CBC
HCT: 23.4 % — ABNORMAL LOW (ref 36.0–46.0)
Hemoglobin: 7.4 g/dL — ABNORMAL LOW (ref 12.0–15.0)
MCH: 31.8 pg (ref 26.0–34.0)
MCHC: 31.6 g/dL (ref 30.0–36.0)
MCV: 100.4 fL — AB (ref 78.0–100.0)
PLATELETS: 84 10*3/uL — AB (ref 150–400)
RBC: 2.33 MIL/uL — ABNORMAL LOW (ref 3.87–5.11)
RDW: 16.1 % — AB (ref 11.5–15.5)
WBC: 3.6 10*3/uL — ABNORMAL LOW (ref 4.0–10.5)

## 2018-01-28 LAB — FERRITIN: FERRITIN: 731 ng/mL — AB (ref 11–307)

## 2018-01-28 LAB — MAGNESIUM: MAGNESIUM: 1.9 mg/dL (ref 1.7–2.4)

## 2018-01-28 LAB — FOLATE: Folate: >100 ng/mL (ref >5.9)

## 2018-01-28 LAB — MRSA PCR SCREENING: MRSA by PCR: NEGATIVE

## 2018-01-28 LAB — VITAMIN B12: Vitamin B-12: 497 pg/mL (ref 180–914)

## 2018-01-28 MED ORDER — POTASSIUM CHLORIDE CRYS ER 20 MEQ PO TBCR
20.0000 meq | EXTENDED_RELEASE_TABLET | Freq: Once | ORAL | Status: AC
  Administered 2018-01-28: 20 meq via ORAL
  Filled 2018-01-28: qty 1

## 2018-01-28 MED ORDER — NITROGLYCERIN 0.4 MG SL SUBL: 0.4000 mg | SUBLINGUAL_TABLET | SUBLINGUAL | Status: DC | PRN

## 2018-01-28 MED ORDER — ASPIRIN 81 MG PO CHEW
324.0000 mg | CHEWABLE_TABLET | ORAL | Status: AC
  Administered 2018-01-28: 324 mg via ORAL
  Filled 2018-01-28: qty 4

## 2018-01-28 MED ORDER — ASPIRIN 300 MG RE SUPP: 300.0000 mg | RECTAL | Status: AC

## 2018-01-28 MED ORDER — SODIUM CHLORIDE 0.9 % IV BOLUS
250.0000 mL | Freq: Once | INTRAVENOUS | Status: AC
  Administered 2018-01-28: 250 mL via INTRAVENOUS

## 2018-01-28 MED ORDER — ONDANSETRON HCL 4 MG/2ML IJ SOLN: 4.0000 mg | Freq: Four times a day (QID) | INTRAMUSCULAR | Status: DC | PRN

## 2018-01-28 MED ORDER — ISOSORBIDE MONONITRATE ER 30 MG PO TB24
30.0000 mg | ORAL_TABLET | Freq: Two times a day (BID) | ORAL | Status: DC
  Administered 2018-01-28 (×2): 30 mg via ORAL
  Filled 2018-01-28 (×2): qty 1

## 2018-01-28 MED ORDER — ZOLPIDEM TARTRATE 5 MG PO TABS: 5.0000 mg | ORAL_TABLET | Freq: Every evening | ORAL | Status: DC | PRN

## 2018-01-28 MED ORDER — ACETAMINOPHEN 325 MG PO TABS: 650.0000 mg | ORAL_TABLET | ORAL | Status: DC | PRN

## 2018-01-28 MED ORDER — HEPARIN SODIUM (PORCINE) 5000 UNIT/ML IJ SOLN
5000.0000 [IU] | Freq: Three times a day (TID) | INTRAMUSCULAR | Status: DC
  Administered 2018-01-28: 5000 [IU] via SUBCUTANEOUS
  Filled 2018-01-28 (×2): qty 1

## 2018-01-28 MED ORDER — ADULT MULTIVITAMIN W/MINERALS CH
1.0000 | ORAL_TABLET | Freq: Every day | ORAL | Status: DC
  Administered 2018-01-28: 1 via ORAL
  Filled 2018-01-28: qty 1

## 2018-01-28 MED ORDER — SODIUM CHLORIDE 0.9 % IV SOLN: Freq: Once | INTRAVENOUS | Status: DC

## 2018-01-28 NOTE — Consult Note (Signed)
Brief [facility] C/s note:  Pt is a lovely 67F with ESRD MWF at [facility] who is now seen for eval of syncope at dialysis.  She had a transient LOC that was not accompanied by CP, HA, SOB.  She felt lightheaded and dizzy.  Ran for 3 hr 20 min of 3 hr 30 min run.  It appears that pt has been getting lightheaded/ dizzy/ low BP during dialysis for the past several treatments and she has been orthostatic during her post-BP evaluations.    Her EDW is 59 kg and she has been leaving between 60.3-60.8 kg.    Her Hgb is 7.4 today.     She previously had a cardiac workup in 10/2017 including TTE and stress test which were negative for ischemia.    I recommend transfusing 1 u pRBCs and we will send instructions to her HD unit to raise her EDW to 61 kg.    Physical Exam: NAD EOMI, PERRL RRR soft systolic murmur Clear bilaterally Benign abd No LE edema AVG + T/B  Current orders:   [place] MWF 3 hr 30 min  F180 AVG EDW 59 kg 2K/ 2Ca bath 1750 bolus heparin Mircera 200 mcg q 2 weeks Calcitriol 0.5 mcg q rx  If she tolerates her blood transfusion well and she no longer has dizziness on standing/ sig orthostasis when she stands up post-transfusion I think would be OK to d/c pt with modifications to her HD regimen.  Madelon Lips MD [facility] pgr [phone]

## 2018-01-28 NOTE — H&P (Signed)
History and Physical    [name] [name] AQT:[id number] DOB: [birth year] DOA: 01/27/2018  PCP: Ann Held, DO   Patient coming from: Home  Chief Complaint: Syncope during HD, persistent lightheadedness on standing   HPI: [name] [name] is a 77 y.o. female with medical history significant for hypertension, anemia, and end-stage renal disease on hemodialysis, now presenting to the emergency department after syncopal episode at dialysis yesterday and with persistent lightheadedness on standing.  Patient reports that during dialysis, she became acutely lightheaded and had a transient loss of consciousness.  She recovered quickly and reports experiencing similar events with dialysis in the past.  She denies any chest pain, shortness of breath, fevers, chills, nausea, or diaphoresis.  She has not fallen due to this.  She reports some bleeding from her fistula following dialysis that lasted longer than usual, but has now resolved.  She denies melena or hematochezia.  Afebrile, saturating well on room air, blood pressure 130/50, and vitals otherwise normal.  ED Course: Upon arrival to the ED, patient is found to be afebrile, saturating well on room air, blood pressure 130/50, and vitals otherwise normal.  There was a 32 mmHg drop in systolic blood pressure from lying to standing.  EKG features a sinus rhythm with inferolateral T wave abnormality.  Chemistry panel is notable for potassium of 3.1 and CBC features a chronic stable leukopenia with WBC 3400, chronic thrombocytopenia with platelets 92,000, and hemoglobin of 7.3, similar to priors.  Troponin is elevated to 0.05.  Type and screen was performed, fecal occult blood testing was negative, and the patient was given 250 cc of normal saline in the ED.  She remains hemodynamically stable, in no apparent respiratory distress, and will be observed on the telemetry unit for ongoing evaluation and management of syncope, likely orthostatic, and slight  elevation in troponin with EKG changes but no anginal complaint.  Review of Systems:  All other systems reviewed and apart from HPI, are negative.  Past Medical History:  Diagnosis Date  . DEPRESSION   . DIABETES MELLITUS, TYPE I   . GERD   . GLAUCOMA   . Headache(784.0)   . Hepatitis B carrier ([place])    05/2009: neg Hep C; Hep B: core pos, Surf neg; fatty liver US 8/10 - 7/13  . HYPERLIPIDEMIA   . HYPERTENSION   . Kidney failure    dialysis 3 times per week  . OSTEOPENIA   . POSTMENOPAUSAL STATUS   . Pulmonary edema   . Unspecified vitamin D deficiency     Past Surgical History:  Procedure Laterality Date  . AV FISTULA PLACEMENT Left 05/29/2014   Procedure: INSERTION OF ARTERIOVENOUS (AV) GORE-TEX GRAFT ARM-LEFT;  Surgeon: Angelia Mould, MD;  Location: [place];  Service: Vascular;  Laterality: Left;  . CHOLECYSTECTOMY  02/12/07  . INSERTION OF DIALYSIS CATHETER Right 05/27/2014   Procedure: INSERTION OF DIALYSIS CATHETER;  Surgeon: Angelia Mould, MD;  Location: [place];  Service: Vascular;  Laterality: Right;  . REFRACTIVE SURGERY  07/29/09   Dr. Bing Plume  . TONSILLECTOMY       reports that she has never smoked. She has never used smokeless tobacco. She reports that she does not drink alcohol or use drugs.  Allergies  Allergen Reactions  . Hydralazine Itching  . Robaxin [Methocarbamol]     Dizziness     Family History  Problem Relation Age of Onset  . Coronary artery disease Other   . Diabetes Other   .  Hypertension Other   . Arthritis Other      Prior to Admission medications   Medication Sig Start Date End Date Taking? Authorizing Provider  amLODipine (NORVASC) 10 MG tablet TAKE 1 TABLET BY MOUTH EVERY DAY 12/30/15   Carollee Herter, Alferd Apa, DO  calcitRIOL (ROCALTROL) 0.5 MCG capsule Take 3 capsules (1.5 mcg total) by mouth 3 (three) times a week. 11/13/17   Jani Gravel, MD  carvedilol (COREG) 25 MG tablet TAKE 1 TABLET (25 MG TOTAL) BY MOUTH 2 (TWO) TIMES  DAILY WITH A MEAL. 09/19/17   Roma Schanz R, DO  ferrous sulfate 325 (65 FE) MG tablet Take 325-650 mg by mouth daily with breakfast.    [provider]  furosemide (LASIX) 40 MG tablet TAKE 1 TABLET BY MOUTH EVERY DAY 11/20/17   Mosie Lukes, MD  isosorbide mononitrate (IMDUR) 30 MG 24 hr tablet TAKE 1 TABLET BY MOUTH TWICE A DAY 11/20/17   Mosie Lukes, MD  lisinopril (PRINIVIL,ZESTRIL) 40 MG tablet Take 1 tablet (40 mg total) by mouth at bedtime. 09/27/17   Ann Held, DO  Multiple Vitamin (MULTIVITAMIN WITH MINERALS) TABS tablet Take 1 tablet by mouth daily.     [provider]    Physical Exam: Vitals:   01/27/18 1431 01/27/18 1658 01/27/18 2042  BP: (!) 119/49 (!) 159/105 (!) 131/47  Pulse: 68 62 75  Resp: 14 16 16   Temp: 98 F (36.7 C)  98.1 F (36.7 C)  TempSrc:   Oral  SpO2: 100% 97% 100%  Weight: 58.1 kg (128 lb)    Height: 5\' 5"  (1.651 m)        Constitutional: NAD, calm  Eyes: PERTLA, lids and conjunctivae normal ENMT: Mucous membranes are moist. Posterior pharynx clear of any exudate or lesions.   Neck: normal, supple, no masses, no thyromegaly Respiratory: clear to auscultation bilaterally, no wheezing, no crackles. Normal respiratory effort.    Cardiovascular: S1 & S2 heard, regular rate and rhythms. No extremity edema. No significant JVD. Abdomen: No distension, no tenderness, soft. Bowel sounds normal.  Musculoskeletal: no clubbing / cyanosis. No joint deformity upper and lower extremities.   Skin: no significant rashes, lesions, ulcers. Poor turgor. Neurologic: CN 2-12 grossly intact. Sensation intact. Strength 5/5 in all 4 limbs.  Psychiatric:  Alert and oriented x 3. Pleasant, cooperative.     Labs on Admission: I have personally reviewed following labs and imaging studies  CBC: Recent Labs  Lab 01/27/18 1451  WBC 3.4*  HGB 7.3*  HCT 23.5*  MCV 100.9*  PLT 92*   Basic Metabolic Panel: Recent Labs  Lab  01/27/18 1451  NA 138  K 3.1*  CL 96*  CO2 28  GLUCOSE 115*  BUN 31*  CREATININE 5.50*  CALCIUM 8.2*   GFR: Estimated Creatinine Clearance: 7.8 mL/min (A) (by C-G formula based on SCr of 5.5 mg/dL (H)). Liver Function Tests: No results for input(s): AST, ALT, ALKPHOS, BILITOT, PROT, ALBUMIN in the last 168 hours. No results for input(s): LIPASE, AMYLASE in the last 168 hours. No results for input(s): AMMONIA in the last 168 hours. Coagulation Profile: No results for input(s): INR, PROTIME in the last 168 hours. Cardiac Enzymes: Recent Labs  Lab 01/27/18 2245  TROPONINI 0.05*   BNP (last 3 results) No results for input(s): PROBNP in the last 8760 hours. HbA1C: No results for input(s): HGBA1C in the last 72 hours. CBG: No results for input(s): GLUCAP in the last 168 hours.  Lipid Profile: No results for input(s): CHOL, HDL, LDLCALC, TRIG, CHOLHDL, LDLDIRECT in the last 72 hours. Thyroid Function Tests: No results for input(s): TSH, T4TOTAL, FREET4, T3FREE, THYROIDAB in the last 72 hours. Anemia Panel: No results for input(s): VITAMINB12, FOLATE, FERRITIN, TIBC, IRON, RETICCTPCT in the last 72 hours. Urine analysis:    Component Value Date/Time   COLORURINE YELLOW 11/15/2017 1310   APPEARANCEUR CLEAR 11/15/2017 1310   LABSPEC 1.015 11/15/2017 1310   PHURINE 7.0 11/15/2017 1310   GLUCOSEU NEGATIVE 11/15/2017 1310   HGBUR NEGATIVE 11/15/2017 1310   BILIRUBINUR NEGATIVE 11/15/2017 1310   KETONESUR NEGATIVE 11/15/2017 1310   PROTEINUR 100 (A) 11/15/2017 1310   UROBILINOGEN 2.0 (H) 04/01/2008 1751   NITRITE NEGATIVE 11/15/2017 1310   LEUKOCYTESUR NEGATIVE 11/15/2017 1310   Sepsis Labs: @LABRCNTIP (procalcitonin:4,lacticidven:4) )No results found for this or any previous visit (from the past 240 hour(s)).   Radiological Exams on Admission: No results found.  EKG: Independently reviewed. Sinus rhythm, inferolateral T-wave abnormality.   Assessment/Plan  1.  Orthostatic syncope  - Reports syncope during HD 3/29, states she has experienced this several times previously with HD, but she has had persistent lightheadedness on standing since HD  - SBP dropped >30 mmHg on standing in ED   - 250 cc NS bolus given in ED  - Had a recent echo with preserved EF, moderate LVH, moderate LAE  - Continue cardiac monitoring, hold antihypertensives initially, repeat orthostatic vitals in am     2. Elevated troponin  - Troponin slightly elevated to 0.05 in ED, lowest value she's had in years  - Denies chest pain, SOB, nausea, or diaphoresis  - EKG with new inferolateral T-wave inversions  - Continue cardiac monitoring, check CXR, trend troponin, repeat EKG in am    3. ESRD   - Completed HD on 3/29, usually dialyzes MWF  - No hyperkalemia, hypervolemia, or uremia on admission   4. Hypokalemia  - Serum potassium is 3.1 on admission  - Treated with 20 mEq oral potassium  - Repeat chem panel with mag level in am    5. Hypertension  - BP on low side, orthostatic hypotension noted in ED  - Hold Coreg, lisinopril, and Norvasc initially    6. Macrocytic anemia - Hgb is 7.3 on admission, similar to priors but down from most recent valuse; MCV now increased  - FOBT is negative; she reports increased bleeding from her fistula recently, now stopped  - Type and screen performed in ED  - Check anemia panel, repeat CBC in am    DVT prophylaxis: sq heparin  Code Status: Full  Family Communication: Husband updated at bedside Consults called: None Admission status: Observation   Vianne Bulls, MD [facility] Pager [phone]  If 7PM-7AM, please contact night-coverage [email or web address] Password [facility]  01/28/2018, 12:51 AM

## 2018-01-28 NOTE — Progress Notes (Signed)
[facility]                                                                              Patient Demographics  [name] [name], is a 77 y.o. female, DOB - [birth year], YQM:[id number]  Admit date - 01/27/2018   Admitting Physician Vianne Bulls, MD  Outpatient Primary MD for the patient is Carollee Herter, Alferd Apa, DO  Outpatient specialists:   LOS - 0  days   Medical records reviewed and are as summarized below:    No chief complaint on file.      Brief summary   Patient is a 77 year old female with ESRD on dialysis MWF, type 2 diabetes mellitus, microcytic anemia presented with transient syncopal episode while having dialysis.  Patient reported no chest pain or shortness of breath, palpitations.  She felt lightheaded and dizzy.  Patient also reported that she has had similar episodes in the past during dialysis.  Patient had extensive cardiac workup in January 2019 when she was admitted including 2D echo and stress test which were negative for ischemia.  Assessment & Plan    Principal Problem:   Orthostatic syncope -Patient reported syncope during hemodialysis on 3/29 and has experienced this episodes several times previously with HD. -Patient had extensive cardiac workup in January 29 including echocardiogram which showed preserved EF, moderate LVH, moderate LAE.  Stress test was negative for ischemia. -Discussed with nephrology, Dr. Hollie Salk, recommended 1 unit of packed RBC transfusion and  will contact with outpatient HD to raise her dry weight  Active Problems: ESRD on hemodialysis -Patient completed HD on 3/29 per her schedule, next hemodialysis on 4/1 Appreciate nephrology recommendations    Type II diabetes mellitus with nephropathy ([place]) -CBGs controlled, diet controlled, patient is not on any oral hypoglycemics or insulin at home.    Essential hypertension -Presented with hypotension -Orthostatics checked this morning, still orthostatic.  Hold off on  lisinopril, Coreg, amlodipine -Continue imdur  Hypokalemia -Replace   Mildly elevated troponin with EKG changes on lateral leads -Likely due to acute on chronic anemia and #1 -Repeat EKG ordered - Patient had extensive cardiac workup-in January 2019, was seen by cardiology, had negative stress test.  Was recommended medical management.  Acute on anemia of chronic disease, Macrocytic anemia -B12, folate within normal limits, -Baseline 7.5-8 -Transfuse 1 unit packed RBCs -Stool occult test negative, anemia profile consistent with anemia of chronic disease  Code Status: Full CODE STATUS DVT Prophylaxis: Heparin subcu Family Communication: Discussed in detail with the patient, all imaging results, lab results explained to the patient and husband at the bedside   Disposition Plan:   Time Spent in minutes 25 minutes  Procedures:  None  Consultants:   Nephrology  Antimicrobials:      Medications  Scheduled Meds: . heparin  5,000 Units Subcutaneous Q8H  . isosorbide mononitrate  30 mg Oral BID  . multivitamin with minerals  1 tablet Oral Daily   Continuous Infusions: . sodium chloride     PRN Meds:.acetaminophen, nitroGLYCERIN, ondansetron (ZOFRAN) IV, zolpidem   Antibiotics   Anti-infectives (From admission, onward)   None  Subjective:   [name] was seen and examined today.  Feels a little better this morning. Patient denies dizziness, chest pain, shortness of breath, abdominal pain, N/V/D/C, new weakness, numbess, tingling. No acute events overnight.    Objective:   Vitals:   01/27/18 2042 01/28/18 0115 01/28/18 0253 01/28/18 0612  BP: (!) 131/47 133/89 (!) 140/56 (!) 138/47  Pulse: 75 71 77 70  Resp: 16  17 15   Temp: 98.1 F (36.7 C)  98.6 F (37 C) 98.3 F (36.8 C)  TempSrc: Oral  Oral Oral  SpO2: 100% 100% 100% 100%  Weight:   60.6 kg (133 lb 9.6 oz) 60.6 kg (133 lb 9.6 oz)  Height:   5\' 5"  (1.651 m)     Intake/Output Summary  (Last 24 hours) at 01/28/2018 1114 Last data filed at 01/28/2018 0649 Gross per 24 hour  Intake 370 ml  Output 0 ml  Net 370 ml     Wt Readings from Last 3 Encounters:  01/28/18 60.6 kg (133 lb 9.6 oz)  11/15/17 58.4 kg (128 lb 12 oz)  11/11/17 58.7 kg (129 lb 4.8 oz)     Exam  General: Alert and oriented x 3, NAD  Eyes:   HEENT:    Cardiovascular: S1 S2 auscultated, soft systolic murmur. Regular rate and rhythm.  Respiratory: Clear to auscultation bilaterally, no wheezing, rales or rhonchi  Gastrointestinal: Soft, nontender, nondistended, + bowel sounds  Ext: no pedal edema bilaterally  Neuro: no new deficits  Musculoskeletal: No digital cyanosis, clubbing  Skin: No rashes  Psych: Normal affect and demeanor, alert and oriented x3    Data Reviewed:  I have personally reviewed following labs and imaging studies  Micro Results Recent Results (from the past 240 hour(s))  MRSA PCR Screening     Status: None   Collection Time: 01/28/18  4:33 AM  Result Value Ref Range Status   MRSA by PCR NEGATIVE NEGATIVE Final    Comment:        The GeneXpert MRSA Assay (FDA approved for NASAL specimens only), is one component of a comprehensive MRSA colonization surveillance program. It is not intended to diagnose MRSA infection nor to guide or monitor treatment for MRSA infections. Performed at [facility], [place] [address], [place], [place] [address]     Radiology Reports Dg Chest Port 1 View  Result Date: 01/28/2018 CLINICAL DATA:  Syncope.  Elevated troponin. EXAM: PORTABLE CHEST 1 VIEW COMPARISON:  Chest radiograph November 15, 2017 FINDINGS: Cardiac silhouette is mildly enlarged. Calcified aortic knob. Mild chronic interstitial changes without pleural effusion or focal consolidation. No pneumothorax. Osteopenia. Vascular stent projecting in LEFT humeral soft tissues. IMPRESSION: Mild cardiomegaly.  No acute pulmonary process. Aortic Atherosclerosis  (ICD10-I70.0). Electronically Signed   By: Elon Alas M.D.   On: 01/28/2018 01:52    Lab Data:  CBC: Recent Labs  Lab 01/27/18 1451 01/28/18 0631  WBC 3.4* 3.6*  HGB 7.3* 7.4*  HCT 23.5* 23.4*  MCV 100.9* 100.4*  PLT 92* 84*   Basic Metabolic Panel: Recent Labs  Lab 01/27/18 1451 01/28/18 0154  NA 138 139  K 3.1* 3.1*  CL 96* 96*  CO2 28 27  GLUCOSE 115* 98  BUN 31* 42*  CREATININE 5.50* 6.29*  CALCIUM 8.2* 8.5*  MG  --  1.9   GFR: Estimated Creatinine Clearance: 6.8 mL/min (A) (by C-G formula based on SCr of 6.29 mg/dL (H)). Liver Function Tests: No results for input(s): AST, ALT, ALKPHOS, BILITOT,  PROT, ALBUMIN in the last 168 hours. No results for input(s): LIPASE, AMYLASE in the last 168 hours. No results for input(s): AMMONIA in the last 168 hours. Coagulation Profile: No results for input(s): INR, PROTIME in the last 168 hours. Cardiac Enzymes: Recent Labs  Lab 01/27/18 2245 01/28/18 0154 01/28/18 0808  TROPONINI 0.05* 0.05* 0.04*   BNP (last 3 results) No results for input(s): PROBNP in the last 8760 hours. HbA1C: No results for input(s): HGBA1C in the last 72 hours. CBG: No results for input(s): GLUCAP in the last 168 hours. Lipid Profile: No results for input(s): CHOL, HDL, LDLCALC, TRIG, CHOLHDL, LDLDIRECT in the last 72 hours. Thyroid Function Tests: No results for input(s): TSH, T4TOTAL, FREET4, T3FREE, THYROIDAB in the last 72 hours. Anemia Panel: Recent Labs    01/28/18 0154  VITAMINB12 497  FOLATE >100.0  FERRITIN 731*  TIBC 260  IRON 63  RETICCTPCT 1.9   Urine analysis:    Component Value Date/Time   COLORURINE YELLOW 11/15/2017 1310   APPEARANCEUR CLEAR 11/15/2017 1310   LABSPEC 1.015 11/15/2017 1310   PHURINE 7.0 11/15/2017 1310   GLUCOSEU NEGATIVE 11/15/2017 1310   HGBUR NEGATIVE 11/15/2017 1310   BILIRUBINUR NEGATIVE 11/15/2017 1310   KETONESUR NEGATIVE 11/15/2017 1310   PROTEINUR 100 (A) 11/15/2017 1310    UROBILINOGEN 2.0 (H) 04/01/2008 1751   NITRITE NEGATIVE 11/15/2017 1310   LEUKOCYTESUR NEGATIVE 11/15/2017 1310     Ripudeep Rai M.D. [facility] 01/28/2018, 11:14 AM  Pager: [phone] Between 7am to 7pm - call Pager - [phone]  After 7pm go to [email or web address] - password [email or web address]  Call night coverage person covering after 7pm

## 2018-01-29 ENCOUNTER — Telehealth: Payer: Self-pay | Admitting: *Deleted

## 2018-01-29 DIAGNOSIS — Z9115 Patient's noncompliance with renal dialysis: Secondary | ICD-10-CM | POA: Diagnosis not present

## 2018-01-29 DIAGNOSIS — N186 End stage renal disease: Secondary | ICD-10-CM | POA: Diagnosis not present

## 2018-01-29 DIAGNOSIS — R9431 Abnormal electrocardiogram [ECG] [EKG]: Secondary | ICD-10-CM | POA: Diagnosis not present

## 2018-01-29 DIAGNOSIS — I132 Hypertensive heart and chronic kidney disease with heart failure and with stage 5 chronic kidney disease, or end stage renal disease: Secondary | ICD-10-CM | POA: Diagnosis not present

## 2018-01-29 DIAGNOSIS — D631 Anemia in chronic kidney disease: Secondary | ICD-10-CM | POA: Diagnosis not present

## 2018-01-29 DIAGNOSIS — I700 Atherosclerosis of aorta: Secondary | ICD-10-CM | POA: Diagnosis not present

## 2018-01-29 DIAGNOSIS — Z992 Dependence on renal dialysis: Secondary | ICD-10-CM | POA: Diagnosis not present

## 2018-01-29 DIAGNOSIS — I951 Orthostatic hypotension: Secondary | ICD-10-CM | POA: Diagnosis not present

## 2018-01-29 DIAGNOSIS — E1121 Type 2 diabetes mellitus with diabetic nephropathy: Secondary | ICD-10-CM | POA: Diagnosis not present

## 2018-01-29 DIAGNOSIS — R748 Abnormal levels of other serum enzymes: Secondary | ICD-10-CM | POA: Diagnosis not present

## 2018-01-29 DIAGNOSIS — E1122 Type 2 diabetes mellitus with diabetic chronic kidney disease: Secondary | ICD-10-CM | POA: Diagnosis not present

## 2018-01-29 DIAGNOSIS — I5032 Chronic diastolic (congestive) heart failure: Secondary | ICD-10-CM | POA: Diagnosis not present

## 2018-01-29 LAB — CBC
HCT: 26 % — ABNORMAL LOW (ref 36.0–46.0)
Hemoglobin: 8.5 g/dL — ABNORMAL LOW (ref 12.0–15.0)
MCH: 31.8 pg (ref 26.0–34.0)
MCHC: 32.7 g/dL (ref 30.0–36.0)
MCV: 97.4 fL (ref 78.0–100.0)
PLATELETS: 85 10*3/uL — AB (ref 150–400)
RBC: 2.67 MIL/uL — AB (ref 3.87–5.11)
RDW: 17.1 % — ABNORMAL HIGH (ref 11.5–15.5)
WBC: 3.5 10*3/uL — ABNORMAL LOW (ref 4.0–10.5)

## 2018-01-29 LAB — BPAM RBC
BLOOD PRODUCT EXPIRATION DATE: 201904102359
ISSUE DATE / TIME: 201903311111
UNIT TYPE AND RH: 7300

## 2018-01-29 LAB — BASIC METABOLIC PANEL
Anion gap: 14 (ref 5–15)
BUN: 59 mg/dL — ABNORMAL HIGH (ref 6–20)
CO2: 26 mmol/L (ref 22–32)
CREATININE: 7.65 mg/dL — AB (ref 0.44–1.00)
Calcium: 8.4 mg/dL — ABNORMAL LOW (ref 8.9–10.3)
Chloride: 98 mmol/L — ABNORMAL LOW (ref 101–111)
GFR, EST AFRICAN AMERICAN: 5 mL/min — AB (ref >60)
GFR, EST NON AFRICAN AMERICAN: 5 mL/min — AB (ref >60)
Glucose, Bld: 92 mg/dL (ref 65–99)
Potassium: 4.2 mmol/L (ref 3.5–5.1)
SODIUM: 138 mmol/L (ref 135–145)

## 2018-01-29 LAB — HEPATITIS B SURFACE ANTIGEN: Hepatitis B Surface Ag: NEGATIVE

## 2018-01-29 LAB — TYPE AND SCREEN
ABO/RH(D): B POS
Antibody Screen: NEGATIVE
Unit division: 0

## 2018-01-29 MED ORDER — HEPARIN SODIUM (PORCINE) 1000 UNIT/ML DIALYSIS: 1750.0000 [IU] | Freq: Once | INTRAMUSCULAR | Status: DC

## 2018-01-29 MED ORDER — HEPARIN SODIUM (PORCINE) 1000 UNIT/ML DIALYSIS: 1000.0000 [IU] | INTRAMUSCULAR | Status: DC | PRN

## 2018-01-29 MED ORDER — LIDOCAINE HCL (PF) 1 % IJ SOLN: 5.0000 mL | INTRAMUSCULAR | Status: DC | PRN

## 2018-01-29 MED ORDER — PENTAFLUOROPROP-TETRAFLUOROETH EX AERO: 1.0000 "application " | INHALATION_SPRAY | CUTANEOUS | Status: DC | PRN

## 2018-01-29 MED ORDER — SODIUM CHLORIDE 0.9 % IV SOLN: 100.0000 mL | INTRAVENOUS | Status: DC | PRN

## 2018-01-29 MED ORDER — LIDOCAINE-PRILOCAINE 2.5-2.5 % EX CREA: 1.0000 "application " | TOPICAL_CREAM | CUTANEOUS | Status: DC | PRN

## 2018-01-29 MED ORDER — NITROGLYCERIN 0.4 MG SL SUBL: 0.4000 mg | SUBLINGUAL_TABLET | SUBLINGUAL | 12 refills | Status: DC | PRN

## 2018-01-29 MED ORDER — ALTEPLASE 2 MG IJ SOLR
2.0000 mg | Freq: Once | INTRAMUSCULAR | Status: DC | PRN
Start: 2018-01-29 — End: 2018-01-29

## 2018-01-29 NOTE — Care Management Note (Addendum)
Case Management Note  Patient Details  Name: [name] [name] MRN: [id number] Date of Birth: 1941-10-02  Subjective/Objective:   History of  ESRD on dialysis MWF admitted for Syncope episode while on dialysis.         Action/Plan: Patient with discharge orders home today.  Prior to admission patient lived at home with spouse.  PCP noted. No discharge needs noted at this time.  Expected Discharge Date:  01/29/18               Expected Discharge Plan:   Home, self care  In-House Referral:   N/A  Discharge planning Services   CM consult  Status of Service:    Completed, signed off.  If discussed at [place] of Stay Meetings, dates discussed:    Additional Comments:  Kristen Cardinal, RN  Nurse case [facility] 01/29/2018, 10:02 AM

## 2018-01-29 NOTE — Discharge Summary (Signed)
Physician Discharge Summary   Patient ID: [name] [name] MRN: [id number] DOB/AGE: 04/22/1941 77 y.o.  Admit date: 01/27/2018 Discharge date: 01/29/2018  Primary Care Physician:  Ann Held, DO   Recommendations for Outpatient Follow-up:  1. Follow up with PCP in 1-2 weeks 2. Please obtain BMP/CBC in one week  Home Health: None  Equipment/Devices: none   Discharge Condition: stable CODE STATUS: FULL  Diet recommendation: Renal diet   Discharge Diagnoses:   . Orthostatic syncope . Acute on chronic anemia of chronic disease . End stage renal disease ([place]) on hemodialysis . Essential hypertension . Type II diabetes mellitus with nephropathy ([place]) . Macrocytic anemia . Hypokalemia   Consults: Nephrology    Allergies:   Allergies  Allergen Reactions  . Hydralazine Itching  . Robaxin [Methocarbamol]     Dizziness      DISCHARGE MEDICATIONS: Allergies as of 01/29/2018      Reactions   Hydralazine Itching   Robaxin [methocarbamol]    Dizziness       Medication List    STOP taking these medications   amLODipine 10 MG tablet Commonly known as:  NORVASC   carvedilol 25 MG tablet Commonly known as:  COREG   furosemide 40 MG tablet Commonly known as:  LASIX   lisinopril 40 MG tablet Commonly known as:  PRINIVIL,ZESTRIL     TAKE these medications   calcitRIOL 0.5 MCG capsule Commonly known as:  ROCALTROL Take 3 capsules (1.5 mcg total) by mouth 3 (three) times a week.   ferrous sulfate 325 (65 FE) MG tablet Take 325-650 mg by mouth daily with breakfast.   isosorbide mononitrate 30 MG 24 hr tablet Commonly known as:  IMDUR TAKE 1 TABLET BY MOUTH TWICE A DAY   multivitamin with minerals Tabs tablet Take 1 tablet by mouth daily.   nitroGLYCERIN 0.4 MG SL tablet Commonly known as:  NITROSTAT Place 1 tablet (0.4 mg total) under the tongue every 5 (five) minutes x 3 doses as needed for chest pain.        Brief H and P: For complete  details please refer to admission H and P, but in brief Patient is a 77 year old female with ESRD on dialysis MWF, type 2 diabetes mellitus, microcytic anemia presented with transient syncopal episode while having dialysis.  Patient reported no chest pain or shortness of breath, palpitations.  She felt lightheaded and dizzy.  Patient also reported that she has had similar episodes in the past during dialysis.  Patient had extensive cardiac workup in January 2019 when she was admitted including 2D echo and stress test which were negative for ischemia.   Hospital Course:   Orthostatic syncope -Patient reported syncope during hemodialysis on 3/29 and has experienced this episodes several times previously with HD. -Patient had extensive cardiac workup in January 29 including echocardiogram which showed preserved EF, moderate LVH, moderate LAE.  Stress test was negative for ischemia. -Nephrology, Dr. Hollie Salk (nephrology), recommended 1 unit of packed RBC transfusion and  will contact with outpatient HD to raise her dry weight -H&H stable, hemoglobin 8.5 after 1 unit packed RBC transfusion on 3/31   ESRD on hemodialysis -Patient completed HD on 3/29 per her schedule, next hemodialysis, completing on 4/1 -Cleared by nephrology to be discharged.    Type II diabetes mellitus with nephropathy (HCC) -CBGs controlled, diet controlled, patient is not on any oral hypoglycemics or insulin at home.    Essential hypertension -Presented with hypotension -Orthostatics checked this morning, still orthostatic.  Hold off on lisinopril, Coreg, amlodipine -Continue imdur  Hypokalemia -Replaced   Mildly elevated troponin with EKG changes on lateral leads -Likely due to acute on chronic anemia and #1, repeat EKG showed improvement in the T wave inversions. - Patient had extensive cardiac workup-in January 2019, was seen by cardiology, had negative stress test.    Patient was recommended medical  management.  Acute on anemia of chronic disease, Macrocytic anemia -B12, folate within normal limits, -Baseline 7.5-8 -Transfuse 1 unit packed RBC on 3/31 for hemoglobin of 7.4, hemoglobin improved to 8.5 -Stool occult test negative, anemia profile consistent with anemia of chronic disease     Day of Discharge S: Feels a lot better today, hoping to go home after dialysis  BP (!) 137/48   Pulse 71   Temp 97.7 F (36.5 C) (Oral)   Resp 18   Ht 5\' 5"  (1.651 m)   Wt 61.2 kg (134 lb 14.7 oz)   SpO2 100%   BMI 22.45 kg/m   Physical Exam: General: Alert and awake oriented x3 not in any acute distress. HEENT: anicteric sclera, pupils reactive to light and accommodation CVS: S1-S2 clear no murmur rubs or gallops Chest: clear to auscultation bilaterally, no wheezing rales or rhonchi Abdomen: soft nontender, nondistended, normal bowel sounds Extremities: no cyanosis, clubbing or edema noted bilaterally Neuro: Cranial nerves II-XII intact, no focal neurological deficits   The results of significant diagnostics from this hospitalization (including imaging, microbiology, ancillary and laboratory) are listed below for reference.      Procedures/Studies:  Dg Chest Port 1 View  Result Date: 01/28/2018 CLINICAL DATA:  Syncope.  Elevated troponin. EXAM: PORTABLE CHEST 1 VIEW COMPARISON:  Chest radiograph November 15, 2017 FINDINGS: Cardiac silhouette is mildly enlarged. Calcified aortic knob. Mild chronic interstitial changes without pleural effusion or focal consolidation. No pneumothorax. Osteopenia. Vascular stent projecting in LEFT humeral soft tissues. IMPRESSION: Mild cardiomegaly.  No acute pulmonary process. Aortic Atherosclerosis (ICD10-I70.0). Electronically Signed   By: Elon Alas M.D.   On: 01/28/2018 01:52       LAB RESULTS: Basic Metabolic Panel: Recent Labs  Lab 01/28/18 0154 01/29/18 0529  NA 139 138  K 3.1* 4.2  CL 96* 98*  CO2 27 26  GLUCOSE 98 92   BUN 42* 59*  CREATININE 6.29* 7.65*  CALCIUM 8.5* 8.4*  MG 1.9  --    Liver Function Tests: No results for input(s): AST, ALT, ALKPHOS, BILITOT, PROT, ALBUMIN in the last 168 hours. No results for input(s): LIPASE, AMYLASE in the last 168 hours. No results for input(s): AMMONIA in the last 168 hours. CBC: Recent Labs  Lab 01/28/18 0631 01/28/18 1649 01/29/18 0529  WBC 3.6*  --  3.5*  HGB 7.4* 8.0* 8.5*  HCT 23.4* 25.2* 26.0*  MCV 100.4*  --  97.4  PLT 84*  --  85*   Cardiac Enzymes: Recent Labs  Lab 01/28/18 0808 01/28/18 1648  TROPONINI 0.04* 0.04*   BNP: Invalid input(s): POCBNP CBG: No results for input(s): GLUCAP in the last 168 hours.    Disposition and Follow-up: Discharge Instructions    Diet - low sodium heart healthy   Complete by:  As directed    Discharge instructions   Complete by:  As directed    Please check your BP daily, sitting and standing. Keep a log of your BP readings, bring the log to follow with your doctor in 7-10 days. Until then, please HOLD amlodipine, lasix, coreg and lisinopril.   Increase  activity slowly   Complete by:  As directed        DISPOSITION: Home   DISCHARGE FOLLOW-UP Follow-up Information    Ann Held, DO. Schedule an appointment as soon as possible for a visit in 7 day(s).   Specialty:  Family Medicine Contact information: [phone] [place] [address] [phone]        Josue Hector, MD. Schedule an appointment as soon as possible for a visit in 2 week(s).   Specialty:  Cardiology Contact information: [address] [place] [address] [phone]            Time coordinating discharge:  35 minutes  Signed:   Estill Cotta M.D. [facility] 01/29/2018, 1:30 PM Pager: [phone]

## 2018-01-29 NOTE — Telephone Encounter (Signed)
Called patient and left message to return call

## 2018-01-29 NOTE — Progress Notes (Addendum)
  [facility] Progress Note   Subjective:  Seen in room. No CP, dyspnea, pre-syncope sensation now. For HD today.  Wants to transfer to another HD center. Told this would not happen as inpatient. Notified her OP HD unit to work on this.  Objective Vitals:   01/28/18 1445 01/28/18 2040 01/29/18 0055 01/29/18 0803  BP: (!) 153/51 99/68 (!) 119/53 (!) 150/58  Pulse: 80 65 66 68  Resp: 15 17 18 18   Temp: 98.4 F (36.9 C) 98.6 F (37 C) 98.4 F (36.9 C) 98.1 F (36.7 C)  TempSrc: Oral Oral Oral Oral  SpO2: 100% 96% 99% 100%  Weight:  60.6 kg (133 lb 9.6 oz)    Height:       Physical Exam General: Well appearing, NAD Heart: RRR; 3/6 systolic murmur Lungs: CTAB Abdomen: soft, non-tender Extremities: No LE edema Dialysis Access: LUE AVG + bruit  Additional Objective Labs: Basic Metabolic Panel: Recent Labs  Lab 01/27/18 1451 01/28/18 0154 01/29/18 0529  NA 138 139 138  K 3.1* 3.1* 4.2  CL 96* 96* 98*  CO2 28 27 26   GLUCOSE 115* 98 92  BUN 31* 42* 59*  CREATININE 5.50* 6.29* 7.65*  CALCIUM 8.2* 8.5* 8.4*   CBC: Recent Labs  Lab 01/27/18 1451 01/28/18 0631 01/28/18 1649 01/29/18 0529  WBC 3.4* 3.6*  --  3.5*  HGB 7.3* 7.4* 8.0* 8.5*  HCT 23.5* 23.4* 25.2* 26.0*  MCV 100.9* 100.4*  --  97.4  PLT 92* 84*  --  85*   Iron Studies:  Recent Labs    01/28/18 0154  IRON 63  TIBC 260  FERRITIN 731*   Studies/Results: Dg Chest Port 1 View  Result Date: 01/28/2018 CLINICAL DATA:  Syncope.  Elevated troponin. EXAM: PORTABLE CHEST 1 VIEW COMPARISON:  Chest radiograph November 15, 2017 FINDINGS: Cardiac silhouette is mildly enlarged. Calcified aortic knob. Mild chronic interstitial changes without pleural effusion or focal consolidation. No pneumothorax. Osteopenia. Vascular stent projecting in LEFT humeral soft tissues. IMPRESSION: Mild cardiomegaly.  No acute pulmonary process. Aortic Atherosclerosis (ICD10-I70.0). Electronically Signed   By: Elon Alas M.D.   On: 01/28/2018 01:52   Medications: . sodium chloride    . sodium chloride    . sodium chloride     . heparin  1,800 Units Dialysis Once in dialysis  . heparin  5,000 Units Subcutaneous Q8H  . isosorbide mononitrate  30 mg Oral BID  . multivitamin with minerals  1 tablet Oral Daily    Dialysis Orders: [facility] MWF 3 hr 30 min  F180 AVG EDW 59 kg 2K/ 2Ca bath 1750 bolus heparin Mircera 200 mcg q 2 weeks Calcitriol 0.5 mcg q rx  Assessment/Plan: 1. Syncope: Raising EDW. HD today to make sure tolerates. 2. ESRD: MWF schedule. 3. HTN/volume: BP good, no edema on exam. Follow with new EDW ~ 61kg. 4. Anemia: Hgb 7.4 on admit, now s/p 1U PRBCs 3/31. 5. Prolonged bleeding from AVF: Will hold heparin with HD and will discuss with HD unit to set up outpatient f'gram if persists.  Veneta Penton, PA-C 01/29/2018, 10:26 AM  [facility] Pager: [phone]  Pt seen, examined and agree w A/P as above.  OK for dc after HD today from renal standpoint.  Kelly Splinter MD [employer] pager [phone]   01/29/2018, 1:40 PM

## 2018-01-29 NOTE — Progress Notes (Signed)
Pt discharge to home. Discharge instruction discussed and reviewed with pt, verbalized understanding. Pt left the unit in wheelchair with all belongings with her.

## 2018-01-30 NOTE — Telephone Encounter (Signed)
Patient ID: [name] [name] MRN: [id number] DOB/AGE: 77-Dec-1942 77 y.o.  Admit date: 01/27/2018 Discharge date: 01/29/2018  Primary Care Physician:  Ann Held, DO   Recommendations for Outpatient Follow-up:  1. Follow up with PCP in 1-2 weeks 2. Please obtain BMP/CBC in one week  Home Health: None  Equipment/Devices: none   Discharge Condition: stable CODE STATUS: FULL  Diet recommendation: Renal diet  Transition Care Management Follow-up Telephone Call    How have you been since you were released from the hospital? Doing well since I got home.   Do you understand why you were in the hospital? yes   Do you understand the discharge instructions? yes   Where were you discharged to? Home with husband.   Items Reviewed:  Medications reviewed: Can't find her list, but states she will bring to appt on Thursday  Allergies reviewed: yes  Dietary changes reviewed: yes  Referrals reviewed: yes   Functional Questionnaire:   Activities of Daily Living (ADLs):   She states they are independent in the following: ambulation, bathing and hygiene, feeding, continence, grooming, toileting and dressing States they require assistance with the following: n/a   Any transportation issues/concerns?: no. States her daughter can bring her.   Any patient concerns? no   Confirmed importance and date/time of follow-up visits scheduled yes  Provider Appointment booked with Dr.Lowne 02/01/18 @1130   Confirmed with patient if condition begins to worsen call PCP or go to the ER.  Patient was given the office number and encouraged to call back with question or concerns.  : yes

## 2018-01-30 NOTE — Telephone Encounter (Signed)
Reviewed  Azriel Jakob R Lowne Chase, DO  

## 2018-01-31 DIAGNOSIS — N186 End stage renal disease: Secondary | ICD-10-CM | POA: Diagnosis not present

## 2018-01-31 DIAGNOSIS — N2581 Secondary hyperparathyroidism of renal origin: Secondary | ICD-10-CM | POA: Diagnosis not present

## 2018-02-01 ENCOUNTER — Inpatient Hospital Stay: Payer: Medicare HMO | Admitting: Family Medicine

## 2018-02-02 DIAGNOSIS — N186 End stage renal disease: Secondary | ICD-10-CM | POA: Diagnosis not present

## 2018-02-02 DIAGNOSIS — N2581 Secondary hyperparathyroidism of renal origin: Secondary | ICD-10-CM | POA: Diagnosis not present

## 2018-02-05 ENCOUNTER — Encounter: Payer: Self-pay | Admitting: Family Medicine

## 2018-02-05 DIAGNOSIS — N2581 Secondary hyperparathyroidism of renal origin: Secondary | ICD-10-CM | POA: Diagnosis not present

## 2018-02-05 DIAGNOSIS — N186 End stage renal disease: Secondary | ICD-10-CM | POA: Diagnosis not present

## 2018-02-06 DIAGNOSIS — N186 End stage renal disease: Secondary | ICD-10-CM | POA: Diagnosis not present

## 2018-02-06 DIAGNOSIS — N2581 Secondary hyperparathyroidism of renal origin: Secondary | ICD-10-CM | POA: Diagnosis not present

## 2018-02-07 DIAGNOSIS — N2581 Secondary hyperparathyroidism of renal origin: Secondary | ICD-10-CM | POA: Diagnosis not present

## 2018-02-07 DIAGNOSIS — N186 End stage renal disease: Secondary | ICD-10-CM | POA: Diagnosis not present

## 2018-02-08 DIAGNOSIS — T82858A Stenosis of vascular prosthetic devices, implants and grafts, initial encounter: Secondary | ICD-10-CM | POA: Diagnosis not present

## 2018-02-08 DIAGNOSIS — N186 End stage renal disease: Secondary | ICD-10-CM | POA: Diagnosis not present

## 2018-02-08 DIAGNOSIS — Z992 Dependence on renal dialysis: Secondary | ICD-10-CM | POA: Diagnosis not present

## 2018-02-08 DIAGNOSIS — I871 Compression of vein: Secondary | ICD-10-CM | POA: Diagnosis not present

## 2018-02-09 DIAGNOSIS — N2581 Secondary hyperparathyroidism of renal origin: Secondary | ICD-10-CM | POA: Diagnosis not present

## 2018-02-09 DIAGNOSIS — N186 End stage renal disease: Secondary | ICD-10-CM | POA: Diagnosis not present

## 2018-02-12 DIAGNOSIS — N2581 Secondary hyperparathyroidism of renal origin: Secondary | ICD-10-CM | POA: Diagnosis not present

## 2018-02-12 DIAGNOSIS — N186 End stage renal disease: Secondary | ICD-10-CM | POA: Diagnosis not present

## 2018-02-14 DIAGNOSIS — N186 End stage renal disease: Secondary | ICD-10-CM | POA: Diagnosis not present

## 2018-02-14 DIAGNOSIS — N2581 Secondary hyperparathyroidism of renal origin: Secondary | ICD-10-CM | POA: Diagnosis not present

## 2018-02-16 DIAGNOSIS — N2581 Secondary hyperparathyroidism of renal origin: Secondary | ICD-10-CM | POA: Diagnosis not present

## 2018-02-16 DIAGNOSIS — N186 End stage renal disease: Secondary | ICD-10-CM | POA: Diagnosis not present

## 2018-02-19 DIAGNOSIS — N2581 Secondary hyperparathyroidism of renal origin: Secondary | ICD-10-CM | POA: Diagnosis not present

## 2018-02-19 DIAGNOSIS — N186 End stage renal disease: Secondary | ICD-10-CM | POA: Diagnosis not present

## 2018-02-21 DIAGNOSIS — N186 End stage renal disease: Secondary | ICD-10-CM | POA: Diagnosis not present

## 2018-02-21 DIAGNOSIS — N2581 Secondary hyperparathyroidism of renal origin: Secondary | ICD-10-CM | POA: Diagnosis not present

## 2018-02-23 ENCOUNTER — Other Ambulatory Visit: Payer: Self-pay | Admitting: Family Medicine

## 2018-02-23 DIAGNOSIS — N184 Chronic kidney disease, stage 4 (severe): Secondary | ICD-10-CM

## 2018-02-23 DIAGNOSIS — N186 End stage renal disease: Secondary | ICD-10-CM | POA: Diagnosis not present

## 2018-02-23 DIAGNOSIS — N2581 Secondary hyperparathyroidism of renal origin: Secondary | ICD-10-CM | POA: Diagnosis not present

## 2018-02-26 DIAGNOSIS — N2581 Secondary hyperparathyroidism of renal origin: Secondary | ICD-10-CM | POA: Diagnosis not present

## 2018-02-26 DIAGNOSIS — N186 End stage renal disease: Secondary | ICD-10-CM | POA: Diagnosis not present

## 2018-02-28 DIAGNOSIS — N2581 Secondary hyperparathyroidism of renal origin: Secondary | ICD-10-CM | POA: Diagnosis not present

## 2018-02-28 DIAGNOSIS — N186 End stage renal disease: Secondary | ICD-10-CM | POA: Diagnosis not present

## 2018-03-01 ENCOUNTER — Ambulatory Visit: Payer: Medicare HMO | Admitting: Family Medicine

## 2018-03-01 ENCOUNTER — Encounter: Payer: Self-pay | Admitting: Family Medicine

## 2018-03-01 VITALS — BP 100/42 | HR 65 | Temp 97.9°F | Resp 16 | Wt 136.8 lb

## 2018-03-01 DIAGNOSIS — D638 Anemia in other chronic diseases classified elsewhere: Secondary | ICD-10-CM | POA: Diagnosis not present

## 2018-03-01 DIAGNOSIS — Z992 Dependence on renal dialysis: Secondary | ICD-10-CM | POA: Diagnosis not present

## 2018-03-01 DIAGNOSIS — E1121 Type 2 diabetes mellitus with diabetic nephropathy: Secondary | ICD-10-CM

## 2018-03-01 DIAGNOSIS — E785 Hyperlipidemia, unspecified: Secondary | ICD-10-CM

## 2018-03-01 DIAGNOSIS — N186 End stage renal disease: Secondary | ICD-10-CM

## 2018-03-01 DIAGNOSIS — N184 Chronic kidney disease, stage 4 (severe): Secondary | ICD-10-CM | POA: Diagnosis not present

## 2018-03-01 DIAGNOSIS — R55 Syncope and collapse: Secondary | ICD-10-CM | POA: Diagnosis not present

## 2018-03-01 DIAGNOSIS — I10 Essential (primary) hypertension: Secondary | ICD-10-CM | POA: Diagnosis not present

## 2018-03-01 DIAGNOSIS — Z91158 Patient's noncompliance with renal dialysis for other reason: Secondary | ICD-10-CM

## 2018-03-01 DIAGNOSIS — Z9115 Patient's noncompliance with renal dialysis: Secondary | ICD-10-CM | POA: Diagnosis not present

## 2018-03-01 LAB — BASIC METABOLIC PANEL
BUN: 31 mg/dL — ABNORMAL HIGH (ref 6–23)
CALCIUM: 9.3 mg/dL (ref 8.4–10.5)
CO2: 34 meq/L — AB (ref 19–32)
CREATININE: 5.54 mg/dL — AB (ref 0.40–1.20)
Chloride: 96 mEq/L (ref 96–112)
GFR: 9.6 mL/min — CL (ref >60.00)
Glucose, Bld: 131 mg/dL — ABNORMAL HIGH (ref 70–99)
Potassium: 3.9 mEq/L (ref 3.5–5.1)
Sodium: 139 mEq/L (ref 135–145)

## 2018-03-01 LAB — CBC WITH DIFFERENTIAL/PLATELET
BASOS PCT: 1.1 % (ref 0.0–3.0)
Basophils Absolute: 0 10*3/uL (ref 0.0–0.1)
EOS PCT: 2.6 % (ref 0.0–5.0)
Eosinophils Absolute: 0.1 10*3/uL (ref 0.0–0.7)
HCT: 31.7 % — ABNORMAL LOW (ref 36.0–46.0)
Hemoglobin: 10.3 g/dL — ABNORMAL LOW (ref 12.0–15.0)
LYMPHS ABS: 0.7 10*3/uL (ref 0.7–4.0)
Lymphocytes Relative: 20.7 % (ref 12.0–46.0)
MCHC: 32.4 g/dL (ref 30.0–36.0)
MCV: 101.7 fl — ABNORMAL HIGH (ref 78.0–100.0)
MONOS PCT: 10.9 % (ref 3.0–12.0)
Monocytes Absolute: 0.4 10*3/uL (ref 0.1–1.0)
NEUTROS PCT: 64.7 % (ref 43.0–77.0)
Neutro Abs: 2.2 10*3/uL (ref 1.4–7.7)
Platelets: 103 10*3/uL — ABNORMAL LOW (ref 150.0–400.0)
RBC: 3.12 Mil/uL — AB (ref 3.87–5.11)
RDW: 19.9 % — AB (ref 11.5–15.5)
WBC: 3.3 10*3/uL — ABNORMAL LOW (ref 4.0–10.5)

## 2018-03-01 LAB — LIPID PANEL
CHOL/HDL RATIO: 3
Cholesterol: 203 mg/dL — ABNORMAL HIGH (ref 0–200)
HDL: 63.3 mg/dL (ref >39.00)
LDL Cholesterol: 118 mg/dL — ABNORMAL HIGH (ref 0–99)
NONHDL: 140.18
Triglycerides: 110 mg/dL (ref 0.0–149.0)
VLDL: 22 mg/dL (ref 0.0–40.0)

## 2018-03-01 NOTE — Patient Instructions (Signed)
Syncope Syncope is when you temporarily lose consciousness. Syncope may also be called fainting or passing out. It is caused by a sudden decrease in blood flow to the brain. Even though most causes of syncope are not dangerous, syncope can be a sign of a serious medical problem. Signs that you may be about to faint include:  Feeling dizzy or light-headed.  Feeling nauseous.  Seeing all white or all black in your field of vision.  Having cold, clammy skin.  If you fainted, get medical help right away.Call your local emergency services (911 in the [place] Do not drive yourself to the hospital. Follow these instructions at home: Pay attention to any changes in your symptoms. Take these actions to help with your condition:  Have someone stay with you until you feel stable.  Do not drive, use machinery, or play sports until your health care provider says it is okay.  Keep all follow-up visits as told by your health care provider. This is important.  If you start to feel like you might faint, lie down right away and raise (elevate) your feet above the level of your heart. Breathe deeply and steadily. Wait until all of the symptoms have passed.  Drink enough fluid to keep your urine clear or pale yellow.  If you are taking blood pressure or heart medicine, get up slowly and take several minutes to sit and then stand. This can reduce dizziness.  Take over-the-counter and prescription medicines only as told by your health care provider.  Get help right away if:  You have a severe headache.  You have unusual pain in your chest, abdomen, or back.  You are bleeding from your mouth or rectum, or you have black or tarry stool.  You have a very fast or irregular heartbeat (palpitations).  You have pain with breathing.  You faint once or repeatedly.  You have a seizure.  You are confused.  You have trouble walking.  You have severe weakness.  You have vision problems. These  symptoms may represent a serious problem that is an emergency. Do not wait to see if your symptoms will go away. Get medical help right away. Call your local emergency services (911 in the [place] Do not drive yourself to the hospital. This information is not intended to replace advice given to you by your health care provider. Make sure you discuss any questions you have with your health care provider. Document Released: 10/17/2005 Document Revised: 03/24/2016 Document Reviewed: 07/01/2015 Elsevier Interactive Patient Education  2018 [employer]  

## 2018-03-01 NOTE — Assessment & Plan Note (Signed)
Dialysis 3days a week per nephrology

## 2018-03-01 NOTE — Assessment & Plan Note (Signed)
Lab Results  Component Value Date   HGB 8.5 (L) 01/29/2018    Lab Results  Component Value Date   HGBA1C 4.8 11/15/2017

## 2018-03-01 NOTE — Assessment & Plan Note (Signed)
Well controlled, no changes to meds. Encouraged heart healthy diet such as the DASH diet and exercise as tolerated.  °

## 2018-03-01 NOTE — Assessment & Plan Note (Signed)
Per nephrology On dialysis

## 2018-03-01 NOTE — Progress Notes (Addendum)
Patient ID: [name] [name], female   DOB: Jul 12, 1941, 77 y.o.   MRN: [id number]     Subjective:  I acted as a Education administrator for Dr. Carollee Herter.  [name] [name], [name]   Patient ID: [name] [name], female    DOB: [birth year], 77 y.o.   MRN: [id number]  Chief Complaint  Patient presents with  . Hospitalization Follow-up    01/28/18-01/29/18 (observation) syncope    HPI  Patient is in today for ED follow up.  She had passed out at dialysis.  She was admitted on 3/30 and d/c 4/1 --  Her dry weight was elevated   She was transfused in hosp and hgb on d/c was 8.5  Pt is feeling better and has felt better since dry weight was elevated.    Patient Care Team: Carollee Herter, Alferd Apa, DO as PCP - General (Family Medicine) Josue Hector, MD as PCP - Cardiology (Cardiology) Calvert Cantor, MD as Consulting Physician (Ophthalmology) Sherlynn Stalls, MD as Consulting Physician (Ophthalmology) Royann Shivers, MD as Referring Physician (Nephrology) [name], MD as Consulting Physician (Nephrology)   Past Medical History:  Diagnosis Date  . DEPRESSION   . DIABETES MELLITUS, TYPE I   . GERD   . GLAUCOMA   . Headache(784.0)   . Hepatitis B carrier ([place])    05/2009: neg Hep C; Hep B: core pos, Surf neg; fatty liver US 8/10 - 7/13  . HYPERLIPIDEMIA   . HYPERTENSION   . Kidney failure    dialysis 3 times per week  . OSTEOPENIA   . POSTMENOPAUSAL STATUS   . Pulmonary edema   . Unspecified vitamin D deficiency     Past Surgical History:  Procedure Laterality Date  . AV FISTULA PLACEMENT Left 05/29/2014   Procedure: INSERTION OF ARTERIOVENOUS (AV) GORE-TEX GRAFT ARM-LEFT;  Surgeon: Angelia Mould, MD;  Location: [facility];  Service: Vascular;  Laterality: Left;  . CHOLECYSTECTOMY  02/12/07  . INSERTION OF DIALYSIS CATHETER Right 05/27/2014   Procedure: INSERTION OF DIALYSIS CATHETER;  Surgeon: Angelia Mould, MD;  Location: [facility];  Service: Vascular;  Laterality: Right;  . REFRACTIVE  SURGERY  07/29/09   Dr. Bing Plume  . TONSILLECTOMY      Family History  Problem Relation Age of Onset  . Kidney disease Mother   . Coronary artery disease Other   . Diabetes Other   . Hypertension Other   . Arthritis Other   . Kidney disease Sister     Social History   Socioeconomic History  . Marital status: Married    Spouse name: Not on file  . Number of children: Not on file  . Years of education: Not on file  . Highest education level: Not on file  Occupational History  . Not on file  Social Needs  . Financial resource strain: Not on file  . Food insecurity:    Worry: Not on file    Inability: Not on file  . Transportation needs:    Medical: Not on file    Non-medical: Not on file  Tobacco Use  . Smoking status: Never Smoker  . Smokeless tobacco: Never Used  . Tobacco comment: Married x's 3yrs, 6 kids-scattered [employer]. Retired Consulting civil engineer  Substance and Sexual Activity  . Alcohol use: No  . Drug use: No  . Sexual activity: Not Currently    Birth control/protection: None  Lifestyle  . Physical activity:    Days per week: Not on file    Minutes per  session: Not on file  . Stress: Not on file  Relationships  . Social connections:    Talks on phone: Not on file    Gets together: Not on file    Attends religious service: Not on file    Active member of club or organization: Not on file    Attends meetings of clubs or organizations: Not on file    Relationship status: Not on file  . Intimate partner violence:    Fear of current or ex partner: Not on file    Emotionally abused: Not on file    Physically abused: Not on file    Forced sexual activity: Not on file  Other Topics Concern  . Not on file  Social History Narrative  . Not on file    Outpatient Medications Prior to Visit  Medication Sig Dispense Refill  . calcitRIOL (ROCALTROL) 0.5 MCG capsule Take 3 capsules (1.5 mcg total) by mouth 3 (three) times a week. 15 capsule 0  . ferrous sulfate 325  (65 FE) MG tablet Take 325-650 mg by mouth daily with breakfast.    . isosorbide mononitrate (IMDUR) 30 MG 24 hr tablet TAKE 1 TABLET BY MOUTH TWICE A DAY 180 tablet 0  . Multiple Vitamin (MULTIVITAMIN WITH MINERALS) TABS tablet Take 1 tablet by mouth daily.     . nitroGLYCERIN (NITROSTAT) 0.4 MG SL tablet Place 1 tablet (0.4 mg total) under the tongue every 5 (five) minutes x 3 doses as needed for chest pain. 30 tablet 12   No facility-administered medications prior to visit.     Allergies  Allergen Reactions  . Hydralazine Itching  . Robaxin [Methocarbamol]     Dizziness     Review of Systems  Constitutional: Negative for fever and malaise/fatigue.  HENT: Negative for congestion.   Eyes: Negative for blurred vision.  Respiratory: Negative for cough and shortness of breath.   Cardiovascular: Negative for chest pain, palpitations and leg swelling.  Gastrointestinal: Negative for vomiting.  Musculoskeletal: Negative for back pain.  Skin: Negative for rash.  Neurological: Negative for loss of consciousness and headaches.       Objective:    Physical Exam  Constitutional: She is oriented to person, place, and time. She appears well-developed and well-nourished.  HENT:  Head: Normocephalic and atraumatic.  Eyes: Conjunctivae and EOM are normal.  Neck: Normal range of motion. Neck supple. No JVD present. Carotid bruit is not present. No thyromegaly present.  Cardiovascular: Normal rate and regular rhythm.  Murmur heard. Pulmonary/Chest: Effort normal and breath sounds normal. No respiratory distress. She has no wheezes. She has no rales. She exhibits no tenderness.  Musculoskeletal: She exhibits no edema.  Neurological: She is alert and oriented to person, place, and time.  Psychiatric: She has a normal mood and affect.  Nursing note and vitals reviewed.   BP (!) 100/42 (BP Location: Right Arm, Cuff Size: Normal)   Pulse 65   Temp 97.9 F (36.6 C) (Oral)   Resp 16   Wt  136 lb 12.8 oz (62.1 kg)   SpO2 99%   BMI 22.76 kg/m  Wt Readings from Last 3 Encounters:  03/01/18 136 lb 12.8 oz (62.1 kg)  01/29/18 134 lb 7.7 oz (61 kg)  11/15/17 128 lb 12 oz (58.4 kg)   BP Readings from Last 3 Encounters:  03/01/18 (!) 100/42  01/29/18 (!) 129/39  01/27/18 (!) 108/45     Immunization History  Administered Date(s) Administered  . Influenza Split 08/26/2011, 08/28/2012  .  Influenza Whole 09/13/2007, 09/15/2009, 08/13/2010  . Influenza, High Dose Seasonal PF 09/03/2013  . Influenza-Unspecified 08/31/2014  . Pneumococcal Conjugate-13 06/16/2014  . Pneumococcal Polysaccharide-23 12/21/2007    Health Maintenance  Topic Date Due  . TETANUS/TDAP  10/29/1960  . URINE MICROALBUMIN  01/02/2009  . OPHTHALMOLOGY EXAM  12/06/2013  . FOOT EXAM  04/26/2014  . HEMOGLOBIN A1C  05/15/2018  . INFLUENZA VACCINE  05/31/2018  . DEXA SCAN  Completed  . PNA vac Low Risk Adult  Completed    Lab Results  Component Value Date   WBC 3.3 (L) 03/01/2018   HGB 10.3 (L) 03/01/2018   HCT 31.7 (L) 03/01/2018   PLT 103.0 (L) 03/01/2018   GLUCOSE 131 (H) 03/01/2018   CHOL 203 (H) 03/01/2018   TRIG 110.0 03/01/2018   HDL 63.30 03/01/2018   LDLDIRECT 196.2 04/26/2013   LDLCALC 118 (H) 03/01/2018   ALT 34 11/15/2017   AST 57 (H) 11/15/2017   NA 139 03/01/2018   K 3.9 03/01/2018   CL 96 03/01/2018   CREATININE 5.54 (HH) 03/01/2018   BUN 31 (H) 03/01/2018   CO2 34 (H) 03/01/2018   TSH 0.536 11/15/2017   INR 1.05 07/20/2016   HGBA1C 4.8 11/15/2017   MICROALBUR 174.7 (H) 01/03/2008    Lab Results  Component Value Date   TSH 0.536 11/15/2017   Lab Results  Component Value Date   WBC 3.3 (L) 03/01/2018   HGB 10.3 (L) 03/01/2018   HCT 31.7 (L) 03/01/2018   MCV 101.7 (H) 03/01/2018   PLT 103.0 (L) 03/01/2018   Lab Results  Component Value Date   NA 139 03/01/2018   K 3.9 03/01/2018   CO2 34 (H) 03/01/2018   GLUCOSE 131 (H) 03/01/2018   BUN 31 (H) 03/01/2018    CREATININE 5.54 (HH) 03/01/2018   BILITOT 0.9 11/15/2017   ALKPHOS 132 (H) 11/15/2017   AST 57 (H) 11/15/2017   ALT 34 11/15/2017   PROT 8.2 (H) 11/15/2017   ALBUMIN 3.2 (L) 11/15/2017   CALCIUM 9.3 03/01/2018   ANIONGAP 14 01/29/2018   GFR 9.60 (LL) 03/01/2018   Lab Results  Component Value Date   CHOL 203 (H) 03/01/2018   Lab Results  Component Value Date   HDL 63.30 03/01/2018   Lab Results  Component Value Date   LDLCALC 118 (H) 03/01/2018   Lab Results  Component Value Date   TRIG 110.0 03/01/2018   Lab Results  Component Value Date   CHOLHDL 3 03/01/2018   Lab Results  Component Value Date   HGBA1C 4.8 11/15/2017         Assessment & Plan:   Problem List Items Addressed This Visit      Unprioritized   Anemia, chronic disease    Per nephrology Check labs today      Relevant Orders   CBC with Differential/Platelet (Completed)   Basic metabolic panel (Completed)   Dialysis patient, noncompliant ([facility])    Dialysis 3days a week per nephrology      ESRD (end stage renal disease) on dialysis ([place]) - Primary   Relevant Orders   CBC with Differential/Platelet (Completed)   Basic metabolic panel (Completed)   Essential hypertension    Well controlled, no changes to meds. Encouraged heart healthy diet such as the DASH diet and exercise as tolerated.       Hyperlipidemia LDL goal <70    Encouraged heart healthy diet, increase exercise, avoid trans fats, consider a krill oil cap daily  Type II diabetes mellitus with nephropathy (HCC)    Lab Results  Component Value Date   HGB 8.5 (L) 01/29/2018    Lab Results  Component Value Date   HGBA1C 4.8 11/15/2017         Relevant Orders   Lipid panel (Completed)   Vasovagal syncope    Feeling much better since dry weight increased         I am having [name] maintain her multivitamin with minerals, ferrous sulfate, calcitRIOL, nitroGLYCERIN, and isosorbide mononitrate.  No orders  of the defined types were placed in this encounter.   CMA served as Education administrator during this visit. History, Physical and Plan performed by medical provider. Documentation and orders reviewed and attested to.  Ann Held, DO

## 2018-03-01 NOTE — Assessment & Plan Note (Signed)
Feeling much better since dry weight increased

## 2018-03-01 NOTE — Assessment & Plan Note (Signed)
Encouraged heart healthy diet, increase exercise, avoid trans fats, consider a krill oil cap daily 

## 2018-03-01 NOTE — Assessment & Plan Note (Signed)
Per nephrology Check labs today 

## 2018-03-02 DIAGNOSIS — N186 End stage renal disease: Secondary | ICD-10-CM | POA: Diagnosis not present

## 2018-03-02 DIAGNOSIS — N2581 Secondary hyperparathyroidism of renal origin: Secondary | ICD-10-CM | POA: Diagnosis not present

## 2018-03-05 DIAGNOSIS — N186 End stage renal disease: Secondary | ICD-10-CM | POA: Diagnosis not present

## 2018-03-05 DIAGNOSIS — N2581 Secondary hyperparathyroidism of renal origin: Secondary | ICD-10-CM | POA: Diagnosis not present

## 2018-03-07 DIAGNOSIS — N2581 Secondary hyperparathyroidism of renal origin: Secondary | ICD-10-CM | POA: Diagnosis not present

## 2018-03-07 DIAGNOSIS — N186 End stage renal disease: Secondary | ICD-10-CM | POA: Diagnosis not present

## 2018-03-09 DIAGNOSIS — N2581 Secondary hyperparathyroidism of renal origin: Secondary | ICD-10-CM | POA: Diagnosis not present

## 2018-03-09 DIAGNOSIS — N186 End stage renal disease: Secondary | ICD-10-CM | POA: Diagnosis not present

## 2018-03-12 DIAGNOSIS — N2581 Secondary hyperparathyroidism of renal origin: Secondary | ICD-10-CM | POA: Diagnosis not present

## 2018-03-12 DIAGNOSIS — N186 End stage renal disease: Secondary | ICD-10-CM | POA: Diagnosis not present

## 2018-03-14 ENCOUNTER — Encounter: Payer: Self-pay | Admitting: *Deleted

## 2018-03-14 DIAGNOSIS — N186 End stage renal disease: Secondary | ICD-10-CM | POA: Diagnosis not present

## 2018-03-14 DIAGNOSIS — N2581 Secondary hyperparathyroidism of renal origin: Secondary | ICD-10-CM | POA: Diagnosis not present

## 2018-03-16 DIAGNOSIS — N186 End stage renal disease: Secondary | ICD-10-CM | POA: Diagnosis not present

## 2018-03-16 DIAGNOSIS — N2581 Secondary hyperparathyroidism of renal origin: Secondary | ICD-10-CM | POA: Diagnosis not present

## 2018-03-19 DIAGNOSIS — N2581 Secondary hyperparathyroidism of renal origin: Secondary | ICD-10-CM | POA: Diagnosis not present

## 2018-03-19 DIAGNOSIS — N186 End stage renal disease: Secondary | ICD-10-CM | POA: Diagnosis not present

## 2018-03-21 DIAGNOSIS — N186 End stage renal disease: Secondary | ICD-10-CM | POA: Diagnosis not present

## 2018-03-21 DIAGNOSIS — N2581 Secondary hyperparathyroidism of renal origin: Secondary | ICD-10-CM | POA: Diagnosis not present

## 2018-03-23 DIAGNOSIS — N2581 Secondary hyperparathyroidism of renal origin: Secondary | ICD-10-CM | POA: Diagnosis not present

## 2018-03-23 DIAGNOSIS — N186 End stage renal disease: Secondary | ICD-10-CM | POA: Diagnosis not present

## 2018-03-26 DIAGNOSIS — N2581 Secondary hyperparathyroidism of renal origin: Secondary | ICD-10-CM | POA: Diagnosis not present

## 2018-03-26 DIAGNOSIS — N186 End stage renal disease: Secondary | ICD-10-CM | POA: Diagnosis not present

## 2018-03-28 DIAGNOSIS — N2581 Secondary hyperparathyroidism of renal origin: Secondary | ICD-10-CM | POA: Diagnosis not present

## 2018-03-28 DIAGNOSIS — N186 End stage renal disease: Secondary | ICD-10-CM | POA: Diagnosis not present

## 2018-03-30 ENCOUNTER — Encounter (HOSPITAL_COMMUNITY): Payer: Self-pay

## 2018-03-30 ENCOUNTER — Emergency Department (HOSPITAL_COMMUNITY)
Admission: EM | Admit: 2018-03-30 | Discharge: 2018-03-30 | Disposition: A | Discharge status: Home / Self Care | Payer: Medicare HMO | Attending: Emergency Medicine | Admitting: Emergency Medicine

## 2018-03-30 ENCOUNTER — Other Ambulatory Visit: Payer: Self-pay

## 2018-03-30 ENCOUNTER — Emergency Department (HOSPITAL_COMMUNITY): Payer: Medicare HMO

## 2018-03-30 DIAGNOSIS — Z5321 Procedure and treatment not carried out due to patient leaving prior to being seen by health care provider: Secondary | ICD-10-CM | POA: Diagnosis not present

## 2018-03-30 DIAGNOSIS — R002 Palpitations: Secondary | ICD-10-CM | POA: Diagnosis not present

## 2018-03-30 DIAGNOSIS — E1122 Type 2 diabetes mellitus with diabetic chronic kidney disease: Secondary | ICD-10-CM | POA: Diagnosis not present

## 2018-03-30 DIAGNOSIS — N2581 Secondary hyperparathyroidism of renal origin: Secondary | ICD-10-CM | POA: Diagnosis not present

## 2018-03-30 DIAGNOSIS — N186 End stage renal disease: Secondary | ICD-10-CM | POA: Diagnosis not present

## 2018-03-30 DIAGNOSIS — Z992 Dependence on renal dialysis: Secondary | ICD-10-CM | POA: Diagnosis not present

## 2018-03-30 DIAGNOSIS — N179 Acute kidney failure, unspecified: Secondary | ICD-10-CM | POA: Diagnosis not present

## 2018-03-30 LAB — BASIC METABOLIC PANEL
Anion gap: 11 (ref 5–15)
BUN: 14 mg/dL (ref 6–20)
CALCIUM: 8.7 mg/dL — AB (ref 8.9–10.3)
CHLORIDE: 95 mmol/L — AB (ref 101–111)
CO2: 32 mmol/L (ref 22–32)
CREATININE: 3.3 mg/dL — AB (ref 0.44–1.00)
GFR, EST AFRICAN AMERICAN: 15 mL/min — AB (ref >60)
GFR, EST NON AFRICAN AMERICAN: 13 mL/min — AB (ref >60)
GLUCOSE: 164 mg/dL — AB (ref 65–99)
Potassium: 3 mmol/L — ABNORMAL LOW (ref 3.5–5.1)
Sodium: 138 mmol/L (ref 135–145)

## 2018-03-30 LAB — CBC
HCT: 37.7 % (ref 36.0–46.0)
HEMOGLOBIN: 11.9 g/dL — AB (ref 12.0–15.0)
MCH: 31.3 pg (ref 26.0–34.0)
MCHC: 31.6 g/dL (ref 30.0–36.0)
MCV: 99.2 fL (ref 78.0–100.0)
Platelets: 91 10*3/uL — ABNORMAL LOW (ref 150–400)
RBC: 3.8 MIL/uL — ABNORMAL LOW (ref 3.87–5.11)
RDW: 15.2 % (ref 11.5–15.5)
WBC: 3 10*3/uL — AB (ref 4.0–10.5)

## 2018-03-30 LAB — I-STAT TROPONIN, ED: TROPONIN I, POC: 0.05 ng/mL (ref 0.00–0.08)

## 2018-03-30 NOTE — ED Notes (Signed)
Pt called x3 for vitals, no response  

## 2018-03-30 NOTE — ED Triage Notes (Signed)
Patient from dialysis after having an hour of palpitations while on machine. Denies CP, no SOB, alert and oriented, reports that she feels fine now and no further episodes. Alert and oriented

## 2018-04-02 DIAGNOSIS — N2581 Secondary hyperparathyroidism of renal origin: Secondary | ICD-10-CM | POA: Diagnosis not present

## 2018-04-02 DIAGNOSIS — N186 End stage renal disease: Secondary | ICD-10-CM | POA: Diagnosis not present

## 2018-04-04 DIAGNOSIS — N2581 Secondary hyperparathyroidism of renal origin: Secondary | ICD-10-CM | POA: Diagnosis not present

## 2018-04-04 DIAGNOSIS — N186 End stage renal disease: Secondary | ICD-10-CM | POA: Diagnosis not present

## 2018-04-06 DIAGNOSIS — N2581 Secondary hyperparathyroidism of renal origin: Secondary | ICD-10-CM | POA: Diagnosis not present

## 2018-04-06 DIAGNOSIS — N186 End stage renal disease: Secondary | ICD-10-CM | POA: Diagnosis not present

## 2018-04-09 DIAGNOSIS — N186 End stage renal disease: Secondary | ICD-10-CM | POA: Diagnosis not present

## 2018-04-09 DIAGNOSIS — N2581 Secondary hyperparathyroidism of renal origin: Secondary | ICD-10-CM | POA: Diagnosis not present

## 2018-04-11 DIAGNOSIS — N2581 Secondary hyperparathyroidism of renal origin: Secondary | ICD-10-CM | POA: Diagnosis not present

## 2018-04-11 DIAGNOSIS — N186 End stage renal disease: Secondary | ICD-10-CM | POA: Diagnosis not present

## 2018-04-13 DIAGNOSIS — N2581 Secondary hyperparathyroidism of renal origin: Secondary | ICD-10-CM | POA: Diagnosis not present

## 2018-04-13 DIAGNOSIS — N186 End stage renal disease: Secondary | ICD-10-CM | POA: Diagnosis not present

## 2018-04-16 DIAGNOSIS — N186 End stage renal disease: Secondary | ICD-10-CM | POA: Diagnosis not present

## 2018-04-16 DIAGNOSIS — N2581 Secondary hyperparathyroidism of renal origin: Secondary | ICD-10-CM | POA: Diagnosis not present

## 2018-04-18 DIAGNOSIS — N2581 Secondary hyperparathyroidism of renal origin: Secondary | ICD-10-CM | POA: Diagnosis not present

## 2018-04-18 DIAGNOSIS — N186 End stage renal disease: Secondary | ICD-10-CM | POA: Diagnosis not present

## 2018-04-20 DIAGNOSIS — N186 End stage renal disease: Secondary | ICD-10-CM | POA: Diagnosis not present

## 2018-04-20 DIAGNOSIS — N2581 Secondary hyperparathyroidism of renal origin: Secondary | ICD-10-CM | POA: Diagnosis not present

## 2018-04-23 DIAGNOSIS — N2581 Secondary hyperparathyroidism of renal origin: Secondary | ICD-10-CM | POA: Diagnosis not present

## 2018-04-23 DIAGNOSIS — N186 End stage renal disease: Secondary | ICD-10-CM | POA: Diagnosis not present

## 2018-04-25 DIAGNOSIS — N186 End stage renal disease: Secondary | ICD-10-CM | POA: Diagnosis not present

## 2018-04-25 DIAGNOSIS — N2581 Secondary hyperparathyroidism of renal origin: Secondary | ICD-10-CM | POA: Diagnosis not present

## 2018-04-27 DIAGNOSIS — N186 End stage renal disease: Secondary | ICD-10-CM | POA: Diagnosis not present

## 2018-04-27 DIAGNOSIS — N2581 Secondary hyperparathyroidism of renal origin: Secondary | ICD-10-CM | POA: Diagnosis not present

## 2018-04-29 DIAGNOSIS — N186 End stage renal disease: Secondary | ICD-10-CM | POA: Diagnosis not present

## 2018-04-29 DIAGNOSIS — E1122 Type 2 diabetes mellitus with diabetic chronic kidney disease: Secondary | ICD-10-CM | POA: Diagnosis not present

## 2018-04-29 DIAGNOSIS — Z992 Dependence on renal dialysis: Secondary | ICD-10-CM | POA: Diagnosis not present

## 2018-04-30 DIAGNOSIS — N2581 Secondary hyperparathyroidism of renal origin: Secondary | ICD-10-CM | POA: Diagnosis not present

## 2018-04-30 DIAGNOSIS — N186 End stage renal disease: Secondary | ICD-10-CM | POA: Diagnosis not present

## 2018-05-02 DIAGNOSIS — N186 End stage renal disease: Secondary | ICD-10-CM | POA: Diagnosis not present

## 2018-05-02 DIAGNOSIS — N2581 Secondary hyperparathyroidism of renal origin: Secondary | ICD-10-CM | POA: Diagnosis not present

## 2018-05-04 DIAGNOSIS — N186 End stage renal disease: Secondary | ICD-10-CM | POA: Diagnosis not present

## 2018-05-04 DIAGNOSIS — N2581 Secondary hyperparathyroidism of renal origin: Secondary | ICD-10-CM | POA: Diagnosis not present

## 2018-05-07 ENCOUNTER — Encounter: Payer: Self-pay | Admitting: Physician Assistant

## 2018-05-07 DIAGNOSIS — N2581 Secondary hyperparathyroidism of renal origin: Secondary | ICD-10-CM | POA: Diagnosis not present

## 2018-05-07 DIAGNOSIS — N186 End stage renal disease: Secondary | ICD-10-CM | POA: Diagnosis not present

## 2018-05-10 DIAGNOSIS — N2581 Secondary hyperparathyroidism of renal origin: Secondary | ICD-10-CM | POA: Diagnosis not present

## 2018-05-10 DIAGNOSIS — N186 End stage renal disease: Secondary | ICD-10-CM | POA: Diagnosis not present

## 2018-05-12 DIAGNOSIS — N186 End stage renal disease: Secondary | ICD-10-CM | POA: Diagnosis not present

## 2018-05-12 DIAGNOSIS — N2581 Secondary hyperparathyroidism of renal origin: Secondary | ICD-10-CM | POA: Diagnosis not present

## 2018-05-15 DIAGNOSIS — N2581 Secondary hyperparathyroidism of renal origin: Secondary | ICD-10-CM | POA: Diagnosis not present

## 2018-05-15 DIAGNOSIS — N186 End stage renal disease: Secondary | ICD-10-CM | POA: Diagnosis not present

## 2018-05-17 DIAGNOSIS — N186 End stage renal disease: Secondary | ICD-10-CM | POA: Diagnosis not present

## 2018-05-17 DIAGNOSIS — N2581 Secondary hyperparathyroidism of renal origin: Secondary | ICD-10-CM | POA: Diagnosis not present

## 2018-05-19 DIAGNOSIS — N186 End stage renal disease: Secondary | ICD-10-CM | POA: Diagnosis not present

## 2018-05-19 DIAGNOSIS — N2581 Secondary hyperparathyroidism of renal origin: Secondary | ICD-10-CM | POA: Diagnosis not present

## 2018-05-22 ENCOUNTER — Ambulatory Visit: Payer: Medicare HMO | Admitting: Physician Assistant

## 2018-05-22 DIAGNOSIS — N186 End stage renal disease: Secondary | ICD-10-CM | POA: Diagnosis not present

## 2018-05-22 DIAGNOSIS — N2581 Secondary hyperparathyroidism of renal origin: Secondary | ICD-10-CM | POA: Diagnosis not present

## 2018-05-24 DIAGNOSIS — N186 End stage renal disease: Secondary | ICD-10-CM | POA: Diagnosis not present

## 2018-05-24 DIAGNOSIS — N2581 Secondary hyperparathyroidism of renal origin: Secondary | ICD-10-CM | POA: Diagnosis not present

## 2018-05-26 DIAGNOSIS — N186 End stage renal disease: Secondary | ICD-10-CM | POA: Diagnosis not present

## 2018-05-26 DIAGNOSIS — N2581 Secondary hyperparathyroidism of renal origin: Secondary | ICD-10-CM | POA: Diagnosis not present

## 2018-05-28 DIAGNOSIS — N2581 Secondary hyperparathyroidism of renal origin: Secondary | ICD-10-CM | POA: Diagnosis not present

## 2018-05-28 DIAGNOSIS — N186 End stage renal disease: Secondary | ICD-10-CM | POA: Diagnosis not present

## 2018-05-29 ENCOUNTER — Other Ambulatory Visit: Payer: Self-pay | Admitting: Family Medicine

## 2018-05-29 DIAGNOSIS — N184 Chronic kidney disease, stage 4 (severe): Secondary | ICD-10-CM

## 2018-05-30 DIAGNOSIS — Z992 Dependence on renal dialysis: Secondary | ICD-10-CM | POA: Diagnosis not present

## 2018-05-30 DIAGNOSIS — N2581 Secondary hyperparathyroidism of renal origin: Secondary | ICD-10-CM | POA: Diagnosis not present

## 2018-05-30 DIAGNOSIS — E1122 Type 2 diabetes mellitus with diabetic chronic kidney disease: Secondary | ICD-10-CM | POA: Diagnosis not present

## 2018-05-30 DIAGNOSIS — N186 End stage renal disease: Secondary | ICD-10-CM | POA: Diagnosis not present

## 2018-05-31 ENCOUNTER — Ambulatory Visit: Payer: Medicare HMO | Admitting: Family Medicine

## 2018-05-31 DIAGNOSIS — Z0289 Encounter for other administrative examinations: Secondary | ICD-10-CM

## 2018-06-01 DIAGNOSIS — N2581 Secondary hyperparathyroidism of renal origin: Secondary | ICD-10-CM | POA: Diagnosis not present

## 2018-06-01 DIAGNOSIS — N186 End stage renal disease: Secondary | ICD-10-CM | POA: Diagnosis not present

## 2018-06-04 DIAGNOSIS — N2581 Secondary hyperparathyroidism of renal origin: Secondary | ICD-10-CM | POA: Diagnosis not present

## 2018-06-04 DIAGNOSIS — N186 End stage renal disease: Secondary | ICD-10-CM | POA: Diagnosis not present

## 2018-06-06 DIAGNOSIS — N2581 Secondary hyperparathyroidism of renal origin: Secondary | ICD-10-CM | POA: Diagnosis not present

## 2018-06-06 DIAGNOSIS — N186 End stage renal disease: Secondary | ICD-10-CM | POA: Diagnosis not present

## 2018-06-08 DIAGNOSIS — N2581 Secondary hyperparathyroidism of renal origin: Secondary | ICD-10-CM | POA: Diagnosis not present

## 2018-06-08 DIAGNOSIS — N186 End stage renal disease: Secondary | ICD-10-CM | POA: Diagnosis not present

## 2018-06-11 DIAGNOSIS — N2581 Secondary hyperparathyroidism of renal origin: Secondary | ICD-10-CM | POA: Diagnosis not present

## 2018-06-11 DIAGNOSIS — N186 End stage renal disease: Secondary | ICD-10-CM | POA: Diagnosis not present

## 2018-06-13 DIAGNOSIS — N2581 Secondary hyperparathyroidism of renal origin: Secondary | ICD-10-CM | POA: Diagnosis not present

## 2018-06-13 DIAGNOSIS — N186 End stage renal disease: Secondary | ICD-10-CM | POA: Diagnosis not present

## 2018-06-15 DIAGNOSIS — N2581 Secondary hyperparathyroidism of renal origin: Secondary | ICD-10-CM | POA: Diagnosis not present

## 2018-06-15 DIAGNOSIS — N186 End stage renal disease: Secondary | ICD-10-CM | POA: Diagnosis not present

## 2018-06-18 ENCOUNTER — Encounter: Payer: Self-pay | Admitting: Family Medicine

## 2018-06-18 DIAGNOSIS — N186 End stage renal disease: Secondary | ICD-10-CM | POA: Diagnosis not present

## 2018-06-18 DIAGNOSIS — N2581 Secondary hyperparathyroidism of renal origin: Secondary | ICD-10-CM | POA: Diagnosis not present

## 2018-06-22 DIAGNOSIS — N2581 Secondary hyperparathyroidism of renal origin: Secondary | ICD-10-CM | POA: Diagnosis not present

## 2018-06-22 DIAGNOSIS — N186 End stage renal disease: Secondary | ICD-10-CM | POA: Diagnosis not present

## 2018-06-25 DIAGNOSIS — N186 End stage renal disease: Secondary | ICD-10-CM | POA: Diagnosis not present

## 2018-06-27 DIAGNOSIS — N186 End stage renal disease: Secondary | ICD-10-CM | POA: Diagnosis not present

## 2018-06-29 DIAGNOSIS — N2581 Secondary hyperparathyroidism of renal origin: Secondary | ICD-10-CM | POA: Diagnosis not present

## 2018-06-29 DIAGNOSIS — N186 End stage renal disease: Secondary | ICD-10-CM | POA: Diagnosis not present

## 2018-06-30 DIAGNOSIS — Z992 Dependence on renal dialysis: Secondary | ICD-10-CM | POA: Diagnosis not present

## 2018-06-30 DIAGNOSIS — N186 End stage renal disease: Secondary | ICD-10-CM | POA: Diagnosis not present

## 2018-06-30 DIAGNOSIS — E1122 Type 2 diabetes mellitus with diabetic chronic kidney disease: Secondary | ICD-10-CM | POA: Diagnosis not present

## 2018-07-02 DIAGNOSIS — N2581 Secondary hyperparathyroidism of renal origin: Secondary | ICD-10-CM | POA: Diagnosis not present

## 2018-07-02 DIAGNOSIS — N186 End stage renal disease: Secondary | ICD-10-CM | POA: Diagnosis not present

## 2018-07-04 ENCOUNTER — Encounter: Payer: Self-pay | Admitting: Physician Assistant

## 2018-07-04 ENCOUNTER — Ambulatory Visit: Payer: Medicare HMO | Admitting: Physician Assistant

## 2018-07-04 VITALS — BP 130/80 | HR 61 | Ht 65.0 in | Wt 133.8 lb

## 2018-07-04 DIAGNOSIS — N186 End stage renal disease: Secondary | ICD-10-CM

## 2018-07-04 DIAGNOSIS — I214 Non-ST elevation (NSTEMI) myocardial infarction: Secondary | ICD-10-CM

## 2018-07-04 DIAGNOSIS — R002 Palpitations: Secondary | ICD-10-CM

## 2018-07-04 DIAGNOSIS — I700 Atherosclerosis of aorta: Secondary | ICD-10-CM

## 2018-07-04 DIAGNOSIS — Z992 Dependence on renal dialysis: Secondary | ICD-10-CM | POA: Diagnosis not present

## 2018-07-04 DIAGNOSIS — I5032 Chronic diastolic (congestive) heart failure: Secondary | ICD-10-CM | POA: Diagnosis not present

## 2018-07-04 DIAGNOSIS — I10 Essential (primary) hypertension: Secondary | ICD-10-CM | POA: Diagnosis not present

## 2018-07-04 DIAGNOSIS — N2581 Secondary hyperparathyroidism of renal origin: Secondary | ICD-10-CM | POA: Diagnosis not present

## 2018-07-04 NOTE — Patient Instructions (Addendum)
Medication Instructions:  Your physician recommends that you continue on your current medications as directed. Please refer to the Current Medication list given to you today.  Labwork: None ordered  Testing/Procedures: None ordered  Follow-Up: Your physician wants you to follow-up in: [place] DR. Johnsie Cancel   You will receive a reminder letter in the mail two months in advance. If you don't receive a letter, please call our office to schedule the follow-up appointment.    Any Other Special Instructions Will Be Listed Below (If Applicable).     If you need a refill on your cardiac medications before your next appointment, please call your pharmacy.

## 2018-07-04 NOTE — Progress Notes (Signed)
Cardiology Office Note    Date:  07/04/2018   ID:  [name] [name], DOB 03-May-1941, MRN [id number]  PCP:  Ann Held, DO  Cardiologist:  Dr. Johnsie Cancel   Chief Complaint: follow up for palpitations   History of Present Illness:   [name] [name] is a 77 y.o. female with a hx of DM, HTN, HLD, ESRD on HD (MWF), chronic diastolic CHF and aortic stenosis presents for follow up.   She was admitted 11/25/16-11/27/16 for acute respiratory failure with hypoxia in setting of missed dialysis and malignant hypertension. Required IV nitro. BP improved with HD. Echo 11/27/16 showed LV function of 55-60%, grade 2 DD, mild to moderate AS, mild dilated LA, mild reduced RV function and PA pressure of 31 mm HG. Refereed for further evaluation.  The patient was seen by me 01/17/17 to establish cardiac care and syncope during dialysis. She was euvolemic. Recommended stress test given cardiac risk factors of HTN, ESRD and DM. however never followed up.   Seen 10/2017 when admitted for SOB and chest pain. EKG with inferior lateral ST depression. Low risk stress test. Echo showed normal LVEF at 55-60%. Sclerosis aortic valve without stenosis. No further work up.   She had palpitations during dialysis 03/30/18 lasting for few hours.  She was sent to ER for further evaluation.  EKG in system shows sinus rhythm at rate of 89 with PAC.  No tachycardia noted.  She left home from triage without seen by provider.  No recurrent palpitation.  She is here for follow-up.  She had no syncope during dialysis.  Denies chest pain, shortness of breath, orthopnea, PND, lower extremity edema or melena.  Compliant with medication.  Limited activity.   Past Medical History:  Diagnosis Date  . DEPRESSION   . DIABETES MELLITUS, TYPE I   . GERD   . GLAUCOMA   . Headache(784.0)   . Hepatitis B carrier ([place])    05/2009: neg Hep C; Hep B: core pos, Surf neg; fatty liver US 8/10 - 7/13  . HYPERLIPIDEMIA   . HYPERTENSION   .  Kidney failure    dialysis 3 times per week  . OSTEOPENIA   . POSTMENOPAUSAL STATUS   . Pulmonary edema   . Unspecified vitamin D deficiency     Past Surgical History:  Procedure Laterality Date  . AV FISTULA PLACEMENT Left 05/29/2014   Procedure: INSERTION OF ARTERIOVENOUS (AV) GORE-TEX GRAFT ARM-LEFT;  Surgeon: Angelia Mould, MD;  Location: [facility];  Service: Vascular;  Laterality: Left;  . CHOLECYSTECTOMY  02/12/07  . INSERTION OF DIALYSIS CATHETER Right 05/27/2014   Procedure: INSERTION OF DIALYSIS CATHETER;  Surgeon: Angelia Mould, MD;  Location: [facility];  Service: Vascular;  Laterality: Right;  . REFRACTIVE SURGERY  07/29/09   Dr. Bing Plume  . TONSILLECTOMY      Current Medications: Prior to Admission medications   Medication Sig Start Date End Date Taking? Authorizing Provider  calcitRIOL (ROCALTROL) 0.5 MCG capsule Take 3 capsules (1.5 mcg total) by mouth 3 (three) times a week. 11/13/17   Jani Gravel, MD  ferrous sulfate 325 (65 FE) MG tablet Take 325-650 mg by mouth daily with breakfast.    [provider]  isosorbide mononitrate (IMDUR) 30 MG 24 hr tablet TAKE 1 TABLET BY MOUTH TWICE A DAY 05/29/18   Ann Held, DO  Multiple Vitamin (MULTIVITAMIN WITH MINERALS) TABS tablet Take 1 tablet by mouth daily.     [provider]  nitroGLYCERIN (NITROSTAT) 0.4 MG SL tablet Place 1 tablet (0.4 mg total) under the tongue every 5 (five) minutes x 3 doses as needed for chest pain. 01/29/18   Mendel Corning, MD    Allergies:   Hydralazine and Robaxin [methocarbamol]   Social History   Socioeconomic History  . Marital status: Married    Spouse name: Not on file  . Number of children: Not on file  . Years of education: Not on file  . Highest education level: Not on file  Occupational History  . Not on file  Social Needs  . Financial resource strain: Not on file  . Food insecurity:    Worry: Not on file    Inability: Not on file  . Transportation  needs:    Medical: Not on file    Non-medical: Not on file  Tobacco Use  . Smoking status: Never Smoker  . Smokeless tobacco: Never Used  . 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 Retired Consulting civil engineer  Substance and Sexual Activity  . Alcohol use: No  . Drug use: No  . Sexual activity: Not Currently    Birth control/protection: None  Lifestyle  . Physical activity:    Days per week: Not on file    Minutes per session: Not on file  . Stress: Not on file  Relationships  . Social connections:    Talks on phone: Not on file    Gets together: Not on file    Attends religious service: Not on file    Active member of club or organization: Not on file    Attends meetings of clubs or organizations: Not on file    Relationship status: Not on file  Other Topics Concern  . Not on file  Social History Narrative  . Not on file     Family History:  The patient's family history includes Arthritis in her other; Coronary artery disease in her other; Diabetes in her other; Hypertension in her other; Kidney disease in her mother and sister.   ROS:   Please see the history of present illness.    ROS All other systems reviewed and are negative.   PHYSICAL EXAM:   VS:  BP 130/80   Pulse 61   Ht 5\' 5"  (1.651 m)   Wt 133 lb 12.8 oz (60.7 kg)   BMI 22.27 kg/m    GEN: Thin frail female in no acute distress  HEENT: normal  Neck: no JVD, carotid bruits, or masses Cardiac: RRR; 2/3 systolic murmurs, rubs, or gallops,no edema  Respiratory:  clear to auscultation bilaterally, normal work of breathing GI: soft, nontender, nondistended, + BS MS: no deformity or atrophy  Skin: warm and dry, no rash Neuro:  Alert and Oriented x 3, Strength and sensation are intact Psych: euthymic mood, full affect  Wt Readings from Last 3 Encounters:  07/04/18 133 lb 12.8 oz (60.7 kg)  03/01/18 136 lb 12.8 oz (62.1 kg)  01/29/18 134 lb 7.7 oz (61 kg)      Studies/Labs Reviewed:    EKG:  EKG is ordered today.  The ekg ordered today demonstrates sinus rhythm with chronic T wave inversion inferior laterally  Recent Labs: 11/07/2017: B Natriuretic Peptide 1,397.8 11/15/2017: ALT 34; TSH 0.536 01/28/2018: Magnesium 1.9 03/30/2018: BUN 14; Creatinine, Ser 3.30; Hemoglobin 11.9; Platelets 91; Potassium 3.0; Sodium 138   Lipid Panel    Component Value Date/Time   CHOL 203 (H) 03/01/2018 1216   TRIG 110.0 03/01/2018 1216  HDL 63.30 03/01/2018 1216   CHOLHDL 3 03/01/2018 1216   VLDL 22.0 03/01/2018 1216   LDLCALC 118 (H) 03/01/2018 1216   LDLDIRECT 196.2 04/26/2013 1152    Additional studies/ records that were reviewed today include:   Echocardiogram: 10/2017 Study Conclusions  - Left ventricle: The cavity size was mildly dilated. Wall   thickness was increased in a pattern of moderate LVH. Systolic   function was normal. The estimated ejection fraction was in the   range of 55% to 60%. - Mitral valve: Calcified annulus. Mildly thickened leaflets . - Left atrium: The atrium was moderately dilated. - Atrial septum: No defect or patent foramen ovale was identified.    Stress test 10/2017 IMPRESSION: 1. No reversible ischemia or infarction.  2. Normal left ventricular wall motion.  3. Left ventricular ejection fraction 53%  4. Non invasive risk stratification*: Low    ASSESSMENT & PLAN:    1. Chronic diastolic CHF -Euvolemic.  She makes little urine in volume mostly managed by dialysis.  2. AS - Felt to be sclerotic but not stenotic by last echo 10/2017. Follow clinically.  Noted 2/6 systolic murmur.  May consider repeat echo during follow-up.  3. ESRD on HD -Compliant  4.  History of syncope during dialysis -Felt likely due to orthostasis/hypotension.  No recurrent episodes since discontinuation of hypertensive medication.  5.  Palpitation -Lasting for few hours during dialysis 03/30/2018.  She left home from triage without seen by provider.   No tachyarrhythmia noted by review of EKG.  No recurrence.  She is not interested in further evaluation currently.  She will let us know if recurrent episodes.    Medication Adjustments/Labs and Tests Ordered: Current medicines are reviewed at length with the patient today.  Concerns regarding medicines are outlined above.  Medication changes, Labs and Tests ordered today are listed in the Patient Instructions below. Patient Instructions  Medication Instructions:  Your physician recommends that you continue on your current medications as directed. Please refer to the Current Medication list given to you today.  Labwork: None ordered  Testing/Procedures: None ordered  Follow-Up: Your physician wants you to follow-up in: [place] DR. Johnsie Cancel   You will receive a reminder letter in the mail two months in advance. If you don't receive a letter, please call our office to schedule the follow-up appointment.    Any Other Special Instructions Will Be Listed Below (If Applicable).     If you need a refill on your cardiac medications before your next appointment, please call your pharmacy.      Jarrett Soho, [place]  07/04/2018 9:25 AM    [address], [place], [place]  [address] Phone: [phone]; Fax: [phone]

## 2018-07-06 DIAGNOSIS — N2581 Secondary hyperparathyroidism of renal origin: Secondary | ICD-10-CM | POA: Diagnosis not present

## 2018-07-06 DIAGNOSIS — N186 End stage renal disease: Secondary | ICD-10-CM | POA: Diagnosis not present

## 2018-07-09 DIAGNOSIS — N186 End stage renal disease: Secondary | ICD-10-CM | POA: Diagnosis not present

## 2018-07-09 DIAGNOSIS — N2581 Secondary hyperparathyroidism of renal origin: Secondary | ICD-10-CM | POA: Diagnosis not present

## 2018-07-11 DIAGNOSIS — N2581 Secondary hyperparathyroidism of renal origin: Secondary | ICD-10-CM | POA: Diagnosis not present

## 2018-07-11 DIAGNOSIS — N186 End stage renal disease: Secondary | ICD-10-CM | POA: Diagnosis not present

## 2018-07-13 DIAGNOSIS — N186 End stage renal disease: Secondary | ICD-10-CM | POA: Diagnosis not present

## 2018-07-13 DIAGNOSIS — N2581 Secondary hyperparathyroidism of renal origin: Secondary | ICD-10-CM | POA: Diagnosis not present

## 2018-07-16 DIAGNOSIS — N2581 Secondary hyperparathyroidism of renal origin: Secondary | ICD-10-CM | POA: Diagnosis not present

## 2018-07-16 DIAGNOSIS — N186 End stage renal disease: Secondary | ICD-10-CM | POA: Diagnosis not present

## 2018-07-18 DIAGNOSIS — N186 End stage renal disease: Secondary | ICD-10-CM | POA: Diagnosis not present

## 2018-07-18 DIAGNOSIS — N2581 Secondary hyperparathyroidism of renal origin: Secondary | ICD-10-CM | POA: Diagnosis not present

## 2018-07-20 DIAGNOSIS — N186 End stage renal disease: Secondary | ICD-10-CM | POA: Diagnosis not present

## 2018-07-20 DIAGNOSIS — N2581 Secondary hyperparathyroidism of renal origin: Secondary | ICD-10-CM | POA: Diagnosis not present

## 2018-07-23 DIAGNOSIS — N2581 Secondary hyperparathyroidism of renal origin: Secondary | ICD-10-CM | POA: Diagnosis not present

## 2018-07-23 DIAGNOSIS — N186 End stage renal disease: Secondary | ICD-10-CM | POA: Diagnosis not present

## 2018-07-25 DIAGNOSIS — N2581 Secondary hyperparathyroidism of renal origin: Secondary | ICD-10-CM | POA: Diagnosis not present

## 2018-07-25 DIAGNOSIS — N186 End stage renal disease: Secondary | ICD-10-CM | POA: Diagnosis not present

## 2018-07-27 DIAGNOSIS — N186 End stage renal disease: Secondary | ICD-10-CM | POA: Diagnosis not present

## 2018-07-27 DIAGNOSIS — N2581 Secondary hyperparathyroidism of renal origin: Secondary | ICD-10-CM | POA: Diagnosis not present

## 2018-07-30 DIAGNOSIS — Z992 Dependence on renal dialysis: Secondary | ICD-10-CM | POA: Diagnosis not present

## 2018-07-30 DIAGNOSIS — N186 End stage renal disease: Secondary | ICD-10-CM | POA: Diagnosis not present

## 2018-07-30 DIAGNOSIS — E1122 Type 2 diabetes mellitus with diabetic chronic kidney disease: Secondary | ICD-10-CM | POA: Diagnosis not present

## 2018-07-30 DIAGNOSIS — N2581 Secondary hyperparathyroidism of renal origin: Secondary | ICD-10-CM | POA: Diagnosis not present

## 2018-08-01 DIAGNOSIS — N2581 Secondary hyperparathyroidism of renal origin: Secondary | ICD-10-CM | POA: Diagnosis not present

## 2018-08-01 DIAGNOSIS — N186 End stage renal disease: Secondary | ICD-10-CM | POA: Diagnosis not present

## 2018-08-03 DIAGNOSIS — N2581 Secondary hyperparathyroidism of renal origin: Secondary | ICD-10-CM | POA: Diagnosis not present

## 2018-08-03 DIAGNOSIS — N186 End stage renal disease: Secondary | ICD-10-CM | POA: Diagnosis not present

## 2018-08-06 DIAGNOSIS — N2581 Secondary hyperparathyroidism of renal origin: Secondary | ICD-10-CM | POA: Diagnosis not present

## 2018-08-06 DIAGNOSIS — N186 End stage renal disease: Secondary | ICD-10-CM | POA: Diagnosis not present

## 2018-08-08 DIAGNOSIS — N2581 Secondary hyperparathyroidism of renal origin: Secondary | ICD-10-CM | POA: Diagnosis not present

## 2018-08-08 DIAGNOSIS — N186 End stage renal disease: Secondary | ICD-10-CM | POA: Diagnosis not present

## 2018-08-11 DIAGNOSIS — N2581 Secondary hyperparathyroidism of renal origin: Secondary | ICD-10-CM | POA: Diagnosis not present

## 2018-08-11 DIAGNOSIS — N186 End stage renal disease: Secondary | ICD-10-CM | POA: Diagnosis not present

## 2018-08-13 DIAGNOSIS — N186 End stage renal disease: Secondary | ICD-10-CM | POA: Diagnosis not present

## 2018-08-13 DIAGNOSIS — N2581 Secondary hyperparathyroidism of renal origin: Secondary | ICD-10-CM | POA: Diagnosis not present

## 2018-08-15 DIAGNOSIS — N2581 Secondary hyperparathyroidism of renal origin: Secondary | ICD-10-CM | POA: Diagnosis not present

## 2018-08-15 DIAGNOSIS — N186 End stage renal disease: Secondary | ICD-10-CM | POA: Diagnosis not present

## 2018-08-17 DIAGNOSIS — N186 End stage renal disease: Secondary | ICD-10-CM | POA: Diagnosis not present

## 2018-08-17 DIAGNOSIS — N2581 Secondary hyperparathyroidism of renal origin: Secondary | ICD-10-CM | POA: Diagnosis not present

## 2018-08-20 DIAGNOSIS — N186 End stage renal disease: Secondary | ICD-10-CM | POA: Diagnosis not present

## 2018-08-20 DIAGNOSIS — N2581 Secondary hyperparathyroidism of renal origin: Secondary | ICD-10-CM | POA: Diagnosis not present

## 2018-08-21 ENCOUNTER — Ambulatory Visit (INDEPENDENT_AMBULATORY_CARE_PROVIDER_SITE_OTHER): Payer: Medicare HMO | Admitting: Family Medicine

## 2018-08-21 ENCOUNTER — Telehealth: Payer: Self-pay | Admitting: *Deleted

## 2018-08-21 ENCOUNTER — Encounter: Payer: Self-pay | Admitting: *Deleted

## 2018-08-21 ENCOUNTER — Encounter: Payer: Self-pay | Admitting: Family Medicine

## 2018-08-21 VITALS — BP 122/70 | HR 79 | Temp 98.5°F | Resp 16 | Ht 65.0 in | Wt 123.4 lb

## 2018-08-21 DIAGNOSIS — N390 Urinary tract infection, site not specified: Secondary | ICD-10-CM | POA: Diagnosis not present

## 2018-08-21 DIAGNOSIS — R443 Hallucinations, unspecified: Secondary | ICD-10-CM | POA: Diagnosis not present

## 2018-08-21 DIAGNOSIS — R413 Other amnesia: Secondary | ICD-10-CM | POA: Diagnosis not present

## 2018-08-21 LAB — CBC WITH DIFFERENTIAL/PLATELET
BASOS ABS: 0 10*3/uL (ref 0.0–0.1)
Basophils Relative: 1.2 % (ref 0.0–3.0)
EOS PCT: 4.1 % (ref 0.0–5.0)
Eosinophils Absolute: 0.1 10*3/uL (ref 0.0–0.7)
HCT: 35.8 % — ABNORMAL LOW (ref 36.0–46.0)
Hemoglobin: 11.4 g/dL — ABNORMAL LOW (ref 12.0–15.0)
LYMPHS ABS: 0.4 10*3/uL — AB (ref 0.7–4.0)
Lymphocytes Relative: 16.2 % (ref 12.0–46.0)
MCHC: 31.7 g/dL (ref 30.0–36.0)
MCV: 93.6 fl (ref 78.0–100.0)
MONOS PCT: 10 % (ref 3.0–12.0)
Monocytes Absolute: 0.3 10*3/uL (ref 0.1–1.0)
NEUTROS ABS: 1.8 10*3/uL (ref 1.4–7.7)
Neutrophils Relative %: 68.5 % (ref 43.0–77.0)
PLATELETS: 80 10*3/uL — AB (ref 150.0–400.0)
RBC: 3.83 Mil/uL — ABNORMAL LOW (ref 3.87–5.11)
RDW: 22.4 % — ABNORMAL HIGH (ref 11.5–15.5)
WBC: 2.7 10*3/uL — ABNORMAL LOW (ref 4.0–10.5)

## 2018-08-21 LAB — POC URINALSYSI DIPSTICK (AUTOMATED)
BILIRUBIN UA: NEGATIVE
GLUCOSE UA: NEGATIVE
KETONES UA: NEGATIVE
NITRITE UA: NEGATIVE
PH UA: 6 (ref 5.0–8.0)
Protein, UA: POSITIVE — AB
Spec Grav, UA: 1.025 (ref 1.010–1.025)
Urobilinogen, UA: 0.2 E.U./dL

## 2018-08-21 LAB — LIPID PANEL
Cholesterol: 193 mg/dL (ref 0–200)
HDL: 62.6 mg/dL (ref >39.00)
LDL Cholesterol: 112 mg/dL — ABNORMAL HIGH (ref 0–99)
NonHDL: 130.3
TRIGLYCERIDES: 92 mg/dL (ref 0.0–149.0)
Total CHOL/HDL Ratio: 3
VLDL: 18.4 mg/dL (ref 0.0–40.0)

## 2018-08-21 LAB — COMPREHENSIVE METABOLIC PANEL
ALK PHOS: 153 U/L — AB (ref 39–117)
ALT: 19 U/L (ref 0–35)
AST: 29 U/L (ref 0–37)
Albumin: 3.6 g/dL (ref 3.5–5.2)
BILIRUBIN TOTAL: 0.8 mg/dL (ref 0.2–1.2)
BUN: 19 mg/dL (ref 6–23)
CALCIUM: 9.8 mg/dL (ref 8.4–10.5)
CO2: 34 meq/L — AB (ref 19–32)
Chloride: 100 mEq/L (ref 96–112)
Creatinine, Ser: 4.86 mg/dL (ref 0.40–1.20)
GFR: 11.16 mL/min — AB (ref >60.00)
Glucose, Bld: 79 mg/dL (ref 70–99)
POTASSIUM: 4.4 meq/L (ref 3.5–5.1)
Sodium: 142 mEq/L (ref 135–145)
TOTAL PROTEIN: 7.5 g/dL (ref 6.0–8.3)

## 2018-08-21 LAB — VITAMIN B12: Vitamin B-12: 399 pg/mL (ref 211–911)

## 2018-08-21 LAB — TSH: TSH: 1.41 u[IU]/mL (ref 0.35–4.50)

## 2018-08-21 MED ORDER — CEFUROXIME AXETIL 250 MG PO TABS: 250.0000 mg | ORAL_TABLET | Freq: Two times a day (BID) | ORAL | 0 refills | Status: DC

## 2018-08-21 NOTE — Assessment & Plan Note (Signed)
Etiology ? UTI Culture pending If culture neg and labs neg---  Mri brain Neuropsych referral

## 2018-08-21 NOTE — Progress Notes (Signed)
Patient ID: [name] [name], female    DOB: 1941/02/17  Age: 77 y.o. MRN: [id number]    Subjective:  Subjective  HPI [name] [name] presents for memory loss .  Her daughter is with her.  They are concerned about UTi or other infection.  She apparently saw some deceased family members in her house.  She forgot she lived in the house for 30 years.  She has fallen 2x and hit her head about 2 weeks ago-- no loc then.   Memory better today per pt-- her daughter is with her  Review of Systems  Constitutional: Negative for chills and fever.  HENT: Negative for congestion and hearing loss.   Eyes: Negative for discharge.  Respiratory: Negative for cough and shortness of breath.   Cardiovascular: Negative for chest pain, palpitations and leg swelling.  Gastrointestinal: Negative for abdominal pain, blood in stool, constipation, diarrhea, nausea and vomiting.  Genitourinary: Negative for dysuria, frequency, hematuria and urgency.  Musculoskeletal: Negative for back pain and myalgias.  Skin: Negative for rash.  Allergic/Immunologic: Negative for environmental allergies.  Neurological: Positive for syncope and weakness. Negative for dizziness and headaches.  Hematological: Does not bruise/bleed easily.  Psychiatric/Behavioral: Positive for confusion and hallucinations. Negative for suicidal ideas. The patient is not nervous/anxious.     History Past Medical History:  Diagnosis Date  . DEPRESSION   . DIABETES MELLITUS, TYPE I   . GERD   . GLAUCOMA   . Headache(784.0)   . Hepatitis B carrier ([place])    05/2009: neg Hep C; Hep B: core pos, Surf neg; fatty liver US 8/10 - 7/13  . HYPERLIPIDEMIA   . HYPERTENSION   . Kidney failure    dialysis 3 times per week  . OSTEOPENIA   . POSTMENOPAUSAL STATUS   . Pulmonary edema   . Unspecified vitamin D deficiency     She has a past surgical history that includes Cholecystectomy (02/12/07); Tonsillectomy; Refractive surgery (07/29/09); Insertion of  dialysis catheter (Right, 05/27/2014); and AV fistula placement (Left, 05/29/2014).   Her family history includes Arthritis in her other; Coronary artery disease in her other; Diabetes in her other; Hypertension in her other; Kidney disease in her mother and sister.She reports that she has never smoked. She has never used smokeless tobacco. She reports that she does not drink alcohol or use drugs.  Current Outpatient Medications on File Prior to Visit  Medication Sig Dispense Refill  . calcitRIOL (ROCALTROL) 0.5 MCG capsule Take 3 capsules (1.5 mcg total) by mouth 3 (three) times a week. 15 capsule 0  . ferrous sulfate 325 (65 FE) MG tablet Take 325-650 mg by mouth daily with breakfast.    . isosorbide mononitrate (IMDUR) 30 MG 24 hr tablet TAKE 1 TABLET BY MOUTH TWICE A DAY 180 tablet 1  . Multiple Vitamin (MULTIVITAMIN WITH MINERALS) TABS tablet Take 1 tablet by mouth daily.     . nitroGLYCERIN (NITROSTAT) 0.4 MG SL tablet Place 1 tablet (0.4 mg total) under the tongue every 5 (five) minutes x 3 doses as needed for chest pain. 30 tablet 12   No current facility-administered medications on file prior to visit.      Objective:  Objective  Physical Exam  Constitutional: She is oriented to person, place, and time. She appears well-developed and well-nourished.  HENT:  Head: Normocephalic and atraumatic.  Eyes: Conjunctivae and EOM are normal.  Neck: Normal range of motion. Neck supple. No JVD present. Carotid bruit is not present. No thyromegaly present.  Cardiovascular: Normal rate, regular rhythm and normal heart sounds.  No murmur heard. Pulmonary/Chest: Effort normal and breath sounds normal. No respiratory distress. She has no wheezes. She has no rales. She exhibits no tenderness.  Musculoskeletal: She exhibits no edema.  Neurological: She is alert and oriented to person, place, and time.  mmse  18/30  Psychiatric: She has a normal mood and affect.  Nursing note and vitals  reviewed.  BP 122/70 (BP Location: Right Arm, Cuff Size: Normal)   Pulse 79   Temp 98.5 F (36.9 C) (Oral)   Resp 16   Ht 5\' 5"  (1.651 m)   Wt 123 lb 6.4 oz (56 kg)   SpO2 98%   BMI 20.53 kg/m  Wt Readings from Last 3 Encounters:  08/21/18 123 lb 6.4 oz (56 kg)  07/04/18 133 lb 12.8 oz (60.7 kg)  03/01/18 136 lb 12.8 oz (62.1 kg)     Lab Results  Component Value Date   WBC 2.7 (L) 08/21/2018   HGB 11.4 (L) 08/21/2018   HCT 35.8 (L) 08/21/2018   PLT 80.0 (L) 08/21/2018   GLUCOSE 79 08/21/2018   CHOL 193 08/21/2018   TRIG 92.0 08/21/2018   HDL 62.60 08/21/2018   LDLDIRECT 196.2 04/26/2013   LDLCALC 112 (H) 08/21/2018   ALT 19 08/21/2018   AST 29 08/21/2018   NA 142 08/21/2018   K 4.4 08/21/2018   CL 100 08/21/2018   CREATININE 4.86 (HH) 08/21/2018   BUN 19 08/21/2018   CO2 34 (H) 08/21/2018   TSH 1.41 08/21/2018   INR 1.05 07/20/2016   HGBA1C 4.8 11/15/2017   MICROALBUR 174.7 (H) 01/03/2008    Dg Chest 2 View  Result Date: 03/30/2018 CLINICAL DATA:  Cardiac palpitations.  Renal failure. EXAM: CHEST - 2 VIEW COMPARISON:  January 28, 2018 FINDINGS: There is no evident edema or consolidation. Heart size and pulmonary vascularity are normal. No adenopathy. There is aortic atherosclerosis. There is a stent in the left brachial region. No blastic or lytic bone lesions are evident. There is a probable calcified right axillary lymph node. IMPRESSION: No edema or consolidation. There is aortic atherosclerosis. Stent noted in left brachial region. Aortic Atherosclerosis (ICD10-I70.0). Electronically Signed   By: Lowella Grip III M.D.   On: 03/30/2018 13:27     Assessment & Plan:  Plan  I am having [name] [name] start on cefUROXime. I am also having her maintain her multivitamin with minerals, ferrous sulfate, calcitRIOL, nitroGLYCERIN, and isosorbide mononitrate.  Meds ordered this encounter  Medications  . cefUROXime (CEFTIN) 250 MG tablet    Sig: Take 1 tablet  (250 mg total) by mouth 2 (two) times daily with a meal.    Dispense:  10 tablet    Refill:  0    Problem List Items Addressed This Visit      Unprioritized   Memory loss - Primary    Etiology ? UTI Culture pending If culture neg and labs neg---  [facility]psych referral      Relevant Orders   POCT Urinalysis Dipstick (Automated) (Completed)   Urine Culture   CBC with Differential/Platelet (Completed)   Comprehensive metabolic panel (Completed)   Lipid panel (Completed)   TSH (Completed)   Vitamin B12 (Completed)   Vitamin D 1,25 dihydroxy   MR Brain Wo Contrast   Urinary tract infection without hematuria    abx per orders Check culture      Relevant Medications   cefUROXime (CEFTIN) 250 MG tablet  Other Visit Diagnoses    Hallucinations       Relevant Orders   POCT Urinalysis Dipstick (Automated) (Completed)   Urine Culture   CBC with Differential/Platelet (Completed)   Comprehensive metabolic panel (Completed)   Lipid panel (Completed)   TSH (Completed)   Vitamin B12 (Completed)   Vitamin D 1,25 dihydroxy   MR Brain Wo Contrast      Follow-up: Return in about 6 months (around 02/20/2019), or if symptoms worsen or fail to improve.  Ann Held, DO

## 2018-08-21 NOTE — Assessment & Plan Note (Signed)
abx per orders  Check culture  

## 2018-08-21 NOTE — Telephone Encounter (Signed)
Lab called about patient results  Creatinine: 4.83 Bun: 11.24

## 2018-08-21 NOTE — Patient Instructions (Signed)
Psychosis °Psychosis refers to a severe loss of contact with reality. During a psychotic episode, a person is not able to think clearly, and his or her emotions and responses do not match up with what is actually happening. Someone may have false beliefs about what is happening or who they are (delusions). Someone may see, hear, taste, smell, or feel things that are not present (hallucinations). °Psychosis usually occurs with very serious mental health (psychiatric) conditions such as schizophrenia, bipolar disorder, or major depression. It can sometimes also be the result of drug use or certain medical conditions. °What are the signs or symptoms? °Symptoms of a psychotic episode include: °· Delusions, such as: °? Feeling excessive fear or suspicion (paranoia). °? Believing something that is odd, unrealistic, or false, such as having a false belief about being someone else. °· Hallucinations. °· Disorganized thinking, such as thoughts that jump from one to another that do not make sense to others. °· Disorganized speech, such as saying things that do not make sense to others. °· Inappropriate behavior, such as talking to oneself or intruding on unfamiliar people. ° °How is this diagnosed? °A diagnosis of psychosis is made through an assessment by a health care provider, who will ask questions about thoughts, feelings, behavior, drug use, and medical conditions. The health care provider may also do one or more of the following: °· Physical exam. °· Blood tests. °· Brain imaging, such as a CT scan or MRI. °· Brain wave study (EEG). ° °The health care provider may make a referral for further evaluation by a mental health professional. °How is this treated? °Treatment depends on the cause of the psychosis. Treatment may include one or more of the following: °· Monitoring and supportive care in the emergency room or hospital. °· Taking medicines (antipsychotic medicine) to reduce symptoms and to balance chemicals in the  brain. °· Treating an underlying medical condition. °· Stopping or reducing drugs that are causing psychosis. °· Therapy and other supportive programs outside of the hospital. ° °Follow these instructions at home: °· Over-the-counter and prescription medicines should be taken only as told by the health care provider. °· The health care provider should be consulted before over-the-counter medicines, herbs, or supplements are used. °· All follow-up visits should be kept as told by the health care provider. This is important. °· A healthy lifestyle should be maintained. This includes: °? Eating a healthy diet. °? Getting enough sleep. °? Exercising regularly. °? Avoiding alcohol and recreational drugs as told by the health care provider. °Contact a health care provider if: °· Medicines do not seem to be helping. °· The person hears voices telling him or her to do things. °· The person continues to see, smell, or feel things that are not there. °· The person feels extremely fearful and suspicious that someone or something will harm him or her. °· The person feels unable to leave his or her house. °· The person has trouble taking care of himself or herself. °· The person experiences side effects of medicines, such as: °? Changes in sleep patterns. °? Dizziness. °? Weight gain. °? Restlessness. °? Movement changes. °? Muscle spasms. °? Tremors. °Get help right away if: °· Serious thoughts occur about self-harm or about hurting others. °· There are serious side effects of medicine, such as: °? Swelling of the face, lips, tongue, or throat. °? Fever, confusion, muscle spasms, or seizures. °This information is not intended to replace advice given to you by your health care provider.   Make sure you discuss any questions you have with your health care provider. °Document Released: 04/06/2010 Document Revised: 03/24/2016 Document Reviewed: 10/21/2014 °Elsevier Interactive Patient Education © 2018 [employer] ° °

## 2018-08-21 NOTE — Telephone Encounter (Signed)
Results faxed to nephrology

## 2018-08-21 NOTE — Telephone Encounter (Signed)
Pt is on dialysis--- fax to nephrology

## 2018-08-22 DIAGNOSIS — N2581 Secondary hyperparathyroidism of renal origin: Secondary | ICD-10-CM | POA: Diagnosis not present

## 2018-08-22 DIAGNOSIS — N186 End stage renal disease: Secondary | ICD-10-CM | POA: Diagnosis not present

## 2018-08-22 LAB — URINE CULTURE
MICRO NUMBER: 91268845
SPECIMEN QUALITY: ADEQUATE

## 2018-08-23 ENCOUNTER — Other Ambulatory Visit: Payer: Self-pay | Admitting: Family Medicine

## 2018-08-23 DIAGNOSIS — I10 Essential (primary) hypertension: Secondary | ICD-10-CM

## 2018-08-24 ENCOUNTER — Ambulatory Visit (HOSPITAL_COMMUNITY): Admission: RE | Admit: 2018-08-24 | Payer: Medicare HMO | Source: Ambulatory Visit

## 2018-08-24 ENCOUNTER — Encounter: Payer: Self-pay | Admitting: *Deleted

## 2018-08-24 DIAGNOSIS — N186 End stage renal disease: Secondary | ICD-10-CM | POA: Diagnosis not present

## 2018-08-24 DIAGNOSIS — N2581 Secondary hyperparathyroidism of renal origin: Secondary | ICD-10-CM | POA: Diagnosis not present

## 2018-08-26 LAB — VITAMIN D 1,25 DIHYDROXY
VITAMIN D3 1, 25 (OH): 9 pg/mL
Vitamin D 1, 25 (OH)2 Total: 55 pg/mL (ref 18–72)
Vitamin D2 1, 25 (OH)2: 46 pg/mL

## 2018-08-27 DIAGNOSIS — N186 End stage renal disease: Secondary | ICD-10-CM | POA: Diagnosis not present

## 2018-08-27 DIAGNOSIS — N2581 Secondary hyperparathyroidism of renal origin: Secondary | ICD-10-CM | POA: Diagnosis not present

## 2018-08-29 DIAGNOSIS — N186 End stage renal disease: Secondary | ICD-10-CM | POA: Diagnosis not present

## 2018-08-29 DIAGNOSIS — N2581 Secondary hyperparathyroidism of renal origin: Secondary | ICD-10-CM | POA: Diagnosis not present

## 2018-08-30 DIAGNOSIS — Z992 Dependence on renal dialysis: Secondary | ICD-10-CM | POA: Diagnosis not present

## 2018-08-30 DIAGNOSIS — N186 End stage renal disease: Secondary | ICD-10-CM | POA: Diagnosis not present

## 2018-08-30 DIAGNOSIS — E1122 Type 2 diabetes mellitus with diabetic chronic kidney disease: Secondary | ICD-10-CM | POA: Diagnosis not present

## 2018-09-03 ENCOUNTER — Observation Stay (HOSPITAL_COMMUNITY)
Admission: EM | Admit: 2018-09-03 | Discharge: 2018-09-04 | Disposition: A | Discharge status: Home / Self Care | Payer: Medicare HMO | Attending: Vascular Surgery | Admitting: Vascular Surgery

## 2018-09-03 ENCOUNTER — Other Ambulatory Visit: Payer: Self-pay

## 2018-09-03 ENCOUNTER — Encounter (HOSPITAL_COMMUNITY): Payer: Self-pay | Admitting: Emergency Medicine

## 2018-09-03 DIAGNOSIS — Z8249 Family history of ischemic heart disease and other diseases of the circulatory system: Secondary | ICD-10-CM | POA: Insufficient documentation

## 2018-09-03 DIAGNOSIS — Z841 Family history of disorders of kidney and ureter: Secondary | ICD-10-CM | POA: Diagnosis not present

## 2018-09-03 DIAGNOSIS — N186 End stage renal disease: Secondary | ICD-10-CM | POA: Insufficient documentation

## 2018-09-03 DIAGNOSIS — D631 Anemia in chronic kidney disease: Secondary | ICD-10-CM | POA: Insufficient documentation

## 2018-09-03 DIAGNOSIS — Z992 Dependence on renal dialysis: Secondary | ICD-10-CM | POA: Diagnosis not present

## 2018-09-03 DIAGNOSIS — I132 Hypertensive heart and chronic kidney disease with heart failure and with stage 5 chronic kidney disease, or end stage renal disease: Secondary | ICD-10-CM | POA: Diagnosis not present

## 2018-09-03 DIAGNOSIS — Z79899 Other long term (current) drug therapy: Secondary | ICD-10-CM | POA: Diagnosis not present

## 2018-09-03 DIAGNOSIS — E1122 Type 2 diabetes mellitus with diabetic chronic kidney disease: Secondary | ICD-10-CM | POA: Diagnosis not present

## 2018-09-03 DIAGNOSIS — I252 Old myocardial infarction: Secondary | ICD-10-CM | POA: Diagnosis not present

## 2018-09-03 DIAGNOSIS — I10 Essential (primary) hypertension: Secondary | ICD-10-CM | POA: Diagnosis not present

## 2018-09-03 DIAGNOSIS — Z86711 Personal history of pulmonary embolism: Secondary | ICD-10-CM | POA: Insufficient documentation

## 2018-09-03 DIAGNOSIS — T82838A Hemorrhage of vascular prosthetic devices, implants and grafts, initial encounter: Principal | ICD-10-CM | POA: Insufficient documentation

## 2018-09-03 DIAGNOSIS — N2581 Secondary hyperparathyroidism of renal origin: Secondary | ICD-10-CM | POA: Diagnosis not present

## 2018-09-03 DIAGNOSIS — R58 Hemorrhage, not elsewhere classified: Secondary | ICD-10-CM | POA: Diagnosis not present

## 2018-09-03 DIAGNOSIS — Y832 Surgical operation with anastomosis, bypass or graft as the cause of abnormal reaction of the patient, or of later complication, without mention of misadventure at the time of the procedure: Secondary | ICD-10-CM | POA: Diagnosis not present

## 2018-09-03 DIAGNOSIS — T82590A Other mechanical complication of surgically created arteriovenous fistula, initial encounter: Secondary | ICD-10-CM | POA: Diagnosis not present

## 2018-09-03 LAB — COMPREHENSIVE METABOLIC PANEL
ALK PHOS: 152 U/L — AB (ref 38–126)
ALT: 27 U/L (ref 0–44)
AST: 50 U/L — AB (ref 15–41)
Albumin: 2.9 g/dL — ABNORMAL LOW (ref 3.5–5.0)
Anion gap: 8 (ref 5–15)
BUN: 29 mg/dL — ABNORMAL HIGH (ref 8–23)
CALCIUM: 9.1 mg/dL (ref 8.9–10.3)
CHLORIDE: 100 mmol/L (ref 98–111)
CO2: 28 mmol/L (ref 22–32)
CREATININE: 6.02 mg/dL — AB (ref 0.44–1.00)
GFR, EST AFRICAN AMERICAN: 7 mL/min — AB (ref >60)
GFR, EST NON AFRICAN AMERICAN: 6 mL/min — AB (ref >60)
Glucose, Bld: 94 mg/dL (ref 70–99)
Potassium: 3.5 mmol/L (ref 3.5–5.1)
Sodium: 136 mmol/L (ref 135–145)
Total Bilirubin: 0.7 mg/dL (ref 0.3–1.2)
Total Protein: 6.4 g/dL — ABNORMAL LOW (ref 6.5–8.1)

## 2018-09-03 LAB — CBC WITH DIFFERENTIAL/PLATELET
ABS IMMATURE GRANULOCYTES: 0.01 10*3/uL (ref 0.00–0.07)
Basophils Absolute: 0 10*3/uL (ref 0.0–0.1)
Basophils Relative: 1 %
Eosinophils Absolute: 0.1 10*3/uL (ref 0.0–0.5)
Eosinophils Relative: 4 %
HEMATOCRIT: 33 % — AB (ref 36.0–46.0)
Hemoglobin: 9.8 g/dL — ABNORMAL LOW (ref 12.0–15.0)
IMMATURE GRANULOCYTES: 0 %
LYMPHS ABS: 0.6 10*3/uL — AB (ref 0.7–4.0)
LYMPHS PCT: 19 %
MCH: 28.8 pg (ref 26.0–34.0)
MCHC: 29.7 g/dL — ABNORMAL LOW (ref 30.0–36.0)
MCV: 97.1 fL (ref 80.0–100.0)
MONO ABS: 0.2 10*3/uL (ref 0.1–1.0)
MONOS PCT: 7 %
NEUTROS ABS: 2.1 10*3/uL (ref 1.7–7.7)
NEUTROS PCT: 69 %
PLATELETS: 64 10*3/uL — AB (ref 150–400)
RBC: 3.4 MIL/uL — ABNORMAL LOW (ref 3.87–5.11)
RDW: 20.3 % — ABNORMAL HIGH (ref 11.5–15.5)
WBC: 3.1 10*3/uL — ABNORMAL LOW (ref 4.0–10.5)
nRBC: 0 % (ref 0.0–0.2)

## 2018-09-03 LAB — APTT: aPTT: 32 seconds (ref 24–36)

## 2018-09-03 LAB — PROTIME-INR
INR: 1.11
PROTHROMBIN TIME: 14.2 s (ref 11.4–15.2)

## 2018-09-03 MED ORDER — ACETAMINOPHEN 325 MG PO TABS: 325.0000 mg | ORAL_TABLET | ORAL | Status: DC | PRN

## 2018-09-03 MED ORDER — PHENOL 1.4 % MT LIQD
1.0000 | OROMUCOSAL | Status: DC | PRN
  Filled 2018-09-03: qty 177

## 2018-09-03 MED ORDER — MORPHINE SULFATE (PF) 2 MG/ML IV SOLN: 2.0000 mg | INTRAVENOUS | Status: DC | PRN

## 2018-09-03 MED ORDER — SODIUM CHLORIDE 0.9% FLUSH: 3.0000 mL | INTRAVENOUS | Status: DC | PRN

## 2018-09-03 MED ORDER — SODIUM CHLORIDE 0.9% FLUSH
3.0000 mL | Freq: Two times a day (BID) | INTRAVENOUS | Status: DC
  Administered 2018-09-04 (×2): 3 mL via INTRAVENOUS

## 2018-09-03 MED ORDER — ONDANSETRON HCL 4 MG/2ML IJ SOLN: 4.0000 mg | Freq: Four times a day (QID) | INTRAMUSCULAR | Status: DC | PRN

## 2018-09-03 MED ORDER — METOPROLOL TARTRATE 5 MG/5ML IV SOLN: 2.0000 mg | INTRAVENOUS | Status: DC | PRN

## 2018-09-03 MED ORDER — ACETAMINOPHEN 325 MG RE SUPP: 325.0000 mg | RECTAL | Status: DC | PRN

## 2018-09-03 MED ORDER — SODIUM CHLORIDE 0.9 % IV SOLN: 250.0000 mL | INTRAVENOUS | Status: DC | PRN

## 2018-09-03 MED ORDER — OXYCODONE HCL 5 MG PO TABS: 5.0000 mg | ORAL_TABLET | ORAL | Status: DC | PRN

## 2018-09-03 MED ORDER — LABETALOL HCL 5 MG/ML IV SOLN: 10.0000 mg | INTRAVENOUS | Status: DC | PRN

## 2018-09-03 MED ORDER — PANTOPRAZOLE SODIUM 40 MG PO TBEC
40.0000 mg | DELAYED_RELEASE_TABLET | Freq: Every day | ORAL | Status: DC
Start: 2018-09-03 — End: 2018-09-04

## 2018-09-03 MED ORDER — POTASSIUM CHLORIDE CRYS ER 20 MEQ PO TBCR: 20.0000 meq | EXTENDED_RELEASE_TABLET | Freq: Once | ORAL | Status: DC

## 2018-09-03 MED ORDER — DOCUSATE SODIUM 100 MG PO CAPS: 100.0000 mg | ORAL_CAPSULE | Freq: Two times a day (BID) | ORAL | Status: DC

## 2018-09-03 MED ORDER — GUAIFENESIN-DM 100-10 MG/5ML PO SYRP: 15.0000 mL | ORAL_SOLUTION | ORAL | Status: DC | PRN

## 2018-09-03 NOTE — H&P (Signed)
Patient name: [name] [name] MRN: [id number] DOB: Aug 27, 1941 Sex: female   HPI: [name] [name] is a 77 y.o. female, with bleeding from left arm AV graft during dialysis today.  Bleeding stopped with direct pressure in ER.  HD day was today.  Graft was placed in 2015.  Past Medical History:  Diagnosis Date  . DEPRESSION   . DIABETES MELLITUS, TYPE I   . GERD   . GLAUCOMA   . Headache(784.0)   . Hepatitis B carrier ([place])    05/2009: neg Hep C; Hep B: core pos, Surf neg; fatty liver US 8/10 - 7/13  . HYPERLIPIDEMIA   . HYPERTENSION   . Kidney failure    dialysis 3 times per week  . OSTEOPENIA   . POSTMENOPAUSAL STATUS   . Pulmonary edema   . Unspecified vitamin D deficiency    Past Surgical History:  Procedure Laterality Date  . AV FISTULA PLACEMENT Left 05/29/2014   Procedure: INSERTION OF ARTERIOVENOUS (AV) GORE-TEX GRAFT ARM-LEFT;  Surgeon: Angelia Mould, MD;  Location: [facility];  Service: Vascular;  Laterality: Left;  . CHOLECYSTECTOMY  02/12/07  . INSERTION OF DIALYSIS CATHETER Right 05/27/2014   Procedure: INSERTION OF DIALYSIS CATHETER;  Surgeon: Angelia Mould, MD;  Location: [facility];  Service: Vascular;  Laterality: Right;  . REFRACTIVE SURGERY  07/29/09   Dr. Bing Plume  . TONSILLECTOMY      Family History  Problem Relation Age of Onset  . Kidney disease Mother   . Coronary artery disease Other   . Diabetes Other   . Hypertension Other   . Arthritis Other   . Kidney disease Sister     SOCIAL HISTORY: Social History   Socioeconomic History  . Marital status: Married    Spouse name: Not on file  . Number of children: Not on file  . Years of education: Not on file  . Highest education level: Not on file  Occupational History  . Not on file  Social Needs  . Financial resource strain: Not on file  . Food insecurity:    Worry: Not on file    Inability: Not on file  . Transportation needs:    Medical: Not on file    Non-medical: Not on file    Tobacco Use  . Smoking status: Never Smoker  . Smokeless tobacco: Never Used  . Tobacco comment: Married x's 33yrs, 6 kids-scattered [employer]. Retired Consulting civil engineer  Substance and Sexual Activity  . Alcohol use: No  . Drug use: No  . Sexual activity: Not Currently    Birth control/protection: None  Lifestyle  . Physical activity:    Days per week: Not on file    Minutes per session: Not on file  . Stress: Not on file  Relationships  . Social connections:    Talks on phone: Not on file    Gets together: Not on file    Attends religious service: Not on file    Active member of club or organization: Not on file    Attends meetings of clubs or organizations: Not on file    Relationship status: Not on file  . Intimate partner violence:    Fear of current or ex partner: Not on file    Emotionally abused: Not on file    Physically abused: Not on file    Forced sexual activity: Not on file  Other Topics Concern  . Not on file  Social History Narrative  . Not on  file    Allergies  Allergen Reactions  . Hydralazine Itching  . Robaxin [Methocarbamol]     Dizziness     No current facility-administered medications for this encounter.    Current Outpatient Medications  Medication Sig Dispense Refill  . calcitRIOL (ROCALTROL) 0.5 MCG capsule Take 3 capsules (1.5 mcg total) by mouth 3 (three) times a week. 15 capsule 0  . carvedilol (COREG) 25 MG tablet TAKE 1 TABLET (25 MG TOTAL) BY MOUTH 2 (TWO) TIMES DAILY WITH A MEAL. 180 tablet 1  . cefUROXime (CEFTIN) 250 MG tablet Take 1 tablet (250 mg total) by mouth 2 (two) times daily with a meal. 10 tablet 0  . ferrous sulfate 325 (65 FE) MG tablet Take 325-650 mg by mouth daily with breakfast.    . isosorbide mononitrate (IMDUR) 30 MG 24 hr tablet TAKE 1 TABLET BY MOUTH TWICE A DAY 180 tablet 1  . Multiple Vitamin (MULTIVITAMIN WITH MINERALS) TABS tablet Take 1 tablet by mouth daily.     . nitroGLYCERIN (NITROSTAT) 0.4 MG SL tablet  Place 1 tablet (0.4 mg total) under the tongue every 5 (five) minutes x 3 doses as needed for chest pain. 30 tablet 12    ROS:   General:  No weight loss, Fever, chills  HEENT: No recent headaches, no nasal bleeding, no visual changes, no sore throat  Neurologic: No dizziness, blackouts, seizures. No recent symptoms of stroke or mini- stroke. No recent episodes of slurred speech, or temporary blindness.  Cardiac: No recent episodes of chest pain/pressure, no shortness of breath at rest.  No shortness of breath with exertion.  Denies history of atrial fibrillation or irregular heartbeat  Vascular: No history of rest pain in feet.  No history of claudication.  No history of non-healing ulcer, No history of DVT   Pulmonary: No home oxygen, no productive cough, no hemoptysis,  No asthma or wheezing  Musculoskeletal:  [ ]  Arthritis, [ ]  Low back pain,  [ ]  Joint pain  Hematologic:No history of hypercoagulable state.  No history of easy bleeding.  No history of anemia  Gastrointestinal: No hematochezia or melena,  No gastroesophageal reflux, no trouble swallowing  Urinary: [X]  chronic Kidney disease, [X]  on HD - [X]  MWF or [ ]  TTHS, [ ]  Burning with urination, [ ]  Frequent urination, [ ]  Difficulty urinating;   Skin: No rashes  Psychological: No history of anxiety,  No history of depression   Physical Examination  Vitals:   09/03/18 1620 09/03/18 1627 09/03/18 1853 09/03/18 2008  BP:  (!) 148/67 (!) 140/98 95/80  Pulse:  67 75 66  Resp:  18 14 16   Temp:  98.5 F (36.9 C) 98.8 F (37.1 C) 98.8 F (37.1 C)  TempSrc:  Oral Oral Oral  SpO2: 98% 98% 100% 100%    There is no height or weight on file to calculate BMI.  General:  Alert and oriented, no acute distress HEENT: Normal Neck: No JVD Pulmonary: Clear to auscultation bilaterally Cardiac: Regular Rate and Rhythm  Skin: No rash Extremity Pulses:  Pulsatile left upper arm AV graft 1 needle still in place removed  hemostasis with 10 min direct pressure Musculoskeletal: No deformity or edema  Neurologic: Upper and lower extremity motor 5/5 and symmetric  DATA:  CBC    Component Value Date/Time   WBC 2.7 (L) 08/21/2018 1151   RBC 3.83 (L) 08/21/2018 1151   HGB 11.4 (L) 08/21/2018 1151   HCT 35.8 (L) 08/21/2018 1151  PLT 80.0 (L) 08/21/2018 1151   MCV 93.6 08/21/2018 1151   MCH 31.3 03/30/2018 1307   MCHC 31.7 08/21/2018 1151   RDW 22.4 (H) 08/21/2018 1151   LYMPHSABS 0.4 (L) 08/21/2018 1151   MONOABS 0.3 08/21/2018 1151   EOSABS 0.1 08/21/2018 1151   BASOSABS 0.0 08/21/2018 1151    BMET    Component Value Date/Time   NA 142 08/21/2018 1151   K 4.4 08/21/2018 1151   CL 100 08/21/2018 1151   CO2 34 (H) 08/21/2018 1151   GLUCOSE 79 08/21/2018 1151   GLUCOSE 70 10/10/2006 1540   BUN 19 08/21/2018 1151   CREATININE 4.86 (HH) 08/21/2018 1151   CALCIUM 9.8 08/21/2018 1151   GFRNONAA 13 (L) 03/30/2018 1307   GFRAA 15 (L) 03/30/2018 1307     ASSESSMENT: Bleeding AV graft now stopped with pressure.  Most likely venous outflow stenosis.   PLAN:  Will admit for obs with plan to do shuntogram tomorrow Npo p midnight. Consent Check AM labs Pancytopenia with decreased platelets may be contributing.   Ruta Hinds, MD [facility] Office: [phone] Pager: [phone]

## 2018-09-03 NOTE — ED Provider Notes (Signed)
[facility] EMERGENCY DEPARTMENT Provider Note   CSN: [id number] Arrival date & time: 09/03/18  1616     History   Chief Complaint Chief Complaint  Patient presents with  . Vascular Access Problem    HPI [name] [name] is a 77 y.o. female.  HPI Patient presents with bleeding from her left upper extremity AV fistula which started around 2 PM this afternoon while at dialysis.  She was only able to complete 2 of 4 hours.  Multiple pressure bandages placed by EMS.  Patient denies lightheadedness or dizziness. Past Medical History:  Diagnosis Date  . DEPRESSION   . DIABETES MELLITUS, TYPE I   . GERD   . GLAUCOMA   . Headache(784.0)   . Hepatitis B carrier ([place])    05/2009: neg Hep C; Hep B: core pos, Surf neg; fatty liver US 8/10 - 7/13  . HYPERLIPIDEMIA   . HYPERTENSION   . Kidney failure    dialysis 3 times per week  . OSTEOPENIA   . POSTMENOPAUSAL STATUS   . Pulmonary edema   . Unspecified vitamin D deficiency     Patient Active Problem List   Diagnosis Date Noted  . AV graft malfunction (HCC) 09/03/2018  . Urinary tract infection without hematuria 08/21/2018  . Memory loss 08/21/2018  . Anemia, chronic disease 03/01/2018  . Vasovagal syncope 03/01/2018  . Macrocytic anemia 01/28/2018  . Orthostatic syncope 01/28/2018  . EKG, abnormal   . Acute metabolic encephalopathy 41/96/2229  . Influenza 11/16/2017  . Fever in adult 11/15/2017  . Demand ischemia ([place]) 11/10/2017  . Normocytic anemia 11/10/2017  . Aortic atherosclerosis ([place]) 11/10/2017  . Respiratory failure with hypoxia ([place]) 08/18/2017  . Volume overload 08/18/2017  . Dialysis patient, noncompliant ([place]) 08/17/2017  . Thumb pain, left 12/22/2016  . Chronic diastolic heart failure ([place]) 11/25/2016  . ESRD (end stage renal disease) on dialysis ([place]) 06/25/2014  . Malignant hypertension 05/17/2014  . Chest pain 05/17/2014  . Elevated troponin 05/17/2014  . Hypertensive emergency  05/17/2014  . NSTEMI (non-ST elevated myocardial infarction) ([place]) 05/17/2014  . Chronic kidney disease, stage IV (severe) ([place]) 12/05/2013  . GERD 06/17/2009  . Hepatitis B carrier ([place]) 06/17/2009  . DEPRESSION 05/20/2009  . Unspecified glaucoma 05/20/2009  . HEADACHE 05/20/2009  . CARDIAC MURMUR 05/20/2009  . OSTEOPENIA 02/15/2008  . POSTMENOPAUSAL STATUS 12/21/2007  . Hyperlipidemia LDL goal <70 09/21/2007  . Essential hypertension 09/13/2007  . Type II diabetes mellitus with nephropathy [facility])     Past Surgical History:  Procedure Laterality Date  . AV FISTULA PLACEMENT Left 05/29/2014   Procedure: INSERTION OF ARTERIOVENOUS (AV) GORE-TEX GRAFT ARM-LEFT;  Surgeon: Angelia Mould, MD;  Location: [facility];  Service: Vascular;  Laterality: Left;  . CHOLECYSTECTOMY  02/12/07  . INSERTION OF DIALYSIS CATHETER Right 05/27/2014   Procedure: INSERTION OF DIALYSIS CATHETER;  Surgeon: Angelia Mould, MD;  Location: [facility];  Service: Vascular;  Laterality: Right;  . REFRACTIVE SURGERY  07/29/09   Dr. Bing Plume  . TONSILLECTOMY       OB History   None      Home Medications    Prior to Admission medications   Medication Sig Start Date End Date Taking? Authorizing Provider  Acetaminophen (TYLENOL PO) Take 1 tablet by mouth daily as needed (pain/headache).   Yes [provider]  amLODipine (NORVASC) 10 MG tablet Take 10 mg by mouth daily.   Yes [provider]  B Complex-C-Zn-Folic Acid (DIALYVITE/ZINC) TABS  Take 1 tablet by mouth daily.   Yes [provider]  carvedilol (COREG) 25 MG tablet TAKE 1 TABLET (25 MG TOTAL) BY MOUTH 2 (TWO) TIMES DAILY WITH A MEAL. 08/24/18  Yes Roma Schanz R, DO  isosorbide mononitrate (IMDUR) 30 MG 24 hr tablet TAKE 1 TABLET BY MOUTH TWICE A DAY Patient taking differently: Take 30 mg by mouth 2 (two) times daily.  05/29/18  Yes Roma Schanz R, DO  PRESCRIPTION MEDICATION Take 1 tablet by mouth 3 (three) times  daily with meals.   Yes [provider]  calcitRIOL (ROCALTROL) 0.5 MCG capsule Take 3 capsules (1.5 mcg total) by mouth 3 (three) times a week. 11/13/17   Jani Gravel, MD  cefUROXime (CEFTIN) 250 MG tablet Take 1 tablet (250 mg total) by mouth 2 (two) times daily with a meal. 08/21/18   Carollee Herter, Alferd Apa, DO  ferrous sulfate 325 (65 FE) MG tablet Take 325-650 mg by mouth daily with breakfast.    [provider]  Multiple Vitamin (MULTIVITAMIN WITH MINERALS) TABS tablet Take 1 tablet by mouth daily.     [provider]  nitroGLYCERIN (NITROSTAT) 0.4 MG SL tablet Place 1 tablet (0.4 mg total) under the tongue every 5 (five) minutes x 3 doses as needed for chest pain. 01/29/18   Mendel Corning, MD    Family History Family History  Problem Relation Age of Onset  . Kidney disease Mother   . Coronary artery disease Other   . Diabetes Other   . Hypertension Other   . Arthritis Other   . Kidney disease Sister     Social History Social History   Tobacco Use  . Smoking status: Never Smoker  . Smokeless tobacco: Never Used  . Tobacco comment: Married x's 63yrs, 6 kids-scattered [employer]. Retired Consulting civil engineer  Substance Use Topics  . Alcohol use: No  . Drug use: No     Allergies   Hydralazine and Robaxin [methocarbamol]   Review of Systems Review of Systems  Constitutional: Negative for chills and fever.  Respiratory: Negative for shortness of breath.   Cardiovascular: Negative for chest pain.  Gastrointestinal: Negative for abdominal pain, constipation, diarrhea, nausea and vomiting.  Musculoskeletal: Negative for back pain, myalgias and neck pain.  Skin: Positive for wound. Negative for rash.  Neurological: Negative for dizziness, weakness, light-headedness, numbness and headaches.     Physical Exam Updated Vital Signs BP (!) 135/95   Pulse 64   Temp 98.8 F (37.1 C) (Oral)   Resp 15   SpO2 100%   Physical Exam  Constitutional: She is  oriented to person, place, and time. She appears well-developed and well-nourished.  HENT:  Head: Normocephalic and atraumatic.  Eyes: Pupils are equal, round, and reactive to light. EOM are normal.  Neck: Normal range of motion. Neck supple.  Cardiovascular: Normal rate and regular rhythm.  Pulmonary/Chest: Effort normal and breath sounds normal.  Abdominal: Soft. Bowel sounds are normal. There is no tenderness. There is no rebound and no guarding.  Musculoskeletal: Normal range of motion. She exhibits no edema or tenderness.  Took down pressure bandage of left upper extremity.  Pulsatile bright red bleeding from graft.  Direct pressure applied.  Neurological: She is alert and oriented to person, place, and time.  Skin: Skin is warm and dry. No rash noted. No erythema.  Psychiatric: She has a normal mood and affect. Her behavior is normal.  Nursing note and vitals reviewed.    ED  Treatments / Results  Labs (all labs ordered are listed, but only abnormal results are displayed) Labs Reviewed  CBC WITH DIFFERENTIAL/PLATELET - Abnormal; Notable for the following components:      Result Value   WBC 3.1 (*)    RBC 3.40 (*)    Hemoglobin 9.8 (*)    HCT 33.0 (*)    MCHC 29.7 (*)    RDW 20.3 (*)    Platelets 64 (*)    Lymphs Abs 0.6 (*)    All other components within normal limits  COMPREHENSIVE METABOLIC PANEL - Abnormal; Notable for the following components:   BUN 29 (*)    Creatinine, Ser 6.02 (*)    Total Protein 6.4 (*)    Albumin 2.9 (*)    AST 50 (*)    Alkaline Phosphatase 152 (*)    GFR calc non Af Amer 6 (*)    GFR calc Af Amer 7 (*)    All other components within normal limits  CBC - Abnormal; Notable for the following components:   WBC 3.1 (*)    RBC 3.41 (*)    Hemoglobin 9.7 (*)    HCT 33.7 (*)    MCHC 28.8 (*)    RDW 20.1 (*)    Platelets 72 (*)    All other components within normal limits  BASIC METABOLIC PANEL - Abnormal; Notable for the following  components:   Potassium 3.4 (*)    Glucose, Bld 66 (*)    BUN 27 (*)    Creatinine, Ser 6.03 (*)    GFR calc non Af Amer 6 (*)    GFR calc Af Amer 7 (*)    All other components within normal limits  PROTIME-INR  APTT    EKG None  Radiology No results found.  Procedures Procedures (including critical care time)  Medications Ordered in ED Medications - No data to display   Initial Impression / Assessment and Plan / ED Course  I have reviewed the triage vital signs and the nursing notes.  Pertinent labs & imaging results that were available during my care of the patient were reviewed by me and considered in my medical decision making (see chart for details).     Spoke with OR nurse for Dr. Oneida Alar.  Will evaluate patient in the emergency department.  Direct pressure is being held.  We will check hemoglobin and coags. Dr. Oneida Alar to admit.  Bleeding is controlled at this time Final Clinical Impressions(s) / ED Diagnoses   Final diagnoses:  AV graft malfunction, initial encounter [facility])    ED Discharge Orders    None       Julianne Rice, MD 09/05/18 1554

## 2018-09-03 NOTE — ED Provider Notes (Cosign Needed)
Patient placed in Quick Look pathway, seen and evaluated   Chief Complaint: bleeding from fistula  HPI: 77 y.o. female here with bleeding from fistula. Patient brought to the ED via EMS from dialysis after they could only do 2 out of 4 hours of dialysis due to the bleeding.   ROS: M/S: bleeding from fistula left arm  Physical Exam: BP (!) 148/67 (BP Location: Right Arm)   Pulse 67   Temp 98.5 F (36.9 C) (Oral)   Resp 18   SpO2 98%     Gen: No distress  Neuro: Awake and Alert  Skin: dressing in place left arm with small amount of bleeding seen through the dressing.      Initiation of care has begun. The patient has been counseled on the process, plan, and necessity for staying for the completion/evaluation, and the remainder of the medical screening examination    Ashley Murrain, NP 09/03/18 1631

## 2018-09-03 NOTE — ED Triage Notes (Signed)
Per [facility]: Patient to ED from dialysis center for uncontrolled bleeding from her fistula - received on 2 out of the normal 4 hours. Bleeding currently controlled with pressure bandage. Patient has no complaints. EMS VS: 173/66, P 62 regular, 98% RA.

## 2018-09-04 ENCOUNTER — Encounter (HOSPITAL_COMMUNITY)
Admission: EM | Disposition: A | Discharge status: Home / Self Care | Payer: Self-pay | Source: Home / Self Care | Attending: Emergency Medicine

## 2018-09-04 DIAGNOSIS — N186 End stage renal disease: Secondary | ICD-10-CM | POA: Diagnosis not present

## 2018-09-04 DIAGNOSIS — T82838A Hemorrhage of vascular prosthetic devices, implants and grafts, initial encounter: Secondary | ICD-10-CM | POA: Diagnosis not present

## 2018-09-04 DIAGNOSIS — Z992 Dependence on renal dialysis: Secondary | ICD-10-CM | POA: Diagnosis not present

## 2018-09-04 LAB — CBC
HEMATOCRIT: 33.7 % — AB (ref 36.0–46.0)
Hemoglobin: 9.7 g/dL — ABNORMAL LOW (ref 12.0–15.0)
MCH: 28.4 pg (ref 26.0–34.0)
MCHC: 28.8 g/dL — AB (ref 30.0–36.0)
MCV: 98.8 fL (ref 80.0–100.0)
Platelets: 72 10*3/uL — ABNORMAL LOW (ref 150–400)
RBC: 3.41 MIL/uL — ABNORMAL LOW (ref 3.87–5.11)
RDW: 20.1 % — ABNORMAL HIGH (ref 11.5–15.5)
WBC: 3.1 10*3/uL — AB (ref 4.0–10.5)
nRBC: 0 % (ref 0.0–0.2)

## 2018-09-04 LAB — BASIC METABOLIC PANEL
ANION GAP: 7 (ref 5–15)
BUN: 27 mg/dL — ABNORMAL HIGH (ref 8–23)
CALCIUM: 9.2 mg/dL (ref 8.9–10.3)
CO2: 26 mmol/L (ref 22–32)
Chloride: 106 mmol/L (ref 98–111)
Creatinine, Ser: 6.03 mg/dL — ABNORMAL HIGH (ref 0.44–1.00)
GFR, EST AFRICAN AMERICAN: 7 mL/min — AB (ref >60)
GFR, EST NON AFRICAN AMERICAN: 6 mL/min — AB (ref >60)
Glucose, Bld: 66 mg/dL — ABNORMAL LOW (ref 70–99)
Potassium: 3.4 mmol/L — ABNORMAL LOW (ref 3.5–5.1)
Sodium: 139 mmol/L (ref 135–145)

## 2018-09-04 SURGERY — A/V SHUNTOGRAM
Anesthesia: LOCAL

## 2018-09-04 NOTE — ED Notes (Signed)
Patient ambulatory to bathroom with steady gait at this time 

## 2018-09-04 NOTE — ED Notes (Signed)
Pt stated she was told that she was not having procedure today yet could not eat, and for this reason she wishes to leave. IV removed. Explained delay in admission to pt and informed that she might have to start the process all over again if she had to come back. Pt expressed understanding.

## 2018-09-04 NOTE — Progress Notes (Signed)
[facility]  Subjective  - feels ok   Objective 120/76 (!) 50 98.8 F (37.1 C) (Oral) (!) 22 100% No intake or output data in the 24 hours ending 09/04/18 0733  + thrill in graft No further bleeding  Assessment/Planning: shuntogram later today NPO  Ruta Hinds 09/04/2018 7:33 AM --  Laboratory Lab Results: Recent Labs    09/03/18 2128 09/04/18 0214  WBC 3.1* 3.1*  HGB 9.8* 9.7*  HCT 33.0* 33.7*  PLT 64* 72*   BMET Recent Labs    09/03/18 2128 09/04/18 0214  NA 136 139  K 3.5 3.4*  CL 100 106  CO2 28 26  GLUCOSE 94 66*  BUN 29* 27*  CREATININE 6.02* 6.03*  CALCIUM 9.1 9.2    COAG Lab Results  Component Value Date   INR 1.11 09/03/2018   INR 1.05 07/20/2016   INR 1.09 05/20/2014   No results found for: PTT

## 2018-09-04 NOTE — ED Notes (Addendum)
Patient placed on hospital bed for comfort; ambulated to bathroom unassisted.

## 2018-09-04 NOTE — ED Notes (Signed)
PAGED VASCULAR TO RN

## 2018-09-04 NOTE — ED Notes (Signed)
Pt verbalizes understanding of risks and benefits of leaving against medical advice. AMA form signed, pt discharged out of ED ambulatory with husband.

## 2018-09-04 NOTE — ED Notes (Signed)
Pt refusing to sign consent, stating she has more important thing to do today than wait around here all day. Will notify MD.

## 2018-09-05 ENCOUNTER — Encounter: Payer: Self-pay | Admitting: *Deleted

## 2018-09-05 ENCOUNTER — Other Ambulatory Visit: Payer: Self-pay

## 2018-09-05 ENCOUNTER — Other Ambulatory Visit: Payer: Self-pay | Admitting: *Deleted

## 2018-09-05 ENCOUNTER — Emergency Department (HOSPITAL_COMMUNITY)
Admission: EM | Admit: 2018-09-05 | Discharge: 2018-09-05 | Disposition: A | Discharge status: Home / Self Care | Payer: Medicare HMO | Attending: Emergency Medicine | Admitting: Emergency Medicine

## 2018-09-05 ENCOUNTER — Encounter (HOSPITAL_COMMUNITY): Payer: Self-pay | Admitting: Emergency Medicine

## 2018-09-05 DIAGNOSIS — I120 Hypertensive chronic kidney disease with stage 5 chronic kidney disease or end stage renal disease: Secondary | ICD-10-CM | POA: Insufficient documentation

## 2018-09-05 DIAGNOSIS — Z992 Dependence on renal dialysis: Secondary | ICD-10-CM | POA: Diagnosis not present

## 2018-09-05 DIAGNOSIS — R05 Cough: Secondary | ICD-10-CM | POA: Diagnosis not present

## 2018-09-05 DIAGNOSIS — E1022 Type 1 diabetes mellitus with diabetic chronic kidney disease: Secondary | ICD-10-CM | POA: Diagnosis not present

## 2018-09-05 DIAGNOSIS — T82590D Other mechanical complication of surgically created arteriovenous fistula, subsequent encounter: Secondary | ICD-10-CM

## 2018-09-05 DIAGNOSIS — N186 End stage renal disease: Secondary | ICD-10-CM | POA: Insufficient documentation

## 2018-09-05 DIAGNOSIS — N2581 Secondary hyperparathyroidism of renal origin: Secondary | ICD-10-CM | POA: Diagnosis not present

## 2018-09-05 DIAGNOSIS — Y69 Unspecified misadventure during surgical and medical care: Secondary | ICD-10-CM | POA: Diagnosis not present

## 2018-09-05 LAB — CBC
HCT: 32.9 % — ABNORMAL LOW (ref 36.0–46.0)
Hemoglobin: 9.5 g/dL — ABNORMAL LOW (ref 12.0–15.0)
MCH: 28.5 pg (ref 26.0–34.0)
MCHC: 28.9 g/dL — AB (ref 30.0–36.0)
MCV: 98.8 fL (ref 80.0–100.0)
PLATELETS: 65 10*3/uL — AB (ref 150–400)
RBC: 3.33 MIL/uL — AB (ref 3.87–5.11)
RDW: 19.9 % — ABNORMAL HIGH (ref 11.5–15.5)
WBC: 2.8 10*3/uL — ABNORMAL LOW (ref 4.0–10.5)
nRBC: 0 % (ref 0.0–0.2)

## 2018-09-05 LAB — BASIC METABOLIC PANEL
Anion gap: 10 (ref 5–15)
BUN: 40 mg/dL — AB (ref 8–23)
CHLORIDE: 102 mmol/L (ref 98–111)
CO2: 27 mmol/L (ref 22–32)
CREATININE: 6.69 mg/dL — AB (ref 0.44–1.00)
Calcium: 9.3 mg/dL (ref 8.9–10.3)
GFR calc Af Amer: 6 mL/min — ABNORMAL LOW (ref >60)
GFR, EST NON AFRICAN AMERICAN: 5 mL/min — AB (ref >60)
GLUCOSE: 80 mg/dL (ref 70–99)
POTASSIUM: 4.2 mmol/L (ref 3.5–5.1)
Sodium: 139 mmol/L (ref 135–145)

## 2018-09-05 LAB — SAMPLE TO BLOOD BANK

## 2018-09-05 NOTE — ED Triage Notes (Signed)
Pt returns to ED asking for evaluation of her blood levels, states she thinks needs a blood transfusion would like to continue with plan for obs admit and shuntogram. NPO since midnight. Denies CP, SOB, weakness, dizziness. C/o dry mouth. Fistula left arm fistula  with thrill. Pt left AMA yesterday because she states she was hungry and "they weren't feeding me." Pt alert, oriented x3, unsure of date, VSS.

## 2018-09-05 NOTE — ED Notes (Signed)
Upon discharge, patient refused wheelchair. Patient walked out with her husband.

## 2018-09-05 NOTE — Progress Notes (Signed)
Confirmed with Dr. Billy Fischer at [facility] ER instructions received via fax. She will review with patient prior to discharge from ER.

## 2018-09-05 NOTE — ED Provider Notes (Signed)
[facility] EMERGENCY DEPARTMENT Provider Note   CSN: [id number] Arrival date & time: 09/05/18  0809     History   Chief Complaint Chief Complaint  Patient presents with  . Vascular Access Problem    HPI [name] [name] is a 77 y.o. female.  HPI   77 year old female with a history below presents with wanting to continue work-up that was initiated the night before last when she presented with AV graft bleeding.  She had presented on 11/4 with bleeding from her left upper extremity graft.  The bleeding was controlled with pressure, and vascular surgery was consulted.  Dr. Oneida Alar was admitting for observation, and planning to do a shuntogram, however patient became frustrated with having to wait without eating, and left AGAINST MEDICAL ADVICE.  She returns today with desire to continue work-up she had done.  She reports that someone had told her that she might need a transfusion.  She denies any symptoms, including no fatigue, shortness of breath, chest pain, lightheadedness or syncope.  Reports she had lost a lot of blood at dialysis when she had the bleeding graft.  Denies any other concerns, including no fevers, urinary symptoms, change in chronic cough, numbness or weakness.  Reports she is supposed to have dialysis today.  Past Medical History:  Diagnosis Date  . DEPRESSION   . DIABETES MELLITUS, TYPE I   . GERD   . GLAUCOMA   . Headache(784.0)   . Hepatitis B carrier ([place])    05/2009: neg Hep C; Hep B: core pos, Surf neg; fatty liver US 8/10 - 7/13  . HYPERLIPIDEMIA   . HYPERTENSION   . Kidney failure    dialysis 3 times per week  . OSTEOPENIA   . POSTMENOPAUSAL STATUS   . Pulmonary edema   . Unspecified vitamin D deficiency     Patient Active Problem List   Diagnosis Date Noted  . AV graft malfunction (HCC) 09/03/2018  . Urinary tract infection without hematuria 08/21/2018  . Memory loss 08/21/2018  . Anemia, chronic disease 03/01/2018  . Vasovagal  syncope 03/01/2018  . Macrocytic anemia 01/28/2018  . Orthostatic syncope 01/28/2018  . EKG, abnormal   . Acute metabolic encephalopathy 25/95/6387  . Influenza 11/16/2017  . Fever in adult 11/15/2017  . Demand ischemia ([place]) 11/10/2017  . Normocytic anemia 11/10/2017  . Aortic atherosclerosis ([place]) 11/10/2017  . Respiratory failure with hypoxia ([place]) 08/18/2017  . Volume overload 08/18/2017  . Dialysis patient, noncompliant ([place]) 08/17/2017  . Thumb pain, left 12/22/2016  . Chronic diastolic heart failure ([place]) 11/25/2016  . ESRD (end stage renal disease) on dialysis ([place]) 06/25/2014  . Malignant hypertension 05/17/2014  . Chest pain 05/17/2014  . Elevated troponin 05/17/2014  . Hypertensive emergency 05/17/2014  . NSTEMI (non-ST elevated myocardial infarction) ([place]) 05/17/2014  . Chronic kidney disease, stage IV (severe) ([place]) 12/05/2013  . GERD 06/17/2009  . Hepatitis B carrier ([place]) 06/17/2009  . DEPRESSION 05/20/2009  . Unspecified glaucoma 05/20/2009  . HEADACHE 05/20/2009  . CARDIAC MURMUR 05/20/2009  . OSTEOPENIA 02/15/2008  . POSTMENOPAUSAL STATUS 12/21/2007  . Hyperlipidemia LDL goal <70 09/21/2007  . Essential hypertension 09/13/2007  . Type II diabetes mellitus with nephropathy [facility])     Past Surgical History:  Procedure Laterality Date  . AV FISTULA PLACEMENT Left 05/29/2014   Procedure: INSERTION OF ARTERIOVENOUS (AV) GORE-TEX GRAFT ARM-LEFT;  Surgeon: Angelia Mould, MD;  Location: [facility];  Service: Vascular;  Laterality: Left;  . CHOLECYSTECTOMY  02/12/07  .  INSERTION OF DIALYSIS CATHETER Right 05/27/2014   Procedure: INSERTION OF DIALYSIS CATHETER;  Surgeon: Angelia Mould, MD;  Location: [facility];  Service: Vascular;  Laterality: Right;  . REFRACTIVE SURGERY  07/29/09   Dr. Bing Plume  . TONSILLECTOMY       OB History   None      Home Medications    Prior to Admission medications   Medication Sig Start Date End Date Taking? Authorizing  Provider  Acetaminophen (TYLENOL PO) Take 1 tablet by mouth daily as needed (pain/headache).    [provider]  amLODipine (NORVASC) 10 MG tablet Take 10 mg by mouth daily.    [provider]  B Complex-C-Zn-Folic Acid (DIALYVITE/ZINC) TABS Take 1 tablet by mouth daily.    [provider]  calcitRIOL (ROCALTROL) 0.5 MCG capsule Take 3 capsules (1.5 mcg total) by mouth 3 (three) times a week. 11/13/17   Jani Gravel, MD  carvedilol (COREG) 25 MG tablet TAKE 1 TABLET (25 MG TOTAL) BY MOUTH 2 (TWO) TIMES DAILY WITH A MEAL. 08/24/18   Ann Held, DO  cefUROXime (CEFTIN) 250 MG tablet Take 1 tablet (250 mg total) by mouth 2 (two) times daily with a meal. 08/21/18   Carollee Herter, Alferd Apa, DO  ferrous sulfate 325 (65 FE) MG tablet Take 325-650 mg by mouth daily with breakfast.    [provider]  isosorbide mononitrate (IMDUR) 30 MG 24 hr tablet TAKE 1 TABLET BY MOUTH TWICE A DAY Patient taking differently: Take 30 mg by mouth 2 (two) times daily.  05/29/18   Ann Held, DO  Multiple Vitamin (MULTIVITAMIN WITH MINERALS) TABS tablet Take 1 tablet by mouth daily.     [provider]  nitroGLYCERIN (NITROSTAT) 0.4 MG SL tablet Place 1 tablet (0.4 mg total) under the tongue every 5 (five) minutes x 3 doses as needed for chest pain. 01/29/18   Rai, Vernelle Emerald, MD  PRESCRIPTION MEDICATION Take 1 tablet by mouth 3 (three) times daily with meals.    [provider]    Family History Family History  Problem Relation Age of Onset  . Kidney disease Mother   . Coronary artery disease Other   . Diabetes Other   . Hypertension Other   . Arthritis Other   . Kidney disease Sister     Social History Social History   Tobacco Use  . Smoking status: Never Smoker  . Smokeless tobacco: Never Used  . 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 Retired Consulting civil engineer  Substance Use Topics  . Alcohol use: No  . Drug use: No       Allergies   Hydralazine and Robaxin [methocarbamol]   Review of Systems Review of Systems  Constitutional: Negative for fever.  HENT: Negative for sore throat.   Eyes: Negative for visual disturbance.  Respiratory: Positive for cough (chronic greater than one year no change). Negative for shortness of breath.   Cardiovascular: Negative for chest pain.  Gastrointestinal: Negative for abdominal pain, nausea and vomiting.  Genitourinary: Negative for difficulty urinating.  Musculoskeletal: Negative for back pain and neck pain.  Skin: Negative for rash.  Neurological: Negative for syncope, light-headedness and headaches.     Physical Exam Updated Vital Signs BP (!) 149/55 (BP Location: Right Arm)   Pulse 65   Temp 98.1 F (36.7 C) (Oral)   Resp 15   Ht 5\' 5"  (1.651 m)   Wt 55.8 kg   SpO2 100%  BMI 20.47 kg/m   Physical Exam  Constitutional: She is oriented to person, place, and time. She appears well-developed and well-nourished. No distress.  HENT:  Head: Normocephalic and atraumatic.  Eyes: Conjunctivae and EOM are normal.  Neck: Normal range of motion.  Cardiovascular: Normal rate, regular rhythm, normal heart sounds and intact distal pulses. Exam reveals no gallop and no friction rub.  No murmur heard. Pulmonary/Chest: Effort normal and breath sounds normal. No respiratory distress. She has no wheezes. She has no rales.  Abdominal: Soft. She exhibits no distension. There is no tenderness. There is no guarding.  Musculoskeletal: She exhibits no edema or tenderness.  Left arm AV graft with thrill  Neurological: She is alert and oriented to person, place, and time.  Skin: Skin is warm and dry. No rash noted. She is not diaphoretic. No erythema.  Nursing note and vitals reviewed.    ED Treatments / Results  Labs (all labs ordered are listed, but only abnormal results are displayed) Labs Reviewed  CBC - Abnormal; Notable for the following components:       Result Value   WBC 2.8 (*)    RBC 3.33 (*)    Hemoglobin 9.5 (*)    HCT 32.9 (*)    MCHC 28.9 (*)    RDW 19.9 (*)    Platelets 65 (*)    All other components within normal limits  BASIC METABOLIC PANEL - Abnormal; Notable for the following components:   BUN 40 (*)    Creatinine, Ser 6.69 (*)    GFR calc non Af Amer 5 (*)    GFR calc Af Amer 6 (*)    All other components within normal limits  SAMPLE TO BLOOD BANK    EKG EKG Interpretation  Date/Time:  Wednesday September 05 2018 08:27:19 EST Ventricular Rate:  62 PR Interval:    QRS Duration: 141 QT Interval:  426 QTC Calculation: 433 R Axis:   40 Text Interpretation:  Sinus rhythm Atrial premature complex LVH with secondary repolarization abnormality No significant change since last tracing Confirmed by Gareth Morgan [phone]) on 09/05/2018 8:30:41 AM   Radiology No results found.  Procedures Procedures (including critical care time)  Medications Ordered in ED Medications - No data to display   Initial Impression / Assessment and Plan / ED Course  I have reviewed the triage vital signs and the nursing notes.  Pertinent labs & imaging results that were available during my care of the patient were reviewed by me and considered in my medical decision making (see chart for details).     77 year old female with a history below presents with wanting to continue work-up that was initiated the night before last when she presented with AV graft bleeding. Dr. Oneida Alar had planned an observation stay, with recheck blood levels and shuntogram, however patient had left AGAINST MEDICAL ADVICE.  Labs rechecked today showing hemoglobin 9.5  Consulted vascular surgery, Dr. Donnetta Hutching regarding prior plan for shuntogram and discussed with patient.  Plan on shuntogram for tomorrow AM. Gave patient preop information and instructions. Recommend she attend her regularly scheduled dialysis today and be NPO at midnight. Patient discharged in  stable condition with understanding of reasons to return.   Final Clinical Impressions(s) / ED Diagnoses   Final diagnoses:  Malfunction of arteriovenous graft, subsequent encounter    ED Discharge Orders    None       Gareth Morgan, MD 09/05/18 2145

## 2018-09-06 ENCOUNTER — Ambulatory Visit (HOSPITAL_COMMUNITY)
Admission: RE | Admit: 2018-09-06 | Discharge: 2018-09-06 | Disposition: A | Discharge status: Home / Self Care | Payer: Medicare HMO | Source: Ambulatory Visit | Attending: Vascular Surgery | Admitting: Vascular Surgery

## 2018-09-06 ENCOUNTER — Encounter (HOSPITAL_COMMUNITY): Payer: Self-pay | Admitting: Vascular Surgery

## 2018-09-06 ENCOUNTER — Encounter (HOSPITAL_COMMUNITY)
Admission: RE | Disposition: A | Discharge status: Home / Self Care | Payer: Self-pay | Source: Ambulatory Visit | Attending: Vascular Surgery

## 2018-09-06 DIAGNOSIS — B181 Chronic viral hepatitis B without delta-agent: Secondary | ICD-10-CM | POA: Diagnosis not present

## 2018-09-06 DIAGNOSIS — B1920 Unspecified viral hepatitis C without hepatic coma: Secondary | ICD-10-CM | POA: Diagnosis not present

## 2018-09-06 DIAGNOSIS — T82898A Other specified complication of vascular prosthetic devices, implants and grafts, initial encounter: Secondary | ICD-10-CM | POA: Diagnosis not present

## 2018-09-06 DIAGNOSIS — E785 Hyperlipidemia, unspecified: Secondary | ICD-10-CM | POA: Diagnosis not present

## 2018-09-06 DIAGNOSIS — Z992 Dependence on renal dialysis: Secondary | ICD-10-CM

## 2018-09-06 DIAGNOSIS — K219 Gastro-esophageal reflux disease without esophagitis: Secondary | ICD-10-CM | POA: Diagnosis not present

## 2018-09-06 DIAGNOSIS — T82858A Stenosis of vascular prosthetic devices, implants and grafts, initial encounter: Secondary | ICD-10-CM | POA: Diagnosis not present

## 2018-09-06 DIAGNOSIS — N186 End stage renal disease: Secondary | ICD-10-CM | POA: Diagnosis not present

## 2018-09-06 DIAGNOSIS — Z8249 Family history of ischemic heart disease and other diseases of the circulatory system: Secondary | ICD-10-CM | POA: Diagnosis not present

## 2018-09-06 DIAGNOSIS — M8580 Other specified disorders of bone density and structure, unspecified site: Secondary | ICD-10-CM | POA: Diagnosis not present

## 2018-09-06 DIAGNOSIS — Y832 Surgical operation with anastomosis, bypass or graft as the cause of abnormal reaction of the patient, or of later complication, without mention of misadventure at the time of the procedure: Secondary | ICD-10-CM | POA: Insufficient documentation

## 2018-09-06 DIAGNOSIS — F329 Major depressive disorder, single episode, unspecified: Secondary | ICD-10-CM | POA: Diagnosis not present

## 2018-09-06 DIAGNOSIS — E1022 Type 1 diabetes mellitus with diabetic chronic kidney disease: Secondary | ICD-10-CM | POA: Diagnosis not present

## 2018-09-06 DIAGNOSIS — T82838A Hemorrhage of vascular prosthetic devices, implants and grafts, initial encounter: Secondary | ICD-10-CM | POA: Insufficient documentation

## 2018-09-06 DIAGNOSIS — I120 Hypertensive chronic kidney disease with stage 5 chronic kidney disease or end stage renal disease: Secondary | ICD-10-CM | POA: Diagnosis not present

## 2018-09-06 HISTORY — PX: A/V SHUNTOGRAM: CATH118297

## 2018-09-06 HISTORY — PX: PERIPHERAL VASCULAR BALLOON ANGIOPLASTY: CATH118281

## 2018-09-06 LAB — POCT I-STAT, CHEM 8
BUN: 26 mg/dL — AB (ref 8–23)
CREATININE: 4.9 mg/dL — AB (ref 0.44–1.00)
Calcium, Ion: 1.17 mmol/L (ref 1.15–1.40)
Chloride: 99 mmol/L (ref 98–111)
Glucose, Bld: 75 mg/dL (ref 70–99)
HEMATOCRIT: 33 % — AB (ref 36.0–46.0)
Hemoglobin: 11.2 g/dL — ABNORMAL LOW (ref 12.0–15.0)
Potassium: 3.8 mmol/L (ref 3.5–5.1)
Sodium: 138 mmol/L (ref 135–145)
TCO2: 29 mmol/L (ref 22–32)

## 2018-09-06 SURGERY — A/V SHUNTOGRAM
Anesthesia: LOCAL

## 2018-09-06 MED ORDER — LABETALOL HCL 5 MG/ML IV SOLN: 10.0000 mg | INTRAVENOUS | Status: DC | PRN

## 2018-09-06 MED ORDER — HEPARIN SODIUM (PORCINE) 1000 UNIT/ML IJ SOLN
INTRAMUSCULAR | Status: DC | PRN
  Administered 2018-09-06: 3000 [IU] via INTRAVENOUS

## 2018-09-06 MED ORDER — SODIUM CHLORIDE 0.9% FLUSH: 3.0000 mL | INTRAVENOUS | Status: DC | PRN

## 2018-09-06 MED ORDER — HEPARIN (PORCINE) IN NACL 1000-0.9 UT/500ML-% IV SOLN
INTRAVENOUS | Status: DC | PRN
  Administered 2018-09-06: 500 mL

## 2018-09-06 MED ORDER — HEPARIN (PORCINE) IN NACL 1000-0.9 UT/500ML-% IV SOLN
INTRAVENOUS | Status: AC
  Filled 2018-09-06: qty 500

## 2018-09-06 MED ORDER — LIDOCAINE HCL (PF) 1 % IJ SOLN
INTRAMUSCULAR | Status: AC
  Filled 2018-09-06: qty 30

## 2018-09-06 MED ORDER — LIDOCAINE HCL (PF) 1 % IJ SOLN
INTRAMUSCULAR | Status: DC | PRN
  Administered 2018-09-06: 2 mL

## 2018-09-06 MED ORDER — IODIXANOL 320 MG/ML IV SOLN
INTRAVENOUS | Status: DC | PRN
  Administered 2018-09-06: 35 mL via INTRAVENOUS

## 2018-09-06 SURGICAL SUPPLY — 16 items
BAG SNAP BAND KOVER 36X36 (MISCELLANEOUS) ×3 IMPLANT
BALLN LUTONIX AV 6X60X75 (BALLOONS) ×3
BALLN MUSTANG 8.0X40 75 (BALLOONS) ×3
BALLOON LUTONIX AV 6X60X75 (BALLOONS) ×2 IMPLANT
BALLOON MUSTANG 8.0X40 75 (BALLOONS) ×2 IMPLANT
COVER DOME SNAP 22 D (MISCELLANEOUS) ×3 IMPLANT
KIT ENCORE 26 ADVANTAGE (KITS) ×3 IMPLANT
KIT MICROPUNCTURE NIT STIFF (SHEATH) ×3 IMPLANT
PROTECTION STATION PRESSURIZED (MISCELLANEOUS) ×3
SHEATH PINNACLE R/O II 5F 6CM (SHEATH) ×3 IMPLANT
SHEATH PROBE COVER 6X72 (BAG) ×3 IMPLANT
STATION PROTECTION PRESSURIZED (MISCELLANEOUS) ×2 IMPLANT
STOPCOCK MORSE 400PSI 3WAY (MISCELLANEOUS) ×3 IMPLANT
TRAY PV CATH (CUSTOM PROCEDURE TRAY) ×3 IMPLANT
TUBING CIL FLEX 10 FLL-RA (TUBING) ×3 IMPLANT
WIRE BENTSON .035X145CM (WIRE) ×3 IMPLANT

## 2018-09-06 NOTE — H&P (Signed)
History and Physical Interval Note:  09/06/2018 7:42 AM  [name] [name]  has presented today for surgery, with the diagnosis of complication w/ graft  The various methods of treatment have been discussed with the patient and family. After consideration of risks, benefits and other options for treatment, the patient has consented to  Procedure(s): A/V SHUNTOGRAM - Left AV (N/A) as a surgical intervention .  The patient's history has been reviewed, patient examined, no change in status, stable for surgery.  I have reviewed the patient's chart and labs.  Questions were answered to the patient's satisfaction.    Left arm fistulogram.   Marty Heck  Patient name: [name] [name]        MRN: [id number]        DOB: 1941-09-26        Sex: female   HPI: [name] [name] is a 77 y.o. female, with bleeding from left arm AV graft during dialysis today.  Bleeding stopped with direct pressure in ER.  HD day was today.  Graft was placed in 2015.      Past Medical History:  Diagnosis Date  . DEPRESSION   . DIABETES MELLITUS, TYPE I   . GERD   . GLAUCOMA   . Headache(784.0)   . Hepatitis B carrier ([place])    05/2009: neg Hep C; Hep B: core pos, Surf neg; fatty liver US 8/10 - 7/13  . HYPERLIPIDEMIA   . HYPERTENSION   . Kidney failure    dialysis 3 times per week  . OSTEOPENIA   . POSTMENOPAUSAL STATUS   . Pulmonary edema   . Unspecified vitamin D deficiency         Past Surgical History:  Procedure Laterality Date  . AV FISTULA PLACEMENT Left 05/29/2014   Procedure: INSERTION OF ARTERIOVENOUS (AV) GORE-TEX GRAFT ARM-LEFT;  Surgeon: Angelia Mould, MD;  Location: [facility];  Service: Vascular;  Laterality: Left;  . CHOLECYSTECTOMY  02/12/07  . INSERTION OF DIALYSIS CATHETER Right 05/27/2014   Procedure: INSERTION OF DIALYSIS CATHETER;  Surgeon: Angelia Mould, MD;  Location: [facility];  Service: Vascular;  Laterality: Right;  . REFRACTIVE SURGERY  07/29/09    Dr. Bing Plume  . TONSILLECTOMY           Family History  Problem Relation Age of Onset  . Kidney disease Mother   . Coronary artery disease Other   . Diabetes Other   . Hypertension Other   . Arthritis Other   . Kidney disease Sister     SOCIAL HISTORY: Social History        Socioeconomic History  . Marital status: Married    Spouse name: Not on file  . Number of children: Not on file  . Years of education: Not on file  . Highest education level: Not on file  Occupational History  . Not on file  Social Needs  . Financial resource strain: Not on file  . Food insecurity:    Worry: Not on file    Inability: Not on file  . Transportation needs:    Medical: Not on file    Non-medical: Not on file  Tobacco Use  . Smoking status: Never Smoker  . Smokeless tobacco: Never Used  . Tobacco comment: Married x's 41yrs, 6 kids-scattered [employer]. Retired Consulting civil engineer  Substance and Sexual Activity  . Alcohol use: No  . Drug use: No  . Sexual activity: Not Currently    Birth control/protection: None  Lifestyle  .  Physical activity:    Days per week: Not on file    Minutes per session: Not on file  . Stress: Not on file  Relationships  . Social connections:    Talks on phone: Not on file    Gets together: Not on file    Attends religious service: Not on file    Active member of club or organization: Not on file    Attends meetings of clubs or organizations: Not on file    Relationship status: Not on file  . Intimate partner violence:    Fear of current or ex partner: Not on file    Emotionally abused: Not on file    Physically abused: Not on file    Forced sexual activity: Not on file  Other Topics Concern  . Not on file  Social History Narrative  . Not on file         Allergies  Allergen Reactions  . Hydralazine Itching  . Robaxin [Methocarbamol]     Dizziness     No current facility-administered  medications for this encounter.          Current Outpatient Medications  Medication Sig Dispense Refill  . calcitRIOL (ROCALTROL) 0.5 MCG capsule Take 3 capsules (1.5 mcg total) by mouth 3 (three) times a week. 15 capsule 0  . carvedilol (COREG) 25 MG tablet TAKE 1 TABLET (25 MG TOTAL) BY MOUTH 2 (TWO) TIMES DAILY WITH A MEAL. 180 tablet 1  . cefUROXime (CEFTIN) 250 MG tablet Take 1 tablet (250 mg total) by mouth 2 (two) times daily with a meal. 10 tablet 0  . ferrous sulfate 325 (65 FE) MG tablet Take 325-650 mg by mouth daily with breakfast.    . isosorbide mononitrate (IMDUR) 30 MG 24 hr tablet TAKE 1 TABLET BY MOUTH TWICE A DAY 180 tablet 1  . Multiple Vitamin (MULTIVITAMIN WITH MINERALS) TABS tablet Take 1 tablet by mouth daily.     . nitroGLYCERIN (NITROSTAT) 0.4 MG SL tablet Place 1 tablet (0.4 mg total) under the tongue every 5 (five) minutes x 3 doses as needed for chest pain. 30 tablet 12    ROS:   General:  No weight loss, Fever, chills  HEENT: No recent headaches, no nasal bleeding, no visual changes, no sore throat  Neurologic: No dizziness, blackouts, seizures. No recent symptoms of stroke or mini- stroke. No recent episodes of slurred speech, or temporary blindness.  Cardiac: No recent episodes of chest pain/pressure, no shortness of breath at rest.  No shortness of breath with exertion.  Denies history of atrial fibrillation or irregular heartbeat  Vascular: No history of rest pain in feet.  No history of claudication.  No history of non-healing ulcer, No history of DVT   Pulmonary: No home oxygen, no productive cough, no hemoptysis,  No asthma or wheezing  Musculoskeletal:  [ ]  Arthritis, [ ]  Low back pain,  [ ]  Joint pain  Hematologic:No history of hypercoagulable state.  No history of easy bleeding.  No history of anemia  Gastrointestinal: No hematochezia or melena,  No gastroesophageal reflux, no trouble swallowing  Urinary: [X]  chronic Kidney  disease, [X]  on HD - [X]  MWF or [ ]  TTHS, [ ]  Burning with urination, [ ]  Frequent urination, [ ]  Difficulty urinating;   Skin: No rashes  Psychological: No history of anxiety,  No history of depression   Physical Examination        Vitals:   09/03/18 1620 09/03/18 1627 09/03/18 1853 09/03/18  2008  BP:  (!) 148/67 (!) 140/98 95/80  Pulse:  67 75 66  Resp:  18 14 16   Temp:  98.5 F (36.9 C) 98.8 F (37.1 C) 98.8 F (37.1 C)  TempSrc:  Oral Oral Oral  SpO2: 98% 98% 100% 100%    There is no height or weight on file to calculate BMI.  General:  Alert and oriented, no acute distress HEENT: Normal Neck: No JVD Pulmonary: Clear to auscultation bilaterally Cardiac: Regular Rate and Rhythm  Skin: No rash Extremity Pulses:  Pulsatile left upper arm AV graft 1 needle still in place removed hemostasis with 10 min direct pressure Musculoskeletal: No deformity or edema      Neurologic: Upper and lower extremity motor 5/5 and symmetric  DATA:  CBC Labs(Brief)          Component Value Date/Time   WBC 2.7 (L) 08/21/2018 1151   RBC 3.83 (L) 08/21/2018 1151   HGB 11.4 (L) 08/21/2018 1151   HCT 35.8 (L) 08/21/2018 1151   PLT 80.0 (L) 08/21/2018 1151   MCV 93.6 08/21/2018 1151   MCH 31.3 03/30/2018 1307   MCHC 31.7 08/21/2018 1151   RDW 22.4 (H) 08/21/2018 1151   LYMPHSABS 0.4 (L) 08/21/2018 1151   MONOABS 0.3 08/21/2018 1151   EOSABS 0.1 08/21/2018 1151   BASOSABS 0.0 08/21/2018 1151      BMET Labs(Brief)          Component Value Date/Time   NA 142 08/21/2018 1151   K 4.4 08/21/2018 1151   CL 100 08/21/2018 1151   CO2 34 (H) 08/21/2018 1151   GLUCOSE 79 08/21/2018 1151   GLUCOSE 70 10/10/2006 1540   BUN 19 08/21/2018 1151   CREATININE 4.86 (HH) 08/21/2018 1151   CALCIUM 9.8 08/21/2018 1151   GFRNONAA 13 (L) 03/30/2018 1307   GFRAA 15 (L) 03/30/2018 1307       ASSESSMENT: Bleeding AV graft now stopped with pressure.   Most likely venous outflow stenosis.   PLAN:  Will admit for obs with plan to do shuntogram tomorrow Npo p midnight. Consent Check AM labs Pancytopenia with decreased platelets may be contributing.   Ruta Hinds, MD [facility] Office: [phone] Pager: [phone]

## 2018-09-06 NOTE — Op Note (Signed)
    OPERATIVE NOTE   PROCEDURE: 1. left upper extremity arteriovenous graft cannulation under ultrasound guidance 2. left arm fistulogram including central venogram 3. Angioplasty of left upper arm graft (6 mm x 60 mm Lutonix drug coated balloon) 4. Angioplasty of left subclavian vein (8 mm x 40 mm Mustang)  PRE-OPERATIVE DIAGNOSIS: Malfunctioning left arteriovenous graft (prolonged bleeding)  POST-OPERATIVE DIAGNOSIS: same as above   SURGEON: Marty Heck, MD  ANESTHESIA: local  ESTIMATED BLOOD LOSS: 5 cc  FINDING(S): 1. 60% stenosis in left upper arm graft just distal to existing stent in left arm graft.  Separate 50% stenosis of left subclavian vein.  Brisk outflow of graft with good thrill after intervention.   SPECIMEN(S):  None  CONTRAST: 35 cc  INDICATIONS: [name] [name] is a 77 y.o. female who  presents with malfunctioning left arm arteriovenous upper arm graft.  The patient is scheduled for left arm fistulogram.  The patient is aware the risks include but are not limited to: bleeding, infection, thrombosis of the cannulated access, and possible anaphylactic reaction to the contrast.  The patient is aware of the risks of the procedure and elects to proceed forward.  DESCRIPTION: After full informed written consent was obtained, the patient was brought back to the angiography suite and placed supine upon the angiography table.  The patient was connected to monitoring equipment.  The left arm was prepped and draped in the standard fashion for a left arm fistulogram.  Under ultrasound guidance, the left arteriovenous graft was cannulated with a micropuncture needle.  An [place] image was permanently recorded.  The microwire was advanced into the fistula and the needle was exchanged for the a microsheath, which was lodged 2 cm into the access.  The wire was removed and the sheath was connected to the IV extension tubing.  Hand injections were completed to image the access  from the antecubitum up to the level of axilla.  The central venous structures were also imaged by hand injections.  Overall there was a 50% stenosis in left upper arm graft just distal to existing stent and another 50% stenosis in left subclavian vein.  I elected to treat these given her prolonged bleeding issues with dialysis.  I used a Careers adviser and exchanged for a short 6 Fr sheath.  I then selected a 6 mm x 60 mm drug-coated Lutonix balloon and angioplastied the stenosis in the upper arm graft just distal to her existing stent that was 60%.  Another hand injected arteriogram showed no residual stenosis.  Contrast emptied briskly throughout the graft in the upper arm into the central structures.  I then separately elected to treat the subclavian stenosis that was again only 50% given a small collateral around this I thought it may be worth treating given her prolonged bleeding issues.  This was treated with an 8 mm x 40 mm Mustang.  Final injection through the sheath showed brisk emptying and the patient had a great thrill.  The sheath was removed and a 4-0 Monocryl pursestring was placed.  Additional pressure was held.  Taken to PACU in stable condition.  COMPLICATIONS: None  CONDITION: Stable  Marty Heck, MD [facility] Office: [phone] Pager: [phone]  09/06/2018 8:37 AM  Marty Heck

## 2018-09-06 NOTE — Discharge Instructions (Signed)
Venogram, Care After °This sheet gives you information about how to care for yourself after your procedure. Your health care provider may also give you more specific instructions. If you have problems or questions, contact your health care provider. °What can I expect after the procedure? °After the procedure, it is common to have: °· Bruising or mild discomfort in the area where the IV was inserted (insertion site). ° °Follow these instructions at home: °Eating and drinking °· Follow instructions from your health care provider about eating or drinking restrictions. °· Drink a lot of fluids for the first several days after the procedure, as directed by your health care provider. This helps to wash (flush) the contrast out of your body. Examples of healthy fluids include water or low-calorie drinks. °General instructions °· Check your IV insertion area every day for signs of infection. Check for: °? Redness, swelling, or pain. °? Fluid or blood. °? Warmth. °? Pus or a bad smell. °· Take over-the-counter and prescription medicines only as told by your health care provider. °· Rest and return to your normal activities as told by your health care provider. Ask your health care provider what activities are safe for you. °· Do not drive for 24 hours if you were given a medicine to help you relax (sedative), or until your health care provider approves. °· Keep all follow-up visits as told by your health care provider. This is important. °Contact a health care provider if: °· Your skin becomes itchy or you develop a rash or hives. °· You have a fever that does not get better with medicine. °· You feel nauseous. °· You vomit. °· You have redness, swelling, or pain around the insertion site. °· You have fluid or blood coming from the insertion site. °· Your insertion area feels warm to the touch. °· You have pus or a bad smell coming from the insertion site. °Get help right away if: °· You have difficulty breathing or  shortness of breath. °· You develop chest pain. °· You faint. °· You feel very dizzy. °These symptoms may represent a serious problem that is an emergency. Do not wait to see if the symptoms will go away. Get medical help right away. Call your local emergency services (911 in the [place] Do not drive yourself to the hospital. °Summary °· After your procedure, it is common to have bruising or mild discomfort in the area where the IV was inserted. °· You should check your IV insertion area every day for signs of infection. °· Take over-the-counter and prescription medicines only as told by your health care provider. °· You should drink a lot of fluids for the first several days after the procedure to help flush the contrast from your body. °This information is not intended to replace advice given to you by your health care provider. Make sure you discuss any questions you have with your health care provider. °Document Released: 08/07/2013 Document Revised: 09/10/2016 Document Reviewed: 09/10/2016 °Elsevier Interactive Patient Education © 2017 [employer] ° °

## 2018-09-07 DIAGNOSIS — N186 End stage renal disease: Secondary | ICD-10-CM | POA: Diagnosis not present

## 2018-09-07 DIAGNOSIS — N2581 Secondary hyperparathyroidism of renal origin: Secondary | ICD-10-CM | POA: Diagnosis not present

## 2018-09-12 DIAGNOSIS — N2581 Secondary hyperparathyroidism of renal origin: Secondary | ICD-10-CM | POA: Diagnosis not present

## 2018-09-12 DIAGNOSIS — N186 End stage renal disease: Secondary | ICD-10-CM | POA: Diagnosis not present

## 2018-09-14 DIAGNOSIS — N186 End stage renal disease: Secondary | ICD-10-CM | POA: Diagnosis not present

## 2018-09-14 DIAGNOSIS — N2581 Secondary hyperparathyroidism of renal origin: Secondary | ICD-10-CM | POA: Diagnosis not present

## 2018-09-17 DIAGNOSIS — N186 End stage renal disease: Secondary | ICD-10-CM | POA: Diagnosis not present

## 2018-09-17 DIAGNOSIS — N2581 Secondary hyperparathyroidism of renal origin: Secondary | ICD-10-CM | POA: Diagnosis not present

## 2018-09-19 ENCOUNTER — Emergency Department (HOSPITAL_COMMUNITY): Payer: Medicare HMO

## 2018-09-19 ENCOUNTER — Other Ambulatory Visit: Payer: Self-pay

## 2018-09-19 ENCOUNTER — Emergency Department (HOSPITAL_COMMUNITY)
Admission: EM | Admit: 2018-09-19 | Discharge: 2018-09-19 | Disposition: A | Discharge status: Home / Self Care | Payer: Medicare HMO | Attending: Emergency Medicine | Admitting: Emergency Medicine

## 2018-09-19 DIAGNOSIS — Z79899 Other long term (current) drug therapy: Secondary | ICD-10-CM | POA: Diagnosis not present

## 2018-09-19 DIAGNOSIS — E1122 Type 2 diabetes mellitus with diabetic chronic kidney disease: Secondary | ICD-10-CM | POA: Diagnosis not present

## 2018-09-19 DIAGNOSIS — R531 Weakness: Secondary | ICD-10-CM | POA: Diagnosis present

## 2018-09-19 DIAGNOSIS — R918 Other nonspecific abnormal finding of lung field: Secondary | ICD-10-CM | POA: Diagnosis not present

## 2018-09-19 DIAGNOSIS — E860 Dehydration: Secondary | ICD-10-CM | POA: Diagnosis not present

## 2018-09-19 DIAGNOSIS — I5032 Chronic diastolic (congestive) heart failure: Secondary | ICD-10-CM | POA: Insufficient documentation

## 2018-09-19 DIAGNOSIS — N186 End stage renal disease: Secondary | ICD-10-CM | POA: Insufficient documentation

## 2018-09-19 DIAGNOSIS — R197 Diarrhea, unspecified: Secondary | ICD-10-CM

## 2018-09-19 DIAGNOSIS — R079 Chest pain, unspecified: Secondary | ICD-10-CM | POA: Insufficient documentation

## 2018-09-19 DIAGNOSIS — R7989 Other specified abnormal findings of blood chemistry: Secondary | ICD-10-CM

## 2018-09-19 DIAGNOSIS — R42 Dizziness and giddiness: Secondary | ICD-10-CM | POA: Diagnosis not present

## 2018-09-19 DIAGNOSIS — I132 Hypertensive heart and chronic kidney disease with heart failure and with stage 5 chronic kidney disease, or end stage renal disease: Secondary | ICD-10-CM | POA: Diagnosis not present

## 2018-09-19 DIAGNOSIS — R778 Other specified abnormalities of plasma proteins: Secondary | ICD-10-CM

## 2018-09-19 DIAGNOSIS — R5383 Other fatigue: Secondary | ICD-10-CM | POA: Diagnosis not present

## 2018-09-19 LAB — COMPREHENSIVE METABOLIC PANEL
ALBUMIN: 3 g/dL — AB (ref 3.5–5.0)
ALT: 25 U/L (ref 0–44)
AST: 36 U/L (ref 15–41)
Alkaline Phosphatase: 125 U/L (ref 38–126)
Anion gap: 12 (ref 5–15)
BUN: 54 mg/dL — AB (ref 8–23)
CHLORIDE: 103 mmol/L (ref 98–111)
CO2: 26 mmol/L (ref 22–32)
Calcium: 10.5 mg/dL — ABNORMAL HIGH (ref 8.9–10.3)
Creatinine, Ser: 6.42 mg/dL — ABNORMAL HIGH (ref 0.44–1.00)
GFR calc Af Amer: 7 mL/min — ABNORMAL LOW (ref >60)
GFR calc non Af Amer: 6 mL/min — ABNORMAL LOW (ref >60)
Glucose, Bld: 101 mg/dL — ABNORMAL HIGH (ref 70–99)
POTASSIUM: 3.8 mmol/L (ref 3.5–5.1)
Sodium: 141 mmol/L (ref 135–145)
Total Bilirubin: 0.7 mg/dL (ref 0.3–1.2)
Total Protein: 7 g/dL (ref 6.5–8.1)

## 2018-09-19 LAB — CBC WITH DIFFERENTIAL/PLATELET
Abs Immature Granulocytes: 0 10*3/uL (ref 0.00–0.07)
BASOS ABS: 0 10*3/uL (ref 0.0–0.1)
Basophils Relative: 1 %
EOS ABS: 0.1 10*3/uL (ref 0.0–0.5)
Eosinophils Relative: 2 %
HEMATOCRIT: 33.7 % — AB (ref 36.0–46.0)
Hemoglobin: 10 g/dL — ABNORMAL LOW (ref 12.0–15.0)
IMMATURE GRANULOCYTES: 0 %
Lymphocytes Relative: 16 %
Lymphs Abs: 0.5 10*3/uL — ABNORMAL LOW (ref 0.7–4.0)
MCH: 29.5 pg (ref 26.0–34.0)
MCHC: 29.7 g/dL — ABNORMAL LOW (ref 30.0–36.0)
MCV: 99.4 fL (ref 80.0–100.0)
Monocytes Absolute: 0.3 10*3/uL (ref 0.1–1.0)
Monocytes Relative: 11 %
NEUTROS PCT: 70 %
NRBC: 0 % (ref 0.0–0.2)
Neutro Abs: 2.1 10*3/uL (ref 1.7–7.7)
PLATELETS: 66 10*3/uL — AB (ref 150–400)
RBC: 3.39 MIL/uL — ABNORMAL LOW (ref 3.87–5.11)
RDW: 19.9 % — AB (ref 11.5–15.5)
WBC: 3 10*3/uL — ABNORMAL LOW (ref 4.0–10.5)

## 2018-09-19 LAB — I-STAT CG4 LACTIC ACID, ED: Lactic Acid, Venous: 1.06 mmol/L (ref 0.5–1.9)

## 2018-09-19 LAB — LIPASE, BLOOD: LIPASE: 54 U/L — AB (ref 11–51)

## 2018-09-19 LAB — I-STAT TROPONIN, ED
TROPONIN I, POC: 0.13 ng/mL — AB (ref 0.00–0.08)
TROPONIN I, POC: 0.14 ng/mL — AB (ref 0.00–0.08)

## 2018-09-19 LAB — TSH: TSH: 1.285 u[IU]/mL (ref 0.350–4.500)

## 2018-09-19 LAB — MAGNESIUM: Magnesium: 2 mg/dL (ref 1.7–2.4)

## 2018-09-19 LAB — TYPE AND SCREEN
ABO/RH(D): B POS
Antibody Screen: NEGATIVE

## 2018-09-19 MED ORDER — SODIUM CHLORIDE 0.9 % IV BOLUS
500.0000 mL | Freq: Once | INTRAVENOUS | Status: AC
  Administered 2018-09-19: 500 mL via INTRAVENOUS

## 2018-09-19 NOTE — Discharge Instructions (Signed)
Your work-up today showed elevated cardiac enzymes.  In the setting of the lightheadedness and dehydration we discussed admission however after our shared decision-making conversation, you requested discharge.  After rehydration you were feeling better.  You were not hypotensive.  Your imaging did not show evidence of new infection in your lungs or other new abnormality.  You have scar tissue in your lungs which we discussed.  Please follow-up with your primary doctor and continue your outpatient dialysis plan.  If any symptoms change or worsen, please return to the nearest emergency department.

## 2018-09-19 NOTE — ED Notes (Signed)
Patient verbalizes understanding of discharge instructions. Opportunity for questioning and answers were provided. 

## 2018-09-19 NOTE — ED Provider Notes (Signed)
[facility] EMERGENCY DEPARTMENT Provider Note   CSN: [id number] Arrival date & time: 09/19/18  1126     History   Chief Complaint Chief Complaint  Patient presents with  . Weakness    HPI [name] is a 77 y.o. female.  The history is provided by the patient and medical records. No language interpreter was used.  Illness  This is a new problem. The current episode started more than 2 days ago. The problem occurs constantly. The problem has been gradually worsening. Associated symptoms include chest pain (yesterday). Pertinent negatives include no abdominal pain, no headaches and no shortness of breath. Nothing aggravates the symptoms. Nothing relieves the symptoms. She has tried nothing for the symptoms. The treatment provided no relief.    Past Medical History:  Diagnosis Date  . DEPRESSION   . DIABETES MELLITUS, TYPE I   . GERD   . GLAUCOMA   . Headache(784.0)   . Hepatitis B carrier ([place])    05/2009: neg Hep C; Hep B: core pos, Surf neg; fatty liver US 8/10 - 7/13  . HYPERLIPIDEMIA   . HYPERTENSION   . Kidney failure    dialysis 3 times per week  . OSTEOPENIA   . POSTMENOPAUSAL STATUS   . Pulmonary edema   . Unspecified vitamin D deficiency     Patient Active Problem List   Diagnosis Date Noted  . AV graft malfunction (HCC) 09/03/2018  . Urinary tract infection without hematuria 08/21/2018  . Memory loss 08/21/2018  . Anemia, chronic disease 03/01/2018  . Vasovagal syncope 03/01/2018  . Macrocytic anemia 01/28/2018  . Orthostatic syncope 01/28/2018  . EKG, abnormal   . Acute metabolic encephalopathy 56/97/9480  . Influenza 11/16/2017  . Fever in adult 11/15/2017  . Demand ischemia ([place]) 11/10/2017  . Normocytic anemia 11/10/2017  . Aortic atherosclerosis ([place]) 11/10/2017  . Respiratory failure with hypoxia ([place]) 08/18/2017  . Volume overload 08/18/2017  . Dialysis patient, noncompliant ([place]) 08/17/2017  . Thumb pain, left  12/22/2016  . Chronic diastolic heart failure ([place]) 11/25/2016  . ESRD (end stage renal disease) on dialysis ([place]) 06/25/2014  . Malignant hypertension 05/17/2014  . Chest pain 05/17/2014  . Elevated troponin 05/17/2014  . Hypertensive emergency 05/17/2014  . NSTEMI (non-ST elevated myocardial infarction) ([place]) 05/17/2014  . Chronic kidney disease, stage IV (severe) ([place]) 12/05/2013  . GERD 06/17/2009  . Hepatitis B carrier ([place]) 06/17/2009  . DEPRESSION 05/20/2009  . Unspecified glaucoma 05/20/2009  . HEADACHE 05/20/2009  . CARDIAC MURMUR 05/20/2009  . OSTEOPENIA 02/15/2008  . POSTMENOPAUSAL STATUS 12/21/2007  . Hyperlipidemia LDL goal <70 09/21/2007  . Essential hypertension 09/13/2007  . Type II diabetes mellitus with nephropathy [facility])     Past Surgical History:  Procedure Laterality Date  . A/V SHUNTOGRAM N/A 09/06/2018   Procedure: A/V SHUNTOGRAM - Left AV;  Surgeon: Marty Heck, MD;  Location: [facility];  Service: Cardiovascular;  Laterality: N/A;  . AV FISTULA PLACEMENT Left 05/29/2014   Procedure: INSERTION OF ARTERIOVENOUS (AV) GORE-TEX GRAFT ARM-LEFT;  Surgeon: Angelia Mould, MD;  Location: [facility];  Service: Vascular;  Laterality: Left;  . CHOLECYSTECTOMY  02/12/07  . INSERTION OF DIALYSIS CATHETER Right 05/27/2014   Procedure: INSERTION OF DIALYSIS CATHETER;  Surgeon: Angelia Mould, MD;  Location: [facility];  Service: Vascular;  Laterality: Right;  . PERIPHERAL VASCULAR BALLOON ANGIOPLASTY Left 09/06/2018   Procedure: PERIPHERAL VASCULAR BALLOON ANGIOPLASTY;  Surgeon: Marty Heck, MD;  Location: [facility]  INVASIVE CV LAB;  Service: Cardiovascular;  Laterality: Left;  arm shunt  . REFRACTIVE SURGERY  07/29/09   Dr. Bing Plume  . TONSILLECTOMY       OB History   None      Home Medications    Prior to Admission medications   Medication Sig Start Date End Date Taking? Authorizing Provider  Acetaminophen (TYLENOL PO) Take 1 tablet by mouth  daily as needed (pain/headache).    [provider]  amLODipine (NORVASC) 10 MG tablet Take 10 mg by mouth daily.    [provider]  B Complex-C-Zn-Folic Acid (DIALYVITE/ZINC) TABS Take 1 tablet by mouth daily.    [provider]  calcitRIOL (ROCALTROL) 0.5 MCG capsule Take 3 capsules (1.5 mcg total) by mouth 3 (three) times a week. 11/13/17   Jani Gravel, MD  carvedilol (COREG) 25 MG tablet TAKE 1 TABLET (25 MG TOTAL) BY MOUTH 2 (TWO) TIMES DAILY WITH A MEAL. 08/24/18   Ann Held, DO  cefUROXime (CEFTIN) 250 MG tablet Take 1 tablet (250 mg total) by mouth 2 (two) times daily with a meal. 08/21/18   Carollee Herter, Alferd Apa, DO  ferrous sulfate 325 (65 FE) MG tablet Take 325-650 mg by mouth daily with breakfast.    [provider]  isosorbide mononitrate (IMDUR) 30 MG 24 hr tablet TAKE 1 TABLET BY MOUTH TWICE A DAY Patient taking differently: Take 30 mg by mouth 2 (two) times daily.  05/29/18   Ann Held, DO  Multiple Vitamin (MULTIVITAMIN WITH MINERALS) TABS tablet Take 1 tablet by mouth daily.     [provider]  nitroGLYCERIN (NITROSTAT) 0.4 MG SL tablet Place 1 tablet (0.4 mg total) under the tongue every 5 (five) minutes x 3 doses as needed for chest pain. 01/29/18   Rai, Vernelle Emerald, MD  PRESCRIPTION MEDICATION Take 1 tablet by mouth 3 (three) times daily with meals.    [provider]    Family History Family History  Problem Relation Age of Onset  . Kidney disease Mother   . Coronary artery disease Other   . Diabetes Other   . Hypertension Other   . Arthritis Other   . Kidney disease Sister     Social History Social History   Tobacco Use  . Smoking status: Never Smoker  . Smokeless tobacco: Never Used  . Tobacco comment: Married x's 82yrs, 6 kids-scattered [employer]. Retired Consulting civil engineer  Substance Use Topics  . Alcohol use: No  . Drug use: No     Allergies   Hydralazine and Robaxin  [methocarbamol]   Review of Systems Review of Systems  Constitutional: Positive for fatigue. Negative for chills, diaphoresis and fever.  HENT: Negative for congestion and rhinorrhea.   Eyes: Negative for visual disturbance.  Respiratory: Negative for cough, chest tightness, shortness of breath and wheezing.   Cardiovascular: Positive for chest pain (yesterday). Negative for palpitations and leg swelling.  Gastrointestinal: Positive for diarrhea. Negative for abdominal pain, constipation, nausea and vomiting.  Genitourinary: Negative for dysuria, flank pain and frequency.  Musculoskeletal: Negative for back pain, neck pain and neck stiffness.  Skin: Negative for rash and wound.  Neurological: Positive for light-headedness. Negative for dizziness, syncope, weakness, numbness and headaches.  Psychiatric/Behavioral: Negative for agitation.  All other systems reviewed and are negative.    Physical Exam Updated Vital Signs There were no vitals taken for this visit.  Physical Exam  Constitutional: She is oriented to person, place, and time. She  appears well-developed and well-nourished. No distress.  HENT:  Head: Normocephalic and atraumatic.  Nose: Nose normal.  Mouth/Throat: Oropharynx is clear and moist. No oropharyngeal exudate.  Eyes: Pupils are equal, round, and reactive to light. Conjunctivae and EOM are normal.  Neck: Normal range of motion. Neck supple.  Cardiovascular: Normal rate, regular rhythm and intact distal pulses.  Murmur heard. Intermittent bradycardia on telemetry  Pulmonary/Chest: Effort normal and breath sounds normal. No respiratory distress. She has no wheezes. She has no rales. She exhibits no tenderness.  Abdominal: Soft. There is no tenderness. There is no guarding.  Genitourinary:  Genitourinary Comments: Refused   Musculoskeletal: She exhibits no edema or tenderness.  Neurological: She is alert and oriented to person, place, and time. No sensory  deficit. She exhibits normal muscle tone.  Skin: Skin is warm and dry. Capillary refill takes less than 2 seconds. No rash noted. She is not diaphoretic. No erythema.  Psychiatric: She has a normal mood and affect.  Nursing note and vitals reviewed.    ED Treatments / Results  Labs (all labs ordered are listed, but only abnormal results are displayed) Labs Reviewed  CBC WITH DIFFERENTIAL/PLATELET - Abnormal; Notable for the following components:      Result Value   WBC 3.0 (*)    RBC 3.39 (*)    Hemoglobin 10.0 (*)    HCT 33.7 (*)    MCHC 29.7 (*)    RDW 19.9 (*)    Platelets 66 (*)    Lymphs Abs 0.5 (*)    All other components within normal limits  COMPREHENSIVE METABOLIC PANEL - Abnormal; Notable for the following components:   Glucose, Bld 101 (*)    BUN 54 (*)    Creatinine, Ser 6.42 (*)    Calcium 10.5 (*)    Albumin 3.0 (*)    GFR calc non Af Amer 6 (*)    GFR calc Af Amer 7 (*)    All other components within normal limits  LIPASE, BLOOD - Abnormal; Notable for the following components:   Lipase 54 (*)    All other components within normal limits  I-STAT TROPONIN, ED - Abnormal; Notable for the following components:   Troponin i, poc 0.13 (*)    All other components within normal limits  I-STAT TROPONIN, ED - Abnormal; Notable for the following components:   Troponin i, poc 0.14 (*)    All other components within normal limits  URINE CULTURE  TSH  MAGNESIUM  URINALYSIS, ROUTINE W REFLEX MICROSCOPIC  I-STAT CG4 LACTIC ACID, ED  I-STAT CG4 LACTIC ACID, ED  TYPE AND SCREEN    EKG EKG Interpretation  Date/Time:  Wednesday September 19 2018 12:02:04 EST Ventricular Rate:  71 PR Interval:    QRS Duration: 95 QT Interval:  350 QTC Calculation: 381 R Axis:   17 Text Interpretation:  Sinus rhythm Multiform ventricular premature complexes LVH with secondary repolarization abnormality When compared toprior, t wave now upright in leads 3, AVF, V4 and V5.  New PVC.   No STEMI Confirmed by Antony Blackbird [phone]) on 09/19/2018 12:09:52 PM   Radiology Dg Chest 2 View  Result Date: 09/19/2018 CLINICAL DATA:  Chest pain, near syncope, fatigue, dialysis patient EXAM: CHEST - 2 VIEW COMPARISON:  Chest x-ray of 03/30/2017 FINDINGS: No active infiltrate or effusion is seen. However there is nodular prominence of the right hilum which also appears somewhat prominent on the lateral view. This could represent hilar mass or adenopathy and CT of  the chest with IV contrast media would be helpful if possible in this renal failure patient. The heart is mildly enlarged and stable. No bony abnormality is seen. IMPRESSION: 1. Nodular prominence of the right hilum. Cannot exclude mass or adenopathy. Recommend CT of the chest with IV contrast media if possible in this dialysis patient. 2. Stable cardiomegaly. Electronically Signed   By: Ivar Drape M.D.   On: 09/19/2018 13:32   Ct Chest Wo Contrast  Result Date: 09/19/2018 CLINICAL DATA:  Prominent right hilum on chest radiograph. Fatigue. Malaise. End-stage renal disease on hemodialysis. EXAM: CT CHEST WITHOUT CONTRAST TECHNIQUE: Multidetector CT imaging of the chest was performed following the standard protocol without IV contrast. COMPARISON:  Chest radiograph from earlier today. FINDINGS: Cardiovascular: Mild cardiomegaly. No significant pericardial effusion/thickening. Left main and 3 vessel coronary atherosclerosis. Atherosclerotic nonaneurysmal thoracic aorta. Dense calcification of the proximal right brachiocephalic artery, probably representing chronic occlusion. Normal caliber pulmonary arteries. Mediastinum/Nodes: No discrete thyroid nodules. Unremarkable esophagus. No pathologically enlarged axillary, mediastinal or hilar lymph nodes, noting limited sensitivity for the detection of hilar adenopathy on this noncontrast study. Lungs/Pleura: No pneumothorax. No pleural effusion. No central airway stenoses. No acute consolidative  airspace disease, lung masses or significant pulmonary nodules. Nonspecific diffuse bronchial wall thickening. Bandlike subpleural consolidation at the medial right lung base with associated mild volume loss and distortion is stable since 11/07/2006 CT abdomen study, most compatible with postinfectious/postinflammatory scarring. Mild patchy subpleural reticulation and ground-glass attenuation at both lung bases without significant traction bronchiectasis, architectural distortion or frank honeycombing. These findings are only minimally increased since 2008 CT abdomen study at the lung bases. Upper abdomen: Cholecystectomy. Liver surface appears diffusely irregular, compatible with cirrhosis. Trace perihepatic ascites. Musculoskeletal: No aggressive appearing focal osseous lesions. Moderate thoracic spondylosis. IMPRESSION: 1. No pathologically enlarged thoracic nodes. Specifically, no convincing hilar adenopathy on this scan limited by non-contrast technique. 2. No acute pulmonary disease. 3. Mild patchy subpleural reticulation and ground-glass attenuation at the lung bases, only mildly increased since 2008 CT abdomen study. Findings probably reflect nonspecific postinfectious/postinflammatory scarring. The possibility of an indolent interstitial lung disease such as nonspecific interstitial pneumonia (NSIP) is not excluded. Usual interstitial pneumonia (UIP) is unlikely given minimal change over many years. A follow-up high-resolution chest CT study could be obtained in 12 months to assess ongoing temporal pattern stability if there is clinical concern for interstitial lung disease. 4. Hepatic cirrhosis.  Trace perihepatic ascites. 5. Mild cardiomegaly. Left main and 3 vessel coronary atherosclerosis. 6. 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 Aortic Atherosclerosis (ICD10-I70.0). Electronically Signed   By: Ilona Sorrel M.D.   On: 09/19/2018 15:31    Procedures Procedures (including  critical care time)  Medications Ordered in ED Medications  sodium chloride 0.9 % bolus 500 mL (500 mLs Intravenous New Bag/Given 09/19/18 1230)     Initial Impression / Assessment and Plan / ED Course  I have reviewed the triage vital signs and the nursing notes.  Pertinent labs & imaging results that were available during my care of the patient were reviewed by me and considered in my medical decision making (see chart for details).     [name] [name] is a 77 y.o. female with a past medical history significant for ESRD on dialysis MWF, hypertension, hyperlipidemia, osteopenia, hepatitis B, GERD, CHF, depression, and anemia who presents with severe fatigue, chest pain yesterday, lightheadedness with near syncope, dark tarry stools, and diarrhea.  Patient reports that her last dialysis was on  Monday and for the last several days she is been having severe fatigue.  She reports that she is so tired she is having generalized weakness.  She reports that she has had dark stools and had some diarrhea yesterday.  She refused a rectal exam or fecal occult test.  She reports no fevers or chills.  She denies any urinary symptoms with urine she does make.  She denies any nausea, vomiting, conservation, cough, congestion, or shortness of breath.  She reports no recent medication changes.  She reports that she is been eating drinking normally.  Patient is primarily concerned because she feels so weak and tired.    On exam, abdomen is nontender.  Back is nontender.  Chest is nontender.  Patient has a systolic murmur.  No CVA tenderness.  Patient is alert and oriented.  Vital signs showed a brief episode of bradycardia before patient was hooked up to full telemetry with a rate in the 30s.  We will keep her on telemetry monitoring to monitor for further episodes of bradycardia.  Based on patient's reported severe fatigue in the setting of diarrhea will consider dehydration.  Some fluid will be given.  I wanted  to do a rectal exam with fecal occult given her report of dark tarry stools in the setting of severe fatigue and history of anemia however patient refused.  We will see what her blood work shows.  Due to the intermittent bradycardia that was on telemetry, this could be episodes of symptomatic bradycardia.  We will keep monitoring this and we will check electrolytes.  Anticipate reassessment after work-up.  2:08 PM On reassessment, patient reports she was not having any chest discomfort despite which she said earlier.  She is starting to feel slightly better after some fluids.  She still feels fatigue.  Next  Laboratory testing shows continued pancytopenia.  Troponin is elevated compared to prior at 0.13.  This will be trended.  Lactic acid not elevated.  Chest x-ray showed concern for other nodularity or mass and a CT with contrast was requested.  Patient reports that she makes a "good amount" of urine every day and does not want a contrasted scan.  A Noncon scan was ordered to look for pneumonia or other mass or abnormality.    Anticipate following up on delta troponin and CT scan to determine disposition for this patient who is having severe fatigue and dark diarrhea.  4:19 PM Delta troponin is 0.01 higher than it was before.  This is relatively stable as the patient takes dialysis.  Patient says that she has had no pain and she is feeling much better after fluids.  She think she slightly dehydrated in the setting of the recent diarrhea.    CT scan showed no acute pulmonary disease.  There was scar tissue that is slightly worse from 2008 however I do not feel she has a pneumonia.  No nodules were seen which were suspected on x-ray.  We had a shared decision-making conversation given her pancytopenia, slightly rising troponin, and the reported transient chest pain yesterday about admission however she wants to go home.  She will follow-up with her PCP and understands return precautions.    Patient  had no other worsens or concerns and was discharged in good condition after improvement after fluids.   Final Clinical Impressions(s) / ED Diagnoses   Final diagnoses:  Dehydration  Diarrhea, unspecified type  Fatigue, unspecified type  Lightheaded  Elevated troponin    ED Discharge Orders  None      Clinical Impression: 1. Dehydration   2. Diarrhea, unspecified type   3. Fatigue, unspecified type   4. Lightheaded   5. Elevated troponin     Disposition: Discharge  Condition: Good  I have discussed the results, Dx and Tx plan with the pt(& family if present). He/she/they expressed understanding and agree(s) with the plan. Discharge instructions discussed at great length. Strict return precautions discussed and pt &/or family have verbalized understanding of the instructions. No further questions at time of discharge.    New Prescriptions   No medications on file    Follow Up: Ann Held, DO [address] [place] [address] [phone]     [facility] EMERGENCY DEPARTMENT [address] [id number] mc [place]         Zayra Devito, Gwenyth Allegra, MD 09/19/18 [phone]

## 2018-09-19 NOTE — ED Notes (Signed)
Patient forgot to Get a UA, when she went to bathroom.

## 2018-09-19 NOTE — ED Notes (Signed)
Dr. Sherry Ruffing is with another patient I-stat troponin result 0.14 given to RN Ronalee Belts

## 2018-09-19 NOTE — ED Triage Notes (Signed)
Dialysis Monday. Weakness x1 week. NOTE IN PROGRESS

## 2018-09-19 NOTE — ED Notes (Signed)
Patient refused to Have Rectal Temp Taken, Dr. Sherry Ruffing is aware.

## 2018-09-21 DIAGNOSIS — N2581 Secondary hyperparathyroidism of renal origin: Secondary | ICD-10-CM | POA: Diagnosis not present

## 2018-09-21 DIAGNOSIS — N186 End stage renal disease: Secondary | ICD-10-CM | POA: Diagnosis not present

## 2018-09-24 DIAGNOSIS — N186 End stage renal disease: Secondary | ICD-10-CM | POA: Diagnosis not present

## 2018-09-24 DIAGNOSIS — N2581 Secondary hyperparathyroidism of renal origin: Secondary | ICD-10-CM | POA: Diagnosis not present

## 2018-09-25 DIAGNOSIS — N186 End stage renal disease: Secondary | ICD-10-CM | POA: Diagnosis not present

## 2018-09-25 DIAGNOSIS — N2581 Secondary hyperparathyroidism of renal origin: Secondary | ICD-10-CM | POA: Diagnosis not present

## 2018-09-28 ENCOUNTER — Other Ambulatory Visit: Payer: Self-pay

## 2018-09-28 ENCOUNTER — Emergency Department (HOSPITAL_COMMUNITY): Payer: Medicare HMO

## 2018-09-28 ENCOUNTER — Emergency Department (HOSPITAL_COMMUNITY)
Admission: EM | Admit: 2018-09-28 | Discharge: 2018-09-28 | Disposition: A | Discharge status: Home / Self Care | Payer: Medicare HMO | Attending: Emergency Medicine | Admitting: Emergency Medicine

## 2018-09-28 DIAGNOSIS — Z992 Dependence on renal dialysis: Secondary | ICD-10-CM | POA: Insufficient documentation

## 2018-09-28 DIAGNOSIS — Z79899 Other long term (current) drug therapy: Secondary | ICD-10-CM | POA: Diagnosis not present

## 2018-09-28 DIAGNOSIS — I132 Hypertensive heart and chronic kidney disease with heart failure and with stage 5 chronic kidney disease, or end stage renal disease: Secondary | ICD-10-CM | POA: Insufficient documentation

## 2018-09-28 DIAGNOSIS — N186 End stage renal disease: Secondary | ICD-10-CM | POA: Diagnosis not present

## 2018-09-28 DIAGNOSIS — I5032 Chronic diastolic (congestive) heart failure: Secondary | ICD-10-CM | POA: Diagnosis not present

## 2018-09-28 DIAGNOSIS — K449 Diaphragmatic hernia without obstruction or gangrene: Secondary | ICD-10-CM | POA: Diagnosis not present

## 2018-09-28 DIAGNOSIS — Z7982 Long term (current) use of aspirin: Secondary | ICD-10-CM | POA: Diagnosis not present

## 2018-09-28 DIAGNOSIS — N390 Urinary tract infection, site not specified: Secondary | ICD-10-CM | POA: Insufficient documentation

## 2018-09-28 DIAGNOSIS — K5730 Diverticulosis of large intestine without perforation or abscess without bleeding: Secondary | ICD-10-CM | POA: Diagnosis not present

## 2018-09-28 DIAGNOSIS — R319 Hematuria, unspecified: Secondary | ICD-10-CM | POA: Insufficient documentation

## 2018-09-28 DIAGNOSIS — M25551 Pain in right hip: Secondary | ICD-10-CM | POA: Diagnosis not present

## 2018-09-28 LAB — COMPREHENSIVE METABOLIC PANEL
ALBUMIN: 2.9 g/dL — AB (ref 3.5–5.0)
ALT: 29 U/L (ref 0–44)
AST: 46 U/L — ABNORMAL HIGH (ref 15–41)
Alkaline Phosphatase: 109 U/L (ref 38–126)
Anion gap: 11 (ref 5–15)
BUN: 51 mg/dL — ABNORMAL HIGH (ref 8–23)
CHLORIDE: 102 mmol/L (ref 98–111)
CO2: 25 mmol/L (ref 22–32)
CREATININE: 6.34 mg/dL — AB (ref 0.44–1.00)
Calcium: 9.9 mg/dL (ref 8.9–10.3)
GFR calc Af Amer: 7 mL/min — ABNORMAL LOW (ref >60)
GFR, EST NON AFRICAN AMERICAN: 6 mL/min — AB (ref >60)
Glucose, Bld: 88 mg/dL (ref 70–99)
Potassium: 3.9 mmol/L (ref 3.5–5.1)
Sodium: 138 mmol/L (ref 135–145)
Total Bilirubin: 0.6 mg/dL (ref 0.3–1.2)
Total Protein: 6.5 g/dL (ref 6.5–8.1)

## 2018-09-28 LAB — URINALYSIS, ROUTINE W REFLEX MICROSCOPIC
Bilirubin Urine: NEGATIVE
Glucose, UA: 50 mg/dL — AB
KETONES UR: NEGATIVE mg/dL
Leukocytes, UA: NEGATIVE
Nitrite: NEGATIVE
PROTEIN: 100 mg/dL — AB
Specific Gravity, Urine: 1.016 (ref 1.005–1.030)
pH: 5 (ref 5.0–8.0)

## 2018-09-28 LAB — CBC
HEMATOCRIT: 31.5 % — AB (ref 36.0–46.0)
Hemoglobin: 9.3 g/dL — ABNORMAL LOW (ref 12.0–15.0)
MCH: 29.8 pg (ref 26.0–34.0)
MCHC: 29.5 g/dL — AB (ref 30.0–36.0)
MCV: 101 fL — ABNORMAL HIGH (ref 80.0–100.0)
NRBC: 0 % (ref 0.0–0.2)
Platelets: 65 10*3/uL — ABNORMAL LOW (ref 150–400)
RBC: 3.12 MIL/uL — ABNORMAL LOW (ref 3.87–5.11)
RDW: 19.6 % — ABNORMAL HIGH (ref 11.5–15.5)
WBC: 2.6 10*3/uL — AB (ref 4.0–10.5)

## 2018-09-28 MED ORDER — CEPHALEXIN 250 MG PO CAPS: 250.0000 mg | ORAL_CAPSULE | Freq: Two times a day (BID) | ORAL | 0 refills | Status: DC

## 2018-09-28 MED ORDER — CEPHALEXIN 250 MG PO CAPS
500.0000 mg | ORAL_CAPSULE | Freq: Once | ORAL | Status: AC
  Administered 2018-09-28: 500 mg via ORAL
  Filled 2018-09-28: qty 2

## 2018-09-28 NOTE — ED Notes (Signed)
Patient transported to CT 

## 2018-09-28 NOTE — ED Triage Notes (Signed)
Pt reports right hip pain that has caused her to wake up in her sleep the last two nights. Pt denies any injury to her hip, and denied any falls. Pt has dialysis on MWF but did not go to dialysis today. Pt took one tylenol today for pain. Pt denies any pain right now.

## 2018-09-28 NOTE — Discharge Instructions (Addendum)
No evidence of broken bones or other abnormality noted on CAT scan Does appear he may have a urinary tract infection you are being treated with antibiotics Return to the emergency department if pain worsens or you are unable to control with Tylenol, you develop fever, or worse at any time

## 2018-09-28 NOTE — ED Provider Notes (Signed)
[facility] Provider Note   CSN: [id number] Arrival date & time: 09/28/18  1136     History   Chief Complaint Chief Complaint  Patient presents with  . Hip Pain    HPI ARMONIE METTLER is a 77 y.o. female.     77 year old female on dialysis who presents today complaining of 2-day history of right hip pain.  She states that the pain began 2 nights ago during the night and was present when she awoke.  The pain is worse with movement especially rising from a chair.  She has not had any trauma or deformity.  She denies any abdominal pain, nausea, vomiting, diarrhea, increased frequency of urination, burning with urination, hematuria, or similar symptoms in the past.  She does go to dialysis Monday Wednesday Friday.  She was due for dialysis today but did not go due to the pain in her hip.  She has taken Tylenol at home with some relief.  The pain is moderate to severe.  Past Medical History:  Diagnosis Date  . DEPRESSION   . DIABETES MELLITUS, TYPE I   . GERD   . GLAUCOMA   . Headache(784.0)   . Hepatitis B carrier ([place])    05/2009: neg Hep C; Hep B: core pos, Surf neg; fatty liver US 8/10 - 7/13  . HYPERLIPIDEMIA   . HYPERTENSION   . Kidney failure    dialysis 3 times per week  . OSTEOPENIA   . POSTMENOPAUSAL STATUS   . Pulmonary edema   . Unspecified vitamin D deficiency     Patient Active Problem List   Diagnosis Date Noted  . AV graft malfunction (HCC) 09/03/2018  . Urinary tract infection without hematuria 08/21/2018  . Memory loss 08/21/2018  . Anemia, chronic disease 03/01/2018  . Vasovagal syncope 03/01/2018  . Macrocytic anemia 01/28/2018  . Orthostatic syncope 01/28/2018  . EKG, abnormal   . Acute metabolic encephalopathy 85/46/2703  . Influenza 11/16/2017  . Fever in adult 11/15/2017  . Demand ischemia ([place]) 11/10/2017  . Normocytic anemia 11/10/2017  . Aortic atherosclerosis ([place]) 11/10/2017  . Respiratory failure  with hypoxia ([place]) 08/18/2017  . Volume overload 08/18/2017  . Dialysis patient, noncompliant ([place]) 08/17/2017  . Thumb pain, left 12/22/2016  . Chronic diastolic heart failure ([place]) 11/25/2016  . ESRD (end stage renal disease) on dialysis ([place]) 06/25/2014  . Malignant hypertension 05/17/2014  . Chest pain 05/17/2014  . Elevated troponin 05/17/2014  . Hypertensive emergency 05/17/2014  . NSTEMI (non-ST elevated myocardial infarction) ([place]) 05/17/2014  . Chronic kidney disease, stage IV (severe) ([place]) 12/05/2013  . GERD 06/17/2009  . Hepatitis B carrier ([place]) 06/17/2009  . DEPRESSION 05/20/2009  . Unspecified glaucoma 05/20/2009  . HEADACHE 05/20/2009  . CARDIAC MURMUR 05/20/2009  . OSTEOPENIA 02/15/2008  . POSTMENOPAUSAL STATUS 12/21/2007  . Hyperlipidemia LDL goal <70 09/21/2007  . Essential hypertension 09/13/2007  . Type II diabetes mellitus with nephropathy [facility])     Past Surgical History:  Procedure Laterality Date  . A/V SHUNTOGRAM N/A 09/06/2018   Procedure: A/V SHUNTOGRAM - Left AV;  Surgeon: Marty Heck, MD;  Location: [facility];  Service: Cardiovascular;  Laterality: N/A;  . AV FISTULA PLACEMENT Left 05/29/2014   Procedure: INSERTION OF ARTERIOVENOUS (AV) GORE-TEX GRAFT ARM-LEFT;  Surgeon: Angelia Mould, MD;  Location: [facility];  Service: Vascular;  Laterality: Left;  . CHOLECYSTECTOMY  02/12/07  . INSERTION OF DIALYSIS CATHETER Right 05/27/2014   Procedure: INSERTION  OF DIALYSIS CATHETER;  Surgeon: Angelia Mould, MD;  Location: [facility];  Service: Vascular;  Laterality: Right;  . PERIPHERAL VASCULAR BALLOON ANGIOPLASTY Left 09/06/2018   Procedure: PERIPHERAL VASCULAR BALLOON ANGIOPLASTY;  Surgeon: Marty Heck, MD;  Location: [facility];  Service: Cardiovascular;  Laterality: Left;  arm shunt  . REFRACTIVE SURGERY  07/29/09   Dr. Bing Plume  . TONSILLECTOMY       OB History   None      Home Medications    Prior to Admission  medications   Medication Sig Start Date End Date Taking? Authorizing Provider  Acetaminophen (TYLENOL PO) Take 1 tablet by mouth daily as needed (pain/headache).    [provider]  amLODipine (NORVASC) 10 MG tablet Take 10 mg by mouth daily.    [provider]  B Complex-C-Zn-Folic Acid (DIALYVITE/ZINC) TABS Take 1 tablet by mouth daily.    [provider]  calcitRIOL (ROCALTROL) 0.5 MCG capsule Take 3 capsules (1.5 mcg total) by mouth 3 (three) times a week. 11/13/17   Jani Gravel, MD  carvedilol (COREG) 25 MG tablet TAKE 1 TABLET (25 MG TOTAL) BY MOUTH 2 (TWO) TIMES DAILY WITH A MEAL. 08/24/18   Ann Held, DO  cefUROXime (CEFTIN) 250 MG tablet Take 1 tablet (250 mg total) by mouth 2 (two) times daily with a meal. Patient not taking: Reported on 09/19/2018 08/21/18   Ann Held, DO  ferrous sulfate 325 (65 FE) MG tablet Take 325-650 mg by mouth daily with breakfast.    [provider]  isosorbide mononitrate (IMDUR) 30 MG 24 hr tablet TAKE 1 TABLET BY MOUTH TWICE A DAY Patient taking differently: Take 30 mg by mouth 2 (two) times daily.  05/29/18   Ann Held, DO  Multiple Vitamin (MULTIVITAMIN WITH MINERALS) TABS tablet Take 1 tablet by mouth daily.     [provider]  nitroGLYCERIN (NITROSTAT) 0.4 MG SL tablet Place 1 tablet (0.4 mg total) under the tongue every 5 (five) minutes x 3 doses as needed for chest pain. 01/29/18   Mendel Corning, MD    Family History Family History  Problem Relation Age of Onset  . Kidney disease Mother   . Coronary artery disease Other   . Diabetes Other   . Hypertension Other   . Arthritis Other   . Kidney disease Sister     Social History Social History   Tobacco Use  . Smoking status: Never Smoker  . Smokeless tobacco: Never Used  . Tobacco comment: Married x's 68yrs, 6 kids-scattered [employer]. Retired Consulting civil engineer  Substance Use Topics  . Alcohol use: No  . Drug use:  No     Allergies   Hydralazine and Robaxin [methocarbamol]   Review of Systems Review of Systems  All other systems reviewed and are negative.    Physical Exam Updated Vital Signs BP (!) 155/78   Temp 98.4 F (36.9 C) (Oral)   Ht 1.651 m (5\' 5" )   Wt 55.8 kg   BMI 20.47 kg/m   Physical Exam  Constitutional: She is oriented to person, place, and time. She appears well-developed and well-nourished.  HENT:  Head: Normocephalic and atraumatic.  Right Ear: External ear normal.  Left Ear: External ear normal.  Eyes: Pupils are equal, round, and reactive to light.  Neck: Normal range of motion.  Cardiovascular: Normal rate and regular rhythm.  Pulmonary/Chest: Effort normal.  Abdominal: Soft. Bowel sounds are normal.  Musculoskeletal:  Normal range of motion.       Legs: Full arom of right hip dp pulse intact No swelling, deformity, or trauma noted   Neurological: She is alert and oriented to person, place, and time. She displays normal reflexes. No cranial nerve deficit. Coordination normal.  Skin: Skin is warm and dry. Capillary refill takes less than 2 seconds.  Psychiatric: She has a normal mood and affect.  Nursing note and vitals reviewed.    ED Treatments / Results  Labs (all labs ordered are listed, but only abnormal results are displayed) Labs Reviewed - No data to display  EKG None  Radiology No results found.  Procedures Procedures (including critical care time)  Medications Ordered in ED Medications - No data to display   Initial Impression / Assessment and Plan / ED Course  I have reviewed the triage vital signs and the nursing notes.  Pertinent labs & imaging results that were available during my care of the patient were reviewed by me and considered in my medical decision making (see chart for details).    77 year old female on dialysis presents today complaining of nontraumatic right hip pain.  No evidence of fracture seen on x-Leyanna Bittman or  CT.  CT obtained no evidence of kidney stones are noted.  With laboratory abnormalities consistent with renal failure with creatinine at 6.34 No evidence of volume overload Patient anemic with hemoglobin of 9.3 first prior hemoglobin of 10 and has been down as low as 0.6 review of prior labs Also leukopenia is noted with white blood cell count of 2600 which also appears to be chronic in nature with last prior at 3000 Urinalysis is significant for rare bacteria, hyaline casts, mucus, 0-5 red blood cells, and 0-5 white blood cells.  This will be cultured and Keflex started Discussed results with patient.  She appears stable for discharge. Vitals:   09/28/18 1144  BP: (!) 155/78  Pulse: 78  Temp: 98.4 F (36.9 C)  SpO2: 97%    Final Clinical Impressions(s) / ED Diagnoses   Final diagnoses:  Right hip pain  Urinary tract infection with hematuria, site unspecified    ED Discharge Orders    None       Pattricia Boss, MD 09/28/18 1346

## 2018-09-29 DIAGNOSIS — Z992 Dependence on renal dialysis: Secondary | ICD-10-CM | POA: Diagnosis not present

## 2018-09-29 DIAGNOSIS — N186 End stage renal disease: Secondary | ICD-10-CM | POA: Diagnosis not present

## 2018-09-29 DIAGNOSIS — E1122 Type 2 diabetes mellitus with diabetic chronic kidney disease: Secondary | ICD-10-CM | POA: Diagnosis not present

## 2018-09-29 LAB — URINE CULTURE: CULTURE: NO GROWTH

## 2018-10-01 DIAGNOSIS — N186 End stage renal disease: Secondary | ICD-10-CM | POA: Diagnosis not present

## 2018-10-01 DIAGNOSIS — N2581 Secondary hyperparathyroidism of renal origin: Secondary | ICD-10-CM | POA: Diagnosis not present

## 2018-10-03 DIAGNOSIS — N2581 Secondary hyperparathyroidism of renal origin: Secondary | ICD-10-CM | POA: Diagnosis not present

## 2018-10-03 DIAGNOSIS — N186 End stage renal disease: Secondary | ICD-10-CM | POA: Diagnosis not present

## 2018-10-05 DIAGNOSIS — N186 End stage renal disease: Secondary | ICD-10-CM | POA: Diagnosis not present

## 2018-10-05 DIAGNOSIS — N2581 Secondary hyperparathyroidism of renal origin: Secondary | ICD-10-CM | POA: Diagnosis not present

## 2018-10-08 DIAGNOSIS — N186 End stage renal disease: Secondary | ICD-10-CM | POA: Diagnosis not present

## 2018-10-08 DIAGNOSIS — N2581 Secondary hyperparathyroidism of renal origin: Secondary | ICD-10-CM | POA: Diagnosis not present

## 2018-10-09 ENCOUNTER — Ambulatory Visit (INDEPENDENT_AMBULATORY_CARE_PROVIDER_SITE_OTHER): Payer: Medicare HMO | Admitting: Family Medicine

## 2018-10-09 ENCOUNTER — Encounter: Payer: Self-pay | Admitting: Family Medicine

## 2018-10-09 VITALS — BP 160/66 | HR 74 | Temp 99.3°F | Resp 16 | Ht 65.0 in | Wt 129.2 lb

## 2018-10-09 DIAGNOSIS — R413 Other amnesia: Secondary | ICD-10-CM | POA: Diagnosis not present

## 2018-10-09 DIAGNOSIS — N186 End stage renal disease: Secondary | ICD-10-CM

## 2018-10-09 DIAGNOSIS — N184 Chronic kidney disease, stage 4 (severe): Secondary | ICD-10-CM | POA: Diagnosis not present

## 2018-10-09 DIAGNOSIS — I10 Essential (primary) hypertension: Secondary | ICD-10-CM | POA: Diagnosis not present

## 2018-10-09 NOTE — Progress Notes (Signed)
Patient ID: [name] [name], female    DOB: [birth year]  Age: 77 y.o. MRN: [id number]    Subjective:  Subjective  HPI [name] [name] presents for f/u from er for hip pain but it is better.  She is going to dialysis 3x a week and has been weak.  No falls.   her husband is with her.  She states her daughter states she is having some memory problems but she does not want to see neuro psych/ neuro at this time.  Her husband agrees    Review of Systems  Constitutional: Positive for fatigue. Negative for appetite change, diaphoresis and unexpected weight change.  Eyes: Negative for pain, redness and visual disturbance.  Respiratory: Negative for cough, chest tightness, shortness of breath and wheezing.   Cardiovascular: Negative for chest pain, palpitations and leg swelling.  Endocrine: Negative for cold intolerance, heat intolerance, polydipsia, polyphagia and polyuria.  Genitourinary: Negative for difficulty urinating, dysuria and frequency.  Neurological: Positive for weakness. Negative for dizziness, light-headedness, numbness and headaches.    History Past Medical History:  Diagnosis Date  . DEPRESSION   . DIABETES MELLITUS, TYPE I   . GERD   . GLAUCOMA   . Headache(784.0)   . Hepatitis B carrier ([place])    05/2009: neg Hep C; Hep B: core pos, Surf neg; fatty liver US 8/10 - 7/13  . HYPERLIPIDEMIA   . HYPERTENSION   . Kidney failure    dialysis 3 times per week  . OSTEOPENIA   . POSTMENOPAUSAL STATUS   . Pulmonary edema   . Unspecified vitamin D deficiency     She has a past surgical history that includes Cholecystectomy (02/12/07); Tonsillectomy; Refractive surgery (07/29/09); Insertion of dialysis catheter (Right, 05/27/2014); AV fistula placement (Left, 05/29/2014); A/V SHUNTOGRAM (N/A, 09/06/2018); and PERIPHERAL VASCULAR BALLOON ANGIOPLASTY (Left, 09/06/2018).   Her family history includes Arthritis in her other; Coronary artery disease in her other; Diabetes in her other;  Hypertension in her other; Kidney disease in her mother and sister.She reports that she has never smoked. She has never used smokeless tobacco. She reports that she does not drink alcohol or use drugs.  Current Outpatient Medications on File Prior to Visit  Medication Sig Dispense Refill  . Acetaminophen (TYLENOL PO) Take 1 tablet by mouth daily as needed (pain/headache).    Marland Kitchen amLODipine (NORVASC) 10 MG tablet Take 10 mg by mouth daily.    . B Complex-C-Zn-Folic Acid (DIALYVITE/ZINC) TABS Take 1 tablet by mouth daily.    . calcitRIOL (ROCALTROL) 0.5 MCG capsule Take 3 capsules (1.5 mcg total) by mouth 3 (three) times a week. 15 capsule 0  . carvedilol (COREG) 25 MG tablet TAKE 1 TABLET (25 MG TOTAL) BY MOUTH 2 (TWO) TIMES DAILY WITH A MEAL. 180 tablet 1  . cephALEXin (KEFLEX) 250 MG capsule Take 1 capsule (250 mg total) by mouth 2 (two) times daily. 28 capsule 0  . ferrous sulfate 325 (65 FE) MG tablet Take 325-650 mg by mouth daily with breakfast.    . isosorbide mononitrate (IMDUR) 30 MG 24 hr tablet TAKE 1 TABLET BY MOUTH TWICE A DAY (Patient taking differently: Take 30 mg by mouth 2 (two) times daily. ) 180 tablet 1  . Multiple Vitamin (MULTIVITAMIN WITH MINERALS) TABS tablet Take 1 tablet by mouth daily.     . nitroGLYCERIN (NITROSTAT) 0.4 MG SL tablet Place 1 tablet (0.4 mg total) under the tongue every 5 (five) minutes x 3 doses as needed for chest pain.  30 tablet 12   No current facility-administered medications on file prior to visit.      Objective:  Objective  Physical Exam  Constitutional: She is oriented to person, place, and time. She appears well-developed and well-nourished.  HENT:  Head: Normocephalic and atraumatic.  Eyes: Conjunctivae and EOM are normal.  Neck: Normal range of motion. Neck supple. No JVD present. Carotid bruit is not present. No thyromegaly present.  Cardiovascular: Normal rate and regular rhythm.  Murmur heard. Pulmonary/Chest: Effort normal and breath  sounds normal. No respiratory distress. She has no wheezes. She has no rales. She exhibits no tenderness.  Musculoskeletal: She exhibits no edema.  Neurological: She is alert and oriented to person, place, and time.  Psychiatric: She has a normal mood and affect. Cognition and memory are impaired. She exhibits normal remote memory.  Pt refuses referral for memory  Nursing note and vitals reviewed.  BP (!) 160/66   Pulse 74   Temp 99.3 F (37.4 C) (Oral)   Resp 16   Ht 5\' 5"  (1.651 m)   Wt 129 lb 3.2 oz (58.6 kg)   SpO2 98%   BMI 21.50 kg/m  Wt Readings from Last 3 Encounters:  10/09/18 129 lb 3.2 oz (58.6 kg)  09/28/18 123 lb (55.8 kg)  09/06/18 123 lb (55.8 kg)     Lab Results  Component Value Date   WBC 2.6 (L) 09/28/2018   HGB 9.3 (L) 09/28/2018   HCT 31.5 (L) 09/28/2018   PLT 65 (L) 09/28/2018   GLUCOSE 88 09/28/2018   CHOL 193 08/21/2018   TRIG 92.0 08/21/2018   HDL 62.60 08/21/2018   LDLDIRECT 196.2 04/26/2013   LDLCALC 112 (H) 08/21/2018   ALT 29 09/28/2018   AST 46 (H) 09/28/2018   NA 138 09/28/2018   K 3.9 09/28/2018   CL 102 09/28/2018   CREATININE 6.34 (H) 09/28/2018   BUN 51 (H) 09/28/2018   CO2 25 09/28/2018   TSH 1.285 09/19/2018   INR 1.11 09/03/2018   HGBA1C 4.8 11/15/2017   MICROALBUR 174.7 (H) 01/03/2008    Ct Renal Stone Study  Result Date: 09/28/2018 CLINICAL DATA:  Right flank pain.  Chronic renal failure. EXAM: CT ABDOMEN AND PELVIS WITHOUT CONTRAST TECHNIQUE: Multidetector CT imaging of the abdomen and pelvis was performed following the standard protocol without IV contrast. COMPARISON:  CT scan dated 11/07/2006 FINDINGS: Lower chest: No acute abnormalities. Chronic atelectasis and slight bronchiectasis at the right lung base medially. Aortic atherosclerosis. Small chronic hiatal hernia. Hepatobiliary: There is a nodular liver contour suggestive of cirrhosis. No focal up attic lesions. Cholecystectomy. No dilated bile ducts. Pancreas: Focal  small calcification in the head of the pancreas. The pancreas is otherwise normal. Spleen: Normal in size without focal abnormality. Adrenals/Urinary Tract: Normal adrenal glands. Bilateral renal atrophy with parenchymal and vascular calcifications. No hydronephrosis. The bladder is empty. Stomach/Bowel: Multiple diverticula in the sigmoid portion of the colon. Small hiatal hernia. The bowel is otherwise normal including the terminal ileum and appendix Vascular/Lymphatic: Aortic atherosclerosis. No enlarged abdominal or pelvic lymph nodes. Reproductive: Uterus and bilateral adnexa are unremarkable. Other: No abdominal wall hernia or abnormality. No abdominopelvic ascites. Musculoskeletal: No acute abnormality. Multilevel degenerative facet arthritis in the lumbar spine. Moderate spinal stenosis at L4-5. IMPRESSION: 1. No acute abnormality of the abdomen or pelvis. 2. Sigmoid diverticulosis. 3. Cirrhosis. 4. Small chronic hiatal hernia. Electronically Signed   By: Lorriane Shire M.D.   On: 09/28/2018 13:27   Dg Hip Unilat  W Or Wo Pelvis 2-3 Views Right  Result Date: 09/28/2018 CLINICAL DATA:  Right hip pain. EXAM: DG HIP (WITH OR WITHOUT PELVIS) 2-3V RIGHT COMPARISON:  None. FINDINGS: No acute fracture or dislocation identified. Bones are osteopenic. No bony lesions or destruction. Mild osteoarthritis of the hip joints bilaterally. No evidence of pelvic fracture or diastasis. Extensive vascular calcifications present in a pattern consistent with underlying diabetes. IMPRESSION: No acute fracture or dislocation. Mild osteoarthritis of both hip joints. Electronically Signed   By: Aletta Edouard M.D.   On: 09/28/2018 13:33     Assessment & Plan:  Plan  I have discontinued Arisha Gervais. Ammirati's cefUROXime. I am also having her maintain her multivitamin with minerals, ferrous sulfate, calcitRIOL, nitroGLYCERIN, isosorbide mononitrate, carvedilol, Acetaminophen (TYLENOL PO), amLODipine, DIALYVITE/ZINC, and  cephALEXin.  No orders of the defined types were placed in this encounter.   Problem List Items Addressed This Visit      Unprioritized   Memory loss    Other Visit Diagnoses    ESRD (end stage renal disease) ([place])    -  Primary      Follow-up: Return in about 3 months (around 01/08/2019).  Ann Held, DO

## 2018-10-09 NOTE — Patient Instructions (Signed)

## 2018-10-10 NOTE — Assessment & Plan Note (Signed)
bp poorly controlled Per nephrology--- repeat was improved

## 2018-10-10 NOTE — Assessment & Plan Note (Signed)
Per neprology On dialysis

## 2018-10-12 ENCOUNTER — Emergency Department (HOSPITAL_COMMUNITY): Payer: Medicare HMO

## 2018-10-12 ENCOUNTER — Encounter (HOSPITAL_COMMUNITY): Payer: Self-pay | Admitting: Emergency Medicine

## 2018-10-12 ENCOUNTER — Emergency Department (HOSPITAL_COMMUNITY)
Admission: EM | Admit: 2018-10-12 | Discharge: 2018-10-12 | Disposition: A | Discharge status: Home / Self Care | Payer: Medicare HMO | Attending: Emergency Medicine | Admitting: Emergency Medicine

## 2018-10-12 DIAGNOSIS — E109 Type 1 diabetes mellitus without complications: Secondary | ICD-10-CM | POA: Diagnosis not present

## 2018-10-12 DIAGNOSIS — R000 Tachycardia, unspecified: Secondary | ICD-10-CM | POA: Diagnosis not present

## 2018-10-12 DIAGNOSIS — R079 Chest pain, unspecified: Secondary | ICD-10-CM | POA: Diagnosis not present

## 2018-10-12 DIAGNOSIS — Z992 Dependence on renal dialysis: Secondary | ICD-10-CM | POA: Diagnosis not present

## 2018-10-12 DIAGNOSIS — I5032 Chronic diastolic (congestive) heart failure: Secondary | ICD-10-CM | POA: Diagnosis not present

## 2018-10-12 DIAGNOSIS — R05 Cough: Secondary | ICD-10-CM | POA: Insufficient documentation

## 2018-10-12 DIAGNOSIS — R0902 Hypoxemia: Secondary | ICD-10-CM | POA: Diagnosis not present

## 2018-10-12 DIAGNOSIS — Z79899 Other long term (current) drug therapy: Secondary | ICD-10-CM | POA: Diagnosis not present

## 2018-10-12 DIAGNOSIS — I132 Hypertensive heart and chronic kidney disease with heart failure and with stage 5 chronic kidney disease, or end stage renal disease: Secondary | ICD-10-CM | POA: Insufficient documentation

## 2018-10-12 DIAGNOSIS — R059 Cough, unspecified: Secondary | ICD-10-CM

## 2018-10-12 DIAGNOSIS — E785 Hyperlipidemia, unspecified: Secondary | ICD-10-CM | POA: Insufficient documentation

## 2018-10-12 DIAGNOSIS — N186 End stage renal disease: Secondary | ICD-10-CM | POA: Diagnosis not present

## 2018-10-12 LAB — CBC WITH DIFFERENTIAL/PLATELET
Abs Immature Granulocytes: 0.01 10*3/uL (ref 0.00–0.07)
BASOS ABS: 0 10*3/uL (ref 0.0–0.1)
BASOS PCT: 1 %
Eosinophils Absolute: 0 10*3/uL (ref 0.0–0.5)
Eosinophils Relative: 0 %
HCT: 30.8 % — ABNORMAL LOW (ref 36.0–46.0)
Hemoglobin: 9.3 g/dL — ABNORMAL LOW (ref 12.0–15.0)
IMMATURE GRANULOCYTES: 0 %
Lymphocytes Relative: 11 %
Lymphs Abs: 0.3 10*3/uL — ABNORMAL LOW (ref 0.7–4.0)
MCH: 30.5 pg (ref 26.0–34.0)
MCHC: 30.2 g/dL (ref 30.0–36.0)
MCV: 101 fL — ABNORMAL HIGH (ref 80.0–100.0)
Monocytes Absolute: 0.2 10*3/uL (ref 0.1–1.0)
Monocytes Relative: 8 %
NRBC: 0 % (ref 0.0–0.2)
Neutro Abs: 2.1 10*3/uL (ref 1.7–7.7)
Neutrophils Relative %: 80 %
PLATELETS: 58 10*3/uL — AB (ref 150–400)
RBC: 3.05 MIL/uL — ABNORMAL LOW (ref 3.87–5.11)
RDW: 19.2 % — ABNORMAL HIGH (ref 11.5–15.5)
WBC: 2.7 10*3/uL — AB (ref 4.0–10.5)

## 2018-10-12 LAB — I-STAT CHEM 8, ED
BUN: 68 mg/dL — ABNORMAL HIGH (ref 8–23)
Calcium, Ion: 1.1 mmol/L — ABNORMAL LOW (ref 1.15–1.40)
Chloride: 105 mmol/L (ref 98–111)
Creatinine, Ser: 9 mg/dL — ABNORMAL HIGH (ref 0.44–1.00)
Glucose, Bld: 98 mg/dL (ref 70–99)
HCT: 30 % — ABNORMAL LOW (ref 36.0–46.0)
Hemoglobin: 10.2 g/dL — ABNORMAL LOW (ref 12.0–15.0)
POTASSIUM: 5 mmol/L (ref 3.5–5.1)
Sodium: 139 mmol/L (ref 135–145)
TCO2: 23 mmol/L (ref 22–32)

## 2018-10-12 MED ORDER — DOXYCYCLINE HYCLATE 100 MG PO CAPS: 100.0000 mg | ORAL_CAPSULE | Freq: Two times a day (BID) | ORAL | 0 refills | Status: DC

## 2018-10-12 MED ORDER — ALBUTEROL (5 MG/ML) CONTINUOUS INHALATION SOLN
10.0000 mg/h | INHALATION_SOLUTION | RESPIRATORY_TRACT | Status: AC
  Administered 2018-10-12: 10 mg/h via RESPIRATORY_TRACT
  Filled 2018-10-12: qty 20

## 2018-10-12 NOTE — ED Triage Notes (Signed)
Pt arrives to the emergency department pov with a c/o of productive cough that started 1 week ago. Pt states it has been constant and last week was so bad she had to leave dialysis treatment early. Pt is MWF and was supposed to be there today at 1230pm. Pt denies any sob or chest pain. Pt can not think of anything that makes coughing worse states its been constant.   Pt has been taking cough syrup which does help a little but then "wears off".

## 2018-10-12 NOTE — ED Notes (Addendum)
Per [name] [name] at [facility] pt may come to dialysis at 1245pm tomorrow 10/13/18

## 2018-10-12 NOTE — Discharge Instructions (Addendum)
The dialysis center was contacted and states that they can do your dialysis tomorrow at 12:00. Doxycycline 100 mg by mouth twice daily for 10 days Seek medical exam for severe or worsening symptoms

## 2018-10-12 NOTE — ED Provider Notes (Signed)
[facility] EMERGENCY DEPARTMENT Provider Note   CSN: [id number] Arrival date & time: 10/12/18  1213     History   Chief Complaint Chief Complaint  Patient presents with  . Cough    HPI [name] [name] is a 77 y.o. female.  HPI  76 year old female, known history of diabetes, history of end-stage renal disease on dialysis, dialyzes Monday Wednesday and Friday.  She has also a known history of having hepatitis B, she has had multiple visits to the emergency department in the hospital over the last couple of months because of difficulties with her vascular access.  She presents today stating that she has been coughing for a couple of weeks, seems to be persistent, nothing seems to make it better or worse, not associated with fevers though sometimes she feels hot or cold.  No swelling of the legs, does not smoke cigarettes.  She has not had any medications for this.  Is not seen her family doctor for this.  Past Medical History:  Diagnosis Date  . DEPRESSION   . DIABETES MELLITUS, TYPE I   . GERD   . GLAUCOMA   . Headache(784.0)   . Hepatitis B carrier ([place])    05/2009: neg Hep C; Hep B: core pos, Surf neg; fatty liver US 8/10 - 7/13  . HYPERLIPIDEMIA   . HYPERTENSION   . Kidney failure    dialysis 3 times per week  . OSTEOPENIA   . POSTMENOPAUSAL STATUS   . Pulmonary edema   . Unspecified vitamin D deficiency     Patient Active Problem List   Diagnosis Date Noted  . AV graft malfunction (HCC) 09/03/2018  . Urinary tract infection without hematuria 08/21/2018  . Memory loss 08/21/2018  . Anemia, chronic disease 03/01/2018  . Vasovagal syncope 03/01/2018  . Macrocytic anemia 01/28/2018  . Orthostatic syncope 01/28/2018  . EKG, abnormal   . Acute metabolic encephalopathy 02/40/9735  . Influenza 11/16/2017  . Fever in adult 11/15/2017  . Demand ischemia ([place]) 11/10/2017  . Normocytic anemia 11/10/2017  . Aortic atherosclerosis ([place]) 11/10/2017  .  Respiratory failure with hypoxia ([place]) 08/18/2017  . Volume overload 08/18/2017  . Dialysis patient, noncompliant ([place]) 08/17/2017  . Thumb pain, left 12/22/2016  . Chronic diastolic heart failure ([place]) 11/25/2016  . ESRD (end stage renal disease) on dialysis ([place]) 06/25/2014  . Malignant hypertension 05/17/2014  . Chest pain 05/17/2014  . Elevated troponin 05/17/2014  . Hypertensive emergency 05/17/2014  . NSTEMI (non-ST elevated myocardial infarction) ([place]) 05/17/2014  . Chronic kidney disease, stage IV (severe) ([place]) 12/05/2013  . GERD 06/17/2009  . Hepatitis B carrier ([place]) 06/17/2009  . DEPRESSION 05/20/2009  . Unspecified glaucoma 05/20/2009  . HEADACHE 05/20/2009  . CARDIAC MURMUR 05/20/2009  . OSTEOPENIA 02/15/2008  . POSTMENOPAUSAL STATUS 12/21/2007  . Hyperlipidemia LDL goal <70 09/21/2007  . Essential hypertension 09/13/2007  . Type II diabetes mellitus with nephropathy [facility])     Past Surgical History:  Procedure Laterality Date  . A/V SHUNTOGRAM N/A 09/06/2018   Procedure: A/V SHUNTOGRAM - Left AV;  Surgeon: Marty Heck, MD;  Location: [facility];  Service: Cardiovascular;  Laterality: N/A;  . AV FISTULA PLACEMENT Left 05/29/2014   Procedure: INSERTION OF ARTERIOVENOUS (AV) GORE-TEX GRAFT ARM-LEFT;  Surgeon: Angelia Mould, MD;  Location: [facility];  Service: Vascular;  Laterality: Left;  . CHOLECYSTECTOMY  02/12/07  . INSERTION OF DIALYSIS CATHETER Right 05/27/2014   Procedure: INSERTION OF DIALYSIS CATHETER;  Surgeon: Angelia Mould, MD;  Location: [facility];  Service: Vascular;  Laterality: Right;  . PERIPHERAL VASCULAR BALLOON ANGIOPLASTY Left 09/06/2018   Procedure: PERIPHERAL VASCULAR BALLOON ANGIOPLASTY;  Surgeon: Marty Heck, MD;  Location: [facility];  Service: Cardiovascular;  Laterality: Left;  arm shunt  . REFRACTIVE SURGERY  07/29/09   Dr. Bing Plume  . TONSILLECTOMY       OB History   No obstetric history on file.       Home Medications    Prior to Admission medications   Medication Sig Start Date End Date Taking? Authorizing Provider  Acetaminophen (TYLENOL PO) Take 1 tablet by mouth daily as needed (pain/headache).    [provider]  amLODipine (NORVASC) 10 MG tablet Take 10 mg by mouth daily.    [provider]  B Complex-C-Zn-Folic Acid (DIALYVITE/ZINC) TABS Take 1 tablet by mouth daily.    [provider]  calcitRIOL (ROCALTROL) 0.5 MCG capsule Take 3 capsules (1.5 mcg total) by mouth 3 (three) times a week. 11/13/17   Jani Gravel, MD  carvedilol (COREG) 25 MG tablet TAKE 1 TABLET (25 MG TOTAL) BY MOUTH 2 (TWO) TIMES DAILY WITH A MEAL. 08/24/18   Ann Held, DO  cephALEXin (KEFLEX) 250 MG capsule Take 1 capsule (250 mg total) by mouth 2 (two) times daily. 09/28/18   Pattricia Boss, MD  doxycycline (VIBRAMYCIN) 100 MG capsule Take 1 capsule (100 mg total) by mouth 2 (two) times daily. 10/12/18   Noemi Chapel, MD  ferrous sulfate 325 (65 FE) MG tablet Take 325-650 mg by mouth daily with breakfast.    [provider]  isosorbide mononitrate (IMDUR) 30 MG 24 hr tablet TAKE 1 TABLET BY MOUTH TWICE A DAY Patient taking differently: Take 30 mg by mouth 2 (two) times daily.  05/29/18   Ann Held, DO  Multiple Vitamin (MULTIVITAMIN WITH MINERALS) TABS tablet Take 1 tablet by mouth daily.     [provider]  nitroGLYCERIN (NITROSTAT) 0.4 MG SL tablet Place 1 tablet (0.4 mg total) under the tongue every 5 (five) minutes x 3 doses as needed for chest pain. 01/29/18   Mendel Corning, MD    Family History Family History  Problem Relation Age of Onset  . Kidney disease Mother   . Coronary artery disease Other   . Diabetes Other   . Hypertension Other   . Arthritis Other   . Kidney disease Sister     Social History Social History   Tobacco Use  . Smoking status: Never Smoker  . Smokeless tobacco: Never Used  . Tobacco comment: Married  x's 42yrs, 6 kids-scattered [employer]. Retired Consulting civil engineer  Substance Use Topics  . Alcohol use: No  . Drug use: No     Allergies   Hydralazine and Robaxin [methocarbamol]   Review of Systems Review of Systems  All other systems reviewed and are negative.    Physical Exam Updated Vital Signs BP 133/81   Pulse 82   Resp (!) 24   SpO2 100%   Physical Exam Vitals signs and nursing note reviewed.  Constitutional:      General: She is not in acute distress.    Appearance: She is well-developed.  HENT:     Head: Normocephalic and atraumatic.     Mouth/Throat:     Pharynx: No oropharyngeal exudate.     Comments: The oropharynx is clear and moist, no exudate asymmetry hypertrophy or erythema Eyes:  General: No scleral icterus.       Right eye: No discharge.        Left eye: No discharge.     Conjunctiva/sclera: Conjunctivae normal.     Pupils: Pupils are equal, round, and reactive to light.  Neck:     Musculoskeletal: Normal range of motion and neck supple.     Thyroid: No thyromegaly.     Vascular: No JVD.  Cardiovascular:     Rate and Rhythm: Normal rate and regular rhythm.     Heart sounds: Normal heart sounds. No murmur. No friction rub. No gallop.      Comments: Heart rate of 75, good pulses, no edema Pulmonary:     Effort: Pulmonary effort is normal. No respiratory distress.     Breath sounds: Rales ( Scattered rales at the bases that clears with deep breathing and coughing.) present. No wheezing.     Comments: No increased work of breathing, no accessory muscle use, speaks in full sentences Abdominal:     General: Bowel sounds are normal. There is no distension.     Palpations: Abdomen is soft. There is no mass.     Tenderness: There is no abdominal tenderness.  Musculoskeletal: Normal range of motion.        General: No tenderness.  Lymphadenopathy:     Cervical: No cervical adenopathy.  Skin:    General: Skin is warm and dry.     Findings: No  erythema or rash.  Neurological:     Mental Status: She is alert.     Coordination: Coordination normal.  Psychiatric:        Behavior: Behavior normal.      ED Treatments / Results  Labs (all labs ordered are listed, but only abnormal results are displayed) Labs Reviewed  CBC WITH DIFFERENTIAL/PLATELET - Abnormal; Notable for the following components:      Result Value   WBC 2.7 (*)    RBC 3.05 (*)    Hemoglobin 9.3 (*)    HCT 30.8 (*)    MCV 101.0 (*)    RDW 19.2 (*)    Platelets 58 (*)    Lymphs Abs 0.3 (*)    All other components within normal limits  I-STAT CHEM 8, ED - Abnormal; Notable for the following components:   BUN 68 (*)    Creatinine, Ser 9.00 (*)    Calcium, Ion 1.10 (*)    Hemoglobin 10.2 (*)    HCT 30.0 (*)    All other components within normal limits    EKG EKG Interpretation  Date/Time:  Friday October 12 2018 12:35:11 EST Ventricular Rate:  75 PR Interval:    QRS Duration: 100 QT Interval:  390 QTC Calculation: 436 R Axis:   58 Text Interpretation:  Sinus rhythm Atrial premature complexes Probable LVH with secondary repol abnrm since last tracing no significant change Confirmed by Noemi Chapel [phone]) on 10/12/2018 12:51:11 PM   Radiology Dg Chest 2 View  Result Date: 10/12/2018 CLINICAL DATA:  Productive cough.  Chest pain. EXAM: CHEST - 2 VIEW COMPARISON:  CT 09/19/2018. FINDINGS: Mediastinum appears normal. Persistent fullness right hilum. Cardiomegaly with mild bilateral interstitial prominence and small right pleural effusion. Findings suggest CHF. Pneumonitis can not be excluded. No pneumothorax. Degenerative change thoracic spine. IMPRESSION: Cardiomegaly with mild bilateral interstitial prominence and small right pleural effusion. Findings suggest CHF. Pneumonitis could also present in this fashion. Electronically Signed   By: Marcello Moores  Register   On: 10/12/2018 13:35  Procedures Procedures (including critical care  time)  Medications Ordered in ED Medications  albuterol (PROVENTIL,VENTOLIN) solution continuous neb (10 mg/hr Nebulization New Bag/Given 10/12/18 1350)     Initial Impression / Assessment and Plan / ED Course  I have reviewed the triage vital signs and the nursing notes.  Pertinent labs & imaging results that were available during my care of the patient were reviewed by me and considered in my medical decision making (see chart for details).  Clinical Course as of Oct 12 1510  Fri Oct 12, 2018  1348 Chest x-ray shows some mild bilateral interstitial findings consistent with likely pulmonary edema.  No obvious focal infiltrates   [BM]  1500 HCT(!): 30.0 [BM]    Clinical Course User Index [BM] Noemi Chapel, MD    The patient is well-appearing, EKG shows nonspecific abnormalities, sinus rhythm, no arrhythmia.  We will check a chest x-ray to rule out pneumonia though the patient does appear very well and I anticipate inability to discharge.  Well-appearing, has chronic leukopenia, hemoglobin is at baseline, platelets are at baseline, patient has had ongoing lack of tachycardia, lack of hypoxia, well-appearing, chest x-ray without infiltrates, stable for discharge.  I have arranged outpatient dialysis for her tomorrow at 12:00.  She is agreeable and has not hyperkalemic  Final Clinical Impressions(s) / ED Diagnoses   Final diagnoses:  Cough    ED Discharge Orders         Ordered    doxycycline (VIBRAMYCIN) 100 MG capsule  2 times daily     10/12/18 1510           Noemi Chapel, MD 10/12/18 1511

## 2018-10-12 NOTE — ED Notes (Signed)
Patient verbalizes understanding of discharge instructions. Opportunity for questioning and answers were provided. 

## 2018-10-13 ENCOUNTER — Emergency Department (HOSPITAL_COMMUNITY): Payer: Medicare HMO

## 2018-10-13 ENCOUNTER — Other Ambulatory Visit: Payer: Self-pay

## 2018-10-13 ENCOUNTER — Observation Stay (HOSPITAL_COMMUNITY)
Admission: EM | Admit: 2018-10-13 | Discharge: 2018-10-18 | Disposition: A | Discharge status: Home / Self Care | Payer: Medicare HMO | Attending: Internal Medicine | Admitting: Internal Medicine

## 2018-10-13 ENCOUNTER — Encounter (HOSPITAL_COMMUNITY): Payer: Self-pay

## 2018-10-13 DIAGNOSIS — D638 Anemia in other chronic diseases classified elsewhere: Secondary | ICD-10-CM | POA: Diagnosis not present

## 2018-10-13 DIAGNOSIS — H409 Unspecified glaucoma: Secondary | ICD-10-CM | POA: Insufficient documentation

## 2018-10-13 DIAGNOSIS — R7989 Other specified abnormal findings of blood chemistry: Secondary | ICD-10-CM | POA: Diagnosis not present

## 2018-10-13 DIAGNOSIS — R079 Chest pain, unspecified: Secondary | ICD-10-CM | POA: Diagnosis not present

## 2018-10-13 DIAGNOSIS — E1121 Type 2 diabetes mellitus with diabetic nephropathy: Secondary | ICD-10-CM | POA: Diagnosis present

## 2018-10-13 DIAGNOSIS — E1140 Type 2 diabetes mellitus with diabetic neuropathy, unspecified: Secondary | ICD-10-CM | POA: Diagnosis not present

## 2018-10-13 DIAGNOSIS — Z09 Encounter for follow-up examination after completed treatment for conditions other than malignant neoplasm: Secondary | ICD-10-CM

## 2018-10-13 DIAGNOSIS — I10 Essential (primary) hypertension: Secondary | ICD-10-CM | POA: Diagnosis present

## 2018-10-13 DIAGNOSIS — N184 Chronic kidney disease, stage 4 (severe): Secondary | ICD-10-CM | POA: Diagnosis not present

## 2018-10-13 DIAGNOSIS — I208 Other forms of angina pectoris: Secondary | ICD-10-CM

## 2018-10-13 DIAGNOSIS — R4182 Altered mental status, unspecified: Secondary | ICD-10-CM | POA: Diagnosis not present

## 2018-10-13 DIAGNOSIS — K219 Gastro-esophageal reflux disease without esophagitis: Secondary | ICD-10-CM | POA: Insufficient documentation

## 2018-10-13 DIAGNOSIS — J811 Chronic pulmonary edema: Secondary | ICD-10-CM

## 2018-10-13 DIAGNOSIS — R531 Weakness: Secondary | ICD-10-CM | POA: Diagnosis not present

## 2018-10-13 DIAGNOSIS — J9691 Respiratory failure, unspecified with hypoxia: Secondary | ICD-10-CM | POA: Diagnosis present

## 2018-10-13 DIAGNOSIS — I132 Hypertensive heart and chronic kidney disease with heart failure and with stage 5 chronic kidney disease, or end stage renal disease: Secondary | ICD-10-CM | POA: Diagnosis not present

## 2018-10-13 DIAGNOSIS — R4020 Unspecified coma: Secondary | ICD-10-CM | POA: Diagnosis not present

## 2018-10-13 DIAGNOSIS — E785 Hyperlipidemia, unspecified: Secondary | ICD-10-CM | POA: Diagnosis present

## 2018-10-13 DIAGNOSIS — D649 Anemia, unspecified: Secondary | ICD-10-CM | POA: Insufficient documentation

## 2018-10-13 DIAGNOSIS — R918 Other nonspecific abnormal finding of lung field: Secondary | ICD-10-CM | POA: Diagnosis not present

## 2018-10-13 DIAGNOSIS — N186 End stage renal disease: Secondary | ICD-10-CM | POA: Insufficient documentation

## 2018-10-13 DIAGNOSIS — E8770 Fluid overload, unspecified: Secondary | ICD-10-CM | POA: Diagnosis present

## 2018-10-13 DIAGNOSIS — D61818 Other pancytopenia: Secondary | ICD-10-CM | POA: Diagnosis not present

## 2018-10-13 DIAGNOSIS — Z79899 Other long term (current) drug therapy: Secondary | ICD-10-CM | POA: Diagnosis not present

## 2018-10-13 DIAGNOSIS — I5032 Chronic diastolic (congestive) heart failure: Secondary | ICD-10-CM | POA: Diagnosis not present

## 2018-10-13 DIAGNOSIS — Z992 Dependence on renal dialysis: Secondary | ICD-10-CM

## 2018-10-13 DIAGNOSIS — R778 Other specified abnormalities of plasma proteins: Secondary | ICD-10-CM | POA: Diagnosis present

## 2018-10-13 LAB — I-STAT CHEM 8, ED
BUN: 87 mg/dL — ABNORMAL HIGH (ref 8–23)
CALCIUM ION: 1.1 mmol/L — AB (ref 1.15–1.40)
Chloride: 105 mmol/L (ref 98–111)
Creatinine, Ser: 10 mg/dL — ABNORMAL HIGH (ref 0.44–1.00)
Glucose, Bld: 140 mg/dL — ABNORMAL HIGH (ref 70–99)
HCT: 31 % — ABNORMAL LOW (ref 36.0–46.0)
Hemoglobin: 10.5 g/dL — ABNORMAL LOW (ref 12.0–15.0)
Potassium: 5 mmol/L (ref 3.5–5.1)
Sodium: 138 mmol/L (ref 135–145)
TCO2: 23 mmol/L (ref 22–32)

## 2018-10-13 LAB — CBC WITH DIFFERENTIAL/PLATELET
Abs Immature Granulocytes: 0.02 10*3/uL (ref 0.00–0.07)
Basophils Absolute: 0 10*3/uL (ref 0.0–0.1)
Basophils Relative: 0 %
Eosinophils Absolute: 0 10*3/uL (ref 0.0–0.5)
Eosinophils Relative: 0 %
HCT: 31.4 % — ABNORMAL LOW (ref 36.0–46.0)
Hemoglobin: 9.5 g/dL — ABNORMAL LOW (ref 12.0–15.0)
Immature Granulocytes: 1 %
Lymphocytes Relative: 11 %
Lymphs Abs: 0.3 10*3/uL — ABNORMAL LOW (ref 0.7–4.0)
MCH: 30.3 pg (ref 26.0–34.0)
MCHC: 30.3 g/dL (ref 30.0–36.0)
MCV: 100 fL (ref 80.0–100.0)
MONO ABS: 0.2 10*3/uL (ref 0.1–1.0)
Monocytes Relative: 5 %
Neutro Abs: 2.7 10*3/uL (ref 1.7–7.7)
Neutrophils Relative %: 83 %
Platelets: 77 10*3/uL — ABNORMAL LOW (ref 150–400)
RBC: 3.14 MIL/uL — ABNORMAL LOW (ref 3.87–5.11)
RDW: 19.1 % — ABNORMAL HIGH (ref 11.5–15.5)
WBC: 3.2 10*3/uL — AB (ref 4.0–10.5)
nRBC: 0 % (ref 0.0–0.2)

## 2018-10-13 LAB — CBC
HCT: 28.7 % — ABNORMAL LOW (ref 36.0–46.0)
Hemoglobin: 8.8 g/dL — ABNORMAL LOW (ref 12.0–15.0)
MCH: 30.4 pg (ref 26.0–34.0)
MCHC: 30.7 g/dL (ref 30.0–36.0)
MCV: 99.3 fL (ref 80.0–100.0)
Platelets: 53 10*3/uL — ABNORMAL LOW (ref 150–400)
RBC: 2.89 MIL/uL — ABNORMAL LOW (ref 3.87–5.11)
RDW: 19.2 % — ABNORMAL HIGH (ref 11.5–15.5)
WBC: 2.7 10*3/uL — ABNORMAL LOW (ref 4.0–10.5)
nRBC: 0 % (ref 0.0–0.2)

## 2018-10-13 LAB — URINALYSIS, ROUTINE W REFLEX MICROSCOPIC
BILIRUBIN URINE: NEGATIVE
Glucose, UA: 50 mg/dL — AB
Ketones, ur: NEGATIVE mg/dL
Leukocytes, UA: NEGATIVE
NITRITE: NEGATIVE
Protein, ur: 100 mg/dL — AB
Specific Gravity, Urine: 1.017 (ref 1.005–1.030)
pH: 5 (ref 5.0–8.0)

## 2018-10-13 LAB — BASIC METABOLIC PANEL
Anion gap: 18 — ABNORMAL HIGH (ref 5–15)
BUN: 87 mg/dL — ABNORMAL HIGH (ref 8–23)
CO2: 21 mmol/L — ABNORMAL LOW (ref 22–32)
Calcium: 9.3 mg/dL (ref 8.9–10.3)
Chloride: 101 mmol/L (ref 98–111)
Creatinine, Ser: 9.16 mg/dL — ABNORMAL HIGH (ref 0.44–1.00)
GFR calc Af Amer: 4 mL/min — ABNORMAL LOW (ref >60)
GFR calc non Af Amer: 4 mL/min — ABNORMAL LOW (ref >60)
Glucose, Bld: 148 mg/dL — ABNORMAL HIGH (ref 70–99)
Potassium: 5 mmol/L (ref 3.5–5.1)
Sodium: 140 mmol/L (ref 135–145)

## 2018-10-13 LAB — I-STAT CG4 LACTIC ACID, ED: Lactic Acid, Venous: 1.99 mmol/L — ABNORMAL HIGH (ref 0.5–1.9)

## 2018-10-13 LAB — I-STAT TROPONIN, ED: TROPONIN I, POC: 0.58 ng/mL — AB (ref 0.00–0.08)

## 2018-10-13 MED ORDER — ADULT MULTIVITAMIN W/MINERALS CH
1.0000 | ORAL_TABLET | Freq: Every day | ORAL | Status: DC
  Administered 2018-10-13 – 2018-10-17 (×5): 1 via ORAL
  Filled 2018-10-13 (×6): qty 1

## 2018-10-13 MED ORDER — ISOSORBIDE MONONITRATE ER 30 MG PO TB24
30.0000 mg | ORAL_TABLET | Freq: Two times a day (BID) | ORAL | Status: DC
  Administered 2018-10-13 – 2018-10-14 (×2): 30 mg via ORAL
  Filled 2018-10-13 (×2): qty 1

## 2018-10-13 MED ORDER — DIALYVITE/ZINC PO TABS: 1.0000 | ORAL_TABLET | Freq: Every day | ORAL | Status: DC

## 2018-10-13 MED ORDER — DOXERCALCIFEROL 4 MCG/2ML IV SOLN
5.0000 ug | INTRAVENOUS | Status: DC
  Administered 2018-10-15 – 2018-10-17 (×2): 5 ug via INTRAVENOUS

## 2018-10-13 MED ORDER — FERROUS SULFATE 325 (65 FE) MG PO TABS
325.0000 mg | ORAL_TABLET | Freq: Every day | ORAL | Status: DC
  Administered 2018-10-15: 325 mg via ORAL
  Filled 2018-10-13: qty 2
  Filled 2018-10-13: qty 1

## 2018-10-13 MED ORDER — CALCITRIOL 0.5 MCG PO CAPS
1.5000 ug | ORAL_CAPSULE | ORAL | Status: DC
  Administered 2018-10-15: 1.5 ug via ORAL

## 2018-10-13 MED ORDER — ACETAMINOPHEN 325 MG PO TABS
650.0000 mg | ORAL_TABLET | ORAL | Status: DC | PRN
  Administered 2018-10-15: 650 mg via ORAL
  Filled 2018-10-13: qty 2

## 2018-10-13 MED ORDER — CARVEDILOL 25 MG PO TABS: 25.0000 mg | ORAL_TABLET | Freq: Two times a day (BID) | ORAL | Status: DC

## 2018-10-13 MED ORDER — DOXERCALCIFEROL 4 MCG/2ML IV SOLN
5.0000 ug | Freq: Once | INTRAVENOUS | Status: AC
  Administered 2018-10-14: 5 ug via INTRAVENOUS

## 2018-10-13 MED ORDER — ONDANSETRON HCL 4 MG/2ML IJ SOLN
4.0000 mg | Freq: Four times a day (QID) | INTRAMUSCULAR | Status: DC | PRN
  Administered 2018-10-14: 4 mg via INTRAVENOUS

## 2018-10-13 MED ORDER — AMLODIPINE BESYLATE 10 MG PO TABS
10.0000 mg | ORAL_TABLET | Freq: Every day | ORAL | Status: DC
  Administered 2018-10-13 – 2018-10-14 (×2): 10 mg via ORAL
  Filled 2018-10-13: qty 2
  Filled 2018-10-13: qty 1

## 2018-10-13 MED ORDER — HEPARIN BOLUS VIA INFUSION
3000.0000 [IU] | Freq: Once | INTRAVENOUS | Status: AC
  Administered 2018-10-13: 3000 [IU] via INTRAVENOUS
  Filled 2018-10-13: qty 3000

## 2018-10-13 MED ORDER — SODIUM CHLORIDE 0.9 % IV SOLN
8.0000 mg/h | INTRAVENOUS | Status: DC
  Administered 2018-10-14 – 2018-10-15 (×2): 8 mg/h via INTRAVENOUS
  Filled 2018-10-13 (×7): qty 80

## 2018-10-13 MED ORDER — NITROGLYCERIN 0.4 MG SL SUBL: 0.4000 mg | SUBLINGUAL_TABLET | SUBLINGUAL | Status: DC | PRN

## 2018-10-13 MED ORDER — HEPARIN (PORCINE) 25000 UT/250ML-% IV SOLN
850.0000 [IU]/h | INTRAVENOUS | Status: DC
  Administered 2018-10-13: 850 [IU]/h via INTRAVENOUS
  Filled 2018-10-13: qty 250

## 2018-10-13 MED ORDER — ASPIRIN EC 325 MG PO TBEC
325.0000 mg | DELAYED_RELEASE_TABLET | Freq: Every day | ORAL | Status: DC
  Administered 2018-10-13 – 2018-10-18 (×6): 325 mg via ORAL
  Filled 2018-10-13 (×5): qty 1

## 2018-10-13 MED ORDER — CHLORHEXIDINE GLUCONATE CLOTH 2 % EX PADS: 6.0000 | MEDICATED_PAD | Freq: Every day | CUTANEOUS | Status: DC

## 2018-10-13 NOTE — ED Triage Notes (Signed)
Pt arrives to ED from home with complaints of generalized weakness since approx 3 days ago. EMS reports pt has missed several treatments of dialysis, last went Monday of last week (5 days ago). EMS also states patient, her husband, and house was overwhelmed with bedbugs. Patient taken to decontamination shower upon arrival to ED. Husband bedside at this time, contact precautions initiated. Pt's wig thrown in trash r/t bedbugs, family aware. Pt placed in position of comfort with bed locked and lowered, call bell in reach.

## 2018-10-13 NOTE — ED Notes (Signed)
Nurse will draw labs. 

## 2018-10-13 NOTE — ED Notes (Signed)
Pt arrived to Rm 53 via stretcher. Spouse at bedside. Pt noted to be alert. Monitor intact to pt. Dinner tray ordered.

## 2018-10-13 NOTE — Progress Notes (Signed)
Notified by bedside RN that pt has had 3 episodes of black tarry/bloody stools since 1900. Pt does endorse abdominal pain in the LLQ, RLQ, & LUQ. Pt states that the symptoms have been ongoing for the past 1 week. Stat CBC showed a drop in Hgb from 10.5 to 8.8. VSS at this time.    Assessment and Plan GI Bleed- Start protonix gtt and monitor H&H. Will transfuse for Hgb less than 8. We will consult GI as well. Multiple attempts were made to consult GI tonight through Amion and the office. I was unable to touch base with them but since pt is stable at this time we will consult in the morning   Arby Barrette AGPCNP-BC, AGNP-C [facility] Pager [phone]

## 2018-10-13 NOTE — ED Notes (Signed)
Pt having black, bloody, and runny stools with reports of LUQ, LLQ, and RLQ abdominal tenderness. No note from admitting doctor about concern for GI bleed. Pt states stools have been occurring X 1 week. Floor coverage ([place]) paged.

## 2018-10-13 NOTE — Progress Notes (Signed)
ANTICOAGULATION CONSULT NOTE - Initial Consult  Pharmacy Consult for Heparin Indication: chest pain/ACS  Allergies  Allergen Reactions  . Hydralazine Itching  . Robaxin [Methocarbamol] Other (See Comments)    Dizziness     Patient Measurements: Height: 5' 5.5" (166.4 cm) Weight: 123 lb (55.8 kg) IBW/kg (Calculated) : 58.15  Vital Signs: Temp: 98.7 F (37.1 C) (12/14 1023) Temp Source: Oral (12/14 1023) BP: 122/92 (12/14 1023) Pulse Rate: 81 (12/14 1445)  Labs: Recent Labs    10/12/18 1345 10/12/18 1351 10/13/18 1037 10/13/18 1105  HGB 9.3* 10.2* 9.5* 10.5*  HCT 30.8* 30.0* 31.4* 31.0*  PLT 58*  --  77*  --   CREATININE  --  9.00* 9.16* 10.00*    Estimated Creatinine Clearance: 4.2 mL/min (A) (by C-G formula based on SCr of 10 mg/dL (H)).   Medical History: Past Medical History:  Diagnosis Date  . DEPRESSION   . DIABETES MELLITUS, TYPE I   . GERD   . GLAUCOMA   . Headache(784.0)   . Hepatitis B carrier ([place])    05/2009: neg Hep C; Hep B: core pos, Surf neg; fatty liver US 8/10 - 7/13  . HYPERLIPIDEMIA   . HYPERTENSION   . Kidney failure    dialysis 3 times per week  . OSTEOPENIA   . POSTMENOPAUSAL STATUS   . Pulmonary edema   . Unspecified vitamin D deficiency    Assessment: 77 year old female with ESRD admitted with chest pain / positive troponins  Goal of Therapy:  Heparin level 0.3-0.7 units/ml Monitor platelets by anticoagulation protocol: Yes   Plan:  Heparin 3000 units iv bolus x 1 Heparin drip at 850 units / hr  Heparin level 8 hours after heparin begins Daily heparin level, CBC  Thank you Anette Guarneri, PharmD [phone]  01-26-202019,4:16 PM

## 2018-10-13 NOTE — Consult Note (Signed)
Renal Service Consult Note [facility]  KELCY LAIBLE [birth year] 202019 Sol Blazing Requesting Physician:  Dr Kyung Bacca  Reason for Consult:  ESRD pt w/ gen'd weakness  HPI: The patient is a 77 y.o. year-old with hx of HTN, diast CHF, hep B carrier, DM1 , depression who presents to ED w/ gen'd weakness. Missed Wed and Fri HD this week, last HD on Monday 5 days ago. Denies SOB, has been coughing, endorses fevers, n/v/d for the past few days.  No abd pain, no prod cough and no CP. Asked to see for HD.   In ED creat 10, BUn 87  K 5.0  Trop 0.5 and Hb 10.5.  CXR shows bilat opacities that is likely due to progressive CHF.  UA is benign.     ROS  denies CP  no joint pain   no HA  no blurry vision  no rash    Past Medical History  Past Medical History:  Diagnosis Date  . DEPRESSION   . DIABETES MELLITUS, TYPE I   . GERD   . GLAUCOMA   . Headache(784.0)   . Hepatitis B carrier ([place])    05/2009: neg Hep C; Hep B: core pos, Surf neg; fatty liver US 8/10 - 7/13  . HYPERLIPIDEMIA   . HYPERTENSION   . Kidney failure    dialysis 3 times per week  . OSTEOPENIA   . POSTMENOPAUSAL STATUS   . Pulmonary edema   . Unspecified vitamin D deficiency    Past Surgical History  Past Surgical History:  Procedure Laterality Date  . A/V SHUNTOGRAM N/A 09/06/2018   Procedure: A/V SHUNTOGRAM - Left AV;  Surgeon: Marty Heck, MD;  Location: [facility];  Service: Cardiovascular;  Laterality: N/A;  . AV FISTULA PLACEMENT Left 05/29/2014   Procedure: INSERTION OF ARTERIOVENOUS (AV) GORE-TEX GRAFT ARM-LEFT;  Surgeon: Angelia Mould, MD;  Location: [facility];  Service: Vascular;  Laterality: Left;  . CHOLECYSTECTOMY  02/12/07  . INSERTION OF DIALYSIS CATHETER Right 05/27/2014   Procedure: INSERTION OF DIALYSIS CATHETER;  Surgeon: Angelia Mould, MD;  Location: [facility];  Service: Vascular;  Laterality: Right;  . PERIPHERAL VASCULAR BALLOON ANGIOPLASTY Left 09/06/2018   Procedure: PERIPHERAL VASCULAR BALLOON ANGIOPLASTY;  Surgeon: Marty Heck, MD;  Location: [facility];  Service: Cardiovascular;  Laterality: Left;  arm shunt  . REFRACTIVE SURGERY  07/29/09   Dr. Bing Plume  . TONSILLECTOMY     Family History  Family History  Problem Relation Age of Onset  . Kidney disease Mother   . Coronary artery disease Other   . Diabetes Other   . Hypertension Other   . Arthritis Other   . Kidney disease Sister    Social History  reports that she has never smoked. She has never used smokeless tobacco. She reports that she does not drink alcohol or use drugs. Allergies  Allergies  Allergen Reactions  . Hydralazine Itching  . Robaxin [Methocarbamol] Other (See Comments)    Dizziness    Home medications Prior to Admission medications   Medication Sig Start Date End Date Taking? Authorizing Provider  Acetaminophen (TYLENOL PO) Take 1 tablet by mouth daily as needed (pain/headache).    [provider]  amLODipine (NORVASC) 10 MG tablet Take 10 mg by mouth daily.    [provider]  B Complex-C-Zn-Folic Acid (DIALYVITE/ZINC) TABS Take 1 tablet by mouth daily.    [provider]  calcitRIOL (ROCALTROL) 0.5 MCG capsule  Take 3 capsules (1.5 mcg total) by mouth 3 (three) times a week. 11/13/17   Jani Gravel, MD  carvedilol (COREG) 25 MG tablet TAKE 1 TABLET (25 MG TOTAL) BY MOUTH 2 (TWO) TIMES DAILY WITH A MEAL. 08/24/18   Ann Held, DO  cephALEXin (KEFLEX) 250 MG capsule Take 1 capsule (250 mg total) by mouth 2 (two) times daily. 09/28/18   Pattricia Boss, MD  doxycycline (VIBRAMYCIN) 100 MG capsule Take 1 capsule (100 mg total) by mouth 2 (two) times daily. 10/12/18   Noemi Chapel, MD  ferrous sulfate 325 (65 FE) MG tablet Take 325-650 mg by mouth daily with breakfast.    [provider]  isosorbide mononitrate (IMDUR) 30 MG 24 hr tablet TAKE 1 TABLET BY MOUTH TWICE A DAY Patient taking differently: Take 30 mg  by mouth 2 (two) times daily.  05/29/18   Ann Held, DO  Multiple Vitamin (MULTIVITAMIN WITH MINERALS) TABS tablet Take 1 tablet by mouth daily.     [provider]  nitroGLYCERIN (NITROSTAT) 0.4 MG SL tablet Place 1 tablet (0.4 mg total) under the tongue every 5 (five) minutes x 3 doses as needed for chest pain. 01/29/18   Rai, Vernelle Emerald, MD   Liver Function Tests No results for input(s): AST, ALT, ALKPHOS, BILITOT, PROT, ALBUMIN in the last 168 hours. No results for input(s): LIPASE, AMYLASE in the last 168 hours. CBC Recent Labs  Lab 10/12/18 1345 10/12/18 1351 10/13/18 1037 10/13/18 1105  WBC 2.7*  --  3.2*  --   NEUTROABS 2.1  --  2.7  --   HGB 9.3* 10.2* 9.5* 10.5*  HCT 30.8* 30.0* 31.4* 31.0*  MCV 101.0*  --  100.0  --   PLT 58*  --  77*  --    Basic Metabolic Panel Recent Labs  Lab 10/12/18 1351 10/13/18 1037 10/13/18 1105  NA 139 140 138  K 5.0 5.0 5.0  CL 105 101 105  CO2  --  21*  --   GLUCOSE 98 148* 140*  BUN 68* 87* 87*  CREATININE 9.00* 9.16* 10.00*  CALCIUM  --  9.3  --    Iron/TIBC/Ferritin/ %Sat    Component Value Date/Time   IRON 63 01/28/2018 0154   TIBC 260 01/28/2018 0154   FERRITIN 731 (H) 01/28/2018 0154   IRONPCTSAT 24 01/28/2018 0154    Vitals:   10/13/18 1400 10/13/18 1415 10/13/18 1430 10/13/18 1445  BP:      Pulse: 77 80 80 81  Resp: (!) 26 (!) 23 17 17   Temp:      TempSrc:      SpO2: 95% 97% 95% 95%  Weight:      Height:       Exam Gen chron ill appearing, pleasant and in no distress, tired No rash, cyanosis or gangrene Sclera anicteric, throat clear No jvd or bruits Chest shows bilat rales 1/3 up post w/ some wheezing exp RRR no MRG Abd soft ntnd no mass or ascites +bs GU defer MS no joint effusions or deformity Ext no LE or UE edema, no wounds or ulcers Neuro is alert, Ox 3 , nf L arm AVF loud bruit+   CXR today > bilat CHF, mild - mod severity    Home meds:  - amlodipine 10/ carvedilol 25  bid  - isosorbide mononitrate 30/ sl NTG prn      Dialysis: MWF   3.5h   400800   56.5kg   3K/2.25  bath  Hep none  LUE AVG - Mircera 150 last 12/4  - hect 5 ug tiw  - 12/9 last HD     Impression/ Plan: 1. Gen'd weakness - likely uremic from missed HD, also has sig CHF/ vol excess.  Needs HD tonight.  See orders.  2. ESRD on HD - missed HD x 2, not feeling well.  3. HTN  4. Hep B carrier    Kelly Splinter MD [employer] pager [phone]   01-27-202019, 4:16 PM

## 2018-10-13 NOTE — ED Provider Notes (Signed)
[facility] EMERGENCY DEPARTMENT Provider Note   CSN: [id number] Arrival date & time: 10/13/18  3536     History   Chief Complaint Chief Complaint  Patient presents with  . Weakness  . Emesis  . Bed Bugs    HPI [name] [name] is a 77 y.o. female.  HPI  77 year old female, with a PMH of diabetes, end-stage renal disease on dialysis, presents with complaints of generalized weakness.  Patient also complaining of chest pain which she describes as a pressure over the side of the chest.  She denies any associated shortness of breath, nausea, vomiting, diaphoresis.  Patient denies PMH of MI.  She also notes a subjective fever.  Denies any nausea, vomiting, abdominal pain, diarrhea.  Past Medical History:  Diagnosis Date  . DEPRESSION   . DIABETES MELLITUS, TYPE I   . GERD   . GLAUCOMA   . Headache(784.0)   . Hepatitis B carrier ([place])    05/2009: neg Hep C; Hep B: core pos, Surf neg; fatty liver US 8/10 - 7/13  . HYPERLIPIDEMIA   . HYPERTENSION   . Kidney failure    dialysis 3 times per week  . OSTEOPENIA   . POSTMENOPAUSAL STATUS   . Pulmonary edema   . Unspecified vitamin D deficiency     Patient Active Problem List   Diagnosis Date Noted  . AV graft malfunction (HCC) 09/03/2018  . Urinary tract infection without hematuria 08/21/2018  . Memory loss 08/21/2018  . Anemia, chronic disease 03/01/2018  . Vasovagal syncope 03/01/2018  . Macrocytic anemia 01/28/2018  . Orthostatic syncope 01/28/2018  . EKG, abnormal   . Acute metabolic encephalopathy 14/43/1540  . Influenza 11/16/2017  . Fever in adult 11/15/2017  . Demand ischemia ([place]) 11/10/2017  . Normocytic anemia 11/10/2017  . Aortic atherosclerosis ([place]) 11/10/2017  . Respiratory failure with hypoxia ([place]) 08/18/2017  . Volume overload 08/18/2017  . Dialysis patient, noncompliant ([place]) 08/17/2017  . Thumb pain, left 12/22/2016  . Chronic diastolic heart failure ([place]) 11/25/2016  . ESRD  (end stage renal disease) on dialysis ([place]) 06/25/2014  . Malignant hypertension 05/17/2014  . Chest pain 05/17/2014  . Elevated troponin 05/17/2014  . Hypertensive emergency 05/17/2014  . NSTEMI (non-ST elevated myocardial infarction) ([place]) 05/17/2014  . Chronic kidney disease, stage IV (severe) ([place]) 12/05/2013  . GERD 06/17/2009  . Hepatitis B carrier ([place]) 06/17/2009  . DEPRESSION 05/20/2009  . Unspecified glaucoma 05/20/2009  . HEADACHE 05/20/2009  . CARDIAC MURMUR 05/20/2009  . OSTEOPENIA 02/15/2008  . POSTMENOPAUSAL STATUS 12/21/2007  . Hyperlipidemia LDL goal <70 09/21/2007  . Essential hypertension 09/13/2007  . Type II diabetes mellitus with nephropathy [facility])     Past Surgical History:  Procedure Laterality Date  . A/V SHUNTOGRAM N/A 09/06/2018   Procedure: A/V SHUNTOGRAM - Left AV;  Surgeon: Marty Heck, MD;  Location: [facility] CV LAB;  Service: Cardiovascular;  Laterality: N/A;  . AV FISTULA PLACEMENT Left 05/29/2014   Procedure: INSERTION OF ARTERIOVENOUS (AV) GORE-TEX GRAFT ARM-LEFT;  Surgeon: Angelia Mould, MD;  Location: [facility];  Service: Vascular;  Laterality: Left;  . CHOLECYSTECTOMY  02/12/07  . INSERTION OF DIALYSIS CATHETER Right 05/27/2014   Procedure: INSERTION OF DIALYSIS CATHETER;  Surgeon: Angelia Mould, MD;  Location: [facility];  Service: Vascular;  Laterality: Right;  . PERIPHERAL VASCULAR BALLOON ANGIOPLASTY Left 09/06/2018   Procedure: PERIPHERAL VASCULAR BALLOON ANGIOPLASTY;  Surgeon: Marty Heck, MD;  Location: [facility] [facility] LAB;  Service:  Cardiovascular;  Laterality: Left;  arm shunt  . REFRACTIVE SURGERY  07/29/09   Dr. Bing Plume  . TONSILLECTOMY       OB History   No obstetric history on file.      Home Medications    Prior to Admission medications   Medication Sig Start Date End Date Taking? Authorizing Provider  Acetaminophen (TYLENOL PO) Take 1 tablet by mouth daily as needed (pain/headache).    [provider]  amLODipine (NORVASC) 10 MG tablet Take 10 mg by mouth daily.    [provider]  B Complex-C-Zn-Folic Acid (DIALYVITE/ZINC) TABS Take 1 tablet by mouth daily.    [provider]  calcitRIOL (ROCALTROL) 0.5 MCG capsule Take 3 capsules (1.5 mcg total) by mouth 3 (three) times a week. 11/13/17   Jani Gravel, MD  carvedilol (COREG) 25 MG tablet TAKE 1 TABLET (25 MG TOTAL) BY MOUTH 2 (TWO) TIMES DAILY WITH A MEAL. 08/24/18   Ann Held, DO  cephALEXin (KEFLEX) 250 MG capsule Take 1 capsule (250 mg total) by mouth 2 (two) times daily. 09/28/18   Pattricia Boss, MD  doxycycline (VIBRAMYCIN) 100 MG capsule Take 1 capsule (100 mg total) by mouth 2 (two) times daily. 10/12/18   Noemi Chapel, MD  ferrous sulfate 325 (65 FE) MG tablet Take 325-650 mg by mouth daily with breakfast.    [provider]  isosorbide mononitrate (IMDUR) 30 MG 24 hr tablet TAKE 1 TABLET BY MOUTH TWICE A DAY Patient taking differently: Take 30 mg by mouth 2 (two) times daily.  05/29/18   Ann Held, DO  Multiple Vitamin (MULTIVITAMIN WITH MINERALS) TABS tablet Take 1 tablet by mouth daily.     [provider]  nitroGLYCERIN (NITROSTAT) 0.4 MG SL tablet Place 1 tablet (0.4 mg total) under the tongue every 5 (five) minutes x 3 doses as needed for chest pain. 01/29/18   Mendel Corning, MD    Family History Family History  Problem Relation Age of Onset  . Kidney disease Mother   . Coronary artery disease Other   . Diabetes Other   . Hypertension Other   . Arthritis Other   . Kidney disease Sister     Social History Social History   Tobacco Use  . Smoking status: Never Smoker  . Smokeless tobacco: Never Used  . Tobacco comment: Married x's 17yrs, 6 kids-scattered [employer]. Retired Consulting civil engineer  Substance Use Topics  . Alcohol use: No  . Drug use: No     Allergies   Hydralazine and Robaxin [methocarbamol]   Review of Systems Review of Systems    Constitutional: Positive for fever (subjective). Negative for chills.  HENT: Negative for rhinorrhea and sore throat.   Eyes: Negative for visual disturbance.  Respiratory: Negative for cough and shortness of breath.   Cardiovascular: Positive for chest pain. Negative for leg swelling.  Gastrointestinal: Negative for abdominal pain, diarrhea, nausea and vomiting.  Musculoskeletal: Negative for joint swelling and neck pain.  Skin: Negative for rash and wound.  Neurological: Negative for syncope and numbness.  All other systems reviewed and are negative.    Physical Exam Updated Vital Signs BP (!) 122/92 (BP Location: Right Arm)   Pulse 85   Temp 98.7 F (37.1 C) (Oral)   Resp 19   Ht 5' 5.5" (1.664 m)   Wt 55.8 kg   SpO2 96%   BMI 20.16 kg/m   Physical Exam Vitals signs and nursing note reviewed.  Constitutional:      Appearance: She is well-developed.  HENT:     Head: Normocephalic and atraumatic.  Eyes:     Conjunctiva/sclera: Conjunctivae normal.  Neck:     Musculoskeletal: Neck supple.  Cardiovascular:     Rate and Rhythm: Normal rate and regular rhythm.     Heart sounds: Murmur present.  Pulmonary:     Effort: Pulmonary effort is normal. No tachypnea, accessory muscle usage or respiratory distress.     Breath sounds: Examination of the right-lower field reveals rales. Examination of the left-lower field reveals rales. Rales present. No wheezing.  Abdominal:     General: Bowel sounds are normal. There is no distension.     Palpations: Abdomen is soft.     Tenderness: There is no abdominal tenderness.  Musculoskeletal: Normal range of motion.        General: No tenderness or deformity.  Skin:    General: Skin is warm and dry.     Findings: No erythema or rash.       Neurological:     Mental Status: She is alert.     Cranial Nerves: Cranial nerves are intact.     Sensory: Sensation is intact.     Motor: Motor function is intact.  Psychiatric:         Behavior: Behavior normal.      ED Treatments / Results  Labs (all labs ordered are listed, but only abnormal results are displayed) Labs Reviewed  I-STAT TROPONIN, ED - Abnormal; Notable for the following components:      Result Value   Troponin i, poc 0.58 (*)    All other components within normal limits  I-STAT CG4 LACTIC ACID, ED - Abnormal; Notable for the following components:   Lactic Acid, Venous 1.99 (*)    All other components within normal limits  I-STAT CHEM 8, ED - Abnormal; Notable for the following components:   BUN 87 (*)    Creatinine, Ser 10.00 (*)    Glucose, Bld 140 (*)    Calcium, Ion 1.10 (*)    Hemoglobin 10.5 (*)    HCT 31.0 (*)    All other components within normal limits  URINE CULTURE  CBC WITH DIFFERENTIAL/PLATELET  BASIC METABOLIC PANEL  URINALYSIS, ROUTINE W REFLEX MICROSCOPIC    EKG None  Radiology Dg Chest 2 View  Result Date: 10/12/2018 CLINICAL DATA:  Productive cough.  Chest pain. EXAM: CHEST - 2 VIEW COMPARISON:  CT 09/19/2018. FINDINGS: Mediastinum appears normal. Persistent fullness right hilum. Cardiomegaly with mild bilateral interstitial prominence and small right pleural effusion. Findings suggest CHF. Pneumonitis can not be excluded. No pneumothorax. Degenerative change thoracic spine. IMPRESSION: Cardiomegaly with mild bilateral interstitial prominence and small right pleural effusion. Findings suggest CHF. Pneumonitis could also present in this fashion. Electronically Signed   By: Marcello Moores  Register   On: 10/12/2018 13:35    Procedures Procedures (including critical care time)  Medications Ordered in ED Medications - No data to display   Initial Impression / Assessment and Plan / ED Course  I have reviewed the triage vital signs and the nursing notes.  Pertinent labs & imaging results that were available during my care of the patient were reviewed by me and considered in my medical decision making (see chart for  details).     Patient presents with chest pain and a positive troponin, concerning for possible ACS.  Patient has missed dialysis however and this might also cause an increase in the troponin.  Discussed with cardiologist who will consult on the patient but recommends admission to the hospitalist.  Will discuss with the hospitalist.  No emergent need for dialysis at this time.  Potassium within normal limits.  Patient with no increased work of breathing, O2 saturations are fine.  2:25 PM Was requested discussing with nephrology.  Discussed with nephrology on-call who agreed to see the patient.  Final Clinical Impressions(s) / ED Diagnoses   Final diagnoses:  None    ED Discharge Orders    None       Etter Sjogren, PA-C 10/14/18 1150    Drenda Freeze, MD 10/16/18 1201

## 2018-10-13 NOTE — ED Notes (Addendum)
Pt had second bowel movement within the hour, third since 1900. Pt noted to have blood in diarrhea. Pt also affirmed tenderness and pain to touch in both upper abdominal quadrants and LLQ- negative for pain in LRQ. Pt informed this EMT that she has had this pain and tenderness x 2 days. Suezanne Jacquet, RN aware and will notify MD.

## 2018-10-13 NOTE — H&P (Signed)
History and Physical  [name] [name] XHB:[id number] DOB: Jan 04, 1941 DOA: July 31, 202019  Referring physician:Kendrick, Cecilie Kicks, PA-C  PCP: Ann Held, DO  Outpatient Specialists:  Patient coming from: Home & is able to ambulate   Chief Complaint: Generalized weakness nausea vomiting bedbugs  HPI: [name] [name] is a 77 y.o. female with medical history significant for diabetes end-stage renal disease on hemodialysis GERD glaucoma headache hepatitis B , depression, who presented to the hospital for evaluation of generalized weakness also she was complaining of chest pain she denies any shortness of breath nausea vomiting or diaphoresis she denies any past medical history of coronary disease or MI she does not subjective fever denies nausea vomiting abdominal pain or diarrhea.  She goes to hemodialysis Monday Wednesday and Friday but she had missed dialysis because of the chest pain.    ED Course: Troponin was done and it was positive at 0.5 there was a concern for possible acute coronary syndrome.  ED doctor discussed with cardiologist who will see patient in consultation but recommended admission to hospitalist patient was also noted to have bedbugs and she was decontaminated but her husband was not  Review of Systems: Patient seen patient seen. Pt complains of generalized weakness chest pain  Pt denies any abdominal pain diarrhea or nausea.  Review of systems are otherwise negative   Past Medical History:  Diagnosis Date  . DEPRESSION   . DIABETES MELLITUS, TYPE I   . GERD   . GLAUCOMA   . Headache(784.0)   . Hepatitis B carrier ([place])    05/2009: neg Hep C; Hep B: core pos, Surf neg; fatty liver US 8/10 - 7/13  . HYPERLIPIDEMIA   . HYPERTENSION   . Kidney failure    dialysis 3 times per week  . OSTEOPENIA   . POSTMENOPAUSAL STATUS   . Pulmonary edema   . Unspecified vitamin D deficiency    Past Surgical History:  Procedure Laterality Date  . A/V SHUNTOGRAM N/A  09/06/2018   Procedure: A/V SHUNTOGRAM - Left AV;  Surgeon: Marty Heck, MD;  Location: [place] [facility];  Service: Cardiovascular;  Laterality: N/A;  . AV FISTULA PLACEMENT Left 05/29/2014   Procedure: INSERTION OF ARTERIOVENOUS (AV) GORE-TEX GRAFT ARM-LEFT;  Surgeon: Angelia Mould, MD;  Location: [facility];  Service: Vascular;  Laterality: Left;  . CHOLECYSTECTOMY  02/12/07  . INSERTION OF DIALYSIS CATHETER Right 05/27/2014   Procedure: INSERTION OF DIALYSIS CATHETER;  Surgeon: Angelia Mould, MD;  Location: [facility];  Service: Vascular;  Laterality: Right;  . PERIPHERAL VASCULAR BALLOON ANGIOPLASTY Left 09/06/2018   Procedure: PERIPHERAL VASCULAR BALLOON ANGIOPLASTY;  Surgeon: Marty Heck, MD;  Location: [place] [facility];  Service: Cardiovascular;  Laterality: Left;  arm shunt  . REFRACTIVE SURGERY  07/29/09   Dr. Bing Plume  . TONSILLECTOMY      Social History:  reports that she has never smoked. She has never used smokeless tobacco. She reports that she does not drink alcohol or use drugs.   Allergies  Allergen Reactions  . Hydralazine Itching  . Robaxin [Methocarbamol] Other (See Comments)    Dizziness     Family History  Problem Relation Age of Onset  . Kidney disease Mother   . Coronary artery disease Other   . Diabetes Other   . Hypertension Other   . Arthritis Other   . Kidney disease Sister       Prior to Admission medications   Medication Sig Start  Date End Date Taking? Authorizing Provider  Acetaminophen (TYLENOL PO) Take 1 tablet by mouth daily as needed (pain/headache).    [provider]  amLODipine (NORVASC) 10 MG tablet Take 10 mg by mouth daily.    [provider]  B Complex-C-Zn-Folic Acid (DIALYVITE/ZINC) TABS Take 1 tablet by mouth daily.    [provider]  calcitRIOL (ROCALTROL) 0.5 MCG capsule Take 3 capsules (1.5 mcg total) by mouth 3 (three) times a week. 11/13/17   Jani Gravel, MD  carvedilol (COREG) 25  MG tablet TAKE 1 TABLET (25 MG TOTAL) BY MOUTH 2 (TWO) TIMES DAILY WITH A MEAL. 08/24/18   Ann Held, DO  cephALEXin (KEFLEX) 250 MG capsule Take 1 capsule (250 mg total) by mouth 2 (two) times daily. 09/28/18   Pattricia Boss, MD  doxycycline (VIBRAMYCIN) 100 MG capsule Take 1 capsule (100 mg total) by mouth 2 (two) times daily. 10/12/18   Noemi Chapel, MD  ferrous sulfate 325 (65 FE) MG tablet Take 325-650 mg by mouth daily with breakfast.    [provider]  isosorbide mononitrate (IMDUR) 30 MG 24 hr tablet TAKE 1 TABLET BY MOUTH TWICE A DAY Patient taking differently: Take 30 mg by mouth 2 (two) times daily.  05/29/18   Ann Held, DO  Multiple Vitamin (MULTIVITAMIN WITH MINERALS) TABS tablet Take 1 tablet by mouth daily.     [provider]  nitroGLYCERIN (NITROSTAT) 0.4 MG SL tablet Place 1 tablet (0.4 mg total) under the tongue every 5 (five) minutes x 3 doses as needed for chest pain. 01/29/18   Mendel Corning, MD    Physical Exam: BP (!) 122/92 (BP Location: Right Arm)   Pulse 81   Temp 98.7 F (37.1 C) (Oral)   Resp 17   Ht 5' 5.5" (1.664 m)   Wt 55.8 kg   SpO2 95%   BMI 20.16 kg/m   Exam:  . General: 77 y.o. year-old female well developed well nourished in no acute distress.  Alert and oriented x3. . Cardiovascular: Regular rate and rhythm with no rubs or gallops.  No thyromegaly or JVD noted.   Marland Kitchen Respiratory: Clear to auscultation with no wheezes or rales. Good inspiratory effort. . Abdomen: Soft nontender nondistended with normal bowel sounds x4 quadrants. . Musculoskeletal: No lower extremity edema. 2/4 pulses in all 4 extremities. . Skin: No ulcerative lesions noted or rashes, . Psychiatry: Mood is appropriate for condition and setting           Labs on Admission:  Basic Metabolic Panel: Recent Labs  Lab 10/12/18 1351 10/13/18 1037 10/13/18 1105  NA 139 140 138  K 5.0 5.0 5.0  CL 105 101 105  CO2  --  21*  --     GLUCOSE 98 148* 140*  BUN 68* 87* 87*  CREATININE 9.00* 9.16* 10.00*  CALCIUM  --  9.3  --    Liver Function Tests: No results for input(s): AST, ALT, ALKPHOS, BILITOT, PROT, ALBUMIN in the last 168 hours. No results for input(s): LIPASE, AMYLASE in the last 168 hours. No results for input(s): AMMONIA in the last 168 hours. CBC: Recent Labs  Lab 10/12/18 1345 10/12/18 1351 10/13/18 1037 10/13/18 1105  WBC 2.7*  --  3.2*  --   NEUTROABS 2.1  --  2.7  --   HGB 9.3* 10.2* 9.5* 10.5*  HCT 30.8* 30.0* 31.4* 31.0*  MCV 101.0*  --  100.0  --   PLT 58*  --  77*  --    Cardiac Enzymes: No results for input(s): CKTOTAL, CKMB, CKMBINDEX, TROPONINI in the last 168 hours.  BNP (last 3 results) Recent Labs    11/07/17 0015  BNP 1,397.8*    ProBNP (last 3 results) No results for input(s): PROBNP in the last 8760 hours.  CBG: No results for input(s): GLUCAP in the last 168 hours.  Radiological Exams on Admission: Dg Chest 2 View  Result Date: 10/12/2018 CLINICAL DATA:  Productive cough.  Chest pain. EXAM: CHEST - 2 VIEW COMPARISON:  CT 09/19/2018. FINDINGS: Mediastinum appears normal. Persistent fullness right hilum. Cardiomegaly with mild bilateral interstitial prominence and small right pleural effusion. Findings suggest CHF. Pneumonitis can not be excluded. No pneumothorax. Degenerative change thoracic spine. IMPRESSION: Cardiomegaly with mild bilateral interstitial prominence and small right pleural effusion. Findings suggest CHF. Pneumonitis could also present in this fashion. Electronically Signed   By: Marcello Moores  Register   On: 10/12/2018 13:35   Ct Head Wo Contrast  Result Date: October 05, 202019 CLINICAL DATA:  Generalized weakness, altered level of consciousness. EXAM: CT HEAD WITHOUT CONTRAST TECHNIQUE: Contiguous axial images were obtained from the base of the skull through the vertex without intravenous contrast. COMPARISON:  CT scan of July 20, 2016. FINDINGS: Brain: Mild  diffuse cortical atrophy is noted. Mild chronic ischemic white matter disease is noted. No mass effect or midline shift is noted. Ventricular size is within normal limits. There is no evidence of mass lesion, hemorrhage or acute infarction. Vascular: No hyperdense vessel or unexpected calcification. Skull: Normal. Negative for fracture or focal lesion. Sinuses/Orbits: No acute finding. Other: None. IMPRESSION: Mild diffuse cortical atrophy. Mild chronic ischemic white matter disease. No acute intracranial abnormality seen. Electronically Signed   By: Marijo Conception, M.D.   On: October 05, 202019 11:38   Dg Chest Port 1 View  Result Date: October 05, 202019 CLINICAL DATA:  77 year old female with a 1 week history of generalized weakness. Patient has not dialyzed since Monday 12 9. EXAM: PORTABLE CHEST 1 VIEW COMPARISON:  Prior chest x-ray 10/12/2018 FINDINGS: Stable cardiomegaly. Atherosclerotic calcifications again noted in the transverse aorta. Increasing pulmonary vascular congestion with developing patchy bilateral perihilar interstitial airspace opacities. A vascular stent overlies the soft tissues of the left upper arm. No acute osseous abnormality. IMPRESSION: 1. Increased pulmonary vascular congestion with progressive bilateral perihilar interstitial and airspace opacities. Findings most likely represent volume overload in the setting of missed dialysis. 2. Stable cardiomegaly. 3.  Aortic Atherosclerosis (ICD10-170.0) Electronically Signed   By: Jacqulynn Cadet M.D.   On: October 05, 202019 12:03    EKG: None  Assessment/Plan Present on Admission: . Type II diabetes mellitus with nephropathy ([place]) . Hyperlipidemia LDL goal <70 . Unspecified glaucoma . Essential hypertension . GERD . Chronic kidney disease, stage IV (severe) ([place]) . Malignant hypertension . Respiratory failure with hypoxia ([place]) . Elevated troponin . Chest pain . Chronic diastolic heart failure ([place]) . Anemia, chronic disease . Volume  overload  Active Problems:   Type II diabetes mellitus with nephropathy (HCC)   Hyperlipidemia LDL goal <70   Unspecified glaucoma   Essential hypertension   GERD   Chronic kidney disease, stage IV (severe) (HCC)   Malignant hypertension   Chest pain   Elevated troponin   ESRD (end stage renal disease) on dialysis (HCC)   Chronic diastolic heart failure (HCC)   Respiratory failure with hypoxia (HCC)   Volume overload   Anemia, chronic disease  1.  Chest pain rule out MI her troponin is 0.5  but this might be due to her acute renal failure on chronic renal failure..  Cardiology has been consulted they will follow patient  2.  Fluid overload secondary to not having her hemodialysis for couple of days she has history of chronic diastolic heart failure.  Nephrology has been consulted they will arrange for hemodialysis.  3.  Hypertension continue home meds  4.  Generalized weakness from debility  5.  Anemia most likely of chronic disease most likely secondary to end-stage renal disease.  6.  Bedbug patient has been decontaminated but her husband was not she is in contact isolation  Severity of Illness: The appropriate patient status for this patient is OBSERVATION. Observation status is judged to be reasonable and necessary in order to provide the required intensity of service to ensure the patient's safety. The patient's presenting symptoms, physical exam findings, and initial radiographic and laboratory data in the context of their medical condition is felt to place them at decreased risk for further clinical deterioration. Furthermore, it is anticipated that the patient will be medically stable for discharge from the hospital within 2 midnights of admission. The following factors support the patient status of observation.   " The patient's presenting symptoms include chest pain. " The physical exam findings include end-stage renal disease with elevated creatinine of 10.0. " The  initial radiographic and laboratory data are creatinine 10.0 troponin 0.5.     DVT prophylaxis: Heparin  Code Status: Full  Family Communication: Husband at bedside  Disposition Plan: Home when stable  Consults called: Nephrology and cardiology  Admission status: 24-hour observation    Cristal Deer MD [facility] Pager [phone]  If 7PM-7AM, please contact night-coverage [email or web address] Password [email or web address]  07/02/202019, 4:14 PM

## 2018-10-14 ENCOUNTER — Encounter (HOSPITAL_COMMUNITY): Payer: Self-pay | Admitting: Nephrology

## 2018-10-14 ENCOUNTER — Observation Stay (HOSPITAL_COMMUNITY): Payer: Medicare HMO

## 2018-10-14 DIAGNOSIS — J810 Acute pulmonary edema: Secondary | ICD-10-CM | POA: Diagnosis not present

## 2018-10-14 DIAGNOSIS — R7989 Other specified abnormal findings of blood chemistry: Secondary | ICD-10-CM | POA: Diagnosis not present

## 2018-10-14 DIAGNOSIS — Z992 Dependence on renal dialysis: Secondary | ICD-10-CM | POA: Diagnosis not present

## 2018-10-14 DIAGNOSIS — N186 End stage renal disease: Secondary | ICD-10-CM

## 2018-10-14 DIAGNOSIS — J811 Chronic pulmonary edema: Secondary | ICD-10-CM | POA: Diagnosis not present

## 2018-10-14 DIAGNOSIS — R079 Chest pain, unspecified: Secondary | ICD-10-CM

## 2018-10-14 DIAGNOSIS — R531 Weakness: Secondary | ICD-10-CM

## 2018-10-14 DIAGNOSIS — D638 Anemia in other chronic diseases classified elsewhere: Secondary | ICD-10-CM | POA: Diagnosis not present

## 2018-10-14 DIAGNOSIS — N19 Unspecified kidney failure: Secondary | ICD-10-CM | POA: Diagnosis not present

## 2018-10-14 LAB — RENAL FUNCTION PANEL
ANION GAP: 21 — AB (ref 5–15)
Albumin: 2.6 g/dL — ABNORMAL LOW (ref 3.5–5.0)
Albumin: 2.4 g/dL — ABNORMAL LOW (ref 3.5–5.0)
Anion gap: 14 (ref 5–15)
BUN: 102 mg/dL — ABNORMAL HIGH (ref 8–23)
BUN: 48 mg/dL — ABNORMAL HIGH (ref 8–23)
CO2: 18 mmol/L — ABNORMAL LOW (ref 22–32)
CO2: 24 mmol/L (ref 22–32)
Calcium: 8.9 mg/dL (ref 8.9–10.3)
Calcium: 8.3 mg/dL — ABNORMAL LOW (ref 8.9–10.3)
Chloride: 102 mmol/L (ref 98–111)
Chloride: 98 mmol/L (ref 98–111)
Creatinine, Ser: 9.56 mg/dL — ABNORMAL HIGH (ref 0.44–1.00)
Creatinine, Ser: 5.68 mg/dL — ABNORMAL HIGH (ref 0.44–1.00)
GFR calc Af Amer: 4 mL/min — ABNORMAL LOW (ref >60)
GFR calc Af Amer: 8 mL/min — ABNORMAL LOW (ref >60)
GFR calc non Af Amer: 4 mL/min — ABNORMAL LOW (ref >60)
GFR calc non Af Amer: 7 mL/min — ABNORMAL LOW (ref >60)
GLUCOSE: 102 mg/dL — AB (ref 70–99)
Glucose, Bld: 80 mg/dL (ref 70–99)
PHOSPHORUS: 10.2 mg/dL — AB (ref 2.5–4.6)
Phosphorus: 6.7 mg/dL — ABNORMAL HIGH (ref 2.5–4.6)
Potassium: 4.6 mmol/L (ref 3.5–5.1)
Potassium: 3.9 mmol/L (ref 3.5–5.1)
Sodium: 141 mmol/L (ref 135–145)
Sodium: 136 mmol/L (ref 135–145)

## 2018-10-14 LAB — CBC
HCT: 28.1 % — ABNORMAL LOW (ref 36.0–46.0)
HCT: 32 % — ABNORMAL LOW (ref 36.0–46.0)
Hemoglobin: 8.2 g/dL — ABNORMAL LOW (ref 12.0–15.0)
Hemoglobin: 9.5 g/dL — ABNORMAL LOW (ref 12.0–15.0)
MCH: 29 pg (ref 26.0–34.0)
MCH: 29.4 pg (ref 26.0–34.0)
MCHC: 29.2 g/dL — ABNORMAL LOW (ref 30.0–36.0)
MCHC: 29.7 g/dL — AB (ref 30.0–36.0)
MCV: 99.3 fL (ref 80.0–100.0)
MCV: 99.1 fL (ref 80.0–100.0)
PLATELETS: 54 10*3/uL — AB (ref 150–400)
Platelets: 62 10*3/uL — ABNORMAL LOW (ref 150–400)
RBC: 2.83 MIL/uL — ABNORMAL LOW (ref 3.87–5.11)
RBC: 3.23 MIL/uL — ABNORMAL LOW (ref 3.87–5.11)
RDW: 18.9 % — ABNORMAL HIGH (ref 11.5–15.5)
RDW: 19.4 % — AB (ref 11.5–15.5)
WBC: 2.5 10*3/uL — ABNORMAL LOW (ref 4.0–10.5)
WBC: 3.5 10*3/uL — ABNORMAL LOW (ref 4.0–10.5)
nRBC: 0 % (ref 0.0–0.2)
nRBC: 0 % (ref 0.0–0.2)

## 2018-10-14 LAB — URINE CULTURE: Culture: NO GROWTH

## 2018-10-14 LAB — TROPONIN I
Troponin I: 0.78 ng/mL (ref <0.03)
Troponin I: 0.77 ng/mL (ref <0.03)
Troponin I: 0.67 ng/mL (ref <0.03)

## 2018-10-14 LAB — PREPARE RBC (CROSSMATCH)

## 2018-10-14 LAB — HEPARIN LEVEL (UNFRACTIONATED): Heparin Unfractionated: >2.2 IU/mL — ABNORMAL HIGH (ref 0.30–0.70)

## 2018-10-14 MED ORDER — DOXERCALCIFEROL 4 MCG/2ML IV SOLN
INTRAVENOUS | Status: AC
  Administered 2018-10-14: 5 ug via INTRAVENOUS
  Filled 2018-10-14: qty 4

## 2018-10-14 MED ORDER — ONDANSETRON HCL 4 MG/2ML IJ SOLN
INTRAMUSCULAR | Status: AC
  Administered 2018-10-14: 4 mg via INTRAVENOUS
  Filled 2018-10-14: qty 2

## 2018-10-14 MED ORDER — CHLORHEXIDINE GLUCONATE CLOTH 2 % EX PADS: 6.0000 | MEDICATED_PAD | Freq: Every day | CUTANEOUS | Status: DC

## 2018-10-14 MED ORDER — AMLODIPINE BESYLATE 5 MG PO TABS
5.0000 mg | ORAL_TABLET | Freq: Two times a day (BID) | ORAL | Status: DC
  Administered 2018-10-15: 5 mg via ORAL
  Filled 2018-10-14: qty 1

## 2018-10-14 MED ORDER — SODIUM CHLORIDE 0.9% IV SOLUTION: Freq: Once | INTRAVENOUS | Status: DC

## 2018-10-14 NOTE — Progress Notes (Signed)
PROGRESS NOTE    [name] [name]  WPY:[id number] DOB: May 20, 1941 DOA: 08/11/2018 PCP: Ann Held, DO  Outpatient Specialists:   Brief Narrative:  [name] [name] is a 77 year old female with past medical history significant for diabetes mellitus, end-stage renal disease on hemodialysis (Monday, Wednesday and Friday), noncompliance with hemodialysis, GERD, glaucoma headache hepatitis B and depression.  Patient was admitted with what appears to be uremic syndrome, non-specific abdominal and chest pain.  Apparently, patient missed 2 sessions of hemodialysis prior to presentation.  On presentation to the hospital, BUN was 102 and creatinine 9.56.  Troponin was minimally elevated at 0.58, suspect role of renal impairment type II elevation.  Patient has undergone hemodialysis.  BUN is down to 48 and creatinine 5.68.  Patient continues to report non-specific chest and abdominal pain.  Patient is a poor historian.  We will continue to trend troponin.  Likely repeat hemodialysis in a.m.  Collateral information reports possible rectal bleed, will continue to assess, and may be related to uremic gastritis.  Will monitor hemoglobin level.  Further management will depend on hospital course.  Assessment & Plan:   Principal Problem:   Chest pain Active Problems:   Type II diabetes mellitus with nephropathy (HCC)   Hyperlipidemia LDL goal <70   Unspecified glaucoma   Essential hypertension   GERD   Chronic kidney disease, stage IV (severe) (HCC)   Malignant hypertension   Elevated troponin   ESRD (end stage renal disease) on dialysis (HCC)   Chronic diastolic heart failure (HCC)   Respiratory failure with hypoxia (HCC)   Volume overload   Anemia, chronic disease   Possible uremic syndrome/generalized weakness: -Patient has undergoing hemodialysis. -Continue to monitor closely. -Weakness could also be from significant anemia.  Monitor closely, and transfuse as deemed  necessary. -Compliant with ESRD management. -Discussed need to comply with ESRD management with patient's husband and patient.  None specific/atypical chest pain and abdominal pain: -Elevated troponin, in setting of ESRD and uremia -Possible type II troponin elevation -Trend troponin -Heparin discontinued due to concerns for possible rectal bleed -Abdominal pain could be secondary to uremic gastritis -Continues to assess and monitor patient closely -For now, patient remains stable.  Volume overload/pulmonary edema: -This has improved with hemodialysis.   -Continue to monitor closely.   -Compliant with hemodialysis management.    Hypertension:  Continue to optimize.     Anemia: Possibly multifactorial Suspect mainly related to end-stage renal disease Monitor hemoglobin level Defer anemia of CKD management to the nephrology team If significant overt rectal bleed is noted, will consult GI.  Further management depend on hospital course.   Please note, as per prior documentation- Bedbug patient has been decontaminated but her husband was not she is in contact isolation   DVT prophylaxis: SCD.  Heparin is on hold  code Status: Full code. Family Communication: Patient's husband Disposition Plan: Home eventually  Consultants:   Nephrology.  Low threshold to consult GI if significant bleeding is noted.  Procedures:   Hemodialysis  Antimicrobials:   None   Subjective: Patient continues to report atypical chest pain and abdominal pain.  Objective: Vitals:   10/14/18 1000 10/14/18 1158 10/14/18 1627 10/14/18 1639  BP: 126/74 110/62 97/79   Pulse: 72 76 75   Resp: (!) 23 (!) 23 (!) 22 19  Temp:  99.4 F (37.4 C) 99.6 F (37.6 C)   TempSrc:  Oral Oral   SpO2: 96% 94% 90%   Weight:  Height:        Intake/Output Summary (Last 24 hours) at 10/14/2018 1817 Last data filed at 10/14/2018 1755 Gross per 24 hour  Intake 291.87 ml  Output 1602 ml  Net  -1310.13 ml   Filed Weights   10/13/18 1023 10/14/18 0040 10/14/18 0436  Weight: 55.8 kg 55.8 kg 54.2 kg    Examination:  General exam: Appears calm and comfortable.  Pale. Respiratory system: Clear to auscultation. Respiratory effort normal. Cardiovascular system: S1 & S2 Gastrointestinal system: Epigastric tenderness.  Organs are difficult to assess.  Central nervous system: Alert and oriented. No focal neurological deficits.  Data Reviewed: I have personally reviewed following labs and imaging studies  CBC: Recent Labs  Lab 10/12/18 1345  10/13/18 1037 10/13/18 1105 10/13/18 2215 10/14/18 0030 10/14/18 0551  WBC 2.7*  --  3.2*  --  2.7* 2.5* 3.5*  NEUTROABS 2.1  --  2.7  --   --   --   --   HGB 9.3*   < > 9.5* 10.5* 8.8* 8.2* 9.5*  HCT 30.8*   < > 31.4* 31.0* 28.7* 28.1* 32.0*  MCV 101.0*  --  100.0  --  99.3 99.3 99.1  PLT 58*  --  77*  --  53* 54* 62*   < > = values in this interval not displayed.   Basic Metabolic Panel: Recent Labs  Lab 10/12/18 1351 10/13/18 1037 10/13/18 1105 10/14/18 0132 10/14/18 1507  NA 139 140 138 141 136  K 5.0 5.0 5.0 4.6 3.9  CL 105 101 105 102 98  CO2  --  21*  --  18* 24  GLUCOSE 98 148* 140* 102* 80  BUN 68* 87* 87* 102* 48*  CREATININE 9.00* 9.16* 10.00* 9.56* 5.68*  CALCIUM  --  9.3  --  8.9 8.3*  PHOS  --   --   --  10.2* 6.7*   GFR: Estimated Creatinine Clearance: 7.2 mL/min (A) (by C-G formula based on SCr of 5.68 mg/dL (H)). Liver Function Tests: Recent Labs  Lab 10/14/18 0132 10/14/18 1507  ALBUMIN 2.6* 2.4*   No results for input(s): LIPASE, AMYLASE in the last 168 hours. No results for input(s): AMMONIA in the last 168 hours. Coagulation Profile: No results for input(s): INR, PROTIME in the last 168 hours. Cardiac Enzymes: Recent Labs  Lab 10/14/18 1320 10/14/18 1507  TROPONINI 0.78* 0.77*   BNP (last 3 results) No results for input(s): PROBNP in the last 8760 hours. HbA1C: No results for  input(s): HGBA1C in the last 72 hours. CBG: No results for input(s): GLUCAP in the last 168 hours. Lipid Profile: No results for input(s): CHOL, HDL, LDLCALC, TRIG, CHOLHDL, LDLDIRECT in the last 72 hours. Thyroid Function Tests: No results for input(s): TSH, T4TOTAL, FREET4, T3FREE, THYROIDAB in the last 72 hours. Anemia Panel: No results for input(s): VITAMINB12, FOLATE, FERRITIN, TIBC, IRON, RETICCTPCT in the last 72 hours. Urine analysis:    Component Value Date/Time   COLORURINE YELLOW 11/30/202019 1040   APPEARANCEUR HAZY (A) 11/30/202019 1040   LABSPEC 1.017 11/30/202019 1040   PHURINE 5.0 11/30/202019 1040   GLUCOSEU 50 (A) 11/30/202019 1040   HGBUR MODERATE (A) 11/30/202019 1040   BILIRUBINUR NEGATIVE 11/30/202019 1040   BILIRUBINUR neg 08/21/2018 1145   KETONESUR NEGATIVE 11/30/202019 1040   PROTEINUR 100 (A) 11/30/202019 1040   UROBILINOGEN 0.2 08/21/2018 1145   UROBILINOGEN 2.0 (H) 04/01/2008 1751   NITRITE NEGATIVE 11/30/202019 1040   LEUKOCYTESUR NEGATIVE 11/30/202019 1040   Sepsis  Labs: @LABRCNTIP (procalcitonin:4,lacticidven:4)  ) Recent Results (from the past 240 hour(s))  Urine culture     Status: None   Collection Time: 10/13/18 10:40 AM  Result Value Ref Range Status   Specimen Description URINE, CLEAN CATCH  Final   Special Requests NONE  Final   Culture   Final    NO GROWTH Performed at [facility], [address], [place], [place] [address]    Report Status 10/14/2018 FINAL  Final         Radiology Studies: Dg Chest 2 View  Result Date: 10/14/2018 CLINICAL DATA:  Pulmonary edema, follow-up EXAM: CHEST - 2 VIEW COMPARISON:  02-Jul-202019 FINDINGS: Enlargement of cardiac silhouette. Atherosclerotic calcification aorta. Mild scattered pulmonary infiltrates consistent with improving pulmonary edema. Tiny bibasilar effusions. No pneumothorax. Diffuse osseous demineralization. Vascular stent identified LEFT upper arm. IMPRESSION: Improved pulmonary edema. Enlargement  of cardiac silhouette. Tiny bibasilar effusions. Electronically Signed   By: Lavonia Dana M.D.   On: 10/14/2018 17:34   Ct Head Wo Contrast  Result Date: 02-Jul-202019 CLINICAL DATA:  Generalized weakness, altered level of consciousness. EXAM: CT HEAD WITHOUT CONTRAST TECHNIQUE: Contiguous axial images were obtained from the base of the skull through the vertex without intravenous contrast. COMPARISON:  CT scan of July 20, 2016. FINDINGS: Brain: Mild diffuse cortical atrophy is noted. Mild chronic ischemic white matter disease is noted. No mass effect or midline shift is noted. Ventricular size is within normal limits. There is no evidence of mass lesion, hemorrhage or acute infarction. Vascular: No hyperdense vessel or unexpected calcification. Skull: Normal. Negative for fracture or focal lesion. Sinuses/Orbits: No acute finding. Other: None. IMPRESSION: Mild diffuse cortical atrophy. Mild chronic ischemic white matter disease. No acute intracranial abnormality seen. Electronically Signed   By: Marijo Conception, M.D.   On: 02-Jul-202019 11:38   Dg Chest Port 1 View  Result Date: 02-Jul-202019 CLINICAL DATA:  77 year old female with a 1 week history of generalized weakness. Patient has not dialyzed since Monday 12 9. EXAM: PORTABLE CHEST 1 VIEW COMPARISON:  Prior chest x-ray 10/12/2018 FINDINGS: Stable cardiomegaly. Atherosclerotic calcifications again noted in the transverse aorta. Increasing pulmonary vascular congestion with developing patchy bilateral perihilar interstitial airspace opacities. A vascular stent overlies the soft tissues of the left upper arm. No acute osseous abnormality. IMPRESSION: 1. Increased pulmonary vascular congestion with progressive bilateral perihilar interstitial and airspace opacities. Findings most likely represent volume overload in the setting of missed dialysis. 2. Stable cardiomegaly. 3.  Aortic Atherosclerosis (ICD10-170.0) Electronically Signed   By: Jacqulynn Cadet  M.D.   On: 02-Jul-202019 12:03        Scheduled Meds: . sodium chloride   Intravenous Once  . amLODipine  5 mg Oral BID  . aspirin EC  325 mg Oral Daily  . [START ON 10/15/2018] calcitRIOL  1.5 mcg Oral Once per day on Mon Wed Fri  . Chlorhexidine Gluconate Cloth  6 each Topical Q0600  . [START ON 10/15/2018] doxercalciferol  5 mcg Intravenous Q M,W,F-HD  . ferrous sulfate  325-650 mg Oral Q breakfast  . multivitamin with minerals  1 tablet Oral Daily   Continuous Infusions: . pantoprozole (PROTONIX) infusion 8 mg/hr (10/14/18 1755)     LOS: 0 days    Time spent: 35 minutes    Dana Allan, MD  [facility] Pager #: [phone] 7PM-7AM contact night coverage as above

## 2018-10-14 NOTE — Care Management Obs Status (Signed)
[place] NOTIFICATION   Patient Details  Name: [name] [name] MRN: [id number] Date of Birth: [birth year]   Medicare Observation Status Notification Given:  Yes    Carles Collet, RN 10/14/2018, 2:33 PM

## 2018-10-14 NOTE — Progress Notes (Addendum)
[facility] Progress Note  Subjective: feeling much better, not sure why (was dialyzed overnight). Only got 1.6 L off on HD yest, breathing better though. Bun down 48 today.   Vitals:   10/14/18 0815 10/14/18 1000 10/14/18 1158 10/14/18 1627  BP: (!) 104/43 126/74 110/62 97/79  Pulse: 70 72 76 75  Resp: 19 (!) 23 (!) 23 (!) 22  Temp:   99.4 F (37.4 C) 99.6 F (37.6 C)  TempSrc:   Oral Oral  SpO2: 94% 96% 94% 90%  Weight:      Height:        Inpatient medications: . sodium chloride   Intravenous Once  . amLODipine  10 mg Oral Daily  . aspirin EC  325 mg Oral Daily  . [START ON 10/15/2018] calcitRIOL  1.5 mcg Oral Once per day on Mon Wed Fri  . carvedilol  25 mg Oral BID WC  . Chlorhexidine Gluconate Cloth  6 each Topical Q0600  . [START ON 10/15/2018] doxercalciferol  5 mcg Intravenous Q M,W,F-HD  . ferrous sulfate  325-650 mg Oral Q breakfast  . isosorbide mononitrate  30 mg Oral BID  . multivitamin with minerals  1 tablet Oral Daily   . pantoprozole (PROTONIX) infusion 8 mg/hr (10/14/18 1200)   acetaminophen, nitroGLYCERIN, ondansetron (ZOFRAN) IV  Iron/TIBC/Ferritin/ %Sat    Component Value Date/Time   IRON 63 01/28/2018 0154   TIBC 260 01/28/2018 0154   FERRITIN 731 (H) 01/28/2018 0154   IRONPCTSAT 24 01/28/2018 0154    Exam: Gen up in bed and more alert and engaging No jvd or bruits Chest clear bilat RRR no MRG Abd soft ntnd no mass or ascites +bs Ext no LE or UE edema Neuro is alert, Ox 3 , nf L arm AVF loud bruit+  CXR today > bilat CHF, mild - mod severity   Home meds:  - amlodipine 10/ carvedilol 25 bid  - isosorbide mononitrate 30/ sl NTG prn   Dialysis: MWF   3.5h   400800   56.5kg   3K/2.25 bath  Hep none  LUE AVG  - Mircera 150 last 12/4  - hect 5 ug tiw  - 12/9 last HD  Impression/ Plan:  Gen'd weakness - uremia +vol overload from missed HD. Better after HD   Vol overload/ pulm edema - improved on exam, repeat CXR.  Under dry  ESRD on HD - needs to attend HD. HD tomorrow.   HTN - bp's soft, dc coreg,  And DC'd imdur, neg stress test 1/19  Hep B carrier   Kelly Splinter MD [facility] pager [phone]   10/14/2018, 4:32 PM   Recent Labs  Lab 10/14/18 0132 10/14/18 1507  NA 141 136  K 4.6 3.9  CL 102 98  CO2 18* 24  GLUCOSE 102* 80  BUN 102* 48*  CREATININE 9.56* 5.68*  CALCIUM 8.9 8.3*  PHOS 10.2* 6.7*  ALBUMIN 2.6* 2.4*   No results for input(s): AST, ALT, ALKPHOS, BILITOT, PROT in the last 168 hours. Recent Labs  Lab 10/12/18 1345  10/13/18 1037  10/14/18 0030 10/14/18 0551  WBC 2.7*  --  3.2*   < > 2.5* 3.5*  NEUTROABS 2.1  --  2.7  --   --   --   HGB 9.3*   < > 9.5*   < > 8.2* 9.5*  HCT 30.8*   < > 31.4*   < > 28.1* 32.0*  MCV 101.0*  --  100.0   < >  99.3 99.1  PLT 58*  --  77*   < > 54* 62*   < > = values in this interval not displayed.

## 2018-10-14 NOTE — Progress Notes (Addendum)
HD tx initiated via 15G x 2 w/o problem Pull/push/flush well w/o problem VSS (ED RN admin bp meds prior to pt coming to HD) Will continue to monitor while on HD tx

## 2018-10-14 NOTE — ED Notes (Signed)
Per admitting MD, continue with heparin drip at this time.

## 2018-10-14 NOTE — Progress Notes (Signed)
IV access issues. Heparin infusing in finger. HL >2.2. Cannot draw from other arm due to HD restrictions. Need order for foot stick if restarted. Messaged MD about IV heparin and GIB. Instructed to please stop IV heparin.  Kemal Amores S. Alford Highland, PharmD, [place] Clinical Staff Pharmacist

## 2018-10-14 NOTE — Progress Notes (Signed)
HD tx ended 22 min early @ 0358 d/t issues w/ HR and bp, ED RN admin bp meds prior to pt coming for HD tx and pt had symptomatic bp drop that resolved w/ UF off and a 200 ml NS bolus, but then started to drop again and then she had an episode of symptomatic bradycardia w/ HR down to 30, HD tx stopped, rinsed back w/ extra NS volume (400 ml), issue resolved and pt states she feels better now UF goal not met Blood rinsed back VSS at time of report Report called to Geralyn Flash, RN

## 2018-10-15 DIAGNOSIS — R079 Chest pain, unspecified: Secondary | ICD-10-CM | POA: Diagnosis not present

## 2018-10-15 DIAGNOSIS — D638 Anemia in other chronic diseases classified elsewhere: Secondary | ICD-10-CM | POA: Diagnosis not present

## 2018-10-15 DIAGNOSIS — N184 Chronic kidney disease, stage 4 (severe): Secondary | ICD-10-CM | POA: Diagnosis not present

## 2018-10-15 DIAGNOSIS — I208 Other forms of angina pectoris: Secondary | ICD-10-CM | POA: Diagnosis not present

## 2018-10-15 LAB — RENAL FUNCTION PANEL
Albumin: 2.3 g/dL — ABNORMAL LOW (ref 3.5–5.0)
Anion gap: 14 (ref 5–15)
BUN: 59 mg/dL — ABNORMAL HIGH (ref 8–23)
CO2: 25 mmol/L (ref 22–32)
Calcium: 8.5 mg/dL — ABNORMAL LOW (ref 8.9–10.3)
Chloride: 99 mmol/L (ref 98–111)
Creatinine, Ser: 6.7 mg/dL — ABNORMAL HIGH (ref 0.44–1.00)
GFR calc Af Amer: 6 mL/min — ABNORMAL LOW (ref >60)
GFR calc non Af Amer: 5 mL/min — ABNORMAL LOW (ref >60)
Glucose, Bld: 94 mg/dL (ref 70–99)
Phosphorus: 6.5 mg/dL — ABNORMAL HIGH (ref 2.5–4.6)
Potassium: 3.7 mmol/L (ref 3.5–5.1)
Sodium: 138 mmol/L (ref 135–145)

## 2018-10-15 LAB — IRON AND TIBC
Iron: 32 ug/dL (ref 28–170)
Saturation Ratios: 23 % (ref 10.4–31.8)
TIBC: 141 ug/dL — ABNORMAL LOW (ref 250–450)
UIBC: 109 ug/dL

## 2018-10-15 LAB — CBC WITH DIFFERENTIAL/PLATELET
Abs Immature Granulocytes: 0.02 10*3/uL (ref 0.00–0.07)
Basophils Absolute: 0 10*3/uL (ref 0.0–0.1)
Basophils Relative: 0 %
Eosinophils Absolute: 0 10*3/uL (ref 0.0–0.5)
Eosinophils Relative: 1 %
HCT: 25.4 % — ABNORMAL LOW (ref 36.0–46.0)
Hemoglobin: 7.7 g/dL — ABNORMAL LOW (ref 12.0–15.0)
Immature Granulocytes: 1 %
Lymphocytes Relative: 24 %
Lymphs Abs: 0.7 10*3/uL (ref 0.7–4.0)
MCH: 29.8 pg (ref 26.0–34.0)
MCHC: 30.3 g/dL (ref 30.0–36.0)
MCV: 98.4 fL (ref 80.0–100.0)
Monocytes Absolute: 0.1 10*3/uL (ref 0.1–1.0)
Monocytes Relative: 4 %
Neutro Abs: 1.9 10*3/uL (ref 1.7–7.7)
Neutrophils Relative %: 70 %
Platelets: 63 10*3/uL — ABNORMAL LOW (ref 150–400)
RBC: 2.58 MIL/uL — ABNORMAL LOW (ref 3.87–5.11)
RDW: 18.5 % — ABNORMAL HIGH (ref 11.5–15.5)
WBC: 2.8 10*3/uL — ABNORMAL LOW (ref 4.0–10.5)
nRBC: 0 % (ref 0.0–0.2)

## 2018-10-15 LAB — MAGNESIUM: Magnesium: 1.9 mg/dL (ref 1.7–2.4)

## 2018-10-15 LAB — FERRITIN: Ferritin: 3923 ng/mL — ABNORMAL HIGH (ref 11–307)

## 2018-10-15 MED ORDER — CALCITRIOL 0.5 MCG PO CAPS
ORAL_CAPSULE | ORAL | Status: AC
  Administered 2018-10-15: 1.5 ug via ORAL
  Filled 2018-10-15: qty 3

## 2018-10-15 MED ORDER — PRO-STAT SUGAR FREE PO LIQD
30.0000 mL | Freq: Two times a day (BID) | ORAL | Status: DC
  Administered 2018-10-15 – 2018-10-18 (×4): 30 mL via ORAL
  Filled 2018-10-15 (×5): qty 30

## 2018-10-15 MED ORDER — SEVELAMER CARBONATE 800 MG PO TABS
1600.0000 mg | ORAL_TABLET | Freq: Three times a day (TID) | ORAL | Status: DC
  Administered 2018-10-16 – 2018-10-18 (×3): 1600 mg via ORAL
  Filled 2018-10-15 (×7): qty 2

## 2018-10-15 MED ORDER — DOXERCALCIFEROL 4 MCG/2ML IV SOLN
INTRAVENOUS | Status: AC
  Administered 2018-10-15: 5 ug via INTRAVENOUS
  Filled 2018-10-15: qty 4

## 2018-10-15 MED ORDER — ACETAMINOPHEN 325 MG PO TABS
650.0000 mg | ORAL_TABLET | Freq: Four times a day (QID) | ORAL | Status: DC | PRN
  Administered 2018-10-16: 325 mg via ORAL
  Filled 2018-10-15: qty 2

## 2018-10-15 MED ORDER — GUAIFENESIN-DM 100-10 MG/5ML PO SYRP: 5.0000 mL | ORAL_SOLUTION | ORAL | Status: DC | PRN

## 2018-10-15 MED ORDER — DARBEPOETIN ALFA 150 MCG/0.3ML IJ SOSY
150.0000 ug | PREFILLED_SYRINGE | INTRAMUSCULAR | Status: DC
  Administered 2018-10-17: 150 ug via INTRAVENOUS
  Filled 2018-10-15: qty 0.3

## 2018-10-15 NOTE — Progress Notes (Signed)
[place] TEAM 1 - Stepdown/ICU TEAM  [name] [name]  OVZ:[id number] DOB: September 25, 1941 DOA: 2020/10/717 PCP: Ann Held, DO    Brief Narrative:  77 year old female with a hx of diabetes mellitus, ESRD on hemodialysis (Monday, Wednesday and Friday), noncompliance with hemodialysis, GERD, glaucoma, hepatitis B, and depression who was admitted with uremic syndrome after she missed 2 sessions of hemodialysis prior to presentation.  On presentation BUN was 102 and creatinine 9.56.   Subjective: The patient reports intermittent coughing.  She denies current chest pain stating it has resolved.  She denies current shortness of breath.  She reports that she feels very weak.  She has not yet been up and out of bed to a significant extent.  She reports improving appetite.  Assessment & Plan:  Uremic syndrome / generalized weakness awaiting PT/OT evals to determine if placement for therapy will be required   None specific / atypical chest pain and abdominal pain Elevated troponin, in setting of ESRD and uremia in pattern now suggestive of ACS - no sx to suggest true USAP - both abdom pain and chest discomfort have resolved w/ serial HD txs per pt report   Volume overload/pulmonary edema Due to noncompliance w/ HD - has received extra treatments and is now below EDW  Hypertension Not an issue at this time w/ pt currently hypotensive and below usual EDW   Anemia of CKD  No gross blood loss at this time - follow trend - transfuse as needed when on HD - epo per Nephrology    DVT prophylaxis: SCDs Code Status: FULL CODE Family Communication: spoke w/ daughter at bedside   Disposition Plan: stable for tele bed - PT/OT evals - mobilize - possible d/c next 24-48hrs  Consultants:  Nephrology   Antimicrobials:  None   Objective: Blood pressure (!) 95/56, pulse 71, temperature 98.9 F (37.2 C), temperature source Oral, resp. rate 19, height 5' 5.5" (1.664 m), weight 52.7 kg, SpO2 96  %.  Intake/Output Summary (Last 24 hours) at 10/15/2018 1620 Last data filed at 10/15/2018 1300 Gross per 24 hour  Intake 291.87 ml  Output 362 ml  Net -70.13 ml   Filed Weights   10/15/18 0428 10/15/18 0654 10/15/18 1048  Weight: 53.2 kg 53 kg 52.7 kg    Examination: General: No acute respiratory distress Lungs: Clear to auscultation bilaterally without wheezes or crackles Cardiovascular: Regular rate and rhythm w/ 2/6 systolic M Abdomen: Nontender, nondistended, soft, bowel sounds positive, no rebound, no ascites, no appreciable mass Extremities: No significant cyanosis, clubbing, or edema bilateral lower extremities  CBC: Recent Labs  Lab 10/12/18 1345  10/13/18 1037  10/14/18 0030 10/14/18 0551 10/15/18 0244  WBC 2.7*  --  3.2*   < > 2.5* 3.5* 2.8*  NEUTROABS 2.1  --  2.7  --   --   --  1.9  HGB 9.3*   < > 9.5*   < > 8.2* 9.5* 7.7*  HCT 30.8*   < > 31.4*   < > 28.1* 32.0* 25.4*  MCV 101.0*  --  100.0   < > 99.3 99.1 98.4  PLT 58*  --  77*   < > 54* 62* 63*   < > = values in this interval not displayed.   Basic Metabolic Panel: Recent Labs  Lab 10/14/18 0132 10/14/18 1507 10/15/18 0244  NA 141 136 138  K 4.6 3.9 3.7  CL 102 98 99  CO2 18* 24 25  GLUCOSE 102* 80 94  BUN 102* 48* 59*  CREATININE 9.56* 5.68* 6.70*  CALCIUM 8.9 8.3* 8.5*  MG  --   --  1.9  PHOS 10.2* 6.7* 6.5*   GFR: Estimated Creatinine Clearance: 5.9 mL/min (A) (by C-G formula based on SCr of 6.7 mg/dL (H)).  Liver Function Tests: Recent Labs  Lab 10/14/18 0132 10/14/18 1507 10/15/18 0244  ALBUMIN 2.6* 2.4* 2.3*    Cardiac Enzymes: Recent Labs  Lab 10/14/18 1320 10/14/18 1507 10/14/18 1808  TROPONINI 0.78* 0.77* 0.67*    HbA1C: Hgb A1c MFr Bld  Date/Time Value Ref Range Status  11/15/2017 04:31 PM 4.8 4.8 - 5.6 % Final    Comment:    (NOTE) Pre diabetes:          5.7%-6.4% Diabetes:              >6.4% Glycemic control for   <7.0% adults with diabetes   11/26/2016  04:01 AM 4.7 (L) 4.8 - 5.6 % Final    Comment:    (NOTE)         Pre-diabetes: 5.7 - 6.4         Diabetes: >6.4         Glycemic control for adults with diabetes: <7.0     Recent Results (from the past 240 hour(s))  Urine culture     Status: None   Collection Time: 10/13/18 10:40 AM  Result Value Ref Range Status   Specimen Description URINE, CLEAN CATCH  Final   Special Requests NONE  Final   Culture   Final    NO GROWTH Performed at [facility], [address], [place], [place] [address]    Report Status 10/14/2018 FINAL  Final     Scheduled Meds: . sodium chloride   Intravenous Once  . amLODipine  5 mg Oral BID  . aspirin EC  325 mg Oral Daily  . Chlorhexidine Gluconate Cloth  6 each Topical Q0600  . [START ON 10/17/2018] darbepoetin (ARANESP) injection - DIALYSIS  150 mcg Intravenous Q Wed-HD  . doxercalciferol  5 mcg Intravenous Q M,W,F-HD  . feeding supplement (PRO-STAT SUGAR FREE 64)  30 mL Oral BID  . multivitamin with minerals  1 tablet Oral Daily  . sevelamer carbonate  1,600 mg Oral TID WC     LOS: 0 days   Cherene Altes, MD [facility] Office  [phone] Pager - Text Page per Amion  If 7PM-7AM, please contact night-coverage per Amion 10/15/2018, 4:20 PM

## 2018-10-15 NOTE — Evaluation (Signed)
Physical Therapy Evaluation Patient Details Name: [name] [name] MRN: [id number] DOB: 1940/11/08 Today's Date: 10/15/2018   History of Present Illness  Pt is a 77 y.o. female admitted for chest and abdominal pain secondary to ESRD and potential uremic syndrome. CXR: pulmonary edema, cardiomegaly. PMH: HTN, HLD, T1DM, Glaucoma, Hep B carrier, ESRD - two missed HD prior to admission.   Clinical Impression  Pt admitted with above diagnosis. Pt presents with generalized weakness, cardiopulmonary impairments and cognitive impairments including inconsistencies following commands and impaired attention limiting pt's ability to perform bed mobility, transfer and amb safely and independently. Pt required modA for trunk elevation, initiation and sequencing for supine to sit, VC to maintain sitting balance with notable posterior lean and max A to sit to stand with RW. Pt BP s/p session 87/53 and symptomatic. PTA pt household amb and reliant on husband for ADLs. Pt noted to be poor historian. Pt will benefit from skilled PT to increase her independence and safety with mobility to allow discharge to SNF.       Follow Up Recommendations SNF    Equipment Recommendations  None recommended by PT    Recommendations for Other Services       Precautions / Restrictions Precautions Precautions: Fall Restrictions Weight Bearing Restrictions: No      Mobility  Bed Mobility Overal bed mobility: Needs Assistance Bed Mobility: Supine to Sit;Sit to Supine     Supine to sit: HOB elevated;Mod assist Sit to supine: Max assist;+2 for physical assistance   General bed mobility comments: Mod A for initiation of supine to sit, assist with powering trunk upright and progressing LE. Difficulty sequencing. Significant posterior lean. Max A for sit to supine - guided legs and trunk back to bed. Required 2+ assist for straightening out in bed with chuck pads. 87/53 BP s/p session.  Transfers Overall transfer  level: Needs assistance   Transfers: Sit to/from Stand Sit to Stand: Mod assist;Max assist         General transfer comment: Max A to power to stand, mod A for balance in standing. Significant posterior lean. Pre gait: 6 small steps in place. Afterwards pt stated she was dizzy and sat down.   Ambulation/Gait                Stairs            Wheelchair Mobility    Modified Rankin (Stroke Patients Only)       Balance Overall balance assessment: Needs assistance   Sitting balance-Leahy Scale: Fair Sitting balance - Comments: Posterior lean, VCs could correct and sustain for 2-3 min.      Standing balance-Leahy Scale: Poor Standing balance comment: Posterior lean, trunk flexed, required assist and B UE support.                             Pertinent Vitals/Pain Pain Assessment: No/denies pain    Home Living Family/patient expects to be discharged to:: Private residence Living Arrangements: Spouse/significant other Available Help at Discharge: Family;Available 24 hours/day Type of Home: House Home Access: Stairs to enter Entrance Stairs-Rails: Right Entrance Stairs-Number of Steps: 3 Home Layout: One level Home Equipment: Walker - 2 wheels;Walker - standard;Cane - single point;Bedside commode      Prior Function Level of Independence: Needs assistance   Gait / Transfers Assistance Needed: Short distances inside house, no AD     Comments: States she relies on husband for help with everything.  Hand Dominance        Extremity/Trunk Assessment   Upper Extremity Assessment Upper Extremity Assessment: Generalized weakness    Lower Extremity Assessment Lower Extremity Assessment: Generalized weakness       Communication   Communication: No difficulties  Cognition Arousal/Alertness: Awake/alert Behavior During Therapy: WFL for tasks assessed/performed Overall Cognitive Status: No family/caregiver present to determine baseline  cognitive functioning Area of Impairment: Orientation;Awareness;Attention;Problem solving;Following commands;Safety/judgement                 Orientation Level: Disoriented to;Time;Situation Current Attention Level: Focused   Following Commands: Follows one step commands inconsistently Safety/Judgement: Decreased awareness of safety;Decreased awareness of deficits   Problem Solving: Slow processing;Decreased initiation;Difficulty sequencing;Requires verbal cues;Requires tactile cues General Comments: A&O x 2. Unable to follow commands consistently due to distractability. Lack of awareness of where body/trunk is in space.      General Comments General comments (skin integrity, edema, etc.): Pt stated she did not have stairs to enter but pt poor historian and noted to have stairs to enter from previous admission    Exercises     Assessment/Plan    PT Assessment Patient needs continued PT services  PT Problem List Decreased strength;Decreased mobility;Decreased safety awareness;Decreased knowledge of precautions;Decreased activity tolerance;Decreased cognition;Cardiopulmonary status limiting activity;Decreased balance;Decreased knowledge of use of DME       PT Treatment Interventions Gait training;Therapeutic exercise;Therapeutic activities    PT Goals (Current goals can be found in the Care Plan section)  Acute Rehab PT Goals PT Goal Formulation: Patient unable to participate in goal setting Time For Goal Achievement: 10/29/18    Frequency Min 3X/week   Barriers to discharge        Co-evaluation               AM-PAC PT "6 Clicks" Mobility  Outcome Measure Help needed turning from your back to your side while in a flat bed without using bedrails?: A Little Help needed moving from lying on your back to sitting on the side of a flat bed without using bedrails?: A Little Help needed moving to and from a bed to a chair (including a wheelchair)?: A Lot Help needed  standing up from a chair using your arms (e.g., wheelchair or bedside chair)?: A Lot Help needed to walk in hospital room?: A Lot Help needed climbing 3-5 steps with a railing? : Total 6 Click Score: 13    End of Session Equipment Utilized During Treatment: Gait belt Activity Tolerance: Treatment limited secondary to medical complications (Comment)(hypotension/dizziness) Patient left: in bed;with call bell/phone within reach Nurse Communication: Mobility status PT Visit Diagnosis: Other abnormalities of gait and mobility (R26.89);Muscle weakness (generalized) (M62.81);Unsteadiness on feet (R26.81)    Time: 6213-0865 PT Time Calculation (min) (ACUTE ONLY): 35 min   Charges:   PT Evaluation $PT Eval Moderate Complexity: 1 Mod PT Treatments $Therapeutic Activity: 8-22 mins        Gilda Crease, SPT Acute Rehab Services [phone]  Gilda Crease 10/15/2018, 4:44 PM

## 2018-10-15 NOTE — Progress Notes (Signed)
PT Cancellation Note  Patient Details Name: [name] [name] MRN: [id number] DOB: 1941-03-05   Cancelled Treatment:    Reason Eval/Treat Not Completed: Patient at procedure or test/unavailable Pt in HD. Will follow up as time allows.   [name] [name] 10/15/2018, 9:31 AM Wray Kearns, PT, DPT Acute Rehabilitation Services Pager [phone] Office [phone]

## 2018-10-15 NOTE — Progress Notes (Addendum)
[facility] Progress Note   Subjective: Seen in HD unit. Feeling better. Some productive coughing  this morning. Repeat CXR yesterday showed improved pulm edema.   Objective Vitals:   10/15/18 0800 10/15/18 0830 10/15/18 0900 10/15/18 0930  BP: 108/64 (!) 84/57 (!) 96/59 (!) 111/34  Pulse: 67 (!) 52 62 68  Resp:      Temp:      TempSrc:      SpO2:      Weight:      Height:       Physical Exam General: Frail appearing elderly female NAD Heart: RRR Lungs: CTAB Abdomen: +bs soft NT/ND Extremities: No LE edema  Dialysis Access: LUE AVG  Dialysis Orders:  MWF  3.5h 400/800 56.5kg 3K/2.25 bath Hep none LUE AVG  - Mircera 150 last 12/4 - hect 5 ug tiw   Assessment/Plan: 1. Gen'd weakness - uremia +vol overload from missed HD. Better with HD  2. Vol overload/ pulm edema - improved on exam and on repeat CXR. Now under dry weight. Will lower at discharge.  3. ESRD - MWF. Continue on schedule  4. Anemia - Hg trending down 8.2 >9.5>7.7.Tsat 23% Ferritin 3923. Next ESA dose due 12/18.  5. HTN - BP low/stable. Amlodipine on med list. Monitor  6. MBD - Ca ok. Phos elevated. Will check OP med list and start binder here if not already on one.  7. Nutrition - Renal diet/vitamins. Add prot supp for low albumin   Lynnda Child PA-C [place] Pager [phone] 10/15/2018,10:30 AM  LOS: 0 days   Pt seen, examined and agree w A/P as above.  Kelly Splinter MD [facility] pager [phone]   10/15/2018, 11:38 AM    Additional Objective Labs: Basic Metabolic Panel: Recent Labs  Lab 10/14/18 0132 10/14/18 1507 10/15/18 0244  NA 141 136 138  K 4.6 3.9 3.7  CL 102 98 99  CO2 18* 24 25  GLUCOSE 102* 80 94  BUN 102* 48* 59*  CREATININE 9.56* 5.68* 6.70*  CALCIUM 8.9 8.3* 8.5*  PHOS 10.2* 6.7* 6.5*   CBC: Recent Labs  Lab 10/12/18 1345  10/13/18 1037  10/13/18 2215 10/14/18 0030 10/14/18 0551 10/15/18 0244   WBC 2.7*  --  3.2*  --  2.7* 2.5* 3.5* 2.8*  NEUTROABS 2.1  --  2.7  --   --   --   --  1.9  HGB 9.3*   < > 9.5*   < > 8.8* 8.2* 9.5* 7.7*  HCT 30.8*   < > 31.4*   < > 28.7* 28.1* 32.0* 25.4*  MCV 101.0*  --  100.0  --  99.3 99.3 99.1 98.4  PLT 58*  --  77*  --  53* 54* 62* 63*   < > = values in this interval not displayed.   Blood Culture    Component Value Date/Time   SDES URINE, CLEAN CATCH 05-23-2018 1040   SPECREQUEST NONE 05-23-2018 1040   CULT  05-23-2018 1040    NO GROWTH Performed at [facility], [place] [address], [place], [place] [address]    REPTSTATUS 10/14/2018 FINAL 05-23-2018 1040    Cardiac Enzymes: Recent Labs  Lab 10/14/18 1320 10/14/18 1507 10/14/18 1808  TROPONINI 0.78* 0.77* 0.67*   CBG: No results for input(s): GLUCAP in the last 168 hours. Iron Studies:  Recent Labs    10/15/18 0535  IRON 32  TIBC 141*  FERRITIN 3,923*   Lab Results  Component Value Date   INR 1.11 09/03/2018   INR 1.05 07/20/2016   INR 1.09 05/20/2014   Medications: . pantoprozole (PROTONIX) infusion 8 mg/hr (10/15/18 0315)   . sodium chloride   Intravenous Once  . amLODipine  5 mg Oral BID  . aspirin EC  325 mg Oral Daily  . calcitRIOL  1.5 mcg Oral Once per day on Mon Wed Fri  . Chlorhexidine Gluconate Cloth  6 each Topical Q0600  . doxercalciferol  5 mcg Intravenous Q M,W,F-HD  . ferrous sulfate  325-650 mg Oral Q breakfast  . multivitamin with minerals  1 tablet Oral Daily

## 2018-10-16 DIAGNOSIS — I259 Chronic ischemic heart disease, unspecified: Secondary | ICD-10-CM

## 2018-10-16 DIAGNOSIS — I5032 Chronic diastolic (congestive) heart failure: Secondary | ICD-10-CM | POA: Diagnosis not present

## 2018-10-16 DIAGNOSIS — D638 Anemia in other chronic diseases classified elsewhere: Secondary | ICD-10-CM | POA: Diagnosis not present

## 2018-10-16 DIAGNOSIS — N184 Chronic kidney disease, stage 4 (severe): Secondary | ICD-10-CM | POA: Diagnosis not present

## 2018-10-16 LAB — RENAL FUNCTION PANEL
Albumin: 2.4 g/dL — ABNORMAL LOW (ref 3.5–5.0)
Anion gap: 11 (ref 5–15)
BUN: 26 mg/dL — ABNORMAL HIGH (ref 8–23)
CO2: 28 mmol/L (ref 22–32)
Calcium: 8.9 mg/dL (ref 8.9–10.3)
Chloride: 99 mmol/L (ref 98–111)
Creatinine, Ser: 4.36 mg/dL — ABNORMAL HIGH (ref 0.44–1.00)
GFR calc Af Amer: 11 mL/min — ABNORMAL LOW (ref >60)
GFR calc non Af Amer: 9 mL/min — ABNORMAL LOW (ref >60)
Glucose, Bld: 84 mg/dL (ref 70–99)
Phosphorus: 4 mg/dL (ref 2.5–4.6)
Potassium: 4.1 mmol/L (ref 3.5–5.1)
SODIUM: 138 mmol/L (ref 135–145)

## 2018-10-16 LAB — CBC
HCT: 27.5 % — ABNORMAL LOW (ref 36.0–46.0)
Hemoglobin: 8.2 g/dL — ABNORMAL LOW (ref 12.0–15.0)
MCH: 29.3 pg (ref 26.0–34.0)
MCHC: 29.8 g/dL — ABNORMAL LOW (ref 30.0–36.0)
MCV: 98.2 fL (ref 80.0–100.0)
Platelets: 61 10*3/uL — ABNORMAL LOW (ref 150–400)
RBC: 2.8 MIL/uL — ABNORMAL LOW (ref 3.87–5.11)
RDW: 18 % — ABNORMAL HIGH (ref 11.5–15.5)
WBC: 2.7 10*3/uL — ABNORMAL LOW (ref 4.0–10.5)
nRBC: 0 % (ref 0.0–0.2)

## 2018-10-16 NOTE — Care Management Note (Addendum)
Case Management Note  Patient Details  Name: [name] [name] MRN: [id number] Date of Birth: 04-20-1941  Subjective/Objective:    Uremic syndrome, generalized weakness, atypical CP, volume overload                Action/Plan: NCM spoke to pt and husband at bedside. Offered choice for HH/CMS Medicare.gov list provided/placed on chart. Pt requested [facility] for HH. Contacted [facility] with new referral. Pt states she has RW x2, bedside commode and transport chair. Husband transport to dialysis.  Will need HHRN/PT orders with [facility].   Expected Discharge Date:                  Expected Discharge Plan:  [place]  In-House Referral:  NA  Discharge planning Services  CM Consult  Post Acute Care Choice:  Home Health Choice offered to:  Patient, Spouse  DME Arranged:  N/A DME Agency:  NA  HH Arranged:  PT, RN [place] Agency:  [place]  Status of Service:  Completed, signed off  If discussed at [place] of Stay Meetings, dates discussed:    Additional Comments:  Erenest Rasher, RN 10/16/2018, 2:47 PM

## 2018-10-16 NOTE — Progress Notes (Signed)
Physical Therapy Treatment Patient Details Name: [name] [name] MRN: [id number] DOB: 08-09-1941 Today's Date: 10/16/2018    History of Present Illness Pt is a 77 y.o. female admitted for chest and abdominal pain secondary to ESRD and potential uremic syndrome. CXR: pulmonary edema, cardiomegaly. PMH: HTN, HLD, T1DM, Glaucoma, Hep B carrier, ESRD - two missed HD prior to admission.     PT Comments    Entered room in response to chair alarm going off. Pt was in recliner and had scooted down and was sitting on the elevated legrest. Husband present but unable to keep pt from attempting to get out of recliner. Assisted pt up and stood to get back to bed. Pt needed constant reassurance that she was in the right room. Continue to recommend SNF at DC.    Follow Up Recommendations  SNF     Equipment Recommendations  None recommended by PT    Recommendations for Other Services       Precautions / Restrictions Precautions Precautions: Fall Restrictions Weight Bearing Restrictions: No    Mobility  Bed Mobility Overal bed mobility: Needs Assistance Bed Mobility: Sit to Supine       Sit to supine: Mod assist   General bed mobility comments: Mod assist to initiate return to supine.  Transfers Overall transfer level: Needs assistance Equipment used: 1 person hand held assist Transfers: Sit to/from [facility] Sit to Stand: Min assist Stand pivot transfers: Mod assist       General transfer comment: Assist to bring hips up and for balance. Stand pivot from chair to bed with mod assist due to posterior lean.  Ambulation/Gait                 Stairs             Wheelchair Mobility    Modified Rankin (Stroke Patients Only)       Balance Overall balance assessment: Needs assistance Sitting-balance support: No upper extremity supported;Feet supported Sitting balance-Leahy Scale: Fair     Standing balance support: Single extremity  supported Standing balance-Leahy Scale: Poor Standing balance comment: Min assist for static stand due to posterior lean                            Cognition Arousal/Alertness: Awake/alert Behavior During Therapy: WFL for tasks assessed/performed   Area of Impairment: Orientation;Awareness;Attention;Problem solving;Following commands;Safety/judgement                 Orientation Level: Disoriented to;Time;Situation;Place Current Attention Level: Focused   Following Commands: Follows one step commands inconsistently Safety/Judgement: Decreased awareness of safety;Decreased awareness of deficits   Problem Solving: Slow processing;Decreased initiation;Difficulty sequencing;Requires verbal cues;Requires tactile cues General Comments: Pt kept repeating that she was supposed to be upstairs and that her bed was upstairs.      Exercises      General Comments        Pertinent Vitals/Pain Pain Assessment: No/denies pain    Home Living Family/patient expects to be discharged to:: Private residence Living Arrangements: Spouse/significant other Available Help at Discharge: Family;Available 24 hours/day Type of Home: House Home Access: Stairs to enter Entrance Stairs-Rails: Right Home Layout: One level Home Equipment: Walker - 2 wheels;Walker - standard;Cane - single point;Bedside commode      Prior Function Level of Independence: Needs assistance  Gait / Transfers Assistance Needed: Short distances inside house, no AD ADL's / Homemaking Assistance Needed: spouse assists with bathing/dressing  PT Goals (current goals can now be found in the care plan section) Progress towards PT goals: Progressing toward goals    Frequency    Min 3X/week      PT Plan Current plan remains appropriate    Co-evaluation              AM-PAC PT "6 Clicks" Mobility   Outcome Measure  Help needed turning from your back to your side while in a flat bed without using  bedrails?: A Little Help needed moving from lying on your back to sitting on the side of a flat bed without using bedrails?: A Little Help needed moving to and from a bed to a chair (including a wheelchair)?: A Lot Help needed standing up from a chair using your arms (e.g., wheelchair or bedside chair)?: A Lot Help needed to walk in hospital room?: A Lot Help needed climbing 3-5 steps with a railing? : Total 6 Click Score: 13    End of Session   Activity Tolerance: Patient tolerated treatment well Patient left: in bed;with call bell/phone within reach;with bed alarm set;with family/visitor present Nurse Communication: Mobility status PT Visit Diagnosis: Other abnormalities of gait and mobility (R26.89);Muscle weakness (generalized) (M62.81);Unsteadiness on feet (R26.81)     Time: 2458-0998 PT Time Calculation (min) (ACUTE ONLY): 14 min  Charges:  $Therapeutic Activity: 8-22 mins                     [place] Pager [phone] Office [place] 10/16/2018, 10:19 AM

## 2018-10-16 NOTE — Evaluation (Signed)
Occupational Therapy Evaluation Patient Details Name: [name] [name] MRN: [id number] DOB: [birth year] Today's Date: 10/16/2018    History of Present Illness Pt is a 77 y.o. female admitted for chest and abdominal pain secondary to ESRD and potential uremic syndrome. CXR: pulmonary edema, cardiomegaly. PMH: HTN, HLD, T1DM, Glaucoma, Hep B carrier, ESRD - two missed HD prior to admission.    Clinical Impression   Pt is a 77 yo female with above diagnoses. Pt prior level of function was assisted with ADLs and IADLs by spouse at home. Pt usually ambulates with no assist device, but available at home. Pt currently presenting with generalized weakness overall requiring MaxA with bed mobility; +2 assist for ADL functional transfers with and ADL mobility taking a few steps +2 assist with MaxA for stability. Pt requiring cues for initial stance for stability. Pt tolerating session fair. Pt requires additional cues to attend to task. Family was present.  BP taken throughout session located in ADL potion of note. Pt reported dizziness during transfer, but resolved immediately in sitting. Pt would benefit from SNF due to decreased mobility, strength and overall functional ability to return home. Thank you for your referral.    Follow Up Recommendations  SNF    Equipment Recommendations  None recommended by OT    Recommendations for Other Services       Precautions / Restrictions Precautions Precautions: Fall Restrictions Weight Bearing Restrictions: No      Mobility Bed Mobility Overal bed mobility: Needs Assistance Bed Mobility: Sit to Supine     Supine to sit: Mod assist;HOB elevated Sit to supine: Mod assist   General bed mobility comments: moderate verbal cues to attend to task to scoot to edge of bed  Transfers Overall transfer level: Needs assistance Equipment used: 2 person hand held assist Transfers: Sit to/from Stand Sit to Stand: +2 physical assistance;Max assist Stand  pivot transfers: +2 physical assistance;Max assist       General transfer comment: Assist to decrease posterior lean on bed to upright standing in order to ambulate a few steps to chair    Balance Overall balance assessment: Needs assistance Sitting-balance support: No upper extremity supported;Feet supported Sitting balance-Leahy Scale: Fair Sitting balance - Comments: Posterior lean, VCs could correct and sustain for 2-3 min.  Postural control: Posterior lean Standing balance support: Single extremity supported Standing balance-Leahy Scale: Poor Standing balance comment: Min assist for static stand due to posterior lean                           ADL either performed or assessed with clinical judgement   ADL Overall ADL's : Needs assistance/impaired(spouse at home assists as needed) Eating/Feeding: Set up   Grooming: Wash/dry hands;Wash/dry face;Oral care;Applying deodorant;Set up   Upper Body Bathing: Moderate assistance;Sitting;Bed level   Lower Body Bathing: Moderate assistance;Cueing for sequencing   Upper Body Dressing : Moderate assistance;Cueing for safety   Lower Body Dressing: Moderate assistance;Cueing for safety   Toilet Transfer: Maximal assistance;+2 for physical assistance   Toileting- Clothing Manipulation and Hygiene: Maximal assistance       Functional mobility during ADLs: +2 for physical assistance General ADL Comments: sitting EOB: [birth year] (denies dizziness/lightheadedness); after approx 1-2 min: 95/57; standing: 96/70; after transfer to recliner: 105/57      Vision Baseline Vision/History: Glaucoma Vision Assessment?: No apparent visual deficits     Perception     Praxis      Pertinent Vitals/Pain Pain Assessment: No/denies  pain     Hand Dominance     Extremity/Trunk Assessment Upper Extremity Assessment Upper Extremity Assessment: Overall WFL for tasks assessed   Lower Extremity Assessment Lower Extremity Assessment: Defer  to PT evaluation   Cervical / Trunk Assessment Cervical / Trunk Assessment: Normal   Communication Communication Communication: No difficulties   Cognition Arousal/Alertness: Awake/alert Behavior During Therapy: WFL for tasks assessed/performed Overall Cognitive Status: History of cognitive impairments - at baseline Area of Impairment: Orientation;Awareness;Problem solving;Following commands;Safety/judgement                 Orientation Level: Disoriented to;Time;Situation;Place Current Attention Level: Focused   Following Commands: Follows one step commands consistently;Follows one step commands with increased time Safety/Judgement: Decreased awareness of safety;Decreased awareness of deficits   Problem Solving: Slow processing;Decreased initiation;Difficulty sequencing;Requires verbal cues;Requires tactile cues General Comments: Pt requiring cues to attend to task   General Comments       Exercises     Shoulder Instructions      Home Living Family/patient expects to be discharged to:: Private residence Living Arrangements: Spouse/significant other Available Help at Discharge: Family;Available 24 hours/day Type of Home: House Home Access: Stairs to enter [employer] of Steps: 3 Entrance Stairs-Rails: Right Home Layout: One level     Bathroom Shower/Tub: Occupational psychologist: Standard     Home Equipment: Environmental consultant - 2 wheels;Walker - standard;Cane - single point;Bedside commode          Prior Functioning/Environment Level of Independence: Needs assistance  Gait / Transfers Assistance Needed: Short distances inside house, no AD ADL's / Homemaking Assistance Needed: spouse assists with bathing/dressing            OT Problem List: Decreased strength;Decreased activity tolerance;Decreased safety awareness;Increased edema;Impaired balance (sitting and/or standing)      OT Treatment/Interventions: Self-care/ADL training;Therapeutic  exercise;Energy conservation;Therapeutic activities;Balance training    OT Goals(Current goals can be found in the care plan section) Acute Rehab OT Goals Patient Stated Goal: Wants to return home OT Goal Formulation: With patient Time For Goal Achievement: 10/16/18 Potential to Achieve Goals: Fair ADL Goals Pt Will Perform Upper Body Bathing: with min assist Pt Will Transfer to Toilet: with min assist Additional ADL Goal #1: Pt will perform ADL functional transfers with MinA.  OT Frequency: Min 2X/week   Barriers to D/C:            Co-evaluation              AM-PAC OT "6 Clicks" Daily Activity     Outcome Measure Help from another person eating meals?: A Little Help from another person taking care of personal grooming?: A Little Help from another person toileting, which includes using toliet, bedpan, or urinal?: A Lot Help from another person bathing (including washing, rinsing, drying)?: A Lot Help from another person to put on and taking off regular upper body clothing?: A Lot Help from another person to put on and taking off regular lower body clothing?: A Lot 6 Click Score: 14   End of Session Equipment Utilized During Treatment: Gait belt Nurse Communication: Mobility status  Activity Tolerance: No increased pain;Patient limited by fatigue Patient left: in bed;with call bell/phone within reach;with chair alarm set;with family/visitor present  OT Visit Diagnosis: Unsteadiness on feet (R26.81);Muscle weakness (generalized) (M62.81)                Time: 4098-1191 OT Time Calculation (min): 26 min Charges:  OT General Charges $OT Visit: 1 Visit OT Evaluation $  OT Eval Moderate Complexity: 1 Mod OT Treatments $Therapeutic Activity: 8-22 mins  Darryl Nestle) Marsa Aris OTR/L Acute Rehabilitation Services Pager: [phone] Office: [phone] Fredda Hammed 10/16/2018, 10:53 AM

## 2018-10-16 NOTE — Clinical Social Work Note (Signed)
Clinical Social Work Assessment  Patient Details  Name: [name] [name] MRN: [id number] Date of Birth: 03-Jul-1941  Date of referral:  10/16/18               Reason for consult:  Facility Placement, Discharge Planning                Permission sought to share information with:  Family Supports Permission granted to share information::  Yes, Verbal Permission Granted  Name::     Dody Smartt  Agency::     Relationship::  spouse  Contact Information:  [phone]  Housing/Transportation Living arrangements for the past 2 months:  [place] of Information:  Patient, Spouse Patient Interpreter Needed:  None Criminal Activity/Legal Involvement Pertinent to Current Situation/Hospitalization:  No - Comment as needed Significant Relationships:  Spouse, Adult Children Lives with:  Spouse Do you feel safe going back to the place where you live?  Yes Need for family participation in patient care:  Yes (Comment)  Care giving concerns: Patient from home with spouse. Dialysis MWF. PT recommending SNF.   Social Worker assessment / plan: CSW met with patient and spouse at bedside. Patient alert and oriented. Spouse present and helping patient with lunch. CSW introduced self and role and discussed disposition planning - PT recommendation for SNF.   Patient and spouse discussed and decided to decline SNF. Patient would rather return home at discharge. Spouse drives patient to dialysis and helps patient with some things at home. They reported that patient normally ambulates independently and is able to dress and bathe herself.   Patient would require an authorization through [facility] if plan changes to SNF; did inform patient and spouse of this. CSW referred to [facility] for home health needs. Will sign off, as no additional needs identified. Please re-consult if disposition plan changes or additional needs arise.   Employment status:  Retired Forensic scientist:  Managed  Medicare([employer]) PT Recommendations:  Success / Referral to community resources:     Patient/Family's Response to care: Patient and spouse appreciative of care.  Patient/Family's Understanding of and Emotional Response to Diagnosis, Current Treatment, and Prognosis: Patient and spouse with understanding of patient's condition and care needs. They decline SNF and patient will return home at discharge.  Emotional Assessment Appearance:  Appears stated age Attitude/Demeanor/Rapport:  Engaged Affect (typically observed):  Accepting, Calm, Appropriate, Pleasant Orientation:  Oriented to Self, Oriented to Place, Oriented to  Time, Oriented to Situation Alcohol / Substance use:  Not Applicable Psych involvement (Current and /or in the community):  No (Comment)  Discharge Needs  Concerns to be addressed:  Discharge Planning Concerns, Care Coordination Readmission within the last 30 days:  No Current discharge risk:  Physical Impairment Barriers to Discharge:  Continued Medical Work up   Estanislado Emms, LCSW 10/16/2018, 12:59 PM

## 2018-10-16 NOTE — Progress Notes (Addendum)
[facility] Progress Note   Subjective:  Seen in room working with PT. No c/os this am.    Objective Vitals:   10/16/18 0001 10/16/18 0415 10/16/18 0759 10/16/18 1148  BP: (!) 102/44 91/60 118/68 (!) 136/91  Pulse: 68 70 60 76  Resp:  (!) 25 (!) 25 (!) 21  Temp: 98.9 F (37.2 C) 98.1 F (36.7 C) 98.6 F (37 C) 97.7 F (36.5 C)  TempSrc: Oral Oral Oral Oral  SpO2: 98% 96% 98% 100%  Weight:  55.8 kg    Height:       Physical Exam General: Frail appearing elderly female NAD Heart: RRR Lungs: CTAB Abdomen: +bs soft NT/ND Extremities: No LE edema  Dialysis Access: LUE AVG  Dialysis Orders:  MWF  3.5h 400/800 56.5kg 3K/2.25 bath Hep none LUE AVG  - Mircera 150 last 12/4 - hect 5 ug tiw   Assessment/Plan: 1. Gen'd weakness - uremia +vol overload from missed HD. Better with HD. PT recommending SNF placement.  2. Vol overload/ pulm edema - improved on exam and on repeat CXR. Now under dry weight. Will lower at discharge. Post HD wt on 12/16 52.7kg  3. ESRD - MWF. Continue on schedule  4. Anemia - Hg 8.2 .Tsat 23% Ferritin 3923. High Ferritin prohibits IV Fe. Next ESA dose due 12/18.  5. HTN - BP low/stable. Amlodipine on med list. Monitor  6. MBD - Ca ok. Phos elevated. On Auryxia as outpatient. Will hold d/t ^^^ferritin. Will start Renvela 1600 tid qac.  7. Nutrition - Renal diet/vitamins. Add prot supp for low albumin   Lynnda Child PA-C [facility] Pager [phone] 10/16/2018,12:05 PM  LOS: 0 days   Pt seen, examined and agree w A/P as above.  Kelly Splinter MD [facility] pager [phone]   10/16/2018, 3:07 PM     Additional Objective Labs: Basic Metabolic Panel: Recent Labs  Lab 10/14/18 1507 10/15/18 0244 10/16/18 0331  NA 136 138 138  K 3.9 3.7 4.1  CL 98 99 99  CO2 24 25 28   GLUCOSE 80 94 84  BUN 48* 59* 26*  CREATININE 5.68* 6.70* 4.36*  CALCIUM 8.3* 8.5* 8.9  PHOS 6.7* 6.5* 4.0    CBC: Recent Labs  Lab 10/12/18 1345  10/13/18 1037  10/13/18 2215 10/14/18 0030 10/14/18 0551 10/15/18 0244 10/16/18 0331  WBC 2.7*  --  3.2*  --  2.7* 2.5* 3.5* 2.8* 2.7*  NEUTROABS 2.1  --  2.7  --   --   --   --  1.9  --   HGB 9.3*   < > 9.5*   < > 8.8* 8.2* 9.5* 7.7* 8.2*  HCT 30.8*   < > 31.4*   < > 28.7* 28.1* 32.0* 25.4* 27.5*  MCV 101.0*  --  100.0  --  99.3 99.3 99.1 98.4 98.2  PLT 58*  --  77*  --  53* 54* 62* 63* 61*   < > = values in this interval not displayed.   Blood Culture    Component Value Date/Time   SDES URINE, CLEAN CATCH 06-Aug-202019 1040   SPECREQUEST NONE 06-Aug-202019 1040   CULT  06-Aug-202019 1040    NO GROWTH Performed at [facility], [place] [address] [place], [place] [address]    REPTSTATUS 10/14/2018 FINAL 06-Aug-202019 1040    Cardiac Enzymes: Recent Labs  Lab 10/14/18 1320 10/14/18 1507 10/14/18 1808  TROPONINI 0.78* 0.77* 0.67*   CBG: No results for  input(s): GLUCAP in the last 168 hours. Iron Studies:  Recent Labs    10/15/18 0535  IRON 32  TIBC 141*  FERRITIN 3,923*   Lab Results  Component Value Date   INR 1.11 09/03/2018   INR 1.05 07/20/2016   INR 1.09 05/20/2014   Medications:  . aspirin EC  325 mg Oral Daily  . Chlorhexidine Gluconate Cloth  6 each Topical Q0600  . [START ON 10/17/2018] darbepoetin (ARANESP) injection - DIALYSIS  150 mcg Intravenous Q Wed-HD  . doxercalciferol  5 mcg Intravenous Q M,W,F-HD  . feeding supplement (PRO-STAT SUGAR FREE 64)  30 mL Oral BID  . multivitamin with minerals  1 tablet Oral Daily  . sevelamer carbonate  1,600 mg Oral TID WC

## 2018-10-16 NOTE — NC FL2 (Signed)
[place] LEVEL OF CARE SCREENING TOOL     IDENTIFICATION  Patient Name: [name] [name] Birthdate: Jan 17, 1941 Sex: female Admission Date (Current Location): May 04, 202019  [facility] and [place] Number:  Herbalist and Address:  [facility] [place], [place] [address], [place], [place] [address]      Provider Number: [id number]  Attending Physician Name and Address:  Elwyn Reach, MD  Relative Name and Phone Number:  Magdelyn Roebuck, spouse, [phone]    Current Level of Care: Hospital Recommended Level of Care: [place] Prior Approval Number:    Date Approved/Denied:   PASRR Number: [id number]  Discharge Plan: SNF    Current Diagnoses: Patient Active Problem List   Diagnosis Date Noted  . AV graft malfunction (HCC) 09/03/2018  . Urinary tract infection without hematuria 08/21/2018  . Memory loss 08/21/2018  . Anemia, chronic disease 03/01/2018  . Vasovagal syncope 03/01/2018  . Macrocytic anemia 01/28/2018  . Orthostatic syncope 01/28/2018  . EKG, abnormal   . Acute metabolic encephalopathy 79/39/0300  . Influenza 11/16/2017  . Fever in adult 11/15/2017  . Demand ischemia ([place]) 11/10/2017  . Normocytic anemia 11/10/2017  . Aortic atherosclerosis ([place]) 11/10/2017  . Respiratory failure with hypoxia ([place]) 08/18/2017  . Volume overload 08/18/2017  . Dialysis patient, noncompliant ([place]) 08/17/2017  . Thumb pain, left 12/22/2016  . Chronic diastolic heart failure ([place]) 11/25/2016  . ESRD (end stage renal disease) on dialysis ([place]) 06/25/2014  . Malignant hypertension 05/17/2014  . Chest pain 05/17/2014  . Elevated troponin 05/17/2014  . Hypertensive emergency 05/17/2014  . NSTEMI (non-ST elevated myocardial infarction) ([place]) 05/17/2014  . Chronic kidney disease, stage IV (severe) ([place]) 12/05/2013  . GERD 06/17/2009  . Hepatitis B carrier ([place]) 06/17/2009  . DEPRESSION 05/20/2009  . Unspecified glaucoma  05/20/2009  . HEADACHE 05/20/2009  . CARDIAC MURMUR 05/20/2009  . OSTEOPENIA 02/15/2008  . POSTMENOPAUSAL STATUS 12/21/2007  . Hyperlipidemia LDL goal <70 09/21/2007  . Essential hypertension 09/13/2007  . Type II diabetes mellitus with nephropathy (HCC)     Orientation RESPIRATION BLADDER Height & Weight     Self, Time, Situation, Place  Normal Continent Weight: 55.8 kg Height:  5' 5.5" (166.4 cm)  BEHAVIORAL SYMPTOMS/MOOD NEUROLOGICAL BOWEL NUTRITION STATUS      Continent Diet(please see DC summary)  AMBULATORY STATUS COMMUNICATION OF NEEDS Skin   Extensive Assist Verbally Normal                       Personal Care Assistance Level of Assistance  Bathing, Feeding, Dressing Bathing Assistance: Limited assistance Feeding assistance: Independent Dressing Assistance: Limited assistance     Functional Limitations Info  Sight, Hearing, Speech Sight Info: Adequate Hearing Info: Adequate Speech Info: Adequate    SPECIAL CARE FACTORS FREQUENCY  PT (By licensed PT), OT (By licensed OT)     PT Frequency: 5x/week OT Frequency: 5x/week            Contractures Contractures Info: Not present    Additional Factors Info  Code Status, Allergies Code Status Info: Full Allergies Info: Hydralazine, Robaxin Methocarbamol           Current Medications (10/16/2018):  This is the current hospital active medication list Current Facility-Administered Medications  Medication Dose Route Frequency Provider Last Rate Last Dose  . acetaminophen (TYLENOL) tablet 650 mg  650 mg Oral Q6H PRN Cherene Altes, MD      . aspirin EC tablet 325  mg  325 mg Oral Daily Cristal Deer, MD   325 mg at 10/16/18 1048  . Chlorhexidine Gluconate Cloth 2 % PADS 6 each  6 each Topical Q0600 Roney Jaffe, MD      . Derrill Memo ON 10/17/2018] Darbepoetin Alfa (ARANESP) injection 150 mcg  150 mcg Intravenous Q Wed-HD Lynnda Child, PA-C      . doxercalciferol (HECTOROL) injection 5 mcg  5  mcg Intravenous Q M,W,F-HD Roney Jaffe, MD   5 mcg at 10/15/18 1007  . feeding supplement (PRO-STAT SUGAR FREE 64) liquid 30 mL  30 mL Oral BID Lynnda Child, PA-C   30 mL at 10/16/18 1049  . guaiFENesin-dextromethorphan (ROBITUSSIN DM) 100-10 MG/5ML syrup 5 mL  5 mL Oral Q4H PRN Cherene Altes, MD      . multivitamin with minerals tablet 1 tablet  1 tablet Oral Daily Cristal Deer, MD   1 tablet at 10/16/18 1048  . nitroGLYCERIN (NITROSTAT) SL tablet 0.4 mg  0.4 mg Sublingual Q5 Min x 3 PRN Cristal Deer, MD      . ondansetron [facility]) injection 4 mg  4 mg Intravenous Q6H PRN Cristal Deer, MD   4 mg at 10/14/18 0242  . sevelamer carbonate (RENVELA) tablet 1,600 mg  1,600 mg Oral TID WC Lynnda Child, PA-C   1,600 mg at 10/16/18 1048     Discharge Medications: Please see discharge summary for a list of discharge medications.  Relevant Imaging Results:  Relevant Lab Results:   Additional Information SSN: [id number] . dialysis MWF  Estanislado Emms, LCSW

## 2018-10-16 NOTE — Progress Notes (Signed)
Patient ID: [name] [name], female   DOB: 1941-03-23, 77 y.o.   MRN: [id number]  PROGRESS NOTE    [name] [name]  IZT:[id number] DOB: 1941/03/28 DOA: 07-09-202019 PCP: Ann Held, DO    Brief Narrative:  77 year old female with a hx of diabetesmellitus,ESRD on hemodialysis(Monday, Wednesday and Friday), noncompliance with hemodialysis,GERD,glaucoma, hepatitis B, anddepression who was admitted with uremic syndrome after she missed 2 sessions of hemodialysis prior to presentation.  Patient has been seen and dialyzed already yesterday.   Assessment & Plan:   Principal Problem:   Chest pain Active Problems:   Type II diabetes mellitus with nephropathy (HCC)   Hyperlipidemia LDL goal <70   Unspecified glaucoma   Essential hypertension   GERD   Chronic kidney disease, stage IV (severe) (HCC)   Malignant hypertension   Elevated troponin   ESRD (end stage renal disease) on dialysis (HCC)   Chronic diastolic heart failure (HCC)   Respiratory failure with hypoxia (HCC)   Volume overload   Anemia, chronic disease   #1 chest pain: Nonspecific in nature.  Appears to have resolved.  Most likely secondary to uremia.  Currently stable.  #2 end-stage renal disease with weakness: Continue per nephrology.  Probably to be dialyzed on Wednesday and was discharged home.  #3 chronic diastolic heart failure: Improved and resolved with hemodialysis.  #4 hypertension: Blood pressure is better controlled at the moment.  Continue management.  #5 anemia of chronic disease: Continue per nephrology.    DVT prophylaxis: SCD Code Status: Full Family Communication: Husband at bedside Disposition Plan: Home  Consultants:   Nephrology  Procedures:   None   Antimicrobials:   None   Subjective: Patient sitting in the chair with no new complaints.  She is breathing better with no significant respiratory distress.  No nausea vomiting or diarrhea.  Objective: Vitals:   10/16/18 0759 10/16/18 1148 10/16/18 1627 10/16/18 2033  BP: 118/68 (!) 136/91 (!) 134/91 117/61  Pulse: 60 76 72 76  Resp: (!) 25 (!) 21  16  Temp: 98.6 F (37 C) 97.7 F (36.5 C) 98.6 F (37 C) 99.2 F (37.3 C)  TempSrc: Oral Oral Oral Oral  SpO2: 98% 100% 93% 96%  Weight:      Height:        Intake/Output Summary (Last 24 hours) at 10/16/2018 2105 Last data filed at 10/16/2018 1233 Gross per 24 hour  Intake 942 ml  Output -  Net 942 ml   Filed Weights   10/15/18 0654 10/15/18 1048 10/16/18 0415  Weight: 53 kg 52.7 kg 55.8 kg    Examination:  General exam: Appears calm and comfortable  Respiratory system: Clear to auscultation. Respiratory effort normal. Cardiovascular system: S1 & S2 heard, RRR. No JVD, murmurs, rubs, gallops or clicks. No pedal edema. Gastrointestinal system: Abdomen is nondistended, soft and nontender. No organomegaly or masses felt. Normal bowel sounds heard. Central nervous system: Alert and oriented. No focal neurological deficits. Extremities: Symmetric 5 x 5 power. Skin: No rashes, lesions or ulcers Psychiatry: Judgement and insight appear normal. Mood & affect appropriate.     Data Reviewed: I have personally reviewed following labs and imaging studies  CBC: Recent Labs  Lab 10/12/18 1345  10/13/18 1037  10/13/18 2215 10/14/18 0030 10/14/18 0551 10/15/18 0244 10/16/18 0331  WBC 2.7*  --  3.2*  --  2.7* 2.5* 3.5* 2.8* 2.7*  NEUTROABS 2.1  --  2.7  --   --   --   --  1.9  --   HGB 9.3*   < > 9.5*   < > 8.8* 8.2* 9.5* 7.7* 8.2*  HCT 30.8*   < > 31.4*   < > 28.7* 28.1* 32.0* 25.4* 27.5*  MCV 101.0*  --  100.0  --  99.3 99.3 99.1 98.4 98.2  PLT 58*  --  77*  --  53* 54* 62* 63* 61*   < > = values in this interval not displayed.   Basic Metabolic Panel: Recent Labs  Lab 10/13/18 1037 10/13/18 1105 10/14/18 0132 10/14/18 1507 10/15/18 0244 10/16/18 0331  NA 140 138 141 136 138 138  K 5.0 5.0 4.6 3.9 3.7 4.1  CL 101 105 102 98  99 99  CO2 21*  --  18* 24 25 28   GLUCOSE 148* 140* 102* 80 94 84  BUN 87* 87* 102* 48* 59* 26*  CREATININE 9.16* 10.00* 9.56* 5.68* 6.70* 4.36*  CALCIUM 9.3  --  8.9 8.3* 8.5* 8.9  MG  --   --   --   --  1.9  --   PHOS  --   --  10.2* 6.7* 6.5* 4.0   GFR: Estimated Creatinine Clearance: 9.7 mL/min (A) (by C-G formula based on SCr of 4.36 mg/dL (H)). Liver Function Tests: Recent Labs  Lab 10/14/18 0132 10/14/18 1507 10/15/18 0244 10/16/18 0331  ALBUMIN 2.6* 2.4* 2.3* 2.4*   No results for input(s): LIPASE, AMYLASE in the last 168 hours. No results for input(s): AMMONIA in the last 168 hours. Coagulation Profile: No results for input(s): INR, PROTIME in the last 168 hours. Cardiac Enzymes: Recent Labs  Lab 10/14/18 1320 10/14/18 1507 10/14/18 1808  TROPONINI 0.78* 0.77* 0.67*   BNP (last 3 results) No results for input(s): PROBNP in the last 8760 hours. HbA1C: No results for input(s): HGBA1C in the last 72 hours. CBG: No results for input(s): GLUCAP in the last 168 hours. Lipid Profile: No results for input(s): CHOL, HDL, LDLCALC, TRIG, CHOLHDL, LDLDIRECT in the last 72 hours. Thyroid Function Tests: No results for input(s): TSH, T4TOTAL, FREET4, T3FREE, THYROIDAB in the last 72 hours. Anemia Panel: Recent Labs    10/15/18 0535  FERRITIN 3,923*  TIBC 141*  IRON 32   Sepsis Labs: Recent Labs  Lab 10/13/18 1049  LATICACIDVEN 1.99*    Recent Results (from the past 240 hour(s))  Urine culture     Status: None   Collection Time: 10/13/18 10:40 AM  Result Value Ref Range Status   Specimen Description URINE, CLEAN CATCH  Final   Special Requests NONE  Final   Culture   Final    NO GROWTH Performed at [facility], [place] [address] [place], [place] [address]    Report Status 10/14/2018 FINAL  Final         Radiology Studies: No results found.      Scheduled Meds: . aspirin EC  325 mg Oral Daily  . Chlorhexidine Gluconate Cloth  6 each  Topical Q0600  . [START ON 10/17/2018] darbepoetin (ARANESP) injection - DIALYSIS  150 mcg Intravenous Q Wed-HD  . doxercalciferol  5 mcg Intravenous Q M,W,F-HD  . feeding supplement (PRO-STAT SUGAR FREE 64)  30 mL Oral BID  . multivitamin with minerals  1 tablet Oral Daily  . sevelamer carbonate  1,600 mg Oral TID WC   Continuous Infusions:   LOS: 0 days    Time spent: 32 minutes    Mustapha Colson,LAWAL, MD [facility] Pager [phone]  If 7PM-7AM, please contact night-coverage [email or web address] Password [email or web address] 10/16/2018, 9:05 PM

## 2018-10-17 DIAGNOSIS — N184 Chronic kidney disease, stage 4 (severe): Secondary | ICD-10-CM | POA: Diagnosis not present

## 2018-10-17 DIAGNOSIS — D638 Anemia in other chronic diseases classified elsewhere: Secondary | ICD-10-CM | POA: Diagnosis not present

## 2018-10-17 DIAGNOSIS — I5032 Chronic diastolic (congestive) heart failure: Secondary | ICD-10-CM | POA: Diagnosis not present

## 2018-10-17 DIAGNOSIS — I259 Chronic ischemic heart disease, unspecified: Secondary | ICD-10-CM | POA: Diagnosis not present

## 2018-10-17 LAB — RENAL FUNCTION PANEL
ANION GAP: 13 (ref 5–15)
Albumin: 2.6 g/dL — ABNORMAL LOW (ref 3.5–5.0)
BUN: 45 mg/dL — ABNORMAL HIGH (ref 8–23)
CHLORIDE: 99 mmol/L (ref 98–111)
CO2: 25 mmol/L (ref 22–32)
Calcium: 10 mg/dL (ref 8.9–10.3)
Creatinine, Ser: 6.07 mg/dL — ABNORMAL HIGH (ref 0.44–1.00)
GFR calc Af Amer: 7 mL/min — ABNORMAL LOW (ref >60)
GFR calc non Af Amer: 6 mL/min — ABNORMAL LOW (ref >60)
Glucose, Bld: 84 mg/dL (ref 70–99)
POTASSIUM: 4.3 mmol/L (ref 3.5–5.1)
Phosphorus: 4.7 mg/dL — ABNORMAL HIGH (ref 2.5–4.6)
Sodium: 137 mmol/L (ref 135–145)

## 2018-10-17 LAB — CBC
HEMATOCRIT: 28.5 % — AB (ref 36.0–46.0)
HEMOGLOBIN: 8.7 g/dL — AB (ref 12.0–15.0)
MCH: 29.9 pg (ref 26.0–34.0)
MCHC: 30.5 g/dL (ref 30.0–36.0)
MCV: 97.9 fL (ref 80.0–100.0)
Platelets: 80 10*3/uL — ABNORMAL LOW (ref 150–400)
RBC: 2.91 MIL/uL — ABNORMAL LOW (ref 3.87–5.11)
RDW: 17.6 % — ABNORMAL HIGH (ref 11.5–15.5)
WBC: 2.9 10*3/uL — ABNORMAL LOW (ref 4.0–10.5)
nRBC: 0 % (ref 0.0–0.2)

## 2018-10-17 MED ORDER — SODIUM CHLORIDE 0.9 % IV SOLN: 100.0000 mL | INTRAVENOUS | Status: DC | PRN

## 2018-10-17 MED ORDER — DOXERCALCIFEROL 4 MCG/2ML IV SOLN
INTRAVENOUS | Status: AC
  Filled 2018-10-17: qty 4

## 2018-10-17 MED ORDER — ALTEPLASE 2 MG IJ SOLR: 2.0000 mg | Freq: Once | INTRAMUSCULAR | Status: DC | PRN

## 2018-10-17 MED ORDER — HEPARIN SODIUM (PORCINE) 1000 UNIT/ML DIALYSIS
1000.0000 [IU] | INTRAMUSCULAR | Status: DC | PRN
  Filled 2018-10-17: qty 1

## 2018-10-17 MED ORDER — DARBEPOETIN ALFA 150 MCG/0.3ML IJ SOSY
PREFILLED_SYRINGE | INTRAMUSCULAR | Status: AC
  Filled 2018-10-17: qty 0.3

## 2018-10-17 NOTE — Progress Notes (Signed)
Occupational Therapy Treatment Patient Details Name: [name] [name] MRN: [id number] DOB: 09-15-1941 Today's Date: 10/17/2018    History of present illness Pt is a 77 y.o. female admitted for chest and abdominal pain secondary to ESRD and potential uremic syndrome. CXR: pulmonary edema, cardiomegaly. PMH: HTN, HLD, T1DM, Glaucoma, Hep B carrier, ESRD - two missed HD prior to admission.    OT comments  Pt asking assist to go to bathroom to OTR upon arrival. Pt lying supine in bed, performing bed mobility with Mod A for trunk and lower body movements. Pt performing scooting to EOB with multiple verbal cues to attend to task. Pt performing STS with ModA and hand held assist taking 2-3 steps to transfer to [facility]. Pt requiring placement for BUEs to arm chairs for stability and sequencing. Pt able to manage LB hygiene in sitting with set-upA. Pt performing STS with increased stability from [facility] to bed and able to scoot self to top of bed. Pt requiring set-upA for feeding as she has assist at home with all ADLs and no caregiver in room at this time. Pt left in bed with alarms and call bell within reach. Pt continues to progress, but limited by pain, fatigue and decreased attention. Pt would greatly benefit from continued OT skilled services in SNF setting for ADLs, mobility, safety and education.   Follow Up Recommendations  SNF    Equipment Recommendations  None recommended by OT    Recommendations for Other Services      Precautions / Restrictions Precautions Precautions: Fall Restrictions Weight Bearing Restrictions: No       Mobility Bed Mobility Overal bed mobility: Needs Assistance Bed Mobility: Sit to Supine     Supine to sit: Mod assist;HOB elevated Sit to supine: Mod assist   General bed mobility comments: moderate verbal cues to attend to task to scoot to edge of bed  Transfers Overall transfer level: Needs assistance Equipment used: 2 person hand held assist Transfers: Sit  to/from Stand Sit to Stand: Max assist Stand pivot transfers: Max assist       General transfer comment: Assist to decrease posterior lean on bed to upright standing in order to ambulate a few steps to chair    Balance Overall balance assessment: Needs assistance Sitting-balance support: No upper extremity supported;Feet supported   Sitting balance - Comments: Posterior lean, VCs could correct and sustain for 2-3 min.  Postural control: Posterior lean Standing balance support: Single extremity supported                               ADL either performed or assessed with clinical judgement   ADL Overall ADL's : Needs assistance/impaired(spouse at home assists as needed) Eating/Feeding: Set up           Lower Body Bathing: Cueing for sequencing;Maximal assistance   Upper Body Dressing : Moderate assistance;Cueing for safety   Lower Body Dressing: Cueing for safety;Maximal assistance   Toilet Transfer: Maximal assistance;Stand-pivot   Toileting- Clothing Manipulation and Hygiene: Maximal assistance       Functional mobility during ADLs: +2 for physical assistance General ADL Comments: sitting EOB: 84/56 (denies dizziness/lightheadedness); after approx 1-2 min: 95/57; standing: 96/70; after transfer to recliner: 105/57      Vision Baseline Vision/History: Glaucoma Vision Assessment?: No apparent visual deficits   Perception     Praxis      Cognition Arousal/Alertness: Awake/alert Behavior During Therapy: WFL for tasks assessed/performed Overall  Cognitive Status: History of cognitive impairments - at baseline Area of Impairment: Orientation;Awareness;Problem solving;Following commands;Safety/judgement                 Orientation Level: Disoriented to;Time;Situation;Place Current Attention Level: Focused   Following Commands: Follows one step commands consistently;Follows one step commands with increased time Safety/Judgement: Decreased  awareness of safety;Decreased awareness of deficits   Problem Solving: Slow processing;Decreased initiation;Difficulty sequencing;Requires verbal cues;Requires tactile cues General Comments: Pt requiring cues to attend to task        Exercises     Shoulder Instructions       General Comments      Pertinent Vitals/ Pain       Pain Assessment: No/denies pain  Home Living Family/patient expects to be discharged to:: Private residence Living Arrangements: Spouse/significant other Available Help at Discharge: Family;Available 24 hours/day Type of Home: House Home Access: Stairs to enter [employer] of Steps: 3 Entrance Stairs-Rails: Right Home Layout: One level     Bathroom Shower/Tub: Occupational psychologist: Standard     Home Equipment: Environmental consultant - 2 wheels;Walker - standard;Cane - single point;Bedside commode          Prior Functioning/Environment Level of Independence: Needs assistance  Gait / Transfers Assistance Needed: Short distances inside house, no AD ADL's / Homemaking Assistance Needed: spouse assists with bathing/dressing       Frequency  Min 2X/week        Progress Toward Goals  OT Goals(current goals can now be found in the care plan section)     Acute Rehab OT Goals Patient Stated Goal: Wants to return home OT Goal Formulation: With patient Time For Goal Achievement: 10/16/18 Potential to Achieve Goals: Fair  Plan      Co-evaluation                 AM-PAC OT "6 Clicks" Daily Activity     Outcome Measure   Help from another person eating meals?: A Little Help from another person taking care of personal grooming?: A Little Help from another person toileting, which includes using toliet, bedpan, or urinal?: A Lot Help from another person bathing (including washing, rinsing, drying)?: A Lot Help from another person to put on and taking off regular upper body clothing?: A Lot Help from another person to put on  and taking off regular lower body clothing?: A Lot 6 Click Score: 14    End of Session Equipment Utilized During Treatment: Gait belt  OT Visit Diagnosis: Unsteadiness on feet (R26.81);Muscle weakness (generalized) (M62.81)   Activity Tolerance No increased pain;Patient limited by fatigue   Patient Left in bed;with call bell/phone within reach;with chair alarm set;with family/visitor present   Nurse Communication Mobility status        Time: 3888-2800 OT Time Calculation (min): 24 min  Charges: OT General Charges $OT Visit: 1 Visit OT Treatments $Self Care/Home Management : 23-37 mins  Ebony Hail Harold Hedge) Marsa Aris OTR/L Acute Rehabilitation Services Pager: [phone] Office: [phone]   Fredda Hammed 10/17/2018, 2:05 PM

## 2018-10-17 NOTE — Progress Notes (Addendum)
PROGRESS NOTE  [name] [name] STM:[id number] DOB: [birth year] DOA: Jun 07, 202019 PCP: Ann Held, DO  HPI/Recap of past 8 hours: 77 year old female witha hx ofdiabetesmellitus,ESRDon hemodialysis(Monday, Wednesday and Friday), noncompliance with hemodialysis,GERD,glaucoma,hepatitis B,anddepressionwhowas admitted with uremic syndromeafter shemissed 2 sessions of hemodialysis prior to presentation.  Patient has been seen and dialyzed already yesterday.  10/17/2018: Patient seen and examined at bedside.  No acute events overnight.  Reports persistent generalized weakness.  PT assessed and recommended SNF.  HD planned today.  Assessment/Plan: Principal Problem:   Chest pain Active Problems:   Type II diabetes mellitus with nephropathy (HCC)   Hyperlipidemia LDL goal <70   Unspecified glaucoma   Essential hypertension   GERD   Chronic kidney disease, stage IV (severe) (HCC)   Malignant hypertension   Elevated troponin   ESRD (end stage renal disease) on dialysis (HCC)   Chronic diastolic heart failure (HCC)   Respiratory failure with hypoxia (HCC)   Volume overload   Anemia, chronic disease  Chest pain rule out ACS Opponent peaked at 0.78 and trended down Chest pain has resolved  Volume overload/pulmonary edema secondary to noncompliance with dialysis Continue dialysis Nephrology following Will be hemodialyzed today 10/17/2018  Hypertension Maintain map greater than 65 Hold amlodipine  Anemia of chronic disease secondary to end-stage renal disease Baseline hemoglobin 9.5 Hemoglobin 8.7 today No sign of overt bleeding Nephrology following  Chronic pancytopenia Appears stable WBC 2.9 and platelet 80 Continue to monitor  Ambulatory dysfunction/physical debility PT recommend SNF Fall precautions    DVT prophylaxis: SCD.  Not on VTE pharmacological therapy due to thrombocytopenia Code Status: Full Family Communication:  None at  bedside Disposition Plan:  SNF possibly 1 to 2 days when nephrology signs of.  Consultants:   Nephrology  Procedures:   None   Antimicrobials:   None    Objective: Vitals:   10/17/18 1500 10/17/18 1530 10/17/18 1600 10/17/18 1630  BP: (!) 86/50 (!) 106/59 (!) 99/48 (!) 99/55  Pulse: 81 79 78 70  Resp:      Temp:      TempSrc:      SpO2:      Weight:      Height:        Intake/Output Summary (Last 24 hours) at 10/17/2018 1705 Last data filed at 10/17/2018 0900 Gross per 24 hour  Intake 410 ml  Output -  Net 410 ml   Filed Weights   10/15/18 1048 10/16/18 0415 10/17/18 1335  Weight: 52.7 kg 55.8 kg 52.7 kg    Exam:  . General: 77 y.o. year-old female well developed well nourished in no acute distress.  Weak appearing.  Alert and interactive. . Cardiovascular: Regular rate and rhythm with no rubs or gallops.  No thyromegaly or JVD noted.   Marland Kitchen Respiratory: Clear to auscultation with no wheezes or rales. Good inspiratory effort. . Abdomen: Soft nontender nondistended with normal bowel sounds x4 quadrants. . Musculoskeletal: No lower extremity edema. 2/4 pulses in all 4 extremities. . Skin: No ulcerative lesions noted or rashes, . Psychiatry: Mood is appropriate for condition and setting   Data Reviewed: CBC: Recent Labs  Lab 10/12/18 1345  10/13/18 1037  10/14/18 0030 10/14/18 0551 10/15/18 0244 10/16/18 0331 10/17/18 1313  WBC 2.7*  --  3.2*   < > 2.5* 3.5* 2.8* 2.7* 2.9*  NEUTROABS 2.1  --  2.7  --   --   --  1.9  --   --   HGB 9.3*   < >  9.5*   < > 8.2* 9.5* 7.7* 8.2* 8.7*  HCT 30.8*   < > 31.4*   < > 28.1* 32.0* 25.4* 27.5* 28.5*  MCV 101.0*  --  100.0   < > 99.3 99.1 98.4 98.2 97.9  PLT 58*  --  77*   < > 54* 62* 63* 61* 80*   < > = values in this interval not displayed.   Basic Metabolic Panel: Recent Labs  Lab 10/14/18 0132 10/14/18 1507 10/15/18 0244 10/16/18 0331 10/17/18 1313  NA 141 136 138 138 137  K 4.6 3.9 3.7 4.1 4.3  CL  102 98 99 99 99  CO2 18* 24 25 28 25   GLUCOSE 102* 80 94 84 84  BUN 102* 48* 59* 26* 45*  CREATININE 9.56* 5.68* 6.70* 4.36* 6.07*  CALCIUM 8.9 8.3* 8.5* 8.9 10.0  MG  --   --  1.9  --   --   PHOS 10.2* 6.7* 6.5* 4.0 4.7*   GFR: Estimated Creatinine Clearance: 6.6 mL/min (A) (by C-G formula based on SCr of 6.07 mg/dL (H)). Liver Function Tests: Recent Labs  Lab 10/14/18 0132 10/14/18 1507 10/15/18 0244 10/16/18 0331 10/17/18 1313  ALBUMIN 2.6* 2.4* 2.3* 2.4* 2.6*   No results for input(s): LIPASE, AMYLASE in the last 168 hours. No results for input(s): AMMONIA in the last 168 hours. Coagulation Profile: No results for input(s): INR, PROTIME in the last 168 hours. Cardiac Enzymes: Recent Labs  Lab 10/14/18 1320 10/14/18 1507 10/14/18 1808  TROPONINI 0.78* 0.77* 0.67*   BNP (last 3 results) No results for input(s): PROBNP in the last 8760 hours. HbA1C: No results for input(s): HGBA1C in the last 72 hours. CBG: No results for input(s): GLUCAP in the last 168 hours. Lipid Profile: No results for input(s): CHOL, HDL, LDLCALC, TRIG, CHOLHDL, LDLDIRECT in the last 72 hours. Thyroid Function Tests: No results for input(s): TSH, T4TOTAL, FREET4, T3FREE, THYROIDAB in the last 72 hours. Anemia Panel: Recent Labs    10/15/18 0535  FERRITIN 3,923*  TIBC 141*  IRON 32   Urine analysis:    Component Value Date/Time   COLORURINE YELLOW 03/18/2018 1040   APPEARANCEUR HAZY (A) 03/18/2018 1040   LABSPEC 1.017 03/18/2018 1040   PHURINE 5.0 03/18/2018 1040   GLUCOSEU 50 (A) 03/18/2018 1040   HGBUR MODERATE (A) 03/18/2018 1040   BILIRUBINUR NEGATIVE 03/18/2018 1040   BILIRUBINUR neg 08/21/2018 1145   KETONESUR NEGATIVE 03/18/2018 1040   PROTEINUR 100 (A) 03/18/2018 1040   UROBILINOGEN 0.2 08/21/2018 1145   UROBILINOGEN 2.0 (H) 04/01/2008 1751   NITRITE NEGATIVE 03/18/2018 1040   LEUKOCYTESUR NEGATIVE 03/18/2018 1040   Sepsis  Labs: @LABRCNTIP (procalcitonin:4,lacticidven:4)  ) Recent Results (from the past 240 hour(s))  Urine culture     Status: None   Collection Time: 10/13/18 10:40 AM  Result Value Ref Range Status   Specimen Description URINE, CLEAN CATCH  Final   Special Requests NONE  Final   Culture   Final    NO GROWTH Performed at [facility], [place] [address], [place], [place] [address]    Report Status 10/14/2018 FINAL  Final      Studies: No results found.  Scheduled Meds: . aspirin EC  325 mg Oral Daily  . Chlorhexidine Gluconate Cloth  6 each Topical Q0600  . darbepoetin (ARANESP) injection - DIALYSIS  150 mcg Intravenous Q Wed-HD  . doxercalciferol  5 mcg Intravenous Q M,W,F-HD  . feeding supplement (PRO-STAT SUGAR FREE 64)  30 mL Oral BID  . multivitamin with minerals  1 tablet Oral Daily  . sevelamer carbonate  1,600 mg Oral TID WC    Continuous Infusions: . [START ON 10/18/2018] sodium chloride    . [START ON 10/18/2018] sodium chloride       LOS: 0 days     Kayleen Memos, MD [facility] Pager [phone]  If 7PM-7AM, please contact night-coverage [email or web address] Password [email or web address] 10/17/2018, 5:05 PM

## 2018-10-17 NOTE — Progress Notes (Addendum)
[facility] Progress Note   Subjective:  Seen in room. No c/os. Denies CP/SOB/N/VD. For HD today.    Objective Vitals:   10/16/18 2033 10/16/18 2235 10/17/18 0412 10/17/18 0757  BP: 117/61  129/70 (!) 158/94  Pulse: 76  72 79  Resp: 16  16   Temp: 99.2 F (37.3 C) 98.2 F (36.8 C) 98.2 F (36.8 C) 98.1 F (36.7 C)  TempSrc: Oral Oral Oral Oral  SpO2: 96%  99% 99%  Weight:      Height:       Physical Exam General: Frail appearing elderly female NAD Heart: RRR 3/6 SEM  Lungs: CTAB Abdomen: +bs soft NT/ND Extremities: No LE edema  Dialysis Access: LUE AVG  Dialysis Orders:  [place] MWF  3.5h 400/800 56.5kg 3K/2.25 bath Hep none LUE AVG  - Mircera 150 last 12/4 - hect 5 ug tiw   Assessment/Plan: 1. Gen'd weakness - uremia +vol overload from missed HD. Better with HD. PT recommending SNF placement but family declined.  2. Vol overload/ pulm edema - improved on exam and on repeat CXR. Now under dry weight. Will lower at discharge. Post HD wt on 12/16 52.7kg  3. ESRD - MWF. Continue on schedule. HD today  4. Anemia - Hg 8.2 .Tsat 23% Ferritin 3923. High Ferritin prohibits IV Fe. Next ESA dose due 12/18.  5. HTN - BP stable. Amlodipine on med list. Monitor  6. MBD - Ca ok. Phos elevated. On Auryxia as outpatient. Will hold d/t ^^^ferritin. Will start Renvela 1600 tid qac.  7. Nutrition - Renal diet/vitamins. Add prot supp for low albumin  8. Dispo - stable for d/c from renal standpoint   Lynnda Child PA-C [facility] Pager [phone] 10/17/2018,10:01 AM  LOS: 0 days   Pt seen, examined and agree w A/P as above. Stable for d/c from renal standpoint Kelly Splinter MD [facility] pager [phone]   10/17/2018, 11:01 AM    Additional Objective Labs: Basic Metabolic Panel: Recent Labs  Lab 10/14/18 1507 10/15/18 0244 10/16/18 0331  NA 136 138 138  K 3.9 3.7 4.1  CL 98 99 99  CO2 24 25 28   GLUCOSE  80 94 84  BUN 48* 59* 26*  CREATININE 5.68* 6.70* 4.36*  CALCIUM 8.3* 8.5* 8.9  PHOS 6.7* 6.5* 4.0   CBC: Recent Labs  Lab 10/12/18 1345  10/13/18 1037  10/13/18 2215 10/14/18 0030 10/14/18 0551 10/15/18 0244 10/16/18 0331  WBC 2.7*  --  3.2*  --  2.7* 2.5* 3.5* 2.8* 2.7*  NEUTROABS 2.1  --  2.7  --   --   --   --  1.9  --   HGB 9.3*   < > 9.5*   < > 8.8* 8.2* 9.5* 7.7* 8.2*  HCT 30.8*   < > 31.4*   < > 28.7* 28.1* 32.0* 25.4* 27.5*  MCV 101.0*  --  100.0  --  99.3 99.3 99.1 98.4 98.2  PLT 58*  --  77*  --  53* 54* 62* 63* 61*   < > = values in this interval not displayed.   Blood Culture    Component Value Date/Time   SDES URINE, CLEAN CATCH 09-Sep-202019 1040   SPECREQUEST NONE 09-Sep-202019 1040   CULT  09-Sep-202019 1040    NO GROWTH Performed at [facility], [place] [address] [place], [place] [address]    REPTSTATUS 10/14/2018 FINAL 09-Sep-202019 1040    Cardiac Enzymes: Recent Labs  Lab 10/14/18 1320 10/14/18 1507 10/14/18 1808  TROPONINI 0.78* 0.77* 0.67*   CBG: No results for input(s): GLUCAP in the last 168 hours. Iron Studies:  Recent Labs    10/15/18 0535  IRON 32  TIBC 141*  FERRITIN 3,923*   Lab Results  Component Value Date   INR 1.11 09/03/2018   INR 1.05 07/20/2016   INR 1.09 05/20/2014   Medications:  . aspirin EC  325 mg Oral Daily  . Chlorhexidine Gluconate Cloth  6 each Topical Q0600  . darbepoetin (ARANESP) injection - DIALYSIS  150 mcg Intravenous Q Wed-HD  . doxercalciferol  5 mcg Intravenous Q M,W,F-HD  . feeding supplement (PRO-STAT SUGAR FREE 64)  30 mL Oral BID  . multivitamin with minerals  1 tablet Oral Daily  . sevelamer carbonate  1,600 mg Oral TID WC

## 2018-10-18 DIAGNOSIS — I5032 Chronic diastolic (congestive) heart failure: Secondary | ICD-10-CM | POA: Diagnosis not present

## 2018-10-18 DIAGNOSIS — D638 Anemia in other chronic diseases classified elsewhere: Secondary | ICD-10-CM | POA: Diagnosis not present

## 2018-10-18 DIAGNOSIS — N184 Chronic kidney disease, stage 4 (severe): Secondary | ICD-10-CM | POA: Diagnosis not present

## 2018-10-18 DIAGNOSIS — I259 Chronic ischemic heart disease, unspecified: Secondary | ICD-10-CM | POA: Diagnosis not present

## 2018-10-18 LAB — TYPE AND SCREEN
ABO/RH(D): B POS
Antibody Screen: NEGATIVE
Unit division: 0
Unit division: 0

## 2018-10-18 LAB — BPAM RBC
Blood Product Expiration Date: 201912302359
Blood Product Expiration Date: 201912302359
Unit Type and Rh: 7300
Unit Type and Rh: 7300

## 2018-10-18 MED ORDER — ADULT MULTIVITAMIN W/MINERALS CH: 1.0000 | ORAL_TABLET | Freq: Every day | ORAL | 0 refills | Status: DC

## 2018-10-18 MED ORDER — ASPIRIN 81 MG PO CHEW: 81.0000 mg | CHEWABLE_TABLET | Freq: Every day | ORAL | 0 refills | Status: DC

## 2018-10-18 MED ORDER — PRO-STAT SUGAR FREE PO LIQD: 30.0000 mL | Freq: Two times a day (BID) | ORAL | 0 refills | Status: DC

## 2018-10-18 MED ORDER — SEVELAMER CARBONATE 800 MG PO TABS: 1600.0000 mg | ORAL_TABLET | Freq: Three times a day (TID) | ORAL | 0 refills | Status: DC

## 2018-10-18 NOTE — Discharge Summary (Signed)
Discharge Summary  [name] [name] RFX:[id number] DOB: [birth year]  PCP: Ann Held, DO  Admit date: 06-22-202019 Discharge date: 10/18/2018  Time spent: 35 minutes  Recommendations for Outpatient Follow-up:  1. Follow-up with your PCP 2. Follow-up with nephrology 3. Take your medications as prescribed 4. Keep your hemodialysis appointments  Discharge Diagnoses:  Active Hospital Problems   Diagnosis Date Noted  . Chest pain 05/17/2014  . Anemia, chronic disease 03/01/2018  . Respiratory failure with hypoxia ([place]) 08/18/2017  . Volume overload 08/18/2017  . Chronic diastolic heart failure ([place]) 11/25/2016  . ESRD (end stage renal disease) on dialysis ([place]) 06/25/2014  . Malignant hypertension 05/17/2014  . Elevated troponin 05/17/2014  . Chronic kidney disease, stage IV (severe) ([place]) 12/05/2013  . GERD 06/17/2009  . Unspecified glaucoma 05/20/2009  . Hyperlipidemia LDL goal <70 09/21/2007  . Essential hypertension 09/13/2007  . Type II diabetes mellitus with nephropathy [facility])     Resolved Hospital Problems  No resolved problems to display.    Discharge Condition: Stable  Diet recommendation: Resume previous diet renal dialysis diet.  Vitals:   10/18/18 0600 10/18/18 0848  BP: (!) 135/51 114/64  Pulse: 75 79  Resp: 16 16  Temp: 98.6 F (37 C) 98.4 F (36.9 C)  SpO2: 100% 100%    History of present illness:  77 year old female witha hx ofdiabetesmellitus,ESRDon hemodialysis(Monday, Wednesday and Friday), noncompliance with hemodialysis,GERD,glaucoma,hepatitis B,anddepressionwhowas admitted with uremic syndromeafter shemissed 2 sessions of hemodialysis prior to presentation.Patient has been seen and dialyzed already yesterday.  10/17/2018: Persistent generalized weakness.  PT assessed and recommended SNF. HD planned today.  10/18/2018: Patient seen and examined with her husband at bedside.  She declines SNF.  States that her husband  will take care of her.  No new complaints.  No acute events overnight.  On the day of discharge, the patient was hemodynamically stable.  She will need to follow-up with her PCP, nephrology and keep her hemodialysis appointments as scheduled.   Hospital Course:  Principal Problem:   Chest pain Active Problems:   Type II diabetes mellitus with nephropathy (HCC)   Hyperlipidemia LDL goal <70   Unspecified glaucoma   Essential hypertension   GERD   Chronic kidney disease, stage IV (severe) (HCC)   Malignant hypertension   Elevated troponin   ESRD (end stage renal disease) on dialysis (HCC)   Chronic diastolic heart failure (HCC)   Respiratory failure with hypoxia (HCC)   Volume overload   Anemia, chronic disease  Chest pain rule out ACS Suspect demand ischemia Troponin peaked at 0.78 and trended down Chest pain has resolved  Volume overload/pulmonary edema secondary to noncompliance with dialysis Continue hemodialysis as scheduled Hemodialysis 10/17/2018  Hypotension Maintain map greater than 65 Continue to hold amlodipine  Anemia of chronic disease secondary to end-stage renal disease Baseline hemoglobin 9.5 Hemoglobin 8.7 today No sign of overt bleeding Nephrology following  Chronic pancytopenia Appears stable WBC 2.9 and platelet 80 Continue to monitor  Ambulatory dysfunction/physical debility PT recommend SNF Patient declines SNF Fall precautions   Code Status:Full   Consultants:  Nephrology  Procedures:  None   Antimicrobials:  None    Discharge Exam: BP 114/64 (BP Location: Right Arm)   Pulse 79   Temp 98.4 F (36.9 C) (Oral)   Resp 16   Ht 5' 5.5" (1.664 m)   Wt 51.8 kg   SpO2 100%   BMI 18.71 kg/m  . General: 77 y.o. year-old female well developed well nourished in  no acute distress.  Alert and oriented x3. . Cardiovascular: Regular rate and rhythm with no rubs or gallops.  No thyromegaly or JVD noted.    Marland Kitchen Respiratory: Clear to auscultation with no wheezes or rales. Good inspiratory effort. . Abdomen: Soft nontender nondistended with normal bowel sounds x4 quadrants. . Musculoskeletal: No lower extremity edema. 2/4 pulses in all 4 extremities. . Skin: No ulcerative lesions noted or rashes, . Psychiatry: Mood is appropriate for condition and setting  Discharge Instructions You were cared for by a hospitalist during your hospital stay. If you have any questions about your discharge medications or the care you received while you were in the hospital after you are discharged, you can call the unit and asked to speak with the hospitalist on call if the hospitalist that took care of you is not available. Once you are discharged, your primary care physician will handle any further medical issues. Please note that NO REFILLS for any discharge medications will be authorized once you are discharged, as it is imperative that you return to your primary care physician (or establish a relationship with a primary care physician if you do not have one) for your aftercare needs so that they can reassess your need for medications and monitor your lab values.   Allergies as of 10/18/2018      Reactions   Hydralazine Itching   Robaxin [methocarbamol] Other (See Comments)   Dizziness       Medication List    STOP taking these medications   amLODipine 10 MG tablet Commonly known as:  NORVASC   carvedilol 25 MG tablet Commonly known as:  COREG   cephALEXin 250 MG capsule Commonly known as:  KEFLEX   doxycycline 100 MG capsule Commonly known as:  VIBRAMYCIN     TAKE these medications   aspirin 81 MG chewable tablet Commonly known as:  ASPIRIN CHILDRENS Chew 1 tablet (81 mg total) by mouth daily.   DIALYVITE/ZINC Tabs Take 1 tablet by mouth daily.   feeding supplement (PRO-STAT SUGAR FREE 64) Liqd Take 30 mLs by mouth 2 (two) times daily.   multivitamin with minerals Tabs tablet Take 1 tablet  by mouth daily. Start taking on:  October 19, 2018   sevelamer carbonate 800 MG tablet Commonly known as:  RENVELA Take 2 tablets (1,600 mg total) by mouth 3 (three) times daily with meals.      Allergies  Allergen Reactions  . Hydralazine Itching  . Robaxin [Methocarbamol] Other (See Comments)    Dizziness    Follow-up Information    [facility] Follow up.   Specialty:  [place] Why:  Home Health Physical Therapy, and RN-will call to arrange initial appointment Contact information: [address] [place] [address] [phone]        Carollee Herter, Alferd Apa, DO. Call in 1 day(s).   Specialty:  Family Medicine Why:  Please call for a post hospital follow-up appointment. Contact information: [address] [address] [phone]        Josue Hector, MD .   Specialty:  Cardiology Contact information: [phone] [address]. [address] [place] [address] [phone]            The results of significant diagnostics from this hospitalization (including imaging, microbiology, ancillary and laboratory) are listed below for reference.    Significant Diagnostic Studies: Dg Chest 2 View  Result Date: 10/14/2018 CLINICAL DATA:  Pulmonary edema, follow-up EXAM: CHEST -  2 VIEW COMPARISON:  20-Oct-202019 FINDINGS: Enlargement of cardiac silhouette. Atherosclerotic calcification aorta. Mild scattered pulmonary infiltrates consistent with improving pulmonary edema. Tiny bibasilar effusions. No pneumothorax. Diffuse osseous demineralization. Vascular stent identified LEFT upper arm. IMPRESSION: Improved pulmonary edema. Enlargement of cardiac silhouette. Tiny bibasilar effusions. Electronically Signed   By: Lavonia Dana M.D.   On: 10/14/2018 17:34   Dg Chest 2 View  Result Date: 10/12/2018 CLINICAL DATA:  Productive cough.  Chest pain. EXAM: CHEST - 2 VIEW COMPARISON:  CT 09/19/2018. FINDINGS: Mediastinum appears  normal. Persistent fullness right hilum. Cardiomegaly with mild bilateral interstitial prominence and small right pleural effusion. Findings suggest CHF. Pneumonitis can not be excluded. No pneumothorax. Degenerative change thoracic spine. IMPRESSION: Cardiomegaly with mild bilateral interstitial prominence and small right pleural effusion. Findings suggest CHF. Pneumonitis could also present in this fashion. Electronically Signed   By: Marcello Moores  Register   On: 10/12/2018 13:35   Dg Chest 2 View  Result Date: 09/19/2018 CLINICAL DATA:  Chest pain, near syncope, fatigue, dialysis patient EXAM: CHEST - 2 VIEW COMPARISON:  Chest x-ray of 03/30/2017 FINDINGS: No active infiltrate or effusion is seen. However there is nodular prominence of the right hilum which also appears somewhat prominent on the lateral view. This could represent hilar mass or adenopathy and CT of the chest with IV contrast media would be helpful if possible in this renal failure patient. The heart is mildly enlarged and stable. No bony abnormality is seen. IMPRESSION: 1. Nodular prominence of the right hilum. Cannot exclude mass or adenopathy. Recommend CT of the chest with IV contrast media if possible in this dialysis patient. 2. Stable cardiomegaly. Electronically Signed   By: Ivar Drape M.D.   On: 09/19/2018 13:32   Ct Head Wo Contrast  Result Date: 20-Oct-202019 CLINICAL DATA:  Generalized weakness, altered level of consciousness. EXAM: CT HEAD WITHOUT CONTRAST TECHNIQUE: Contiguous axial images were obtained from the base of the skull through the vertex without intravenous contrast. COMPARISON:  CT scan of July 20, 2016. FINDINGS: Brain: Mild diffuse cortical atrophy is noted. Mild chronic ischemic white matter disease is noted. No mass effect or midline shift is noted. Ventricular size is within normal limits. There is no evidence of mass lesion, hemorrhage or acute infarction. Vascular: No hyperdense vessel or unexpected  calcification. Skull: Normal. Negative for fracture or focal lesion. Sinuses/Orbits: No acute finding. Other: None. IMPRESSION: Mild diffuse cortical atrophy. Mild chronic ischemic white matter disease. No acute intracranial abnormality seen. Electronically Signed   By: Marijo Conception, M.D.   On: 20-Oct-202019 11:38   Ct Chest Wo Contrast  Result Date: 09/19/2018 CLINICAL DATA:  Prominent right hilum on chest radiograph. Fatigue. Malaise. End-stage renal disease on hemodialysis. EXAM: CT CHEST WITHOUT CONTRAST TECHNIQUE: Multidetector CT imaging of the chest was performed following the standard protocol without IV contrast. COMPARISON:  Chest radiograph from earlier today. FINDINGS: Cardiovascular: Mild cardiomegaly. No significant pericardial effusion/thickening. Left main and 3 vessel coronary atherosclerosis. Atherosclerotic nonaneurysmal thoracic aorta. Dense calcification of the proximal right brachiocephalic artery, probably representing chronic occlusion. Normal caliber pulmonary arteries. Mediastinum/Nodes: No discrete thyroid nodules. Unremarkable esophagus. No pathologically enlarged axillary, mediastinal or hilar lymph nodes, noting limited sensitivity for the detection of hilar adenopathy on this noncontrast study. Lungs/Pleura: No pneumothorax. No pleural effusion. No central airway stenoses. No acute consolidative airspace disease, lung masses or significant pulmonary nodules. Nonspecific diffuse bronchial wall thickening. Bandlike subpleural consolidation at the medial right lung base with associated mild volume loss and  distortion is stable since 11/07/2006 CT abdomen study, most compatible with postinfectious/postinflammatory scarring. Mild patchy subpleural reticulation and ground-glass attenuation at both lung bases without significant traction bronchiectasis, architectural distortion or frank honeycombing. These findings are only minimally increased since 2008 CT abdomen study at the lung  bases. Upper abdomen: Cholecystectomy. Liver surface appears diffusely irregular, compatible with cirrhosis. Trace perihepatic ascites. Musculoskeletal: No aggressive appearing focal osseous lesions. Moderate thoracic spondylosis. IMPRESSION: 1. No pathologically enlarged thoracic nodes. Specifically, no convincing hilar adenopathy on this scan limited by non-contrast technique. 2. No acute pulmonary disease. 3. Mild patchy subpleural reticulation and ground-glass attenuation at the lung bases, only mildly increased since 2008 CT abdomen study. Findings probably reflect nonspecific postinfectious/postinflammatory scarring. The possibility of an indolent interstitial lung disease such as nonspecific interstitial pneumonia (NSIP) is not excluded. Usual interstitial pneumonia (UIP) is unlikely given minimal change over many years. A follow-up high-resolution chest CT study could be obtained in 12 months to assess ongoing temporal pattern stability if there is clinical concern for interstitial lung disease. 4. Hepatic cirrhosis.  Trace perihepatic ascites. 5. Mild cardiomegaly. Left main and 3 vessel coronary atherosclerosis. 6. 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 Aortic Atherosclerosis (ICD10-I70.0). Electronically Signed   By: Ilona Sorrel M.D.   On: 09/19/2018 15:31   Dg Chest Port 1 View  Result Date: 05-15-2018 CLINICAL DATA:  77 year old female with a 1 week history of generalized weakness. Patient has not dialyzed since Monday 12 9. EXAM: PORTABLE CHEST 1 VIEW COMPARISON:  Prior chest x-ray 10/12/2018 FINDINGS: Stable cardiomegaly. Atherosclerotic calcifications again noted in the transverse aorta. Increasing pulmonary vascular congestion with developing patchy bilateral perihilar interstitial airspace opacities. A vascular stent overlies the soft tissues of the left upper arm. No acute osseous abnormality. IMPRESSION: 1. Increased pulmonary vascular congestion with  progressive bilateral perihilar interstitial and airspace opacities. Findings most likely represent volume overload in the setting of missed dialysis. 2. Stable cardiomegaly. 3.  Aortic Atherosclerosis (ICD10-170.0) Electronically Signed   By: Jacqulynn Cadet M.D.   On: 05-15-2018 12:03   Ct Renal Stone Study  Result Date: 09/28/2018 CLINICAL DATA:  Right flank pain.  Chronic renal failure. EXAM: CT ABDOMEN AND PELVIS WITHOUT CONTRAST TECHNIQUE: Multidetector CT imaging of the abdomen and pelvis was performed following the standard protocol without IV contrast. COMPARISON:  CT scan dated 11/07/2006 FINDINGS: Lower chest: No acute abnormalities. Chronic atelectasis and slight bronchiectasis at the right lung base medially. Aortic atherosclerosis. Small chronic hiatal hernia. Hepatobiliary: There is a nodular liver contour suggestive of cirrhosis. No focal up attic lesions. Cholecystectomy. No dilated bile ducts. Pancreas: Focal small calcification in the head of the pancreas. The pancreas is otherwise normal. Spleen: Normal in size without focal abnormality. Adrenals/Urinary Tract: Normal adrenal glands. Bilateral renal atrophy with parenchymal and vascular calcifications. No hydronephrosis. The bladder is empty. Stomach/Bowel: Multiple diverticula in the sigmoid portion of the colon. Small hiatal hernia. The bowel is otherwise normal including the terminal ileum and appendix Vascular/Lymphatic: Aortic atherosclerosis. No enlarged abdominal or pelvic lymph nodes. Reproductive: Uterus and bilateral adnexa are unremarkable. Other: No abdominal wall hernia or abnormality. No abdominopelvic ascites. Musculoskeletal: No acute abnormality. Multilevel degenerative facet arthritis in the lumbar spine. Moderate spinal stenosis at L4-5. IMPRESSION: 1. No acute abnormality of the abdomen or pelvis. 2. Sigmoid diverticulosis. 3. Cirrhosis. 4. Small chronic hiatal hernia. Electronically Signed   By: Lorriane Shire M.D.    On: 09/28/2018 13:27   Dg Hip Unilat W Or Wo  Pelvis 2-3 Views Right  Result Date: 09/28/2018 CLINICAL DATA:  Right hip pain. EXAM: DG HIP (WITH OR WITHOUT PELVIS) 2-3V RIGHT COMPARISON:  None. FINDINGS: No acute fracture or dislocation identified. Bones are osteopenic. No bony lesions or destruction. Mild osteoarthritis of the hip joints bilaterally. No evidence of pelvic fracture or diastasis. Extensive vascular calcifications present in a pattern consistent with underlying diabetes. IMPRESSION: No acute fracture or dislocation. Mild osteoarthritis of both hip joints. Electronically Signed   By: Aletta Edouard M.D.   On: 09/28/2018 13:33    Microbiology: Recent Results (from the past 240 hour(s))  Urine culture     Status: None   Collection Time: 10/13/18 10:40 AM  Result Value Ref Range Status   Specimen Description URINE, CLEAN CATCH  Final   Special Requests NONE  Final   Culture   Final    NO GROWTH Performed at [facility], [address] [place], [place] [address]    Report Status 10/14/2018 FINAL  Final     Labs: Basic Metabolic Panel: Recent Labs  Lab 10/14/18 0132 10/14/18 1507 10/15/18 0244 10/16/18 0331 10/17/18 1313  NA 141 136 138 138 137  K 4.6 3.9 3.7 4.1 4.3  CL 102 98 99 99 99  CO2 18* 24 25 28 25   GLUCOSE 102* 80 94 84 84  BUN 102* 48* 59* 26* 45*  CREATININE 9.56* 5.68* 6.70* 4.36* 6.07*  CALCIUM 8.9 8.3* 8.5* 8.9 10.0  MG  --   --  1.9  --   --   PHOS 10.2* 6.7* 6.5* 4.0 4.7*   Liver Function Tests: Recent Labs  Lab 10/14/18 0132 10/14/18 1507 10/15/18 0244 10/16/18 0331 10/17/18 1313  ALBUMIN 2.6* 2.4* 2.3* 2.4* 2.6*   No results for input(s): LIPASE, AMYLASE in the last 168 hours. No results for input(s): AMMONIA in the last 168 hours. CBC: Recent Labs  Lab 10/12/18 1345  10/13/18 1037  10/14/18 0030 10/14/18 0551 10/15/18 0244 10/16/18 0331 10/17/18 1313  WBC 2.7*  --  3.2*   < > 2.5* 3.5* 2.8* 2.7* 2.9*  NEUTROABS 2.1   --  2.7  --   --   --  1.9  --   --   HGB 9.3*   < > 9.5*   < > 8.2* 9.5* 7.7* 8.2* 8.7*  HCT 30.8*   < > 31.4*   < > 28.1* 32.0* 25.4* 27.5* 28.5*  MCV 101.0*  --  100.0   < > 99.3 99.1 98.4 98.2 97.9  PLT 58*  --  77*   < > 54* 62* 63* 61* 80*   < > = values in this interval not displayed.   Cardiac Enzymes: Recent Labs  Lab 10/14/18 1320 10/14/18 1507 10/14/18 1808  TROPONINI 0.78* 0.77* 0.67*   BNP: BNP (last 3 results) Recent Labs    11/07/17 0015  BNP 1,397.8*    ProBNP (last 3 results) No results for input(s): PROBNP in the last 8760 hours.  CBG: No results for input(s): GLUCAP in the last 168 hours.     Signed:  Kayleen Memos, MD [facility] 10/18/2018, 11:32 AM

## 2018-10-18 NOTE — Progress Notes (Signed)
[facility] Progress Note   Subjective:  Seen in room. No c/os. HD yesterday without issues. Denies CP/SOB.  Still working on dispo planning. Home vs. SNF   Objective Vitals:   10/17/18 1716 10/17/18 2100 10/18/18 0600 10/18/18 0848  BP: 104/74 (!) 120/57 (!) 135/51 114/64  Pulse: 72 76 75 79  Resp: 16 16 16 16   Temp: 97.8 F (36.6 C) 98.5 F (36.9 C) 98.6 F (37 C) 98.4 F (36.9 C)  TempSrc: Oral Oral Oral Oral  SpO2: 98% 100% 100% 100%  Weight: 51.8 kg     Height:       Physical Exam General: Frail appearing elderly female NAD Heart: RRR 3/6 SEM  Lungs: CTAB Abdomen: +bs soft NT/ND Extremities: No LE edema  Dialysis Access: LUE AVG +bruit   Dialysis Orders:  [facility] MWF  3.5h 400/800 56.5kg 3K/2.25 bath Hep none LUE AVG  - Mircera 150 last 12/4 - hect 5 ug tiw   Assessment/Plan: 1. Gen'd weakness - uremia +vol overload from missed HD. Better with HD. PT recommending SNF placement.  2. Vol overload/ pulm edema - improved on exam and on repeat CXR. Now under dry weight. Will lower at discharge. Post HD wt on 12/18 51.8kg 3. ESRD - MWF. Continue on schedule. 4. Anemia - Hg 8.7 .Tsat 23% Ferritin 3923. High Ferritin prohibits IV Fe. Aranesp 150 dosed 12/18.  5. HTN - BP stable. Amlodipine on med list. Monitor  6. MBD -  Ca 8.9>10. Monitor. Phos improving. On Auryxia as outpatient. Will hold d/t ^^^ferritin. Will start Renvela 1600 tid qac.  7. Nutrition - Renal diet/vitamins. Add prot supp for low albumin  8. Dispo - stable for d/c from renal standpoint   Lynnda Child PA-C [facility] Pager [phone] 10/18/2018,9:22 AM  LOS: 0 days    Additional Objective Labs: Basic Metabolic Panel: Recent Labs  Lab 10/15/18 0244 10/16/18 0331 10/17/18 1313  NA 138 138 137  K 3.7 4.1 4.3  CL 99 99 99  CO2 25 28 25   GLUCOSE 94 84 84  BUN 59* 26* 45*  CREATININE 6.70* 4.36* 6.07*  CALCIUM 8.5* 8.9 10.0  PHOS 6.5* 4.0  4.7*   CBC: Recent Labs  Lab 10/12/18 1345  10/13/18 1037  10/14/18 0030 10/14/18 0551 10/15/18 0244 10/16/18 0331 10/17/18 1313  WBC 2.7*  --  3.2*   < > 2.5* 3.5* 2.8* 2.7* 2.9*  NEUTROABS 2.1  --  2.7  --   --   --  1.9  --   --   HGB 9.3*   < > 9.5*   < > 8.2* 9.5* 7.7* 8.2* 8.7*  HCT 30.8*   < > 31.4*   < > 28.1* 32.0* 25.4* 27.5* 28.5*  MCV 101.0*  --  100.0   < > 99.3 99.1 98.4 98.2 97.9  PLT 58*  --  77*   < > 54* 62* 63* 61* 80*   < > = values in this interval not displayed.   Blood Culture    Component Value Date/Time   SDES URINE, CLEAN CATCH 08/15/202019 1040   SPECREQUEST NONE 08/15/202019 1040   CULT  08/15/202019 1040    NO GROWTH Performed at [facility], [place] [address], [place], [place] [address]    REPTSTATUS 10/14/2018 FINAL 08/15/202019 1040    Cardiac Enzymes: Recent Labs  Lab 10/14/18 1320 10/14/18 1507 10/14/18 1808  TROPONINI 0.78* 0.77* 0.67*   CBG: No results for input(s): GLUCAP  in the last 168 hours. Iron Studies:  No results for input(s): IRON, TIBC, TRANSFERRIN, FERRITIN in the last 72 hours. Lab Results  Component Value Date   INR 1.11 09/03/2018   INR 1.05 07/20/2016   INR 1.09 05/20/2014   Medications: . sodium chloride    . sodium chloride     . aspirin EC  325 mg Oral Daily  . Chlorhexidine Gluconate Cloth  6 each Topical Q0600  . darbepoetin (ARANESP) injection - DIALYSIS  150 mcg Intravenous Q Wed-HD  . doxercalciferol  5 mcg Intravenous Q M,W,F-HD  . feeding supplement (PRO-STAT SUGAR FREE 64)  30 mL Oral BID  . multivitamin with minerals  1 tablet Oral Daily  . sevelamer carbonate  1,600 mg Oral TID WC

## 2018-10-18 NOTE — Progress Notes (Signed)
Physical Therapy Treatment Patient Details Name: [name] [name] MRN: [id number] DOB: [birth year] Today's Date: 10/18/2018    History of Present Illness Pt is a 77 y.o. female admitted for chest and abdominal pain secondary to ESRD and potential uremic syndrome. CXR: pulmonary edema, cardiomegaly. PMH: HTN, HLD, T1DM, Glaucoma, Hep B carrier, ESRD - two missed HD prior to admission.     PT Comments    Pt admitted with above diagnosis. Pt currently with functional limitations due to balance and endurance deficits. Pt was able to ambulate with RW with min assist and mod cues for safety with husband present and demonstrates that he can assist and cue pt appropriately.  Pt and husband state they can manage at home and have children to help.  Issued gait belt and husband demonstrates appropriate use of gait belt.  They decline HHPT although this PT recommends it.   Pt will benefit from skilled PT to increase their independence and safety with mobility to allow discharge to the venue listed below.     Follow Up Recommendations  Home health PT;Supervision/Assistance - 24 hour(recommend HHPT but pt refuses)     Equipment Recommendations  None recommended by PT    Recommendations for Other Services       Precautions / Restrictions Precautions Precautions: Fall Restrictions Weight Bearing Restrictions: No    Mobility  Bed Mobility Overal bed mobility: Needs Assistance Bed Mobility: Sit to Supine;Supine to Sit     Supine to sit: HOB elevated;Min assist Sit to supine: Min assist   General bed mobility comments: min assist to elevate trunk.  moderate verbal cues to attend to task to scoot to edge of bed  Transfers Overall transfer level: Needs assistance Equipment used: Rolling walker (2 wheeled) Transfers: Sit to/from Stand Sit to Stand: Min assist;From elevated surface         General transfer comment: Assist to power up.  Assist to decrease posterior lean on bed to upright  standing   Ambulation/Gait Ambulation/Gait assistance: Min assist;Mod assist Gait Distance (Feet): 100 Feet Assistive device: Rolling walker (2 wheeled) Gait Pattern/deviations: Step-through pattern;Decreased stride length;Drifts right/left;Trunk flexed;Wide base of support;Leaning posteriorly   Gait velocity interpretation: <1.31 ft/sec, indicative of household ambulator General Gait Details: Pt was able to ambulate with RW but needed mod cues for proximity to RW and needing min assist to steady as she has posterior lean.  Husband present and had husband assist pt and cue pt to show PT he could do so.  Issued them a gait belt as well.  They feel they can manage at home with the children's help.    Stairs             Wheelchair Mobility    Modified Rankin (Stroke Patients Only)       Balance Overall balance assessment: Needs assistance Sitting-balance support: No upper extremity supported;Feet supported Sitting balance-Leahy Scale: Fair Sitting balance - Comments: Posterior lean, VCs could correct and sustain for 2-3 min.  Postural control: Posterior lean Standing balance support: Bilateral upper extremity supported;During functional activity Standing balance-Leahy Scale: Poor Standing balance comment: Min assist for static stand due to posterior lean                            Cognition Arousal/Alertness: Awake/alert Behavior During Therapy: WFL for tasks assessed/performed Overall Cognitive Status: History of cognitive impairments - at baseline Area of Impairment: Orientation;Awareness;Problem solving;Following commands;Safety/judgement  Orientation Level: Disoriented to;Time;Situation;Place Current Attention Level: Focused   Following Commands: Follows one step commands consistently;Follows one step commands with increased time Safety/Judgement: Decreased awareness of safety;Decreased awareness of deficits   Problem Solving: Slow  processing;Decreased initiation;Difficulty sequencing;Requires verbal cues;Requires tactile cues General Comments: Pt requiring cues to attend to task      Exercises      General Comments        Pertinent Vitals/Pain Pain Assessment: No/denies pain    Home Living                      Prior Function            PT Goals (current goals can now be found in the care plan section) Acute Rehab PT Goals Patient Stated Goal: Wants to return home Progress towards PT goals: Progressing toward goals    Frequency    Min 3X/week      PT Plan Discharge plan needs to be updated    Co-evaluation              AM-PAC PT "6 Clicks" Mobility   Outcome Measure  Help needed turning from your back to your side while in a flat bed without using bedrails?: None Help needed moving from lying on your back to sitting on the side of a flat bed without using bedrails?: A Little Help needed moving to and from a bed to a chair (including a wheelchair)?: A Little Help needed standing up from a chair using your arms (e.g., wheelchair or bedside chair)?: A Little Help needed to walk in hospital room?: A Lot Help needed climbing 3-5 steps with a railing? : A Lot 6 Click Score: 17    End of Session Equipment Utilized During Treatment: Gait belt Activity Tolerance: Patient tolerated treatment well Patient left: in bed;with call bell/phone within reach;with bed alarm set;with family/visitor present Nurse Communication: Mobility status PT Visit Diagnosis: Other abnormalities of gait and mobility (R26.89);Muscle weakness (generalized) (M62.81);Unsteadiness on feet (R26.81)     Time: 1033-1100 PT Time Calculation (min) (ACUTE ONLY): 27 min  Charges:  $Gait Training: 23-37 mins                     Cecilia Pager:  [phone]  Office:  [place] 10/18/2018, 1:24 PM

## 2018-10-19 ENCOUNTER — Telehealth: Payer: Self-pay

## 2018-10-19 DIAGNOSIS — N186 End stage renal disease: Secondary | ICD-10-CM | POA: Diagnosis not present

## 2018-10-19 DIAGNOSIS — N2581 Secondary hyperparathyroidism of renal origin: Secondary | ICD-10-CM | POA: Diagnosis not present

## 2018-10-19 NOTE — Telephone Encounter (Signed)
TCM call made to patient. Un able to leave message for return call.

## 2018-10-21 DIAGNOSIS — N2581 Secondary hyperparathyroidism of renal origin: Secondary | ICD-10-CM | POA: Diagnosis not present

## 2018-10-21 DIAGNOSIS — N186 End stage renal disease: Secondary | ICD-10-CM | POA: Diagnosis not present

## 2018-10-23 ENCOUNTER — Telehealth: Payer: Self-pay

## 2018-10-23 DIAGNOSIS — N2581 Secondary hyperparathyroidism of renal origin: Secondary | ICD-10-CM | POA: Diagnosis not present

## 2018-10-23 DIAGNOSIS — N186 End stage renal disease: Secondary | ICD-10-CM | POA: Diagnosis not present

## 2018-10-23 NOTE — Telephone Encounter (Signed)
[facility] follow up call made to patient. No answer,no answering machine.

## 2018-10-25 ENCOUNTER — Telehealth: Payer: Self-pay | Admitting: *Deleted

## 2018-10-25 NOTE — Telephone Encounter (Signed)
Received Physician Orders from [facility]; forwarded to provider/SLS 12/26  

## 2018-10-26 DIAGNOSIS — N186 End stage renal disease: Secondary | ICD-10-CM | POA: Diagnosis not present

## 2018-10-26 DIAGNOSIS — N2581 Secondary hyperparathyroidism of renal origin: Secondary | ICD-10-CM | POA: Diagnosis not present

## 2018-10-30 DIAGNOSIS — N186 End stage renal disease: Secondary | ICD-10-CM | POA: Diagnosis not present

## 2018-10-30 DIAGNOSIS — N2581 Secondary hyperparathyroidism of renal origin: Secondary | ICD-10-CM | POA: Diagnosis not present

## 2018-10-30 DIAGNOSIS — Z992 Dependence on renal dialysis: Secondary | ICD-10-CM | POA: Diagnosis not present

## 2018-10-30 DIAGNOSIS — E1122 Type 2 diabetes mellitus with diabetic chronic kidney disease: Secondary | ICD-10-CM | POA: Diagnosis not present

## 2018-11-01 ENCOUNTER — Telehealth: Payer: Self-pay | Admitting: Family Medicine

## 2018-11-01 ENCOUNTER — Telehealth: Payer: Self-pay | Admitting: *Deleted

## 2018-11-01 NOTE — Telephone Encounter (Signed)
Noted-- pt refused recommended snf referral

## 2018-11-01 NOTE — Telephone Encounter (Signed)
Copied from [place] [id number]. Topic: Quick Communication - Home Health Verbal Orders >> Nov 01, 2018 11:25 AM Conception Chancy, NT wrote: Caller/Agency: [facility] Callback Number: [phone] Requesting OT/PT/Skilled Nursing/Social Work: Nursing  Frequency:   Tommi Rumps states that they received a referral from the hospital to see the patient but she reports that she is not homebound. Therefore they are unable to see patient. Please advise.

## 2018-11-01 NOTE — Telephone Encounter (Signed)
Received Physician Orders from [facility]; forwarded to provider/SLS 01/02

## 2018-11-02 DIAGNOSIS — N186 End stage renal disease: Secondary | ICD-10-CM | POA: Diagnosis not present

## 2018-11-02 DIAGNOSIS — N2581 Secondary hyperparathyroidism of renal origin: Secondary | ICD-10-CM | POA: Diagnosis not present

## 2018-11-03 ENCOUNTER — Emergency Department (HOSPITAL_COMMUNITY): Payer: Medicare HMO

## 2018-11-03 ENCOUNTER — Encounter (HOSPITAL_COMMUNITY): Payer: Self-pay | Admitting: Emergency Medicine

## 2018-11-03 ENCOUNTER — Emergency Department (HOSPITAL_COMMUNITY)
Admission: EM | Admit: 2018-11-03 | Discharge: 2018-11-03 | Disposition: A | Discharge status: Home / Self Care | Payer: Medicare HMO | Attending: Emergency Medicine | Admitting: Emergency Medicine

## 2018-11-03 DIAGNOSIS — I5032 Chronic diastolic (congestive) heart failure: Secondary | ICD-10-CM | POA: Insufficient documentation

## 2018-11-03 DIAGNOSIS — R079 Chest pain, unspecified: Secondary | ICD-10-CM | POA: Insufficient documentation

## 2018-11-03 DIAGNOSIS — I4891 Unspecified atrial fibrillation: Secondary | ICD-10-CM | POA: Diagnosis not present

## 2018-11-03 DIAGNOSIS — D631 Anemia in chronic kidney disease: Secondary | ICD-10-CM | POA: Diagnosis not present

## 2018-11-03 DIAGNOSIS — I132 Hypertensive heart and chronic kidney disease with heart failure and with stage 5 chronic kidney disease, or end stage renal disease: Secondary | ICD-10-CM | POA: Insufficient documentation

## 2018-11-03 DIAGNOSIS — Z7982 Long term (current) use of aspirin: Secondary | ICD-10-CM | POA: Diagnosis not present

## 2018-11-03 DIAGNOSIS — I471 Supraventricular tachycardia: Secondary | ICD-10-CM | POA: Insufficient documentation

## 2018-11-03 DIAGNOSIS — N189 Chronic kidney disease, unspecified: Secondary | ICD-10-CM

## 2018-11-03 DIAGNOSIS — I499 Cardiac arrhythmia, unspecified: Secondary | ICD-10-CM | POA: Diagnosis not present

## 2018-11-03 DIAGNOSIS — Z79899 Other long term (current) drug therapy: Secondary | ICD-10-CM | POA: Insufficient documentation

## 2018-11-03 DIAGNOSIS — Z992 Dependence on renal dialysis: Secondary | ICD-10-CM | POA: Insufficient documentation

## 2018-11-03 DIAGNOSIS — N186 End stage renal disease: Secondary | ICD-10-CM | POA: Diagnosis not present

## 2018-11-03 DIAGNOSIS — R0789 Other chest pain: Secondary | ICD-10-CM | POA: Diagnosis not present

## 2018-11-03 DIAGNOSIS — E785 Hyperlipidemia, unspecified: Secondary | ICD-10-CM | POA: Diagnosis not present

## 2018-11-03 DIAGNOSIS — I252 Old myocardial infarction: Secondary | ICD-10-CM | POA: Diagnosis not present

## 2018-11-03 DIAGNOSIS — I120 Hypertensive chronic kidney disease with stage 5 chronic kidney disease or end stage renal disease: Secondary | ICD-10-CM | POA: Diagnosis not present

## 2018-11-03 DIAGNOSIS — E1022 Type 1 diabetes mellitus with diabetic chronic kidney disease: Secondary | ICD-10-CM | POA: Diagnosis not present

## 2018-11-03 LAB — CBC WITH DIFFERENTIAL/PLATELET
Abs Immature Granulocytes: 0.01 10*3/uL (ref 0.00–0.07)
Basophils Absolute: 0 10*3/uL (ref 0.0–0.1)
Basophils Relative: 1 %
Eosinophils Absolute: 0.1 10*3/uL (ref 0.0–0.5)
Eosinophils Relative: 4 %
HCT: 30.4 % — ABNORMAL LOW (ref 36.0–46.0)
Hemoglobin: 9.4 g/dL — ABNORMAL LOW (ref 12.0–15.0)
Immature Granulocytes: 0 %
Lymphocytes Relative: 16 %
Lymphs Abs: 0.5 10*3/uL — ABNORMAL LOW (ref 0.7–4.0)
MCH: 31.4 pg (ref 26.0–34.0)
MCHC: 30.9 g/dL (ref 30.0–36.0)
MCV: 101.7 fL — ABNORMAL HIGH (ref 80.0–100.0)
Monocytes Absolute: 0.3 10*3/uL (ref 0.1–1.0)
Monocytes Relative: 9 %
Neutro Abs: 2.2 10*3/uL (ref 1.7–7.7)
Neutrophils Relative %: 70 %
Platelets: 78 10*3/uL — ABNORMAL LOW (ref 150–400)
RBC: 2.99 MIL/uL — ABNORMAL LOW (ref 3.87–5.11)
RDW: 19.2 % — ABNORMAL HIGH (ref 11.5–15.5)
WBC: 3.1 10*3/uL — ABNORMAL LOW (ref 4.0–10.5)
nRBC: 0 % (ref 0.0–0.2)

## 2018-11-03 LAB — BASIC METABOLIC PANEL
Anion gap: 8 (ref 5–15)
BUN: 14 mg/dL (ref 8–23)
CO2: 29 mmol/L (ref 22–32)
Calcium: 9.1 mg/dL (ref 8.9–10.3)
Chloride: 96 mmol/L — ABNORMAL LOW (ref 98–111)
Creatinine, Ser: 4.18 mg/dL — ABNORMAL HIGH (ref 0.44–1.00)
GFR calc Af Amer: 11 mL/min — ABNORMAL LOW (ref >60)
GFR calc non Af Amer: 10 mL/min — ABNORMAL LOW (ref >60)
Glucose, Bld: 120 mg/dL — ABNORMAL HIGH (ref 70–99)
Potassium: 4.6 mmol/L (ref 3.5–5.1)
Sodium: 133 mmol/L — ABNORMAL LOW (ref 135–145)

## 2018-11-03 LAB — I-STAT TROPONIN, ED
Troponin i, poc: 0.06 ng/mL (ref 0.00–0.08)
Troponin i, poc: 0.07 ng/mL (ref 0.00–0.08)

## 2018-11-03 MED ORDER — ASPIRIN 81 MG PO CHEW
324.0000 mg | CHEWABLE_TABLET | Freq: Once | ORAL | Status: DC
  Filled 2018-11-03: qty 4

## 2018-11-03 MED ORDER — NITROGLYCERIN 0.4 MG SL SUBL: 0.4000 mg | SUBLINGUAL_TABLET | SUBLINGUAL | 0 refills | Status: DC | PRN

## 2018-11-03 NOTE — Discharge Instructions (Addendum)
If you have chest pain, put a nitroglycerin tablet under your tongue and let it dissolve.  If pain is not gone in 5 minutes, repeat the nitroglycerin pill.  If 3 doses of nitroglycerin do not take the pain away, come to the emergency department immediately.  Please follow-up with your cardiologist.  I think that you would benefit from having an event monitor done to see what heart rhythms are occurring.  I suspect that your chest pain was related to a change in your heart rhythm.

## 2018-11-03 NOTE — ED Notes (Signed)
Nurse drawing labs. 

## 2018-11-03 NOTE — ED Notes (Signed)
  Patient was undressed and inspected for bed bugs.  None were seen on patient or belongings.  Patient placed on contact precautions per bed bug protocol

## 2018-11-03 NOTE — ED Provider Notes (Signed)
[facility] EMERGENCY DEPARTMENT Provider Note   CSN: [id number] Arrival date & time: 11/03/18  0145     History   Chief Complaint Chief Complaint  Patient presents with  . Atrial Fibrillation    HPI [name] [name] is a 78 y.o. female.  The history is provided by the patient.  Atrial Fibrillation   She has history of hypertension, diabetes, hyperlipidemia, end-stage renal disease on hemodialysis, diastolic heart failure, NSTEMI who comes in with 2 episodes of chest pain today.  Patient is an extremely poor historian, but she states she had chest pain this afternoon and again just before calling the ambulance.  Pain lasted only a few minutes.  She describes a dull pain without dyspnea, nausea, diaphoresis.  There is no radiation of pain.  Nothing made it better, nothing made it worse.  She will was not aware of any palpitations.  There was no treatment at home.  EMS reported ECG showing atrial fibrillation with rapid ventricular response which spontaneously converted.  Of note, patient stated that she was actually pain-free when EMS arrived.  Past Medical History:  Diagnosis Date  . DEPRESSION   . DIABETES MELLITUS, TYPE I   . GERD   . GLAUCOMA   . Headache(784.0)   . Hepatitis B carrier ([place])    05/2009: neg Hep C; Hep B: core pos, Surf neg; fatty liver US 8/10 - 7/13  . HYPERLIPIDEMIA   . HYPERTENSION   . Kidney failure    dialysis 3 times per week  . OSTEOPENIA   . POSTMENOPAUSAL STATUS   . Pulmonary edema   . Unspecified vitamin D deficiency     Patient Active Problem List   Diagnosis Date Noted  . AV graft malfunction (HCC) 09/03/2018  . Urinary tract infection without hematuria 08/21/2018  . Memory loss 08/21/2018  . Anemia, chronic disease 03/01/2018  . Vasovagal syncope 03/01/2018  . Macrocytic anemia 01/28/2018  . Orthostatic syncope 01/28/2018  . EKG, abnormal   . Acute metabolic encephalopathy 63/10/6008  . Influenza 11/16/2017  .  Fever in adult 11/15/2017  . Demand ischemia ([place]) 11/10/2017  . Normocytic anemia 11/10/2017  . Aortic atherosclerosis ([place]) 11/10/2017  . Respiratory failure with hypoxia ([place]) 08/18/2017  . Volume overload 08/18/2017  . Dialysis patient, noncompliant ([place]) 08/17/2017  . Thumb pain, left 12/22/2016  . Chronic diastolic heart failure ([place]) 11/25/2016  . ESRD (end stage renal disease) on dialysis ([place]) 06/25/2014  . Malignant hypertension 05/17/2014  . Chest pain 05/17/2014  . Elevated troponin 05/17/2014  . Hypertensive emergency 05/17/2014  . NSTEMI (non-ST elevated myocardial infarction) ([place]) 05/17/2014  . Chronic kidney disease, stage IV (severe) ([place]) 12/05/2013  . GERD 06/17/2009  . Hepatitis B carrier ([place]) 06/17/2009  . DEPRESSION 05/20/2009  . Unspecified glaucoma 05/20/2009  . HEADACHE 05/20/2009  . CARDIAC MURMUR 05/20/2009  . OSTEOPENIA 02/15/2008  . POSTMENOPAUSAL STATUS 12/21/2007  . Hyperlipidemia LDL goal <70 09/21/2007  . Essential hypertension 09/13/2007  . Type II diabetes mellitus with nephropathy [facility])     Past Surgical History:  Procedure Laterality Date  . A/V SHUNTOGRAM N/A 09/06/2018   Procedure: A/V SHUNTOGRAM - Left AV;  Surgeon: Marty Heck, MD;  Location: [facility];  Service: Cardiovascular;  Laterality: N/A;  . AV FISTULA PLACEMENT Left 05/29/2014   Procedure: INSERTION OF ARTERIOVENOUS (AV) GORE-TEX GRAFT ARM-LEFT;  Surgeon: Angelia Mould, MD;  Location: [facility];  Service: Vascular;  Laterality: Left;  . CHOLECYSTECTOMY  02/12/07  .  INSERTION OF DIALYSIS CATHETER Right 05/27/2014   Procedure: INSERTION OF DIALYSIS CATHETER;  Surgeon: Angelia Mould, MD;  Location: [facility];  Service: Vascular;  Laterality: Right;  . PERIPHERAL VASCULAR BALLOON ANGIOPLASTY Left 09/06/2018   Procedure: PERIPHERAL VASCULAR BALLOON ANGIOPLASTY;  Surgeon: Marty Heck, MD;  Location: [facility];  Service: Cardiovascular;   Laterality: Left;  arm shunt  . REFRACTIVE SURGERY  07/29/09   Dr. Bing Plume  . TONSILLECTOMY       OB History   No obstetric history on file.      Home Medications    Prior to Admission medications   Medication Sig Start Date End Date Taking? Authorizing Provider  Amino Acids-Protein Hydrolys (FEEDING SUPPLEMENT, PRO-STAT SUGAR FREE 64,) LIQD Take 30 mLs by mouth 2 (two) times daily. 10/18/18   Kayleen Memos, DO  aspirin (ASPIRIN CHILDRENS) 81 MG chewable tablet Chew 1 tablet (81 mg total) by mouth daily. 10/18/18 10/18/19  Kayleen Memos, DO  B Complex-C-Zn-Folic Acid (DIALYVITE/ZINC) TABS Take 1 tablet by mouth daily.    [provider]  Multiple Vitamin (MULTIVITAMIN WITH MINERALS) TABS tablet Take 1 tablet by mouth daily. 10/19/18   Kayleen Memos, DO  sevelamer carbonate (RENVELA) 800 MG tablet Take 2 tablets (1,600 mg total) by mouth 3 (three) times daily with meals. 10/18/18   Kayleen Memos, DO    Family History Family History  Problem Relation Age of Onset  . Kidney disease Mother   . Coronary artery disease Other   . Diabetes Other   . Hypertension Other   . Arthritis Other   . Kidney disease Sister     Social History Social History   Tobacco Use  . Smoking status: Never Smoker  . Smokeless tobacco: Never Used  . Tobacco comment: Married x's 47yrs, 6 kids-scattered [employer]. Retired Consulting civil engineer  Substance Use Topics  . Alcohol use: No  . Drug use: No     Allergies   Hydralazine and Robaxin [methocarbamol]   Review of Systems Review of Systems  All other systems reviewed and are negative.    Physical Exam Updated Vital Signs BP (!) 114/58 (BP Location: Right Arm)   Pulse 92   Temp 98.3 F (36.8 C) (Oral)   Resp 18   Ht 5' 5.5" (1.664 m)   Wt 51.8 kg   SpO2 99%   BMI 18.71 kg/m   Physical Exam Vitals signs and nursing note reviewed.    78 year old female, resting comfortably and in no acute distress. Vital signs are normal.  Oxygen saturation is 99%, which is normal. Head is normocephalic and atraumatic. PERRLA, EOMI. Oropharynx is clear. Neck is nontender and supple without adenopathy or JVD. Back is nontender and there is no CVA tenderness. Lungs are clear without rales, wheezes, or rhonchi. Chest is nontender. Heart has regular rate and rhythm with 1-6/0 systolic ejection murmur best heard along left sternal border. Abdomen is soft, flat, nontender without masses or hepatosplenomegaly and peristalsis is normoactive. Extremities have no cyanosis or edema, full range of motion is present.  AV fistula is present in the left upper arm with thrill present. Skin is warm and dry without rash. Neurologic: Mental status is normal, cranial nerves are intact, there are no motor or sensory deficits.  ED Treatments / Results  Labs (all labs ordered are listed, but only abnormal results are displayed) Labs Reviewed  BASIC METABOLIC PANEL - Abnormal; Notable for the following components:  Result Value   Sodium 133 (*)    Chloride 96 (*)    Glucose, Bld 120 (*)    Creatinine, Ser 4.18 (*)    GFR calc non Af Amer 10 (*)    GFR calc Af Amer 11 (*)    All other components within normal limits  CBC WITH DIFFERENTIAL/PLATELET - Abnormal; Notable for the following components:   WBC 3.1 (*)    RBC 2.99 (*)    Hemoglobin 9.4 (*)    HCT 30.4 (*)    MCV 101.7 (*)    RDW 19.2 (*)    Platelets 78 (*)    Lymphs Abs 0.5 (*)    All other components within normal limits  I-STAT TROPONIN, ED  I-STAT TROPONIN, ED    EKG EKG Interpretation  Date/Time:  Saturday November 03 2018 05:23:54 EST Ventricular Rate:  75 PR Interval:    QRS Duration: 89 QT Interval:  445 QTC Calculation: 498 R Axis:   19 Text Interpretation:  Sinus rhythm Left ventricular hypertrophy Borderline prolonged QT interval When compared with ECG of 10/14/2018, QT has shortened T wave inversion has resolved Confirmed by Delora Fuel ([id number]) on  11/03/2018 5:31:31 AM   Radiology Dg Chest Port 1 View  Result Date: 11/03/2018 CLINICAL DATA:  Acute onset of tachycardia and atrial fibrillation. EXAM: PORTABLE CHEST 1 VIEW COMPARISON:  Chest radiograph performed 10/14/2018 FINDINGS: The lungs are well-aerated and clear. There is no evidence of focal opacification, pleural effusion or pneumothorax. The cardiomediastinal silhouette is borderline enlarged. No acute osseous abnormalities are seen. IMPRESSION: Borderline cardiomegaly; no acute cardiopulmonary process seen. Electronically Signed   By: Garald Balding M.D.   On: 11/03/2018 02:48    Procedures Procedures   Medications Ordered in ED Medications  aspirin chewable tablet 324 mg (324 mg Oral Not Given 11/03/18 0254)     Initial Impression / Assessment and Plan / ED Course  I have reviewed the triage vital signs and the nursing notes.  Pertinent labs & imaging results that were available during my care of the patient were reviewed by me and considered in my medical decision making (see chart for details).  2 episodes of chest pain which have resolved.  I have reviewed the ECGs obtained in the ambulance, and she had a regular supraventricular tachycardia which then converted to sinus rhythm.  Old records were reviewed, and her episode of non-STEMI was felt to be due to demand ischemia.  Cardiology note mentioned episode of palpitations, but patient declined work-up.  She did have a stress echocardiogram which was low risk, and has known aortic valvular sclerosis.  Troponin is normal.  Patient continues to be pain-free.  She was held in the ED for delta troponin which is also normal.  She is felt to be safe for discharge at this point.  She is discharged with prescription for nitroglycerin, and is referred back to her cardiologist for consideration for outpatient event monitor.  I strongly suspect that her chest pain was related to the episode of supraventricular tachycardia.  Final  Clinical Impressions(s) / ED Diagnoses   Final diagnoses:  Chest pain  Nonspecific chest pain  SVT (supraventricular tachycardia) (HCC)  End-stage renal disease on hemodialysis (HCC)  Anemia associated with chronic renal failure    ED Discharge Orders         Ordered    nitroGLYCERIN (NITROSTAT) 0.4 MG SL tablet  Every 5 min PRN     11/03/18 2952  Delora Fuel, MD 78/93/81 [phone]

## 2018-11-03 NOTE — ED Triage Notes (Signed)
  Patient BIB EMS from home with tachycardia and afib.  Patient had HR in the 130-140s on EMS arrival in Afib but converted down to 90s in route.  Patient states she has no pain or SOB.  Patient is infected with bed bugs per EMS, and were seen throughout the house.  Patient is A&O x4.

## 2018-11-05 DIAGNOSIS — N2581 Secondary hyperparathyroidism of renal origin: Secondary | ICD-10-CM | POA: Diagnosis not present

## 2018-11-05 DIAGNOSIS — N186 End stage renal disease: Secondary | ICD-10-CM | POA: Diagnosis not present

## 2018-11-07 DIAGNOSIS — N2581 Secondary hyperparathyroidism of renal origin: Secondary | ICD-10-CM | POA: Diagnosis not present

## 2018-11-07 DIAGNOSIS — N186 End stage renal disease: Secondary | ICD-10-CM | POA: Diagnosis not present

## 2018-11-09 DIAGNOSIS — N2581 Secondary hyperparathyroidism of renal origin: Secondary | ICD-10-CM | POA: Diagnosis not present

## 2018-11-09 DIAGNOSIS — N186 End stage renal disease: Secondary | ICD-10-CM | POA: Diagnosis not present

## 2018-11-11 DIAGNOSIS — R000 Tachycardia, unspecified: Secondary | ICD-10-CM | POA: Diagnosis not present

## 2018-11-11 DIAGNOSIS — I10 Essential (primary) hypertension: Secondary | ICD-10-CM | POA: Diagnosis not present

## 2018-11-12 DIAGNOSIS — N186 End stage renal disease: Secondary | ICD-10-CM | POA: Diagnosis not present

## 2018-11-12 DIAGNOSIS — N2581 Secondary hyperparathyroidism of renal origin: Secondary | ICD-10-CM | POA: Diagnosis not present

## 2018-11-16 DIAGNOSIS — N2581 Secondary hyperparathyroidism of renal origin: Secondary | ICD-10-CM | POA: Diagnosis not present

## 2018-11-16 DIAGNOSIS — N186 End stage renal disease: Secondary | ICD-10-CM | POA: Diagnosis not present

## 2018-11-21 DIAGNOSIS — N186 End stage renal disease: Secondary | ICD-10-CM | POA: Diagnosis not present

## 2018-11-21 DIAGNOSIS — N2581 Secondary hyperparathyroidism of renal origin: Secondary | ICD-10-CM | POA: Diagnosis not present

## 2018-11-23 ENCOUNTER — Emergency Department (HOSPITAL_COMMUNITY)
Admission: EM | Admit: 2018-11-23 | Discharge: 2018-11-23 | Disposition: A | Discharge status: Home / Self Care | Payer: Medicare HMO | Attending: Emergency Medicine | Admitting: Emergency Medicine

## 2018-11-23 ENCOUNTER — Emergency Department (HOSPITAL_COMMUNITY): Payer: Medicare HMO

## 2018-11-23 DIAGNOSIS — J90 Pleural effusion, not elsewhere classified: Secondary | ICD-10-CM | POA: Diagnosis not present

## 2018-11-23 DIAGNOSIS — Z7982 Long term (current) use of aspirin: Secondary | ICD-10-CM | POA: Insufficient documentation

## 2018-11-23 DIAGNOSIS — F329 Major depressive disorder, single episode, unspecified: Secondary | ICD-10-CM | POA: Insufficient documentation

## 2018-11-23 DIAGNOSIS — R42 Dizziness and giddiness: Secondary | ICD-10-CM | POA: Diagnosis not present

## 2018-11-23 DIAGNOSIS — N186 End stage renal disease: Secondary | ICD-10-CM | POA: Insufficient documentation

## 2018-11-23 DIAGNOSIS — Z79899 Other long term (current) drug therapy: Secondary | ICD-10-CM | POA: Insufficient documentation

## 2018-11-23 DIAGNOSIS — R5383 Other fatigue: Secondary | ICD-10-CM | POA: Diagnosis not present

## 2018-11-23 DIAGNOSIS — I252 Old myocardial infarction: Secondary | ICD-10-CM | POA: Diagnosis not present

## 2018-11-23 DIAGNOSIS — E860 Dehydration: Secondary | ICD-10-CM | POA: Diagnosis not present

## 2018-11-23 DIAGNOSIS — I2510 Atherosclerotic heart disease of native coronary artery without angina pectoris: Secondary | ICD-10-CM | POA: Diagnosis not present

## 2018-11-23 DIAGNOSIS — E1122 Type 2 diabetes mellitus with diabetic chronic kidney disease: Secondary | ICD-10-CM | POA: Insufficient documentation

## 2018-11-23 DIAGNOSIS — I498 Other specified cardiac arrhythmias: Secondary | ICD-10-CM | POA: Diagnosis not present

## 2018-11-23 DIAGNOSIS — R008 Other abnormalities of heart beat: Secondary | ICD-10-CM | POA: Diagnosis not present

## 2018-11-23 DIAGNOSIS — I132 Hypertensive heart and chronic kidney disease with heart failure and with stage 5 chronic kidney disease, or end stage renal disease: Secondary | ICD-10-CM | POA: Diagnosis not present

## 2018-11-23 DIAGNOSIS — Z9049 Acquired absence of other specified parts of digestive tract: Secondary | ICD-10-CM | POA: Diagnosis not present

## 2018-11-23 DIAGNOSIS — Z992 Dependence on renal dialysis: Secondary | ICD-10-CM | POA: Diagnosis not present

## 2018-11-23 DIAGNOSIS — R531 Weakness: Secondary | ICD-10-CM | POA: Diagnosis present

## 2018-11-23 DIAGNOSIS — I5032 Chronic diastolic (congestive) heart failure: Secondary | ICD-10-CM | POA: Diagnosis not present

## 2018-11-23 DIAGNOSIS — I499 Cardiac arrhythmia, unspecified: Secondary | ICD-10-CM

## 2018-11-23 LAB — BASIC METABOLIC PANEL
Anion gap: 15 (ref 5–15)
BUN: 52 mg/dL — ABNORMAL HIGH (ref 8–23)
CO2: 23 mmol/L (ref 22–32)
Calcium: 8.9 mg/dL (ref 8.9–10.3)
Chloride: 102 mmol/L (ref 98–111)
Creatinine, Ser: 7.14 mg/dL — ABNORMAL HIGH (ref 0.44–1.00)
GFR calc Af Amer: 6 mL/min — ABNORMAL LOW (ref >60)
GFR calc non Af Amer: 5 mL/min — ABNORMAL LOW (ref >60)
Glucose, Bld: 107 mg/dL — ABNORMAL HIGH (ref 70–99)
Potassium: 3.9 mmol/L (ref 3.5–5.1)
Sodium: 140 mmol/L (ref 135–145)

## 2018-11-23 LAB — URINALYSIS, ROUTINE W REFLEX MICROSCOPIC
Bilirubin Urine: NEGATIVE
Glucose, UA: NEGATIVE mg/dL
Ketones, ur: NEGATIVE mg/dL
Nitrite: NEGATIVE
Protein, ur: 100 mg/dL — AB
Specific Gravity, Urine: 1.016 (ref 1.005–1.030)
pH: 6 (ref 5.0–8.0)

## 2018-11-23 LAB — TSH: TSH: 1.158 u[IU]/mL (ref 0.350–4.500)

## 2018-11-23 LAB — CBC
HCT: 26.4 % — ABNORMAL LOW (ref 36.0–46.0)
Hemoglobin: 7.9 g/dL — ABNORMAL LOW (ref 12.0–15.0)
MCH: 31.1 pg (ref 26.0–34.0)
MCHC: 29.9 g/dL — ABNORMAL LOW (ref 30.0–36.0)
MCV: 103.9 fL — ABNORMAL HIGH (ref 80.0–100.0)
Platelets: 69 10*3/uL — ABNORMAL LOW (ref 150–400)
RBC: 2.54 MIL/uL — AB (ref 3.87–5.11)
RDW: 19.4 % — ABNORMAL HIGH (ref 11.5–15.5)
WBC: 3.1 10*3/uL — ABNORMAL LOW (ref 4.0–10.5)
nRBC: 0 % (ref 0.0–0.2)

## 2018-11-23 LAB — HEPATIC FUNCTION PANEL
ALT: 20 U/L (ref 0–44)
AST: 27 U/L (ref 15–41)
Albumin: 3 g/dL — ABNORMAL LOW (ref 3.5–5.0)
Alkaline Phosphatase: 116 U/L (ref 38–126)
BILIRUBIN INDIRECT: 0.7 mg/dL (ref 0.3–0.9)
Bilirubin, Direct: 0.2 mg/dL (ref 0.0–0.2)
Total Bilirubin: 0.9 mg/dL (ref 0.3–1.2)
Total Protein: 7.4 g/dL (ref 6.5–8.1)

## 2018-11-23 LAB — I-STAT TROPONIN, ED
TROPONIN I, POC: 0.12 ng/mL — AB (ref 0.00–0.08)
Troponin i, poc: 0.13 ng/mL (ref 0.00–0.08)

## 2018-11-23 LAB — LIPASE, BLOOD: Lipase: 51 U/L (ref 11–51)

## 2018-11-23 LAB — POC OCCULT BLOOD, ED: Fecal Occult Bld: NEGATIVE

## 2018-11-23 LAB — PROTIME-INR
INR: 1.13
Prothrombin Time: 14.4 seconds (ref 11.4–15.2)

## 2018-11-23 LAB — MAGNESIUM: Magnesium: 1.9 mg/dL (ref 1.7–2.4)

## 2018-11-23 MED ORDER — SODIUM CHLORIDE 0.9 % IV BOLUS
1000.0000 mL | Freq: Once | INTRAVENOUS | Status: AC
  Administered 2018-11-23: 1000 mL via INTRAVENOUS

## 2018-11-23 NOTE — ED Provider Notes (Signed)
[facility] EMERGENCY DEPARTMENT Provider Note   CSN: [id number] Arrival date & time: 11/23/18  1136     History   Chief Complaint Chief Complaint  Patient presents with  . Weakness    HPI [name] [name] is a 78 y.o. female.  The history is provided by the patient, the spouse and medical records. No language interpreter was used.  Illness  Quality:  Fatigue Severity:  Severe Onset quality:  Gradual Duration:  3 weeks Timing:  Constant Progression:  Worsening Chronicity:  New Associated symptoms: cough, diarrhea (resolved ) and fatigue   Associated symptoms: no abdominal pain, no chest pain, no congestion, no fever, no headaches, no loss of consciousness, no nausea, no rash, no rhinorrhea, no shortness of breath, no vomiting and no wheezing     Past Medical History:  Diagnosis Date  . DEPRESSION   . DIABETES MELLITUS, TYPE I   . GERD   . GLAUCOMA   . Headache(784.0)   . Hepatitis B carrier ([place])    05/2009: neg Hep C; Hep B: core pos, Surf neg; fatty liver US 8/10 - 7/13  . HYPERLIPIDEMIA   . HYPERTENSION   . Kidney failure    dialysis 3 times per week  . OSTEOPENIA   . POSTMENOPAUSAL STATUS   . Pulmonary edema   . Unspecified vitamin D deficiency     Patient Active Problem List   Diagnosis Date Noted  . AV graft malfunction (HCC) 09/03/2018  . Urinary tract infection without hematuria 08/21/2018  . Memory loss 08/21/2018  . Anemia, chronic disease 03/01/2018  . Vasovagal syncope 03/01/2018  . Macrocytic anemia 01/28/2018  . Orthostatic syncope 01/28/2018  . EKG, abnormal   . Acute metabolic encephalopathy 58/07/9832  . Influenza 11/16/2017  . Fever in adult 11/15/2017  . Demand ischemia ([place]) 11/10/2017  . Normocytic anemia 11/10/2017  . Aortic atherosclerosis ([place]) 11/10/2017  . Respiratory failure with hypoxia ([place]) 08/18/2017  . Volume overload 08/18/2017  . Dialysis patient, noncompliant ([place]) 08/17/2017  . Thumb pain, left  12/22/2016  . Chronic diastolic heart failure ([place]) 11/25/2016  . ESRD (end stage renal disease) on dialysis ([place]) 06/25/2014  . Malignant hypertension 05/17/2014  . Chest pain 05/17/2014  . Elevated troponin 05/17/2014  . Hypertensive emergency 05/17/2014  . NSTEMI (non-ST elevated myocardial infarction) ([place]) 05/17/2014  . Chronic kidney disease, stage IV (severe) ([place]) 12/05/2013  . GERD 06/17/2009  . Hepatitis B carrier ([place]) 06/17/2009  . DEPRESSION 05/20/2009  . Unspecified glaucoma 05/20/2009  . HEADACHE 05/20/2009  . CARDIAC MURMUR 05/20/2009  . OSTEOPENIA 02/15/2008  . POSTMENOPAUSAL STATUS 12/21/2007  . Hyperlipidemia LDL goal <70 09/21/2007  . Essential hypertension 09/13/2007  . Type II diabetes mellitus with nephropathy [facility])     Past Surgical History:  Procedure Laterality Date  . A/V SHUNTOGRAM N/A 09/06/2018   Procedure: A/V SHUNTOGRAM - Left AV;  Surgeon: Marty Heck, MD;  Location: [facility];  Service: Cardiovascular;  Laterality: N/A;  . AV FISTULA PLACEMENT Left 05/29/2014   Procedure: INSERTION OF ARTERIOVENOUS (AV) GORE-TEX GRAFT ARM-LEFT;  Surgeon: Angelia Mould, MD;  Location: [facility];  Service: Vascular;  Laterality: Left;  . CHOLECYSTECTOMY  02/12/07  . INSERTION OF DIALYSIS CATHETER Right 05/27/2014   Procedure: INSERTION OF DIALYSIS CATHETER;  Surgeon: Angelia Mould, MD;  Location: [facility];  Service: Vascular;  Laterality: Right;  . PERIPHERAL VASCULAR BALLOON ANGIOPLASTY Left 09/06/2018   Procedure: PERIPHERAL VASCULAR BALLOON ANGIOPLASTY;  Surgeon: Carlis Abbott,  Gwenyth Allegra, MD;  Location: [facility];  Service: Cardiovascular;  Laterality: Left;  arm shunt  . REFRACTIVE SURGERY  07/29/09   Dr. Bing Plume  . TONSILLECTOMY       OB History   No obstetric history on file.      Home Medications    Prior to Admission medications   Medication Sig Start Date End Date Taking? Authorizing Provider  Amino Acids-Protein Hydrolys  (FEEDING SUPPLEMENT, PRO-STAT SUGAR FREE 64,) LIQD Take 30 mLs by mouth 2 (two) times daily. 10/18/18   Kayleen Memos, DO  aspirin (ASPIRIN CHILDRENS) 81 MG chewable tablet Chew 1 tablet (81 mg total) by mouth daily. 10/18/18 10/18/19  Kayleen Memos, DO  B Complex-C-Zn-Folic Acid (DIALYVITE/ZINC) TABS Take 1 tablet by mouth daily.    [provider]  Multiple Vitamin (MULTIVITAMIN WITH MINERALS) TABS tablet Take 1 tablet by mouth daily. 10/19/18   Kayleen Memos, DO  nitroGLYCERIN (NITROSTAT) 0.4 MG SL tablet Place 1 tablet (0.4 mg total) under the tongue every 5 (five) minutes as needed for chest pain. 3/0/86   Delora Fuel, MD  sevelamer carbonate (RENVELA) 800 MG tablet Take 2 tablets (1,600 mg total) by mouth 3 (three) times daily with meals. 10/18/18   Kayleen Memos, DO    Family History Family History  Problem Relation Age of Onset  . Kidney disease Mother   . Coronary artery disease Other   . Diabetes Other   . Hypertension Other   . Arthritis Other   . Kidney disease Sister     Social History Social History   Tobacco Use  . Smoking status: Never Smoker  . Smokeless tobacco: Never Used  . Tobacco comment: Married x's 38yrs, 6 kids-scattered [employer]. Retired Consulting civil engineer  Substance Use Topics  . Alcohol use: No  . Drug use: No     Allergies   Hydralazine and Robaxin [methocarbamol]   Review of Systems Review of Systems  Constitutional: Positive for chills and fatigue. Negative for diaphoresis and fever.  HENT: Negative for congestion and rhinorrhea.   Eyes: Negative for visual disturbance.  Respiratory: Positive for cough. Negative for chest tightness, shortness of breath, wheezing and stridor.   Cardiovascular: Negative for chest pain.  Gastrointestinal: Positive for diarrhea (resolved ). Negative for abdominal pain, constipation, nausea and vomiting.  Genitourinary: Negative for dysuria and flank pain.  Musculoskeletal: Negative for back pain, neck  pain and neck stiffness.  Skin: Negative for rash and wound.  Neurological: Positive for light-headedness. Negative for dizziness, loss of consciousness, syncope, speech difficulty and headaches.  Psychiatric/Behavioral: Negative for agitation.  All other systems reviewed and are negative.    Physical Exam Updated Vital Signs BP 109/66 (BP Location: Right Arm)   Pulse 71   Temp 98.4 F (36.9 C) (Oral)   Resp 18   SpO2 100%   Physical Exam Vitals signs and nursing note reviewed.  Constitutional:      General: She is not in acute distress.    Appearance: She is well-developed. She is not ill-appearing, toxic-appearing or diaphoretic.  HENT:     Head: Normocephalic and atraumatic.     Nose: Nose normal. No congestion or rhinorrhea.     Mouth/Throat:     Pharynx: No oropharyngeal exudate or posterior oropharyngeal erythema.  Eyes:     Extraocular Movements: Extraocular movements intact.     Conjunctiva/sclera: Conjunctivae normal.     Pupils: Pupils are equal, round, and reactive to light.  Neck:  Musculoskeletal: Neck supple. No muscular tenderness.  Cardiovascular:     Rate and Rhythm: Bradycardia present. Rhythm irregular.     Heart sounds: Murmur present.  Pulmonary:     Effort: Pulmonary effort is normal. No respiratory distress.     Breath sounds: Normal breath sounds. No wheezing, rhonchi or rales.  Chest:     Chest wall: No tenderness.  Abdominal:     General: There is no distension.     Palpations: Abdomen is soft.     Tenderness: There is no abdominal tenderness.  Musculoskeletal:        General: No tenderness.     Right lower leg: No edema.     Left lower leg: No edema.  Skin:    General: Skin is warm and dry.     Capillary Refill: Capillary refill takes less than 2 seconds.  Neurological:     General: No focal deficit present.     Mental Status: She is alert.      ED Treatments / Results  Labs (all labs ordered are listed, but only abnormal  results are displayed) Labs Reviewed  BASIC METABOLIC PANEL - Abnormal; Notable for the following components:      Result Value   Glucose, Bld 107 (*)    BUN 52 (*)    Creatinine, Ser 7.14 (*)    GFR calc non Af Amer 5 (*)    GFR calc Af Amer 6 (*)    All other components within normal limits  CBC - Abnormal; Notable for the following components:   WBC 3.1 (*)    RBC 2.54 (*)    Hemoglobin 7.9 (*)    HCT 26.4 (*)    MCV 103.9 (*)    MCHC 29.9 (*)    RDW 19.4 (*)    Platelets 69 (*)    All other components within normal limits  URINALYSIS, ROUTINE W REFLEX MICROSCOPIC - Abnormal; Notable for the following components:   Hgb urine dipstick SMALL (*)    Protein, ur 100 (*)    Leukocytes, UA SMALL (*)    Bacteria, UA RARE (*)    All other components within normal limits  HEPATIC FUNCTION PANEL - Abnormal; Notable for the following components:   Albumin 3.0 (*)    All other components within normal limits  I-STAT TROPONIN, ED - Abnormal; Notable for the following components:   Troponin i, poc 0.13 (*)    All other components within normal limits  I-STAT TROPONIN, ED - Abnormal; Notable for the following components:   Troponin i, poc 0.12 (*)    All other components within normal limits  URINE CULTURE  PROTIME-INR  LIPASE, BLOOD  TSH  MAGNESIUM  POC OCCULT BLOOD, ED    EKG EKG Interpretation  Date/Time:  Friday November 23 2018 11:49:25 EST Ventricular Rate:  81 PR Interval:  156 QRS Duration: 80 QT Interval:  402 QTC Calculation: 466 R Axis:   24 Text Interpretation:  Sinus rhythm with frequent and consecutive Premature ventricular complexes ST & T wave abnormality, consider inferolateral ischemia Abnormal ECG When compared to prior, new t wave ivnersionsin leads V4-V6. more PVC/  no STEMI Confirmed by Antony Blackbird [phone]) on 11/23/2018 3:21:50 PM   Radiology Dg Chest 2 View  Result Date: 11/23/2018 CLINICAL DATA:  Chronic weakness. EXAM: CHEST - 2 VIEW  COMPARISON:  Single-view of the chest 11/03/2018. PA and lateral chest 10/14/2018. FINDINGS: There is cardiomegaly without edema. Aortic atherosclerosis noted. Trace right pleural effusion  is seen. No consolidative process or pneumothorax. No acute or focal bony abnormality. IMPRESSION: Trace right pleural effusion. Cardiomegaly without edema. Atherosclerosis. Electronically Signed   By: Inge Rise M.D.   On: 11/23/2018 16:28    Procedures Procedures (including critical care time)  Medications Ordered in ED Medications  sodium chloride 0.9 % bolus 1,000 mL (0 mLs Intravenous Stopped 11/23/18 1921)     Initial Impression / Assessment and Plan / ED Course  I have reviewed the triage vital signs and the nursing notes.  Pertinent labs & imaging results that were available during my care of the patient were reviewed by me and considered in my medical decision making (see chart for details).     [name] [name] is a 78 y.o. female with a past medical history significant for hypertension, hyperlipidemia, diabetes, and ESRD, CAD with prior end STEMI, and hepatitis B carrier who presents with severe fatigue, mild chills, cough, and dark stools.  Patient reports that for months she has been having mild fatigue but over the last week or 2 it has severely worsened.  She reports that is difficult for her to even ambulate without feeling severely lightheaded.  She reports occasionally feels palpitations but is not felt any today.  She reports no significant chest pain or shortness of breath but feels as if she is going to pass out.  She reports that she has had some dark stools but has not seen blood in her bowel movements.  She denies nausea or vomiting.  She reports that she had diarrhea several weeks ago that has resolved.  She denies any change with urination.  She denies any new trauma.  She denies any neurologic deficits or other complaints.   Patient reports that she was was to get dialysis today  but was too tired.  She says that she also did not take dialysis on Monday due to being too fatigued.  She reportedly took dialysis normally on Wednesday.  On exam, lungs are clear.  Chest is nontender.  Patient has a loud systolic murmur.  Abdomen is nontender.  Legs are nontender.  Patient will have rectal exam with fecal occult testing due to the dark stool report.  Patient's telemetry monitoring shows some irregularity, EKG will be reviewed.  Anticipate reassessment after lab testing and work-up.  3:41 PM Patient telemetry alarms were reviewed and patient's pulse is occasionally measured in the mid to low 30s.  There was one episode in the 20s.  It appears patient is in a bigeminy pattern in the 60s and 70s which when I checked her pulse was indeed peripherally perfusing in the 30s.  We will continue work-up.  4:45 PM Blood pressure trended into the 25D systolic.  She will be given a liter of fluids.  Initial troponin came back positive at 0.13.  Chart shows that patient has had elevated troponin in the past.  5:41 PM Hemoglobin appears to be always anemic in the 7-9 range.  Patient refused DRE for fecal occult testing at this time.  Patient continues to go into a bigeminy pattern with pulse in the 30s.  Patient still feels lightheaded and fatigued.  After fluids, will reassess patient's symptoms and blood pressure.  Patient is still feeling fatigued and lightheaded and hypotensive in the setting of her results, patient will likely require admission.  After fluids, pressures improved to over 664 systolic.  Patient was able to walk around without any lightheadedness or dizziness.  Patient was feeling much better.  Patient had improvement in her heart rhythm and did not have further episodes of the bigeminy.  Patient no longer had bradycardic episodes.  Given the patient's persistently elevated troponin, her arrhythmia, and the soft blood pressures intermittently, patient was offered  admission however she would rather be discharged home.  Patient reports that she will push hydration and both call and follow-up with her nephrologist and PCP.  Patient understood that with her low blood pressures, and her elevated troponin, this could be indicative of some heart injury however patient does not want admission.  Patient's fecal occult was negative which is reassuring.  Patient understood return precautions and follow-up instructions.  Patient other questions or concerns and was discharged in good condition with greatly improved symptoms of lightheadedness and fatigue after rehydration.  Final Clinical Impressions(s) / ED Diagnoses   Final diagnoses:  Dehydration  Fatigue, unspecified type  Ventricular bigeminy    ED Discharge Orders    None     Clinical Impression: 1. Dehydration   2. Fatigue, unspecified type   3. Ventricular bigeminy     Disposition: Discharge  Condition: Good  I have discussed the results, Dx and Tx plan with the pt(& family if present). He/she/they expressed understanding and agree(s) with the plan. Discharge instructions discussed at great length. Strict return precautions discussed and pt &/or family have verbalized understanding of the instructions. No further questions at time of discharge.    Discharge Medication List as of 11/23/2018 10:04 PM      Follow Up: Ann Held, DO [address] [place] [address] [phone]     Your cardiology team     [place] [address] [id number] mc [place]       , Gwenyth Allegra, MD 11/23/18 [phone]

## 2018-11-23 NOTE — Discharge Instructions (Signed)
Your work-up today showed evidence of dehydration likely leading to your fatigue.  You had similar anemia to prior and your rectal exam was negative for bleeding.  We saw the irregular rhythm, ventricular bigeminy, on your telemetry however after fluids were feeling much better and your blood pressure improved.  We offered admission however you requested discharge home.  Please follow-up with your primary doctor and PCP.  If any symptoms change or worsen, please return to the nearest emergency department.  Please stay hydrated and follow-up with your nephrology team.

## 2018-11-23 NOTE — ED Triage Notes (Signed)
Pt to ER for evaluation of weakness. Reports this has been an ongoing issue. No neuro deficits. Reports recent GI symptoms nausea and vomiting last week. NAD. Dialysis patient.

## 2018-11-24 LAB — URINE CULTURE: Culture: <10000 — AB

## 2018-11-26 DIAGNOSIS — N186 End stage renal disease: Secondary | ICD-10-CM | POA: Diagnosis not present

## 2018-11-26 DIAGNOSIS — N2581 Secondary hyperparathyroidism of renal origin: Secondary | ICD-10-CM | POA: Diagnosis not present

## 2018-11-28 DIAGNOSIS — N186 End stage renal disease: Secondary | ICD-10-CM | POA: Diagnosis not present

## 2018-11-28 DIAGNOSIS — N2581 Secondary hyperparathyroidism of renal origin: Secondary | ICD-10-CM | POA: Diagnosis not present

## 2018-11-30 DIAGNOSIS — E1122 Type 2 diabetes mellitus with diabetic chronic kidney disease: Secondary | ICD-10-CM | POA: Diagnosis not present

## 2018-11-30 DIAGNOSIS — Z992 Dependence on renal dialysis: Secondary | ICD-10-CM | POA: Diagnosis not present

## 2018-11-30 DIAGNOSIS — N186 End stage renal disease: Secondary | ICD-10-CM | POA: Diagnosis not present

## 2018-11-30 DIAGNOSIS — N2581 Secondary hyperparathyroidism of renal origin: Secondary | ICD-10-CM | POA: Diagnosis not present

## 2018-12-03 DIAGNOSIS — N2581 Secondary hyperparathyroidism of renal origin: Secondary | ICD-10-CM | POA: Diagnosis not present

## 2018-12-03 DIAGNOSIS — N186 End stage renal disease: Secondary | ICD-10-CM | POA: Diagnosis not present

## 2018-12-05 DIAGNOSIS — N2581 Secondary hyperparathyroidism of renal origin: Secondary | ICD-10-CM | POA: Diagnosis not present

## 2018-12-05 DIAGNOSIS — N186 End stage renal disease: Secondary | ICD-10-CM | POA: Diagnosis not present

## 2018-12-07 DIAGNOSIS — N186 End stage renal disease: Secondary | ICD-10-CM | POA: Diagnosis not present

## 2018-12-07 DIAGNOSIS — N2581 Secondary hyperparathyroidism of renal origin: Secondary | ICD-10-CM | POA: Diagnosis not present

## 2018-12-08 ENCOUNTER — Emergency Department (HOSPITAL_COMMUNITY)
Admission: EM | Admit: 2018-12-08 | Discharge: 2018-12-08 | Disposition: A | Discharge status: Home / Self Care | Payer: Medicare HMO | Attending: Emergency Medicine | Admitting: Emergency Medicine

## 2018-12-08 ENCOUNTER — Emergency Department (HOSPITAL_COMMUNITY): Payer: Medicare HMO

## 2018-12-08 ENCOUNTER — Encounter (HOSPITAL_COMMUNITY): Payer: Self-pay

## 2018-12-08 DIAGNOSIS — W1830XA Fall on same level, unspecified, initial encounter: Secondary | ICD-10-CM | POA: Diagnosis not present

## 2018-12-08 DIAGNOSIS — E119 Type 2 diabetes mellitus without complications: Secondary | ICD-10-CM | POA: Diagnosis not present

## 2018-12-08 DIAGNOSIS — Y999 Unspecified external cause status: Secondary | ICD-10-CM | POA: Diagnosis not present

## 2018-12-08 DIAGNOSIS — S299XXA Unspecified injury of thorax, initial encounter: Secondary | ICD-10-CM | POA: Diagnosis not present

## 2018-12-08 DIAGNOSIS — Z79899 Other long term (current) drug therapy: Secondary | ICD-10-CM | POA: Insufficient documentation

## 2018-12-08 DIAGNOSIS — N186 End stage renal disease: Secondary | ICD-10-CM | POA: Diagnosis not present

## 2018-12-08 DIAGNOSIS — S8992XA Unspecified injury of left lower leg, initial encounter: Secondary | ICD-10-CM | POA: Diagnosis not present

## 2018-12-08 DIAGNOSIS — I120 Hypertensive chronic kidney disease with stage 5 chronic kidney disease or end stage renal disease: Secondary | ICD-10-CM | POA: Insufficient documentation

## 2018-12-08 DIAGNOSIS — S8991XA Unspecified injury of right lower leg, initial encounter: Secondary | ICD-10-CM | POA: Diagnosis not present

## 2018-12-08 DIAGNOSIS — S82045A Nondisplaced comminuted fracture of left patella, initial encounter for closed fracture: Secondary | ICD-10-CM | POA: Insufficient documentation

## 2018-12-08 DIAGNOSIS — Y929 Unspecified place or not applicable: Secondary | ICD-10-CM | POA: Insufficient documentation

## 2018-12-08 DIAGNOSIS — Y939 Activity, unspecified: Secondary | ICD-10-CM | POA: Insufficient documentation

## 2018-12-08 DIAGNOSIS — Z992 Dependence on renal dialysis: Secondary | ICD-10-CM | POA: Diagnosis not present

## 2018-12-08 DIAGNOSIS — M25561 Pain in right knee: Secondary | ICD-10-CM | POA: Diagnosis not present

## 2018-12-08 MED ORDER — HYDROCODONE-ACETAMINOPHEN 5-325 MG PO TABS: 1.0000 | ORAL_TABLET | Freq: Four times a day (QID) | ORAL | 0 refills | Status: DC | PRN

## 2018-12-08 MED ORDER — TRAMADOL HCL 50 MG PO TABS
50.0000 mg | ORAL_TABLET | Freq: Once | ORAL | Status: AC
  Administered 2018-12-08: 50 mg via ORAL
  Filled 2018-12-08: qty 1

## 2018-12-08 NOTE — Progress Notes (Signed)
Orthopedic Tech Progress Note Patient Details:  [name] [name] 1941/04/26 [id number]  Ortho Devices Type of Ortho Device: Knee Immobilizer Ortho Device/Splint Interventions: Application   Post Interventions Patient Tolerated: Well Instructions Provided: Care of device   [name] 12/08/2018, 1:47 PM

## 2018-12-08 NOTE — ED Notes (Signed)
Patient transported to X-ray 

## 2018-12-08 NOTE — ED Provider Notes (Signed)
[facility] EMERGENCY DEPARTMENT Provider Note   CSN: [id number] Arrival date & time: 12/08/18  1044     History   Chief Complaint Chief Complaint  Patient presents with  . Fall    HPI [name] [name] is a 78 y.o. female history of diabetes, hypertension, hyperlipidemia here presenting with fall.  Patient tripped over something and a mechanical fall and did hit her face yesterday as well as bilateral knees.  Patient states that she is able to bear weight but has worsening left knee pain.  Denies any headache or vomiting.  No meds prior to arrival. Of note, patient is a dialysis patient and had full treatment yesterday and denies any chest pain or SOB.   The history is provided by the patient and a relative.    Past Medical History:  Diagnosis Date  . DEPRESSION   . DIABETES MELLITUS, TYPE I   . GERD   . GLAUCOMA   . Headache(784.0)   . Hepatitis B carrier ([place])    05/2009: neg Hep C; Hep B: core pos, Surf neg; fatty liver US 8/10 - 7/13  . HYPERLIPIDEMIA   . HYPERTENSION   . Kidney failure    dialysis 3 times per week  . OSTEOPENIA   . POSTMENOPAUSAL STATUS   . Pulmonary edema   . Unspecified vitamin D deficiency     Patient Active Problem List   Diagnosis Date Noted  . AV graft malfunction (HCC) 09/03/2018  . Urinary tract infection without hematuria 08/21/2018  . Memory loss 08/21/2018  . Anemia, chronic disease 03/01/2018  . Vasovagal syncope 03/01/2018  . Macrocytic anemia 01/28/2018  . Orthostatic syncope 01/28/2018  . EKG, abnormal   . Acute metabolic encephalopathy 40/97/3532  . Influenza 11/16/2017  . Fever in adult 11/15/2017  . Demand ischemia ([place]) 11/10/2017  . Normocytic anemia 11/10/2017  . Aortic atherosclerosis ([place]) 11/10/2017  . Respiratory failure with hypoxia ([place]) 08/18/2017  . Volume overload 08/18/2017  . Dialysis patient, noncompliant ([place]) 08/17/2017  . Thumb pain, left 12/22/2016  . Chronic diastolic heart failure  ([place]) 11/25/2016  . ESRD (end stage renal disease) on dialysis ([place]) 06/25/2014  . Malignant hypertension 05/17/2014  . Chest pain 05/17/2014  . Elevated troponin 05/17/2014  . Hypertensive emergency 05/17/2014  . NSTEMI (non-ST elevated myocardial infarction) ([place]) 05/17/2014  . Chronic kidney disease, stage IV (severe) ([place]) 12/05/2013  . GERD 06/17/2009  . Hepatitis B carrier ([place]) 06/17/2009  . DEPRESSION 05/20/2009  . Unspecified glaucoma 05/20/2009  . HEADACHE 05/20/2009  . CARDIAC MURMUR 05/20/2009  . OSTEOPENIA 02/15/2008  . POSTMENOPAUSAL STATUS 12/21/2007  . Hyperlipidemia LDL goal <70 09/21/2007  . Essential hypertension 09/13/2007  . Type II diabetes mellitus with nephropathy [facility])     Past Surgical History:  Procedure Laterality Date  . A/V SHUNTOGRAM N/A 09/06/2018   Procedure: A/V SHUNTOGRAM - Left AV;  Surgeon: Marty Heck, MD;  Location: [facility] CV LAB;  Service: Cardiovascular;  Laterality: N/A;  . AV FISTULA PLACEMENT Left 05/29/2014   Procedure: INSERTION OF ARTERIOVENOUS (AV) GORE-TEX GRAFT ARM-LEFT;  Surgeon: Angelia Mould, MD;  Location: [facility];  Service: Vascular;  Laterality: Left;  . CHOLECYSTECTOMY  02/12/07  . INSERTION OF DIALYSIS CATHETER Right 05/27/2014   Procedure: INSERTION OF DIALYSIS CATHETER;  Surgeon: Angelia Mould, MD;  Location: [facility];  Service: Vascular;  Laterality: Right;  . PERIPHERAL VASCULAR BALLOON ANGIOPLASTY Left 09/06/2018   Procedure: PERIPHERAL VASCULAR BALLOON ANGIOPLASTY;  Surgeon: Carlis Abbott,  Gwenyth Allegra, MD;  Location: [facility];  Service: Cardiovascular;  Laterality: Left;  arm shunt  . REFRACTIVE SURGERY  07/29/09   Dr. Bing Plume  . TONSILLECTOMY       OB History   No obstetric history on file.      Home Medications    Prior to Admission medications   Medication Sig Start Date End Date Taking? Authorizing Provider  Amino Acids-Protein Hydrolys (FEEDING SUPPLEMENT, PRO-STAT SUGAR FREE 64,)  LIQD Take 30 mLs by mouth 2 (two) times daily. 10/18/18   Kayleen Memos, DO  aspirin (ASPIRIN CHILDRENS) 81 MG chewable tablet Chew 1 tablet (81 mg total) by mouth daily. 10/18/18 10/18/19  Kayleen Memos, DO  B Complex-C-Zn-Folic Acid (DIALYVITE/ZINC) TABS Take 1 tablet by mouth daily.    [provider]  Multiple Vitamin (MULTIVITAMIN WITH MINERALS) TABS tablet Take 1 tablet by mouth daily. 10/19/18   Kayleen Memos, DO  nitroGLYCERIN (NITROSTAT) 0.4 MG SL tablet Place 1 tablet (0.4 mg total) under the tongue every 5 (five) minutes as needed for chest pain. 4/0/98   Delora Fuel, MD  sevelamer carbonate (RENVELA) 800 MG tablet Take 2 tablets (1,600 mg total) by mouth 3 (three) times daily with meals. 10/18/18   Kayleen Memos, DO    Family History Family History  Problem Relation Age of Onset  . Kidney disease Mother   . Coronary artery disease Other   . Diabetes Other   . Hypertension Other   . Arthritis Other   . Kidney disease Sister     Social History Social History   Tobacco Use  . Smoking status: Never Smoker  . Smokeless tobacco: Never Used  . Tobacco comment: Married x's 70yrs, 6 kids-scattered [employer]. Retired Consulting civil engineer  Substance Use Topics  . Alcohol use: No  . Drug use: No     Allergies   Hydralazine and Robaxin [methocarbamol]   Review of Systems Review of Systems  Musculoskeletal:       L knee pain   All other systems reviewed and are negative.    Physical Exam Updated Vital Signs BP 125/73   Pulse 75   Temp 98.2 F (36.8 C) (Oral)   Resp 15   SpO2 100%   Physical Exam Vitals signs and nursing note reviewed.  HENT:     Head: Normocephalic.     Right Ear: Tympanic membrane normal.     Left Ear: Tympanic membrane normal.     Nose: Nose normal.     Comments: No facial tenderness  Eyes:     Extraocular Movements: Extraocular movements intact.     Pupils: Pupils are equal, round, and reactive to light.  Neck:      Musculoskeletal: Normal range of motion and neck supple.     Comments: No midline spinal tenderness  Cardiovascular:     Rate and Rhythm: Normal rate.     Pulses: Normal pulses.     Heart sounds: Normal heart sounds.  Pulmonary:     Effort: Pulmonary effort is normal.     Breath sounds: Normal breath sounds.  Abdominal:     General: Abdomen is flat.     Palpations: Abdomen is soft.  Musculoskeletal:     Comments: Effusion L knee, dec ROM from pain, R knee nl ROM. Nl ROM bilateral hips.   Skin:    General: Skin is warm.     Capillary Refill: Capillary refill takes less than 2 seconds.  Neurological:  General: No focal deficit present.     Mental Status: She is alert and oriented to person, place, and time.  Psychiatric:        Mood and Affect: Mood normal.        Behavior: Behavior normal.      ED Treatments / Results  Labs (all labs ordered are listed, but only abnormal results are displayed) Labs Reviewed - No data to display  EKG None  Radiology Dg Chest 2 View  Result Date: 12/08/2018 CLINICAL DATA:  Patient status post fall. EXAM: CHEST - 2 VIEW COMPARISON:  Chest radiograph 11/23/2018 FINDINGS: Monitoring leads overlie the patient. Stable cardiomegaly. Aortic atherosclerosis. Bibasilar atelectasis. No large area pulmonary consolidation. No pleural effusion or pneumothorax. Thoracic spine degenerative changes. Cholecystectomy clips. IMPRESSION: No acute cardiopulmonary process. Cardiomegaly. Electronically Signed   By: Lovey Newcomer M.D.   On: 12/08/2018 13:13   Dg Knee Complete 4 Views Left  Result Date: 12/08/2018 CLINICAL DATA:  Patient status post fall hitting the knee. Initial encounter. EXAM: LEFT KNEE - COMPLETE 4+ VIEW COMPARISON:  None. FINDINGS: Normal anatomic alignment. Chondrocalcinosis. Medial, lateral and patellofemoral compartment degenerative changes. No joint effusion. Extensive vascular calcifications. Transverse lucency demonstrated through the  patella. IMPRESSION: There is a transverse lucency demonstrated through the patella concerning for nondisplaced patellar fracture. Electronically Signed   By: Lovey Newcomer M.D.   On: 12/08/2018 13:12   Dg Knee Complete 4 Views Right  Result Date: 12/08/2018 CLINICAL DATA:  79 year old female with bilateral knee pain after tripping and falling last night. EXAM: RIGHT KNEE - COMPLETE 4+ VIEW COMPARISON:  Concurrently obtained radiographs of the contralateral knee FINDINGS: No evidence of acute fracture or malalignment. No knee joint effusion. Normal bony mineralization without lytic or blastic osseous lesion. Chondrocalcinosis is present in the medial and lateral compartments. Mild enthesopathy at the superior pole of the patella. Extensive atherosclerotic vascular calcifications throughout the visualized arterial tree. IMPRESSION: 1. No evidence of acute fracture, malalignment or knee joint effusion. 2. Chondrocalcinosis. 3. Extensive atherosclerotic vascular calcifications. Electronically Signed   By: Jacqulynn Cadet M.D.   On: 12/08/2018 13:13    Procedures Procedures (including critical care time)  Medications Ordered in ED Medications  traMADol (ULTRAM) tablet 50 mg (50 mg Oral Given 12/08/18 1117)     Initial Impression / Assessment and Plan / ED Course  I have reviewed the triage vital signs and the nursing notes.  Pertinent labs & imaging results that were available during my care of the patient were reviewed by me and considered in my medical decision making (see chart for details).    ACHOL AZPEITIA is a 78 y.o. female here with fall with L knee pain. Hit her face but no signs of facial bone tenderness, nl neuro exam and injury was yesterday so will not need CT head. Will get xrays.   2:04 PM Xray showed possible L patellar fracture. Patient is unsteady at baseline so will need wheelchair. Talked to case management to get a wheelchair delivered to the ED prior to discharge. Will refer  to ortho outpatient.    Final Clinical Impressions(s) / ED Diagnoses   Final diagnoses:  None    ED Discharge Orders         Ordered    For home use only DME high strength lightweight manual wheelchair with seat cushion    Comments:  Patient suffers from patellar fracture which impairs their ability to perform daily activities like bathing in the home.  A crutch or walker will not resolve  issue with performing activities of daily living. A wheelchair will allow patient to safely perform daily activities.  (THEN ONE OF THESE TWO:) Patient self-propels the wheelchair while engaging in frequent activities such as laundry which cannot be performed in a standard or lightweight wheelchair due to the weight of the chair. Accessories: elevating leg rests (ELRs), wheel locks, extensions and anti-tippers.   12/08/18 1355           Drenda Freeze, MD 12/08/18 [phone]

## 2018-12-08 NOTE — Care Management (Cosign Needed Addendum)
    Durable Medical Equipment  (From admission, onward)         Start     Ordered   12/08/18 0000  For home use only DME high strength lightweight manual wheelchair with seat cushion    Comments:  Patient suffers from patellar fracture which impairs their ability to perform daily activities like bathing in the home.  A crutch or walker will not resolve  issue with performing activities of daily living. A wheelchair will allow patient to safely perform daily activities.  (THEN ONE OF THESE TWO:) Patient self-propels the wheelchair while engaging in frequent activities such as laundry which cannot be performed in a standard or lightweight wheelchair due to the weight of the chair. Accessories: elevating leg rests (ELRs), wheel locks, extensions and anti-tippers.   12/08/18 1355

## 2018-12-08 NOTE — ED Triage Notes (Signed)
Patient had trip and fall last night in home. No loc. Complains of bilateral elbow and bilateral knee pain, NAD

## 2018-12-08 NOTE — ED Notes (Signed)
Patient verbalizes understanding of discharge instructions. Opportunity for questioning and answers were provided. Armband removed by staff, pt discharged from ED via wheelchair to home.  

## 2018-12-08 NOTE — Discharge Instructions (Addendum)
Take tylenol for pain   Take vicodin for severe pain.   You have a broken knee cap. Use knee immobilizer and wheelchair   See ortho for follow up   Return to ER if you have worse knee pain or swelling, trouble walking

## 2018-12-08 NOTE — ED Provider Notes (Signed)
Note Revision History    Edited by Carles Collet, RN, 12/08/2018 1:56 PM     Carles Collet, RN  Case Manager  CASE MANAGEMENT  Care Management  Cosign Needed Addendum  Date of Service:  12/08/2018 1:56 PM                     Durable Medical Equipment  (From admission, onward)                   Start     Ordered    12/08/18 0000  For home use only DME high strength lightweight manual wheelchair with seat cushion    Comments:  Patient suffers from patellar fracture which impairs their ability to perform daily activities like bathing in the home.  A crutch or walker will not resolve  issue with performing activities of daily living. A wheelchair will allow patient to safely perform daily activities.  (THEN ONE OF THESE TWO:) Patient self-propels the wheelchair while engaging in frequent activities such as laundry which cannot be performed in a standard or lightweight wheelchair due to the weight of the chair. Accessories: elevating leg rests (ELRs), wheel locks, extensions and anti-tippers.   12/08/18 1355             Edited by Carles Collet, RN, 12/08/2018 1:56 PM     Carles Collet, RN  Case Manager  CASE MANAGEMENT  Care Management  Signed  Date of Service:  12/08/2018 1:56 PM                Show:Clear all [] Manual[x] Template[] Copied  Added by: [x] Swist, Debbie, RN  [] Hover for details       [employer]  (From admission, onward)                   Start     Ordered    12/08/18 0000  For home use only DME high strength lightweight manual wheelchair with seat cushion    Comments:  Patient suffers from patellar fracture which impairs their ability to perform daily activities like bathing in the home.  A crutch or walker will not resolve  issue with performing activities of daily living. A wheelchair will allow patient to safely perform daily activities.  (THEN ONE OF THESE TWO:) Patient self-propels the wheelchair  while engaging in frequent activities such as laundry which cannot be performed in a standard or lightweight wheelchair due to the weight of the chair. Accessories: elevating leg rests (ELRs), wheel locks, extensions and anti-tippers.   12/08/18 1355              Note Revision History    Edited by Carles Collet, RN, 12/08/2018 1:56 PM     Swist, Jackelyn Poling, RN  Case Manager  CASE MANAGEMENT  Care Management  Cosign Needed Addendum  Date of Service:  12/08/2018 1:56 PM                     Durable Medical Equipment  (From admission, onward)                   Start     Ordered    12/08/18 0000  For home use only DME high strength lightweight manual wheelchair with seat cushion    Comments:  Patient suffers from patellar fracture which impairs their ability to perform daily activities like bathing in the home.  A crutch or walker will not resolve  issue with performing activities  of daily living. A wheelchair will allow patient to safely perform daily activities.  (THEN ONE OF THESE TWO:) Patient self-propels the wheelchair while engaging in frequent activities such as laundry which cannot be performed in a standard or lightweight wheelchair due to the weight of the chair. Accessories: elevating leg rests (ELRs), wheel locks, extensions and anti-tippers.   12/08/18 1355             Edited by Carles Collet, RN, 12/08/2018 1:56 PM     Carles Collet, RN  Case Manager  CASE MANAGEMENT  Care Management  Signed  Date of Service:  12/08/2018 1:56 PM                Show:Clear all [] Manual[x] Template[] Copied  Added by: [x] Swist, Debbie, RN  [] Hover for details       [employer]  (From admission, onward)                   Start     Ordered    12/08/18 0000  For home use only DME high strength lightweight manual wheelchair with seat cushion    Comments:  Patient suffers from patellar fracture which impairs their ability  to perform daily activities like bathing in the home.  A crutch or walker will not resolve  issue with performing activities of daily living. A wheelchair will allow patient to safely perform daily activities.  (THEN ONE OF THESE TWO:) Patient self-propels the wheelchair while engaging in frequent activities such as laundry which cannot be performed in a standard or lightweight wheelchair due to the weight of the chair. Accessories: elevating leg rests (ELRs), wheel locks, extensions and anti-tippers.   12/08/18 1355                 Drenda Freeze, MD 12/08/18 [phone]

## 2018-12-08 NOTE — Care Management (Signed)
Requested wheelchair to be delivered to room prior to DC.

## 2018-12-10 DIAGNOSIS — N2581 Secondary hyperparathyroidism of renal origin: Secondary | ICD-10-CM | POA: Diagnosis not present

## 2018-12-10 DIAGNOSIS — N186 End stage renal disease: Secondary | ICD-10-CM | POA: Diagnosis not present

## 2018-12-13 ENCOUNTER — Inpatient Hospital Stay (HOSPITAL_COMMUNITY)
Admission: EM | Admit: 2018-12-13 | Discharge: 2018-12-15 | DRG: 314 | Disposition: A | Discharge status: Home / Self Care | Payer: Medicare HMO | Attending: Internal Medicine | Admitting: Internal Medicine

## 2018-12-13 ENCOUNTER — Ambulatory Visit: Payer: Medicare HMO | Admitting: Physician Assistant

## 2018-12-13 ENCOUNTER — Other Ambulatory Visit: Payer: Self-pay

## 2018-12-13 ENCOUNTER — Encounter (HOSPITAL_COMMUNITY): Payer: Self-pay

## 2018-12-13 ENCOUNTER — Emergency Department (HOSPITAL_COMMUNITY): Payer: Medicare HMO

## 2018-12-13 DIAGNOSIS — E8889 Other specified metabolic disorders: Secondary | ICD-10-CM | POA: Diagnosis present

## 2018-12-13 DIAGNOSIS — T82898A Other specified complication of vascular prosthetic devices, implants and grafts, initial encounter: Secondary | ICD-10-CM | POA: Diagnosis not present

## 2018-12-13 DIAGNOSIS — Z8249 Family history of ischemic heart disease and other diseases of the circulatory system: Secondary | ICD-10-CM | POA: Diagnosis not present

## 2018-12-13 DIAGNOSIS — T82318A Breakdown (mechanical) of other vascular grafts, initial encounter: Secondary | ICD-10-CM | POA: Diagnosis present

## 2018-12-13 DIAGNOSIS — H409 Unspecified glaucoma: Secondary | ICD-10-CM | POA: Diagnosis present

## 2018-12-13 DIAGNOSIS — F329 Major depressive disorder, single episode, unspecified: Secondary | ICD-10-CM | POA: Diagnosis present

## 2018-12-13 DIAGNOSIS — F0390 Unspecified dementia without behavioral disturbance: Secondary | ICD-10-CM | POA: Diagnosis present

## 2018-12-13 DIAGNOSIS — Z79891 Long term (current) use of opiate analgesic: Secondary | ICD-10-CM | POA: Diagnosis not present

## 2018-12-13 DIAGNOSIS — R58 Hemorrhage, not elsewhere classified: Secondary | ICD-10-CM | POA: Diagnosis not present

## 2018-12-13 DIAGNOSIS — Z9119 Patient's noncompliance with other medical treatment and regimen: Secondary | ICD-10-CM

## 2018-12-13 DIAGNOSIS — Z992 Dependence on renal dialysis: Secondary | ICD-10-CM

## 2018-12-13 DIAGNOSIS — R0602 Shortness of breath: Secondary | ICD-10-CM | POA: Diagnosis not present

## 2018-12-13 DIAGNOSIS — K219 Gastro-esophageal reflux disease without esophagitis: Secondary | ICD-10-CM | POA: Diagnosis present

## 2018-12-13 DIAGNOSIS — M8580 Other specified disorders of bone density and structure, unspecified site: Secondary | ICD-10-CM | POA: Diagnosis present

## 2018-12-13 DIAGNOSIS — R1110 Vomiting, unspecified: Secondary | ICD-10-CM | POA: Diagnosis not present

## 2018-12-13 DIAGNOSIS — J9601 Acute respiratory failure with hypoxia: Secondary | ICD-10-CM

## 2018-12-13 DIAGNOSIS — E785 Hyperlipidemia, unspecified: Secondary | ICD-10-CM | POA: Diagnosis present

## 2018-12-13 DIAGNOSIS — D649 Anemia, unspecified: Secondary | ICD-10-CM

## 2018-12-13 DIAGNOSIS — D62 Acute posthemorrhagic anemia: Secondary | ICD-10-CM | POA: Diagnosis not present

## 2018-12-13 DIAGNOSIS — E1121 Type 2 diabetes mellitus with diabetic nephropathy: Secondary | ICD-10-CM | POA: Diagnosis present

## 2018-12-13 DIAGNOSIS — I120 Hypertensive chronic kidney disease with stage 5 chronic kidney disease or end stage renal disease: Secondary | ICD-10-CM | POA: Diagnosis not present

## 2018-12-13 DIAGNOSIS — Z79899 Other long term (current) drug therapy: Secondary | ICD-10-CM | POA: Diagnosis not present

## 2018-12-13 DIAGNOSIS — T82590A Other mechanical complication of surgically created arteriovenous fistula, initial encounter: Secondary | ICD-10-CM | POA: Diagnosis present

## 2018-12-13 DIAGNOSIS — R110 Nausea: Secondary | ICD-10-CM | POA: Diagnosis not present

## 2018-12-13 DIAGNOSIS — I5032 Chronic diastolic (congestive) heart failure: Secondary | ICD-10-CM | POA: Diagnosis present

## 2018-12-13 DIAGNOSIS — D631 Anemia in chronic kidney disease: Secondary | ICD-10-CM | POA: Diagnosis present

## 2018-12-13 DIAGNOSIS — T8249XA Other complication of vascular dialysis catheter, initial encounter: Secondary | ICD-10-CM | POA: Diagnosis not present

## 2018-12-13 DIAGNOSIS — Z888 Allergy status to other drugs, medicaments and biological substances status: Secondary | ICD-10-CM | POA: Diagnosis not present

## 2018-12-13 DIAGNOSIS — Z841 Family history of disorders of kidney and ureter: Secondary | ICD-10-CM | POA: Diagnosis not present

## 2018-12-13 DIAGNOSIS — Y712 Prosthetic and other implants, materials and accessory cardiovascular devices associated with adverse incidents: Secondary | ICD-10-CM | POA: Diagnosis present

## 2018-12-13 DIAGNOSIS — N186 End stage renal disease: Secondary | ICD-10-CM | POA: Diagnosis present

## 2018-12-13 DIAGNOSIS — E1122 Type 2 diabetes mellitus with diabetic chronic kidney disease: Secondary | ICD-10-CM | POA: Diagnosis present

## 2018-12-13 DIAGNOSIS — R413 Other amnesia: Secondary | ICD-10-CM | POA: Diagnosis present

## 2018-12-13 DIAGNOSIS — R1111 Vomiting without nausea: Secondary | ICD-10-CM | POA: Diagnosis not present

## 2018-12-13 DIAGNOSIS — R112 Nausea with vomiting, unspecified: Secondary | ICD-10-CM | POA: Diagnosis not present

## 2018-12-13 DIAGNOSIS — D638 Anemia in other chronic diseases classified elsewhere: Secondary | ICD-10-CM | POA: Diagnosis present

## 2018-12-13 DIAGNOSIS — I132 Hypertensive heart and chronic kidney disease with heart failure and with stage 5 chronic kidney disease, or end stage renal disease: Secondary | ICD-10-CM | POA: Diagnosis present

## 2018-12-13 DIAGNOSIS — I959 Hypotension, unspecified: Secondary | ICD-10-CM | POA: Diagnosis not present

## 2018-12-13 DIAGNOSIS — N2581 Secondary hyperparathyroidism of renal origin: Secondary | ICD-10-CM | POA: Diagnosis not present

## 2018-12-13 DIAGNOSIS — I10 Essential (primary) hypertension: Secondary | ICD-10-CM | POA: Diagnosis not present

## 2018-12-13 HISTORY — DX: Dependence on renal dialysis: N18.6

## 2018-12-13 HISTORY — DX: Dependence on renal dialysis: Z99.2

## 2018-12-13 LAB — CBC
HCT: 25.4 % — ABNORMAL LOW (ref 36.0–46.0)
HEMATOCRIT: 26.4 % — AB (ref 36.0–46.0)
Hemoglobin: 8.3 g/dL — ABNORMAL LOW (ref 12.0–15.0)
Hemoglobin: 7.9 g/dL — ABNORMAL LOW (ref 12.0–15.0)
MCH: 30.9 pg (ref 26.0–34.0)
MCH: 30.7 pg (ref 26.0–34.0)
MCHC: 31.4 g/dL (ref 30.0–36.0)
MCHC: 31.1 g/dL (ref 30.0–36.0)
MCV: 98.1 fL (ref 80.0–100.0)
MCV: 98.8 fL (ref 80.0–100.0)
Platelets: 71 10*3/uL — ABNORMAL LOW (ref 150–400)
Platelets: 70 10*3/uL — ABNORMAL LOW (ref 150–400)
RBC: 2.69 MIL/uL — ABNORMAL LOW (ref 3.87–5.11)
RBC: 2.57 MIL/uL — ABNORMAL LOW (ref 3.87–5.11)
RDW: 19 % — AB (ref 11.5–15.5)
RDW: 19 % — ABNORMAL HIGH (ref 11.5–15.5)
WBC: 2.7 10*3/uL — AB (ref 4.0–10.5)
WBC: 2.4 10*3/uL — ABNORMAL LOW (ref 4.0–10.5)
nRBC: 0 % (ref 0.0–0.2)
nRBC: 0 % (ref 0.0–0.2)

## 2018-12-13 LAB — CBC WITH DIFFERENTIAL/PLATELET
ABS IMMATURE GRANULOCYTES: 0.01 10*3/uL (ref 0.00–0.07)
Basophils Absolute: 0 10*3/uL (ref 0.0–0.1)
Basophils Relative: 1 %
Eosinophils Absolute: 0.1 10*3/uL (ref 0.0–0.5)
Eosinophils Relative: 4 %
HCT: 22.3 % — ABNORMAL LOW (ref 36.0–46.0)
Hemoglobin: 6.6 g/dL — CL (ref 12.0–15.0)
Immature Granulocytes: 1 %
Lymphocytes Relative: 18 %
Lymphs Abs: 0.4 10*3/uL — ABNORMAL LOW (ref 0.7–4.0)
MCH: 30.8 pg (ref 26.0–34.0)
MCHC: 29.6 g/dL — ABNORMAL LOW (ref 30.0–36.0)
MCV: 104.2 fL — ABNORMAL HIGH (ref 80.0–100.0)
Monocytes Absolute: 0.2 10*3/uL (ref 0.1–1.0)
Monocytes Relative: 9 %
NEUTROS ABS: 1.3 10*3/uL — AB (ref 1.7–7.7)
Neutrophils Relative %: 67 %
Platelets: 66 10*3/uL — ABNORMAL LOW (ref 150–400)
RBC: 2.14 MIL/uL — ABNORMAL LOW (ref 3.87–5.11)
RDW: 18.1 % — ABNORMAL HIGH (ref 11.5–15.5)
WBC: 2 10*3/uL — ABNORMAL LOW (ref 4.0–10.5)
nRBC: 0 % (ref 0.0–0.2)

## 2018-12-13 LAB — BASIC METABOLIC PANEL
Anion gap: 14 (ref 5–15)
BUN: 68 mg/dL — ABNORMAL HIGH (ref 8–23)
CO2: 25 mmol/L (ref 22–32)
Calcium: 8.7 mg/dL — ABNORMAL LOW (ref 8.9–10.3)
Chloride: 101 mmol/L (ref 98–111)
Creatinine, Ser: 7.43 mg/dL — ABNORMAL HIGH (ref 0.44–1.00)
GFR calc Af Amer: 6 mL/min — ABNORMAL LOW (ref >60)
GFR calc non Af Amer: 5 mL/min — ABNORMAL LOW (ref >60)
Glucose, Bld: 112 mg/dL — ABNORMAL HIGH (ref 70–99)
Potassium: 4 mmol/L (ref 3.5–5.1)
Sodium: 140 mmol/L (ref 135–145)

## 2018-12-13 LAB — I-STAT TROPONIN, ED: Troponin i, poc: 0.04 ng/mL (ref 0.00–0.08)

## 2018-12-13 LAB — RENAL FUNCTION PANEL
Albumin: 2.7 g/dL — ABNORMAL LOW (ref 3.5–5.0)
Anion gap: 10 (ref 5–15)
BUN: 70 mg/dL — ABNORMAL HIGH (ref 8–23)
CO2: 30 mmol/L (ref 22–32)
CREATININE: 7.71 mg/dL — AB (ref 0.44–1.00)
Calcium: 8.6 mg/dL — ABNORMAL LOW (ref 8.9–10.3)
Chloride: 102 mmol/L (ref 98–111)
GFR calc Af Amer: 5 mL/min — ABNORMAL LOW (ref >60)
GFR calc non Af Amer: 5 mL/min — ABNORMAL LOW (ref >60)
Glucose, Bld: 87 mg/dL (ref 70–99)
Phosphorus: 6 mg/dL — ABNORMAL HIGH (ref 2.5–4.6)
Potassium: 3.8 mmol/L (ref 3.5–5.1)
Sodium: 142 mmol/L (ref 135–145)

## 2018-12-13 LAB — PREPARE RBC (CROSSMATCH)

## 2018-12-13 MED ORDER — METOPROLOL TARTRATE 5 MG/5ML IV SOLN
INTRAVENOUS | Status: AC
  Filled 2018-12-13: qty 10

## 2018-12-13 MED ORDER — ZOLPIDEM TARTRATE 5 MG PO TABS: 5.0000 mg | ORAL_TABLET | Freq: Every evening | ORAL | Status: DC | PRN

## 2018-12-13 MED ORDER — ACETAMINOPHEN 325 MG PO TABS: 650.0000 mg | ORAL_TABLET | Freq: Four times a day (QID) | ORAL | Status: DC | PRN

## 2018-12-13 MED ORDER — CAMPHOR-MENTHOL 0.5-0.5 % EX LOTN
1.0000 "application " | TOPICAL_LOTION | Freq: Three times a day (TID) | CUTANEOUS | Status: DC | PRN
  Filled 2018-12-13: qty 222

## 2018-12-13 MED ORDER — DOCUSATE SODIUM 100 MG PO CAPS
100.0000 mg | ORAL_CAPSULE | Freq: Two times a day (BID) | ORAL | Status: DC
  Administered 2018-12-15: 100 mg via ORAL
  Filled 2018-12-13 (×4): qty 1

## 2018-12-13 MED ORDER — ONDANSETRON HCL 4 MG PO TABS: 4.0000 mg | ORAL_TABLET | Freq: Four times a day (QID) | ORAL | Status: DC | PRN

## 2018-12-13 MED ORDER — METOPROLOL TARTRATE 5 MG/5ML IV SOLN: 10.0000 mg | Freq: Once | INTRAVENOUS | Status: DC

## 2018-12-13 MED ORDER — SORBITOL 70 % SOLN: 30.0000 mL | Status: DC | PRN

## 2018-12-13 MED ORDER — SODIUM CHLORIDE 0.9% FLUSH
3.0000 mL | Freq: Two times a day (BID) | INTRAVENOUS | Status: DC
  Administered 2018-12-13 – 2018-12-14 (×3): 3 mL via INTRAVENOUS

## 2018-12-13 MED ORDER — HYDROXYZINE HCL 25 MG PO TABS: 25.0000 mg | ORAL_TABLET | Freq: Three times a day (TID) | ORAL | Status: DC | PRN

## 2018-12-13 MED ORDER — SEVELAMER CARBONATE 800 MG PO TABS
1600.0000 mg | ORAL_TABLET | Freq: Three times a day (TID) | ORAL | Status: DC
  Administered 2018-12-13 – 2018-12-14 (×2): 1600 mg via ORAL
  Filled 2018-12-13 (×3): qty 2

## 2018-12-13 MED ORDER — DOCUSATE SODIUM 283 MG RE ENEM
1.0000 | ENEMA | RECTAL | Status: DC | PRN
  Filled 2018-12-13: qty 1

## 2018-12-13 MED ORDER — ONDANSETRON HCL 4 MG/2ML IJ SOLN: 4.0000 mg | Freq: Four times a day (QID) | INTRAMUSCULAR | Status: DC | PRN

## 2018-12-13 MED ORDER — NEPRO/CARBSTEADY PO LIQD
237.0000 mL | Freq: Three times a day (TID) | ORAL | Status: DC | PRN
  Filled 2018-12-13: qty 237

## 2018-12-13 MED ORDER — CALCIUM CARBONATE ANTACID 1250 MG/5ML PO SUSP
500.0000 mg | Freq: Four times a day (QID) | ORAL | Status: DC | PRN
  Filled 2018-12-13: qty 5

## 2018-12-13 MED ORDER — HYDROCODONE-ACETAMINOPHEN 5-325 MG PO TABS
1.0000 | ORAL_TABLET | Freq: Four times a day (QID) | ORAL | Status: DC | PRN
  Filled 2018-12-13: qty 1

## 2018-12-13 MED ORDER — SODIUM CHLORIDE 0.9% IV SOLUTION: Freq: Once | INTRAVENOUS | Status: DC

## 2018-12-13 MED ORDER — ACETAMINOPHEN 650 MG RE SUPP: 650.0000 mg | Freq: Four times a day (QID) | RECTAL | Status: DC | PRN

## 2018-12-13 NOTE — ED Notes (Signed)
Informed consent signed for blood transfusion. Witnessed by this Therapist, sports.

## 2018-12-13 NOTE — ED Triage Notes (Signed)
Patient BIB [facility] for SOB and nausea this morning. Reports she was unable to received full dialysis treatment yesterday because "my fistula wouldn't stop hemorrhaging". Denies SOB at this time. States "it was just when I woke up and it scared me". Patient denies chest pain, abdominal pain, fever, or dysuria.  Patients only complaint at this time is knee pain.

## 2018-12-13 NOTE — Progress Notes (Signed)
IR aware of request for fistulagram - tentatively planned for 2/14 in IR. Orders placed for NPO after midnight, AM labs. PA will see for consult/consent in AM.  Please call IR with questions or concerns.  Candiss Norse, PA-C

## 2018-12-13 NOTE — H&P (Signed)
History and Physical    [name] [name]:[id number] DOB: 1940/12/27 DOA: 12/13/2018  PCP: Ann Held, DO Consultants:  Nephrology; has upcoming initial appointment with [facility] cardiology Patient coming from:  Home - lives with husband; NOK: Daughters, [phone]; [phone]  Chief Complaint: SOB  HPI: [name] [name] is a 78 y.o. female with medical history significant of ESRD on MWF HD; HTN; HLD; and DM presenting with SOB.  She fell face-down on her knees and fractured her knees on the wooden floor prior.  Last night, she had SOB.  She was asleep and awoke in the middle of the night with acute SOB.  She was not coughing.  She didn't feel that well yesterday - she went to HD and her fistula starting hemorrhaging.  They wanted to bring her to the hospital yesterday but it settled and EMS suggested that she didn't need to go.  The SOB resolved spontaneously.  She did vomit once.  Now she feels good - and has not yet received the blood.   ED Course:  Carryover, per Dr. Hal Hope:  78 year old female with history of ESRD was found to be short of breath at the dialysis center also had mild bleeding from the fistula site. In the ER chest x-ray was unremarkable bleeding fistula had stopped any further bleed. Hemoglobin has dropped from baseline by 2 g. ER physician at this time feels patient shortness of breath could be from the anemia. 1 unit of PRBC transfusion ordered.  Review of Systems: As per HPI; otherwise review of systems reviewed and negative.   Ambulatory Status:  Ambulates without assistance  Past Medical History:  Diagnosis Date  . DEPRESSION   . DIABETES MELLITUS, TYPE I   . ESRD (end stage renal disease) on dialysis ([place])    MWF HD  . GERD   . GLAUCOMA   . Headache(784.0)   . Hepatitis B carrier ([place])    05/2009: neg Hep C; Hep B: core pos, Surf neg; fatty liver US 8/10 - 7/13  . HYPERLIPIDEMIA   . HYPERTENSION   . OSTEOPENIA   . POSTMENOPAUSAL STATUS    . Pulmonary edema   . Unspecified vitamin D deficiency     Past Surgical History:  Procedure Laterality Date  . A/V SHUNTOGRAM N/A 09/06/2018   Procedure: A/V SHUNTOGRAM - Left AV;  Surgeon: Marty Heck, MD;  Location: [place] [facility];  Service: Cardiovascular;  Laterality: N/A;  . AV FISTULA PLACEMENT Left 05/29/2014   Procedure: INSERTION OF ARTERIOVENOUS (AV) GORE-TEX GRAFT ARM-LEFT;  Surgeon: Angelia Mould, MD;  Location: [place];  Service: Vascular;  Laterality: Left;  . CHOLECYSTECTOMY  02/12/07  . INSERTION OF DIALYSIS CATHETER Right 05/27/2014   Procedure: INSERTION OF DIALYSIS CATHETER;  Surgeon: Angelia Mould, MD;  Location: [place];  Service: Vascular;  Laterality: Right;  . PERIPHERAL VASCULAR BALLOON ANGIOPLASTY Left 09/06/2018   Procedure: PERIPHERAL VASCULAR BALLOON ANGIOPLASTY;  Surgeon: Marty Heck, MD;  Location: [place] [facility];  Service: Cardiovascular;  Laterality: Left;  arm shunt  . REFRACTIVE SURGERY  07/29/09   Dr. Bing Plume  . TONSILLECTOMY      Social History   Socioeconomic History  . Marital status: Married    Spouse name: Not on file  . Number of children: Not on file  . Years of education: Not on file  . Highest education level: Not on file  Occupational History  . Not on file  Social Needs  . Financial  resource strain: Not on file  . Food insecurity:    Worry: Not on file    Inability: Not on file  . Transportation needs:    Medical: Not on file    Non-medical: Not on file  Tobacco Use  . Smoking status: Never Smoker  . Smokeless tobacco: Never Used  . Tobacco comment: Married x's 101yrs, 6 kids-scattered [employer]. Retired Consulting civil engineer  Substance and Sexual Activity  . Alcohol use: No  . Drug use: No  . Sexual activity: Not Currently    Birth control/protection: None  Lifestyle  . Physical activity:    Days per week: Not on file    Minutes per session: Not on file  . Stress: Not on file  Relationships  .  Social connections:    Talks on phone: Not on file    Gets together: Not on file    Attends religious service: Not on file    Active member of club or organization: Not on file    Attends meetings of clubs or organizations: Not on file    Relationship status: Not on file  . Intimate partner violence:    Fear of current or ex partner: Not on file    Emotionally abused: Not on file    Physically abused: Not on file    Forced sexual activity: Not on file  Other Topics Concern  . Not on file  Social History Narrative  . Not on file    Allergies  Allergen Reactions  . Hydralazine Itching  . Robaxin [Methocarbamol] Other (See Comments)    Dizziness     Family History  Problem Relation Age of Onset  . Kidney disease Mother   . Coronary artery disease Other   . Diabetes Other   . Hypertension Other   . Arthritis Other   . Kidney disease Sister     Prior to Admission medications   Medication Sig Start Date End Date Taking? Authorizing Provider  HYDROcodone-acetaminophen (NORCO/VICODIN) 5-325 MG tablet Take 1 tablet by mouth every 6 (six) hours as needed. Patient taking differently: Take 1 tablet by mouth every 6 (six) hours as needed for moderate pain.  12/08/18  Yes Drenda Freeze, MD  Multiple Vitamin (MULTIVITAMIN WITH MINERALS) TABS tablet Take 1 tablet by mouth daily. 10/19/18  Yes Kayleen Memos, DO  nitroGLYCERIN (NITROSTAT) 0.4 MG SL tablet Place 1 tablet (0.4 mg total) under the tongue every 5 (five) minutes as needed for chest pain. 03/04/72  Yes Delora Fuel, MD  sevelamer carbonate (RENVELA) 800 MG tablet Take 2 tablets (1,600 mg total) by mouth 3 (three) times daily with meals. 10/18/18  Yes Hall, Lorenda Cahill, DO  Amino Acids-Protein Hydrolys (FEEDING SUPPLEMENT, PRO-STAT SUGAR FREE 64,) LIQD Take 30 mLs by mouth 2 (two) times daily. Patient not taking: Reported on 12/13/2018 10/18/18   Kayleen Memos, DO  aspirin (ASPIRIN CHILDRENS) 81 MG chewable tablet Chew 1 tablet (81  mg total) by mouth daily. Patient not taking: Reported on 12/13/2018 10/18/18 10/18/19  Kayleen Memos, DO    Physical Exam: Vitals:   12/13/18 1655 12/13/18 1701 12/13/18 1705 12/13/18 1715  BP: 123/69 131/72 137/74 127/84  Pulse: 80 80 80 (!) 126  Resp: 15  19   Temp: 98.2 F (36.8 C)     TempSrc: Oral     SpO2: 97%     Weight: 58.6 kg        . General:  Appears calm and comfortable and is NAD .  Eyes:   EOMI, normal lids, iris . ENT:  grossly normal hearing, lips & tongue, mmm . Neck:  no LAD, masses or thyromegaly . Cardiovascular:  RRR, no m/r/g. No LE edema.  Marland Kitchen Respiratory:   CTA bilaterally with no wheezes/rales/rhonchi.  Normal respiratory effort. . Abdomen:  soft, NT, ND, NABS . Back:   normal alignment, no CVAT . Skin:  no rash or induration seen on limited exam . Musculoskeletal:  grossly normal tone BUE/BLE, good ROM, no bony abnormality; fistula bandage on left arm is C/D/I . Psychiatric:  grossly normal mood and affect, speech fluent and appropriate, AOx2 with evidence of dementia - asking inappropriate questions, not clearly following conversation . Neurologic:  CN 2-12 grossly intact, moves all extremities in coordinated fashion, sensation intact    Radiological Exams on Admission: Dg Chest 2 View  Result Date: 12/13/2018 CLINICAL DATA:  Shortness of breath and vomiting EXAM: CHEST - 2 VIEW COMPARISON:  12/08/2018 FINDINGS: Chronic cardiomegaly. Vascular stent in the left arm. There is no edema, consolidation, effusion, or pneumothorax. IMPRESSION: Stable from prior.  No evidence of acute disease. Cardiomegaly. Electronically Signed   By: Monte Fantasia M.D.   On: 12/13/2018 04:39    EKG: Independently reviewed.  NSR with rate 70; LVH; nonspecific ST changes with no evidence of acute ischemia; NSCSLT   Labs on Admission: I have personally reviewed the available labs and imaging studies at the time of the admission.  Pertinent labs:   Glucose 112 BUN  68/Creatinine 7.43/GFR 6 Troponin 0.04 WBC 2.0; 3.1 on 1/24 Hgb 6.6; 7.9 on 1/24, which appears to be at/near baseline Platelets 66; 69 on 1/24   Assessment/Plan Principal Problem:   Acute respiratory failure with hypoxia (HCC) Active Problems:   Type II diabetes mellitus with nephropathy (HCC)   Essential hypertension   ESRD (end stage renal disease) on dialysis (HCC)   Chronic diastolic heart failure (HCC)   Anemia, chronic disease   Memory loss   AV graft malfunction (HCC)   Acute respiratory failure with hypoxia -Patient with acute onset of SOB while sleeping -She did not receive most of her HD session yesterday and so volume overload is a consideration, but her symptoms resolved spontaneously  -Symptomatic anemia - likely acute from bleeding from graft yesterday in conjunction with anemia of chronic renal disease - is another consideration, but again she resolved symptoms spontaneously prior to transfusion -Will observe overnight for now  ESRD on HD -Patient on chronic MWF HD -Nephrology prn order set utilized -She does not appear to be volume overloaded or otherwise in need of acute HD -She did not complete her HD session yesterday due to graft malfunction -Given that she is receiving PRBC and thus volume today, it may be reasonable to dialyze today or tomorrow AM prior to d/c  AV graft malfunction -IR consult in AM for shuntogram, as per Dr. Jonnie Finner -Reports significant blood loss at HD yesterday  Anemia -Baseline Hgb about 8-9 -She had significant blood loss yesterday and Hgb today is 6.6 -Generally symptomatic anemia starts with SOB with exertion, which was not the case here -ER ordered 1 unit PRBC transfused -Will recheck CBC and transfuse additional unit(s) as needed  DM -Diet controlled -Minimally elevated glucose on admission -Will monitor with fasting glucose for now without SSI  HTN -She is not currently taking medication for this issue  Chronic  diastolic CHF -6/31 echo with grade 2 diastolic dysfunction -4/97 echo with preserved systolic function but no comment about  diastolic function -Appears to be compensated at this time but could have had flash pulmonary edema as the cause of her symptoms  Memory loss -Patient attempted to follow conversation but clearly demonstrated evidence of dementia -Her daughter reports that this is baseline for the patient   DVT prophylaxis:  SCDs Code Status:   Full - confirmed with patient/family Family Communication: Daughter present throughout evaluation Disposition Plan:  Home once clinically improved Consults called: Nephrology; IR  Admission status: It is my clinical opinion that referral for OBSERVATION is reasonable and necessary in this patient based on the above information provided. The aforementioned taken together are felt to place the patient at high risk for further clinical deterioration. However it is anticipated that the patient may be medically stable for discharge from the hospital within 24 to 48 hours.    Karmen Bongo MD [facility]   How to contact the [facility] Attending or Consulting provider [place] or covering provider during after hours [place], for this patient?  1. Check the care team in [facility] and look for a) attending/consulting TRH provider listed and b) the [facility] team listed 2. Log into [email or web address] and use [place]'s universal password to access. If you do not have the password, please contact the hospital operator. 3. Locate the [facility] provider you are looking for under [facility] and page to a number that you can be directly reached. 4. If you still have difficulty reaching the provider, please page the [facility] (Director on Call) for the Hospitalists listed on amion for assistance.   12/13/2018, 5:26 PM

## 2018-12-13 NOTE — ED Notes (Signed)
Fistula access assessed and dressing replaced. Site is clean, intact, and dry. No bleeding noted at this time.

## 2018-12-13 NOTE — ED Provider Notes (Signed)
[place] EMERGENCY DEPARTMENT Provider Note   CSN: [id number] Arrival date & time: 12/13/18  1829     History   Chief Complaint Chief Complaint  Patient presents with  . Shortness of Breath    HPI [name] [name] is a 78 y.o. female.  Patient presents via EMS with shortness of breath with nausea and vomiting that woke her from sleep.  States she felt well when she went to bed.  She woke up several hours ago with difficulty breathing and one episode of vomiting.  She feels better at this time.  Notable history for ESRD on dialysis, last session was earlier in the day on February 12.  Denies any missed sessions.  Patient states EMS was called to her dialysis center because of bleeding from the fistula but this resolved.  She did complete her whole session.  She does make some urine.  She denies any history of COPD or CHF.  Denies any chest pain.  She has a cough that is chronic and nonproductive.  No fever.  No abdominal pain, leg swelling.  She does have pain in her left knee with a known patellar fracture from 5 days ago.  The history is provided by the patient and the EMS personnel.  Shortness of Breath  Associated symptoms: cough and vomiting   Associated symptoms: no abdominal pain, no chest pain and no fever     Past Medical History:  Diagnosis Date  . DEPRESSION   . DIABETES MELLITUS, TYPE I   . GERD   . GLAUCOMA   . Headache(784.0)   . Hepatitis B carrier ([place])    05/2009: neg Hep C; Hep B: core pos, Surf neg; fatty liver US 8/10 - 7/13  . HYPERLIPIDEMIA   . HYPERTENSION   . Kidney failure    dialysis 3 times per week  . OSTEOPENIA   . POSTMENOPAUSAL STATUS   . Pulmonary edema   . Unspecified vitamin D deficiency     Patient Active Problem List   Diagnosis Date Noted  . AV graft malfunction (HCC) 09/03/2018  . Urinary tract infection without hematuria 08/21/2018  . Memory loss 08/21/2018  . Anemia, chronic disease 03/01/2018  . Vasovagal  syncope 03/01/2018  . Macrocytic anemia 01/28/2018  . Orthostatic syncope 01/28/2018  . EKG, abnormal   . Acute metabolic encephalopathy 93/71/6967  . Influenza 11/16/2017  . Fever in adult 11/15/2017  . Demand ischemia ([place]) 11/10/2017  . Normocytic anemia 11/10/2017  . Aortic atherosclerosis ([place]) 11/10/2017  . Respiratory failure with hypoxia ([place]) 08/18/2017  . Volume overload 08/18/2017  . Dialysis patient, noncompliant ([place]) 08/17/2017  . Thumb pain, left 12/22/2016  . Chronic diastolic heart failure ([place]) 11/25/2016  . ESRD (end stage renal disease) on dialysis ([place]) 06/25/2014  . Malignant hypertension 05/17/2014  . Chest pain 05/17/2014  . Elevated troponin 05/17/2014  . Hypertensive emergency 05/17/2014  . NSTEMI (non-ST elevated myocardial infarction) ([place]) 05/17/2014  . Chronic kidney disease, stage IV (severe) ([place]) 12/05/2013  . GERD 06/17/2009  . Hepatitis B carrier ([place]) 06/17/2009  . DEPRESSION 05/20/2009  . Unspecified glaucoma 05/20/2009  . HEADACHE 05/20/2009  . CARDIAC MURMUR 05/20/2009  . OSTEOPENIA 02/15/2008  . POSTMENOPAUSAL STATUS 12/21/2007  . Hyperlipidemia LDL goal <70 09/21/2007  . Essential hypertension 09/13/2007  . Type II diabetes mellitus with nephropathy [facility])     Past Surgical History:  Procedure Laterality Date  . A/V SHUNTOGRAM N/A 09/06/2018   Procedure: A/V SHUNTOGRAM - Left AV;  Surgeon: Marty Heck, MD;  Location: [place] [facility];  Service: Cardiovascular;  Laterality: N/A;  . AV FISTULA PLACEMENT Left 05/29/2014   Procedure: INSERTION OF ARTERIOVENOUS (AV) GORE-TEX GRAFT ARM-LEFT;  Surgeon: Angelia Mould, MD;  Location: [place];  Service: Vascular;  Laterality: Left;  . CHOLECYSTECTOMY  02/12/07  . INSERTION OF DIALYSIS CATHETER Right 05/27/2014   Procedure: INSERTION OF DIALYSIS CATHETER;  Surgeon: Angelia Mould, MD;  Location: [place];  Service: Vascular;  Laterality: Right;  . PERIPHERAL VASCULAR BALLOON  ANGIOPLASTY Left 09/06/2018   Procedure: PERIPHERAL VASCULAR BALLOON ANGIOPLASTY;  Surgeon: Marty Heck, MD;  Location: [place] [facility];  Service: Cardiovascular;  Laterality: Left;  arm shunt  . REFRACTIVE SURGERY  07/29/09   Dr. Bing Plume  . TONSILLECTOMY       OB History   No obstetric history on file.      Home Medications    Prior to Admission medications   Medication Sig Start Date End Date Taking? Authorizing Provider  Amino Acids-Protein Hydrolys (FEEDING SUPPLEMENT, PRO-STAT SUGAR FREE 64,) LIQD Take 30 mLs by mouth 2 (two) times daily. 10/18/18   Kayleen Memos, DO  aspirin (ASPIRIN CHILDRENS) 81 MG chewable tablet Chew 1 tablet (81 mg total) by mouth daily. 10/18/18 10/18/19  Kayleen Memos, DO  B Complex-C-Zn-Folic Acid (DIALYVITE/ZINC) TABS Take 1 tablet by mouth daily.    [provider]  HYDROcodone-acetaminophen (NORCO/VICODIN) 5-325 MG tablet Take 1 tablet by mouth every 6 (six) hours as needed. 12/08/18   Drenda Freeze, MD  Multiple Vitamin (MULTIVITAMIN WITH MINERALS) TABS tablet Take 1 tablet by mouth daily. 10/19/18   Kayleen Memos, DO  nitroGLYCERIN (NITROSTAT) 0.4 MG SL tablet Place 1 tablet (0.4 mg total) under the tongue every 5 (five) minutes as needed for chest pain. 03/01/76   Delora Fuel, MD  sevelamer carbonate (RENVELA) 800 MG tablet Take 2 tablets (1,600 mg total) by mouth 3 (three) times daily with meals. 10/18/18   Kayleen Memos, DO    Family History Family History  Problem Relation Age of Onset  . Kidney disease Mother   . Coronary artery disease Other   . Diabetes Other   . Hypertension Other   . Arthritis Other   . Kidney disease Sister     Social History Social History   Tobacco Use  . Smoking status: Never Smoker  . Smokeless tobacco: Never Used  . 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 Retired Consulting civil engineer  Substance Use Topics  . Alcohol use: No  . Drug use: No     Allergies     Hydralazine and Robaxin [methocarbamol]   Review of Systems Review of Systems  Constitutional: Negative for activity change, appetite change and fever.  HENT: Negative for congestion.   Respiratory: Positive for cough and shortness of breath. Negative for chest tightness.   Cardiovascular: Negative for chest pain and leg swelling.  Gastrointestinal: Positive for nausea and vomiting. Negative for abdominal pain.  Genitourinary: Negative for dysuria and hematuria.  Musculoskeletal: Negative for arthralgias and myalgias.  Skin: Negative for wound.  Neurological: Negative for dizziness and weakness.    all other systems are negative except as noted in the HPI and PMH.    Physical Exam Updated Vital Signs BP (!) 88/65 (BP Location: Right Arm)   Pulse 72   Temp 98.5 F (36.9 C) (Oral)   Resp 18   SpO2 100%   Physical Exam Vitals signs  and nursing note reviewed.  Constitutional:      General: She is not in acute distress.    Appearance: She is well-developed.  HENT:     Head: Normocephalic and atraumatic.     Mouth/Throat:     Pharynx: No oropharyngeal exudate.  Eyes:     Conjunctiva/sclera: Conjunctivae normal.     Pupils: Pupils are equal, round, and reactive to light.  Neck:     Musculoskeletal: Normal range of motion and neck supple.     Comments: No meningismus. Cardiovascular:     Rate and Rhythm: Normal rate and regular rhythm.     Heart sounds: Normal heart sounds. No murmur.  Pulmonary:     Effort: Pulmonary effort is normal. No respiratory distress.     Breath sounds: Normal breath sounds. No wheezing or rhonchi.  Abdominal:     Palpations: Abdomen is soft.     Tenderness: There is no abdominal tenderness. There is no guarding or rebound.  Musculoskeletal: Normal range of motion.        General: No tenderness.     Comments: Left upper extremity graft present with thrill and bruit.  No active bleeding  Skin:    General: Skin is warm.  Neurological:      Mental Status: She is alert and oriented to person, place, and time.     Cranial Nerves: No cranial nerve deficit.     Motor: No abnormal muscle tone.     Coordination: Coordination normal.     Comments: No ataxia on finger to nose bilaterally. No pronator drift. 5/5 strength throughout. CN 2-12 intact.Equal grip strength. Sensation intact.   Psychiatric:        Behavior: Behavior normal.      ED Treatments / Results  Labs (all labs ordered are listed, but only abnormal results are displayed) Labs Reviewed  CBC WITH DIFFERENTIAL/PLATELET - Abnormal; Notable for the following components:      Result Value   WBC 2.0 (*)    RBC 2.14 (*)    Hemoglobin 6.6 (*)    HCT 22.3 (*)    MCV 104.2 (*)    MCHC 29.6 (*)    RDW 18.1 (*)    Platelets 66 (*)    Neutro Abs 1.3 (*)    Lymphs Abs 0.4 (*)    All other components within normal limits  BASIC METABOLIC PANEL - Abnormal; Notable for the following components:   Glucose, Bld 112 (*)    BUN 68 (*)    Creatinine, Ser 7.43 (*)    Calcium 8.7 (*)    GFR calc non Af Amer 5 (*)    GFR calc Af Amer 6 (*)    All other components within normal limits  OCCULT BLOOD X 1 CARD TO LAB, STOOL  I-STAT TROPONIN, ED  TYPE AND SCREEN  PREPARE RBC (CROSSMATCH)    EKG EKG Interpretation  Date/Time:  Thursday December 13 2018 03:56:01 EST Ventricular Rate:  70 PR Interval:    QRS Duration: 101 QT Interval:  418 QTC Calculation: 451 R Axis:   46 Text Interpretation:  Sinus rhythm RSR' in V1 or V2, right VCD or RVH Probable LVH with secondary repol abnrm No significant change was found Confirmed by Ezequiel Essex [phone]) on 12/13/2018 4:39:13 AM   Radiology Dg Chest 2 View  Result Date: 12/13/2018 CLINICAL DATA:  Shortness of breath and vomiting EXAM: CHEST - 2 VIEW COMPARISON:  12/08/2018 FINDINGS: Chronic cardiomegaly. Vascular stent in the left arm.  There is no edema, consolidation, effusion, or pneumothorax. IMPRESSION: Stable from prior.   No evidence of acute disease. Cardiomegaly. Electronically Signed   By: Monte Fantasia M.D.   On: 12/13/2018 04:39    Procedures Procedures (including critical care time)  Medications Ordered in ED Medications - No data to display   Initial Impression / Assessment and Plan / ED Course  I have reviewed the triage vital signs and the nursing notes.  Pertinent labs & imaging results that were available during my care of the patient were reviewed by me and considered in my medical decision making (see chart for details).    Episode of shortness of breath that woke her from sleep that is since resolved.  No chest pain.   EKG unchanged.  Chest x-ray negative.  Lungs clear without hypoxia.  Patient feels improved.  Found to have anemia of 6.6.  His baseline is in the 8-9 range.  She was denies any black or bloody stools and has declined rectal exam.  Suspect her anemia is likely secondary to acute blood loss.  Her initial hypotension has improved.  Patient states she has needed transfusions in the past and she is agreeable to it.  She declines rectal exam.  Blood transfusion ordered.  Admission discussed with Dr. Hal Hope.  CRITICAL CARE Performed by: Ezequiel Essex Total critical care time: 31 minutes Critical care time was exclusive of separately billable procedures and treating other patients. Critical care was necessary to treat or prevent imminent or life-threatening deterioration. Critical care was time spent personally by me on the following activities: development of treatment plan with patient and/or surrogate as well as nursing, discussions with consultants, evaluation of patient's response to treatment, examination of patient, obtaining history from patient or surrogate, ordering and performing treatments and interventions, ordering and review of laboratory studies, ordering and review of radiographic studies, pulse oximetry and re-evaluation of patient's condition.   Final  Clinical Impressions(s) / ED Diagnoses   Final diagnoses:  None    ED Discharge Orders    None       Dmonte Maher, Annie Main, MD 12/13/18 [phone]

## 2018-12-13 NOTE — Progress Notes (Signed)
Pt received 1 unit of PRBC. Tolerated well.

## 2018-12-13 NOTE — ED Notes (Signed)
Patient refuses stool occult blood test at this time. MD at bedside.

## 2018-12-13 NOTE — Consult Note (Addendum)
[facility] Renal Consultation Note  Indication for Consultation:  Management of ESRD/hemodialysis; anemia, hypertension/volume and secondary hyperparathyroidism  HPI: [name] [name] is a 78 y.o. female with ESRD sec. DM/ HTN  start [facility] admit 05/17/14 ,Type 2 DM , HO Uncontrolled Htn,Ho Non adherence to Medical therapy, NSTEMI  2dech  60-65 % ef  05/17/14 admit, [facility] 3/30 - 01/29/18 orthostatic syncope - and symptomatic anemia - hgb 7.4 tx 1 unit > 8.5 at d/c  mch 12/11-14/19  CP (ACS  R/O)/ vol ^ pulm edema  , weakness refused nhp.     Now admitted to Obs  With sob HGB 6.6 ,( refused rectal exam )  CXR  =NAD , K4.0  Bun 68 scr 7.43   Bp on admit 102/67 , after One unit PRBcs  127/83 now . Last op [facility] HGB 7.5 (12/10/18)  8.5 (12/05/18) 7.9 hgb (11/28/18)  Awoken from sleep in obs room and poor Historian  aware 2020, not sure location,recognizes me from [facility] ,doesn't remember  Calling EMS  History from [facility] Yesterday  only 15 min HD secondary to [place] bleeding , bleeding controlled at kidney center  and set up for shuntogram  this am at [facility] center . BUT awoken sob and called ems .        Past Medical History:  Diagnosis Date  . DEPRESSION   . DIABETES MELLITUS, TYPE I   . ESRD (end stage renal disease) on dialysis ([place])    MWF HD  . GERD   . GLAUCOMA   . Headache(784.0)   . Hepatitis B carrier ([place])    05/2009: neg Hep C; Hep B: core pos, Surf neg; fatty liver US 8/10 - 7/13  . HYPERLIPIDEMIA   . HYPERTENSION   . OSTEOPENIA   . POSTMENOPAUSAL STATUS   . Pulmonary edema   . Unspecified vitamin D deficiency     Past Surgical History:  Procedure Laterality Date  . A/V SHUNTOGRAM N/A 09/06/2018   Procedure: A/V SHUNTOGRAM - Left AV;  Surgeon: Marty Heck, MD;  Location: [place] [facility];  Service: Cardiovascular;  Laterality: N/A;  . AV FISTULA PLACEMENT Left 05/29/2014   Procedure: INSERTION OF ARTERIOVENOUS (AV) GORE-TEX GRAFT ARM-LEFT;   Surgeon: Angelia Mould, MD;  Location: [place];  Service: Vascular;  Laterality: Left;  . CHOLECYSTECTOMY  02/12/07  . INSERTION OF DIALYSIS CATHETER Right 05/27/2014   Procedure: INSERTION OF DIALYSIS CATHETER;  Surgeon: Angelia Mould, MD;  Location: [place];  Service: Vascular;  Laterality: Right;  . PERIPHERAL VASCULAR BALLOON ANGIOPLASTY Left 09/06/2018   Procedure: PERIPHERAL VASCULAR BALLOON ANGIOPLASTY;  Surgeon: Marty Heck, MD;  Location: [place] [facility];  Service: Cardiovascular;  Laterality: Left;  arm shunt  . REFRACTIVE SURGERY  07/29/09   Dr. Bing Plume  . TONSILLECTOMY        Family History  Problem Relation Age of Onset  . Kidney disease Mother   . Coronary artery disease Other   . Diabetes Other   . Hypertension Other   . Arthritis Other   . Kidney disease Sister       reports that she has never smoked. She has never used smokeless tobacco. She reports that she does not drink alcohol or use drugs.   Allergies  Allergen Reactions  . Hydralazine Itching  . Robaxin [Methocarbamol] Other (See Comments)    Dizziness     Prior to Admission medications   Medication Sig Start Date  End Date Taking? Authorizing Provider  HYDROcodone-acetaminophen (NORCO/VICODIN) 5-325 MG tablet Take 1 tablet by mouth every 6 (six) hours as needed. Patient taking differently: Take 1 tablet by mouth every 6 (six) hours as needed for moderate pain.  12/08/18  Yes Drenda Freeze, MD  Multiple Vitamin (MULTIVITAMIN WITH MINERALS) TABS tablet Take 1 tablet by mouth daily. 10/19/18  Yes Kayleen Memos, DO  nitroGLYCERIN (NITROSTAT) 0.4 MG SL tablet Place 1 tablet (0.4 mg total) under the tongue every 5 (five) minutes as needed for chest pain. 05/31/26  Yes Delora Fuel, MD  sevelamer carbonate (RENVELA) 800 MG tablet Take 2 tablets (1,600 mg total) by mouth 3 (three) times daily with meals. 10/18/18  Yes Kayleen Memos, DO     Anti-infectives (From admission, onward)   None       Results for orders placed or performed during the hospital encounter of 12/13/18 (from the past 48 hour(s))  CBC with Differential/Platelet     Status: Abnormal   Collection Time: 12/13/18  3:47 AM  Result Value Ref Range   WBC 2.0 (L) 4.0 - 10.5 K/uL   RBC 2.14 (L) 3.87 - 5.11 MIL/uL   Hemoglobin 6.6 (LL) 12.0 - 15.0 g/dL    Comment: REPEATED TO VERIFY THIS CRITICAL RESULT HAS VERIFIED AND BEEN CALLED TO K. KIRKMAN,RN BY TERRAN TYSOR ON 02 13 2020 AT 0550, AND HAS BEEN READ BACK.     HCT 22.3 (L) 36.0 - 46.0 %   MCV 104.2 (H) 80.0 - 100.0 fL   MCH 30.8 26.0 - 34.0 pg   MCHC 29.6 (L) 30.0 - 36.0 g/dL   RDW 18.1 (H) 11.5 - 15.5 %   Platelets 66 (L) 150 - 400 K/uL    Comment: REPEATED TO VERIFY PLATELET COUNT CONFIRMED BY SMEAR Immature Platelet Fraction may be clinically indicated, consider ordering this additional test [id number]    nRBC 0.0 0.0 - 0.2 %   Neutrophils Relative % 67 %   Neutro Abs 1.3 (L) 1.7 - 7.7 K/uL   Lymphocytes Relative 18 %   Lymphs Abs 0.4 (L) 0.7 - 4.0 K/uL   Monocytes Relative 9 %   Monocytes Absolute 0.2 0.1 - 1.0 K/uL   Eosinophils Relative 4 %   Eosinophils Absolute 0.1 0.0 - 0.5 K/uL   Basophils Relative 1 %   Basophils Absolute 0.0 0.0 - 0.1 K/uL   Immature Granulocytes 1 %   Abs Immature Granulocytes 0.01 0.00 - 0.07 K/uL    Comment: Performed at [facility], [address], [place], [place] [address]  Basic metabolic panel     Status: Abnormal   Collection Time: 12/13/18  3:47 AM  Result Value Ref Range   Sodium 140 135 - 145 mmol/L   Potassium 4.0 3.5 - 5.1 mmol/L   Chloride 101 98 - 111 mmol/L   CO2 25 22 - 32 mmol/L   Glucose, Bld 112 (H) 70 - 99 mg/dL   BUN 68 (H) 8 - 23 mg/dL   Creatinine, Ser 7.43 (H) 0.44 - 1.00 mg/dL   Calcium 8.7 (L) 8.9 - 10.3 mg/dL   GFR calc non Af Amer 5 (L) >60 mL/min   GFR calc Af Amer 6 (L) >60 mL/min   Anion gap 14 5 - 15    Comment: Performed at [facility] [address],  [place],  [address]  I-stat troponin, ED     Status: None   Collection Time: 12/13/18  3:53  AM  Result Value Ref Range   Troponin i, poc 0.04 0.00 - 0.08 ng/mL   Comment 3            Comment: Due to the release kinetics of cTnI, a negative result within the first hours of the onset of symptoms does not rule out myocardial infarction with certainty. If myocardial infarction is still suspected, repeat the test at appropriate intervals.   Type and screen [place]     Status: None (Preliminary result)   Collection Time: 12/13/18  6:36 AM  Result Value Ref Range   ABO/RH(D) B POS    Antibody Screen NEG    Sample Expiration 12/16/2018    Unit Number [id number]    Blood Component Type RED CELLS,LR    Unit division 00    Status of Unit ISSUED    Transfusion Status OK TO TRANSFUSE    Crossmatch Result      Compatible Performed at [facility], [place] [address] [place], [place] [address]   Prepare RBC     Status: None   Collection Time: 12/13/18  6:36 AM  Result Value Ref Range   Order Confirmation      ORDER PROCESSED BY BLOOD BANK Performed at [facility], [place] [address] [place], [place] [address]     ROS: poor historian currently  pleasantly confused   Physical Exam: Vitals:   12/13/18 0858 12/13/18 1123  BP: 113/86 127/83  Pulse: 75 70  Resp: 18 18  Temp: 98.2 F (36.8 C) 98 F (36.7 C)  SpO2: 100% 100%     General: Thin elderly AAF nad pleasanlt ly confused  HEENT: ,Not icteric , MMM  Neck: supple, no jvd Heart: RRR 1/6/sem, no rub or gallop Lungs: CTA unlabored breathjing  Abdomen: BS pos , soft , NT, ND Extremities: No pedal edema  Skin: No overt rash warm dry Neuro: pleasantly confused  2020, not sure location , recognizes me from [facility] center, moves all extrem ,   Dialysis Access: LUA AVGG  has  Pos bruit and thrill no hematoma or swelling noted , no erythema  Or bleeding site identified on exam   Dialysis Orders:  Center: [facility]  on MWF . EDW 58 kg HD Bath 3k,2.25ca  Time 3hr 10min   Heparin NONE.   Access LUA AVGG    Hectorol  4 mcg IV/HD Mirecera  225 mcg  (last given 12/10/18)  Units IV/HD   Venofer  50mg   Wkly     Assessment/Plan 1. Anemia from ?bleeding AVGG( ABL) / Anemia ESRD= sp 1 unit prbcs in room today , reck pre hd , NO Hep  Hd / IR Consult to Shuntogram  In Am /   Last ESA max given  12/10/18 , Iron  Weekly hd  2. ESRD - HD =MWF  Only  2min hd yesterday at [facility]  Per charge rn  Place on hd today to eval avgg  3. Hypertension/volume  -bp ok , last week edw noted increased / no bp meds on medlist op 4.  Dementia/ Ams almost baseline ,element of anemia /acute illness-  5. Metabolic bone disease -  Iv hect. on hd, binders with meals 6. Nutrition - renal carb mod / renal vitamin/  Protein supplement as needed  Ernest Haber, PA-C [place] [phone] 12/13/2018, 2:54 PM   Pt seen, examined and agree w A/P as above.  [facility] 12/13/2018, 4:36 PM

## 2018-12-14 ENCOUNTER — Encounter (HOSPITAL_COMMUNITY): Payer: Self-pay | Admitting: Interventional Radiology

## 2018-12-14 ENCOUNTER — Inpatient Hospital Stay (HOSPITAL_COMMUNITY): Payer: Medicare HMO

## 2018-12-14 ENCOUNTER — Encounter: Payer: Self-pay | Admitting: Physician Assistant

## 2018-12-14 DIAGNOSIS — I132 Hypertensive heart and chronic kidney disease with heart failure and with stage 5 chronic kidney disease, or end stage renal disease: Secondary | ICD-10-CM | POA: Diagnosis present

## 2018-12-14 DIAGNOSIS — J9601 Acute respiratory failure with hypoxia: Secondary | ICD-10-CM | POA: Diagnosis present

## 2018-12-14 DIAGNOSIS — E1122 Type 2 diabetes mellitus with diabetic chronic kidney disease: Secondary | ICD-10-CM | POA: Diagnosis present

## 2018-12-14 DIAGNOSIS — T82318A Breakdown (mechanical) of other vascular grafts, initial encounter: Secondary | ICD-10-CM | POA: Diagnosis present

## 2018-12-14 DIAGNOSIS — Z841 Family history of disorders of kidney and ureter: Secondary | ICD-10-CM | POA: Diagnosis not present

## 2018-12-14 DIAGNOSIS — F329 Major depressive disorder, single episode, unspecified: Secondary | ICD-10-CM | POA: Diagnosis present

## 2018-12-14 DIAGNOSIS — Z8249 Family history of ischemic heart disease and other diseases of the circulatory system: Secondary | ICD-10-CM | POA: Diagnosis not present

## 2018-12-14 DIAGNOSIS — D62 Acute posthemorrhagic anemia: Secondary | ICD-10-CM | POA: Diagnosis not present

## 2018-12-14 DIAGNOSIS — T82590A Other mechanical complication of surgically created arteriovenous fistula, initial encounter: Secondary | ICD-10-CM | POA: Diagnosis not present

## 2018-12-14 DIAGNOSIS — K219 Gastro-esophageal reflux disease without esophagitis: Secondary | ICD-10-CM | POA: Diagnosis present

## 2018-12-14 DIAGNOSIS — D649 Anemia, unspecified: Secondary | ICD-10-CM

## 2018-12-14 DIAGNOSIS — Z79899 Other long term (current) drug therapy: Secondary | ICD-10-CM | POA: Diagnosis not present

## 2018-12-14 DIAGNOSIS — Z888 Allergy status to other drugs, medicaments and biological substances status: Secondary | ICD-10-CM | POA: Diagnosis not present

## 2018-12-14 DIAGNOSIS — Y712 Prosthetic and other implants, materials and accessory cardiovascular devices associated with adverse incidents: Secondary | ICD-10-CM | POA: Diagnosis present

## 2018-12-14 DIAGNOSIS — N186 End stage renal disease: Secondary | ICD-10-CM

## 2018-12-14 DIAGNOSIS — E8889 Other specified metabolic disorders: Secondary | ICD-10-CM | POA: Diagnosis present

## 2018-12-14 DIAGNOSIS — Z992 Dependence on renal dialysis: Secondary | ICD-10-CM

## 2018-12-14 DIAGNOSIS — I10 Essential (primary) hypertension: Secondary | ICD-10-CM | POA: Diagnosis not present

## 2018-12-14 DIAGNOSIS — H409 Unspecified glaucoma: Secondary | ICD-10-CM | POA: Diagnosis present

## 2018-12-14 DIAGNOSIS — F0390 Unspecified dementia without behavioral disturbance: Secondary | ICD-10-CM | POA: Diagnosis present

## 2018-12-14 DIAGNOSIS — I5032 Chronic diastolic (congestive) heart failure: Secondary | ICD-10-CM | POA: Diagnosis present

## 2018-12-14 DIAGNOSIS — Z79891 Long term (current) use of opiate analgesic: Secondary | ICD-10-CM | POA: Diagnosis not present

## 2018-12-14 DIAGNOSIS — Z9119 Patient's noncompliance with other medical treatment and regimen: Secondary | ICD-10-CM | POA: Diagnosis not present

## 2018-12-14 DIAGNOSIS — M8580 Other specified disorders of bone density and structure, unspecified site: Secondary | ICD-10-CM | POA: Diagnosis present

## 2018-12-14 DIAGNOSIS — E785 Hyperlipidemia, unspecified: Secondary | ICD-10-CM | POA: Diagnosis present

## 2018-12-14 DIAGNOSIS — D631 Anemia in chronic kidney disease: Secondary | ICD-10-CM | POA: Diagnosis present

## 2018-12-14 HISTORY — PX: IR DIALY SHUNT INTRO NEEDLE/INTRACATH INITIAL W/IMG LEFT: IMG6102

## 2018-12-14 LAB — PROTIME-INR
INR: 1.19
PROTHROMBIN TIME: 15 s (ref 11.4–15.2)

## 2018-12-14 LAB — TYPE AND SCREEN
ABO/RH(D): B POS
Antibody Screen: NEGATIVE
UNIT DIVISION: 0

## 2018-12-14 LAB — GLUCOSE, CAPILLARY: Glucose-Capillary: 79 mg/dL (ref 70–99)

## 2018-12-14 LAB — CBC
HCT: 26.4 % — ABNORMAL LOW (ref 36.0–46.0)
Hemoglobin: 8.3 g/dL — ABNORMAL LOW (ref 12.0–15.0)
MCH: 31.6 pg (ref 26.0–34.0)
MCHC: 31.4 g/dL (ref 30.0–36.0)
MCV: 100.4 fL — ABNORMAL HIGH (ref 80.0–100.0)
Platelets: 72 10*3/uL — ABNORMAL LOW (ref 150–400)
RBC: 2.63 MIL/uL — ABNORMAL LOW (ref 3.87–5.11)
RDW: 19 % — ABNORMAL HIGH (ref 11.5–15.5)
WBC: 2.8 10*3/uL — AB (ref 4.0–10.5)
nRBC: 0 % (ref 0.0–0.2)

## 2018-12-14 LAB — BPAM RBC
Blood Product Expiration Date: 202002282359
ISSUE DATE / TIME: 202002130829
Unit Type and Rh: 7300

## 2018-12-14 MED ORDER — HALOPERIDOL LACTATE 5 MG/ML IJ SOLN
2.5000 mg | Freq: Once | INTRAMUSCULAR | Status: AC
  Administered 2018-12-14: 2.5 mg via INTRAVENOUS
  Filled 2018-12-14: qty 1

## 2018-12-14 MED ORDER — IOPAMIDOL (ISOVUE-300) INJECTION 61%
INTRAVENOUS | Status: AC
  Administered 2018-12-14: 16 mL
  Filled 2018-12-14: qty 100

## 2018-12-14 MED ORDER — LIDOCAINE HCL 1 % IJ SOLN
INTRAMUSCULAR | Status: AC
  Filled 2018-12-14: qty 20

## 2018-12-14 NOTE — Progress Notes (Signed)
Patient ambulating in room with personal clothes on, stating she will not have procedure done because dialysis went well 2/13. Patient and family educated on the benefits of IR fistulogram and after speaking with the MD patient agrees to have procedure done.

## 2018-12-14 NOTE — Plan of Care (Signed)
  Problem: Education: Goal: Knowledge of General Education information will improve Description: Including pain rating scale, medication(s)/side effects and non-pharmacologic comfort measures Outcome: Progressing   Problem: Clinical Measurements: Goal: Ability to maintain clinical measurements within normal limits will improve Outcome: Progressing   

## 2018-12-14 NOTE — Progress Notes (Signed)
Patient has grown increasingly confused and began to experience hallucinations. Thought the picture on her bathroom door was her husband. I have had to remind of repeatedly of her situation. She is unsteady on her feet and refuses to lay down in the bed. She remains seated on the side of the bed. Bed alarm is on to prevent patient injury. She has been moved closer to the desk for safety. Notified on call Physician to make him aware. An order was given for haldol. Given to patient. Monitoring for safety.

## 2018-12-14 NOTE — Progress Notes (Signed)
  Report given to nurse for [phone] Family updated about transfer to inpatient

## 2018-12-14 NOTE — Progress Notes (Addendum)
  [facility] Progress Note   Subjective:  Seen in room. Confused. Breathing comfortable on RA. Denies CP/SOB For IR fistulogram today   Objective Vitals:   12/13/18 2007 12/13/18 2050 12/14/18 0000 12/14/18 0500  BP: 104/63 116/77 116/74 (!) 178/84  Pulse: 89 95 89 85  Resp: 18 20 18 16   Temp: 98.3 F (36.8 C) 97.8 F (36.6 C) 98.9 F (37.2 C) 99.3 F (37.4 C)  TempSrc: Oral Oral Oral Oral  SpO2: 99% 99% 100% 100%  Weight: 58.4 kg       Physical Exam General: Elderly female NAD  Heart: RRR III/VI systolic murmur  Lungs: CTAB  Abdomen: soft NTND  Extremities: No LE edema  Dialysis Access: LA AVG +bruit   Wt Readings from Last 3 Encounters:  12/13/18 58.4 kg  11/03/18 51.8 kg  10/17/18 51.8 kg   Weight change:    Additional Objective Labs: Basic Metabolic Panel: Recent Labs  Lab 12/13/18 0347 12/13/18 1706 12/14/18 0447  NA 140 142 137  K 4.0 3.8 3.8  CL 101 102 104  CO2 25 30 23   GLUCOSE 112* 87 90  BUN 68* 70* 26*  CREATININE 7.43* 7.71* 4.19*  CALCIUM 8.7* 8.6* 8.6*  PHOS  --  6.0*  --    CBC: Recent Labs  Lab 12/13/18 0347 12/13/18 1559 12/13/18 1706 12/14/18 0447  WBC 2.0* 2.7* 2.4* 2.8*  NEUTROABS 1.3*  --   --   --   HGB 6.6* 8.3* 7.9* 8.3*  HCT 22.3* 26.4* 25.4* 26.4*  MCV 104.2* 98.1 98.8 100.4*  PLT 66* 71* 70* 72*   Blood Culture    Component Value Date/Time   SDES URINE, CLEAN CATCH 11/23/2018 1721   SPECREQUEST  11/23/2018 1721    NONE Performed at [facility], [place] [address], [place], [place] [address]    CULT <10,000 COLONIES/mL INSIGNIFICANT GROWTH (A) 11/23/2018 1721   REPTSTATUS 11/24/2018 FINAL 11/23/2018 1721     Medications:  . sodium chloride   Intravenous Once  . docusate sodium  100 mg Oral BID  . metoprolol tartrate  10 mg Intravenous Once  . sevelamer carbonate  1,600 mg Oral TID WC  . sodium chloride flush  3 mL Intravenous Q12H    Dialysis Orders:  [facility] MWF 3.5h EDW 58 kg  Bath  3k,2.25ca   Heparin NONE.   Access LUA AVGG    Hectorol  4 mcg IV TIW  Mirecera  225 mcg IV q 2 weeks  (last given 12/10/18)   Venofer  50mg   IV q week   Assessment/Plan: 1. Bleeding AVG/symptomatic anemia  - For IR fistulogram today 2. ESRD - MWF. Had short HD yesterday.  Plan HD today on schedule  after fistulogram 3. HTN/volume- No BP meds. HD yesterday minimal UF. For HD today UF 2L  4. Anemia-  Hgb 6.6 on admission. (? 2/2 bleeding AVG) S/p 1u prbcs 2/14 > Hgb up 8.3. On max ESA as outpatient. Last dose 2/10. Continue weekly Fe  5. Metabolic Bone Disease- Continue VDRA/binders  6. Dementia. Appears at baseline status  7. Nutrition - Renal diet/vitamins. Prot supp as needed   Lynnda Child PA-C [facility] Pager [phone] 12/14/2018,9:42 AM  LOS: 0 days   Pt seen, examined and agree w A/P as above.  [facility] 12/14/2018, 2:30 PM

## 2018-12-14 NOTE — Consult Note (Signed)
Chief Complaint: Patient was seen in consultation today for left upper arm fistula evaluation and possible intervention Chief Complaint  Patient presents with  . Shortness of Breath   at the request of Dr Mickel Crow  Supervising Physician: Corrie Mckusick  Patient Status: [facility] - [facility]  History of Present Illness: [name] [name] is a 78 y.o. female   ESRD HTN; DM Noncompliance  CAD/NSTEMI Anemia  Left upper arm fistula bleeding at dialysis center Unable to complete dialysis  Left fistula shuntogram and intervention 08/2018 1. Angioplasty of left upper arm graft (6 mm x 60 mm Lutonix drug coated balloon) 2. Angioplasty of left subclavian vein (8 mm x 40 mm [place]ang)  Last use 2/13 Incomplete dialysis  Pt became confused last pm Hallucinating  Consented Dtr [name] [name] via phone for procedure  Past Medical History:  Diagnosis Date  . DEPRESSION   . DIABETES MELLITUS, TYPE I   . ESRD (end stage renal disease) on dialysis ([place])    MWF HD  . GERD   . GLAUCOMA   . Headache(784.0)   . Hepatitis B carrier ([place])    05/2009: neg Hep C; Hep B: core pos, Surf neg; fatty liver US 8/10 - 7/13  . HYPERLIPIDEMIA   . HYPERTENSION   . OSTEOPENIA   . POSTMENOPAUSAL STATUS   . Pulmonary edema   . Unspecified vitamin D deficiency     Past Surgical History:  Procedure Laterality Date  . A/V SHUNTOGRAM N/A 09/06/2018   Procedure: A/V SHUNTOGRAM - Left AV;  Surgeon: Marty Heck, MD;  Location: [place] [facility];  Service: Cardiovascular;  Laterality: N/A;  . AV FISTULA PLACEMENT Left 05/29/2014   Procedure: INSERTION OF ARTERIOVENOUS (AV) GORE-TEX GRAFT ARM-LEFT;  Surgeon: Angelia Mould, MD;  Location: [place];  Service: Vascular;  Laterality: Left;  . CHOLECYSTECTOMY  02/12/07  . INSERTION OF DIALYSIS CATHETER Right 05/27/2014   Procedure: INSERTION OF DIALYSIS CATHETER;  Surgeon: Angelia Mould, MD;  Location: [place];  Service: Vascular;  Laterality: Right;  .  PERIPHERAL VASCULAR BALLOON ANGIOPLASTY Left 09/06/2018   Procedure: PERIPHERAL VASCULAR BALLOON ANGIOPLASTY;  Surgeon: Marty Heck, MD;  Location: [place] [facility];  Service: Cardiovascular;  Laterality: Left;  arm shunt  . REFRACTIVE SURGERY  07/29/09   Dr. Bing Plume  . TONSILLECTOMY      Allergies: Hydralazine and Robaxin [methocarbamol]  Medications: Prior to Admission medications   Medication Sig Start Date End Date Taking? Authorizing Provider  HYDROcodone-acetaminophen (NORCO/VICODIN) 5-325 MG tablet Take 1 tablet by mouth every 6 (six) hours as needed. Patient taking differently: Take 1 tablet by mouth every 6 (six) hours as needed for moderate pain.  12/08/18  Yes Drenda Freeze, MD  Multiple Vitamin (MULTIVITAMIN WITH MINERALS) TABS tablet Take 1 tablet by mouth daily. 10/19/18  Yes Kayleen Memos, DO  nitroGLYCERIN (NITROSTAT) 0.4 MG SL tablet Place 1 tablet (0.4 mg total) under the tongue every 5 (five) minutes as needed for chest pain. 4/0/97  Yes Delora Fuel, MD  sevelamer carbonate (RENVELA) 800 MG tablet Take 2 tablets (1,600 mg total) by mouth 3 (three) times daily with meals. 10/18/18  Yes Kayleen Memos, DO     Family History  Problem Relation Age of Onset  . Kidney disease Mother   . Coronary artery disease Other   . Diabetes Other   . Hypertension Other   . Arthritis Other   . Kidney disease Sister     Social History  Socioeconomic History  . Marital status: Married    Spouse name: Not on file  . Number of children: Not on file  . Years of education: Not on file  . Highest education level: Not on file  Occupational History  . Not on file  Social Needs  . Financial resource strain: Not on file  . Food insecurity:    Worry: Not on file    Inability: Not on file  . Transportation needs:    Medical: Not on file    Non-medical: Not on file  Tobacco Use  . Smoking status: Never Smoker  . Smokeless tobacco: Never Used  . Tobacco comment:  Married x's 82yrs, 6 kids-scattered [employer]. Retired Consulting civil engineer  Substance and Sexual Activity  . Alcohol use: No  . Drug use: No  . Sexual activity: Not Currently    Birth control/protection: None  Lifestyle  . Physical activity:    Days per week: Not on file    Minutes per session: Not on file  . Stress: Not on file  Relationships  . Social connections:    Talks on phone: Not on file    Gets together: Not on file    Attends religious service: Not on file    Active member of club or organization: Not on file    Attends meetings of clubs or organizations: Not on file    Relationship status: Not on file  Other Topics Concern  . Not on file  Social History Narrative  . Not on file     Review of Systems: A 12 point ROS discussed and pertinent positives are indicated in the HPI above.  All other systems are negative.  Review of Systems  Constitutional: Positive for activity change and fatigue. Negative for fever.  Respiratory: Negative for shortness of breath.   Cardiovascular: Negative for chest pain.  Gastrointestinal: Negative for abdominal pain.  Neurological: Positive for weakness.  Psychiatric/Behavioral: Positive for confusion.    Vital Signs: BP (!) 178/84 (BP Location: Left Arm)   Pulse 85   Temp 99.3 F (37.4 C) (Oral)   Resp 16   Wt 128 lb 12 oz (58.4 kg) Comment: stood to scale   SpO2 100%   BMI 21.10 kg/m   Physical Exam Vitals signs reviewed.  Constitutional:      Comments: groggy  Cardiovascular:     Rate and Rhythm: Normal rate and regular rhythm.  Pulmonary:     Effort: Pulmonary effort is normal.     Breath sounds: Normal breath sounds.  Abdominal:     Palpations: Abdomen is soft.  Skin:    General: Skin is warm and dry.     Comments: Left arm dialysis fistula +pulse + thrill     Imaging: Dg Chest 2 View  Result Date: 12/13/2018 CLINICAL DATA:  Shortness of breath and vomiting EXAM: CHEST - 2 VIEW COMPARISON:  12/08/2018  FINDINGS: Chronic cardiomegaly. Vascular stent in the left arm. There is no edema, consolidation, effusion, or pneumothorax. IMPRESSION: Stable from prior.  No evidence of acute disease. Cardiomegaly. Electronically Signed   By: Monte Fantasia M.D.   On: 12/13/2018 04:39   Dg Chest 2 View  Result Date: 12/08/2018 CLINICAL DATA:  Patient status post fall. EXAM: CHEST - 2 VIEW COMPARISON:  Chest radiograph 11/23/2018 FINDINGS: Monitoring leads overlie the patient. Stable cardiomegaly. Aortic atherosclerosis. Bibasilar atelectasis. No large area pulmonary consolidation. No pleural effusion or pneumothorax. Thoracic spine degenerative changes. Cholecystectomy clips. IMPRESSION: No acute cardiopulmonary process. Cardiomegaly.  Electronically Signed   By: Lovey Newcomer M.D.   On: 12/08/2018 13:13   Dg Chest 2 View  Result Date: 11/23/2018 CLINICAL DATA:  Chronic weakness. EXAM: CHEST - 2 VIEW COMPARISON:  Single-view of the chest 11/03/2018. PA and lateral chest 10/14/2018. FINDINGS: There is cardiomegaly without edema. Aortic atherosclerosis noted. Trace right pleural effusion is seen. No consolidative process or pneumothorax. No acute or focal bony abnormality. IMPRESSION: Trace right pleural effusion. Cardiomegaly without edema. Atherosclerosis. Electronically Signed   By: Inge Rise M.D.   On: 11/23/2018 16:28   Dg Knee Complete 4 Views Left  Result Date: 12/08/2018 CLINICAL DATA:  Patient status post fall hitting the knee. Initial encounter. EXAM: LEFT KNEE - COMPLETE 4+ VIEW COMPARISON:  None. FINDINGS: Normal anatomic alignment. Chondrocalcinosis. Medial, lateral and patellofemoral compartment degenerative changes. No joint effusion. Extensive vascular calcifications. Transverse lucency demonstrated through the patella. IMPRESSION: There is a transverse lucency demonstrated through the patella concerning for nondisplaced patellar fracture. Electronically Signed   By: Lovey Newcomer M.D.   On: 12/08/2018  13:12   Dg Knee Complete 4 Views Right  Result Date: 12/08/2018 CLINICAL DATA:  78 year old female with bilateral knee pain after tripping and falling last night. EXAM: RIGHT KNEE - COMPLETE 4+ VIEW COMPARISON:  Concurrently obtained radiographs of the contralateral knee FINDINGS: No evidence of acute fracture or malalignment. No knee joint effusion. Normal bony mineralization without lytic or blastic osseous lesion. Chondrocalcinosis is present in the medial and lateral compartments. Mild enthesopathy at the superior pole of the patella. Extensive atherosclerotic vascular calcifications throughout the visualized arterial tree. IMPRESSION: 1. No evidence of acute fracture, malalignment or knee joint effusion. 2. Chondrocalcinosis. 3. Extensive atherosclerotic vascular calcifications. Electronically Signed   By: Jacqulynn Cadet M.D.   On: 12/08/2018 13:13    Labs:  CBC: Recent Labs    12/13/18 0347 12/13/18 1559 12/13/18 1706 12/14/18 0447  WBC 2.0* 2.7* 2.4* 2.8*  HGB 6.6* 8.3* 7.9* 8.3*  HCT 22.3* 26.4* 25.4* 26.4*  PLT 66* 71* 70* 72*    COAGS: Recent Labs    09/03/18 2128 11/23/18 1550 12/14/18 0447  INR 1.11 1.13 1.19  APTT 32  --   --     BMP: Recent Labs    11/23/18 1150 12/13/18 0347 12/13/18 1706 12/14/18 0447  NA 140 140 142 137  K 3.9 4.0 3.8 3.8  CL 102 101 102 104  CO2 23 25 30 23   GLUCOSE 107* 112* 87 90  BUN 52* 68* 70* 26*  CALCIUM 8.9 8.7* 8.6* 8.6*  CREATININE 7.14* 7.43* 7.71* 4.19*  GFRNONAA 5* 5* 5* 10*  GFRAA 6* 6* 5* 11*    LIVER FUNCTION TESTS: Recent Labs    09/03/18 2128 09/19/18 1222 09/28/18 1213  10/16/18 0331 10/17/18 1313 11/23/18 1550 12/13/18 1706  BILITOT 0.7 0.7 0.6  --   --   --  0.9  --   AST 50* 36 46*  --   --   --  27  --   ALT 27 25 29   --   --   --  20  --   ALKPHOS 152* 125 109  --   --   --  116  --   PROT 6.4* 7.0 6.5  --   --   --  7.4  --   ALBUMIN 2.9* 3.0* 2.9*   < > 2.4* 2.6* 3.0* 2.7*   < > = values  in this interval not displayed.  TUMOR MARKERS: No results for input(s): AFPTM, CEA, CA199, CHROMGRNA in the last 8760 hours.  Assessment and Plan:  ERD Admitted yesterday with bleeding from left arm dialysis fistula Request for fistulogram with possible intervention Possible tunneled dialysis catheter placement if needed Pts family is aware of procedure benefits and risks including but not ,imited to Infection; bleeding; damage to fistula Dtr is agreeable to proceed Consent signed via phone  Thank you for this interesting consult.  I greatly enjoyed meeting [name] [name] and [name] forward to participating in their care.  A copy of this report was sent to the requesting provider on this date.  Electronically Signed: Lavonia Drafts, PA-C 12/14/2018, 8:41 AM   I spent a total of 20 Minutes    in face to face in clinical consultation, greater than 50% of which was counseling/coordinating care for left arm fistulogram with possible intervention

## 2018-12-14 NOTE — Progress Notes (Signed)
Progress Note    [name] [name]  TJQ:[id number] DOB: 14-Oct-1941  DOA: 12/13/2018 PCP: Ann Held, DO    Brief Narrative:     Medical records reviewed and are as summarized below:  [name] [name] is an 78 y.o. female with medical history significant of ESRD on MWF HD; HTN; HLD; and DM presenting with SOB and a bleeding fistula.  She went to HD on 2/13 and her fistula starting hemorrhaging.   Assessment/Plan:   Principal Problem:   Acute respiratory failure with hypoxia (HCC) Active Problems:   Type II diabetes mellitus with nephropathy (HCC)   Essential hypertension   ESRD (end stage renal disease) on dialysis (HCC)   Chronic diastolic heart failure (HCC)   Anemia, chronic disease   Memory loss   AV graft malfunction (HCC)   AV graft malfunction, initial encounter ([place])   AV graft malfunction -IR consult in AM for shuntogram, as per Dr. Jonnie Finner -Reports significant blood loss at HD yesterday  ESRD on HD -Patient on chronic MWF HD  Anemia- ABLA plus anemia of CD -Baseline Hgb about 8-9 -She had significant blood loss yesterday and Hgb today is 6.6 -s/p PRBC -repeat Hgb 8.3  DM -Diet controlled -SSI  HTN -She is not currently taking medication for this issue -BP stable  Chronic diastolic CHF -7/62 echo with grade 2 diastolic dysfunction -2/63 echo with preserved systolic function but no comment about diastolic function  Memory loss -clearly demonstrates evidence of dementia -Her daughter reports that this is baseline for the patient ? Need to palliative care consult for GOC-- not sure the family understands prognosis  Family Communication/Anticipated D/C date and plan/Code Status   DVT prophylaxis: Lovenox ordered. Code Status: Full Code.  Family Communication: husband at beside-- came back x 2 to see daughter Disposition Plan: once HD access improved   Medical Consultants:    Renal  IR   Subjective:   Confused about  procedure-- says she had it done yesterday  Objective:    Vitals:   12/13/18 2050 12/14/18 0000 12/14/18 0500 12/14/18 1304  BP: 116/77 116/74 (!) 178/84 117/65  Pulse: 95 89 85 81  Resp: 20 18 16 19   Temp: 97.8 F (36.6 C) 98.9 F (37.2 C) 99.3 F (37.4 C) 98.6 F (37 C)  TempSrc: Oral Oral Oral Oral  SpO2: 99% 100% 100% 100%  Weight:        Intake/Output Summary (Last 24 hours) at 12/14/2018 1348 Last data filed at 12/14/2018 0015 Gross per 24 hour  Intake -  Output 381 ml  Net -381 ml   Filed Weights   12/13/18 1655 12/13/18 2007  Weight: 58.6 kg 58.4 kg    Exam: Up walking around room with shiny hat and shoes on Unsteady on feet Oriented to person but not situation rrr No LE Edema  Data Reviewed:   I have personally reviewed following labs and imaging studies:  Labs: Labs show the following:   Basic Metabolic Panel: Recent Labs  Lab 12/13/18 0347 12/13/18 1706 12/14/18 0447  NA 140 142 137  K 4.0 3.8 3.8  CL 101 102 104  CO2 25 30 23   GLUCOSE 112* 87 90  BUN 68* 70* 26*  CREATININE 7.43* 7.71* 4.19*  CALCIUM 8.7* 8.6* 8.6*  PHOS  --  6.0*  --    GFR Estimated Creatinine Clearance: 10.3 mL/min (A) (by C-G formula based on SCr of 4.19 mg/dL (H)). Liver Function Tests: Recent Labs  Lab  12/13/18 1706  ALBUMIN 2.7*   No results for input(s): LIPASE, AMYLASE in the last 168 hours. No results for input(s): AMMONIA in the last 168 hours. Coagulation profile Recent Labs  Lab 12/14/18 0447  INR 1.19    CBC: Recent Labs  Lab 12/13/18 0347 12/13/18 1559 12/13/18 1706 12/14/18 0447  WBC 2.0* 2.7* 2.4* 2.8*  NEUTROABS 1.3*  --   --   --   HGB 6.6* 8.3* 7.9* 8.3*  HCT 22.3* 26.4* 25.4* 26.4*  MCV 104.2* 98.1 98.8 100.4*  PLT 66* 71* 70* 72*   Cardiac Enzymes: No results for input(s): CKTOTAL, CKMB, CKMBINDEX, TROPONINI in the last 168 hours. BNP (last 3 results) No results for input(s): PROBNP in the last 8760 hours. CBG: Recent  Labs  Lab 12/14/18 1239  GLUCAP 79   D-Dimer: No results for input(s): DDIMER in the last 72 hours. Hgb A1c: No results for input(s): HGBA1C in the last 72 hours. Lipid Profile: No results for input(s): CHOL, HDL, LDLCALC, TRIG, CHOLHDL, LDLDIRECT in the last 72 hours. Thyroid function studies: No results for input(s): TSH, T4TOTAL, T3FREE, THYROIDAB in the last 72 hours.  Invalid input(s): FREET3 Anemia work up: No results for input(s): VITAMINB12, FOLATE, FERRITIN, TIBC, IRON, RETICCTPCT in the last 72 hours. Sepsis Labs: Recent Labs  Lab 12/13/18 0347 12/13/18 1559 12/13/18 1706 12/14/18 0447  WBC 2.0* 2.7* 2.4* 2.8*    Microbiology No results found for this or any previous visit (from the past 240 hour(s)).  Procedures and diagnostic studies:  Dg Chest 2 View  Result Date: 12/13/2018 CLINICAL DATA:  Shortness of breath and vomiting EXAM: CHEST - 2 VIEW COMPARISON:  12/08/2018 FINDINGS: Chronic cardiomegaly. Vascular stent in the left arm. There is no edema, consolidation, effusion, or pneumothorax. IMPRESSION: Stable from prior.  No evidence of acute disease. Cardiomegaly. Electronically Signed   By: Monte Fantasia M.D.   On: 12/13/2018 04:39    Medications:   . sodium chloride   Intravenous Once  . docusate sodium  100 mg Oral BID  . metoprolol tartrate  10 mg Intravenous Once  . sevelamer carbonate  1,600 mg Oral TID WC  . sodium chloride flush  3 mL Intravenous Q12H   Continuous Infusions:   LOS: 0 days   Geradine Girt  [facility]   How to contact the [facility] Attending or Consulting provider [place] or covering provider during after hours [place], for this patient?  1. Check the care team in [facility] and look for a) attending/consulting [facility] provider listed and b) the [facility] team listed 2. Log into [email or web address] and use [place]'s universal password to access. If you do not have the password, please contact the hospital operator. 3. Locate the [facility]  provider you are looking for under [facility] and page to a number that you can be directly reached. 4. If you still have difficulty reaching the provider, please page the [facility] (Director on Call) for the Hospitalists listed on amion for assistance.  12/14/2018, 1:48 PM

## 2018-12-15 LAB — BASIC METABOLIC PANEL
Anion gap: 10 (ref 5–15)
Anion gap: 9 (ref 5–15)
BUN: 26 mg/dL — AB (ref 8–23)
BUN: 36 mg/dL — ABNORMAL HIGH (ref 8–23)
CO2: 23 mmol/L (ref 22–32)
CO2: 24 mmol/L (ref 22–32)
Calcium: 8.6 mg/dL — ABNORMAL LOW (ref 8.9–10.3)
Calcium: 9 mg/dL (ref 8.9–10.3)
Chloride: 104 mmol/L (ref 98–111)
Chloride: 104 mmol/L (ref 98–111)
Creatinine, Ser: 4.19 mg/dL — ABNORMAL HIGH (ref 0.44–1.00)
Creatinine, Ser: 5.91 mg/dL — ABNORMAL HIGH (ref 0.44–1.00)
GFR calc Af Amer: 11 mL/min — ABNORMAL LOW (ref >60)
GFR calc Af Amer: 7 mL/min — ABNORMAL LOW (ref >60)
GFR calc non Af Amer: 10 mL/min — ABNORMAL LOW (ref >60)
GFR calc non Af Amer: 6 mL/min — ABNORMAL LOW (ref >60)
Glucose, Bld: 90 mg/dL (ref 70–99)
Glucose, Bld: 91 mg/dL (ref 70–99)
POTASSIUM: 3.8 mmol/L (ref 3.5–5.1)
Potassium: 4 mmol/L (ref 3.5–5.1)
Sodium: 137 mmol/L (ref 135–145)
Sodium: 137 mmol/L (ref 135–145)

## 2018-12-15 LAB — CBC
HCT: 27.8 % — ABNORMAL LOW (ref 36.0–46.0)
HEMOGLOBIN: 8.4 g/dL — AB (ref 12.0–15.0)
MCH: 30.7 pg (ref 26.0–34.0)
MCHC: 30.2 g/dL (ref 30.0–36.0)
MCV: 101.5 fL — ABNORMAL HIGH (ref 80.0–100.0)
Platelets: 83 10*3/uL — ABNORMAL LOW (ref 150–400)
RBC: 2.74 MIL/uL — ABNORMAL LOW (ref 3.87–5.11)
RDW: 18.7 % — ABNORMAL HIGH (ref 11.5–15.5)
WBC: 4 10*3/uL (ref 4.0–10.5)
nRBC: 0 % (ref 0.0–0.2)

## 2018-12-15 NOTE — Progress Notes (Addendum)
  [facility] Progress Note   Subjective:  Seen in room. Drowsy, no complaints.  Normal f'gram yesterday For HD today   Objective Vitals:   12/14/18 2037 12/15/18 0049 12/15/18 0257 12/15/18 0903  BP:  121/75 126/76 118/90  Pulse:  80 85 92  Resp:  16 16 17   Temp:  98.4 F (36.9 C) 98.5 F (36.9 C) 99.1 F (37.3 C)  TempSrc:  Oral Oral Oral  SpO2:  100% 100% 100%  Weight: 58 kg     Height: 5' 5.5" (1.664 m)       Physical Exam General: Elderly female NAD  Heart: RRR III/VI systolic murmur  Lungs: CTAB  Abdomen: soft NTND  Extremities: No LE edema  Dialysis Access: LA AVG +bruit    Weight change: -0.6 kg   Additional Objective Labs: Basic Metabolic Panel: Recent Labs  Lab 12/13/18 0347 12/13/18 1706 12/14/18 0447  NA 140 142 137  K 4.0 3.8 3.8  CL 101 102 104  CO2 25 30 23   GLUCOSE 112* 87 90  BUN 68* 70* 26*  CREATININE 7.43* 7.71* 4.19*  CALCIUM 8.7* 8.6* 8.6*  PHOS  --  6.0*  --    CBC: Recent Labs  Lab 12/13/18 0347 12/13/18 1559 12/13/18 1706 12/14/18 0447  WBC 2.0* 2.7* 2.4* 2.8*  NEUTROABS 1.3*  --   --   --   HGB 6.6* 8.3* 7.9* 8.3*  HCT 22.3* 26.4* 25.4* 26.4*  MCV 104.2* 98.1 98.8 100.4*  PLT 66* 71* 70* 72*   Blood Culture    Component Value Date/Time   SDES URINE, CLEAN CATCH 11/23/2018 1721   SPECREQUEST  11/23/2018 1721    NONE Performed at [facility], [place] [address] [place], [place] [address]    CULT <10,000 COLONIES/mL INSIGNIFICANT GROWTH (A) 11/23/2018 1721   REPTSTATUS 11/24/2018 FINAL 11/23/2018 1721     Medications:  . sodium chloride   Intravenous Once  . docusate sodium  100 mg Oral BID  . metoprolol tartrate  10 mg Intravenous Once  . sevelamer carbonate  1,600 mg Oral TID WC  . sodium chloride flush  3 mL Intravenous Q12H    Dialysis Orders:  [facility] MWF 3.5h EDW 58 kg  [place] 3k,2.25ca   Heparin NONE.   Access LUA AVGG    Hectorol  4 mcg IV TIW  Mirecera  225 mcg IV q 2 weeks  (last  given 12/10/18)   Venofer  50mg   IV q week   Assessment/Plan: 1. Bleeding AVG/symptomatic anemia  - IR fistulogram 2/14 -no stenosis  2. ESRD - MWF. HD off schedule today missed yesterday d/t procedure  3. HTN/volume- No BP meds. HD yesterday minimal UF. For HD today UF 2L  4. Anemia-  Hgb 6.6 on admission. (? 2/2 bleeding AVG) S/p 1u prbcs 2/14 > Now 8.4. On max ESA as outpatient. Last dose 2/10. Continue weekly Fe  5. Metabolic Bone Disease- Continue VDRA/binders  6. Dementia. Appears at baseline status  7. Nutrition - Renal diet/vitamins. Prot supp as needed  Dispo: From renal standpoint Stable for discharge after HD today   Lynnda Child PA-C [place] Pager [phone] 12/15/2018,11:04 AM  LOS: 1 day   Pt seen, examined and agree w A/P as above.  [facility] 12/15/2018, 12:54 PM

## 2018-12-15 NOTE — Progress Notes (Signed)
NSR with hearate of 100 per min upon discharged.No chest discomfort.Fistula worked well on her hemodialysis treatment for four hours.No active bleeding at the fistula site.

## 2018-12-15 NOTE — Progress Notes (Signed)
Patient just arrived from hemodialysis as of this time.She normal sinus on the telemetry @94 /min.Patient is not complaining of any chest discomfort when she was in the hemodialysis as per CCMD reported that she was converted to SVT.Called CCMD ,and confirmed that she is NSR @97per  min.Will monitor for a while,if continue tobe NRS will discharged patient as per order.  c

## 2018-12-15 NOTE — Progress Notes (Signed)
M.D made aware about the SVT episode she had during hemodialysis,informed M.D. that patient has been on NSR and ST with heart rang of 98-110/min. Patient is asymptomatic.Per M.D. ,o.k to d/c patient.

## 2018-12-15 NOTE — Discharge Summary (Signed)
Physician Discharge Summary  [name] [name] WFU:[id number] DOB: 1941/06/16 DOA: 12/13/2018  PCP: Ann Held, DO  Admit date: 12/13/2018 Discharge date: 12/15/2018  Admitted From: home Disposition:  home  Recommendations for Outpatient Follow-up:  1. Follow up with PCP in 1-2 weeks 2. Please obtain BMP/CBC in one week 3. Please follow up on the following pending results:  Home Health:no  Equipment/Devices:none  Discharge Condition:stable  CODE STATUS:full  Diet recommendation: renal  Brief/Interim Summary: Brief history 78 y.o. female with medical history significant ofESRD on MWF HD; HTN; HLD; and DM presented with shortness of breath.  Patient went to her dialysis and her fistula started bleeding, they wanted t osend her to hospital but it stopped bleeding, EMS  suggested that she did not need to go.  Her shortness of breath also improved spontaneously.  In the ER her hemoglobin had dropped from baseline by 2 g ER felt that patient was short of breath due to anemia 1 unit of PRBC was transfused and it was admitted.  Patient underwent IR fistulogram 2/14, per report access was amenable for future percutaneous access.  Patient was monitored In the hospital.  Assessment/Plan  AV graft malfunction with bleeding in AVF in RUE. Status post fistulogram.  Access is amenable for future percutaneous access.  ESRD on HD: Cont HD MWF.Nephrology on board. For HD today. Discussed w nephrology.  Acute blood loss anemia due to bleeding from the mal function: Hemoglobin improved at 8.3, status post smidgen for hemoglobin 6.6 g.  Diabetes mellitus diet controlled.  Monitor Accu-Chek.  Essential hypertension blood pressure currently stable, not taking any medication.  Chronic diastolic CHF, compensated.  Memory loss, family is aware, continue to follow-up with outpatient, encourage goals of care.  If patient does well after dialysis will plan to discharge home. Even-though patient  was admitted as inpatient she is being discharged today as she improved well with intervention - blood transfusion, IR eval of fistula and no more bleeding and is being discharged home.  Discharge Diagnoses:  Principal Problem:   AV graft malfunction (HCC) Active Problems:   Type II diabetes mellitus with nephropathy (HCC)   Essential hypertension   ESRD (end stage renal disease) on dialysis (HCC)   Chronic diastolic heart failure (HCC)   Anemia, chronic disease   Memory loss   Acute respiratory failure with hypoxia (HCC)   AV graft malfunction, initial encounter [facility])    Discharge Instructions  Discharge Instructions    Call MD for:   Complete by:  As directed    Recurrent bleeding   Call MD for:  persistant nausea and vomiting   Complete by:  As directed    Call MD for:  temperature >100.4   Complete by:  As directed    Diet - low sodium heart healthy   Complete by:  As directed    renal   Increase activity slowly   Complete by:  As directed      Allergies as of 12/15/2018      Reactions   Hydralazine Itching   Robaxin [methocarbamol] Other (See Comments)   Dizziness       Medication List    TAKE these medications   aspirin EC 81 MG tablet Take 81 mg by mouth daily.   carvedilol 25 MG tablet Commonly known as:  COREG Take 25 mg by mouth daily.   folic acid-vitamin b complex-vitamin c-selenium-zinc 3 MG Tabs tablet Take 1 tablet by mouth daily.   furosemide 40 MG  tablet Commonly known as:  LASIX Take 40 mg by mouth daily.   HYDROcodone-acetaminophen 5-325 MG tablet Commonly known as:  NORCO/VICODIN Take 1 tablet by mouth every 6 (six) hours as needed. What changed:  reasons to take this   isosorbide mononitrate 30 MG 24 hr tablet Commonly known as:  IMDUR Take 30 mg by mouth daily.   multivitamin with minerals Tabs tablet Take 1 tablet by mouth daily.   nitroGLYCERIN 0.4 MG SL tablet Commonly known as:  NITROSTAT Place 1 tablet (0.4 mg total)  under the tongue every 5 (five) minutes as needed for chest pain.   sevelamer carbonate 800 MG tablet Commonly known as:  RENVELA Take 2 tablets (1,600 mg total) by mouth 3 (three) times daily with meals.       Allergies  Allergen Reactions  . Hydralazine Itching  . Robaxin [Methocarbamol] Other (See Comments)    Dizziness     Consultations:  NEPHRO   Procedures/Studies: Dg Chest 2 View  Result Date: 12/13/2018 CLINICAL DATA:  Shortness of breath and vomiting EXAM: CHEST - 2 VIEW COMPARISON:  12/08/2018 FINDINGS: Chronic cardiomegaly. Vascular stent in the left arm. There is no edema, consolidation, effusion, or pneumothorax. IMPRESSION: Stable from prior.  No evidence of acute disease. Cardiomegaly. Electronically Signed   By: Monte Fantasia M.D.   On: 12/13/2018 04:39   Dg Chest 2 View  Result Date: 12/08/2018 CLINICAL DATA:  Patient status post fall. EXAM: CHEST - 2 VIEW COMPARISON:  Chest radiograph 11/23/2018 FINDINGS: Monitoring leads overlie the patient. Stable cardiomegaly. Aortic atherosclerosis. Bibasilar atelectasis. No large area pulmonary consolidation. No pleural effusion or pneumothorax. Thoracic spine degenerative changes. Cholecystectomy clips. IMPRESSION: No acute cardiopulmonary process. Cardiomegaly. Electronically Signed   By: Lovey Newcomer M.D.   On: 12/08/2018 13:13   Dg Chest 2 View  Result Date: 11/23/2018 CLINICAL DATA:  Chronic weakness. EXAM: CHEST - 2 VIEW COMPARISON:  Single-view of the chest 11/03/2018. PA and lateral chest 10/14/2018. FINDINGS: There is cardiomegaly without edema. Aortic atherosclerosis noted. Trace right pleural effusion is seen. No consolidative process or pneumothorax. No acute or focal bony abnormality. IMPRESSION: Trace right pleural effusion. Cardiomegaly without edema. Atherosclerosis. Electronically Signed   By: Inge Rise M.D.   On: 11/23/2018 16:28   Dg Knee Complete 4 Views Left  Result Date: 12/08/2018 CLINICAL DATA:   Patient status post fall hitting the knee. Initial encounter. EXAM: LEFT KNEE - COMPLETE 4+ VIEW COMPARISON:  None. FINDINGS: Normal anatomic alignment. Chondrocalcinosis. Medial, lateral and patellofemoral compartment degenerative changes. No joint effusion. Extensive vascular calcifications. Transverse lucency demonstrated through the patella. IMPRESSION: There is a transverse lucency demonstrated through the patella concerning for nondisplaced patellar fracture. Electronically Signed   By: Lovey Newcomer M.D.   On: 12/08/2018 13:12   Dg Knee Complete 4 Views Right  Result Date: 12/08/2018 CLINICAL DATA:  78 year old female with bilateral knee pain after tripping and falling last night. EXAM: RIGHT KNEE - COMPLETE 4+ VIEW COMPARISON:  Concurrently obtained radiographs of the contralateral knee FINDINGS: No evidence of acute fracture or malalignment. No knee joint effusion. Normal bony mineralization without lytic or blastic osseous lesion. Chondrocalcinosis is present in the medial and lateral compartments. Mild enthesopathy at the superior pole of the patella. Extensive atherosclerotic vascular calcifications throughout the visualized arterial tree. IMPRESSION: 1. No evidence of acute fracture, malalignment or knee joint effusion. 2. Chondrocalcinosis. 3. Extensive atherosclerotic vascular calcifications. Electronically Signed   By: Jacqulynn Cadet M.D.   On: 12/08/2018 13:13  Ir Dialy Shunt Intro Needle/intracath Initial W/img Left  Result Date: 12/14/2018 INDICATION: 78 year old female with possible dysfunction of left upper extremity fistula EXAM: FISTULAGRAM MEDICATIONS: None. ANESTHESIA/SEDATION: None FLUOROSCOPY TIME:  Fluoroscopy Time: 0 minutes 18 seconds (0.2 mGy). COMPLICATIONS: None PROCEDURE: Informed written consent was obtained from the patient after a thorough discussion of the procedural risks, benefits and alternatives. All questions were addressed. Maximal Sterile Barrier Technique was  utilized including caps, mask, sterile gowns, sterile gloves, sterile drape, hand hygiene and skin antiseptic. A timeout was performed prior to the initiation of the procedure. The patient's left upper extremity was prepped and draped in the usual sterile fashion. Angiocath was placed into the left upper extremity fistula. Complete fistulagram images were performed including reflux images at the AV connection. No stenosis identified at the venous outflow or at the AV connection. No intervention was performed at this time. Excellent thrill was confirmed before and after the procedure. IMPRESSION: Left upper extremity fistulagram demonstrating no stenosis of the venous outflow or of the AV connection. Signed, Dulcy Fanny. Dellia Nims, RPVI [facility] Radiology ACCESS: This access remains amenable to future percutaneous interventions as clinically indicated. Electronically Signed   By: Corrie Mckusick D.O.   On: 12/14/2018 16:20   (Echo, Carotid, EGD, Colonoscopy, ERCP)    Subjective: RESTING WELL. DAUGHTER AT BEDSIDE. No new complaints.  Discharge Exam: Vitals:   12/15/18 0257 12/15/18 0903  BP: 126/76 118/90  Pulse: 85 92  Resp: 16 17  Temp: 98.5 F (36.9 C) 99.1 F (37.3 C)  SpO2: 100% 100%   Vitals:   12/14/18 2037 12/15/18 0049 12/15/18 0257 12/15/18 0903  BP:  121/75 126/76 118/90  Pulse:  80 85 92  Resp:  16 16 17   Temp:  98.4 F (36.9 C) 98.5 F (36.9 C) 99.1 F (37.3 C)  TempSrc:  Oral Oral Oral  SpO2:  100% 100% 100%  Weight: 58 kg     Height: 5' 5.5" (1.664 m)       General: Pt is alert, awake, not in acute distress Cardiovascular: RRR, S1/S2 +, no rubs, no gallops Respiratory: CTA bilaterally, no wheezing, no rhonchi Abdominal: Soft, NT, ND, bowel sounds + Extremities: no edema, no cyanosis   The results of significant diagnostics from this hospitalization (including imaging, microbiology, ancillary and laboratory) are  listed below for reference.     Microbiology: No results found for this or any previous visit (from the past 240 hour(s)).   Labs: BNP (last 3 results) No results for input(s): BNP in the last 8760 hours. Basic Metabolic Panel: Recent Labs  Lab 12/13/18 0347 12/13/18 1706 12/14/18 0447 12/15/18 1050  NA 140 142 137 137  K 4.0 3.8 3.8 4.0  CL 101 102 104 104  CO2 25 30 23 24   GLUCOSE 112* 87 90 91  BUN 68* 70* 26* 36*  CREATININE 7.43* 7.71* 4.19* 5.91*  CALCIUM 8.7* 8.6* 8.6* 9.0  PHOS  --  6.0*  --   --    Liver Function Tests: Recent Labs  Lab 12/13/18 1706  ALBUMIN 2.7*   No results for input(s): LIPASE, AMYLASE in the last 168 hours. No results for input(s): AMMONIA in the last 168 hours. CBC: Recent Labs  Lab 12/13/18 0347 12/13/18 1559 12/13/18 1706 12/14/18 0447 12/15/18 1050  WBC 2.0* 2.7* 2.4* 2.8* 4.0  NEUTROABS 1.3*  --   --   --   --   HGB 6.6* 8.3* 7.9* 8.3* 8.4*  HCT 22.3* 26.4* 25.4* 26.4* 27.8*  MCV 104.2* 98.1 98.8 100.4* 101.5*  PLT 66* 71* 70* 72* 83*   Cardiac Enzymes: No results for input(s): CKTOTAL, CKMB, CKMBINDEX, TROPONINI in the last 168 hours. BNP: Invalid input(s): POCBNP CBG: Recent Labs  Lab 12/14/18 1239  GLUCAP 79   D-Dimer No results for input(s): DDIMER in the last 72 hours. Hgb A1c No results for input(s): HGBA1C in the last 72 hours. Lipid Profile No results for input(s): CHOL, HDL, LDLCALC, TRIG, CHOLHDL, LDLDIRECT in the last 72 hours. Thyroid function studies No results for input(s): TSH, T4TOTAL, T3FREE, THYROIDAB in the last 72 hours.  Invalid input(s): FREET3 Anemia work up No results for input(s): VITAMINB12, FOLATE, FERRITIN, TIBC, IRON, RETICCTPCT in the last 72 hours. Urinalysis    Component Value Date/Time   COLORURINE YELLOW 11/23/2018 1710   APPEARANCEUR CLEAR 11/23/2018 1710   LABSPEC 1.016 11/23/2018 1710   PHURINE 6.0 11/23/2018 1710   GLUCOSEU NEGATIVE 11/23/2018 1710   HGBUR SMALL (A)  11/23/2018 1710   BILIRUBINUR NEGATIVE 11/23/2018 1710   BILIRUBINUR neg 08/21/2018 1145   KETONESUR NEGATIVE 11/23/2018 1710   PROTEINUR 100 (A) 11/23/2018 1710   UROBILINOGEN 0.2 08/21/2018 1145   UROBILINOGEN 2.0 (H) 04/01/2008 1751   NITRITE NEGATIVE 11/23/2018 1710   LEUKOCYTESUR SMALL (A) 11/23/2018 1710   Sepsis Labs Invalid input(s): PROCALCITONIN,  WBC,  LACTICIDVEN Microbiology No results found for this or any previous visit (from the past 240 hour(s)).   Time coordinating discharge: 25 minutes  SIGNED:   Antonieta Pert, MD  [facility] 12/15/2018, 2:29 PM  If 7PM-7AM, please contact night-coverage [email or web address]

## 2018-12-17 ENCOUNTER — Telehealth: Payer: Self-pay | Admitting: *Deleted

## 2018-12-17 DIAGNOSIS — N2581 Secondary hyperparathyroidism of renal origin: Secondary | ICD-10-CM | POA: Diagnosis not present

## 2018-12-17 DIAGNOSIS — N186 End stage renal disease: Secondary | ICD-10-CM | POA: Diagnosis not present

## 2018-12-17 NOTE — Telephone Encounter (Signed)
Called pt for TCM/ hospital follow up. Husband states wife is doing okay, but currently at dialysis. Agrees to have wife call to schedule appt.

## 2018-12-18 DIAGNOSIS — I5032 Chronic diastolic (congestive) heart failure: Secondary | ICD-10-CM | POA: Diagnosis not present

## 2018-12-18 DIAGNOSIS — I132 Hypertensive heart and chronic kidney disease with heart failure and with stage 5 chronic kidney disease, or end stage renal disease: Secondary | ICD-10-CM | POA: Diagnosis not present

## 2018-12-18 DIAGNOSIS — Z008 Encounter for other general examination: Secondary | ICD-10-CM | POA: Diagnosis not present

## 2018-12-18 DIAGNOSIS — Z0000 Encounter for general adult medical examination without abnormal findings: Secondary | ICD-10-CM | POA: Diagnosis not present

## 2018-12-18 DIAGNOSIS — Z992 Dependence on renal dialysis: Secondary | ICD-10-CM | POA: Diagnosis not present

## 2018-12-18 DIAGNOSIS — E1122 Type 2 diabetes mellitus with diabetic chronic kidney disease: Secondary | ICD-10-CM | POA: Diagnosis not present

## 2018-12-18 DIAGNOSIS — D649 Anemia, unspecified: Secondary | ICD-10-CM | POA: Diagnosis not present

## 2018-12-18 DIAGNOSIS — N186 End stage renal disease: Secondary | ICD-10-CM | POA: Diagnosis not present

## 2018-12-19 DIAGNOSIS — N2581 Secondary hyperparathyroidism of renal origin: Secondary | ICD-10-CM | POA: Diagnosis not present

## 2018-12-19 DIAGNOSIS — N186 End stage renal disease: Secondary | ICD-10-CM | POA: Diagnosis not present

## 2018-12-22 DIAGNOSIS — N2581 Secondary hyperparathyroidism of renal origin: Secondary | ICD-10-CM | POA: Diagnosis not present

## 2018-12-22 DIAGNOSIS — N186 End stage renal disease: Secondary | ICD-10-CM | POA: Diagnosis not present

## 2018-12-24 DIAGNOSIS — N186 End stage renal disease: Secondary | ICD-10-CM | POA: Diagnosis not present

## 2018-12-24 DIAGNOSIS — N2581 Secondary hyperparathyroidism of renal origin: Secondary | ICD-10-CM | POA: Diagnosis not present

## 2018-12-25 ENCOUNTER — Ambulatory Visit: Payer: Medicare HMO | Admitting: Physician Assistant

## 2018-12-25 NOTE — Progress Notes (Deleted)
Cardiology Office Note    Date:  12/25/2018   ID:  EMRIE GAYLE, DOB 04-Jun-1941, MRN [id number]  PCP:  Carollee Herter, Alferd Apa, DO  Cardiologist: Dr. Johnsie Cancel   Chief Complaint: Hospital follow up   History of Present Illness:   [name] [name] is a 78 y.o. female with a hx of DM, HTN, HLD, ESRD on HD(MWF), chronic diastolic CHF and aortic stenosispresents for follow up. .  Seen 01/17/17 to establish cardiac care and syncope during dialysis. She was euvolemic. Recommended stress test givencardiac risk factors of HTN, ESRD and DM.however never followed up.   Seen 10/2017 when admitted for SOB and chest pain. EKG with inferior lateral ST depression. Low risk stress test. Echo showed normal LVEF at 55-60%. Sclerosis aortic valve without stenosis. No further work up.   She had palpitations during dialysis 03/30/18 lasting for few hours.  She was sent to ER for further evaluation.  EKG in system shows sinus rhythm at rate of 89 with PAC.  No tachycardia noted.  She left home from triage without seen by provider.    Admitted 2/20 for fistula malfunction with bleeding. Given 1 unit or PRBCS for anemia. Per note, SVT during dialysis.   Here today for follow up.   Past Medical History:  Diagnosis Date  . DEPRESSION   . DIABETES MELLITUS, TYPE I   . ESRD (end stage renal disease) on dialysis ([place])    MWF HD  . GERD   . GLAUCOMA   . Headache(784.0)   . Hepatitis B carrier ([place])    05/2009: neg Hep C; Hep B: core pos, Surf neg; fatty liver US 8/10 - 7/13  . HYPERLIPIDEMIA   . HYPERTENSION   . OSTEOPENIA   . POSTMENOPAUSAL STATUS   . Pulmonary edema   . Unspecified vitamin D deficiency     Past Surgical History:  Procedure Laterality Date  . A/V SHUNTOGRAM N/A 09/06/2018   Procedure: A/V SHUNTOGRAM - Left AV;  Surgeon: Marty Heck, MD;  Location: [facility];  Service: Cardiovascular;  Laterality: N/A;  . AV FISTULA PLACEMENT Left 05/29/2014   Procedure:  INSERTION OF ARTERIOVENOUS (AV) GORE-TEX GRAFT ARM-LEFT;  Surgeon: Angelia Mould, MD;  Location: [facility];  Service: Vascular;  Laterality: Left;  . CHOLECYSTECTOMY  02/12/07  . INSERTION OF DIALYSIS CATHETER Right 05/27/2014   Procedure: INSERTION OF DIALYSIS CATHETER;  Surgeon: Angelia Mould, MD;  Location: [facility];  Service: Vascular;  Laterality: Right;  . IR DIALY SHUNT INTRO [place] W/IMG LEFT Left 12/14/2018  . PERIPHERAL VASCULAR BALLOON ANGIOPLASTY Left 09/06/2018   Procedure: PERIPHERAL VASCULAR BALLOON ANGIOPLASTY;  Surgeon: Marty Heck, MD;  Location: [facility];  Service: Cardiovascular;  Laterality: Left;  arm shunt  . REFRACTIVE SURGERY  07/29/09   Dr. Bing Plume  . TONSILLECTOMY      Current Medications: Prior to Admission medications   Medication Sig Start Date End Date Taking? Authorizing Provider  aspirin EC 81 MG tablet Take 81 mg by mouth daily.    [provider]  carvedilol (COREG) 25 MG tablet Take 25 mg by mouth daily.    [provider]  folic acid-vitamin b complex-vitamin c-selenium-zinc (DIALYVITE) 3 MG TABS tablet Take 1 tablet by mouth daily.    [provider]  furosemide (LASIX) 40 MG tablet Take 40 mg by mouth daily.    [provider]  HYDROcodone-acetaminophen (NORCO/VICODIN) 5-325 MG tablet Take 1 tablet by mouth  every 6 (six) hours as needed. Patient taking differently: Take 1 tablet by mouth every 6 (six) hours as needed for moderate pain.  12/08/18   Drenda Freeze, MD  isosorbide mononitrate (IMDUR) 30 MG 24 hr tablet Take 30 mg by mouth daily.    [provider]  Multiple Vitamin (MULTIVITAMIN WITH MINERALS) TABS tablet Take 1 tablet by mouth daily. 10/19/18   Kayleen Memos, DO  nitroGLYCERIN (NITROSTAT) 0.4 MG SL tablet Place 1 tablet (0.4 mg total) under the tongue every 5 (five) minutes as needed for chest pain. 05/03/93   Delora Fuel, MD  sevelamer carbonate  (RENVELA) 800 MG tablet Take 2 tablets (1,600 mg total) by mouth 3 (three) times daily with meals. Patient not taking: Reported on 12/14/2018 10/18/18   Kayleen Memos, DO    Allergies:   Hydralazine and Robaxin [methocarbamol]   Social History   Socioeconomic History  . Marital status: Married    Spouse name: Not on file  . Number of children: Not on file  . Years of education: Not on file  . Highest education level: Not on file  Occupational History  . Not on file  Social Needs  . Financial resource strain: Not on file  . Food insecurity:    Worry: Not on file    Inability: Not on file  . Transportation needs:    Medical: Not on file    Non-medical: Not on file  Tobacco Use  . Smoking status: Never Smoker  . Smokeless tobacco: Never Used  . Tobacco comment: Married x's 46yrs, 6 kids-scattered [employer]. Retired Consulting civil engineer  Substance and Sexual Activity  . Alcohol use: No  . Drug use: No  . Sexual activity: Not Currently    Birth control/protection: None  Lifestyle  . Physical activity:    Days per week: Not on file    Minutes per session: Not on file  . Stress: Not on file  Relationships  . Social connections:    Talks on phone: Not on file    Gets together: Not on file    Attends religious service: Not on file    Active member of club or organization: Not on file    Attends meetings of clubs or organizations: Not on file    Relationship status: Not on file  Other Topics Concern  . Not on file  Social History Narrative  . Not on file     Family History:  The patient's family history includes Arthritis in an other family member; Coronary artery disease in an other family member; Diabetes in an other family member; Hypertension in an other family member; Kidney disease in her mother and sister. ***  ROS:   Please see the history of present illness.    ROS All other systems reviewed and are negative.   PHYSICAL EXAM:   VS:  There were no vitals taken for  this visit.   GEN: Well nourished, well developed, in no acute distress  HEENT: normal  Neck: no JVD, carotid bruits, or masses Cardiac: ***RRR; no murmurs, rubs, or gallops,no edema  Respiratory:  clear to auscultation bilaterally, normal work of breathing GI: soft, nontender, nondistended, + BS MS: no deformity or atrophy  Skin: warm and dry, no rash Neuro:  Alert and Oriented x 3, Strength and sensation are intact Psych: euthymic mood, full affect  Wt Readings from Last 3 Encounters:  12/15/18 123 lb 3.8 oz (55.9 kg)  11/03/18 114 lb 3.2 oz (51.8 kg)  10/17/18 114 lb 3.2 oz (51.8 kg)      Studies/Labs Reviewed:   EKG:  EKG is ordered today.  The ekg ordered today demonstrates ***  Recent Labs: 11/23/2018: ALT 20; Magnesium 1.9; TSH 1.158 12/15/2018: BUN 36; Creatinine, Ser 5.91; Hemoglobin 8.4; Platelets 83; Potassium 4.0; Sodium 137   Lipid Panel    Component Value Date/Time   CHOL 193 08/21/2018 1151   TRIG 92.0 08/21/2018 1151   HDL 62.60 08/21/2018 1151   CHOLHDL 3 08/21/2018 1151   VLDL 18.4 08/21/2018 1151   LDLCALC 112 (H) 08/21/2018 1151   LDLDIRECT 196.2 04/26/2013 1152    Additional studies/ records that were reviewed today include:   Echocardiogram: 11/09/17 Study Conclusions  - Left ventricle: The cavity size was mildly dilated. Wall   thickness was increased in a pattern of moderate LVH. Systolic   function was normal. The estimated ejection fraction was in the   range of 55% to 60%. - Mitral valve: Calcified annulus. Mildly thickened leaflets . - Left atrium: The atrium was moderately dilated. - Atrial septum: No defect or patent foramen ovale was identified.     ASSESSMENT & PLAN:    1. ***    Medication Adjustments/Labs and Tests Ordered: Current medicines are reviewed at length with the patient today.  Concerns regarding medicines are outlined above.  Medication changes, Labs and Tests ordered today are listed in the Patient  Instructions below. There are no Patient Instructions on file for this visit.   Mahalia Longest [place], [place]  12/25/2018 8:12 AM    [address], [place], [place]  [address] Phone: [phone]; Fax: [phone]

## 2018-12-26 DIAGNOSIS — N186 End stage renal disease: Secondary | ICD-10-CM | POA: Diagnosis not present

## 2018-12-26 DIAGNOSIS — N2581 Secondary hyperparathyroidism of renal origin: Secondary | ICD-10-CM | POA: Diagnosis not present

## 2018-12-27 ENCOUNTER — Encounter (HOSPITAL_COMMUNITY): Payer: Self-pay | Admitting: Emergency Medicine

## 2018-12-27 ENCOUNTER — Emergency Department (HOSPITAL_COMMUNITY): Payer: Medicare HMO

## 2018-12-27 ENCOUNTER — Emergency Department (HOSPITAL_COMMUNITY)
Admission: EM | Admit: 2018-12-27 | Discharge: 2018-12-27 | Disposition: A | Discharge status: Home / Self Care | Payer: Medicare HMO | Attending: Emergency Medicine | Admitting: Emergency Medicine

## 2018-12-27 DIAGNOSIS — W050XXA Fall from non-moving wheelchair, initial encounter: Secondary | ICD-10-CM | POA: Diagnosis not present

## 2018-12-27 DIAGNOSIS — Z9049 Acquired absence of other specified parts of digestive tract: Secondary | ICD-10-CM | POA: Insufficient documentation

## 2018-12-27 DIAGNOSIS — M25551 Pain in right hip: Secondary | ICD-10-CM | POA: Diagnosis not present

## 2018-12-27 DIAGNOSIS — I132 Hypertensive heart and chronic kidney disease with heart failure and with stage 5 chronic kidney disease, or end stage renal disease: Secondary | ICD-10-CM | POA: Diagnosis not present

## 2018-12-27 DIAGNOSIS — I252 Old myocardial infarction: Secondary | ICD-10-CM | POA: Diagnosis not present

## 2018-12-27 DIAGNOSIS — R52 Pain, unspecified: Secondary | ICD-10-CM | POA: Diagnosis not present

## 2018-12-27 DIAGNOSIS — W19XXXA Unspecified fall, initial encounter: Secondary | ICD-10-CM

## 2018-12-27 DIAGNOSIS — M545 Low back pain: Secondary | ICD-10-CM | POA: Insufficient documentation

## 2018-12-27 DIAGNOSIS — N186 End stage renal disease: Secondary | ICD-10-CM | POA: Insufficient documentation

## 2018-12-27 DIAGNOSIS — I2510 Atherosclerotic heart disease of native coronary artery without angina pectoris: Secondary | ICD-10-CM | POA: Diagnosis not present

## 2018-12-27 DIAGNOSIS — Z79899 Other long term (current) drug therapy: Secondary | ICD-10-CM | POA: Diagnosis not present

## 2018-12-27 DIAGNOSIS — S3992XA Unspecified injury of lower back, initial encounter: Secondary | ICD-10-CM | POA: Diagnosis not present

## 2018-12-27 DIAGNOSIS — E1122 Type 2 diabetes mellitus with diabetic chronic kidney disease: Secondary | ICD-10-CM | POA: Diagnosis not present

## 2018-12-27 DIAGNOSIS — I5032 Chronic diastolic (congestive) heart failure: Secondary | ICD-10-CM | POA: Insufficient documentation

## 2018-12-27 DIAGNOSIS — I959 Hypotension, unspecified: Secondary | ICD-10-CM | POA: Diagnosis not present

## 2018-12-27 DIAGNOSIS — Z7982 Long term (current) use of aspirin: Secondary | ICD-10-CM | POA: Insufficient documentation

## 2018-12-27 DIAGNOSIS — S79911A Unspecified injury of right hip, initial encounter: Secondary | ICD-10-CM | POA: Diagnosis not present

## 2018-12-27 DIAGNOSIS — Z992 Dependence on renal dialysis: Secondary | ICD-10-CM | POA: Diagnosis not present

## 2018-12-27 DIAGNOSIS — F329 Major depressive disorder, single episode, unspecified: Secondary | ICD-10-CM | POA: Insufficient documentation

## 2018-12-27 DIAGNOSIS — M5489 Other dorsalgia: Secondary | ICD-10-CM | POA: Diagnosis not present

## 2018-12-27 DIAGNOSIS — R1111 Vomiting without nausea: Secondary | ICD-10-CM | POA: Diagnosis not present

## 2018-12-27 DIAGNOSIS — M25519 Pain in unspecified shoulder: Secondary | ICD-10-CM | POA: Diagnosis not present

## 2018-12-27 MED ORDER — ACETAMINOPHEN 500 MG PO TABS
1000.0000 mg | ORAL_TABLET | Freq: Once | ORAL | Status: AC
  Administered 2018-12-27: 1000 mg via ORAL
  Filled 2018-12-27: qty 2

## 2018-12-27 NOTE — ED Notes (Signed)
Pt refused to change to gown.

## 2018-12-27 NOTE — ED Provider Notes (Signed)
[facility] EMERGENCY DEPARTMENT Provider Note   CSN: [id number] Arrival date & time: 12/27/18  1631    History   Chief Complaint Chief Complaint  Patient presents with  . Fall    HPI [name] [name] is a 78 y.o. female.     78 year old female with prior medical history as detailed below presents for evaluation following reported fall.  Patient reports that she lost her balance while ambulating with her family.  She was using her walker.  As she fell she landed onto her right hip.  She complains of pain to the right hip and low back.  She has been ambulatory since the fall.  She denies head injury.  She denies neck pain.  She has not taken anything for symptoms prior to arrival.  The history is provided by the patient and medical records.  Fall  This is a new problem. The current episode started 3 to 5 hours ago. The problem occurs rarely. The problem has not changed since onset.Pertinent negatives include no chest pain, no abdominal pain, no headaches and no shortness of breath. Nothing aggravates the symptoms. Nothing relieves the symptoms.    Past Medical History:  Diagnosis Date  . DEPRESSION   . DIABETES MELLITUS, TYPE I   . ESRD (end stage renal disease) on dialysis ([place])    MWF HD  . GERD   . GLAUCOMA   . Headache(784.0)   . Hepatitis B carrier ([place])    05/2009: neg Hep C; Hep B: core pos, Surf neg; fatty liver US 8/10 - 7/13  . HYPERLIPIDEMIA   . HYPERTENSION   . OSTEOPENIA   . POSTMENOPAUSAL STATUS   . Pulmonary edema   . Unspecified vitamin D deficiency     Patient Active Problem List   Diagnosis Date Noted  . AV graft malfunction, initial encounter ([facility] [facility]) 12/14/2018  . Acute respiratory failure with hypoxia ([facility]) 12/13/2018  . AV graft malfunction ([facility]) 09/03/2018  . Urinary tract infection without hematuria 08/21/2018  . Memory loss 08/21/2018  . Anemia, chronic disease 03/01/2018  . Vasovagal syncope 03/01/2018  . Macrocytic anemia  01/28/2018  . Orthostatic syncope 01/28/2018  . EKG, abnormal   . Acute metabolic encephalopathy 63/84/6659  . Influenza 11/16/2017  . Fever in adult 11/15/2017  . Demand ischemia ([place]) 11/10/2017  . Normocytic anemia 11/10/2017  . Aortic atherosclerosis ([place]) 11/10/2017  . Respiratory failure with hypoxia ([place]) 08/18/2017  . Volume overload 08/18/2017  . Dialysis patient, noncompliant ([facility]) 08/17/2017  . Thumb pain, left 12/22/2016  . Chronic diastolic heart failure ([place]) 11/25/2016  . ESRD (end stage renal disease) on dialysis ([facility]) 06/25/2014  . Malignant hypertension 05/17/2014  . Chest pain 05/17/2014  . Elevated troponin 05/17/2014  . Hypertensive emergency 05/17/2014  . NSTEMI (non-ST elevated myocardial infarction) ([facility]) 05/17/2014  . GERD 06/17/2009  . Hepatitis B carrier ([facility]) 06/17/2009  . DEPRESSION 05/20/2009  . Unspecified glaucoma 05/20/2009  . HEADACHE 05/20/2009  . CARDIAC MURMUR 05/20/2009  . OSTEOPENIA 02/15/2008  . POSTMENOPAUSAL STATUS 12/21/2007  . Hyperlipidemia LDL goal <70 09/21/2007  . Essential hypertension 09/13/2007  . Type II diabetes mellitus with nephropathy [facility])     Past Surgical History:  Procedure Laterality Date  . A/V SHUNTOGRAM N/A 09/06/2018   Procedure: A/V SHUNTOGRAM - Left AV;  Surgeon: Marty Heck, MD;  Location: [facility];  Service: Cardiovascular;  Laterality: N/A;  . AV FISTULA PLACEMENT Left 05/29/2014   Procedure: INSERTION OF ARTERIOVENOUS (AV)  GORE-TEX GRAFT ARM-LEFT;  Surgeon: Angelia Mould, MD;  Location: [facility];  Service: Vascular;  Laterality: Left;  . CHOLECYSTECTOMY  02/12/07  . INSERTION OF DIALYSIS CATHETER Right 05/27/2014   Procedure: INSERTION OF DIALYSIS CATHETER;  Surgeon: Angelia Mould, MD;  Location: [facility];  Service: Vascular;  Laterality: Right;  . IR DIALY SHUNT INTRO [place] W/IMG LEFT Left 12/14/2018  . PERIPHERAL VASCULAR BALLOON ANGIOPLASTY Left 09/06/2018    Procedure: PERIPHERAL VASCULAR BALLOON ANGIOPLASTY;  Surgeon: Marty Heck, MD;  Location: [facility];  Service: Cardiovascular;  Laterality: Left;  arm shunt  . REFRACTIVE SURGERY  07/29/09   Dr. Bing Plume  . TONSILLECTOMY       OB History   No obstetric history on file.      Home Medications    Prior to Admission medications   Medication Sig Start Date End Date Taking? Authorizing Provider  aspirin EC 81 MG tablet Take 81 mg by mouth daily.    [provider]  carvedilol (COREG) 25 MG tablet Take 25 mg by mouth daily.    [provider]  folic acid-vitamin b complex-vitamin c-selenium-zinc (DIALYVITE) 3 MG TABS tablet Take 1 tablet by mouth daily.    [provider]  furosemide (LASIX) 40 MG tablet Take 40 mg by mouth daily.    [provider]  HYDROcodone-acetaminophen (NORCO/VICODIN) 5-325 MG tablet Take 1 tablet by mouth every 6 (six) hours as needed. Patient taking differently: Take 1 tablet by mouth every 6 (six) hours as needed for moderate pain.  12/08/18   Drenda Freeze, MD  isosorbide mononitrate (IMDUR) 30 MG 24 hr tablet Take 30 mg by mouth daily.    [provider]  Multiple Vitamin (MULTIVITAMIN WITH MINERALS) TABS tablet Take 1 tablet by mouth daily. 10/19/18   Kayleen Memos, DO  nitroGLYCERIN (NITROSTAT) 0.4 MG SL tablet Place 1 tablet (0.4 mg total) under the tongue every 5 (five) minutes as needed for chest pain. 04/03/32   Delora Fuel, MD  sevelamer carbonate (RENVELA) 800 MG tablet Take 2 tablets (1,600 mg total) by mouth 3 (three) times daily with meals. Patient not taking: Reported on 12/14/2018 10/18/18   Kayleen Memos, DO    Family History Family History  Problem Relation Age of Onset  . Kidney disease Mother   . Coronary artery disease Other   . Diabetes Other   . Hypertension Other   . Arthritis Other   . Kidney disease Sister     Social History Social History   Tobacco Use  . Smoking  status: Never Smoker  . Smokeless tobacco: Never Used  . Tobacco comment: Married x's 70yrs, 6 kids-scattered [employer]. Retired Consulting civil engineer  Substance Use Topics  . Alcohol use: No  . Drug use: No     Allergies   Hydralazine and Robaxin [methocarbamol]   Review of Systems Review of Systems  Respiratory: Negative for shortness of breath.   Cardiovascular: Negative for chest pain.  Gastrointestinal: Negative for abdominal pain.  Neurological: Negative for headaches.  All other systems reviewed and are negative.    Physical Exam Updated Vital Signs BP 118/72   Pulse 71   Temp 98.1 F (36.7 C) (Oral)   Resp 16   SpO2 100%   Physical Exam Vitals signs and nursing note reviewed.  Constitutional:      General: She is not in acute distress.    Appearance: She is well-developed.  HENT:  Head: Normocephalic and atraumatic.  Eyes:     Conjunctiva/sclera: Conjunctivae normal.     Pupils: Pupils are equal, round, and reactive to light.  Neck:     Musculoskeletal: Normal range of motion and neck supple.  Cardiovascular:     Rate and Rhythm: Normal rate and regular rhythm.     Heart sounds: Normal heart sounds.  Pulmonary:     Effort: Pulmonary effort is normal. No respiratory distress.     Breath sounds: Normal breath sounds.  Abdominal:     General: There is no distension.     Palpations: Abdomen is soft.     Tenderness: There is no abdominal tenderness.  Musculoskeletal: Normal range of motion.        General: No deformity.     Comments: Tenderness noted to the right lateral lumbar spine and into the lateral aspect of the right hip.  No bony tenderness noted.  Full active range of motion of the right hip noted.  Patient is ambulatory without difficulty.  Skin:    General: Skin is warm and dry.  Neurological:     Mental Status: She is alert and oriented to person, place, and time.      ED Treatments / Results  Labs (all labs ordered are listed, but only  abnormal results are displayed) Labs Reviewed - No data to display  EKG None  Radiology Dg Lumbar Spine Complete  Result Date: 12/27/2018 CLINICAL DATA:  Right hip and low back pain after fall from wheelchair today. EXAM: LUMBAR SPINE - COMPLETE 4+ VIEW COMPARISON:  11/07/2017 FINDINGS: Diffuse bone demineralization. Five lumbar type vertebral bodies. Mild anterior subluxation of L4 on L5, unchanged since prior study. Diffuse degenerative changes with narrowed interspaces and endplate hypertrophic changes. No vertebral compression deformities. No focal bone lesion or bone destruction. Degenerative changes in the lumbar facet joints. Visualized sacrum appears intact. Extensive vascular calcifications in the abdomen and pelvis. Surgical clips in the right upper quadrant. IMPRESSION: Degenerative changes in the lumbar spine. No acute displaced fractures identified. Electronically Signed   By: Lucienne Capers M.D.   On: 12/27/2018 22:51   Dg Hip Unilat W Or Wo Pelvis 2-3 Views Right  Result Date: 12/27/2018 CLINICAL DATA:  Right hip pain after fall from wheelchair today. EXAM: DG HIP (WITH OR WITHOUT PELVIS) 2-3V RIGHT COMPARISON:  09/28/2018 FINDINGS: Diffuse bone demineralization. Degenerative changes in the lower lumbar spine and in both hips. No evidence of acute fracture or dislocation of the pelvis or right hip. No focal bone lesion or bone destruction. SI joints and symphysis pubis are not displaced. Extensive vascular calcifications. IMPRESSION: No acute bony abnormalities. Degenerative changes in the right hip. Extensive vascular atherosclerosis. Electronically Signed   By: Lucienne Capers M.D.   On: 12/27/2018 22:53    Procedures Procedures (including critical care time)  Medications Ordered in ED Medications  acetaminophen (TYLENOL) tablet 1,000 mg (1,000 mg Oral Given 12/27/18 2123)     Initial Impression / Assessment and Plan / ED Course  I have reviewed the triage vital signs and  the nursing notes.  Pertinent labs & imaging results that were available during my care of the patient were reviewed by me and considered in my medical decision making (see chart for details).        MDM  Screen complete  Patient is presenting for evaluation following reported fall.  Her fall appears to have been a misstep.  She does not appear to have significant traumatic injury on  exam.  Plain films obtained of the LS spine and right hip are without evidence of acute fracture.  Patient does feel improved following her ED visit.  She desires discharge home.  She has been given Tylenol which has improved her pain.  She declines stronger pain medication.    She does understand the need for close follow-up.  Strict return precautions given and understood.  Final Clinical Impressions(s) / ED Diagnoses   Final diagnoses:  Fall, initial encounter    ED Discharge Orders    None       Valarie Merino, MD 12/27/18 2326

## 2018-12-27 NOTE — ED Notes (Signed)
Patient transported to X-ray 

## 2018-12-27 NOTE — Discharge Instructions (Addendum)
Please return for any problem.  Follow-up with your regular care providers as instructed.  Take it easy at home over the next several days.  Use Tylenol for pain.

## 2018-12-27 NOTE — ED Notes (Signed)
All appropriate discharge materials reviewed with patient at length. Time for questions provided. Pt denies any further questions at this time. Verbalizes understanding of all provided materials.  

## 2018-12-27 NOTE — ED Triage Notes (Signed)
Pt arrives via EMS. Pt was at [facility], sitting on rollaider walker and pt's caretaker went over a bump and she fell off the wheelchair. No LOC, did not hit her head. Pt's complaining of back pain and neck pain. Pt arrives in C collar BP 117/78, HR 78, 100% on room air.

## 2018-12-29 DIAGNOSIS — Z992 Dependence on renal dialysis: Secondary | ICD-10-CM | POA: Diagnosis not present

## 2018-12-29 DIAGNOSIS — N186 End stage renal disease: Secondary | ICD-10-CM | POA: Diagnosis not present

## 2018-12-29 DIAGNOSIS — E1122 Type 2 diabetes mellitus with diabetic chronic kidney disease: Secondary | ICD-10-CM | POA: Diagnosis not present

## 2019-01-08 ENCOUNTER — Encounter: Payer: Self-pay | Admitting: Family Medicine

## 2019-01-08 ENCOUNTER — Ambulatory Visit (INDEPENDENT_AMBULATORY_CARE_PROVIDER_SITE_OTHER): Payer: Medicare HMO | Admitting: Family Medicine

## 2019-01-08 ENCOUNTER — Telehealth: Payer: Self-pay | Admitting: *Deleted

## 2019-01-08 ENCOUNTER — Ambulatory Visit (HOSPITAL_BASED_OUTPATIENT_CLINIC_OR_DEPARTMENT_OTHER)
Admission: RE | Admit: 2019-01-08 | Discharge: 2019-01-08 | Disposition: A | Discharge status: Home / Self Care | Payer: Medicare HMO | Source: Ambulatory Visit | Attending: Family Medicine | Admitting: Family Medicine

## 2019-01-08 VITALS — BP 118/86 | HR 72 | Resp 12 | Ht 65.5 in | Wt 136.0 lb

## 2019-01-08 DIAGNOSIS — N186 End stage renal disease: Secondary | ICD-10-CM | POA: Insufficient documentation

## 2019-01-08 DIAGNOSIS — Z992 Dependence on renal dialysis: Secondary | ICD-10-CM

## 2019-01-08 DIAGNOSIS — S82002A Unspecified fracture of left patella, initial encounter for closed fracture: Secondary | ICD-10-CM

## 2019-01-08 DIAGNOSIS — I10 Essential (primary) hypertension: Secondary | ICD-10-CM | POA: Diagnosis not present

## 2019-01-08 DIAGNOSIS — M546 Pain in thoracic spine: Secondary | ICD-10-CM | POA: Insufficient documentation

## 2019-01-08 DIAGNOSIS — D638 Anemia in other chronic diseases classified elsewhere: Secondary | ICD-10-CM

## 2019-01-08 DIAGNOSIS — D649 Anemia, unspecified: Secondary | ICD-10-CM

## 2019-01-08 DIAGNOSIS — R413 Other amnesia: Secondary | ICD-10-CM

## 2019-01-08 LAB — CBC WITH DIFFERENTIAL/PLATELET
Basophils Absolute: 0 10*3/uL (ref 0.0–0.1)
Basophils Relative: 1 % (ref 0.0–3.0)
Eosinophils Absolute: 0.1 10*3/uL (ref 0.0–0.7)
Eosinophils Relative: 3.6 % (ref 0.0–5.0)
HCT: 28.7 % — ABNORMAL LOW (ref 36.0–46.0)
Hemoglobin: 9.1 g/dL — ABNORMAL LOW (ref 12.0–15.0)
Lymphocytes Relative: 13.8 % (ref 12.0–46.0)
Lymphs Abs: 0.3 10*3/uL — ABNORMAL LOW (ref 0.7–4.0)
MCHC: 31.8 g/dL (ref 30.0–36.0)
MCV: 99.6 fl (ref 78.0–100.0)
MONO ABS: 0.2 10*3/uL (ref 0.1–1.0)
Monocytes Relative: 10 % (ref 3.0–12.0)
Neutro Abs: 1.5 10*3/uL (ref 1.4–7.7)
Neutrophils Relative %: 71.6 % (ref 43.0–77.0)
Platelets: 92 10*3/uL — ABNORMAL LOW (ref 150.0–400.0)
RBC: 2.88 Mil/uL — ABNORMAL LOW (ref 3.87–5.11)
RDW: 19.9 % — ABNORMAL HIGH (ref 11.5–15.5)

## 2019-01-08 LAB — COMPREHENSIVE METABOLIC PANEL
ALT: 18 U/L (ref 0–35)
AST: 26 U/L (ref 0–37)
Albumin: 3.4 g/dL — ABNORMAL LOW (ref 3.5–5.2)
Alkaline Phosphatase: 110 U/L (ref 39–117)
BUN: 60 mg/dL — ABNORMAL HIGH (ref 6–23)
CO2: 24 mEq/L (ref 19–32)
CREATININE: 7.48 mg/dL — AB (ref 0.40–1.20)
Calcium: 9.3 mg/dL (ref 8.4–10.5)
Chloride: 99 mEq/L (ref 96–112)
GFR: 6.38 mL/min — CL (ref >60.00)
Glucose, Bld: 70 mg/dL (ref 70–99)
Potassium: 4.5 mEq/L (ref 3.5–5.1)
SODIUM: 138 meq/L (ref 135–145)
Total Bilirubin: 0.6 mg/dL (ref 0.2–1.2)
Total Protein: 6.7 g/dL (ref 6.0–8.3)

## 2019-01-08 LAB — IBC + FERRITIN
Ferritin: 624.4 ng/mL — ABNORMAL HIGH (ref 10.0–291.0)
Iron: 41 ug/dL — ABNORMAL LOW (ref 42–145)
Saturation Ratios: 16 % — ABNORMAL LOW (ref 20.0–50.0)
Transferrin: 183 mg/dL — ABNORMAL LOW (ref 212.0–360.0)

## 2019-01-08 NOTE — Progress Notes (Addendum)
Patient ID: [name] [name], female    DOB: Jan 18, 1941  Age: 78 y.o. MRN: [id number]    Subjective:  Subjective  HPI [name] [name] presents for f/u hosp for falls.  She needs home health -- pt etc She also needs a referral to ortho for fractured patella  Her daughter and her husband are here and they c/o memory problems that are worsening.   She has missed 2 dialysis app this month per her daughter and has not seen the nephrologist in 5 years      Review of Systems  Constitutional: Positive for fatigue. Negative for appetite change, diaphoresis and unexpected weight change.  Eyes: Negative for pain, redness and visual disturbance.  Respiratory: Negative for cough, chest tightness, shortness of breath and wheezing.   Cardiovascular: Negative for chest pain, palpitations and leg swelling.  Endocrine: Negative for cold intolerance, heat intolerance, polydipsia, polyphagia and polyuria.  Genitourinary: Negative for difficulty urinating, dysuria and frequency.  Neurological: Negative for dizziness, light-headedness, numbness and headaches.  Psychiatric/Behavioral: Positive for confusion and decreased concentration.    History Past Medical History:  Diagnosis Date  . DEPRESSION   . DIABETES MELLITUS, TYPE I   . ESRD (end stage renal disease) on dialysis ([place])    MWF HD  . GERD   . GLAUCOMA   . Headache(784.0)   . Hepatitis B carrier ([place])    05/2009: neg Hep C; Hep B: core pos, Surf neg; fatty liver US 8/10 - 7/13  . HYPERLIPIDEMIA   . HYPERTENSION   . OSTEOPENIA   . POSTMENOPAUSAL STATUS   . Pulmonary edema   . Unspecified vitamin D deficiency     She has a past surgical history that includes Cholecystectomy (02/12/07); Tonsillectomy; Refractive surgery (07/29/09); Insertion of dialysis catheter (Right, 05/27/2014); AV fistula placement (Left, 05/29/2014); A/V SHUNTOGRAM (N/A, 09/06/2018); PERIPHERAL VASCULAR BALLOON ANGIOPLASTY (Left, 09/06/2018); and IR DIALY SHUNT INTRO  NEEDLE/INTRACATH INITIAL W/IMG LEFT (Left, 12/14/2018).   Her family history includes Arthritis in an other family member; Coronary artery disease in an other family member; Diabetes in an other family member; Hypertension in an other family member; Kidney disease in her mother and sister.She reports that she has never smoked. She has never used smokeless tobacco. She reports that she does not drink alcohol or use drugs.  Current Outpatient Medications on File Prior to Visit  Medication Sig Dispense Refill  . aspirin EC 81 MG tablet Take 81 mg by mouth daily.    . carvedilol (COREG) 25 MG tablet Take 25 mg by mouth daily.    . isosorbide mononitrate (IMDUR) 30 MG 24 hr tablet Take 30 mg by mouth daily.    . folic acid-vitamin b complex-vitamin c-selenium-zinc (DIALYVITE) 3 MG TABS tablet Take 1 tablet by mouth daily.    . furosemide (LASIX) 40 MG tablet Take 40 mg by mouth daily.    Marland Kitchen HYDROcodone-acetaminophen (NORCO/VICODIN) 5-325 MG tablet Take 1 tablet by mouth every 6 (six) hours as needed. (Patient not taking: Reported on 01/08/2019) 8 tablet 0  . Multiple Vitamin (MULTIVITAMIN WITH MINERALS) TABS tablet Take 1 tablet by mouth daily. (Patient not taking: Reported on 01/08/2019) 30 tablet 0  . nitroGLYCERIN (NITROSTAT) 0.4 MG SL tablet Place 1 tablet (0.4 mg total) under the tongue every 5 (five) minutes as needed for chest pain. (Patient not taking: Reported on 01/08/2019) 30 tablet 0  . sevelamer carbonate (RENVELA) 800 MG tablet Take 2 tablets (1,600 mg total) by mouth 3 (three) times daily with  meals. (Patient not taking: Reported on 01/08/2019) 90 tablet 0   No current facility-administered medications on file prior to visit.      Objective:  Objective  Physical Exam Vitals signs and nursing note reviewed.  Constitutional:      Appearance: She is well-developed.  HENT:     Head: Normocephalic and atraumatic.  Eyes:     Conjunctiva/sclera: Conjunctivae normal.  Neck:      Musculoskeletal: Normal range of motion and neck supple.     Thyroid: No thyromegaly.     Vascular: No carotid bruit or JVD.  Cardiovascular:     Rate and Rhythm: Normal rate and regular rhythm.     Heart sounds: Murmur present.  Pulmonary:     Effort: Pulmonary effort is normal. No respiratory distress.     Breath sounds: Normal breath sounds. No wheezing or rales.  Chest:     Chest wall: No tenderness.  Neurological:     Mental Status: She is alert and oriented to person, place, and time.     Motor: Weakness present.     Gait: Gait abnormal.  Psychiatric:        Cognition and Memory: Memory is impaired.    BP 118/86 (BP Location: Right Arm, Patient Position: Sitting, Cuff Size: Normal)   Pulse 72   Resp 12   Ht 5' 5.5" (1.664 m)   Wt 136 lb (61.7 kg)   SpO2 98%   BMI 22.29 kg/m  Wt Readings from Last 3 Encounters:  01/08/19 136 lb (61.7 kg)  12/15/18 123 lb 3.8 oz (55.9 kg)  11/03/18 114 lb 3.2 oz (51.8 kg)     Lab Results  Component Value Date   WBC 2.1 aL (L) 01/08/2019   HGB 9.1 (L) 01/08/2019   HCT 28.7 (L) 01/08/2019   PLT 92.0 (L) 01/08/2019   GLUCOSE 70 01/08/2019   CHOL 193 08/21/2018   TRIG 92.0 08/21/2018   HDL 62.60 08/21/2018   LDLDIRECT 196.2 04/26/2013   LDLCALC 112 (H) 08/21/2018   ALT 18 01/08/2019   AST 26 01/08/2019   NA 138 01/08/2019   K 4.5 01/08/2019   CL 99 01/08/2019   CREATININE 7.48 (HH) 01/08/2019   BUN 60 (H) 01/08/2019   CO2 24 01/08/2019   TSH 1.158 11/23/2018   INR 1.19 12/14/2018   HGBA1C 4.8 11/15/2017   MICROALBUR 174.7 (H) 01/03/2008    Dg Lumbar Spine Complete  Result Date: 12/27/2018 CLINICAL DATA:  Right hip and low back pain after fall from wheelchair today. EXAM: LUMBAR SPINE - COMPLETE 4+ VIEW COMPARISON:  11/07/2017 FINDINGS: Diffuse bone demineralization. Five lumbar type vertebral bodies. Mild anterior subluxation of L4 on L5, unchanged since prior study. Diffuse degenerative changes with narrowed interspaces  and endplate hypertrophic changes. No vertebral compression deformities. No focal bone lesion or bone destruction. Degenerative changes in the lumbar facet joints. Visualized sacrum appears intact. Extensive vascular calcifications in the abdomen and pelvis. Surgical clips in the right upper quadrant. IMPRESSION: Degenerative changes in the lumbar spine. No acute displaced fractures identified. Electronically Signed   By: Lucienne Capers M.D.   On: 12/27/2018 22:51   Dg Hip Unilat W Or Wo Pelvis 2-3 Views Right  Result Date: 12/27/2018 CLINICAL DATA:  Right hip pain after fall from wheelchair today. EXAM: DG HIP (WITH OR WITHOUT PELVIS) 2-3V RIGHT COMPARISON:  09/28/2018 FINDINGS: Diffuse bone demineralization. Degenerative changes in the lower lumbar spine and in both hips. No evidence of acute fracture or dislocation  of the pelvis or right hip. No focal bone lesion or bone destruction. SI joints and symphysis pubis are not displaced. Extensive vascular calcifications. IMPRESSION: No acute bony abnormalities. Degenerative changes in the right hip. Extensive vascular atherosclerosis. Electronically Signed   By: Lucienne Capers M.D.   On: 12/27/2018 22:53     Assessment & Plan:  Plan  I am having [name] maintain her multivitamin with minerals, sevelamer carbonate, nitroGLYCERIN, HYDROcodone-acetaminophen, folic acid-vitamin b complex-vitamin c-selenium-zinc, furosemide, isosorbide mononitrate, carvedilol, and aspirin EC.  No orders of the defined types were placed in this encounter.   Problem List Items Addressed This Visit      Unprioritized   Anemia, chronic disease    Check labs today      ESRD (end stage renal disease) ([place])   Relevant Orders   Ambulatory referral to Nephrology   ESRD (end stage renal disease) on dialysis ([place])    Pt has not seen nephrology in 5 years She has only been going to dialysis She has stopped taking most of her meds  She only takes bp meds         Essential hypertension   Relevant Orders   CBC with Differential/Platelet (Completed)   Comprehensive metabolic panel (Completed)    Other Visit Diagnoses    Anemia, unspecified type    -  Primary   Relevant Orders   CBC with Differential/Platelet (Completed)   Comprehensive metabolic panel (Completed)   IBC + Ferritin (Completed)   Ambulatory referral to Nephrology   Closed nondisplaced fracture of left patella, unspecified fracture morphology, initial encounter       Relevant Orders   Ambulatory referral to Orthopedic Surgery   Thoracic spine pain       Relevant Orders   DG Thoracic Spine 2 View   Memory deficit       Relevant Orders   Ambulatory referral to Neurology      Follow-up: Return in about 3 months (around 04/10/2019), or if symptoms worsen or fail to improve.  Ann Held, DO

## 2019-01-08 NOTE — Patient Instructions (Signed)

## 2019-01-08 NOTE — Telephone Encounter (Signed)
Called [facility] for palliative care.  They will call her daughter [name] to get her set up.

## 2019-01-08 NOTE — Assessment & Plan Note (Signed)
Pt has not seen nephrology in 5 years She has only been going to dialysis She has stopped taking most of her meds  She only takes bp meds

## 2019-01-08 NOTE — Assessment & Plan Note (Signed)
Check labs today.

## 2019-01-09 ENCOUNTER — Telehealth: Payer: Self-pay | Admitting: *Deleted

## 2019-01-09 ENCOUNTER — Other Ambulatory Visit: Payer: Self-pay

## 2019-01-09 ENCOUNTER — Emergency Department (HOSPITAL_COMMUNITY)
Admission: EM | Admit: 2019-01-09 | Discharge: 2019-01-09 | Disposition: A | Discharge status: Home / Self Care | Payer: Medicare HMO | Attending: Emergency Medicine | Admitting: Emergency Medicine

## 2019-01-09 ENCOUNTER — Encounter: Payer: Self-pay | Admitting: Nephrology

## 2019-01-09 ENCOUNTER — Encounter: Payer: Self-pay | Admitting: *Deleted

## 2019-01-09 DIAGNOSIS — I120 Hypertensive chronic kidney disease with stage 5 chronic kidney disease or end stage renal disease: Secondary | ICD-10-CM | POA: Diagnosis not present

## 2019-01-09 DIAGNOSIS — T82838A Hemorrhage of vascular prosthetic devices, implants and grafts, initial encounter: Secondary | ICD-10-CM | POA: Diagnosis present

## 2019-01-09 DIAGNOSIS — Y658 Other specified misadventures during surgical and medical care: Secondary | ICD-10-CM | POA: Diagnosis not present

## 2019-01-09 DIAGNOSIS — T82590D Other mechanical complication of surgically created arteriovenous fistula, subsequent encounter: Secondary | ICD-10-CM | POA: Diagnosis not present

## 2019-01-09 DIAGNOSIS — E1022 Type 1 diabetes mellitus with diabetic chronic kidney disease: Secondary | ICD-10-CM | POA: Diagnosis not present

## 2019-01-09 DIAGNOSIS — Z79899 Other long term (current) drug therapy: Secondary | ICD-10-CM | POA: Insufficient documentation

## 2019-01-09 DIAGNOSIS — N186 End stage renal disease: Secondary | ICD-10-CM | POA: Insufficient documentation

## 2019-01-09 DIAGNOSIS — Z992 Dependence on renal dialysis: Secondary | ICD-10-CM | POA: Diagnosis not present

## 2019-01-09 DIAGNOSIS — Z7982 Long term (current) use of aspirin: Secondary | ICD-10-CM | POA: Diagnosis not present

## 2019-01-09 LAB — CBC WITH DIFFERENTIAL/PLATELET
Abs Immature Granulocytes: 0.01 10*3/uL (ref 0.00–0.07)
Basophils Absolute: 0 10*3/uL (ref 0.0–0.1)
Basophils Relative: 1 %
Eosinophils Absolute: 0.1 10*3/uL (ref 0.0–0.5)
Eosinophils Relative: 3 %
HCT: 28.2 % — ABNORMAL LOW (ref 36.0–46.0)
Hemoglobin: 8.6 g/dL — ABNORMAL LOW (ref 12.0–15.0)
Immature Granulocytes: 0 %
Lymphocytes Relative: 14 %
Lymphs Abs: 0.4 10*3/uL — ABNORMAL LOW (ref 0.7–4.0)
MCH: 31.3 pg (ref 26.0–34.0)
MCHC: 30.5 g/dL (ref 30.0–36.0)
MCV: 102.5 fL — ABNORMAL HIGH (ref 80.0–100.0)
Monocytes Absolute: 0.2 10*3/uL (ref 0.1–1.0)
Monocytes Relative: 8 %
NRBC: 0 % (ref 0.0–0.2)
Neutro Abs: 1.9 10*3/uL (ref 1.7–7.7)
Neutrophils Relative %: 74 %
PLATELETS: 93 10*3/uL — AB (ref 150–400)
RBC: 2.75 MIL/uL — ABNORMAL LOW (ref 3.87–5.11)
RDW: 18.2 % — ABNORMAL HIGH (ref 11.5–15.5)
WBC: 2.6 10*3/uL — ABNORMAL LOW (ref 4.0–10.5)

## 2019-01-09 LAB — BASIC METABOLIC PANEL
ANION GAP: 10 (ref 5–15)
BUN: 30 mg/dL — ABNORMAL HIGH (ref 8–23)
CHLORIDE: 98 mmol/L (ref 98–111)
CO2: 26 mmol/L (ref 22–32)
Calcium: 8.7 mg/dL — ABNORMAL LOW (ref 8.9–10.3)
Creatinine, Ser: 4.63 mg/dL — ABNORMAL HIGH (ref 0.44–1.00)
GFR calc Af Amer: 10 mL/min — ABNORMAL LOW (ref >60)
GFR calc non Af Amer: 9 mL/min — ABNORMAL LOW (ref >60)
Glucose, Bld: 89 mg/dL (ref 70–99)
Potassium: 3.9 mmol/L (ref 3.5–5.1)
Sodium: 134 mmol/L — ABNORMAL LOW (ref 135–145)

## 2019-01-09 NOTE — ED Notes (Signed)
Attempted blood draw x2, unsuccessful.

## 2019-01-09 NOTE — Telephone Encounter (Signed)
Received Medical records from [facility]; forwarded to provider/SLS 03/11

## 2019-01-09 NOTE — ED Notes (Signed)
Patient verbalizes understanding of discharge instructions. Opportunity for questioning and answers were provided. Armband removed by staff, pt discharged from ED.  

## 2019-01-09 NOTE — Telephone Encounter (Signed)
CRITICAL VALUE STICKER  CRITICAL VALUE:  Creatinine - 7.48, GFR - 6.38,  Potassium - 4.5 no hemolysis noted  RECEIVER (on-site recipient of call): came via fax after 5pm  DATE & TIME NOTIFIED: 01/09/19  MESSENGER (representative from lab): Saa  MD NOTIFIED:  Routed to Doc of the Day in PCP's absence. CMA notified as well.  TIME OF NOTIFICATION: 7:14am  RESPONSE:

## 2019-01-09 NOTE — ED Provider Notes (Signed)
[facility] EMERGENCY DEPARTMENT Provider Note   CSN: [id number] Arrival date & time: 01/09/19  1634    History   Chief Complaint Chief Complaint  Patient presents with  . Bleeding from fistula    HPI [name] [name] is a 78 y.o. female.     HPI   Patient is a 78 year old female with a history of type 1 diabetes, ESRD on dialysis ([facility]), GERD, hyperlipidemia, hypertension, who presents to the emergency department today for evaluation of bleeding from her fistula.  Patient was at dialysis prior to arrival and was about an hour and 20 minutes into dialysis and had to stop because she was having bleeding around the needle at her site.  She states that this happened in the past.  A dressing was placed on the patient and she states that this dressing has been in place for about 50 minutes prior to her arrival in the ED.  She denies any other issues except for some chest wall pain on the right side that she has had since she fell several weeks ago onto her chest.  It is not worse with exertion and she has no shortness of breath.  No pain with inspiration.  She also has some pain to her right trapezius muscle from this fall.  On review of records it does appear the patient was evaluated after a fall on 12/27/2018.  She had negative work-up at that time.  States she missed 1 session of dialysis on Monday 01/07/19 of this week.   Past Medical History:  Diagnosis Date  . DEPRESSION   . DIABETES MELLITUS, TYPE I   . ESRD (end stage renal disease) on dialysis ([place])    MWF HD  . GERD   . GLAUCOMA   . Headache(784.0)   . Hepatitis B carrier ([place])    05/2009: neg Hep C; Hep B: core pos, Surf neg; fatty liver US 8/10 - 7/13  . HYPERLIPIDEMIA   . HYPERTENSION   . OSTEOPENIA   . POSTMENOPAUSAL STATUS   . Pulmonary edema   . Unspecified vitamin D deficiency     Patient Active Problem List   Diagnosis Date Noted  . ESRD (end stage renal disease) ([place]) 01/08/2019  . AV graft  malfunction, initial encounter ([place]) 12/14/2018  . Acute respiratory failure with hypoxia ([place]) 12/13/2018  . AV graft malfunction ([facility]) 09/03/2018  . Urinary tract infection without hematuria 08/21/2018  . Memory loss 08/21/2018  . Anemia, chronic disease 03/01/2018  . Vasovagal syncope 03/01/2018  . Macrocytic anemia 01/28/2018  . Orthostatic syncope 01/28/2018  . EKG, abnormal   . Acute metabolic encephalopathy 66/04/3015  . Influenza 11/16/2017  . Fever in adult 11/15/2017  . Demand ischemia ([place]) 11/10/2017  . Normocytic anemia 11/10/2017  . Aortic atherosclerosis ([place]) 11/10/2017  . Respiratory failure with hypoxia ([place]) 08/18/2017  . Volume overload 08/18/2017  . Dialysis patient, noncompliant ([place]) 08/17/2017  . Thumb pain, left 12/22/2016  . Chronic diastolic heart failure ([place]) 11/25/2016  . ESRD (end stage renal disease) on dialysis ([place]) 06/25/2014  . Malignant hypertension 05/17/2014  . Chest pain 05/17/2014  . Elevated troponin 05/17/2014  . Hypertensive emergency 05/17/2014  . NSTEMI (non-ST elevated myocardial infarction) ([place]) 05/17/2014  . GERD 06/17/2009  . Hepatitis B carrier ([place]) 06/17/2009  . DEPRESSION 05/20/2009  . Unspecified glaucoma 05/20/2009  . HEADACHE 05/20/2009  . CARDIAC MURMUR 05/20/2009  . OSTEOPENIA 02/15/2008  . POSTMENOPAUSAL STATUS 12/21/2007  . Hyperlipidemia LDL goal <70  09/21/2007  . Essential hypertension 09/13/2007  . Type II diabetes mellitus with nephropathy [facility])     Past Surgical History:  Procedure Laterality Date  . A/V SHUNTOGRAM N/A 09/06/2018   Procedure: A/V SHUNTOGRAM - Left AV;  Surgeon: Marty Heck, MD;  Location: [facility];  Service: Cardiovascular;  Laterality: N/A;  . AV FISTULA PLACEMENT Left 05/29/2014   Procedure: INSERTION OF ARTERIOVENOUS (AV) GORE-TEX GRAFT ARM-LEFT;  Surgeon: Angelia Mould, MD;  Location: [facility];  Service: Vascular;  Laterality: Left;  . CHOLECYSTECTOMY  02/12/07   . INSERTION OF DIALYSIS CATHETER Right 05/27/2014   Procedure: INSERTION OF DIALYSIS CATHETER;  Surgeon: Angelia Mould, MD;  Location: [facility];  Service: Vascular;  Laterality: Right;  . IR DIALY SHUNT INTRO [place] W/IMG LEFT Left 12/14/2018  . PERIPHERAL VASCULAR BALLOON ANGIOPLASTY Left 09/06/2018   Procedure: PERIPHERAL VASCULAR BALLOON ANGIOPLASTY;  Surgeon: Marty Heck, MD;  Location: [facility];  Service: Cardiovascular;  Laterality: Left;  arm shunt  . REFRACTIVE SURGERY  07/29/09   Dr. Bing Plume  . TONSILLECTOMY       OB History   No obstetric history on file.      Home Medications    Prior to Admission medications   Medication Sig Start Date End Date Taking? Authorizing Provider  aspirin EC 81 MG tablet Take 81 mg by mouth daily.    [provider]  carvedilol (COREG) 25 MG tablet Take 25 mg by mouth daily.    [provider]  folic acid-vitamin b complex-vitamin c-selenium-zinc (DIALYVITE) 3 MG TABS tablet Take 1 tablet by mouth daily.    [provider]  furosemide (LASIX) 40 MG tablet Take 40 mg by mouth daily.    [provider]  HYDROcodone-acetaminophen (NORCO/VICODIN) 5-325 MG tablet Take 1 tablet by mouth every 6 (six) hours as needed. Patient not taking: Reported on 01/08/2019 12/08/18   Drenda Freeze, MD  isosorbide mononitrate (IMDUR) 30 MG 24 hr tablet Take 30 mg by mouth daily.    [provider]  Multiple Vitamin (MULTIVITAMIN WITH MINERALS) TABS tablet Take 1 tablet by mouth daily. Patient not taking: Reported on 01/08/2019 10/19/18   Kayleen Memos, DO  nitroGLYCERIN (NITROSTAT) 0.4 MG SL tablet Place 1 tablet (0.4 mg total) under the tongue every 5 (five) minutes as needed for chest pain. Patient not taking: Reported on 01/05/6577 02/04/95   Delora Fuel, MD  sevelamer carbonate (RENVELA) 800 MG tablet Take 2 tablets (1,600 mg total) by mouth 3 (three) times daily with meals. Patient  not taking: Reported on 01/08/2019 10/18/18   Kayleen Memos, DO    Family History Family History  Problem Relation Age of Onset  . Kidney disease Mother   . Coronary artery disease Other   . Diabetes Other   . Hypertension Other   . Arthritis Other   . Kidney disease Sister     Social History Social History   Tobacco Use  . Smoking status: Never Smoker  . Smokeless tobacco: Never Used  . Tobacco comment: Married x's 74yrs, 6 kids-scattered [employer]. Retired Consulting civil engineer  Substance Use Topics  . Alcohol use: No  . Drug use: No     Allergies   Hydralazine and Robaxin [methocarbamol]   Review of Systems Review of Systems  Constitutional: Negative for fever.  HENT: Negative for ear pain and sore throat.   Eyes: Negative for visual disturbance.  Respiratory: Negative for cough and shortness  of breath.   Cardiovascular: Negative for palpitations.       Chest wall pain  Gastrointestinal: Negative for abdominal pain, nausea and vomiting.  Genitourinary: Negative for dysuria and hematuria.  Musculoskeletal:       Pain to right trapezius muscle  Skin:       Fistula bleeding  Neurological: Negative for headaches.  All other systems reviewed and are negative.  Physical Exam Updated Vital Signs BP 116/71   Pulse 76   Temp 98.6 F (37 C) (Oral)   Resp 16   Ht 5' 5.5" (1.664 m)   Wt 61.7 kg   SpO2 100%   BMI 22.29 kg/m   Physical Exam Vitals signs and nursing note reviewed.  Constitutional:      General: She is not in acute distress.    Appearance: She is well-developed.  HENT:     Head: Normocephalic and atraumatic.  Eyes:     Conjunctiva/sclera: Conjunctivae normal.  Neck:     Musculoskeletal: Neck supple.  Cardiovascular:     Rate and Rhythm: Normal rate and regular rhythm.     Heart sounds: No murmur.  Pulmonary:     Effort: Pulmonary effort is normal. No respiratory distress.     Breath sounds: Normal breath sounds.  Abdominal:     Palpations:  Abdomen is soft.     Tenderness: There is no abdominal tenderness.  Musculoskeletal:     Comments: Palpable thrill to LUE fistula. 4x4 dressing in place with tape in place, gauze is not completely soaked through with blood. Dressing removed and there is no active bleeding from the fistula. No midline ttp to the CTL spine. TTP to the right trapezius musculature.  Skin:    General: Skin is warm and dry.  Neurological:     Mental Status: She is alert.    ED Treatments / Results  Labs (all labs ordered are listed, but only abnormal results are displayed) Labs Reviewed  BASIC METABOLIC PANEL - Abnormal; Notable for the following components:      Result Value   Sodium 134 (*)    BUN 30 (*)    Creatinine, Ser 4.63 (*)    Calcium 8.7 (*)    GFR calc non Af Amer 9 (*)    GFR calc Af Amer 10 (*)    All other components within normal limits  CBC WITH DIFFERENTIAL/PLATELET - Abnormal; Notable for the following components:   WBC 2.6 (*)    RBC 2.75 (*)    Hemoglobin 8.6 (*)    HCT 28.2 (*)    MCV 102.5 (*)    RDW 18.2 (*)    Platelets 93 (*)    Lymphs Abs 0.4 (*)    All other components within normal limits  CBC WITH DIFFERENTIAL/PLATELET    EKG EKG Interpretation  Date/Time:  Wednesday January 09 2019 18:28:52 EDT Ventricular Rate:  78 PR Interval:    QRS Duration: 92 QT Interval:  335 QTC Calculation: 382 R Axis:   46 Text Interpretation:  Sinus rhythm Probable left atrial enlargement Nonspecific repol abnormality, diffuse leads Baseline wander in lead(s) V5 No significant change since last tracing Confirmed by Deno Etienne [id number]) on 01/09/2019 6:30:55 PM   Radiology Dg Thoracic Spine 2 View  Result Date: 01/09/2019 CLINICAL DATA:  Upper back pain for several weeks with history of recent falls EXAM: THORACIC SPINE 2 VIEWS COMPARISON:  12/13/2018 FINDINGS: Vertebral body height is well maintained. Mild osteophytic changes are seen. No paraspinal mass is  noted. The pedicles are  within normal limits. IMPRESSION: Degenerative change without acute bony abnormality. Electronically Signed   By: Inez Catalina M.D.   On: 01/09/2019 08:58    Procedures Procedures (including critical care time)  Medications Ordered in ED Medications - No data to display   Initial Impression / Assessment and Plan / ED Course  I have reviewed the triage vital signs and the nursing notes.  Pertinent labs & imaging results that were available during my care of the patient were reviewed by me and considered in my medical decision making (see chart for details).     Final Clinical Impressions(s) / ED Diagnoses   Final diagnoses:  Malfunction of arteriovenous graft, subsequent encounter   Pt presenting for evaluation of bleeding fistula that began while at dialysis PTA. Upon arrival to the ED the bleeding has stopped. Vital signs are WNL.   Reevaluated pt. She has no continued eating after being in the department for 5 hours.  Her hemoglobin is at baseline.  Labs are reassuring.  BMP is at baseline.  No electrolyte derangement.  Patient has remained stable throughout her ED stay.  Feel that she is appropriate for outpatient follow-up with her vascular surgeon.  She will need to go to dialysis as scheduled on Friday.  Advised her to return to the ER for new or worsening symptoms in the meantime.  She voiced understanding of the plan and reasons to return.  All questions answered.  Patient stable for discharge.  Pt seen in conjunction with Dr. Tyrone Nine who personally evaluated the pt and is in agreement with plan.   ED Discharge Orders    None       Rodney Booze, PA-C 01/09/19 [place], [place], DO 01/09/19 2335

## 2019-01-09 NOTE — ED Notes (Signed)
Called main lab and spoke to [place] to tell him I was sending the CBC redraw.

## 2019-01-09 NOTE — ED Notes (Signed)
Phlebotomy tried to get labs in triage.

## 2019-01-09 NOTE — ED Notes (Signed)
Dr Tyrone Nine at bedside attempting an IV

## 2019-01-09 NOTE — Telephone Encounter (Signed)
Pt is on dialysis, critical creatinine is at baseline for her  Route to PCP as an [place]

## 2019-01-09 NOTE — Discharge Instructions (Addendum)
You will need to follow up with your vascular surgeon on regards to your fistula bleeding today. Please call their office tomorrow to set up and appointment within the next week  Return the emergency department for any new or worsening symptoms in the meantime including continued bleeding, chest pain ,or shortness of breath

## 2019-01-09 NOTE — ED Triage Notes (Signed)
Pt BIB [facility]. Pt was at dialysis today and had bleeding from her fistula during her dialysis treatment. Per EMS patient was only 1 hour and 20 minutes into dialysis and was unable to complete dialysis due to the bleeding. Bleeding is controlled per EMS. Per [facility] pt vital signs are 121/70, heart rate 89 and SpO2 of 99% on room air.

## 2019-01-09 NOTE — Telephone Encounter (Signed)
Yes-- pt missed dialysis Monday shequita is faxing labs to [facility] and calling them

## 2019-01-09 NOTE — ED Notes (Signed)
Pt is going to require IV consult to get labs.

## 2019-01-10 NOTE — Telephone Encounter (Signed)
Labs faxed and notes requested from [facility] center

## 2019-01-17 ENCOUNTER — Ambulatory Visit (INDEPENDENT_AMBULATORY_CARE_PROVIDER_SITE_OTHER): Payer: Medicare HMO | Admitting: Orthopaedic Surgery

## 2019-01-17 ENCOUNTER — Encounter (INDEPENDENT_AMBULATORY_CARE_PROVIDER_SITE_OTHER): Payer: Self-pay | Admitting: Orthopaedic Surgery

## 2019-01-17 ENCOUNTER — Other Ambulatory Visit: Payer: Self-pay

## 2019-01-17 ENCOUNTER — Ambulatory Visit (INDEPENDENT_AMBULATORY_CARE_PROVIDER_SITE_OTHER): Payer: Medicare HMO

## 2019-01-17 DIAGNOSIS — M25562 Pain in left knee: Secondary | ICD-10-CM | POA: Diagnosis not present

## 2019-01-17 DIAGNOSIS — S82035A Nondisplaced transverse fracture of left patella, initial encounter for closed fracture: Secondary | ICD-10-CM | POA: Diagnosis not present

## 2019-01-17 DIAGNOSIS — G8929 Other chronic pain: Secondary | ICD-10-CM | POA: Diagnosis not present

## 2019-01-17 NOTE — Progress Notes (Signed)
Office Visit Note   Patient: [name] [name]           Date of Birth: 01-30-1941           MRN: [id number] Visit Date: 01/17/2019              Requested by: [address] [place], [place] [address] [place], [place] [address] PCP: Carollee Herter, Alferd Apa, DO   Assessment & Plan: Visit Diagnoses:  1. Nondisplaced transverse fracture of left patella, initial encounter for closed fracture     Plan: Impression is left knee transverse fracture inferior pole of patella.  This is 5 weeks out from injury.  We will place the patient in a hinged knee brace weightbearing as tolerated for 3 weeks.  She will follow-up with [place] in 6 weeks time for repeat evaluation and final x-ray.  Call with concerns or questions in the meantime.  Follow-Up Instructions: Return in about 6 weeks (around 02/28/2019).   Orders:  Orders Placed This Encounter  Procedures  . XR Knee Complete 4 Views Left   No orders of the defined types were placed in this encounter.     Procedures: No procedures performed   Clinical Data: No additional findings.   Subjective: No chief complaint on file.   HPI patient is a pleasant 78 year old female who presents for clinic today for follow-up of her left knee.  She sustained a fall landing on the anterior aspect of her left knee approximately 5 weeks ago.  She is seen in the ED where x-rays were obtained.  X-rays showed a nondisplaced transverse fracture to the inferior pole of the patella.  She was placed in a knee immobilizer.  She did not wear the knee immobilizer due to it being too cumbersome.  She has been wearing what sounds like a knee sleeve.  She has been taking Tylenol and using BenGay for pain relief.  Overall she has improved.  Review of Systems as detailed in HPI.  All others reviewed and are negative.   Objective: Vital Signs: There were no vitals taken for this visit.  Physical Exam well-developed well-nourished female no acute distress.   Alert and oriented x3.  Ortho Exam examination of her left knee shows no effusion.  No tenderness to the patella.  Range of motion 0 to 95 degrees.  She has neurovascularly intact distally.  Specialty Comments:  No specialty comments available.  Imaging: Xr Knee Complete 4 Views Left  Result Date: 01/17/2019 X-rays demonstrate a transverse fracture inferior pole of the patella with moderate bony consolidation    PMFS History: Patient Active Problem List   Diagnosis Date Noted  . Chronic pain of left knee 01/17/2019  . ESRD (end stage renal disease) ([place]) 01/08/2019  . AV graft malfunction, initial encounter ([place]) 12/14/2018  . Acute respiratory failure with hypoxia ([place]) 12/13/2018  . AV graft malfunction ([facility]) 09/03/2018  . Urinary tract infection without hematuria 08/21/2018  . Memory loss 08/21/2018  . Anemia, chronic disease 03/01/2018  . Vasovagal syncope 03/01/2018  . Macrocytic anemia 01/28/2018  . Orthostatic syncope 01/28/2018  . EKG, abnormal   . Acute metabolic encephalopathy 77/93/9030  . Influenza 11/16/2017  . Fever in adult 11/15/2017  . Demand ischemia ([place]) 11/10/2017  . Normocytic anemia 11/10/2017  . Aortic atherosclerosis ([place]) 11/10/2017  . Respiratory failure with hypoxia ([place]) 08/18/2017  . Volume overload 08/18/2017  . Dialysis patient, noncompliant ([place]) 08/17/2017  . Thumb pain, left 12/22/2016  .  Chronic diastolic heart failure ([place]) 11/25/2016  . ESRD (end stage renal disease) on dialysis ([place]) 06/25/2014  . Malignant hypertension 05/17/2014  . Chest pain 05/17/2014  . Elevated troponin 05/17/2014  . Hypertensive emergency 05/17/2014  . NSTEMI (non-ST elevated myocardial infarction) ([place]) 05/17/2014  . GERD 06/17/2009  . Hepatitis B carrier ([place]) 06/17/2009  . DEPRESSION 05/20/2009  . Unspecified glaucoma 05/20/2009  . HEADACHE 05/20/2009  . CARDIAC MURMUR 05/20/2009  . OSTEOPENIA 02/15/2008  . POSTMENOPAUSAL STATUS 12/21/2007  .  Hyperlipidemia LDL goal <70 09/21/2007  . Essential hypertension 09/13/2007  . Type II diabetes mellitus with nephropathy (H[facility])    Past Medical History:  Diagnosis Date  . DEPRESSION   . DIABETES MELLITUS, TYPE I   . ESRD (end stage renal disease) on dialysis ([place])    MWF HD  . GERD   . GLAUCOMA   . Headache(784.0)   . Hepatitis B carrier ([place])    05/2009: neg Hep C; Hep B: core pos, Surf neg; fatty liver US 8/10 - 7/13  . HYPERLIPIDEMIA   . HYPERTENSION   . OSTEOPENIA   . POSTMENOPAUSAL STATUS   . Pulmonary edema   . Unspecified vitamin D deficiency     Family History  Problem Relation Age of Onset  . Kidney disease Mother   . Coronary artery disease Other   . Diabetes Other   . Hypertension Other   . Arthritis Other   . Kidney disease Sister     Past Surgical History:  Procedure Laterality Date  . A/V SHUNTOGRAM N/A 09/06/2018   Procedure: A/V SHUNTOGRAM - Left AV;  Surgeon: Marty Heck, MD;  Location: [facility];  Service: Cardiovascular;  Laterality: N/A;  . AV FISTULA PLACEMENT Left 05/29/2014   Procedure: INSERTION OF ARTERIOVENOUS (AV) GORE-TEX GRAFT ARM-LEFT;  Surgeon: Angelia Mould, MD;  Location: [facility];  Service: Vascular;  Laterality: Left;  . CHOLECYSTECTOMY  02/12/07  . INSERTION OF DIALYSIS CATHETER Right 05/27/2014   Procedure: INSERTION OF DIALYSIS CATHETER;  Surgeon: Angelia Mould, MD;  Location: [facility];  Service: Vascular;  Laterality: Right;  . IR DIALY SHUNT INTRO [place] W/IMG LEFT Left 12/14/2018  . PERIPHERAL VASCULAR BALLOON ANGIOPLASTY Left 09/06/2018   Procedure: PERIPHERAL VASCULAR BALLOON ANGIOPLASTY;  Surgeon: Marty Heck, MD;  Location: [facility];  Service: Cardiovascular;  Laterality: Left;  arm shunt  . REFRACTIVE SURGERY  07/29/09   Dr. Bing Plume  . TONSILLECTOMY     Social History   Occupational History  . Not on file  Tobacco Use  . Smoking status: Never Smoker  . Smokeless  tobacco: Never Used  . Tobacco comment: Married x's 18yrs, 6 kids-scattered [employer]. Retired Consulting civil engineer  Substance and Sexual Activity  . Alcohol use: No  . Drug use: No  . Sexual activity: Not Currently    Birth control/protection: None

## 2019-01-24 ENCOUNTER — Telehealth (INDEPENDENT_AMBULATORY_CARE_PROVIDER_SITE_OTHER): Payer: Self-pay

## 2019-01-24 NOTE — Telephone Encounter (Signed)
pts daughter called to advise that pt has been using the sample of Pennsaid and it is helping so they would like a rx sent in to [facility].   Also, she has been wearing the brace on her left knee and it is helping but still complaining of right knee pain. Can they get a brace for the right knee as well?

## 2019-01-24 NOTE — Telephone Encounter (Signed)
See message below °

## 2019-01-25 MED ORDER — DICLOFENAC SODIUM 2 % TD SOLN: TRANSDERMAL | 0 refills | Status: DC

## 2019-01-25 NOTE — Telephone Encounter (Signed)
Submitted rx to [facility]

## 2019-01-25 NOTE — Telephone Encounter (Signed)
Will you call in rx for pennsaid, but [facility]?  Whichever we need to use and let daughter know it will come from diff pharmacy.  [facility] not [facility] pennsaid.  Also ok to get brace for right knee

## 2019-01-25 NOTE — Addendum Note (Signed)
Addended byLaurann [place] on: 01/25/2019 02:01 PM   Modules accepted: Orders

## 2019-02-07 ENCOUNTER — Telehealth: Payer: Self-pay | Admitting: *Deleted

## 2019-02-07 NOTE — Telephone Encounter (Signed)
Received Physician Orders from [place], program of [place]; forwarded to provider/SLS 04/09

## 2019-02-12 ENCOUNTER — Telehealth: Payer: Self-pay | Admitting: *Deleted

## 2019-02-12 DIAGNOSIS — N186 End stage renal disease: Secondary | ICD-10-CM

## 2019-02-12 DIAGNOSIS — R55 Syncope and collapse: Secondary | ICD-10-CM

## 2019-02-12 NOTE — Telephone Encounter (Signed)
Copied from [place] [id number]. Topic: General - Other >> Feb 11, 2019  4:12 PM Oneta Rack wrote: [name] [name] Human name: [name] [name]  Relation to pt: RN from [place]  Call back number: [phone]    Reason for call:  Requesting orders for bed side commode please fax [facility] (nurse did not know the fax #) .  Requesting full panel lab orders such as iron cbc as per nurse its been awhile since labs were drawn, please advise

## 2019-02-13 ENCOUNTER — Telehealth: Payer: Self-pay

## 2019-02-13 NOTE — Telephone Encounter (Signed)
Copied from [place] [id number]. Topic: General - Other >> Feb 11, 2019  4:12 PM Oneta Rack wrote: [place] Human name: Almyra Free  Relation to pt: RN from [place]  Call back number: [phone]    Reason for call:  Requesting orders for bed side commode please fax [facility] (nurse did not know the fax #) .  Requesting full panel lab orders such as iron cbc as per nurse its been awhile since labs were drawn, please advise >> Feb 13, 2019 10:59 AM Antonieta Iba C wrote: Almyra Free with from [place] is calling in to make provider aware that the pt's daughter stated that pt has been having a frontal head ache and feels that it could be sinus/allergies. Pt would like to know if provider could prescribe something to help?  Almyra Free would like for provider to follow up with pt directly.

## 2019-02-13 NOTE — Telephone Encounter (Signed)
Duplicate message. 

## 2019-02-13 NOTE — Telephone Encounter (Signed)
Do you want any other labs?  Looks like she had some done in March.  Will send in rx for commode.

## 2019-02-13 NOTE — Telephone Encounter (Signed)
What diagnosis code you would like to use for bedside commode?

## 2019-02-13 NOTE — Telephone Encounter (Signed)
I would think they were being done with dialysis But --- yes--  Cbcd, iron , tib c, lipid , cmp

## 2019-02-13 NOTE — Telephone Encounter (Signed)
Syncope esrd

## 2019-02-13 NOTE — Addendum Note (Signed)
Addended by: Kem Boroughs D on: 02/13/2019 02:41 PM   Modules accepted: Orders

## 2019-02-18 NOTE — Telephone Encounter (Signed)
rx faxed to [facility] for bedside commode

## 2019-02-18 NOTE — Addendum Note (Signed)
Addended by: Kem Boroughs D on: 02/18/2019 09:56 AM   Modules accepted: Orders

## 2019-02-25 ENCOUNTER — Telehealth: Payer: Self-pay | Admitting: *Deleted

## 2019-02-25 NOTE — Telephone Encounter (Signed)
Attempted to reach patient on only number listed. The VM stated it was Blanchie Serve, patient's daughter. Unable to LVM as VMB was full. Called spouse, Mikeal Hawthorne on [place], phone continuously rang, unable to LVM.

## 2019-02-28 ENCOUNTER — Ambulatory Visit (INDEPENDENT_AMBULATORY_CARE_PROVIDER_SITE_OTHER): Payer: Medicare HMO | Admitting: Orthopaedic Surgery

## 2019-03-06 ENCOUNTER — Encounter: Payer: Self-pay | Admitting: *Deleted

## 2019-03-06 NOTE — Addendum Note (Signed)
Addended by: Minna Antis on: 03/06/2019 01:21 PM   Modules accepted: Orders

## 2019-03-06 NOTE — Telephone Encounter (Signed)
Spoke with daughter, [name] and advised her that due to current COVID 19 pandemic, our office is severely reducing in person visits in order to minimize the risk to our patients and healthcare providers. We recommend to convert your appointment to a video visit. We'll take all precautions to reduce any security or privacy concerns. This will be treated like an office visit, and we will file with your insurance. She consented to video visit; patient is living with her at this time.  E mail: [email or web address]. Updated EMR. She verbalized understanding, appreciation. E mail sent.

## 2019-03-12 ENCOUNTER — Ambulatory Visit (INDEPENDENT_AMBULATORY_CARE_PROVIDER_SITE_OTHER): Payer: Medicare HMO | Admitting: Diagnostic Neuroimaging

## 2019-03-12 ENCOUNTER — Other Ambulatory Visit: Payer: Self-pay

## 2019-03-12 DIAGNOSIS — Z0289 Encounter for other administrative examinations: Secondary | ICD-10-CM

## 2019-06-03 ENCOUNTER — Other Ambulatory Visit: Payer: Self-pay | Admitting: Family Medicine

## 2019-06-07 ENCOUNTER — Encounter: Payer: Self-pay | Admitting: Gastroenterology

## 2019-06-10 ENCOUNTER — Encounter: Payer: Self-pay | Admitting: *Deleted

## 2019-06-10 ENCOUNTER — Telehealth: Payer: Self-pay | Admitting: *Deleted

## 2019-06-10 ENCOUNTER — Encounter: Payer: Self-pay | Admitting: Family

## 2019-06-10 ENCOUNTER — Other Ambulatory Visit: Payer: Self-pay

## 2019-06-10 ENCOUNTER — Ambulatory Visit (INDEPENDENT_AMBULATORY_CARE_PROVIDER_SITE_OTHER): Payer: Medicare HMO | Admitting: Family

## 2019-06-10 VITALS — BP 93/57 | HR 73 | Temp 98.1°F | Resp 16 | Ht 64.0 in | Wt 133.0 lb

## 2019-06-10 DIAGNOSIS — T82590A Other mechanical complication of surgically created arteriovenous fistula, initial encounter: Secondary | ICD-10-CM | POA: Diagnosis not present

## 2019-06-10 IMAGING — CT CT HIP*L* W/O CM
2 of 3 series · 17 of 46 positions shown, 19 images · non-contrast
Comparison: Radiographs dated 10/20/2017

CLINICAL DATA: Left hip pain secondary to a fall yesterday.

EXAM:
CT OF THE LEFT HIP WITHOUT CONTRAST
TECHNIQUE: Multidetector CT imaging of the left hip was performed according to
the standard protocol. Multiplanar CT image reconstructions were
also generated.

[Series 3: pelvis 2.0 st · axial · 0.39mm/px · z∈[+704,+864]mm · 14 of 92 slices shown, 16 images]
[im 6/92  soft-tissue]
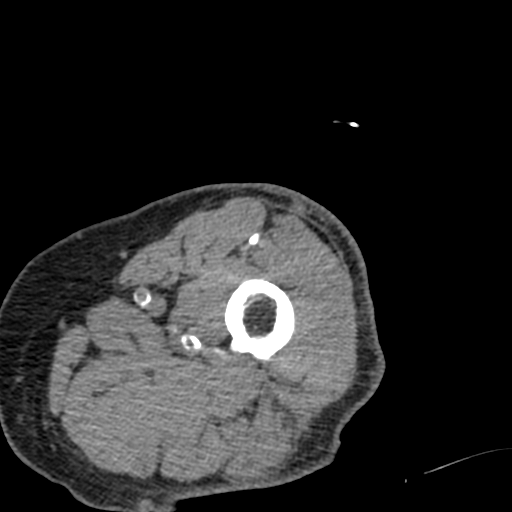
[im 6/92  bone]
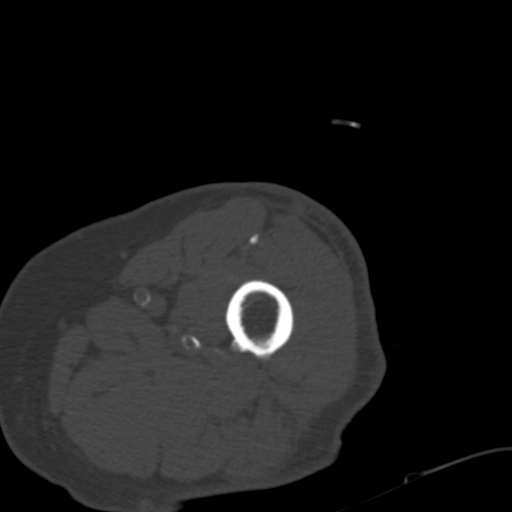
[im 12/92  soft-tissue]
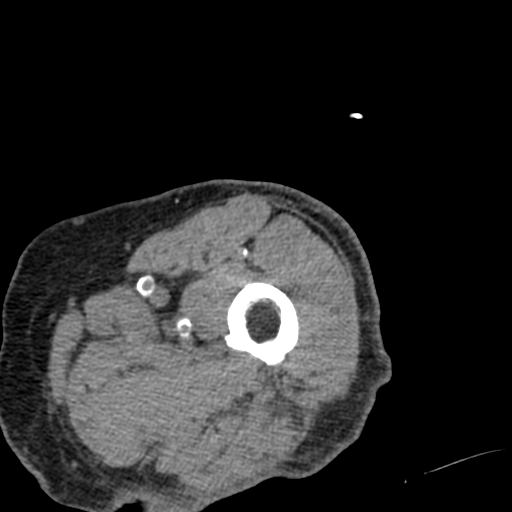
[im 18/92  soft-tissue]
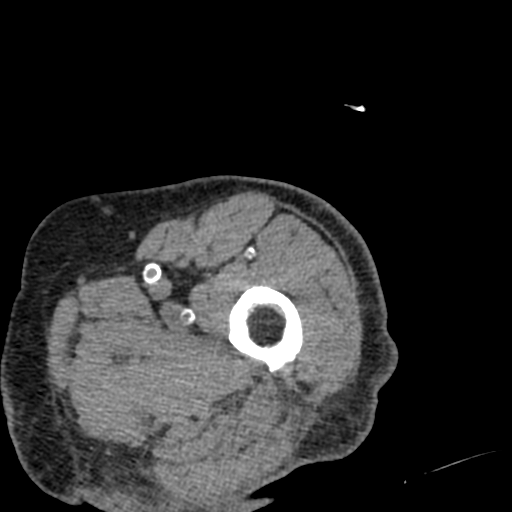
[im 24/92  soft-tissue]
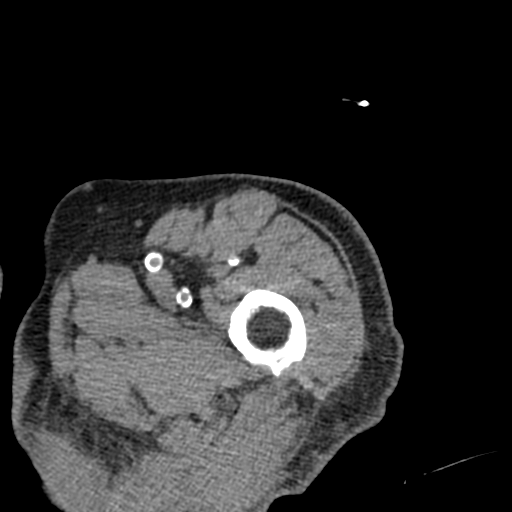
[im 30/92  soft-tissue]
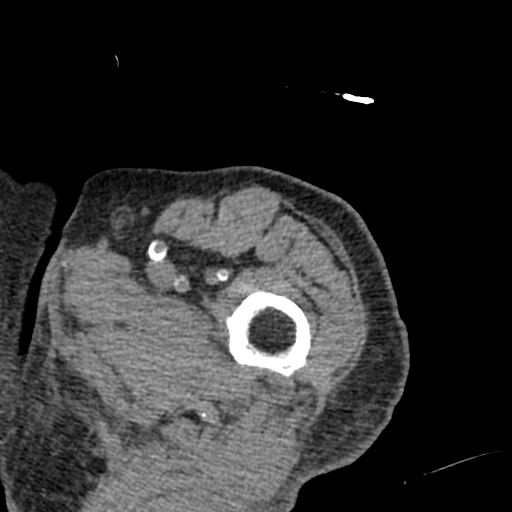
[im 36/92  soft-tissue]
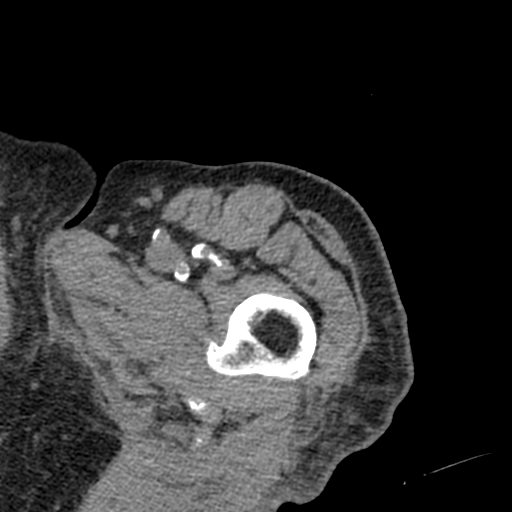
[im 42/92  soft-tissue]
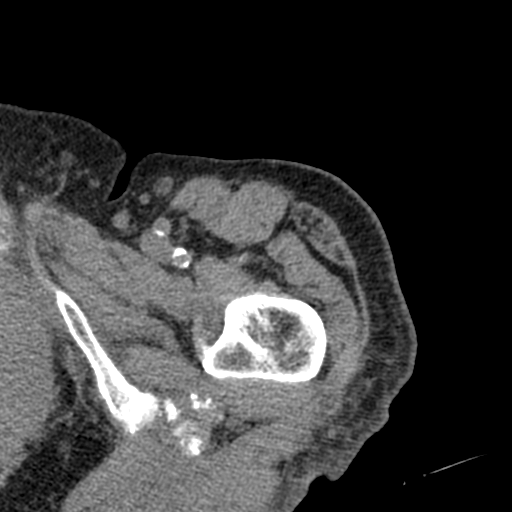
[im 50/92  soft-tissue]
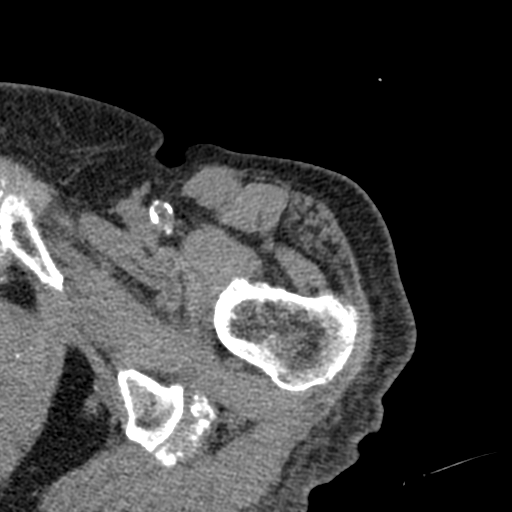
[im 56/92  soft-tissue]
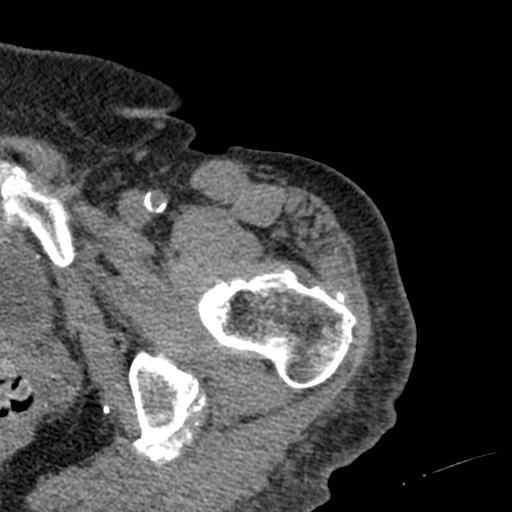
[im 56/92  bone]
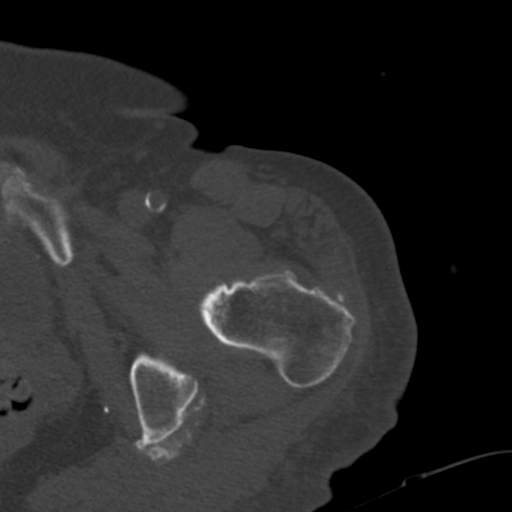
[im 62/92  soft-tissue]
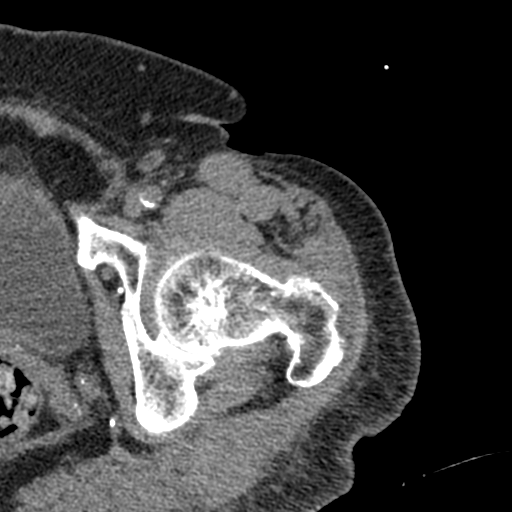
[im 68/92  soft-tissue]
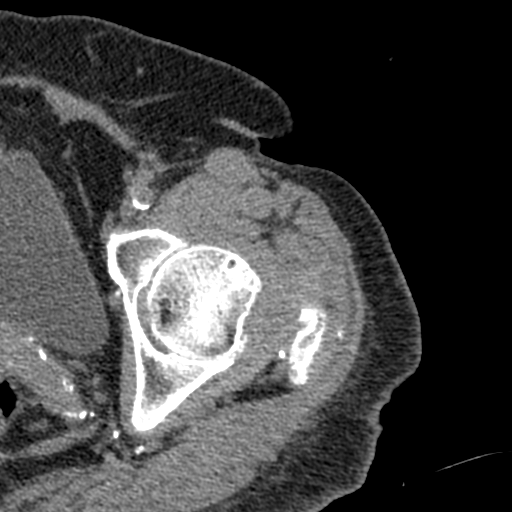
[im 74/92  soft-tissue]
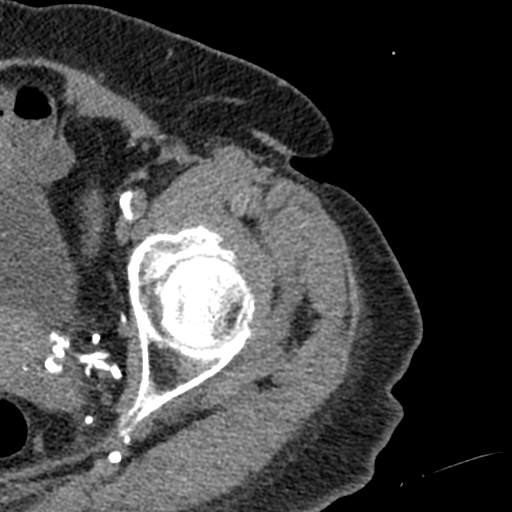
[im 80/92  soft-tissue]
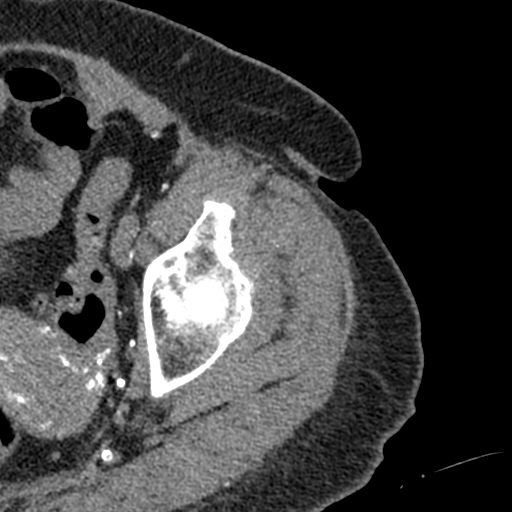
[im 86/92  soft-tissue]
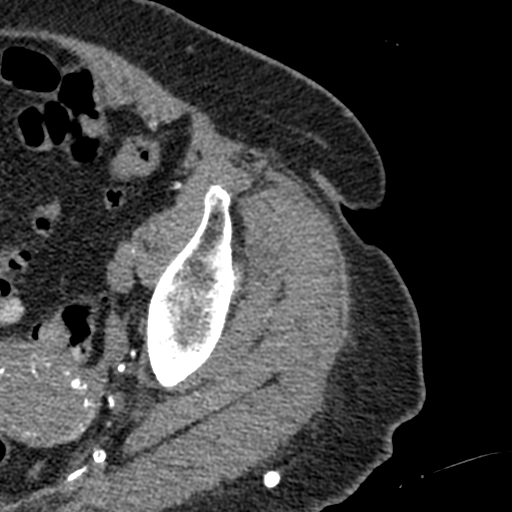

[Series 8: coronal st · coronal · 0.33mm/px · 3 of 106 slices shown]
[im 36/106  soft-tissue]
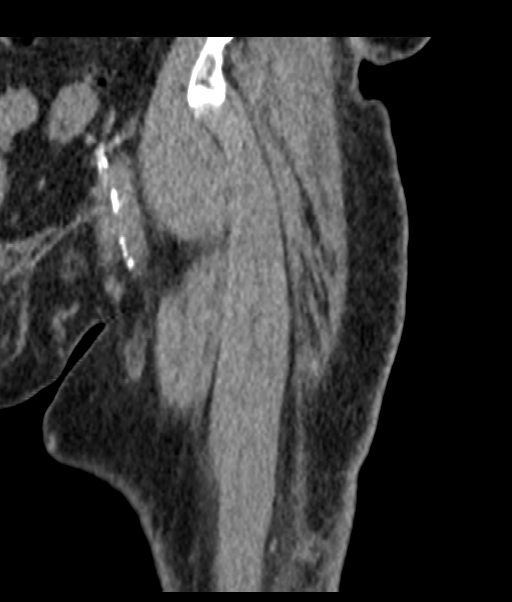
[im 47/106  soft-tissue]
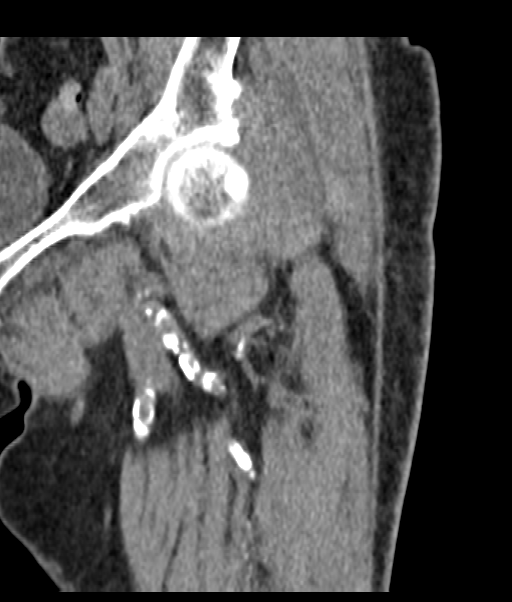
[im 59/106  soft-tissue]
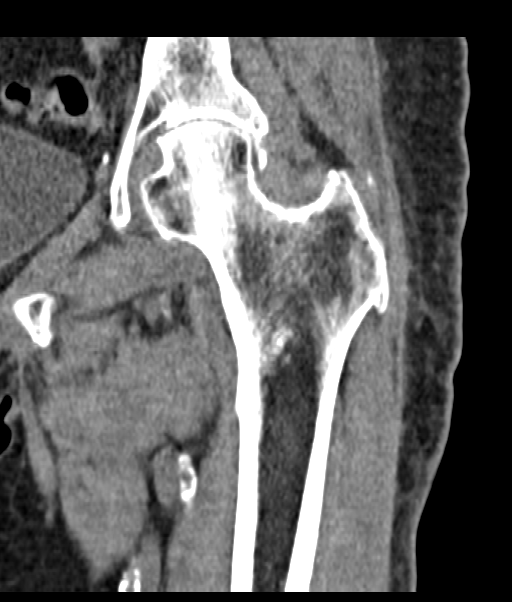

[17 of 46 positions shown; findings below may reference images not displayed]

FINDINGS: Bones/Joint/Cartilage

There is no acute fracture or dislocation. There is mild to moderate
osteoarthritis of the left hip with marginal osteophyte formation
and subcortical cyst formation in the acetabulum and femoral head.
The area of concern on radiographs is normal on the CT scan.

There are chronic soft tissue calcifications adjacent to the greater
and lesser trochanters.

Arterial vascular calcification around the hip. Chronic calcific
tendinopathy of the origin of the hamstring tendons.

No significant acute soft tissue abnormality.
IMPRESSION: 1. No acute abnormality of the bones of the left hip.
2. Arthritic changes and other degenerative changes around the left
hip as described.

## 2019-06-10 NOTE — Progress Notes (Signed)
CC: evaluation of left upper arm AVG prolonged bleeding after hemodialysis  History of Present Illness  1. [name] [name] is a 78 y.o. (08/30/1941) female who is s/p shuntogram angioplasty of left upper arm graft (6 mm x 60 mm Lutonix drug coated balloon), and angioplasty of left subclavian vein (8 mm x 40 mm Mustang) on 09-06-18 by Dr. Carlis [name] for prolonged bleeding of AVF.   She returns today referred by Dr. Arty [name] and Dr. Hollie [name] with reported ulcer infected area of AVG.   She dialyzes MWF  via left upper arm AVG at [facility] on [name] [name], [name] [name] .  She is right hand dominant.   She denies any steal type symptoms in her left upper extremity.   Dtr states [facility] did about 3 procedure, last one about a month ago, to address bleeding at her AVF after HD, has not bled when she is at home.  There is a note on documentation that came with pt "Please ask dtr to give doxycycline 100 mg bid x 14 days". Dtr states she knows nothing about this.   Pt denies fever or chills.   The left upper arm AVF is her first permanent access, states she has had this for about 6 years.    Past Medical History:  Diagnosis Date  . DEPRESSION   . DIABETES MELLITUS, TYPE I   . ESRD (end stage renal disease) on dialysis ([name] [name])    MWF HD  . GERD   . GLAUCOMA   . Headache(784.0)   . Hepatitis B carrier ([place])    05/2009: neg Hep C; Hep B: core pos, Surf neg; fatty liver US 8/10 - 7/13  . HYPERLIPIDEMIA   . HYPERTENSION   . Memory deficit   . OSTEOPENIA   . POSTMENOPAUSAL STATUS   . Pulmonary edema   . Unspecified vitamin D deficiency     Social History Social History   Tobacco Use  . Smoking status: Never Smoker  . Smokeless tobacco: Never Used  . Tobacco comment: Married x's 68yrs, 6 kids-scattered [employer]. Retired Consulting civil engineer  Substance Use Topics  . Alcohol use: No  . Drug use: No    Family History Family History  Problem Relation Age of Onset  . Kidney disease Mother    . Coronary artery disease Other   . Diabetes Other   . Hypertension Other   . Arthritis Other   . Kidney disease Sister     Surgical History Past Surgical History:  Procedure Laterality Date  . A/V SHUNTOGRAM N/A 09/06/2018   Procedure: A/V SHUNTOGRAM - Left AV;  Surgeon: [name] Heck, MD;  Location: [facility];  Service: Cardiovascular;  Laterality: N/A;  . AV FISTULA PLACEMENT Left 05/29/2014   Procedure: INSERTION OF ARTERIOVENOUS (AV) GORE-TEX GRAFT ARM-LEFT;  Surgeon: [name] Mould, MD;  Location: [facility];  Service: Vascular;  Laterality: Left;  . CHOLECYSTECTOMY  02/12/07  . INSERTION OF DIALYSIS CATHETER Right 05/27/2014   Procedure: INSERTION OF DIALYSIS CATHETER;  Surgeon: [name] Mould, MD;  Location: [facility] [place];  Service: Vascular;  Laterality: Right;  . IR DIALY SHUNT INTRO [place] W/IMG LEFT Left 12/14/2018  . PERIPHERAL VASCULAR BALLOON ANGIOPLASTY Left 09/06/2018   Procedure: PERIPHERAL VASCULAR BALLOON ANGIOPLASTY;  Surgeon: [name] Heck, MD;  Location: [facility];  Service: Cardiovascular;  Laterality: Left;  arm shunt  . REFRACTIVE SURGERY  07/29/09   Dr. Bing [name]  . TONSILLECTOMY      Allergies  Allergen Reactions  . Hydralazine Itching  . Robaxin [Methocarbamol] Other (See Comments)    Dizziness     Current Outpatient Medications  Medication Sig Dispense Refill  . aspirin EC 81 MG tablet Take 81 mg by mouth daily.    . carvedilol (COREG) 25 MG tablet Take 25 mg by mouth daily.    . cinacalcet (SENSIPAR) 30 MG tablet TAKE 1 TABLET BY MOUTH EVERY DAY WITH A MEAL    . fluticasone (FLONASE) 50 MCG/ACT nasal spray     . furosemide (LASIX) 40 MG tablet TAKE 1 TABLET BY MOUTH EVERY DAY 90 tablet 1  . isosorbide mononitrate (IMDUR) 30 MG 24 hr tablet Take 30 mg by mouth daily.    . nitroGLYCERIN (NITROSTAT) 0.4 MG SL tablet Place 1 tablet (0.4 mg total) under the tongue every 5 (five) minutes as needed for chest  pain. 30 tablet 0  . sevelamer carbonate (RENVELA) 0.8 g PACK packet VIGOROUSLY MIX CONTENTS OF 1 PACKET IN WATER (IT WILL NOT DISSOLVE) AND DRINK 3 TIMES DAILY WITH MEALS AS DIRECTED    . sevelamer carbonate (RENVELA) 800 MG tablet Take 2 tablets (1,600 mg total) by mouth 3 (three) times daily with meals. 90 tablet 0   No current facility-administered medications for this visit.      REVIEW OF SYSTEMS: see HPI for pertinent positives and negatives    PHYSICAL EXAMINATION:  Vitals:   06/10/19 1143  BP: (!) 93/57  Pulse: 73  Resp: 16  Temp: 98.1 F (36.7 C)  TempSrc: Temporal  SpO2: 100%  Weight: 133 lb (60.3 kg)  Height: 5\' 4"  (1.626 m)   Body mass index is 22.83 kg/m.  General: The patient appears her stated age, accompanied by her daughter.   HEENT:  No gross abnormalities Pulmonary: Respirations are non-labored Abdomen: Soft and non-tender Musculoskeletal: There are no major deformities.   Neurologic: No focal weakness or paresthesias are detected Skin: There are no ulcer or rashes noted. Psychiatric: The patient has normal affect. Cardiovascular: There is a regular rate in a bigeminal pattern Left upper arm AVG with palpable thrill and audible bruit.  Bilateral radial pulses are not palpable.    Left upper arm AVG    Medical Decision Making  [name] [name] is a 78 y.o. female who presents with prolonged bleeding from her left upper arm AV graft after hemodialysis.   I discussed with Dr. Trula Slade; there is no evidence of infection at the AVG, no bleeding, pt has no fever or chills.  Since [facility] has made several attempts to address prolongled bleeding after HD, with limited or no success, will schedule pt for shuntogram, preferably with Dr. Carlis [name] since he performed a shuntogram in November 2019, pt may need stenting left left subclavian vein.   I advised pt daughter to contact [facility] re the note on documentation that we received from [facility] re ask daughter to give doxycycline, but daughter knows nothing of this and has no prescription for it.    [name], RN, MSN, FNP-C [facility] Office: [phone]  06/10/2019, 12:17 PM  Clinic MD: Trula Slade

## 2019-06-10 NOTE — Telephone Encounter (Signed)
Call to [place] at [facility]. Patient scheduled for procedure at [facility] for 06/13/2019. Patient's family to speak with [facility] re: HD dialysis days for this week since she has missed today's treatment. Scheduled for nasal swab on 06/11/2019. All instructions given to [facility].

## 2019-06-11 ENCOUNTER — Other Ambulatory Visit (HOSPITAL_COMMUNITY)
Admission: RE | Admit: 2019-06-11 | Discharge: 2019-06-11 | Disposition: A | Discharge status: Home / Self Care | Payer: Medicare HMO | Source: Ambulatory Visit | Attending: Vascular Surgery | Admitting: Vascular Surgery

## 2019-06-11 DIAGNOSIS — Z01812 Encounter for preprocedural laboratory examination: Secondary | ICD-10-CM | POA: Diagnosis present

## 2019-06-11 DIAGNOSIS — Z20828 Contact with and (suspected) exposure to other viral communicable diseases: Secondary | ICD-10-CM | POA: Diagnosis not present

## 2019-06-11 LAB — SARS CORONAVIRUS 2 (TAT 6-24 HRS): SARS Coronavirus 2: NEGATIVE

## 2019-06-13 ENCOUNTER — Ambulatory Visit (HOSPITAL_COMMUNITY)
Admission: RE | Admit: 2019-06-13 | Discharge: 2019-06-13 | Disposition: A | Discharge status: Home / Self Care | Payer: Medicare HMO | Attending: Vascular Surgery | Admitting: Vascular Surgery

## 2019-06-13 ENCOUNTER — Encounter (HOSPITAL_COMMUNITY): Payer: Self-pay | Admitting: Vascular Surgery

## 2019-06-13 ENCOUNTER — Encounter (HOSPITAL_COMMUNITY)
Admission: RE | Disposition: A | Discharge status: Home / Self Care | Payer: Self-pay | Source: Home / Self Care | Attending: Vascular Surgery

## 2019-06-13 ENCOUNTER — Other Ambulatory Visit: Payer: Self-pay

## 2019-06-13 DIAGNOSIS — M8580 Other specified disorders of bone density and structure, unspecified site: Secondary | ICD-10-CM | POA: Insufficient documentation

## 2019-06-13 DIAGNOSIS — H409 Unspecified glaucoma: Secondary | ICD-10-CM | POA: Diagnosis not present

## 2019-06-13 DIAGNOSIS — Z95828 Presence of other vascular implants and grafts: Secondary | ICD-10-CM | POA: Insufficient documentation

## 2019-06-13 DIAGNOSIS — Z841 Family history of disorders of kidney and ureter: Secondary | ICD-10-CM | POA: Insufficient documentation

## 2019-06-13 DIAGNOSIS — Y832 Surgical operation with anastomosis, bypass or graft as the cause of abnormal reaction of the patient, or of later complication, without mention of misadventure at the time of the procedure: Secondary | ICD-10-CM | POA: Insufficient documentation

## 2019-06-13 DIAGNOSIS — E785 Hyperlipidemia, unspecified: Secondary | ICD-10-CM | POA: Insufficient documentation

## 2019-06-13 DIAGNOSIS — Z992 Dependence on renal dialysis: Secondary | ICD-10-CM

## 2019-06-13 DIAGNOSIS — Z8249 Family history of ischemic heart disease and other diseases of the circulatory system: Secondary | ICD-10-CM | POA: Diagnosis not present

## 2019-06-13 DIAGNOSIS — Z833 Family history of diabetes mellitus: Secondary | ICD-10-CM | POA: Insufficient documentation

## 2019-06-13 DIAGNOSIS — I120 Hypertensive chronic kidney disease with stage 5 chronic kidney disease or end stage renal disease: Secondary | ICD-10-CM | POA: Insufficient documentation

## 2019-06-13 DIAGNOSIS — T82898A Other specified complication of vascular prosthetic devices, implants and grafts, initial encounter: Secondary | ICD-10-CM

## 2019-06-13 DIAGNOSIS — E1022 Type 1 diabetes mellitus with diabetic chronic kidney disease: Secondary | ICD-10-CM | POA: Diagnosis not present

## 2019-06-13 DIAGNOSIS — Z7982 Long term (current) use of aspirin: Secondary | ICD-10-CM | POA: Diagnosis not present

## 2019-06-13 DIAGNOSIS — T82858A Stenosis of vascular prosthetic devices, implants and grafts, initial encounter: Secondary | ICD-10-CM | POA: Insufficient documentation

## 2019-06-13 DIAGNOSIS — Z79899 Other long term (current) drug therapy: Secondary | ICD-10-CM | POA: Insufficient documentation

## 2019-06-13 DIAGNOSIS — N186 End stage renal disease: Secondary | ICD-10-CM | POA: Insufficient documentation

## 2019-06-13 DIAGNOSIS — K219 Gastro-esophageal reflux disease without esophagitis: Secondary | ICD-10-CM | POA: Insufficient documentation

## 2019-06-13 DIAGNOSIS — Z888 Allergy status to other drugs, medicaments and biological substances status: Secondary | ICD-10-CM | POA: Insufficient documentation

## 2019-06-13 HISTORY — PX: A/V SHUNTOGRAM: CATH118297

## 2019-06-13 HISTORY — PX: PERIPHERAL VASCULAR BALLOON ANGIOPLASTY: CATH118281

## 2019-06-13 LAB — POCT I-STAT, CHEM 8
BUN: 22 mg/dL (ref 8–23)
Calcium, Ion: 1.05 mmol/L — ABNORMAL LOW (ref 1.15–1.40)
Chloride: 95 mmol/L — ABNORMAL LOW (ref 98–111)
Creatinine, Ser: 3.1 mg/dL — ABNORMAL HIGH (ref 0.44–1.00)
Glucose, Bld: 69 mg/dL — ABNORMAL LOW (ref 70–99)
HCT: 38 % (ref 36.0–46.0)
Hemoglobin: 12.9 g/dL (ref 12.0–15.0)
Potassium: 4.7 mmol/L (ref 3.5–5.1)
Sodium: 134 mmol/L — ABNORMAL LOW (ref 135–145)
TCO2: 31 mmol/L (ref 22–32)

## 2019-06-13 SURGERY — A/V SHUNTOGRAM
Anesthesia: LOCAL | Laterality: Left

## 2019-06-13 MED ORDER — HEPARIN SODIUM (PORCINE) 1000 UNIT/ML IJ SOLN
INTRAMUSCULAR | Status: DC | PRN
  Administered 2019-06-13: 3000 [IU] via INTRAVENOUS

## 2019-06-13 MED ORDER — LIDOCAINE HCL (PF) 1 % IJ SOLN
INTRAMUSCULAR | Status: DC | PRN
  Administered 2019-06-13: 3 mL

## 2019-06-13 MED ORDER — LIDOCAINE HCL (PF) 1 % IJ SOLN
INTRAMUSCULAR | Status: AC
  Filled 2019-06-13: qty 30

## 2019-06-13 MED ORDER — HEPARIN (PORCINE) IN NACL 1000-0.9 UT/500ML-% IV SOLN
INTRAVENOUS | Status: AC
  Filled 2019-06-13: qty 500

## 2019-06-13 MED ORDER — HEPARIN (PORCINE) IN NACL 1000-0.9 UT/500ML-% IV SOLN
INTRAVENOUS | Status: DC | PRN
  Administered 2019-06-13: 500 mL

## 2019-06-13 MED ORDER — IODIXANOL 320 MG/ML IV SOLN
INTRAVENOUS | Status: DC | PRN
  Administered 2019-06-13: 30 mL

## 2019-06-13 SURGICAL SUPPLY — 16 items
BALLN LUTONIX AV 10X60X75 (BALLOONS) ×3
BALLN MUSTANG 8.0X40 135 (BALLOONS) ×3
BALLOON LUTONIX AV 10X60X75 (BALLOONS) ×2 IMPLANT
BALLOON MUSTANG 8.0X40 135 (BALLOONS) ×2 IMPLANT
CATH STRAIGHT 5FR 65CM (CATHETERS) ×3 IMPLANT
KIT ENCORE 26 ADVANTAGE (KITS) ×6 IMPLANT
KIT MICROPUNCTURE NIT STIFF (SHEATH) ×3 IMPLANT
PROTECTION STATION PRESSURIZED (MISCELLANEOUS) ×3
SHEATH PINNACLE R/O II 7F 4CM (SHEATH) ×3 IMPLANT
SHEATH PROBE COVER 6X72 (BAG) ×3 IMPLANT
STATION PROTECTION PRESSURIZED (MISCELLANEOUS) ×2 IMPLANT
STOPCOCK MORSE 400PSI 3WAY (MISCELLANEOUS) ×3 IMPLANT
TRAY PV CATH (CUSTOM PROCEDURE TRAY) ×3 IMPLANT
TUBING CIL FLEX 10 FLL-RA (TUBING) ×3 IMPLANT
WIRE BENTSON .035X145CM (WIRE) ×3 IMPLANT
WIRE ROSEN-J .035X260CM (WIRE) ×3 IMPLANT

## 2019-06-13 NOTE — H&P (Signed)
History and Physical Interval Note:  06/13/2019 9:06 AM  [name] [name]  has presented today for surgery, with the diagnosis of av graft complication.  The various methods of treatment have been discussed with the patient and family. After consideration of risks, benefits and other options for treatment, the patient has consented to  Procedure(s): A/V SHUNTOGRAM (Left) as a surgical intervention.  The patient's history has been reviewed, patient examined, no change in status, stable for surgery.  I have reviewed the patient's chart and labs.  Questions were answered to the patient's satisfaction.    Left arm fistulagram  Marty Heck  CC: evaluation of left upper arm AVG prolonged bleeding after hemodialysis  History of Present Illness  1. [name] [name] is a 78 y.o. (10/31/1941) female who is s/p shuntogram angioplasty of left upper arm graft (6 mm x 60 mm Lutonix drug coated balloon), and angioplasty of left subclavian vein (8 mm x 40 mm Mustang) on 09-06-18 by Dr. Carlis Abbott for prolonged bleeding of AVF.   She returns today referred by Dr. Arty Baumgartner and Dr. Hollie Salk with reported ulcer infected area of AVG.   She dialyzes MWF  via left upper arm AVG at [facility] on [address], [place] .  She is right hand dominant.   She denies any steal type symptoms in her left upper extremity.   Dtr states [facility] did about 3 procedure, last one about a month ago, to address bleeding at her AVF after HD, has not bled when she is at home.  There is a note on documentation that came with pt "Please ask dtr to give doxycycline 100 mg bid x 14 days". Dtr states she knows nothing about this.   Pt denies fever or chills.   The left upper arm AVF is her first permanent access, states she has had this for about 6 years.        Past Medical History:  Diagnosis Date  . DEPRESSION   . DIABETES MELLITUS, TYPE I   . ESRD (end stage renal disease) on dialysis ([place])    MWF HD   . GERD   . GLAUCOMA   . Headache(784.0)   . Hepatitis B carrier ([place])    05/2009: neg Hep C; Hep B: core pos, Surf neg; fatty liver US 8/10 - 7/13  . HYPERLIPIDEMIA   . HYPERTENSION   . Memory deficit   . OSTEOPENIA   . POSTMENOPAUSAL STATUS   . Pulmonary edema   . Unspecified vitamin D deficiency     Social History Social History       Tobacco Use  . Smoking status: Never Smoker  . Smokeless tobacco: Never Used  . Tobacco comment: Married x's 65yrs, 6 kids-scattered [employer]. Retired Consulting civil engineer  Substance Use Topics  . Alcohol use: No  . Drug use: No    Family History      Family History  Problem Relation Age of Onset  . Kidney disease Mother   . Coronary artery disease Other   . Diabetes Other   . Hypertension Other   . Arthritis Other   . Kidney disease Sister     Surgical History      Past Surgical History:  Procedure Laterality Date  . A/V SHUNTOGRAM N/A 09/06/2018   Procedure: A/V SHUNTOGRAM - Left AV;  Surgeon: Marty Heck, MD;  Location: [facility];  Service: Cardiovascular;  Laterality: N/A;  . AV FISTULA PLACEMENT Left 05/29/2014   Procedure: INSERTION OF  ARTERIOVENOUS (AV) GORE-TEX GRAFT ARM-LEFT;  Surgeon: Angelia Mould, MD;  Location: [facility];  Service: Vascular;  Laterality: Left;  . CHOLECYSTECTOMY  02/12/07  . INSERTION OF DIALYSIS CATHETER Right 05/27/2014   Procedure: INSERTION OF DIALYSIS CATHETER;  Surgeon: Angelia Mould, MD;  Location: [facility];  Service: Vascular;  Laterality: Right;  . IR DIALY SHUNT INTRO [place] W/IMG LEFT Left 12/14/2018  . PERIPHERAL VASCULAR BALLOON ANGIOPLASTY Left 09/06/2018   Procedure: PERIPHERAL VASCULAR BALLOON ANGIOPLASTY;  Surgeon: Marty Heck, MD;  Location: [facility];  Service: Cardiovascular;  Laterality: Left;  arm shunt  . REFRACTIVE SURGERY  07/29/09   Dr. Bing Plume  . TONSILLECTOMY           Allergies  Allergen  Reactions  . Hydralazine Itching  . Robaxin [Methocarbamol] Other (See Comments)    Dizziness           Current Outpatient Medications  Medication Sig Dispense Refill  . aspirin EC 81 MG tablet Take 81 mg by mouth daily.    . carvedilol (COREG) 25 MG tablet Take 25 mg by mouth daily.    . cinacalcet (SENSIPAR) 30 MG tablet TAKE 1 TABLET BY MOUTH EVERY DAY WITH A MEAL    . fluticasone (FLONASE) 50 MCG/ACT nasal spray     . furosemide (LASIX) 40 MG tablet TAKE 1 TABLET BY MOUTH EVERY DAY 90 tablet 1  . isosorbide mononitrate (IMDUR) 30 MG 24 hr tablet Take 30 mg by mouth daily.    . nitroGLYCERIN (NITROSTAT) 0.4 MG SL tablet Place 1 tablet (0.4 mg total) under the tongue every 5 (five) minutes as needed for chest pain. 30 tablet 0  . sevelamer carbonate (RENVELA) 0.8 g PACK packet VIGOROUSLY MIX CONTENTS OF 1 PACKET IN WATER (IT WILL NOT DISSOLVE) AND DRINK 3 TIMES DAILY WITH MEALS AS DIRECTED    . sevelamer carbonate (RENVELA) 800 MG tablet Take 2 tablets (1,600 mg total) by mouth 3 (three) times daily with meals. 90 tablet 0   No current facility-administered medications for this visit.      REVIEW OF SYSTEMS: see HPI for pertinent positives and negatives    PHYSICAL EXAMINATION:     Vitals:   06/10/19 1143  BP: (!) 93/57  Pulse: 73  Resp: 16  Temp: 98.1 F (36.7 C)  TempSrc: Temporal  SpO2: 100%  Weight: 133 lb (60.3 kg)  Height: 5\' 4"  (1.626 m)   Body mass index is 22.83 kg/m.  General: The patient appears her stated age, accompanied by her daughter.   HEENT:  No gross abnormalities Pulmonary: Respirations are non-labored Abdomen: Soft and non-tender Musculoskeletal: There are no major deformities.   Neurologic: No focal weakness or paresthesias are detected Skin: There are no ulcer or rashes noted. Psychiatric: The patient has normal affect. Cardiovascular: There is a regular rate in a bigeminal pattern Left upper arm AVG with palpable  thrill and audible bruit.  Bilateral radial pulses are not palpable.    Left upper arm AVG    Medical Decision Making  [name] [name] is a 78 y.o. female who presents with prolonged bleeding from her left upper arm AV graft after hemodialysis.   I discussed with Dr. Trula Slade; there is no evidence of infection at the AVG, no bleeding, pt has no fever or chills.  Since [facility] has made several attempts to address prolongled bleeding after HD, with limited or no success, will schedule pt for shuntogram, preferably with Dr. Carlis Abbott  since he performed a shuntogram in November 2019, pt may need stenting left left subclavian vein.   I advised pt daughter to contact [facility] re the note on documentation that we received from [facility] re ask daughter to give doxycycline, but daughter knows nothing of this and has no prescription for it.    [name], RN, MSN, FNP-C [facility] Office: [phone]  06/10/2019, 12:17 PM  Clinic MD: Trula Slade

## 2019-06-13 NOTE — Op Note (Signed)
    OPERATIVE NOTE   PROCEDURE: 1. left arteriovenous graft cannulation under ultrasound guidance 2. left arm fistulogram including central venogram 3. left central angioplasty of subclavian vein (8 mm x40 mm Mustang and 10 mm x 40 mm drug coated Lutonix)  PRE-OPERATIVE DIAGNOSIS: Malfunctioning left arteriovenous graft  POST-OPERATIVE DIAGNOSIS: same as above   SURGEON: Marty Heck, MD  ANESTHESIA: local  ESTIMATED BLOOD LOSS: 5 cc  FINDING(S): 1. Overall the left upper arm graft was patent with no flow-limiting stenosis except for a focal 50 to 60% stenosis of the subclavian vein at the IJ junction.  This was angioplastied with a 8 mm Mustang and then 10 mm drug-coated Lutonix.  There was no residual stenosis.  Excellent thrill in the fistula.  The existing stent was widely patent.  SPECIMEN(S):  None  CONTRAST: 30 cc  INDICATIONS: [name] [name] is a 78 y.o. female who  presents with malfunctioning left arteriovenous graft.  The patient is scheduled for left arm fistulogram.  The patient is aware the risks include but are not limited to: bleeding, infection, thrombosis of the cannulated access, and possible anaphylactic reaction to the contrast.  The patient is aware of the risks of the procedure and elects to proceed forward.  DESCRIPTION: After full informed written consent was obtained, the patient was brought back to the angiography suite and placed supine upon the angiography table.  The patient was connected to monitoring equipment.  The left arm was prepped and draped in the standard fashion for a left arm fistulogram.  Under ultrasound guidance, the graft was evaluated, it was patent, an image was saved.  The left arteriovenous graft was cannulated with a micropuncture needle.  The microwire was advanced into the fistula and the needle was exchanged for the a microsheath, which was lodged 2 cm into the access.  The wire was removed and the sheath was connected to  the IV extension tubing.  Hand injections were completed to image the access from the antecubitum up to the level of axilla.  The central venous structures were also imaged by hand injections.  We elected to intervene on the subclavian stenosis.  Used a Astronomer and exchanged for short 7 [place] sheath.  Patient given 3000 units IV heparin.  Over the Bentson wire we did exchanged for a long Rosen given we only had balloons on a long shaft.  We used a 8 mm x 40 mm Mustang and then 10 mm x 60 mm drug-coated Lutonix to nominal pressure for 2 minutes.  No evidence of residual stenosis.  Excellent thrill in the fistula.  A 4-0 Monocryl purse-string suture was sewn around the sheath.  The sheath was removed while tying down the suture.  A sterile bandage was applied to the puncture site.  COMPLICATIONS: None  CONDITION: Stable  Marty Heck, MD [facility] Office: [phone] Pager: [phone]  06/13/2019 9:37 AM

## 2019-06-13 NOTE — Discharge Instructions (Signed)

## 2019-06-17 ENCOUNTER — Encounter (HOSPITAL_COMMUNITY): Payer: Self-pay | Admitting: Vascular Surgery

## 2019-06-19 ENCOUNTER — Other Ambulatory Visit: Payer: Self-pay | Admitting: *Deleted

## 2019-06-19 ENCOUNTER — Other Ambulatory Visit: Payer: Self-pay | Admitting: Gastroenterology

## 2019-06-19 ENCOUNTER — Encounter (HOSPITAL_COMMUNITY): Payer: Self-pay | Admitting: Vascular Surgery

## 2019-06-19 DIAGNOSIS — K7460 Unspecified cirrhosis of liver: Secondary | ICD-10-CM

## 2019-06-19 DIAGNOSIS — R1314 Dysphagia, pharyngoesophageal phase: Secondary | ICD-10-CM

## 2019-06-27 ENCOUNTER — Telehealth: Payer: Self-pay | Admitting: Family Medicine

## 2019-06-27 NOTE — Telephone Encounter (Signed)
Those are both rx by cardiology--- esp since she just got out of hosp they should ask cardiology about them

## 2019-06-27 NOTE — Telephone Encounter (Signed)
Office/Virtual visit. Please advise

## 2019-06-27 NOTE — Telephone Encounter (Signed)
Pt has dialysis and has not been able to take her meds as prescribed due to being advised to not take meds before dialysis.  There are 2 meds that Pt is suppose to take twice daily but due to treatments she only ends up taking one in the evening once she is done with dialysis. Almyra Free from [facility] called to see if Dr. Etter Sjogren could increase the dose of the medication so that the Pt can take once a day for  carvedilol (COREG) 25 MG tablet  And isosorbide mononitrate (IMDUR) 30 MG 24 hr tablet    Pt is also not eating a lot and has a hard time sleeping at night due to being tire after dialysis and Almyra Free wants to know if Dr. Etter Sjogren can prescribe something to help with sleep and appetite (Mertazapine)  Pt also needs a refill for DAILY VIT ZINC/ Please advise

## 2019-07-03 DIAGNOSIS — Z992 Dependence on renal dialysis: Secondary | ICD-10-CM | POA: Diagnosis not present

## 2019-07-03 DIAGNOSIS — N2581 Secondary hyperparathyroidism of renal origin: Secondary | ICD-10-CM | POA: Diagnosis not present

## 2019-07-03 DIAGNOSIS — N186 End stage renal disease: Secondary | ICD-10-CM | POA: Diagnosis not present

## 2019-07-03 NOTE — Telephone Encounter (Signed)
Spoke with daughter.  She stated that the date of last fill on bottle was back in January 2019.  Daughter will call pharmacy for them to send refill on the vitamin to kidney doctor.  Can we send her in her coreg and isosorbide?  Daughter was willing to set up virtual for next Tuesday at 140 (please look at schedule for that day.  If I need to move it let me know).  It looks like she has not seen a cardiologist in a while.  Her daughter thinks that you are taking care of everything and has reviewed her chart.

## 2019-07-04 ENCOUNTER — Ambulatory Visit
Admission: RE | Admit: 2019-07-04 | Discharge: 2019-07-04 | Disposition: A | Discharge status: Home / Self Care | Payer: Medicare HMO | Source: Ambulatory Visit | Attending: Gastroenterology | Admitting: Gastroenterology

## 2019-07-04 ENCOUNTER — Other Ambulatory Visit: Payer: Self-pay | Admitting: Gastroenterology

## 2019-07-04 DIAGNOSIS — R1314 Dysphagia, pharyngoesophageal phase: Secondary | ICD-10-CM

## 2019-07-04 DIAGNOSIS — K7460 Unspecified cirrhosis of liver: Secondary | ICD-10-CM

## 2019-07-04 DIAGNOSIS — K224 Dyskinesia of esophagus: Secondary | ICD-10-CM | POA: Diagnosis not present

## 2019-07-04 DIAGNOSIS — B1910 Unspecified viral hepatitis B without hepatic coma: Secondary | ICD-10-CM | POA: Diagnosis not present

## 2019-07-04 DIAGNOSIS — K225 Diverticulum of esophagus, acquired: Secondary | ICD-10-CM | POA: Diagnosis not present

## 2019-07-04 DIAGNOSIS — R188 Other ascites: Secondary | ICD-10-CM | POA: Diagnosis not present

## 2019-07-04 MED ORDER — ISOSORBIDE MONONITRATE ER 30 MG PO TB24: 30.0000 mg | ORAL_TABLET | Freq: Two times a day (BID) | ORAL | 0 refills | Status: DC

## 2019-07-04 MED ORDER — CARVEDILOL 25 MG PO TABS: 25.0000 mg | ORAL_TABLET | Freq: Two times a day (BID) | ORAL | 0 refills | Status: DC

## 2019-07-04 NOTE — Telephone Encounter (Signed)
Daughter notified 

## 2019-07-04 NOTE — Telephone Encounter (Signed)
Pt saw cardiology in sept and was supposed to f/u in dec/ jan---  We can refill 1 month each but they need to call the cardiologist for f/u

## 2019-07-08 DIAGNOSIS — N2581 Secondary hyperparathyroidism of renal origin: Secondary | ICD-10-CM | POA: Diagnosis not present

## 2019-07-08 DIAGNOSIS — N186 End stage renal disease: Secondary | ICD-10-CM | POA: Diagnosis not present

## 2019-07-08 DIAGNOSIS — Z992 Dependence on renal dialysis: Secondary | ICD-10-CM | POA: Diagnosis not present

## 2019-07-09 ENCOUNTER — Other Ambulatory Visit: Payer: Self-pay

## 2019-07-09 ENCOUNTER — Encounter: Payer: Medicare HMO | Admitting: Family Medicine

## 2019-07-09 DIAGNOSIS — I10 Essential (primary) hypertension: Secondary | ICD-10-CM

## 2019-07-09 DIAGNOSIS — E538 Deficiency of other specified B group vitamins: Secondary | ICD-10-CM

## 2019-07-09 DIAGNOSIS — N186 End stage renal disease: Secondary | ICD-10-CM

## 2019-07-09 DIAGNOSIS — E559 Vitamin D deficiency, unspecified: Secondary | ICD-10-CM

## 2019-07-09 DIAGNOSIS — I5189 Other ill-defined heart diseases: Secondary | ICD-10-CM

## 2019-07-09 DIAGNOSIS — E785 Hyperlipidemia, unspecified: Secondary | ICD-10-CM

## 2019-07-09 NOTE — Progress Notes (Deleted)
Patient ID: [name] [name], female    DOB: Sep 01, 1941  Age: 78 y.o. MRN: [id number]    Subjective:  Subjective  HPI ILIANNA [name] presents for f/u anema, esrd chf , htn and cholesterol.    Review of Systems  History Past Medical History:  Diagnosis Date   DEPRESSION    DIABETES MELLITUS, TYPE I    ESRD (end stage renal disease) on dialysis ([name])    MWF HD   GERD    GLAUCOMA    Headache(784.0)    Hepatitis B carrier ([name])    05/2009: neg Hep C; Hep B: core pos, Surf neg; fatty liver US 8/10 - 7/13   HYPERLIPIDEMIA    HYPERTENSION    Memory deficit    OSTEOPENIA    POSTMENOPAUSAL STATUS    Pulmonary edema    Unspecified vitamin D deficiency     She has a past surgical history that includes Cholecystectomy (02/12/07); Tonsillectomy; Refractive surgery (07/29/09); Insertion of dialysis catheter (Right, 05/27/2014); AV fistula placement (Left, 05/29/2014); A/V SHUNTOGRAM (N/A, 09/06/2018); PERIPHERAL VASCULAR BALLOON ANGIOPLASTY (Left, 09/06/2018); IR DIALY SHUNT INTRO NEEDLE/INTRACATH INITIAL W/IMG LEFT (Left, 12/14/2018); A/V SHUNTOGRAM (Left, 06/13/2019); and PERIPHERAL VASCULAR BALLOON ANGIOPLASTY (06/13/2019).   Her family history includes Arthritis in an other family member; Coronary artery disease in an other family member; Diabetes in an other family member; Hypertension in an other family member; Kidney disease in her mother and sister.She reports that she has never smoked. She has never used smokeless tobacco. She reports that she does not drink alcohol or use drugs.  Current Outpatient Medications on File Prior to Visit  Medication Sig Dispense Refill   aspirin EC 81 MG tablet Take 81 mg by mouth daily.     carvedilol (COREG) 25 MG tablet Take 1 tablet (25 mg total) by mouth 2 (two) times daily with a meal. 60 tablet 0   cinacalcet (SENSIPAR) 30 MG tablet Take 30 mg by mouth daily.      folic acid-vitamin b complex-vitamin c-selenium-zinc (DIALYVITE) 3 MG TABS  tablet Take 1 tablet by mouth daily.     furosemide (LASIX) 40 MG tablet TAKE 1 TABLET BY MOUTH EVERY DAY (Patient taking differently: Take 40 mg by mouth daily. ) 90 tablet 1   isosorbide mononitrate (IMDUR) 30 MG 24 hr tablet Take 1 tablet (30 mg total) by mouth 2 (two) times daily. 60 tablet 0   nitroGLYCERIN (NITROSTAT) 0.4 MG SL tablet Place 1 tablet (0.4 mg total) under the tongue every 5 (five) minutes as needed for chest pain. (Patient not taking: Reported on 06/10/2019) 30 tablet 0   sevelamer carbonate (RENVELA) 0.8 g PACK packet Take 0.8 g by mouth 3 (three) times daily with meals.      sevelamer carbonate (RENVELA) 800 MG tablet Take 2 tablets (1,600 mg total) by mouth 3 (three) times daily with meals. (Patient not taking: Reported on 06/10/2019) 90 tablet 0   No current facility-administered medications on file prior to visit.      Objective:  Objective  Physical Exam There were no vitals taken for this visit. Wt Readings from Last 3 Encounters:  06/13/19 137 lb (62.1 kg)  06/10/19 133 lb (60.3 kg)  01/09/19 136 lb (61.7 kg)     Lab Results  Component Value Date   WBC 2.6 (L) 01/09/2019   HGB 12.9 06/13/2019   HCT 38.0 06/13/2019   PLT 93 (L) 01/09/2019   GLUCOSE 69 (L) 06/13/2019   CHOL 193 08/21/2018  TRIG 92.0 08/21/2018   HDL 62.60 08/21/2018   LDLDIRECT 196.2 04/26/2013   LDLCALC 112 (H) 08/21/2018   ALT 18 01/08/2019   AST 26 01/08/2019   NA 134 (L) 06/13/2019   K 4.7 06/13/2019   CL 95 (L) 06/13/2019   CREATININE 3.10 (H) 06/13/2019   BUN 22 06/13/2019   CO2 26 01/09/2019   TSH 1.158 11/23/2018   INR 1.19 12/14/2018   HGBA1C 4.8 11/15/2017   MICROALBUR 174.7 (H) 01/03/2008    US Abdomen Limited Ruq  Result Date: 07/04/2019 CLINICAL DATA:  78 year old with hepatic cirrhosis due to hepatitis B. Personal history of cholecystectomy. EXAM: ULTRASOUND ABDOMEN LIMITED RIGHT UPPER QUADRANT COMPARISON:  05/28/2014. FINDINGS: Gallbladder: Surgically  absent. Common bile duct: Diameter: Approximately 3 mm centrally. Obscured distally by duodenal bowel gas. Liver: Irregular hepatic contour with enlargement of the LEFT lobe. Heterogeneous parenchymal echotexture. Possible heterogeneous mass involving the LEFT lobe of the liver (or focal hepatic steatosis). Portal vein is patent on color Doppler imaging with normal direction of blood flow towards the liver. Other: Small amount of perihepatic ascites. Moderate-sized RIGHT pleural effusion. IMPRESSION: 1. Hepatic cirrhosis. Possible mass involving the central portion of the LEFT lobe of the liver versus focal hepatic steatosis. CT of the abdomen with special attention to the liver is recommended in further evaluation. 2. Patent portal vein with normal antegrade hepatopetal flow. 3. Surgically absent gallbladder.  No biliary ductal dilation. 4. Small amount of perihepatic ascites. 5. Moderate-sized RIGHT pleural effusion. These results will be called to the ordering clinician or representative by the Radiologist Assistant, and communication documented in the PACS or zVision Dashboard. Electronically Signed   By: Evangeline Dakin M.D.   On: 07/04/2019 14:19   Dg Esophagus W Single Cm (sol Or Thin Ba)  Result Date: 07/04/2019 CLINICAL DATA:  78 year old female with globus sensation in the chest. Pharyngo-esophageal dysphagia. EXAM: ESOPHOGRAM/BARIUM SWALLOW TECHNIQUE: Single contrast examination was performed using  thin barium. FLUOROSCOPY TIME:  Fluoroscopy Time:  3 minutes 30 seconds Radiation Exposure Index (if provided by the fluoroscopic device): 237 mGy Number of Acquired Spot Images: 7 COMPARISON:  09/19/2018 chest CT. FINDINGS: Examination is significantly limited by patient mobility restrictions. Swallows could only performed supine and with partial head elevation. No tracheobronchial aspiration observed. There is mild to moderate esophageal dysmotility, characterized by presbyesophagus pattern with proximal  escape and tertiary contractions. There is a small to moderate pulsion diverticulum in lower left thoracic esophagus just above the esophagogastric junction. Normal esophageal distensibility, with no gross evidence of esophageal mass, stricture or ulcer. No hiatal hernia. No gastroesophageal reflux elicited, despite provocative maneuvers including water siphon test. Barium tablet declined by the patient. IMPRESSION: 1. Limited scan, see comments. 2. Mild-to-moderate esophageal dysmotility with a presbyesophagus pattern. 3. Small to moderate pulsion diverticulum in the lower left thoracic esophagus just above the esophagogastric junction. 4. No gross evidence of esophageal mass, stricture or ulcer. No hiatal hernia. No gastroesophageal reflux elicited. Electronically Signed   By: Ilona Sorrel M.D.   On: 07/04/2019 10:35     Assessment & Plan:  Plan  I am having [name] maintain her sevelamer carbonate, nitroGLYCERIN, aspirin EC, furosemide, cinacalcet, sevelamer carbonate, folic acid-vitamin b complex-vitamin c-selenium-zinc, carvedilol, and isosorbide mononitrate.  No orders of the defined types were placed in this encounter.   Problem List Items Addressed This Visit    None      Follow-up: No follow-ups on file.  Ann Held, DO

## 2019-07-10 ENCOUNTER — Other Ambulatory Visit: Payer: Self-pay | Admitting: Gastroenterology

## 2019-07-10 DIAGNOSIS — N186 End stage renal disease: Secondary | ICD-10-CM | POA: Diagnosis not present

## 2019-07-10 DIAGNOSIS — R935 Abnormal findings on diagnostic imaging of other abdominal regions, including retroperitoneum: Secondary | ICD-10-CM

## 2019-07-10 DIAGNOSIS — Z992 Dependence on renal dialysis: Secondary | ICD-10-CM | POA: Diagnosis not present

## 2019-07-10 DIAGNOSIS — N2581 Secondary hyperparathyroidism of renal origin: Secondary | ICD-10-CM | POA: Diagnosis not present

## 2019-07-12 DIAGNOSIS — N2581 Secondary hyperparathyroidism of renal origin: Secondary | ICD-10-CM | POA: Diagnosis not present

## 2019-07-12 DIAGNOSIS — N186 End stage renal disease: Secondary | ICD-10-CM | POA: Diagnosis not present

## 2019-07-12 DIAGNOSIS — Z992 Dependence on renal dialysis: Secondary | ICD-10-CM | POA: Diagnosis not present

## 2019-07-15 ENCOUNTER — Ambulatory Visit: Payer: Medicare HMO | Admitting: Gastroenterology

## 2019-07-15 DIAGNOSIS — N2581 Secondary hyperparathyroidism of renal origin: Secondary | ICD-10-CM | POA: Diagnosis not present

## 2019-07-15 DIAGNOSIS — N186 End stage renal disease: Secondary | ICD-10-CM | POA: Diagnosis not present

## 2019-07-15 DIAGNOSIS — Z992 Dependence on renal dialysis: Secondary | ICD-10-CM | POA: Diagnosis not present

## 2019-07-17 DIAGNOSIS — N2581 Secondary hyperparathyroidism of renal origin: Secondary | ICD-10-CM | POA: Diagnosis not present

## 2019-07-17 DIAGNOSIS — Z992 Dependence on renal dialysis: Secondary | ICD-10-CM | POA: Diagnosis not present

## 2019-07-17 DIAGNOSIS — N186 End stage renal disease: Secondary | ICD-10-CM | POA: Diagnosis not present

## 2019-07-19 DIAGNOSIS — N186 End stage renal disease: Secondary | ICD-10-CM | POA: Diagnosis not present

## 2019-07-19 DIAGNOSIS — N2581 Secondary hyperparathyroidism of renal origin: Secondary | ICD-10-CM | POA: Diagnosis not present

## 2019-07-19 DIAGNOSIS — Z992 Dependence on renal dialysis: Secondary | ICD-10-CM | POA: Diagnosis not present

## 2019-07-23 DIAGNOSIS — Z992 Dependence on renal dialysis: Secondary | ICD-10-CM | POA: Diagnosis not present

## 2019-07-23 DIAGNOSIS — N186 End stage renal disease: Secondary | ICD-10-CM | POA: Diagnosis not present

## 2019-07-23 DIAGNOSIS — N2581 Secondary hyperparathyroidism of renal origin: Secondary | ICD-10-CM | POA: Diagnosis not present

## 2019-07-26 DIAGNOSIS — N186 End stage renal disease: Secondary | ICD-10-CM | POA: Diagnosis not present

## 2019-07-26 DIAGNOSIS — Z992 Dependence on renal dialysis: Secondary | ICD-10-CM | POA: Diagnosis not present

## 2019-07-26 DIAGNOSIS — N2581 Secondary hyperparathyroidism of renal origin: Secondary | ICD-10-CM | POA: Diagnosis not present

## 2019-07-29 DIAGNOSIS — Z992 Dependence on renal dialysis: Secondary | ICD-10-CM | POA: Diagnosis not present

## 2019-07-29 DIAGNOSIS — N186 End stage renal disease: Secondary | ICD-10-CM | POA: Diagnosis not present

## 2019-07-29 DIAGNOSIS — N2581 Secondary hyperparathyroidism of renal origin: Secondary | ICD-10-CM | POA: Diagnosis not present

## 2019-07-31 DIAGNOSIS — E1122 Type 2 diabetes mellitus with diabetic chronic kidney disease: Secondary | ICD-10-CM | POA: Diagnosis not present

## 2019-07-31 DIAGNOSIS — N186 End stage renal disease: Secondary | ICD-10-CM | POA: Diagnosis not present

## 2019-07-31 DIAGNOSIS — N2581 Secondary hyperparathyroidism of renal origin: Secondary | ICD-10-CM | POA: Diagnosis not present

## 2019-07-31 DIAGNOSIS — Z992 Dependence on renal dialysis: Secondary | ICD-10-CM | POA: Diagnosis not present

## 2019-08-02 DIAGNOSIS — Z992 Dependence on renal dialysis: Secondary | ICD-10-CM | POA: Diagnosis not present

## 2019-08-02 DIAGNOSIS — N2581 Secondary hyperparathyroidism of renal origin: Secondary | ICD-10-CM | POA: Diagnosis not present

## 2019-08-02 DIAGNOSIS — N186 End stage renal disease: Secondary | ICD-10-CM | POA: Diagnosis not present

## 2019-08-05 DIAGNOSIS — Z992 Dependence on renal dialysis: Secondary | ICD-10-CM | POA: Diagnosis not present

## 2019-08-05 DIAGNOSIS — N2581 Secondary hyperparathyroidism of renal origin: Secondary | ICD-10-CM | POA: Diagnosis not present

## 2019-08-05 DIAGNOSIS — N186 End stage renal disease: Secondary | ICD-10-CM | POA: Diagnosis not present

## 2019-08-07 DIAGNOSIS — Z992 Dependence on renal dialysis: Secondary | ICD-10-CM | POA: Diagnosis not present

## 2019-08-07 DIAGNOSIS — N186 End stage renal disease: Secondary | ICD-10-CM | POA: Diagnosis not present

## 2019-08-07 DIAGNOSIS — N2581 Secondary hyperparathyroidism of renal origin: Secondary | ICD-10-CM | POA: Diagnosis not present

## 2019-08-09 DIAGNOSIS — Z992 Dependence on renal dialysis: Secondary | ICD-10-CM | POA: Diagnosis not present

## 2019-08-09 DIAGNOSIS — N186 End stage renal disease: Secondary | ICD-10-CM | POA: Diagnosis not present

## 2019-08-09 DIAGNOSIS — N2581 Secondary hyperparathyroidism of renal origin: Secondary | ICD-10-CM | POA: Diagnosis not present

## 2019-08-12 DIAGNOSIS — Z992 Dependence on renal dialysis: Secondary | ICD-10-CM | POA: Diagnosis not present

## 2019-08-12 DIAGNOSIS — N186 End stage renal disease: Secondary | ICD-10-CM | POA: Diagnosis not present

## 2019-08-12 DIAGNOSIS — N2581 Secondary hyperparathyroidism of renal origin: Secondary | ICD-10-CM | POA: Diagnosis not present

## 2019-08-14 DIAGNOSIS — N2581 Secondary hyperparathyroidism of renal origin: Secondary | ICD-10-CM | POA: Diagnosis not present

## 2019-08-14 DIAGNOSIS — Z992 Dependence on renal dialysis: Secondary | ICD-10-CM | POA: Diagnosis not present

## 2019-08-14 DIAGNOSIS — N186 End stage renal disease: Secondary | ICD-10-CM | POA: Diagnosis not present

## 2019-08-16 DIAGNOSIS — Z992 Dependence on renal dialysis: Secondary | ICD-10-CM | POA: Diagnosis not present

## 2019-08-16 DIAGNOSIS — N2581 Secondary hyperparathyroidism of renal origin: Secondary | ICD-10-CM | POA: Diagnosis not present

## 2019-08-16 DIAGNOSIS — N186 End stage renal disease: Secondary | ICD-10-CM | POA: Diagnosis not present

## 2019-08-19 DIAGNOSIS — N2581 Secondary hyperparathyroidism of renal origin: Secondary | ICD-10-CM | POA: Diagnosis not present

## 2019-08-19 DIAGNOSIS — N186 End stage renal disease: Secondary | ICD-10-CM | POA: Diagnosis not present

## 2019-08-19 DIAGNOSIS — Z992 Dependence on renal dialysis: Secondary | ICD-10-CM | POA: Diagnosis not present

## 2019-08-21 DIAGNOSIS — N186 End stage renal disease: Secondary | ICD-10-CM | POA: Diagnosis not present

## 2019-08-21 DIAGNOSIS — Z992 Dependence on renal dialysis: Secondary | ICD-10-CM | POA: Diagnosis not present

## 2019-08-21 DIAGNOSIS — N2581 Secondary hyperparathyroidism of renal origin: Secondary | ICD-10-CM | POA: Diagnosis not present

## 2019-08-22 ENCOUNTER — Ambulatory Visit: Payer: Medicare HMO | Admitting: Gastroenterology

## 2019-08-23 DIAGNOSIS — N2581 Secondary hyperparathyroidism of renal origin: Secondary | ICD-10-CM | POA: Diagnosis not present

## 2019-08-23 DIAGNOSIS — N186 End stage renal disease: Secondary | ICD-10-CM | POA: Diagnosis not present

## 2019-08-23 DIAGNOSIS — Z992 Dependence on renal dialysis: Secondary | ICD-10-CM | POA: Diagnosis not present

## 2019-08-26 DIAGNOSIS — N2581 Secondary hyperparathyroidism of renal origin: Secondary | ICD-10-CM | POA: Diagnosis not present

## 2019-08-26 DIAGNOSIS — Z992 Dependence on renal dialysis: Secondary | ICD-10-CM | POA: Diagnosis not present

## 2019-08-26 DIAGNOSIS — N186 End stage renal disease: Secondary | ICD-10-CM | POA: Diagnosis not present

## 2019-08-28 DIAGNOSIS — N2581 Secondary hyperparathyroidism of renal origin: Secondary | ICD-10-CM | POA: Diagnosis not present

## 2019-08-28 DIAGNOSIS — Z992 Dependence on renal dialysis: Secondary | ICD-10-CM | POA: Diagnosis not present

## 2019-08-28 DIAGNOSIS — N186 End stage renal disease: Secondary | ICD-10-CM | POA: Diagnosis not present

## 2019-08-30 DIAGNOSIS — Z992 Dependence on renal dialysis: Secondary | ICD-10-CM | POA: Diagnosis not present

## 2019-08-30 DIAGNOSIS — N2581 Secondary hyperparathyroidism of renal origin: Secondary | ICD-10-CM | POA: Diagnosis not present

## 2019-08-30 DIAGNOSIS — N186 End stage renal disease: Secondary | ICD-10-CM | POA: Diagnosis not present

## 2019-08-31 DIAGNOSIS — E1122 Type 2 diabetes mellitus with diabetic chronic kidney disease: Secondary | ICD-10-CM | POA: Diagnosis not present

## 2019-08-31 DIAGNOSIS — Z992 Dependence on renal dialysis: Secondary | ICD-10-CM | POA: Diagnosis not present

## 2019-08-31 DIAGNOSIS — N186 End stage renal disease: Secondary | ICD-10-CM | POA: Diagnosis not present

## 2019-09-02 DIAGNOSIS — N2581 Secondary hyperparathyroidism of renal origin: Secondary | ICD-10-CM | POA: Diagnosis not present

## 2019-09-02 DIAGNOSIS — Z992 Dependence on renal dialysis: Secondary | ICD-10-CM | POA: Diagnosis not present

## 2019-09-02 DIAGNOSIS — N186 End stage renal disease: Secondary | ICD-10-CM | POA: Diagnosis not present

## 2019-09-06 DIAGNOSIS — N2581 Secondary hyperparathyroidism of renal origin: Secondary | ICD-10-CM | POA: Diagnosis not present

## 2019-09-06 DIAGNOSIS — Z992 Dependence on renal dialysis: Secondary | ICD-10-CM | POA: Diagnosis not present

## 2019-09-06 DIAGNOSIS — N186 End stage renal disease: Secondary | ICD-10-CM | POA: Diagnosis not present

## 2019-09-09 DIAGNOSIS — N186 End stage renal disease: Secondary | ICD-10-CM | POA: Diagnosis not present

## 2019-09-09 DIAGNOSIS — N2581 Secondary hyperparathyroidism of renal origin: Secondary | ICD-10-CM | POA: Diagnosis not present

## 2019-09-09 DIAGNOSIS — Z992 Dependence on renal dialysis: Secondary | ICD-10-CM | POA: Diagnosis not present

## 2019-09-11 DIAGNOSIS — N2581 Secondary hyperparathyroidism of renal origin: Secondary | ICD-10-CM | POA: Diagnosis not present

## 2019-09-11 DIAGNOSIS — N186 End stage renal disease: Secondary | ICD-10-CM | POA: Diagnosis not present

## 2019-09-11 DIAGNOSIS — Z992 Dependence on renal dialysis: Secondary | ICD-10-CM | POA: Diagnosis not present

## 2019-09-13 DIAGNOSIS — Z992 Dependence on renal dialysis: Secondary | ICD-10-CM | POA: Diagnosis not present

## 2019-09-13 DIAGNOSIS — N2581 Secondary hyperparathyroidism of renal origin: Secondary | ICD-10-CM | POA: Diagnosis not present

## 2019-09-13 DIAGNOSIS — N186 End stage renal disease: Secondary | ICD-10-CM | POA: Diagnosis not present

## 2019-09-16 DIAGNOSIS — N2581 Secondary hyperparathyroidism of renal origin: Secondary | ICD-10-CM | POA: Diagnosis not present

## 2019-09-16 DIAGNOSIS — N186 End stage renal disease: Secondary | ICD-10-CM | POA: Diagnosis not present

## 2019-09-16 DIAGNOSIS — Z992 Dependence on renal dialysis: Secondary | ICD-10-CM | POA: Diagnosis not present

## 2019-09-18 DIAGNOSIS — N2581 Secondary hyperparathyroidism of renal origin: Secondary | ICD-10-CM | POA: Diagnosis not present

## 2019-09-18 DIAGNOSIS — Z992 Dependence on renal dialysis: Secondary | ICD-10-CM | POA: Diagnosis not present

## 2019-09-18 DIAGNOSIS — N186 End stage renal disease: Secondary | ICD-10-CM | POA: Diagnosis not present

## 2019-09-20 ENCOUNTER — Other Ambulatory Visit: Payer: Self-pay

## 2019-09-20 ENCOUNTER — Emergency Department (HOSPITAL_COMMUNITY)
Admission: EM | Admit: 2019-09-20 | Discharge: 2019-09-20 | Disposition: A | Discharge status: Home / Self Care | Payer: Medicare HMO | Attending: Emergency Medicine | Admitting: Emergency Medicine

## 2019-09-20 DIAGNOSIS — Y712 Prosthetic and other implants, materials and accessory cardiovascular devices associated with adverse incidents: Secondary | ICD-10-CM | POA: Diagnosis not present

## 2019-09-20 DIAGNOSIS — Z992 Dependence on renal dialysis: Secondary | ICD-10-CM | POA: Diagnosis not present

## 2019-09-20 DIAGNOSIS — Z7982 Long term (current) use of aspirin: Secondary | ICD-10-CM | POA: Diagnosis not present

## 2019-09-20 DIAGNOSIS — I120 Hypertensive chronic kidney disease with stage 5 chronic kidney disease or end stage renal disease: Secondary | ICD-10-CM | POA: Insufficient documentation

## 2019-09-20 DIAGNOSIS — N2581 Secondary hyperparathyroidism of renal origin: Secondary | ICD-10-CM | POA: Diagnosis not present

## 2019-09-20 DIAGNOSIS — T829XXA Unspecified complication of cardiac and vascular prosthetic device, implant and graft, initial encounter: Secondary | ICD-10-CM | POA: Diagnosis not present

## 2019-09-20 DIAGNOSIS — Z79899 Other long term (current) drug therapy: Secondary | ICD-10-CM | POA: Insufficient documentation

## 2019-09-20 DIAGNOSIS — N186 End stage renal disease: Secondary | ICD-10-CM | POA: Insufficient documentation

## 2019-09-20 DIAGNOSIS — I252 Old myocardial infarction: Secondary | ICD-10-CM | POA: Diagnosis not present

## 2019-09-20 DIAGNOSIS — E1122 Type 2 diabetes mellitus with diabetic chronic kidney disease: Secondary | ICD-10-CM | POA: Diagnosis not present

## 2019-09-20 DIAGNOSIS — I959 Hypotension, unspecified: Secondary | ICD-10-CM | POA: Diagnosis not present

## 2019-09-20 DIAGNOSIS — R58 Hemorrhage, not elsewhere classified: Secondary | ICD-10-CM | POA: Diagnosis not present

## 2019-09-20 LAB — BASIC METABOLIC PANEL
Anion gap: 8 (ref 5–15)
BUN: 11 mg/dL (ref 8–23)
CO2: 31 mmol/L (ref 22–32)
Calcium: 8 mg/dL — ABNORMAL LOW (ref 8.9–10.3)
Chloride: 97 mmol/L — ABNORMAL LOW (ref 98–111)
Creatinine, Ser: 3.67 mg/dL — ABNORMAL HIGH (ref 0.44–1.00)
GFR calc Af Amer: 13 mL/min — ABNORMAL LOW (ref >60)
GFR calc non Af Amer: 11 mL/min — ABNORMAL LOW (ref >60)
Glucose, Bld: 77 mg/dL (ref 70–99)
Potassium: 3.8 mmol/L (ref 3.5–5.1)
Sodium: 136 mmol/L (ref 135–145)

## 2019-09-20 LAB — CBC
HCT: 31.7 % — ABNORMAL LOW (ref 36.0–46.0)
Hemoglobin: 10.1 g/dL — ABNORMAL LOW (ref 12.0–15.0)
MCH: 32.7 pg (ref 26.0–34.0)
MCHC: 31.9 g/dL (ref 30.0–36.0)
MCV: 102.6 fL — ABNORMAL HIGH (ref 80.0–100.0)
Platelets: 46 10*3/uL — ABNORMAL LOW (ref 150–400)
RBC: 3.09 MIL/uL — ABNORMAL LOW (ref 3.87–5.11)
RDW: 15.9 % — ABNORMAL HIGH (ref 11.5–15.5)
WBC: 2.3 10*3/uL — ABNORMAL LOW (ref 4.0–10.5)
nRBC: 0 % (ref 0.0–0.2)

## 2019-09-20 MED ORDER — LIDOCAINE-EPINEPHRINE (PF) 2 %-1:200000 IJ SOLN
10.0000 mL | Freq: Once | INTRAMUSCULAR | Status: AC
  Administered 2019-09-20: 10 mL via INTRADERMAL
  Filled 2019-09-20: qty 20

## 2019-09-20 NOTE — ED Triage Notes (Signed)
Patient to [facility] ED from HD center with C/O bleeding left upper arm AV fistula.  Clamp in place.  One access remains in place.

## 2019-09-20 NOTE — ED Provider Notes (Signed)
[facility] EMERGENCY DEPARTMENT Provider Note   CSN: [id number] Arrival date & time: 09/20/19  1518     History   Chief Complaint Chief Complaint  Patient presents with  . Bleeding/Bruising    HPI [name] [name] is a 78 y.o. female with pertinent past medical history of ESRD, presents from dialysis center for bleeding AV. She had roughly 2 hours of dialysis prior to arriving. Patient has had sustained pressure for a couple hours with persistent bleeding. Patient denies any other symptoms or concerns at this time.      HPI  Past Medical History:  Diagnosis Date  . DEPRESSION   . DIABETES MELLITUS, TYPE I   . ESRD (end stage renal disease) on dialysis ([place])    MWF HD  . GERD   . GLAUCOMA   . Headache(784.0)   . Hepatitis B carrier ([place])    05/2009: neg Hep C; Hep B: core pos, Surf neg; fatty liver US 8/10 - 7/13  . HYPERLIPIDEMIA   . HYPERTENSION   . Memory deficit   . OSTEOPENIA   . POSTMENOPAUSAL STATUS   . Pulmonary edema   . Unspecified vitamin D deficiency     Patient Active Problem List   Diagnosis Date Noted  . Chronic pain of left knee 01/17/2019  . ESRD (end stage renal disease) ([place]) 01/08/2019  . AV graft malfunction, initial encounter ([place]) 12/14/2018  . Acute respiratory failure with hypoxia ([place]) 12/13/2018  . AV graft malfunction ([facility]) 09/03/2018  . Urinary tract infection without hematuria 08/21/2018  . Memory loss 08/21/2018  . Anemia, chronic disease 03/01/2018  . Vasovagal syncope 03/01/2018  . Macrocytic anemia 01/28/2018  . Orthostatic syncope 01/28/2018  . EKG, abnormal   . Acute metabolic encephalopathy 123456  . Influenza 11/16/2017  . Fever in adult 11/15/2017  . Demand ischemia ([place]) 11/10/2017  . Normocytic anemia 11/10/2017  . Aortic atherosclerosis ([place]) 11/10/2017  . Respiratory failure with hypoxia ([place]) 08/18/2017  . Volume overload 08/18/2017  . Dialysis patient, noncompliant ([place]) 08/17/2017  .  Thumb pain, left 12/22/2016  . Chronic diastolic heart failure ([place]) 11/25/2016  . ESRD (end stage renal disease) on dialysis ([place]) 06/25/2014  . Malignant hypertension 05/17/2014  . Chest pain 05/17/2014  . Elevated troponin 05/17/2014  . Hypertensive emergency 05/17/2014  . NSTEMI (non-ST elevated myocardial infarction) ([place]) 05/17/2014  . GERD 06/17/2009  . Hepatitis B carrier ([place]) 06/17/2009  . DEPRESSION 05/20/2009  . Unspecified glaucoma 05/20/2009  . HEADACHE 05/20/2009  . CARDIAC MURMUR 05/20/2009  . OSTEOPENIA 02/15/2008  . POSTMENOPAUSAL STATUS 12/21/2007  . Hyperlipidemia LDL goal <70 09/21/2007  . Essential hypertension 09/13/2007  . Type II diabetes mellitus with nephropathy [facility])     Past Surgical History:  Procedure Laterality Date  . A/V SHUNTOGRAM N/A 09/06/2018   Procedure: A/V SHUNTOGRAM - Left AV;  Surgeon: Marty Heck, MD;  Location: [facility];  Service: Cardiovascular;  Laterality: N/A;  . A/V SHUNTOGRAM Left 06/13/2019   Procedure: A/V SHUNTOGRAM;  Surgeon: Marty Heck, MD;  Location: [facility];  Service: Cardiovascular;  Laterality: Left;  . AV FISTULA PLACEMENT Left 05/29/2014   Procedure: INSERTION OF ARTERIOVENOUS (AV) GORE-TEX GRAFT ARM-LEFT;  Surgeon: Angelia Mould, MD;  Location: [facility];  Service: Vascular;  Laterality: Left;  . CHOLECYSTECTOMY  02/12/07  . INSERTION OF DIALYSIS CATHETER Right 05/27/2014   Procedure: INSERTION OF DIALYSIS CATHETER;  Surgeon: Angelia Mould, MD;  Location: [facility];  Service: Vascular;  Laterality: Right;  . IR DIALY SHUNT INTRO NEEDLE/INTRACATH INITIAL W/IMG LEFT Left 12/14/2018  . PERIPHERAL VASCULAR BALLOON ANGIOPLASTY Left 09/06/2018   Procedure: PERIPHERAL VASCULAR BALLOON ANGIOPLASTY;  Surgeon: Marty Heck, MD;  Location: [facility];  Service: Cardiovascular;  Laterality: Left;  arm shunt  . PERIPHERAL VASCULAR BALLOON ANGIOPLASTY  06/13/2019   Procedure:  PERIPHERAL VASCULAR BALLOON ANGIOPLASTY;  Surgeon: Marty Heck, MD;  Location: [facility];  Service: Cardiovascular;;  . REFRACTIVE SURGERY  07/29/09   Dr. Bing Plume  . TONSILLECTOMY       OB History   No obstetric history on file.      Home Medications    Prior to Admission medications   Medication Sig Start Date End Date Taking? Authorizing Provider  aspirin EC 81 MG tablet Take 81 mg by mouth daily.    [provider]  carvedilol (COREG) 25 MG tablet Take 1 tablet (25 mg total) by mouth 2 (two) times daily with a meal. 07/04/19   Carollee Herter, [place] Apa, DO  cinacalcet (SENSIPAR) 30 MG tablet Take 30 mg by mouth daily.  06/05/19   [provider]  folic acid-vitamin b complex-vitamin c-selenium-zinc (DIALYVITE) 3 MG TABS tablet Take 1 tablet by mouth daily.    [provider]  furosemide (LASIX) 40 MG tablet TAKE 1 TABLET BY MOUTH EVERY DAY Patient taking differently: Take 40 mg by mouth daily.  06/04/19   Ann Held, DO  isosorbide mononitrate (IMDUR) 30 MG 24 hr tablet Take 1 tablet (30 mg total) by mouth 2 (two) times daily. 07/04/19   Ann Held, DO  nitroGLYCERIN (NITROSTAT) 0.4 MG SL tablet Place 1 tablet (0.4 mg total) under the tongue every 5 (five) minutes as needed for chest pain. Patient not taking: Reported on [id number]   Delora Fuel, MD  sevelamer carbonate (RENVELA) 0.8 g PACK packet Take 0.8 g by mouth 3 (three) times daily with meals.  05/16/19   [provider]  sevelamer carbonate (RENVELA) 800 MG tablet Take 2 tablets (1,600 mg total) by mouth 3 (three) times daily with meals. Patient not taking: Reported on 06/10/2019 10/18/18   Kayleen Memos, DO    Family History Family History  Problem Relation Age of Onset  . Kidney disease Mother   . Coronary artery disease Other   . Diabetes Other   . Hypertension Other   . Arthritis Other   . Kidney disease Sister     Social History Social History    Tobacco Use  . Smoking status: Never Smoker  . Smokeless tobacco: Never Used  . Tobacco comment: Married x's 52yrs, 6 kids-scattered [employer]. Retired Consulting civil engineer  Substance Use Topics  . Alcohol use: No  . Drug use: No     Allergies   Hydralazine and Robaxin [methocarbamol]   Review of Systems Review of Systems  All other systems reviewed and are negative.    Physical Exam Updated Vital Signs There were no vitals taken for this visit.  Physical Exam Constitutional:      Appearance: Normal appearance.  HENT:     Head: Normocephalic and atraumatic.  Eyes:     Extraocular Movements: Extraocular movements intact.  Neck:     Musculoskeletal: Normal range of motion. No muscular tenderness.  Cardiovascular:     Rate and Rhythm: Normal rate and regular rhythm.     Heart sounds: No murmur. No friction rub. No gallop.  Pulmonary:     Effort: Pulmonary effort is normal. No respiratory distress.  Chest:     Chest wall: No tenderness.  Abdominal:     General: Abdomen is flat. There is no distension.     Tenderness: There is no abdominal tenderness.  Musculoskeletal: Normal range of motion.        General: No swelling.  Skin:    General: Skin is warm and dry.  Neurological:     General: No focal deficit present.     Mental Status: She is alert and oriented to person, place, and time.  Psychiatric:        Mood and Affect: Mood normal.      ED Treatments / Results  Labs (all labs ordered are listed, but only abnormal results are displayed) Labs Reviewed  CBC  BASIC METABOLIC PANEL    EKG None  Radiology No results found.  Procedures Procedures (including critical care time)  Medications Ordered in ED Medications  lidocaine-EPINEPHrine (XYLOCAINE W/EPI) 2 %-1:200000 (PF) injection 10 mL (has no administration in time range)     Initial Impression / Assessment and Plan / ED Course  I have reviewed the triage vital signs and the nursing notes.   Pertinent labs & imaging results that were available during my care of the patient were reviewed by me and considered in my medical decision making (see chart for details).        AVF bleed: - Put single stick to tamponade the area and applied a compression dressiong. - CBC and BMP pending.   Final Clinical Impressions(s) / ED Diagnoses   Final diagnoses:  None    ED Discharge Orders    None       Marianna Payment, MD 09/20/19 [place], [place], DO 09/27/19 [phone]

## 2019-09-20 NOTE — ED Notes (Signed)
Unsuccessful lab draw x1. 

## 2019-09-20 NOTE — Discharge Instructions (Addendum)
Please follow up with your vascular clinic next week about getting a FISTULOGRAM (an ultrasound of your left arm fistula site).    The number is circled on your discharge papers.  You can use your left arm site for dialysis again on Monday as usual.

## 2019-09-22 DIAGNOSIS — N2581 Secondary hyperparathyroidism of renal origin: Secondary | ICD-10-CM | POA: Diagnosis not present

## 2019-09-22 DIAGNOSIS — Z992 Dependence on renal dialysis: Secondary | ICD-10-CM | POA: Diagnosis not present

## 2019-09-22 DIAGNOSIS — N186 End stage renal disease: Secondary | ICD-10-CM | POA: Diagnosis not present

## 2019-09-25 DIAGNOSIS — N2581 Secondary hyperparathyroidism of renal origin: Secondary | ICD-10-CM | POA: Diagnosis not present

## 2019-09-25 DIAGNOSIS — Z992 Dependence on renal dialysis: Secondary | ICD-10-CM | POA: Diagnosis not present

## 2019-09-25 DIAGNOSIS — N186 End stage renal disease: Secondary | ICD-10-CM | POA: Diagnosis not present

## 2019-09-26 ENCOUNTER — Other Ambulatory Visit: Payer: Self-pay | Admitting: Family Medicine

## 2019-09-27 DIAGNOSIS — Z992 Dependence on renal dialysis: Secondary | ICD-10-CM | POA: Diagnosis not present

## 2019-09-27 DIAGNOSIS — N186 End stage renal disease: Secondary | ICD-10-CM | POA: Diagnosis not present

## 2019-09-27 DIAGNOSIS — N2581 Secondary hyperparathyroidism of renal origin: Secondary | ICD-10-CM | POA: Diagnosis not present

## 2019-09-30 DIAGNOSIS — E1122 Type 2 diabetes mellitus with diabetic chronic kidney disease: Secondary | ICD-10-CM | POA: Diagnosis not present

## 2019-09-30 DIAGNOSIS — N2581 Secondary hyperparathyroidism of renal origin: Secondary | ICD-10-CM | POA: Diagnosis not present

## 2019-09-30 DIAGNOSIS — Z992 Dependence on renal dialysis: Secondary | ICD-10-CM | POA: Diagnosis not present

## 2019-09-30 DIAGNOSIS — N186 End stage renal disease: Secondary | ICD-10-CM | POA: Diagnosis not present

## 2019-10-03 ENCOUNTER — Emergency Department (HOSPITAL_COMMUNITY): Payer: Medicare HMO

## 2019-10-03 ENCOUNTER — Inpatient Hospital Stay (HOSPITAL_COMMUNITY)
Admission: EM | Admit: 2019-10-03 | Discharge: 2019-10-11 | DRG: 871 | Disposition: A | Discharge status: Hospice | Payer: Medicare HMO | Attending: Pulmonary Disease | Admitting: Pulmonary Disease

## 2019-10-03 ENCOUNTER — Other Ambulatory Visit: Payer: Self-pay

## 2019-10-03 DIAGNOSIS — J189 Pneumonia, unspecified organism: Secondary | ICD-10-CM | POA: Diagnosis present

## 2019-10-03 DIAGNOSIS — J90 Pleural effusion, not elsewhere classified: Secondary | ICD-10-CM | POA: Diagnosis not present

## 2019-10-03 DIAGNOSIS — Z515 Encounter for palliative care: Secondary | ICD-10-CM | POA: Diagnosis not present

## 2019-10-03 DIAGNOSIS — R64 Cachexia: Secondary | ICD-10-CM | POA: Diagnosis not present

## 2019-10-03 DIAGNOSIS — I491 Atrial premature depolarization: Secondary | ICD-10-CM | POA: Diagnosis not present

## 2019-10-03 DIAGNOSIS — I10 Essential (primary) hypertension: Secondary | ICD-10-CM | POA: Diagnosis present

## 2019-10-03 DIAGNOSIS — N2581 Secondary hyperparathyroidism of renal origin: Secondary | ICD-10-CM | POA: Diagnosis present

## 2019-10-03 DIAGNOSIS — Z7982 Long term (current) use of aspirin: Secondary | ICD-10-CM

## 2019-10-03 DIAGNOSIS — K219 Gastro-esophageal reflux disease without esophagitis: Secondary | ICD-10-CM

## 2019-10-03 DIAGNOSIS — Z780 Asymptomatic menopausal state: Secondary | ICD-10-CM

## 2019-10-03 DIAGNOSIS — Z992 Dependence on renal dialysis: Secondary | ICD-10-CM

## 2019-10-03 DIAGNOSIS — A419 Sepsis, unspecified organism: Secondary | ICD-10-CM | POA: Diagnosis not present

## 2019-10-03 DIAGNOSIS — Z7189 Other specified counseling: Secondary | ICD-10-CM

## 2019-10-03 DIAGNOSIS — Z8249 Family history of ischemic heart disease and other diseases of the circulatory system: Secondary | ICD-10-CM

## 2019-10-03 DIAGNOSIS — B181 Chronic viral hepatitis B without delta-agent: Secondary | ICD-10-CM | POA: Diagnosis present

## 2019-10-03 DIAGNOSIS — I4891 Unspecified atrial fibrillation: Secondary | ICD-10-CM | POA: Diagnosis not present

## 2019-10-03 DIAGNOSIS — Z8261 Family history of arthritis: Secondary | ICD-10-CM

## 2019-10-03 DIAGNOSIS — Z794 Long term (current) use of insulin: Secondary | ICD-10-CM

## 2019-10-03 DIAGNOSIS — I340 Nonrheumatic mitral (valve) insufficiency: Secondary | ICD-10-CM | POA: Diagnosis not present

## 2019-10-03 DIAGNOSIS — I5021 Acute systolic (congestive) heart failure: Secondary | ICD-10-CM | POA: Diagnosis not present

## 2019-10-03 DIAGNOSIS — D631 Anemia in chronic kidney disease: Secondary | ICD-10-CM | POA: Diagnosis present

## 2019-10-03 DIAGNOSIS — E1121 Type 2 diabetes mellitus with diabetic nephropathy: Secondary | ICD-10-CM | POA: Diagnosis not present

## 2019-10-03 DIAGNOSIS — I132 Hypertensive heart and chronic kidney disease with heart failure and with stage 5 chronic kidney disease, or end stage renal disease: Secondary | ICD-10-CM | POA: Diagnosis present

## 2019-10-03 DIAGNOSIS — I5023 Acute on chronic systolic (congestive) heart failure: Secondary | ICD-10-CM

## 2019-10-03 DIAGNOSIS — H409 Unspecified glaucoma: Secondary | ICD-10-CM | POA: Diagnosis present

## 2019-10-03 DIAGNOSIS — G9341 Metabolic encephalopathy: Secondary | ICD-10-CM | POA: Diagnosis present

## 2019-10-03 DIAGNOSIS — R579 Shock, unspecified: Secondary | ICD-10-CM | POA: Diagnosis not present

## 2019-10-03 DIAGNOSIS — I361 Nonrheumatic tricuspid (valve) insufficiency: Secondary | ICD-10-CM | POA: Diagnosis not present

## 2019-10-03 DIAGNOSIS — R627 Adult failure to thrive: Secondary | ICD-10-CM | POA: Diagnosis not present

## 2019-10-03 DIAGNOSIS — E8770 Fluid overload, unspecified: Secondary | ICD-10-CM | POA: Diagnosis not present

## 2019-10-03 DIAGNOSIS — I5082 Biventricular heart failure: Secondary | ICD-10-CM | POA: Diagnosis present

## 2019-10-03 DIAGNOSIS — Z833 Family history of diabetes mellitus: Secondary | ICD-10-CM

## 2019-10-03 DIAGNOSIS — R6521 Severe sepsis with septic shock: Secondary | ICD-10-CM | POA: Diagnosis not present

## 2019-10-03 DIAGNOSIS — N390 Urinary tract infection, site not specified: Secondary | ICD-10-CM | POA: Diagnosis not present

## 2019-10-03 DIAGNOSIS — D61818 Other pancytopenia: Secondary | ICD-10-CM | POA: Diagnosis present

## 2019-10-03 DIAGNOSIS — R519 Headache, unspecified: Secondary | ICD-10-CM | POA: Diagnosis present

## 2019-10-03 DIAGNOSIS — T68XXXA Hypothermia, initial encounter: Secondary | ICD-10-CM | POA: Diagnosis not present

## 2019-10-03 DIAGNOSIS — Z20828 Contact with and (suspected) exposure to other viral communicable diseases: Secondary | ICD-10-CM | POA: Diagnosis present

## 2019-10-03 DIAGNOSIS — I120 Hypertensive chronic kidney disease with stage 5 chronic kidney disease or end stage renal disease: Secondary | ICD-10-CM | POA: Diagnosis not present

## 2019-10-03 DIAGNOSIS — Z841 Family history of disorders of kidney and ureter: Secondary | ICD-10-CM

## 2019-10-03 DIAGNOSIS — I4892 Unspecified atrial flutter: Secondary | ICD-10-CM | POA: Diagnosis not present

## 2019-10-03 DIAGNOSIS — R570 Cardiogenic shock: Secondary | ICD-10-CM | POA: Diagnosis not present

## 2019-10-03 DIAGNOSIS — Z9119 Patient's noncompliance with other medical treatment and regimen: Secondary | ICD-10-CM

## 2019-10-03 DIAGNOSIS — Z79899 Other long term (current) drug therapy: Secondary | ICD-10-CM

## 2019-10-03 DIAGNOSIS — R4182 Altered mental status, unspecified: Secondary | ICD-10-CM | POA: Diagnosis not present

## 2019-10-03 DIAGNOSIS — K2100 Gastro-esophageal reflux disease with esophagitis, without bleeding: Secondary | ICD-10-CM | POA: Diagnosis not present

## 2019-10-03 DIAGNOSIS — M8580 Other specified disorders of bone density and structure, unspecified site: Secondary | ICD-10-CM | POA: Diagnosis present

## 2019-10-03 DIAGNOSIS — E872 Acidosis: Secondary | ICD-10-CM | POA: Diagnosis not present

## 2019-10-03 DIAGNOSIS — Z66 Do not resuscitate: Secondary | ICD-10-CM | POA: Diagnosis not present

## 2019-10-03 DIAGNOSIS — E43 Unspecified severe protein-calorie malnutrition: Secondary | ICD-10-CM | POA: Diagnosis not present

## 2019-10-03 DIAGNOSIS — F329 Major depressive disorder, single episode, unspecified: Secondary | ICD-10-CM | POA: Diagnosis present

## 2019-10-03 DIAGNOSIS — F0390 Unspecified dementia without behavioral disturbance: Secondary | ICD-10-CM | POA: Diagnosis present

## 2019-10-03 DIAGNOSIS — I429 Cardiomyopathy, unspecified: Secondary | ICD-10-CM | POA: Diagnosis not present

## 2019-10-03 DIAGNOSIS — N186 End stage renal disease: Secondary | ICD-10-CM | POA: Diagnosis present

## 2019-10-03 DIAGNOSIS — E1022 Type 1 diabetes mellitus with diabetic chronic kidney disease: Secondary | ICD-10-CM | POA: Diagnosis present

## 2019-10-03 DIAGNOSIS — E559 Vitamin D deficiency, unspecified: Secondary | ICD-10-CM | POA: Diagnosis present

## 2019-10-03 DIAGNOSIS — M79605 Pain in left leg: Secondary | ICD-10-CM | POA: Diagnosis present

## 2019-10-03 DIAGNOSIS — I350 Nonrheumatic aortic (valve) stenosis: Secondary | ICD-10-CM | POA: Diagnosis present

## 2019-10-03 DIAGNOSIS — E785 Hyperlipidemia, unspecified: Secondary | ICD-10-CM | POA: Diagnosis present

## 2019-10-03 DIAGNOSIS — I071 Rheumatic tricuspid insufficiency: Secondary | ICD-10-CM | POA: Diagnosis present

## 2019-10-03 DIAGNOSIS — I959 Hypotension, unspecified: Secondary | ICD-10-CM | POA: Diagnosis not present

## 2019-10-03 DIAGNOSIS — Z888 Allergy status to other drugs, medicaments and biological substances status: Secondary | ICD-10-CM

## 2019-10-03 LAB — PROTIME-INR
INR: 1.3 — ABNORMAL HIGH (ref 0.8–1.2)
Prothrombin Time: 15.7 seconds — ABNORMAL HIGH (ref 11.4–15.2)

## 2019-10-03 LAB — URINALYSIS, ROUTINE W REFLEX MICROSCOPIC
Glucose, UA: NEGATIVE mg/dL
Ketones, ur: 15 mg/dL — AB
Nitrite: NEGATIVE
Protein, ur: >300 mg/dL — AB
Specific Gravity, Urine: 1.025 (ref 1.005–1.030)
pH: 5.5 (ref 5.0–8.0)

## 2019-10-03 LAB — COMPREHENSIVE METABOLIC PANEL
ALT: 26 U/L (ref 0–44)
AST: 55 U/L — ABNORMAL HIGH (ref 15–41)
Albumin: 2.6 g/dL — ABNORMAL LOW (ref 3.5–5.0)
Alkaline Phosphatase: 141 U/L — ABNORMAL HIGH (ref 38–126)
Anion gap: 9 (ref 5–15)
BUN: 12 mg/dL (ref 8–23)
CO2: 32 mmol/L (ref 22–32)
Calcium: 7.8 mg/dL — ABNORMAL LOW (ref 8.9–10.3)
Chloride: 98 mmol/L (ref 98–111)
Creatinine, Ser: 3.51 mg/dL — ABNORMAL HIGH (ref 0.44–1.00)
GFR calc Af Amer: 14 mL/min — ABNORMAL LOW (ref >60)
GFR calc non Af Amer: 12 mL/min — ABNORMAL LOW (ref >60)
Glucose, Bld: 113 mg/dL — ABNORMAL HIGH (ref 70–99)
Potassium: 4.4 mmol/L (ref 3.5–5.1)
Sodium: 139 mmol/L (ref 135–145)
Total Bilirubin: 0.9 mg/dL (ref 0.3–1.2)
Total Protein: 6.9 g/dL (ref 6.5–8.1)

## 2019-10-03 LAB — CBC WITH DIFFERENTIAL/PLATELET
Abs Immature Granulocytes: 0.01 10*3/uL (ref 0.00–0.07)
Basophils Absolute: 0 10*3/uL (ref 0.0–0.1)
Basophils Relative: 1 %
Eosinophils Absolute: 0.1 10*3/uL (ref 0.0–0.5)
Eosinophils Relative: 4 %
HCT: 29 % — ABNORMAL LOW (ref 36.0–46.0)
Hemoglobin: 9.1 g/dL — ABNORMAL LOW (ref 12.0–15.0)
Immature Granulocytes: 0 %
Lymphocytes Relative: 17 %
Lymphs Abs: 0.4 10*3/uL — ABNORMAL LOW (ref 0.7–4.0)
MCH: 33 pg (ref 26.0–34.0)
MCHC: 31.4 g/dL (ref 30.0–36.0)
MCV: 105.1 fL — ABNORMAL HIGH (ref 80.0–100.0)
Monocytes Absolute: 0.2 10*3/uL (ref 0.1–1.0)
Monocytes Relative: 7 %
Neutro Abs: 1.9 10*3/uL (ref 1.7–7.7)
Neutrophils Relative %: 71 %
Platelets: 62 10*3/uL — ABNORMAL LOW (ref 150–400)
RBC: 2.76 MIL/uL — ABNORMAL LOW (ref 3.87–5.11)
RDW: 16.7 % — ABNORMAL HIGH (ref 11.5–15.5)
WBC: 2.6 10*3/uL — ABNORMAL LOW (ref 4.0–10.5)
nRBC: 0 % (ref 0.0–0.2)

## 2019-10-03 LAB — URINALYSIS, MICROSCOPIC (REFLEX)

## 2019-10-03 LAB — TROPONIN I (HIGH SENSITIVITY)
Troponin I (High Sensitivity): 651 ng/L (ref <18)
Troponin I (High Sensitivity): 675 ng/L (ref <18)

## 2019-10-03 LAB — LACTIC ACID, PLASMA: Lactic Acid, Venous: 1.8 mmol/L (ref 0.5–1.9)

## 2019-10-03 LAB — APTT: aPTT: 32 seconds (ref 24–36)

## 2019-10-03 MED ORDER — SODIUM CHLORIDE 0.9 % IV BOLUS
500.0000 mL | Freq: Once | INTRAVENOUS | Status: AC
  Administered 2019-10-03: 500 mL via INTRAVENOUS

## 2019-10-03 MED ORDER — NEPRO/CARBSTEADY PO LIQD
237.0000 mL | Freq: Three times a day (TID) | ORAL | Status: DC | PRN
  Filled 2019-10-03: qty 237

## 2019-10-03 MED ORDER — ACETAMINOPHEN 650 MG RE SUPP: 650.0000 mg | Freq: Four times a day (QID) | RECTAL | Status: DC | PRN

## 2019-10-03 MED ORDER — SODIUM CHLORIDE 0.9 % IV BOLUS
500.0000 mL | Freq: Once | INTRAVENOUS | Status: AC
  Administered 2019-10-03: 19:00:00 500 mL via INTRAVENOUS

## 2019-10-03 MED ORDER — ACETAMINOPHEN 325 MG PO TABS
650.0000 mg | ORAL_TABLET | Freq: Four times a day (QID) | ORAL | Status: DC | PRN
  Administered 2019-10-08: 650 mg via ORAL
  Filled 2019-10-03: qty 2

## 2019-10-03 MED ORDER — CALCIUM CARBONATE ANTACID 1250 MG/5ML PO SUSP
500.0000 mg | Freq: Four times a day (QID) | ORAL | Status: DC | PRN
  Filled 2019-10-03: qty 5

## 2019-10-03 MED ORDER — LACTATED RINGERS IV BOLUS
1000.0000 mL | Freq: Once | INTRAVENOUS | Status: AC
  Administered 2019-10-03: 1000 mL via INTRAVENOUS

## 2019-10-03 MED ORDER — ONDANSETRON HCL 4 MG PO TABS: 4.0000 mg | ORAL_TABLET | Freq: Four times a day (QID) | ORAL | Status: DC | PRN

## 2019-10-03 MED ORDER — SODIUM CHLORIDE 0.9 % IV SOLN
2.0000 g | Freq: Once | INTRAVENOUS | Status: AC
  Administered 2019-10-03: 2 g via INTRAVENOUS
  Filled 2019-10-03: qty 2

## 2019-10-03 MED ORDER — HEPARIN SODIUM (PORCINE) 5000 UNIT/ML IJ SOLN
5000.0000 [IU] | Freq: Three times a day (TID) | INTRAMUSCULAR | Status: DC
  Filled 2019-10-03: qty 1

## 2019-10-03 MED ORDER — ONDANSETRON HCL 4 MG/2ML IJ SOLN
4.0000 mg | Freq: Four times a day (QID) | INTRAMUSCULAR | Status: DC | PRN
  Administered 2019-10-07 – 2019-10-09 (×3): 4 mg via INTRAVENOUS
  Filled 2019-10-03 (×3): qty 2

## 2019-10-03 MED ORDER — SORBITOL 70 % SOLN
30.0000 mL | Status: DC | PRN
  Filled 2019-10-03: qty 30

## 2019-10-03 MED ORDER — ZOLPIDEM TARTRATE 5 MG PO TABS: 5.0000 mg | ORAL_TABLET | Freq: Every evening | ORAL | Status: DC | PRN

## 2019-10-03 MED ORDER — SODIUM CHLORIDE 0.9 % IV SOLN
INTRAVENOUS | Status: DC
  Administered 2019-10-04: 01:00:00 via INTRAVENOUS

## 2019-10-03 MED ORDER — CAMPHOR-MENTHOL 0.5-0.5 % EX LOTN
1.0000 "application " | TOPICAL_LOTION | Freq: Three times a day (TID) | CUTANEOUS | Status: DC | PRN
  Filled 2019-10-03: qty 222

## 2019-10-03 MED ORDER — DOCUSATE SODIUM 283 MG RE ENEM
1.0000 | ENEMA | RECTAL | Status: DC | PRN
  Filled 2019-10-03: qty 1

## 2019-10-03 MED ORDER — HYDROXYZINE HCL 25 MG PO TABS
25.0000 mg | ORAL_TABLET | Freq: Three times a day (TID) | ORAL | Status: DC | PRN
  Filled 2019-10-03: qty 1

## 2019-10-03 MED ORDER — SODIUM CHLORIDE 0.9 % IV SOLN
1.0000 g | INTRAVENOUS | Status: DC
  Administered 2019-10-04 – 2019-10-05 (×2): 1 g via INTRAVENOUS
  Filled 2019-10-03 (×3): qty 10

## 2019-10-03 NOTE — ED Notes (Signed)
One culture collected, pt difficult stick. Antibiotics given and will attempt to collect second set

## 2019-10-03 NOTE — ED Notes (Signed)
Remains alert and talkative without any changes noted.  Remains slightly confused with admissions in the room.

## 2019-10-03 NOTE — ED Notes (Signed)
Staff attempted to ambulate the pt to the rest room, the pt had a very unsteady gate and needed a wheelchair.

## 2019-10-03 NOTE — ED Notes (Signed)
[name] [name], pt daughter would like update when available. [phone]

## 2019-10-03 NOTE — ED Notes (Signed)
Pt's continuous hypotension noted. Gave pt ordered LR bolus. Will continue to monitor

## 2019-10-03 NOTE — ED Notes (Signed)
Trop 651.  Dr. Alvino Chapel notified.

## 2019-10-03 NOTE — ED Notes (Signed)
Pt's daughter at bedside due to pt's confusion

## 2019-10-03 NOTE — ED Notes (Signed)
Son states she has been manic the last few days, [place] had a visit today and noted that she had low BP and sent her to the ED.  Also reported that after every dialysis she has increased confusion episodes and hypotension.

## 2019-10-03 NOTE — ED Notes (Signed)
Will run bolus #2 and then ambulate for UA.  Pt remains unchanged at this time.

## 2019-10-03 NOTE — ED Triage Notes (Signed)
Pt confused as to events.  States her daughter and doctor were involved and EMS was called.  Pt was up all night without specific sx identified.  Hx of fall approx 3 weeks ago with possible LOC/head injury.  Hx of dialysis with M/W/F schedule.  Pt unsure if her BP was low yesterday.  She does report currently that she has Low blood, and heart fluttering, bilateral leg pain and R ankle pain.  NAD or dyspnea noted.  Pt is hypotensive with HR of 86

## 2019-10-03 NOTE — ED Provider Notes (Addendum)
[facility] EMERGENCY DEPARTMENT Provider Note   CSN: [id number] Arrival date & time: 10/03/19  1530     History   Chief Complaint No chief complaint on file.   HPI [name] [name] is a 78 y.o. female.     HPI Patient brought in for confusion.  Per son patient states she has been manic for the last few days.  Had low blood pressure and her home health nurse sent her in today.  Reportedly is a dialysis patient and sometimes has dialysis related hypotension and confusion.  Although complaining of pain in left arm and left leg also.  She states she had a fall around 3 weeks ago and hit her head.  Has not had imaging since then and per son has been somewhat confused since then also.  No fevers or chills.  Somewhat decreased oral intake.  No chest pain.  Has felt her heart fluttering.  Patient is Monday Wednesday Friday dialysis through her left arm.  Was dialyzed yesterday, Wednesday, and had a full run done. Past Medical History:  Diagnosis Date  . DEPRESSION   . DIABETES MELLITUS, TYPE I   . ESRD (end stage renal disease) on dialysis ([place])    MWF HD  . GERD   . GLAUCOMA   . Headache(784.0)   . Hepatitis B carrier ([place])    05/2009: neg Hep C; Hep B: core pos, Surf neg; fatty liver US 8/10 - 7/13  . HYPERLIPIDEMIA   . HYPERTENSION   . Memory deficit   . OSTEOPENIA   . POSTMENOPAUSAL STATUS   . Pulmonary edema   . Unspecified vitamin D deficiency     Patient Active Problem List   Diagnosis Date Noted  . AMS (altered mental status) 10/03/2019  . Sepsis ([place]) 10/03/2019  . Acute lower UTI 10/03/2019  . Chronic pain of left knee 01/17/2019  . ESRD (end stage renal disease) ([place]) 01/08/2019  . AV graft malfunction, initial encounter ([place]) 12/14/2018  . Acute respiratory failure with hypoxia ([place]) 12/13/2018  . AV graft malfunction ([facility]) 09/03/2018  . Urinary tract infection without hematuria 08/21/2018  . Memory loss 08/21/2018  . Anemia, chronic disease  03/01/2018  . Vasovagal syncope 03/01/2018  . Macrocytic anemia 01/28/2018  . Orthostatic syncope 01/28/2018  . EKG, abnormal   . Acute metabolic encephalopathy 123456  . Influenza 11/16/2017  . Fever in adult 11/15/2017  . Demand ischemia ([place]) 11/10/2017  . Normocytic anemia 11/10/2017  . Aortic atherosclerosis ([place]) 11/10/2017  . Respiratory failure with hypoxia ([place]) 08/18/2017  . Volume overload 08/18/2017  . Dialysis patient, noncompliant ([place]) 08/17/2017  . Thumb pain, left 12/22/2016  . Chronic diastolic heart failure ([place]) 11/25/2016  . ESRD (end stage renal disease) on dialysis ([place]) 06/25/2014  . Malignant hypertension 05/17/2014  . Chest pain 05/17/2014  . Elevated troponin 05/17/2014  . Hypertensive emergency 05/17/2014  . NSTEMI (non-ST elevated myocardial infarction) ([place]) 05/17/2014  . GERD 06/17/2009  . Hepatitis B carrier ([place]) 06/17/2009  . DEPRESSION 05/20/2009  . Unspecified glaucoma 05/20/2009  . HEADACHE 05/20/2009  . CARDIAC MURMUR 05/20/2009  . OSTEOPENIA 02/15/2008  . POSTMENOPAUSAL STATUS 12/21/2007  . Hyperlipidemia LDL goal <70 09/21/2007  . Essential hypertension 09/13/2007  . Type II diabetes mellitus with nephropathy [facility])     Past Surgical History:  Procedure Laterality Date  . A/V SHUNTOGRAM N/A 09/06/2018   Procedure: A/V SHUNTOGRAM - Left AV;  Surgeon: Marty Heck, MD;  Location: [facility] CV LAB;  Service: Cardiovascular;  Laterality: N/A;  . A/V SHUNTOGRAM Left 06/13/2019   Procedure: A/V SHUNTOGRAM;  Surgeon: Marty Heck, MD;  Location: [facility] CV LAB;  Service: Cardiovascular;  Laterality: Left;  . AV FISTULA PLACEMENT Left 05/29/2014   Procedure: INSERTION OF ARTERIOVENOUS (AV) GORE-TEX GRAFT ARM-LEFT;  Surgeon: Angelia Mould, MD;  Location: [facility];  Service: Vascular;  Laterality: Left;  . CHOLECYSTECTOMY  02/12/07  . INSERTION OF DIALYSIS CATHETER Right 05/27/2014   Procedure: INSERTION OF DIALYSIS  CATHETER;  Surgeon: Angelia Mould, MD;  Location: [facility];  Service: Vascular;  Laterality: Right;  . IR DIALY SHUNT INTRO [place] W/IMG LEFT Left 12/14/2018  . PERIPHERAL VASCULAR BALLOON ANGIOPLASTY Left 09/06/2018   Procedure: PERIPHERAL VASCULAR BALLOON ANGIOPLASTY;  Surgeon: Marty Heck, MD;  Location: [place] CV LAB;  Service: Cardiovascular;  Laterality: Left;  arm shunt  . PERIPHERAL VASCULAR BALLOON ANGIOPLASTY  06/13/2019   Procedure: PERIPHERAL VASCULAR BALLOON ANGIOPLASTY;  Surgeon: Marty Heck, MD;  Location: [facility] CV LAB;  Service: Cardiovascular;;  . REFRACTIVE SURGERY  07/29/09   Dr. Bing Plume  . TONSILLECTOMY       OB History   No obstetric history on file.      Home Medications    Prior to Admission medications   Medication Sig Start Date End Date Taking? Authorizing Provider  acetaminophen (TYLENOL) 325 MG tablet Take 650 mg by mouth every 6 (six) hours as needed for mild pain.   Yes [provider]  carvedilol (COREG) 25 MG tablet Take 1 tablet (25 mg total) by mouth 2 (two) times daily with a meal. 07/04/19  Yes Ann Held, DO  cinacalcet (SENSIPAR) 30 MG tablet Take 30 mg by mouth daily.  06/05/19  Yes [provider]  folic acid-vitamin b complex-vitamin c-selenium-zinc (DIALYVITE) 3 MG TABS tablet Take 1 tablet by mouth daily.   Yes [provider]  furosemide (LASIX) 40 MG tablet TAKE 1 TABLET BY MOUTH EVERY DAY Patient taking differently: Take 40 mg by mouth daily.  06/04/19  Yes Roma Schanz R, DO  isosorbide mononitrate (IMDUR) 30 MG 24 hr tablet TAKE 1 TABLET (30 MG TOTAL) BY MOUTH 2 (TWO) TIMES DAILY. 09/30/19  Yes Roma Schanz R, DO  nitroGLYCERIN (NITROSTAT) 0.4 MG SL tablet Place 1 tablet (0.4 mg total) under the tongue every 5 (five) minutes as needed for chest pain. 0000000  Yes Delora Fuel, MD  sevelamer carbonate (RENVELA) 800 MG tablet Take 2 tablets (1,600 mg  total) by mouth 3 (three) times daily with meals. Patient not taking: Reported on 06/10/2019 10/18/18   Kayleen Memos, DO    Family History Family History  Problem Relation Age of Onset  . Kidney disease Mother   . Coronary artery disease Other   . Diabetes Other   . Hypertension Other   . Arthritis Other   . Kidney disease Sister     Social History Social History   Tobacco Use  . Smoking status: Never Smoker  . Smokeless tobacco: Never Used  . Tobacco comment: Married x's 27yrs, 6 kids-scattered [employer]. Retired Consulting civil engineer  Substance Use Topics  . Alcohol use: No  . Drug use: No     Allergies   Hydralazine and Robaxin [methocarbamol]   Review of Systems Review of Systems  Constitutional: Positive for appetite change.  HENT: Negative for congestion.   Respiratory: Positive for shortness of breath.   Cardiovascular: Negative for chest pain.  Musculoskeletal: Negative for back pain.  Skin: Negative for rash.  Neurological: Negative for numbness.  Psychiatric/Behavioral: Positive for confusion.     Physical Exam Updated Vital Signs BP (!) 86/55   Pulse 82   Temp 97.8 F (36.6 C) (Oral)   Resp (!) 26   Ht 5\' 6"  (1.676 m)   Wt 59 kg   SpO2 94%   BMI 20.98 kg/m   Physical Exam Vitals signs and nursing note reviewed.  HENT:     Head: Normocephalic.  Eyes:     Extraocular Movements: Extraocular movements intact.  Cardiovascular:     Rate and Rhythm: Regular rhythm.  Pulmonary:     Breath sounds: No wheezing, rhonchi or rales.  Abdominal:     Tenderness: There is no abdominal tenderness.  Musculoskeletal:     Right lower leg: Edema present.     Left lower leg: Edema present.     Comments: Pitting edema bilateral lower extremities.  Skin:    General: Skin is warm.  Neurological:     Mental Status: She is alert.     Comments: Awake and pleasant but may have some mild confusion.      ED Treatments / Results  Labs (all labs ordered are  listed, but only abnormal results are displayed) Labs Reviewed  COMPREHENSIVE METABOLIC PANEL - Abnormal; Notable for the following components:      Result Value   Glucose, Bld 113 (*)    Creatinine, Ser 3.51 (*)    Calcium 7.8 (*)    Albumin 2.6 (*)    AST 55 (*)    Alkaline Phosphatase 141 (*)    GFR calc non Af Amer 12 (*)    GFR calc Af Amer 14 (*)    All other components within normal limits  CBC WITH DIFFERENTIAL/PLATELET - Abnormal; Notable for the following components:   WBC 2.6 (*)    RBC 2.76 (*)    Hemoglobin 9.1 (*)    HCT 29.0 (*)    MCV 105.1 (*)    RDW 16.7 (*)    Platelets 62 (*)    Lymphs Abs 0.4 (*)    All other components within normal limits  URINALYSIS, ROUTINE W REFLEX MICROSCOPIC - Abnormal; Notable for the following components:   APPearance CLOUDY (*)    Hgb urine dipstick MODERATE (*)    Bilirubin Urine MODERATE (*)    Ketones, ur 15 (*)    Protein, ur >300 (*)    Leukocytes,Ua MODERATE (*)    All other components within normal limits  URINALYSIS, MICROSCOPIC (REFLEX) - Abnormal; Notable for the following components:   Bacteria, UA MANY (*)    All other components within normal limits  PROTIME-INR - Abnormal; Notable for the following components:   Prothrombin Time 15.7 (*)    INR 1.3 (*)    All other components within normal limits  TROPONIN I (HIGH SENSITIVITY) - Abnormal; Notable for the following components:   Troponin I (High Sensitivity) 651 (*)    All other components within normal limits  TROPONIN I (HIGH SENSITIVITY) - Abnormal; Notable for the following components:   Troponin I (High Sensitivity) 675 (*)    All other components within normal limits  CULTURE, BLOOD (ROUTINE X 2)  CULTURE, BLOOD (ROUTINE X 2)  URINE CULTURE  SARS CORONAVIRUS 2 (TAT 6-24 HRS)  LACTIC ACID, PLASMA  APTT  LACTIC ACID, PLASMA  CBC  CREATININE, SERUM  CBC  RENAL FUNCTION PANEL    EKG EKG  Interpretation  Date/Time:  Thursday October 03 2019  15:34:18 EST Ventricular Rate:  76 PR Interval:    QRS Duration: 86 QT Interval:  407 QTC Calculation: 458 R Axis:   77 Text Interpretation: Sinus rhythm Supraventricular bigeminy Probable LVH with secondary repol abnrm ST depression, consider ischemia, diffuse lds Confirmed by Davonna Belling [phone]) on 10/03/2019 3:43:17 PM   Radiology Ct Head Wo Contrast  Result Date: 10/03/2019 CLINICAL DATA:  Altered level of consciousness (LOC), unexplained EXAM: CT HEAD WITHOUT CONTRAST TECHNIQUE: Contiguous axial images were obtained from the base of the skull through the vertex without intravenous contrast. COMPARISON:  Head CT 07-11-202019 FINDINGS: Brain: No evidence of acute infarction, hemorrhage, extra-axial collection or mass lesion/mass effect. Stable degree of atrophy. Ventricular prominence is unchanged, likely due to central atrophy. There is mild chronic small vessel ischemia. Scattered streak artifact from hair clips. Vascular: Atherosclerosis of skullbase vasculature without hyperdense vessel or abnormal calcification. Skull: No fracture or focal lesion. Sinuses/Orbits: Paranasal sinuses and mastoid air cells are clear. The visualized orbits are unremarkable. Bilateral cataract resection. Other: None. IMPRESSION: 1. No acute intracranial abnormality. 2. Stable atrophy and chronic small vessel ischemia since 2019. Electronically Signed   By: Keith Rake M.D.   On: 10/03/2019 18:42   Dg Chest Portable 1 View  Result Date: 10/03/2019 CLINICAL DATA:  Altered mental status EXAM: PORTABLE CHEST 1 VIEW COMPARISON:  December 13, 2018 FINDINGS: Small to moderate right pleural effusion and adjacent atelectasis/consolidation. Stable cardiomediastinal contours. No pneumothorax. IMPRESSION: Small to moderate right pleural effusion with adjacent atelectasis/consolidation. Electronically Signed   By: Macy Mis M.D.   On: 10/03/2019 16:34    Procedures Procedures (including critical care  time)  Medications Ordered in ED Medications  acetaminophen (TYLENOL) tablet 650 mg (has no administration in time range)    Or  acetaminophen (TYLENOL) suppository 650 mg (has no administration in time range)  zolpidem (AMBIEN) tablet 5 mg (has no administration in time range)  camphor-menthol (SARNA) lotion 1 application (has no administration in time range)    And  hydrOXYzine (ATARAX/VISTARIL) tablet 25 mg (has no administration in time range)  sorbitol 70 % solution 30 mL (has no administration in time range)  docusate sodium (ENEMEEZ) enema 283 mg (has no administration in time range)  ondansetron (ZOFRAN) tablet 4 mg (has no administration in time range)    Or  ondansetron (ZOFRAN) injection 4 mg (has no administration in time range)  calcium carbonate (dosed in mg elemental calcium) suspension 500 mg of elemental calcium (has no administration in time range)  feeding supplement (NEPRO CARB STEADY) liquid 237 mL (has no administration in time range)  heparin injection 5,000 Units (has no administration in time range)  0.9 %  sodium chloride infusion (has no administration in time range)  cefTRIAXone (ROCEPHIN) 1 g in sodium chloride 0.9 % 100 mL IVPB (has no administration in time range)  sodium chloride 0.9 % bolus 500 mL (0 mLs Intravenous Stopped 10/03/19 1640)  sodium chloride 0.9 % bolus 500 mL (0 mLs Intravenous Stopped 10/03/19 1930)  lactated ringers bolus 1,000 mL (1,000 mLs Intravenous New Bag/Given 10/03/19 2100)  ceFEPIme (MAXIPIME) 2 g in sodium chloride 0.9 % 100 mL IVPB (0 g Intravenous Stopped 10/03/19 2200)     Initial Impression / Assessment and Plan / ED Course  I have reviewed the triage vital signs and the nursing notes.  Pertinent labs & imaging results that were available during my care of the patient were reviewed  by me and considered in my medical decision making (see chart for details).        Patient brought in for mental status changes confusion.   Not febrile.  Hypotensive the patient often gets hypotensive after dialysis.  Initially no clear infection so not clearly sepsis start with despite the hypotension.  Fluid judiciously given due to history of end-stage renal disease and the fact she was mentating well.  Troponin of 600.  Not having chest pain.  Repeat same.  However once urinalysis eventually came back showed infection.  At that point code sepsis called.  More fluid given.  Lactate and blood cultures added.  Patient had nonspecific ST changes that she will actually sort of go in and out of.  Pharmacy will be dosing antibiotics for urinary tract infection.  There is delay in the diagnosis of sepsis since initially had no clear infection and not febrile.    Lactic acid is returned as normal.  Blood pressure still somewhat soft.  However with the normal lactic acid is more reassuring that the hypotension may not be due to a severe sepsis.  There are multiple abnormalities on the labs but most of the severe sepsis qualifying lab abnormalities she has at baseline and or not but different from her baseline.  Antibiotics given.  Fluid boluses given.  Will admit to internal medicine.  CRITICAL CARE Performed by: Davonna Belling Total critical care time: 30 minutes Critical care time was exclusive of separately billable procedures and treating other patients. Critical care was necessary to treat or prevent imminent or life-threatening deterioration. Critical care was time spent personally by me on the following activities: development of treatment plan with patient and/or surrogate as well as nursing, discussions with consultants, evaluation of patient's response to treatment, examination of patient, obtaining history from patient or surrogate, ordering and performing treatments and interventions, ordering and review of laboratory studies, ordering and review of radiographic studies, pulse oximetry and re-evaluation of patient's  condition.   Final Clinical Impressions(s) / ED Diagnoses   Final diagnoses:  Urinary tract infection without hematuria, site unspecified  End stage renal disease on dialysis [facility])  Sepsis with acute organ dysfunction, due to unspecified organism, unspecified type, unspecified whether septic shock present [facility])    ED Discharge Orders    None       Davonna Belling, MD 10/03/19 [id number]    Davonna Belling, MD 10/21/19 [phone]

## 2019-10-03 NOTE — H&P (Signed)
History and Physical   [name] [name] [id number] DOB: 1941/07/17 DOA: 10/03/2019  Referring MD/NP/PA: Dr. Alvino Chapel  PCP: Carollee Herter, Alferd Apa, DO   Outpatient Specialists: [facility]  Patient coming from: Home  Chief Complaint: Confusion and weakness  HPI: [name] [name] is a 78 y.o. female with medical history significant of end-stage renal disease on hemodialysis Mondays Wednesdays and Fridays, diabetes, depression, GERD, noncompliance, hypertension who apparently went to her hemodialysis yesterday.  In the last week or 2 she has been having weakness.  She has had some confusion with hypotension occasionally after dialysis but in the last 3 weeks since she had a fall she has been having more confusion and more weakness.  Her oral intake has also decreased.  Family brought her in because she has not been herself.  She has pain in the left and left leg and in general nonspecific complaint.  They also noticed some diaphoresis as well as patient reporting some chills.  She has alternated between being depressed to being very manic and thus not her usual self so family were worried.  Daughter is at bedside giving history.  Patient was found to have evidence of UTI and a lot of electrolyte abnormalities.  She reported that her nephrologist has noted the electrolyte abnormalities previously and recommended that she comes to the hospital for admission but she declined.  Today however she was brought in by family who insisted that she comes in and gets checked.  ED Course: Temperature is 9078 blood pressure 77/49 initially.  Pulse 82 respiratory 26 oxygen sat 94% room air.  White count is 2.6 hemoglobin 9.1 and platelet count of 62.  Sodium 139 potassium 4.4 chloride 98 CO2 32 glucose 113 BUN 12 creatinine 3.51 calcium 7.8 albumin 2.6.  Troponin is 651 and then 675 lactic acid 1.8.  PT 15.7 INR 1.2 urinalysis showed cloudy urine.  Leukocytes.  Many bacteria WBC 11-20.  Chest x-ray  showed no acute findings.  Urine cultures obtained and patient has been admitted for treatment  Review of Systems: As per HPI otherwise 10 point review of systems negative.    Past Medical History:  Diagnosis Date  . DEPRESSION   . DIABETES MELLITUS, TYPE I   . ESRD (end stage renal disease) on dialysis ([place])    MWF HD  . GERD   . GLAUCOMA   . Headache(784.0)   . Hepatitis B carrier ([place])    05/2009: neg Hep C; Hep B: core pos, Surf neg; fatty liver US 8/10 - 7/13  . HYPERLIPIDEMIA   . HYPERTENSION   . Memory deficit   . OSTEOPENIA   . POSTMENOPAUSAL STATUS   . Pulmonary edema   . Unspecified vitamin D deficiency     Past Surgical History:  Procedure Laterality Date  . A/V SHUNTOGRAM N/A 09/06/2018   Procedure: A/V SHUNTOGRAM - Left AV;  Surgeon: Marty Heck, MD;  Location: [facility];  Service: Cardiovascular;  Laterality: N/A;  . A/V SHUNTOGRAM Left 06/13/2019   Procedure: A/V SHUNTOGRAM;  Surgeon: Marty Heck, MD;  Location: [facility];  Service: Cardiovascular;  Laterality: Left;  . AV FISTULA PLACEMENT Left 05/29/2014   Procedure: INSERTION OF ARTERIOVENOUS (AV) GORE-TEX GRAFT ARM-LEFT;  Surgeon: Angelia Mould, MD;  Location: [facility];  Service: Vascular;  Laterality: Left;  . CHOLECYSTECTOMY  02/12/07  . INSERTION OF DIALYSIS CATHETER Right 05/27/2014   Procedure: INSERTION OF DIALYSIS CATHETER;  Surgeon: Angelia Mould, MD;  Location: [facility];  Service: Vascular;  Laterality: Right;  . IR DIALY SHUNT INTRO NEEDLE/INTRACATH INITIAL W/IMG LEFT Left 12/14/2018  . PERIPHERAL VASCULAR BALLOON ANGIOPLASTY Left 09/06/2018   Procedure: PERIPHERAL VASCULAR BALLOON ANGIOPLASTY;  Surgeon: Marty Heck, MD;  Location: [facility];  Service: Cardiovascular;  Laterality: Left;  arm shunt  . PERIPHERAL VASCULAR BALLOON ANGIOPLASTY  06/13/2019   Procedure: PERIPHERAL VASCULAR BALLOON ANGIOPLASTY;  Surgeon: Marty Heck, MD;   Location: [facility];  Service: Cardiovascular;;  . REFRACTIVE SURGERY  07/29/09   Dr. Bing Plume  . TONSILLECTOMY       reports that she has never smoked. She has never used smokeless tobacco. She reports that she does not drink alcohol or use drugs.  Allergies  Allergen Reactions  . Hydralazine Itching  . Robaxin [Methocarbamol] Other (See Comments)    Dizziness     Family History  Problem Relation Age of Onset  . Kidney disease Mother   . Coronary artery disease Other   . Diabetes Other   . Hypertension Other   . Arthritis Other   . Kidney disease Sister      Prior to Admission medications   Medication Sig Start Date End Date Taking? Authorizing Provider  aspirin EC 81 MG tablet Take 81 mg by mouth daily.    [provider]  carvedilol (COREG) 25 MG tablet Take 1 tablet (25 mg total) by mouth 2 (two) times daily with a meal. 07/04/19   Carollee Herter, Alferd Apa, DO  cinacalcet (SENSIPAR) 30 MG tablet Take 30 mg by mouth daily.  06/05/19   [provider]  folic acid-vitamin b complex-vitamin c-selenium-zinc (DIALYVITE) 3 MG TABS tablet Take 1 tablet by mouth daily.    [provider]  furosemide (LASIX) 40 MG tablet TAKE 1 TABLET BY MOUTH EVERY DAY Patient taking differently: Take 40 mg by mouth daily.  06/04/19   Ann Held, DO  isosorbide mononitrate (IMDUR) 30 MG 24 hr tablet TAKE 1 TABLET (30 MG TOTAL) BY MOUTH 2 (TWO) TIMES DAILY. 09/30/19   Ann Held, DO  nitroGLYCERIN (NITROSTAT) 0.4 MG SL tablet Place 1 tablet (0.4 mg total) under the tongue every 5 (five) minutes as needed for chest pain. Patient not taking: Reported on [id number]   Delora Fuel, MD  sevelamer carbonate (RENVELA) 0.8 g PACK packet Take 0.8 g by mouth 3 (three) times daily with meals.  05/16/19   [provider]  sevelamer carbonate (RENVELA) 800 MG tablet Take 2 tablets (1,600 mg total) by mouth 3 (three) times daily with meals. Patient not  taking: Reported on 06/10/2019 10/18/18   Kayleen Memos, DO    Physical Exam: Vitals:   10/03/19 2154 10/03/19 2200 10/03/19 2215 10/03/19 2216  BP:  (!) 88/62 (!) 79/57   Pulse: 74 (!) 41  63  Resp: (!) 26     Temp:      TempSrc:      SpO2: 100% 97%  100%  Weight:      Height:          Constitutional: Excited, slightly confused Vitals:   10/03/19 2154 10/03/19 2200 10/03/19 2215 10/03/19 2216  BP:  (!) 88/62 (!) 79/57   Pulse: 74 (!) 41  63  Resp: (!) 26     Temp:      TempSrc:      SpO2: 100% 97%  100%  Weight:      Height:  Eyes: PERRL, lids and conjunctivae normal ENMT: Mucous membranes are dry posterior pharynx clear of any exudate or lesions.Normal dentition.  Neck: normal, supple, no masses, no thyromegaly Respiratory: clear to auscultation bilaterally, no wheezing, no crackles. Normal respiratory effort. No accessory muscle use.  Cardiovascular: Regular rate and rhythm, no murmurs / rubs / gallops. No extremity edema. 2+ pedal pulses. No carotid bruits.  Abdomen: no tenderness, no masses palpated. No hepatosplenomegaly. Bowel sounds positive.  Musculoskeletal: no clubbing / cyanosis. No joint deformity upper and lower extremities. Good ROM, no contractures. Normal muscle tone.  Skin: no rashes, lesions, ulcers. No induration Neurologic: CN 2-12 grossly intact. Sensation intact, DTR normal. Strength 5/5 in all 4.  Psychiatric: Withdrawn but occasionally excited appears confused    Labs on Admission: I have personally reviewed following labs and imaging studies  CBC: Recent Labs  Lab 10/03/19 1600  WBC 2.6*  NEUTROABS 1.9  HGB 9.1*  HCT 29.0*  MCV 105.1*  PLT 62*   Basic Metabolic Panel: Recent Labs  Lab 10/03/19 1600  NA 139  K 4.4  CL 98  CO2 32  GLUCOSE 113*  BUN 12  CREATININE 3.51*  CALCIUM 7.8*   GFR: Estimated Creatinine Clearance: 12.5 mL/min (A) (by C-G formula based on SCr of 3.51 mg/dL (H)). Liver Function Tests: Recent  Labs  Lab 10/03/19 1600  AST 55*  ALT 26  ALKPHOS 141*  BILITOT 0.9  PROT 6.9  ALBUMIN 2.6*   No results for input(s): LIPASE, AMYLASE in the last 168 hours. No results for input(s): AMMONIA in the last 168 hours. Coagulation Profile: Recent Labs  Lab 10/03/19 2054  INR 1.3*   Cardiac Enzymes: No results for input(s): CKTOTAL, CKMB, CKMBINDEX, TROPONINI in the last 168 hours. BNP (last 3 results) No results for input(s): PROBNP in the last 8760 hours. HbA1C: No results for input(s): HGBA1C in the last 72 hours. CBG: No results for input(s): GLUCAP in the last 168 hours. Lipid Profile: No results for input(s): CHOL, HDL, LDLCALC, TRIG, CHOLHDL, LDLDIRECT in the last 72 hours. Thyroid Function Tests: No results for input(s): TSH, T4TOTAL, FREET4, T3FREE, THYROIDAB in the last 72 hours. Anemia Panel: No results for input(s): VITAMINB12, FOLATE, FERRITIN, TIBC, IRON, RETICCTPCT in the last 72 hours. Urine analysis:    Component Value Date/Time   COLORURINE YELLOW 10/03/2019 2014   APPEARANCEUR CLOUDY (A) 10/03/2019 2014   LABSPEC 1.025 10/03/2019 2014   PHURINE 5.5 10/03/2019 2014   GLUCOSEU NEGATIVE 10/03/2019 2014   HGBUR MODERATE (A) 10/03/2019 2014   BILIRUBINUR MODERATE (A) 10/03/2019 2014   BILIRUBINUR neg 08/21/2018 1145   KETONESUR 15 (A) 10/03/2019 2014   PROTEINUR >300 (A) 10/03/2019 2014   UROBILINOGEN 0.2 08/21/2018 1145   UROBILINOGEN 2.0 (H) 04/01/2008 1751   NITRITE NEGATIVE 10/03/2019 2014   LEUKOCYTESUR MODERATE (A) 10/03/2019 2014   Sepsis Labs: @LABRCNTIP (procalcitonin:4,lacticidven:4) )No results found for this or any previous visit (from the past 240 hour(s)).   Radiological Exams on Admission: Ct Head Wo Contrast  Result Date: 10/03/2019 CLINICAL DATA:  Altered level of consciousness (LOC), unexplained EXAM: CT HEAD WITHOUT CONTRAST TECHNIQUE: Contiguous axial images were obtained from the base of the skull through the vertex without  intravenous contrast. COMPARISON:  Head CT March 14, 202019 FINDINGS: Brain: No evidence of acute infarction, hemorrhage, extra-axial collection or mass lesion/mass effect. Stable degree of atrophy. Ventricular prominence is unchanged, likely due to central atrophy. There is mild chronic small vessel ischemia. Scattered streak artifact from hair clips. Vascular: Atherosclerosis  of skullbase vasculature without hyperdense vessel or abnormal calcification. Skull: No fracture or focal lesion. Sinuses/Orbits: Paranasal sinuses and mastoid air cells are clear. The visualized orbits are unremarkable. Bilateral cataract resection. Other: None. IMPRESSION: 1. No acute intracranial abnormality. 2. Stable atrophy and chronic small vessel ischemia since 2019. Electronically Signed   By: Keith Rake M.D.   On: 10/03/2019 18:42   Dg Chest Portable 1 View  Result Date: 10/03/2019 CLINICAL DATA:  Altered mental status EXAM: PORTABLE CHEST 1 VIEW COMPARISON:  December 13, 2018 FINDINGS: Small to moderate right pleural effusion and adjacent atelectasis/consolidation. Stable cardiomediastinal contours. No pneumothorax. IMPRESSION: Small to moderate right pleural effusion with adjacent atelectasis/consolidation. Electronically Signed   By: Macy Mis M.D.   On: 10/03/2019 16:34      Assessment/Plan Principal Problem:   AMS (altered mental status) Active Problems:   Type II diabetes mellitus with nephropathy (HCC)   Hyperlipidemia LDL goal <70   Essential hypertension   GERD   ESRD (end stage renal disease) (HCC)   Sepsis ([place])   Acute lower UTI     #1 altered mental status: Appears to be metabolic encephalopathy primarily due to UTI but also possibly other medical problems.  Patient will be admitted to progressive care.  She appears to have sepsis type syndrome.  We will treat accordingly and monitor mental status.  She is already awake but confused.  #2 sepsis: Secondary to UTI.  She appears to have severe  sepsis with hypotension but lactic acid is only 1.8.  Will initiate fluids and IV antibiotics monitor.  #3 UTI: Possibly because of patient's worsening symptoms.  IV Rocephin.  Urine and blood cultures obtained.  #4 end-stage renal disease: Nephrology consult in the morning.  Hemodialysis due tomorrow.  #5 diabetes: Initiate sliding scale insulin with home regimen.  #6 essential hypertension: Blood pressure is now low.  Hold home medications.  #7 GERD: Continue PPIs  #8 hyperlipidemia: Continue with statin   DVT prophylaxis: Heparin Code Status: Full code Family Communication: Daughter at bedside Disposition Plan: Home Consults called: Nephrology consult in the morning Admission status: Inpatient  Severity of Illness: The appropriate patient status for this patient is INPATIENT. Inpatient status is judged to be reasonable and necessary in order to provide the required intensity of service to ensure the patient's safety. The patient's presenting symptoms, physical exam findings, and initial radiographic and laboratory data in the context of their chronic comorbidities is felt to place them at high risk for further clinical deterioration. Furthermore, it is not anticipated that the patient will be medically stable for discharge from the hospital within 2 midnights of admission. The following factors support the patient status of inpatient.   " The patient's presenting symptoms include Weakness and confusion. " The worrisome physical exam findings include excited. " The initial radiographic and laboratory data are worrisome because of urinalysis consistent with UTI. " The chronic co-morbidities include end-stage renal disease.   * I certify that at the point of admission it is my clinical judgment that the patient will require inpatient hospital care spanning beyond 2 midnights from the point of admission due to high intensity of service, high risk for further deterioration and high  frequency of surveillance required.Barbette Merino MD [facility] Pager [phone]  If 7PM-7AM, please contact night-coverage [email or web address] Password [facility]  10/03/2019, 10:23 PM

## 2019-10-03 NOTE — ED Notes (Signed)
Pt did not have enough urine to provide culture from previous UA. Will attempt to collect next time pt urinates

## 2019-10-04 DIAGNOSIS — G9341 Metabolic encephalopathy: Secondary | ICD-10-CM

## 2019-10-04 DIAGNOSIS — E1121 Type 2 diabetes mellitus with diabetic nephropathy: Secondary | ICD-10-CM

## 2019-10-04 DIAGNOSIS — D61818 Other pancytopenia: Secondary | ICD-10-CM

## 2019-10-04 DIAGNOSIS — I10 Essential (primary) hypertension: Secondary | ICD-10-CM

## 2019-10-04 LAB — GLUCOSE, CAPILLARY
Glucose-Capillary: 141 mg/dL — ABNORMAL HIGH (ref 70–99)
Glucose-Capillary: 30 mg/dL — CL (ref 70–99)

## 2019-10-04 LAB — RENAL FUNCTION PANEL
Albumin: 2.3 g/dL — ABNORMAL LOW (ref 3.5–5.0)
Albumin: 2.8 g/dL — ABNORMAL LOW (ref 3.5–5.0)
Anion gap: 12 (ref 5–15)
Anion gap: 19 — ABNORMAL HIGH (ref 5–15)
BUN: 14 mg/dL (ref 8–23)
BUN: 21 mg/dL (ref 8–23)
CO2: 27 mmol/L (ref 22–32)
CO2: 21 mmol/L — ABNORMAL LOW (ref 22–32)
Calcium: 7.6 mg/dL — ABNORMAL LOW (ref 8.9–10.3)
Calcium: 8.4 mg/dL — ABNORMAL LOW (ref 8.9–10.3)
Chloride: 102 mmol/L (ref 98–111)
Chloride: 102 mmol/L (ref 98–111)
Creatinine, Ser: 3.79 mg/dL — ABNORMAL HIGH (ref 0.44–1.00)
Creatinine, Ser: 4.17 mg/dL — ABNORMAL HIGH (ref 0.44–1.00)
GFR calc Af Amer: 11 mL/min — ABNORMAL LOW (ref >60)
GFR calc non Af Amer: 10 mL/min — ABNORMAL LOW (ref >60)
Glucose, Bld: 128 mg/dL — ABNORMAL HIGH (ref 70–99)
Glucose, Bld: 48 mg/dL — ABNORMAL LOW (ref 70–99)
Phosphorus: 3.6 mg/dL (ref 2.5–4.6)
Phosphorus: 4.9 mg/dL — ABNORMAL HIGH (ref 2.5–4.6)
Potassium: 3.7 mmol/L (ref 3.5–5.1)
Potassium: 4.4 mmol/L (ref 3.5–5.1)
Sodium: 141 mmol/L (ref 135–145)
Sodium: 142 mmol/L (ref 135–145)

## 2019-10-04 LAB — CBC
HCT: 25.2 % — ABNORMAL LOW (ref 36.0–46.0)
HCT: 26.8 % — ABNORMAL LOW (ref 36.0–46.0)
HCT: 33.1 % — ABNORMAL LOW (ref 36.0–46.0)
Hemoglobin: 7.9 g/dL — ABNORMAL LOW (ref 12.0–15.0)
Hemoglobin: 8.5 g/dL — ABNORMAL LOW (ref 12.0–15.0)
Hemoglobin: 10.2 g/dL — ABNORMAL LOW (ref 12.0–15.0)
MCH: 32.8 pg (ref 26.0–34.0)
MCH: 33.2 pg (ref 26.0–34.0)
MCH: 32.8 pg (ref 26.0–34.0)
MCHC: 31.3 g/dL (ref 30.0–36.0)
MCHC: 31.7 g/dL (ref 30.0–36.0)
MCHC: 30.8 g/dL (ref 30.0–36.0)
MCV: 104.6 fL — ABNORMAL HIGH (ref 80.0–100.0)
MCV: 104.7 fL — ABNORMAL HIGH (ref 80.0–100.0)
MCV: 106.4 fL — ABNORMAL HIGH (ref 80.0–100.0)
Platelets: 63 10*3/uL — ABNORMAL LOW (ref 150–400)
Platelets: 56 10*3/uL — ABNORMAL LOW (ref 150–400)
Platelets: 65 10*3/uL — ABNORMAL LOW (ref 150–400)
RBC: 2.41 MIL/uL — ABNORMAL LOW (ref 3.87–5.11)
RBC: 2.56 MIL/uL — ABNORMAL LOW (ref 3.87–5.11)
RBC: 3.11 MIL/uL — ABNORMAL LOW (ref 3.87–5.11)
RDW: 16.7 % — ABNORMAL HIGH (ref 11.5–15.5)
RDW: 16.6 % — ABNORMAL HIGH (ref 11.5–15.5)
RDW: 17 % — ABNORMAL HIGH (ref 11.5–15.5)
WBC: 3.1 10*3/uL — ABNORMAL LOW (ref 4.0–10.5)
WBC: 2.8 10*3/uL — ABNORMAL LOW (ref 4.0–10.5)
WBC: 6.2 10*3/uL (ref 4.0–10.5)
nRBC: 0 % (ref 0.0–0.2)
nRBC: 0 % (ref 0.0–0.2)
nRBC: 0 % (ref 0.0–0.2)

## 2019-10-04 LAB — LACTIC ACID, PLASMA: Lactic Acid, Venous: 2.3 mmol/L (ref 0.5–1.9)

## 2019-10-04 LAB — CREATININE, SERUM
Creatinine, Ser: 3.82 mg/dL — ABNORMAL HIGH (ref 0.44–1.00)
GFR calc Af Amer: 12 mL/min — ABNORMAL LOW (ref >60)
GFR calc non Af Amer: 11 mL/min — ABNORMAL LOW (ref >60)

## 2019-10-04 LAB — SARS CORONAVIRUS 2 (TAT 6-24 HRS): SARS Coronavirus 2: NEGATIVE

## 2019-10-04 MED ORDER — SODIUM CHLORIDE 0.9 % IV SOLN: 100.0000 mL | INTRAVENOUS | Status: DC | PRN

## 2019-10-04 MED ORDER — ALTEPLASE 2 MG IJ SOLR
2.0000 mg | Freq: Once | INTRAMUSCULAR | Status: DC | PRN
  Filled 2019-10-04: qty 2

## 2019-10-04 MED ORDER — LIDOCAINE-PRILOCAINE 2.5-2.5 % EX CREA: 1.0000 "application " | TOPICAL_CREAM | CUTANEOUS | Status: DC | PRN

## 2019-10-04 MED ORDER — LIDOCAINE HCL (PF) 1 % IJ SOLN: 5.0000 mL | INTRAMUSCULAR | Status: DC | PRN

## 2019-10-04 MED ORDER — SODIUM CHLORIDE 0.9 % IV BOLUS
250.0000 mL | Freq: Once | INTRAVENOUS | Status: AC
  Administered 2019-10-04: 250 mL via INTRAVENOUS

## 2019-10-04 MED ORDER — HALOPERIDOL LACTATE 5 MG/ML IJ SOLN
1.0000 mg | Freq: Four times a day (QID) | INTRAMUSCULAR | Status: DC | PRN
  Administered 2019-10-04 – 2019-10-08 (×3): 1 mg via INTRAVENOUS
  Filled 2019-10-04 (×3): qty 1

## 2019-10-04 MED ORDER — DOXERCALCIFEROL 4 MCG/2ML IV SOLN
1.0000 ug | INTRAVENOUS | Status: DC
  Administered 2019-10-07 (×2): 1 ug via INTRAVENOUS
  Filled 2019-10-04 (×5): qty 2

## 2019-10-04 MED ORDER — SODIUM CHLORIDE 0.9 % IV BOLUS
500.0000 mL | Freq: Once | INTRAVENOUS | Status: AC
  Administered 2019-10-04: 500 mL via INTRAVENOUS

## 2019-10-04 MED ORDER — DIALYVITE 3000 3 MG PO TABS: 1.0000 | ORAL_TABLET | Freq: Every day | ORAL | Status: DC

## 2019-10-04 MED ORDER — DEXTROSE 50 % IV SOLN
INTRAVENOUS | Status: AC
  Filled 2019-10-04: qty 50

## 2019-10-04 MED ORDER — RENA-VITE PO TABS
1.0000 | ORAL_TABLET | Freq: Every day | ORAL | Status: DC
  Administered 2019-10-05 – 2019-10-07 (×4): 1 via ORAL
  Filled 2019-10-04 (×10): qty 1

## 2019-10-04 MED ORDER — CINACALCET HCL 30 MG PO TABS
30.0000 mg | ORAL_TABLET | Freq: Every day | ORAL | Status: DC
  Administered 2019-10-05 – 2019-10-08 (×4): 30 mg via ORAL
  Filled 2019-10-04 (×9): qty 1

## 2019-10-04 MED ORDER — DEXTROSE 50 % IV SOLN
25.0000 g | INTRAVENOUS | Status: AC
  Administered 2019-10-04: 25 g via INTRAVENOUS

## 2019-10-04 MED ORDER — DARBEPOETIN ALFA 100 MCG/0.5ML IJ SOSY
100.0000 ug | PREFILLED_SYRINGE | INTRAMUSCULAR | Status: DC
  Filled 2019-10-04 (×2): qty 0.5

## 2019-10-04 MED ORDER — PENTAFLUOROPROP-TETRAFLUOROETH EX AERO: 1.0000 "application " | INHALATION_SPRAY | CUTANEOUS | Status: DC | PRN

## 2019-10-04 MED ORDER — CHLORHEXIDINE GLUCONATE CLOTH 2 % EX PADS
6.0000 | MEDICATED_PAD | Freq: Every day | CUTANEOUS | Status: DC
  Administered 2019-10-05: 6 via TOPICAL

## 2019-10-04 MED ORDER — SODIUM CHLORIDE 0.9 % IV SOLN
100.0000 mL | INTRAVENOUS | Status: DC | PRN
  Administered 2019-10-04: 100 mL via INTRAVENOUS

## 2019-10-04 MED ORDER — HEPARIN SODIUM (PORCINE) 1000 UNIT/ML DIALYSIS: 1000.0000 [IU] | INTRAMUSCULAR | Status: DC | PRN

## 2019-10-04 MED ORDER — HALOPERIDOL LACTATE 5 MG/ML IJ SOLN
2.0000 mg | Freq: Once | INTRAMUSCULAR | Status: AC
  Administered 2019-10-04: 2 mg via INTRAVENOUS
  Filled 2019-10-04: qty 1

## 2019-10-04 NOTE — ED Notes (Signed)
This RN spoke to pt's daughter, [name], with update at [phone].

## 2019-10-04 NOTE — ED Notes (Signed)
ED TO INPATIENT HANDOFF REPORT  ED Nurse Name and Phone #: Rayan Ines [phone]  S Name/Age/Gender [name] [name] 78 y.o. female Room/Bed: 041C/041C  Code Status   Code Status: Full Code  Home/SNF/Other Home Patient oriented to: self Is this baseline? No   Triage Complete: Triage complete  Chief Complaint Heart palp  Triage Note Pt confused as to events.  States her daughter and doctor were involved and EMS was called.  Pt was up all night without specific sx identified.  Hx of fall approx 3 weeks ago with possible LOC/head injury.  Hx of dialysis with M/W/F schedule.  Pt unsure if her BP was low yesterday.  She does report currently that she has Low blood, and heart fluttering, bilateral leg pain and R ankle pain.  NAD or dyspnea noted.  Pt is hypotensive with HR of 86   Allergies Allergies  Allergen Reactions  . Hydralazine Itching  . Robaxin [Methocarbamol] Other (See Comments)    Dizziness     Level of Care/Admitting Diagnosis ED Disposition    ED Disposition Condition [facility] Area: [place] [[id number]]  Level of Care: Progressive [102]  Admit to Progressive based on following criteria: NEPHROLOGY stable condition requiring close monitoring for AKI, requiring Hemodialysis or Peritoneal Dialysis either from expected electrolyte imbalance, acidosis, or fluid overload that can be managed by NIPPV or high flow oxygen.  Covid Evaluation: Asymptomatic Screening Protocol (No Symptoms)  Diagnosis: AMS (altered mental status) [id number]  Admitting Physician: Elwyn Reach [[id number]]  Attending Physician: Elwyn Reach [[id number]]  Estimated length of stay: past midnight tomorrow  Certification:: I certify this patient will need inpatient services for at least 2 midnights  PT Class (Do Not Modify): Inpatient [101]  PT Acc Code (Do Not Modify): Private [1]       B Medical/Surgery History Past Medical History:  Diagnosis Date  . DEPRESSION    . DIABETES MELLITUS, TYPE I   . ESRD (end stage renal disease) on dialysis ([place])    MWF HD  . GERD   . GLAUCOMA   . Headache(784.0)   . Hepatitis B carrier ([place])    05/2009: neg Hep C; Hep B: core pos, Surf neg; fatty liver US 8/10 - 7/13  . HYPERLIPIDEMIA   . HYPERTENSION   . Memory deficit   . OSTEOPENIA   . POSTMENOPAUSAL STATUS   . Pulmonary edema   . Unspecified vitamin D deficiency    Past Surgical History:  Procedure Laterality Date  . A/V SHUNTOGRAM N/A 09/06/2018   Procedure: A/V SHUNTOGRAM - Left AV;  Surgeon: Marty Heck, MD;  Location: [facility];  Service: Cardiovascular;  Laterality: N/A;  . A/V SHUNTOGRAM Left 06/13/2019   Procedure: A/V SHUNTOGRAM;  Surgeon: Marty Heck, MD;  Location: [facility];  Service: Cardiovascular;  Laterality: Left;  . AV FISTULA PLACEMENT Left 05/29/2014   Procedure: INSERTION OF ARTERIOVENOUS (AV) GORE-TEX GRAFT ARM-LEFT;  Surgeon: Angelia Mould, MD;  Location: [facility];  Service: Vascular;  Laterality: Left;  . CHOLECYSTECTOMY  02/12/07  . INSERTION OF DIALYSIS CATHETER Right 05/27/2014   Procedure: INSERTION OF DIALYSIS CATHETER;  Surgeon: Angelia Mould, MD;  Location: [facility];  Service: Vascular;  Laterality: Right;  . IR DIALY SHUNT INTRO [place] W/IMG LEFT Left 12/14/2018  . PERIPHERAL VASCULAR BALLOON ANGIOPLASTY Left 09/06/2018   Procedure: PERIPHERAL VASCULAR BALLOON ANGIOPLASTY;  Surgeon: Marty Heck, MD;  Location: [facility]  INVASIVE [facility] LAB;  Service: Cardiovascular;  Laterality: Left;  arm shunt  . PERIPHERAL VASCULAR BALLOON ANGIOPLASTY  06/13/2019   Procedure: PERIPHERAL VASCULAR BALLOON ANGIOPLASTY;  Surgeon: Marty Heck, MD;  Location: [facility];  Service: Cardiovascular;;  . REFRACTIVE SURGERY  07/29/09   Dr. Bing Plume  . TONSILLECTOMY       A IV Location/Drains/Wounds Patient Lines/Drains/Airways Status   Active Line/Drains/Airways    Name:    Placement date:   Placement time:   Site:   Days:   Peripheral IV 10/03/19 Right Wrist   10/03/19    1515    Wrist   1   Fistula / Graft Left Upper arm Arteriovenous vein graft   05/29/14    1236    Upper arm   1954   Fistula / Graft Left Upper arm   -    -    Upper arm      Fistula / Graft Left Upper arm   09/20/19    1648    Upper arm   14   Incision (Closed) 05/27/14 Neck Right   05/27/14    1102     1956   Incision (Closed) 05/27/14 Neck Right   05/27/14    1103     1956   Incision (Closed) 05/29/14 Arm Left   05/29/14    1241     1954          Intake/Output Last 24 hours  Intake/Output Summary (Last 24 hours) at 10/04/2019 1322 Last data filed at 10/03/2019 1640 Gross per 24 hour  Intake 500 ml  Output -  Net 500 ml    Labs/Imaging Results for orders placed or performed during the hospital encounter of 10/03/19 (from the past 48 hour(s))  Comprehensive metabolic panel     Status: Abnormal   Collection Time: 10/03/19  4:00 PM  Result Value Ref Range   Sodium 139 135 - 145 mmol/L   Potassium 4.4 3.5 - 5.1 mmol/L   Chloride 98 98 - 111 mmol/L   CO2 32 22 - 32 mmol/L   Glucose, Bld 113 (H) 70 - 99 mg/dL   BUN 12 8 - 23 mg/dL   Creatinine, Ser 3.51 (H) 0.44 - 1.00 mg/dL   Calcium 7.8 (L) 8.9 - 10.3 mg/dL   Total Protein 6.9 6.5 - 8.1 g/dL   Albumin 2.6 (L) 3.5 - 5.0 g/dL   AST 55 (H) 15 - 41 U/L   ALT 26 0 - 44 U/L   Alkaline Phosphatase 141 (H) 38 - 126 U/L   Total Bilirubin 0.9 0.3 - 1.2 mg/dL   GFR calc non Af Amer 12 (L) >60 mL/min   GFR calc Af Amer 14 (L) >60 mL/min   Anion gap 9 5 - 15    Comment: Performed at [facility], [address] [place], [place] [address]  CBC with Differential     Status: Abnormal   Collection Time: 10/03/19  4:00 PM  Result Value Ref Range   WBC 2.6 (L) 4.0 - 10.5 K/uL   RBC 2.76 (L) 3.87 - 5.11 MIL/uL   Hemoglobin 9.1 (L) 12.0 - 15.0 g/dL   HCT 29.0 (L) 36.0 - 46.0 %   MCV 105.1 (H) 80.0 - 100.0 fL   MCH 33.0 26.0 - 34.0  pg   MCHC 31.4 30.0 - 36.0 g/dL   RDW 16.7 (H) 11.5 - 15.5 %   Platelets 62 (L) 150 - 400 K/uL  Comment: REPEATED TO VERIFY PLATELET COUNT CONFIRMED BY SMEAR SPECIMEN CHECKED FOR CLOTS Immature Platelet Fraction may be clinically indicated, consider ordering this additional test [id number]    nRBC 0.0 0.0 - 0.2 %   Neutrophils Relative % 71 %   Neutro Abs 1.9 1.7 - 7.7 K/uL   Lymphocytes Relative 17 %   Lymphs Abs 0.4 (L) 0.7 - 4.0 K/uL   Monocytes Relative 7 %   Monocytes Absolute 0.2 0.1 - 1.0 K/uL   Eosinophils Relative 4 %   Eosinophils Absolute 0.1 0.0 - 0.5 K/uL   Basophils Relative 1 %   Basophils Absolute 0.0 0.0 - 0.1 K/uL   Immature Granulocytes 0 %   Abs Immature Granulocytes 0.01 0.00 - 0.07 K/uL    Comment: Performed at [facility], [address], [place], [place] [address]  Troponin I (High Sensitivity)     Status: Abnormal   Collection Time: 10/03/19  4:00 PM  Result Value Ref Range   Troponin I (High Sensitivity) 651 (HH) <18 ng/L    Comment: CRITICAL RESULT CALLED TO, READ BACK BY AND VERIFIED WITH: K.NEWNAM,RN 1731 10/03/2019 CLARK,S Performed at [facility], [place] [address] [place], [place] [address]   Troponin I (High Sensitivity)     Status: Abnormal   Collection Time: 10/03/19  5:57 PM  Result Value Ref Range   Troponin I (High Sensitivity) 675 (HH) <18 ng/L    Comment: CRITICAL VALUE NOTED.  VALUE IS CONSISTENT WITH PREVIOUSLY REPORTED AND CALLED VALUE. (NOTE) Elevated high sensitivity troponin I (hsTnI) values and significant  changes across serial measurements may suggest ACS but many other  chronic and acute conditions are known to elevate hsTnI results.  Refer to the Links section for chest pain algorithms and additional  guidance. Performed at [facility], [place] [address] [place], [place] [address]   Urinalysis, Routine w reflex microscopic     Status: Abnormal   Collection Time: 10/03/19  8:14 PM  Result Value Ref  Range   Color, Urine YELLOW YELLOW   APPearance CLOUDY (A) CLEAR   Specific Gravity, Urine 1.025 1.005 - 1.030   pH 5.5 5.0 - 8.0   Glucose, UA NEGATIVE NEGATIVE mg/dL   Hgb urine dipstick MODERATE (A) NEGATIVE   Bilirubin Urine MODERATE (A) NEGATIVE   Ketones, ur 15 (A) NEGATIVE mg/dL   Protein, ur >300 (A) NEGATIVE mg/dL   Nitrite NEGATIVE NEGATIVE   Leukocytes,Ua MODERATE (A) NEGATIVE    Comment: Performed at [facility] [address], [place], [place] [address]  Urinalysis, Microscopic (reflex)     Status: Abnormal   Collection Time: 10/03/19  8:14 PM  Result Value Ref Range   RBC / HPF 6-10 0 - 5 RBC/hpf   WBC, UA 11-20 0 - 5 WBC/hpf   Bacteria, UA MANY (A) NONE SEEN   Squamous Epithelial / LPF 0-5 0 - 5    Comment: Performed at [facility], [place] [address] [place], [place] [address]  Lactic acid, plasma     Status: None   Collection Time: 10/03/19  8:54 PM  Result Value Ref Range   Lactic Acid, Venous 1.8 0.5 - 1.9 mmol/L    Comment: Performed at [facility] [place] [address], [place], [place] [address]  Culture, blood (routine x 2)     Status: None (Preliminary result)   Collection Time: 10/03/19  8:54 PM   Specimen: BLOOD  Result Value Ref Range  Specimen Description BLOOD RIGHT ARM    Special Requests      BOTTLES DRAWN AEROBIC AND ANAEROBIC Blood Culture adequate volume   Culture      NO GROWTH < 12 HOURS Performed at [facility], [place] [address], [place], [place] [address]    Report Status PENDING   APTT     Status: None   Collection Time: 10/03/19  8:54 PM  Result Value Ref Range   aPTT 32 24 - 36 seconds    Comment: Performed at [facility], [place] [address], [place], [place] [address]  Protime-INR     Status: Abnormal   Collection Time: 10/03/19  8:54 PM  Result Value Ref Range   Prothrombin Time 15.7 (H) 11.4 - 15.2 seconds   INR 1.3 (H) 0.8 - 1.2    Comment: (NOTE) INR goal varies based on device and disease  states. Performed at [facility], [place] [address], [place], [place] [address]   Lactic acid, plasma     Status: Abnormal   Collection Time: 10/04/19 12:35 AM  Result Value Ref Range   Lactic Acid, Venous 2.3 (HH) 0.5 - 1.9 mmol/L    Comment: CRITICAL RESULT CALLED TO, READ BACK BY AND VERIFIED WITH: TOWNES [facility] 10/04/19 0133 WAYK Performed at [facility], [place] [address], [place], [place] [address]   CBC     Status: Abnormal   Collection Time: 10/04/19 12:35 AM  Result Value Ref Range   WBC 3.1 (L) 4.0 - 10.5 K/uL   RBC 2.41 (L) 3.87 - 5.11 MIL/uL   Hemoglobin 7.9 (L) 12.0 - 15.0 g/dL   HCT 25.2 (L) 36.0 - 46.0 %   MCV 104.6 (H) 80.0 - 100.0 fL   MCH 32.8 26.0 - 34.0 pg   MCHC 31.3 30.0 - 36.0 g/dL   RDW 16.7 (H) 11.5 - 15.5 %   Platelets 63 (L) 150 - 400 K/uL    Comment: REPEATED TO VERIFY Immature Platelet Fraction may be clinically indicated, consider ordering this additional test [id number] CONSISTENT WITH PREVIOUS RESULT    nRBC 0.0 0.0 - 0.2 %    Comment: Performed at [facility], [place] [address] [place], [place] [address]  Creatinine, serum     Status: Abnormal   Collection Time: 10/04/19 12:35 AM  Result Value Ref Range   Creatinine, Ser 3.82 (H) 0.44 - 1.00 mg/dL   GFR calc non Af Amer 11 (L) >60 mL/min   GFR calc Af Amer 12 (L) >60 mL/min    Comment: Performed at [facility] [address] [place], [place] [address]  SARS CORONAVIRUS 2 (TAT 6-24 HRS) Nasopharyngeal Nasopharyngeal Swab     Status: None   Collection Time: 10/04/19 12:40 AM   Specimen: Nasopharyngeal Swab  Result Value Ref Range   SARS Coronavirus 2 NEGATIVE NEGATIVE    Comment: (NOTE) SARS-CoV-2 target nucleic acids are NOT DETECTED. The SARS-CoV-2 RNA is generally detectable in upper and lower respiratory specimens during the acute phase of infection. Negative results do not preclude SARS-CoV-2 infection, do not rule out co-infections with other pathogens, and  should not be used as the sole basis for treatment or other patient management decisions. Negative results must be combined with clinical observations, patient history, and epidemiological information. The expected result is Negative. Fact Sheet for Patients: SugarRoll.be Fact Sheet for Healthcare Providers: https://www.woods-mathews.com/ This test is not yet approved or cleared by the [place] FDA and  has been authorized for detection and/or diagnosis of SARS-CoV-2 by FDA under an Emergency Use Authorization (EUA). This EUA will remain  in effect (meaning this test can be used) for the duration of the COVID-19 declaration under Section 56 4(b)(1) of the Act, 21 U.S.C. section 360bbb-3(b)(1), unless the authorization is terminated or revoked sooner. Performed at [facility], [place] [address], [place], [place] [address]   CBC     Status: Abnormal   Collection Time: 10/04/19  2:07 AM  Result Value Ref Range   WBC 2.8 (L) 4.0 - 10.5 K/uL   RBC 2.56 (L) 3.87 - 5.11 MIL/uL   Hemoglobin 8.5 (L) 12.0 - 15.0 g/dL   HCT 26.8 (L) 36.0 - 46.0 %   MCV 104.7 (H) 80.0 - 100.0 fL   MCH 33.2 26.0 - 34.0 pg   MCHC 31.7 30.0 - 36.0 g/dL   RDW 16.6 (H) 11.5 - 15.5 %   Platelets 56 (L) 150 - 400 K/uL    Comment: REPEATED TO VERIFY Immature Platelet Fraction may be clinically indicated, consider ordering this additional test [id number] CONSISTENT WITH PREVIOUS RESULT    nRBC 0.0 0.0 - 0.2 %    Comment: Performed at [facility], [place] [address], [place], [place] [address]  Renal function panel     Status: Abnormal   Collection Time: 10/04/19  2:07 AM  Result Value Ref Range   Sodium 141 135 - 145 mmol/L   Potassium 3.7 3.5 - 5.1 mmol/L   Chloride 102 98 - 111 mmol/L   CO2 27 22 - 32 mmol/L   Glucose, Bld 128 (H) 70 - 99 mg/dL   BUN 14 8 - 23 mg/dL   Creatinine, Ser 3.79 (H) 0.44 - 1.00 mg/dL   Calcium 7.6 (L) 8.9 - 10.3 mg/dL    Phosphorus 3.6 2.5 - 4.6 mg/dL   Albumin 2.3 (L) 3.5 - 5.0 g/dL   GFR calc non Af Amer NOT CALCULATED >60 mL/min   GFR calc Af Amer NOT CALCULATED >60 mL/min   Anion gap 12 5 - 15    Comment: Performed at [facility] [address] [place], [place] [address]   Ct Head Wo Contrast  Result Date: 10/03/2019 CLINICAL DATA:  Altered level of consciousness (LOC), unexplained EXAM: CT HEAD WITHOUT CONTRAST TECHNIQUE: Contiguous axial images were obtained from the base of the skull through the vertex without intravenous contrast. COMPARISON:  Head CT 07-01-202019 FINDINGS: Brain: No evidence of acute infarction, hemorrhage, extra-axial collection or mass lesion/mass effect. Stable degree of atrophy. Ventricular prominence is unchanged, likely due to central atrophy. There is mild chronic small vessel ischemia. Scattered streak artifact from hair clips. Vascular: Atherosclerosis of skullbase vasculature without hyperdense vessel or abnormal calcification. Skull: No fracture or focal lesion. Sinuses/Orbits: Paranasal sinuses and mastoid air cells are clear. The visualized orbits are unremarkable. Bilateral cataract resection. Other: None. IMPRESSION: 1. No acute intracranial abnormality. 2. Stable atrophy and chronic small vessel ischemia since 2019. Electronically Signed   By: Keith Rake M.D.   On: 10/03/2019 18:42   Dg Chest Portable 1 View  Result Date: 10/03/2019 CLINICAL DATA:  Altered mental status EXAM: PORTABLE CHEST 1 VIEW COMPARISON:  December 13, 2018 FINDINGS: Small to moderate right pleural effusion and adjacent atelectasis/consolidation. Stable cardiomediastinal contours. No pneumothorax. IMPRESSION: Small to moderate right pleural effusion with adjacent atelectasis/consolidation. Electronically Signed   By: Macy Mis M.D.   On: 10/03/2019 16:34    Pending Labs  Unresulted Labs (From admission, onward)    Start     Ordered   10/04/19 1134  Culture, Urine  ONCE - STAT,   STAT      10/04/19 1133   10/04/19 0951  Renal function panel  Once,   STAT     10/04/19 0950   10/04/19 0951  CBC  Once,   STAT     10/04/19 0950   10/03/19 2030  Culture, blood (routine x 2)  BLOOD CULTURE X 2,   STAT     10/03/19 2029   10/03/19 2030  Urine culture  ONCE - STAT,   STAT     10/03/19 2029          Vitals/Pain Today's Vitals   10/04/19 0941 10/04/19 0948 10/04/19 1000 10/04/19 1239  BP:  114/63 118/84 95/64  Pulse:  75 76 82  Resp:  19  18  Temp:      TempSrc:      SpO2:  96% 96% 96%  Weight:      Height:      PainSc: Asleep       Isolation Precautions No active isolations  Medications Medications  acetaminophen (TYLENOL) tablet 650 mg (has no administration in time range)    Or  acetaminophen (TYLENOL) suppository 650 mg (has no administration in time range)  zolpidem (AMBIEN) tablet 5 mg (has no administration in time range)  camphor-menthol (SARNA) lotion 1 application (has no administration in time range)    And  hydrOXYzine (ATARAX/VISTARIL) tablet 25 mg (has no administration in time range)  sorbitol 70 % solution 30 mL (has no administration in time range)  docusate sodium (ENEMEEZ) enema 283 mg (has no administration in time range)  ondansetron (ZOFRAN) tablet 4 mg (has no administration in time range)    Or  ondansetron (ZOFRAN) injection 4 mg (has no administration in time range)  calcium carbonate (dosed in mg elemental calcium) suspension 500 mg of elemental calcium (has no administration in time range)  feeding supplement (NEPRO CARB STEADY) liquid 237 mL (has no administration in time range)  heparin injection 5,000 Units (5,000 Units Subcutaneous Not Given 10/04/19 0500)  0.9 %  sodium chloride infusion ( Intravenous New Bag/Given 10/04/19 0039)  cefTRIAXone (ROCEPHIN) 1 g in sodium chloride 0.9 % 100 mL IVPB (0 g Intravenous Stopped 10/04/19 0115)  Chlorhexidine Gluconate Cloth 2 % PADS 6 each (has no administration in time range)   pentafluoroprop-tetrafluoroeth (GEBAUERS) aerosol 1 application (has no administration in time range)  lidocaine (PF) (XYLOCAINE) 1 % injection 5 mL (has no administration in time range)  lidocaine-prilocaine (EMLA) cream 1 application (has no administration in time range)  0.9 %  sodium chloride infusion (100 mLs Intravenous New Bag/Given 10/04/19 1250)  0.9 %  sodium chloride infusion (has no administration in time range)  heparin injection 1,000 Units (has no administration in time range)  alteplase (CATHFLO ACTIVASE) injection 2 mg (has no administration in time range)  Darbepoetin Alfa (ARANESP) injection 100 mcg (has no administration in time range)  doxercalciferol (HECTOROL) injection 1 mcg (has no administration in time range)  haloperidol lactate (HALDOL) injection 1 mg (1 mg Intravenous Given 10/04/19 1140)  cinacalcet (SENSIPAR) tablet 30 mg (has no administration in time range)  multivitamin (RENA-VIT) tablet 1 tablet (has no administration in time range)  sodium chloride 0.9 % bolus 500 mL (0 mLs Intravenous Stopped 10/03/19 1640)  sodium chloride 0.9 % bolus 500 mL (0 mLs Intravenous Stopped 10/03/19 1930)  lactated ringers bolus 1,000 mL (0 mLs Intravenous Stopping Infusion hung by another clincian 10/04/19 0751)  ceFEPIme (MAXIPIME) 2 g in sodium chloride 0.9 % 100 mL IVPB (0 g Intravenous Stopped 10/03/19 2200)  haloperidol lactate (HALDOL) injection 2 mg (2 mg Intravenous Given 10/04/19 0345)    Mobility walks with person assist High fall risk   Focused Assessments Neuro Assessment Handoff:  Swallow screen pass? Yes  Cardiac Rhythm: Normal sinus rhythm       Neuro Assessment:   Neuro Checks:      Last Documented NIHSS Modified Score:   Has TPA been given? No If patient is a Neuro Trauma and patient is going to OR before floor call report to [place] nurse: [phone] or [phone]     R Recommendations: See Admitting Provider Note  Report given to:    Additional Notes:

## 2019-10-04 NOTE — ED Notes (Signed)
Admitting providers at bedside

## 2019-10-04 NOTE — Progress Notes (Signed)
BP 81/62 after return from HD.  Notified [facility] oncall.

## 2019-10-04 NOTE — Consult Note (Signed)
Referring Provider: No ref. provider found Primary Care Physician:  Carollee Herter, Alferd Apa, DO Primary Nephrologist:  Dr.  Hollie Salk  Reason for Consultation: Medical management of end-stage renal disease, maintenance of euvolemia, treatment and evaluation of anemia, treatment evaluation secondary hyperparathyroidism  HPI: 78 year old lady with history of end-stage renal disease Monday Wednesday Friday dialysis secondary to diabetes.  She also has depression gastroesophageal reflux disease noncompliance and hypertension.  She was brought in by family due to altered mental status and complaints of pain left leg which is nonspecific.  She was admitted 10/03/2019 and will receive her regular dialysis treatments.  Blood pressure 114/63 pulse 75   O2 sats 96% room air temperature 97.8  Sodium 141 potassium 3.7 chloride 102 CO2 27 BUN 14 creatinine 3.79 glucose 128 calcium 7.6 phosphorus 3.6 albumin 2.3 WBC 2.8 hemoglobin 8.5 platelets 56.  Darbepoetin 100 mcg every Friday Hectorol 1 mcg Monday Wednesday Friday  Medications provided on epic.  Cinacalcet 30 mg daily carvedilol 25 mg twice daily Lasix 40 mg every other day isosorbide 30 mg daily Renvela 1.6 g 2 tablets 3 times daily with meals  Past Medical History:  Diagnosis Date  . DEPRESSION   . DIABETES MELLITUS, TYPE I   . ESRD (end stage renal disease) on dialysis ([place])    MWF HD  . GERD   . GLAUCOMA   . Headache(784.0)   . Hepatitis B carrier ([place])    05/2009: neg Hep C; Hep B: core pos, Surf neg; fatty liver US 8/10 - 7/13  . HYPERLIPIDEMIA   . HYPERTENSION   . Memory deficit   . OSTEOPENIA   . POSTMENOPAUSAL STATUS   . Pulmonary edema   . Unspecified vitamin D deficiency     Past Surgical History:  Procedure Laterality Date  . A/V SHUNTOGRAM N/A 09/06/2018   Procedure: A/V SHUNTOGRAM - Left AV;  Surgeon: Marty Heck, MD;  Location: [facility];  Service: Cardiovascular;  Laterality: N/A;  . A/V SHUNTOGRAM Left  06/13/2019   Procedure: A/V SHUNTOGRAM;  Surgeon: Marty Heck, MD;  Location: [facility];  Service: Cardiovascular;  Laterality: Left;  . AV FISTULA PLACEMENT Left 05/29/2014   Procedure: INSERTION OF ARTERIOVENOUS (AV) GORE-TEX GRAFT ARM-LEFT;  Surgeon: Angelia Mould, MD;  Location: [facility];  Service: Vascular;  Laterality: Left;  . CHOLECYSTECTOMY  02/12/07  . INSERTION OF DIALYSIS CATHETER Right 05/27/2014   Procedure: INSERTION OF DIALYSIS CATHETER;  Surgeon: Angelia Mould, MD;  Location: [facility];  Service: Vascular;  Laterality: Right;  . IR DIALY SHUNT INTRO [place] W/IMG LEFT Left 12/14/2018  . PERIPHERAL VASCULAR BALLOON ANGIOPLASTY Left 09/06/2018   Procedure: PERIPHERAL VASCULAR BALLOON ANGIOPLASTY;  Surgeon: Marty Heck, MD;  Location: [facility];  Service: Cardiovascular;  Laterality: Left;  arm shunt  . PERIPHERAL VASCULAR BALLOON ANGIOPLASTY  06/13/2019   Procedure: PERIPHERAL VASCULAR BALLOON ANGIOPLASTY;  Surgeon: Marty Heck, MD;  Location: [facility];  Service: Cardiovascular;;  . REFRACTIVE SURGERY  07/29/09   Dr. Bing Plume  . TONSILLECTOMY      Prior to Admission medications   Medication Sig Start Date End Date Taking? Authorizing Provider  acetaminophen (TYLENOL) 325 MG tablet Take 650 mg by mouth every 6 (six) hours as needed for mild pain.   Yes [provider]  carvedilol (COREG) 25 MG tablet Take 1 tablet (25 mg total) by mouth 2 (two) times daily with a meal. 07/04/19  Yes Lowne  Koren Shiver, DO  cinacalcet (SENSIPAR) 30 MG tablet Take 30 mg by mouth daily.  06/05/19  Yes [provider]  folic acid-vitamin b complex-vitamin c-selenium-zinc (DIALYVITE) 3 MG TABS tablet Take 1 tablet by mouth daily.   Yes [provider]  furosemide (LASIX) 40 MG tablet TAKE 1 TABLET BY MOUTH EVERY DAY Patient taking differently: Take 40 mg by mouth daily.  06/04/19  Yes Roma Schanz R,  DO  isosorbide mononitrate (IMDUR) 30 MG 24 hr tablet TAKE 1 TABLET (30 MG TOTAL) BY MOUTH 2 (TWO) TIMES DAILY. 09/30/19  Yes Roma Schanz R, DO  nitroGLYCERIN (NITROSTAT) 0.4 MG SL tablet Place 1 tablet (0.4 mg total) under the tongue every 5 (five) minutes as needed for chest pain. 0000000  Yes Delora Fuel, MD  sevelamer carbonate (RENVELA) 800 MG tablet Take 2 tablets (1,600 mg total) by mouth 3 (three) times daily with meals. Patient not taking: Reported on 06/10/2019 10/18/18   Kayleen Memos, DO    Current Facility-Administered Medications  Medication Dose Route Frequency Provider Last Rate Last Dose  . 0.9 %  sodium chloride infusion   Intravenous Continuous Elwyn Reach, MD 75 mL/hr at 10/04/19 0039    . 0.9 %  sodium chloride infusion  100 mL Intravenous PRN Valentina Gu, NP      . 0.9 %  sodium chloride infusion  100 mL Intravenous PRN Valentina Gu, NP      . acetaminophen (TYLENOL) tablet 650 mg  650 mg Oral Q6H PRN Elwyn Reach, MD       Or  . acetaminophen (TYLENOL) suppository 650 mg  650 mg Rectal Q6H PRN Gala Romney L, MD      . alteplase (CATHFLO ACTIVASE) injection 2 mg  2 mg Intracatheter Once PRN Valentina Gu, NP      . calcium carbonate (dosed in mg elemental calcium) suspension 500 mg of elemental calcium  500 mg of elemental calcium Oral Q6H PRN Elwyn Reach, MD      . camphor-menthol (SARNA) lotion 1 application  1 application Topical [id number] PRN Elwyn Reach, MD       And  . hydrOXYzine (ATARAX/VISTARIL) tablet 25 mg  25 mg Oral Q8H PRN Gala Romney L, MD      . cefTRIAXone (ROCEPHIN) 1 g in sodium chloride 0.9 % 100 mL IVPB  1 g Intravenous Q24H Elwyn Reach, MD   Stopped at 10/04/19 0115  . Chlorhexidine Gluconate Cloth 2 % PADS 6 each  6 each Topical Q0600 Valentina Gu, NP      . Darbepoetin Alfa (ARANESP) injection 100 mcg  100 mcg Intravenous Q Fri-HD Valentina Gu, NP      . docusate sodium  [facility]) enema 283 mg  1 enema Rectal PRN Elwyn Reach, MD      . doxercalciferol (HECTOROL) injection 1 mcg  1 mcg Intravenous Q M,W,F-HD Valentina Gu, NP      . feeding supplement (NEPRO CARB STEADY) liquid 237 mL  237 mL Oral TID PRN Elwyn Reach, MD      . heparin injection 1,000 Units  1,000 Units Dialysis PRN Valentina Gu, NP      . heparin injection 5,000 Units  5,000 Units Subcutaneous Q8H Garba, Mohammad L, MD      . lidocaine (PF) (XYLOCAINE) 1 % injection 5 mL  5 mL Intradermal PRN Valentina Gu, NP      .  lidocaine-prilocaine (EMLA) cream 1 application  1 application Topical PRN Valentina Gu, NP      . ondansetron [facility]) tablet 4 mg  4 mg Oral Q6H PRN Elwyn Reach, MD       Or  . ondansetron (ZOFRAN) injection 4 mg  4 mg Intravenous Q6H PRN Elwyn Reach, MD      . pentafluoroprop-tetrafluoroeth (GEBAUERS) aerosol 1 application  1 application Topical PRN Valentina Gu, NP      . sorbitol 70 % solution 30 mL  30 mL Oral PRN Gala Romney L, MD      . zolpidem (AMBIEN) tablet 5 mg  5 mg Oral QHS PRN Elwyn Reach, MD       Current Outpatient Medications  Medication Sig Dispense Refill  . acetaminophen (TYLENOL) 325 MG tablet Take 650 mg by mouth every 6 (six) hours as needed for mild pain.    . carvedilol (COREG) 25 MG tablet Take 1 tablet (25 mg total) by mouth 2 (two) times daily with a meal. 60 tablet 0  . cinacalcet (SENSIPAR) 30 MG tablet Take 30 mg by mouth daily.     . folic acid-vitamin b complex-vitamin c-selenium-zinc (DIALYVITE) 3 MG TABS tablet Take 1 tablet by mouth daily.    . furosemide (LASIX) 40 MG tablet TAKE 1 TABLET BY MOUTH EVERY DAY (Patient taking differently: Take 40 mg by mouth daily. ) 90 tablet 1  . isosorbide mononitrate (IMDUR) 30 MG 24 hr tablet TAKE 1 TABLET (30 MG TOTAL) BY MOUTH 2 (TWO) TIMES DAILY. 60 tablet 0  . nitroGLYCERIN (NITROSTAT) 0.4 MG SL tablet Place 1 tablet (0.4 mg total)  under the tongue every 5 (five) minutes as needed for chest pain. 30 tablet 0  . sevelamer carbonate (RENVELA) 800 MG tablet Take 2 tablets (1,600 mg total) by mouth 3 (three) times daily with meals. (Patient not taking: Reported on 06/10/2019) 90 tablet 0    Allergies as of 10/03/2019 - Review Complete 10/03/2019  Allergen Reaction Noted  . Hydralazine Itching 01/27/2014  . Robaxin [methocarbamol] Other (See Comments) 01/27/2014    Family History  Problem Relation Age of Onset  . Kidney disease Mother   . Coronary artery disease Other   . Diabetes Other   . Hypertension Other   . Arthritis Other   . Kidney disease Sister     Social History   Socioeconomic History  . Marital status: Married    Spouse name: Not on file  . Number of children: Not on file  . Years of education: Not on file  . Highest education level: Not on file  Occupational History  . Not on file  Social Needs  . Financial resource strain: Not on file  . Food insecurity    Worry: Not on file    Inability: Not on file  . Transportation needs    Medical: Not on file    Non-medical: Not on file  Tobacco Use  . Smoking status: Never Smoker  . Smokeless tobacco: Never Used  . Tobacco comment: Married x's 75yrs, 6 kids-scattered [employer]. Retired Consulting civil engineer  Substance and Sexual Activity  . Alcohol use: No  . Drug use: No  . Sexual activity: Not Currently    Birth control/protection: None  Lifestyle  . Physical activity    Days per week: Not on file    Minutes per session: Not on file  . Stress: Not on file  Relationships  . Social connections  Talks on phone: Not on file    Gets together: Not on file    Attends religious service: Not on file    Active member of club or organization: Not on file    Attends meetings of clubs or organizations: Not on file    Relationship status: Not on file  . Intimate partner violence    Fear of current or ex partner: Not on file    Emotionally abused: Not on  file    Physically abused: Not on file    Forced sexual activity: Not on file  Other Topics Concern  . Not on file  Social History Narrative   03/06/19 Lives with daughter     Review of Systems: General weak fatigued lowish blood pressures on admission Eyes no visual loss blurred vision double vision Ears nose mouth throat no hearing loss epistaxis or sore throat Cardiovascular denies angina orthopnea ankle or leg swelling Respiratory no cough wheeze hemoptysis Abdominal system no abdominal pain nausea vomiting change in bowel habits or blood in stools Urogenital ESRD on dialysis Neurologic no stroke no seizures has had some alteration of mental state Dermatologic no skin rash or itching Endocrine history of diabetes mellitus type 1 no thyroid disease  Physical Exam: Vital signs in last 24 hours: Temp:  [97.8 F (36.6 C)] 97.8 F (36.6 C) (12/03 1535) Pulse Rate:  [36-86] 75 (12/04 0948) Resp:  [14-29] 19 (12/04 0948) BP: (77-114)/(40-102) 114/63 (12/04 0948) SpO2:  [93 %-100 %] 96 % (12/04 0948) Weight:  [59 kg] 59 kg (12/03 1536)   General: Chronically ill-appearing lady no obvious distress Head:  Normocephalic and atraumatic. Eyes:  Sclera clear, no icterus.   Conjunctiva pink. Ears:  Normal auditory acuity. Nose:  No deformity, discharge,  or lesions. Mouth:  No deformity or lesions, dentition normal. Neck:  Supple; no masses or thyromegaly. JVP not elevated Lungs:  Clear throughout to auscultation.   No wheezes, crackles, or rhonchi. No acute distress. Heart:  Regular rate and rhythm; no murmurs, clicks, rubs,  or gallops. Abdomen:  Soft, nontender and nondistended. No masses, hepatosplenomegaly or hernias noted. Normal bowel sounds, without guarding, and without rebound.   Msk:  Symmetrical without gross deformities. Normal posture. Pulses:  No carotid, renal, femoral bruits. DP and PT symmetrical and equal Extremities:  Without clubbing or edema.  Left AV  graft Neurologic: Grossly intact Skin:  Intact without significant lesions or rashes. Cervical Nodes:  No significant cervical adenopathy. Psych:  Alert and cooperative. Normal mood and affect.  Intake/Output from previous day: 12/03 0701 - 12/04 0700 In: 500 [IV Piggyback:500] Out: -  Intake/Output this shift: No intake/output data recorded.  Lab Results: Recent Labs    10/03/19 1600 10/04/19 0035 10/04/19 0207  WBC 2.6* 3.1* 2.8*  HGB 9.1* 7.9* 8.5*  HCT 29.0* 25.2* 26.8*  PLT 62* 63* 56*   BMET Recent Labs    10/03/19 1600 10/04/19 0035 10/04/19 0207  NA 139  --  141  K 4.4  --  3.7  CL 98  --  102  CO2 32  --  27  GLUCOSE 113*  --  128*  BUN 12  --  14  CREATININE 3.51* 3.82* 3.79*  CALCIUM 7.8*  --  7.6*  PHOS  --   --  3.6   LFT Recent Labs    10/03/19 1600 10/04/19 0207  PROT 6.9  --   ALBUMIN 2.6* 2.3*  AST 55*  --   ALT 26  --  ALKPHOS 141*  --   BILITOT 0.9  --    PT/INR Recent Labs    10/03/19 2054  LABPROT 15.7*  INR 1.3*   Hepatitis Panel No results for input(s): HEPBSAG, HCVAB, HEPAIGM, HEPBIGM in the last 72 hours.  Studies/Results: Ct Head Wo Contrast  Result Date: 10/03/2019 CLINICAL DATA:  Altered level of consciousness (LOC), unexplained EXAM: CT HEAD WITHOUT CONTRAST TECHNIQUE: Contiguous axial images were obtained from the base of the skull through the vertex without intravenous contrast. COMPARISON:  Head CT February 01, 202019 FINDINGS: Brain: No evidence of acute infarction, hemorrhage, extra-axial collection or mass lesion/mass effect. Stable degree of atrophy. Ventricular prominence is unchanged, likely due to central atrophy. There is mild chronic small vessel ischemia. Scattered streak artifact from hair clips. Vascular: Atherosclerosis of skullbase vasculature without hyperdense vessel or abnormal calcification. Skull: No fracture or focal lesion. Sinuses/Orbits: Paranasal sinuses and mastoid air cells are clear. The visualized  orbits are unremarkable. Bilateral cataract resection. Other: None. IMPRESSION: 1. No acute intracranial abnormality. 2. Stable atrophy and chronic small vessel ischemia since 2019. Electronically Signed   By: Keith Rake M.D.   On: 10/03/2019 18:42   Dg Chest Portable 1 View  Result Date: 10/03/2019 CLINICAL DATA:  Altered mental status EXAM: PORTABLE CHEST 1 VIEW COMPARISON:  December 13, 2018 FINDINGS: Small to moderate right pleural effusion and adjacent atelectasis/consolidation. Stable cardiomediastinal contours. No pneumothorax. IMPRESSION: Small to moderate right pleural effusion with adjacent atelectasis/consolidation. Electronically Signed   By: Macy Mis M.D.   On: 10/03/2019 16:34    Dialysis estimated dry weight 52.5 duration of treatment 3.3 potassium 2 calcium 2.25 sodium 137 bicarbonate 35 Micera 100 mcg every 2 weeks doxercalciferol 1 mcg every treatment   Assessment/Plan:  ESRD-Monday Wednesday Friday dialysis estimated fluid gains 0 to 1 L estimated dry weight 52.5.  She has an AV graft left upper extremity  ANEMIA-appears to have some pancytopenia I am curious if she has been evaluated by hematology recently.  She uses Micera  MBD-calcium and phosphorus appears to be well controlled and continues doxercalciferol 1 mcg q. treatment  HTN/VOL-appears to be controlled with very minimal fluid gains  ACCESS-AV graft    LOS: [place] @TODAY @9 :58 AM

## 2019-10-04 NOTE — Significant Event (Addendum)
Rapid Response Event Note  Overview: Hypotension in the setting of probable severe sepsis  Initial Focused Assessment: Notified by Casey Burkitt regarding pts BP in the 60s after 250 cc bolus.She notified primary svc and a NS bolus 500cc ordered by APP and infusing. Upon arrival, [name] [name] is lethargic but oriented to person, place and situation. She also stated January for the current month. She has a hx of dementia so true baseline is unknown. She also had a hypoglycemic event at 2020 and her CBG came up to 141 following D50. HR 88-90 SR with frequent PACs (personally reviewed). Skin is pink, cool and dry. Reportedly, she was hypotensive during HD also requiring IVF and reduction of UF. BBS CTA, SEM noted. She denies pain and SOB but does endorse dizzyness. +Bruit and thrill to LUE graft. No obvious source of acute blood loss or reports of bleeding. Latest h/h is 10/33.  Per chart from the son's comments, pt has increased confusion and hypotension after every dialysis treatment. After reviewing her chart, SBP 90s was documented back in November.   2230-HR 91 SR with PACs, BP 87/63 (72), RR 18 with sats 98% on RA.   Interventions: -No acute interventions by me  Plan of Care (if not transferred): -Reassess full set of VS following NS bolus -May use albumin as an alternative for volume -If hypotension persists and vasopressors required, will need PCCM and transfer to ICU -If pt has a decrease in mental status, recheck CBG  Event Summary: Call received [phone] Arrived at call 2158 Call ended 2250  Addendum: 0320-HR 74, BP 74/52 (59), RR 16 sats 98% on RA after an additional 250 cc bolus (total of 1L). BBS CTA but pt states she is SOB. Caryl Pina RN notified MD on call. Order received for transfer to ICU.   Madelynn Done

## 2019-10-04 NOTE — Progress Notes (Addendum)
[name] [name] APP - Progress Note  [name] [name] [id number] DOB: 1941/06/18 DOA: 10/03/2019 PCP: Ann Held, DO   Brief narrative: HPI: [name] [name] is a 78 y.o. female with medical history significant of end-stage renal disease on hemodialysis Mondays Wednesdays and Fridays, diabetes, depression, GERD, noncompliance, hypertension who apparently went to her hemodialysis yesterday.  In the last week or 2 she has been having weakness.  She has had some confusion with hypotension occasionally after dialysis but in the last 3 weeks since she had a fall she has been having more confusion and more weakness.  Her oral intake has also decreased.  Family brought her in because she has not been herself.  She has pain in the left and left leg and in general nonspecific complaint.  They also noticed some diaphoresis as well as patient reporting some chills.  She has alternated between being depressed to being very manic and thus not her usual self so family were worried.  Daughter is at bedside giving history.  Patient was found to have evidence of UTI and a lot of electrolyte abnormalities.  She reported that her nephrologist has noted the electrolyte abnormalities previously and recommended that she comes to the hospital for admission but she declined.  Today however she was brought in by family who insisted that she comes in and gets checked.  ED Course: Temperature is 9078 blood pressure 77/49 initially.  Pulse 82 respiratory 26 oxygen sat 94% room air.  White count is 2.6 hemoglobin 9.1 and platelet count of 62.  Sodium 139 potassium 4.4 chloride 98 CO2 32 glucose 113 BUN 12 creatinine 3.51 calcium 7.8 albumin 2.6.  Troponin is 651 and then 675 lactic acid 1.8.  PT 15.7 INR 1.2 urinalysis showed cloudy urine.  Leukocytes.  Many bacteria WBC 11-20.  Chest x-ray showed no acute findings.  Urine cultures obtained and patient has been admitted for treatment  Subjective: Patient minimally  conversant and requires much verbal and tactile stimulation to make eye contact.  Of note patient has been given Haldol sedation for recent manic and agitated behavior.  She refuses to stay in the bed but is less restless while seated in recliner.  Assessment/Plan: Principal Problem:   AMS (altered mental status) Active Problems:   Type II diabetes mellitus with nephropathy (HCC)   Hyperlipidemia LDL goal <70   Essential hypertension   GERD   ESRD (end stage renal disease) (HCC)   Sepsis ([place])   Acute lower UTI   Acute metabolic encephalopathy:  -Suspected secondary to acute infectious etiology (see below) -Continues to have issues with agitation that has responded to low-dose -Haldol so we will continue this as needed -Telemetry monitoring while receiving Haldol-follow QTC.    Sepsis with suspected urinary tract infection:  -Presented with the following sepsis physiology: Hypotension, acute delirium/altered mentation different from baseline, abnormal urinalysis, underlying lactic acidosis less than 4.0 relative hypoglycemia -Follow-up on blood cultures-obtain urine culture -Given Maxipime in the ER-continue IV Rocephin -Blood pressure continues to dip into the 80s although based on outpatient documentation baseline SBP in the 90s to 100s-patient was given a 500 cc normal saline bolus in the ER -Continue gentle IV fluid hydration at 75 cc/h -Patient appears to have chronic low white count  Chronic kidney disease stage V requiring hemodialysis  -Dialyzes MWF -Dr. Justin Mend with nephrology consulted -Primary nephrologist is Dr. Hollie Salk  Diabetes mellitus 2 diet controlled at home: -Glucose has been between 78 and 97 since  arrival -Continue to follow CBG and provide SSI  Essential hypertension:  -Blood pressure 77/49 in the ER -Continues to have some issues with suboptimal blood pressure readings for we will continue to hold carvedilol and Imdur  GERD:  -Continue  PPIs  Hyperlipidemia:  -Continue with statin    DVT prophylaxis: SQ heparin  Code Status:  Full  Family Communication:  Admitting physician spoke with patient's daughter at bedside at time of admission late yesterday evening  Disposition Plan/Expected LOS: Inpatient, patient presents with sepsis physiology as well as acute delirium/metabolic encephalopathy felt to be secondary to infection and sepsis physiology.  Patient will require inpatient hemodialysis beginning today.  Patient's mentation has not improved and she has episodes of manic and agitated behavior requiring sedation with IV Haldol therefore will also require telemetry monitoring.  Patient is currently requiring IV antibiotics and blood and urine cultures are pending  Consultants: Nephrology  Procedures: None  Cultures: Blood cultures pending Urine culture ordered but not yet obtained  Antibiotics: 12/3 IV Maxipime x1 dose in the ER 12/4 IV Rocephin >>  Objective: Blood pressure 118/84, pulse 76, temperature 97.8 F (36.6 C), temperature source Oral, resp. rate 19, height 5\' 6"  (1.676 m), weight 59 kg, SpO2 96 %.  Intake/Output Summary (Last 24 hours) at 10/04/2019 1126 Last data filed at 10/03/2019 1640 Gross per 24 hour  Intake 500 ml  Output -  Net 500 ml     Exam: Gen: No acute respiratory distress-sitting up in recliner in ER room Psych: Patient very difficult to awaken and appears sedated.  At present time no agitated or manic behavior seen.  Patient unable to adequately answer orientation questions. Chest: Clear to auscultation bilaterally without wheezes, rhonchi or crackles, room air Cardiac: Regular rate and rhythm, S1-S2, no rubs-grade 3/5 systolic murmur right sternal border second intercostal space-previous echo in 2019 demonstrated aortic sclerosis and calcification; no  gallops, no peripheral edema, no JVD Abdomen: Soft nontender nondistended without obvious hepatosplenomegaly, no  ascites Extremities: Symmetrical in appearance without cyanosis, clubbing or effusion  Scheduled Meds:  Scheduled Meds: . Chlorhexidine Gluconate Cloth  6 each Topical Q0600  . darbepoetin (ARANESP) injection - DIALYSIS  100 mcg Intravenous Q Fri-HD  . doxercalciferol  1 mcg Intravenous Q M,W,F-HD  . heparin  5,000 Units Subcutaneous Q8H   Continuous Infusions: . sodium chloride 75 mL/hr at 10/04/19 0039  . sodium chloride    . sodium chloride    . cefTRIAXone (ROCEPHIN)  IV Stopped (10/04/19 0115)    Data Reviewed: Basic Metabolic Panel: Recent Labs  Lab 10/03/19 1600 10/04/19 0035 10/04/19 0207  NA 139  --  141  K 4.4  --  3.7  CL 98  --  102  CO2 32  --  27  GLUCOSE 113*  --  128*  BUN 12  --  14  CREATININE 3.51* 3.82* 3.79*  CALCIUM 7.8*  --  7.6*  PHOS  --   --  3.6   Liver Function Tests: Recent Labs  Lab 10/03/19 1600 10/04/19 0207  AST 55*  --   ALT 26  --   ALKPHOS 141*  --   BILITOT 0.9  --   PROT 6.9  --   ALBUMIN 2.6* 2.3*   No results for input(s): LIPASE, AMYLASE in the last 168 hours. No results for input(s): AMMONIA in the last 168 hours. CBC: Recent Labs  Lab 10/03/19 1600 10/04/19 0035 10/04/19 0207  WBC 2.6* 3.1* 2.8*  NEUTROABS 1.9  --   --  HGB 9.1* 7.9* 8.5*  HCT 29.0* 25.2* 26.8*  MCV 105.1* 104.6* 104.7*  PLT 62* 63* 56*   Cardiac Enzymes: No results for input(s): CKTOTAL, CKMB, CKMBINDEX, TROPONINI in the last 168 hours. BNP (last 3 results) No results for input(s): BNP in the last 8760 hours.  ProBNP (last 3 results) No results for input(s): PROBNP in the last 8760 hours.  CBG: No results for input(s): GLUCAP in the last 168 hours.  Recent Results (from the past 240 hour(s))  Culture, blood (routine x 2)     Status: None (Preliminary result)   Collection Time: 10/03/19  8:54 PM   Specimen: BLOOD  Result Value Ref Range Status   Specimen Description BLOOD RIGHT ARM  Final   Special Requests   Final    BOTTLES  DRAWN AEROBIC AND ANAEROBIC Blood Culture adequate volume   Culture   Final    NO GROWTH < 12 HOURS Performed at [facility], [address] [place], [place] [address]    Report Status PENDING  Incomplete  SARS CORONAVIRUS 2 (TAT 6-24 HRS) Nasopharyngeal Nasopharyngeal Swab     Status: None   Collection Time: 10/04/19 12:40 AM   Specimen: Nasopharyngeal Swab  Result Value Ref Range Status   SARS Coronavirus 2 NEGATIVE NEGATIVE Final    Comment: (NOTE) SARS-CoV-2 target nucleic acids are NOT DETECTED. The SARS-CoV-2 RNA is generally detectable in upper and lower respiratory specimens during the acute phase of infection. Negative results do not preclude SARS-CoV-2 infection, do not rule out co-infections with other pathogens, and should not be used as the sole basis for treatment or other patient management decisions. Negative results must be combined with clinical observations, patient history, and epidemiological information. The expected result is Negative. Fact Sheet for Patients: SugarRoll.be Fact Sheet for Healthcare Providers: https://www.woods-mathews.com/ This test is not yet approved or cleared by the [place] FDA and  has been authorized for detection and/or diagnosis of SARS-CoV-2 by FDA under an Emergency Use Authorization (EUA). This EUA will remain  in effect (meaning this test can be used) for the duration of the COVID-19 declaration under Section 56 4(b)(1) of the Act, 21 U.S.C. section 360bbb-3(b)(1), unless the authorization is terminated or revoked sooner. Performed at [facility], [place] [address] [place], [place] [address]        Studies:  Recent x-ray studies have been reviewed in detail by the Attending Physician  Time spent :      Erin Hearing, [place] [facility] Office  [phone] Pager [phone]   **If unable to reach the above provider after paging please contact the  [place] @ [phone]  On-Call/Text Page:      [email or web address]      password [email or web address]  If 7PM-7AM, please contact night-coverage [email or web address] Password [email or web address] 10/04/2019, 11:26 AM   LOS: 1 day

## 2019-10-04 NOTE — ED Notes (Signed)
Pt's admitting floor coverage MD paged due to increase in lactic acid

## 2019-10-04 NOTE — ED Notes (Signed)
Pt confused sitting on side of bed with legs between bed railings. This tech put seizure pads on railings for pt's safety. Pt is connected back to monitor after pt pulled them off.

## 2019-10-04 NOTE — ED Notes (Signed)
Pt restful without acute chest pain noted.  States she does have a HA and currently has been medicated with tylenol.  No other needs at this time.  Updated on POC.and holding.

## 2019-10-04 NOTE — ED Notes (Addendum)
Pt becoming very confused and angry. Given 2mg  Haldol. Pt keeps sticking legs through railings. Tech assisted in placing pt back in the bed during medication admin. Bed rails up and daughter in room with pt. Daughter states pt has been acting like this during the night for 2 weeks now

## 2019-10-04 NOTE — ED Notes (Signed)
Family at bedside. 

## 2019-10-04 NOTE — Progress Notes (Signed)
Patient BP 69/38 after 250cc bolus per verbal order TRH oncall MD.

## 2019-10-04 NOTE — Progress Notes (Signed)
Hypoglycemic Event  CBG: 30  Treatment: amp of D50 per protocol  Symptoms: lethargic, confused  Follow-up CBG: Time:2020 CBG Result:141  Possible Reasons for Event: HD and lack oral intake  Comments/MD notified:TRH oncall notified after protocol initiated.     [place]

## 2019-10-04 NOTE — Progress Notes (Addendum)
TRH returned page and stated to give 500cc bolus.   Rapid response nurse notified given patients BP.  RR RN stated to give bolus per MD and update after bolus complete.   2304- BP after 500cc bolus 91/53.  Will watch BP closely.   Garnett Farm, MD.  Stated to alert if difficulty breathing or BP continued to drop.

## 2019-10-05 ENCOUNTER — Inpatient Hospital Stay (HOSPITAL_COMMUNITY): Payer: Medicare HMO

## 2019-10-05 DIAGNOSIS — R579 Shock, unspecified: Secondary | ICD-10-CM

## 2019-10-05 DIAGNOSIS — R4182 Altered mental status, unspecified: Secondary | ICD-10-CM

## 2019-10-05 DIAGNOSIS — E872 Acidosis: Secondary | ICD-10-CM | POA: Diagnosis not present

## 2019-10-05 DIAGNOSIS — K2100 Gastro-esophageal reflux disease with esophagitis, without bleeding: Secondary | ICD-10-CM

## 2019-10-05 LAB — CBC
HCT: 30.5 % — ABNORMAL LOW (ref 36.0–46.0)
HCT: 31.5 % — ABNORMAL LOW (ref 36.0–46.0)
Hemoglobin: 10.2 g/dL — ABNORMAL LOW (ref 12.0–15.0)
Hemoglobin: 9.5 g/dL — ABNORMAL LOW (ref 12.0–15.0)
MCH: 32.1 pg (ref 26.0–34.0)
MCH: 33.3 pg (ref 26.0–34.0)
MCHC: 31.1 g/dL (ref 30.0–36.0)
MCHC: 32.4 g/dL (ref 30.0–36.0)
MCV: 102.9 fL — ABNORMAL HIGH (ref 80.0–100.0)
MCV: 103 fL — ABNORMAL HIGH (ref 80.0–100.0)
Platelets: 75 10*3/uL — ABNORMAL LOW (ref 150–400)
Platelets: 84 10*3/uL — ABNORMAL LOW (ref 150–400)
RBC: 2.96 MIL/uL — ABNORMAL LOW (ref 3.87–5.11)
RBC: 3.06 MIL/uL — ABNORMAL LOW (ref 3.87–5.11)
RDW: 17.1 % — ABNORMAL HIGH (ref 11.5–15.5)
RDW: 17.6 % — ABNORMAL HIGH (ref 11.5–15.5)
WBC: 6.3 10*3/uL (ref 4.0–10.5)
WBC: 6.4 10*3/uL (ref 4.0–10.5)
nRBC: 0.6 % — ABNORMAL HIGH (ref 0.0–0.2)
nRBC: 0.8 % — ABNORMAL HIGH (ref 0.0–0.2)

## 2019-10-05 LAB — GLUCOSE, CAPILLARY
Glucose-Capillary: 125 mg/dL — ABNORMAL HIGH (ref 70–99)
Glucose-Capillary: 134 mg/dL — ABNORMAL HIGH (ref 70–99)
Glucose-Capillary: 62 mg/dL — ABNORMAL LOW (ref 70–99)
Glucose-Capillary: 72 mg/dL (ref 70–99)
Glucose-Capillary: 76 mg/dL (ref 70–99)
Glucose-Capillary: 86 mg/dL (ref 70–99)
Glucose-Capillary: 90 mg/dL (ref 70–99)
Glucose-Capillary: 93 mg/dL (ref 70–99)
Glucose-Capillary: 98 mg/dL (ref 70–99)

## 2019-10-05 LAB — COMPREHENSIVE METABOLIC PANEL
ALT: 38 U/L (ref 0–44)
AST: 80 U/L — ABNORMAL HIGH (ref 15–41)
Albumin: 2.8 g/dL — ABNORMAL LOW (ref 3.5–5.0)
Alkaline Phosphatase: 152 U/L — ABNORMAL HIGH (ref 38–126)
Anion gap: 18 — ABNORMAL HIGH (ref 5–15)
BUN: 16 mg/dL (ref 8–23)
CO2: 21 mmol/L — ABNORMAL LOW (ref 22–32)
Calcium: 8.5 mg/dL — ABNORMAL LOW (ref 8.9–10.3)
Chloride: 98 mmol/L (ref 98–111)
Creatinine, Ser: 2.83 mg/dL — ABNORMAL HIGH (ref 0.44–1.00)
GFR calc Af Amer: 18 mL/min — ABNORMAL LOW (ref >60)
GFR calc non Af Amer: 15 mL/min — ABNORMAL LOW (ref >60)
Glucose, Bld: 87 mg/dL (ref 70–99)
Potassium: 4.6 mmol/L (ref 3.5–5.1)
Sodium: 137 mmol/L (ref 135–145)
Total Bilirubin: 1.5 mg/dL — ABNORMAL HIGH (ref 0.3–1.2)
Total Protein: 7 g/dL (ref 6.5–8.1)

## 2019-10-05 LAB — LACTIC ACID, PLASMA
Lactic Acid, Venous: 8 mmol/L (ref 0.5–1.9)
Lactic Acid, Venous: 7.5 mmol/L (ref 0.5–1.9)

## 2019-10-05 LAB — MAGNESIUM: Magnesium: 1.8 mg/dL (ref 1.7–2.4)

## 2019-10-05 LAB — TSH: TSH: 4.059 u[IU]/mL (ref 0.350–4.500)

## 2019-10-05 LAB — CORTISOL: Cortisol, Plasma: 60.7 ug/dL

## 2019-10-05 LAB — MRSA PCR SCREENING: MRSA by PCR: NEGATIVE

## 2019-10-05 LAB — TROPONIN I (HIGH SENSITIVITY)
Troponin I (High Sensitivity): 649 ng/L (ref <18)
Troponin I (High Sensitivity): 614 ng/L (ref <18)

## 2019-10-05 LAB — PROCALCITONIN: Procalcitonin: 1.43 ng/mL

## 2019-10-05 LAB — PHOSPHORUS: Phosphorus: 4.5 mg/dL (ref 2.5–4.6)

## 2019-10-05 MED ORDER — VANCOMYCIN HCL 10 G IV SOLR
1250.0000 mg | Freq: Once | INTRAVENOUS | Status: AC
  Administered 2019-10-05: 1250 mg via INTRAVENOUS
  Filled 2019-10-05: qty 1250

## 2019-10-05 MED ORDER — VANCOMYCIN HCL IN DEXTROSE 500-5 MG/100ML-% IV SOLN
500.0000 mg | INTRAVENOUS | Status: DC
  Filled 2019-10-05: qty 100

## 2019-10-05 MED ORDER — NOREPINEPHRINE 4 MG/250ML-% IV SOLN
0.0000 ug/min | INTRAVENOUS | Status: DC
  Administered 2019-10-05: 2 ug/min via INTRAVENOUS
  Administered 2019-10-06: 6 ug/min via INTRAVENOUS
  Filled 2019-10-05 (×2): qty 250

## 2019-10-05 MED ORDER — HYDROCORTISONE NA SUCCINATE PF 100 MG IJ SOLR
50.0000 mg | Freq: Once | INTRAMUSCULAR | Status: AC
  Administered 2019-10-05: 50 mg via INTRAVENOUS
  Filled 2019-10-05: qty 2

## 2019-10-05 MED ORDER — LACTATED RINGERS IV BOLUS
500.0000 mL | Freq: Once | INTRAVENOUS | Status: AC
  Administered 2019-10-05: 500 mL via INTRAVENOUS

## 2019-10-05 MED ORDER — SODIUM CHLORIDE 0.9 % IV SOLN
1.0000 g | Freq: Every day | INTRAVENOUS | Status: DC
  Administered 2019-10-05 – 2019-10-09 (×5): 1 g via INTRAVENOUS
  Filled 2019-10-05 (×5): qty 1

## 2019-10-05 MED ORDER — DEXTROSE 5 % IV SOLN
INTRAVENOUS | Status: DC
  Administered 2019-10-05: 22:00:00 via INTRAVENOUS

## 2019-10-05 MED ORDER — DEXTROSE 50 % IV SOLN
12.5000 g | INTRAVENOUS | Status: AC
  Administered 2019-10-05: 03:00:00 12.5 g via INTRAVENOUS

## 2019-10-05 MED ORDER — DEXTROSE 50 % IV SOLN
INTRAVENOUS | Status: AC
  Filled 2019-10-05: qty 50

## 2019-10-05 MED ORDER — MIDODRINE HCL 5 MG PO TABS
10.0000 mg | ORAL_TABLET | Freq: Three times a day (TID) | ORAL | Status: DC
  Administered 2019-10-05 – 2019-10-09 (×12): 10 mg via ORAL
  Filled 2019-10-05 (×14): qty 2

## 2019-10-05 MED ORDER — SODIUM CHLORIDE 0.9 % IV SOLN
INTRAVENOUS | Status: DC | PRN
  Administered 2019-10-05: 250 mL via INTRAVENOUS
  Administered 2019-10-08: 19:00:00 1000 mL via INTRAVENOUS

## 2019-10-05 NOTE — Consult Note (Signed)
NAME:  [name] [name], MRN:  [id number], DOB:  [birth year], LOS: 2 ADMISSION DATE:  10/03/2019, CONSULTATION DATE:  10/05/19  CHIEF COMPLAINT:  hypotension  History of present illness   78 y.o. F with PMH of Dementia, ESRD on MWF dialysis, Type 1 DM, HL, HTN who presented on 12/3 with confusion and weakness.  Family noted decreased oral intake and diaphoresis and chills.  She does make urine and had WBC's and bacteria, so was started on Ceftriaxone.  CT head was negative for acute findings.   Starting 12/4 pt's BP started down-trending, lactic acid 2.4.  She was started on NS 75cc/hr and overnight BP as low as 56/42.  She was given a total of 2L IVF boluses and transferred to the ICU.   On exam, pt is awake and alert, states we are in the wrong room and denies complaints.  Oriented only to person.   Past Medical History   has a past medical history of DEPRESSION, DIABETES MELLITUS, TYPE I, ESRD (end stage renal disease) on dialysis ([place]), GERD, GLAUCOMA, Headache(784.0), Hepatitis B carrier ([place]), HYPERLIPIDEMIA, HYPERTENSION, Memory deficit, OSTEOPENIA, POSTMENOPAUSAL STATUS, Pulmonary edema, and Unspecified vitamin D deficiency.   Significant Hospital Events   12/3 Admit to Hospitalists  Consults:  PCCM Nephrology  Procedures:    Significant Diagnostic Tests:  12/5 CXR>> Echo>>  Micro Data:  UC>>never sent 12/3 BCx2>> 12/4 Sars-CoV-2>>negative  Antimicrobials:  Ceftriaxone 12/3-125 Cefepime 12/5- Vancomycin 12/5-  Interim history/subjective:  Pt denying lung ultrasound  Objective   Blood pressure 97/62, pulse 80, temperature 98.1 F (36.7 C), temperature source Oral, resp. rate 17, height 5' 5.5" (1.664 m), weight 59.9 kg, SpO2 98 %.        Intake/Output Summary (Last 24 hours) at 10/05/2019 0617 Last data filed at 10/05/2019 [id number] Gross per 24 hour  Intake 642 ml  Output 980 ml  Net -338 ml   Filed Weights   10/04/19 1428 10/04/19 1820 10/05/19 0500  Weight:  57.1 kg 56.9 kg 59.9 kg    General:  Thin, chronically ill appearing F in no distress HEENT: MM pink/moist Neuro: awake, alert, oriented to person only, moving all extremities CV: s1s2 rrr, no m/r/g PULM:  Decreased air movment in the bilateral bases GI: soft, bsx4 active, non-tender  Extremities: warm/dry, no edema  Skin: no rashes or lesions   Resolved Hospital Problem list     Assessment & Plan:   Hypotension, possible sepsis secondary to UTI vs PNA -initial CXR with R pleural effusion vs. infiltrate P: -Repeat CXR, broaden coverage with Cefepime/Vanc -follow BC, send UC -POCUS echo with likely biventricular dysfunction, obtain formal echo, EKG and troponin -check procalcitonin, Lactic acid, TSH and cortisol -hypoglycemic overnight, low threshold for stress dose steroids -urine strep and legionella -Levophed to maintain MAP of 65   ESRD -MWF dialysis -Given 2L IVF overnight, avoid further IVF right now   Type 1 DM -continue long aciting insulin and SS coverage   Dementia -increasingly combative after transfer, prn Haldol 1mg  -d/c Ambien   Chronic Thrombocytopenia -slightly improved on last CBC, repeat labs pending -does not appear that pt has had outpatient work-up yet   Best practice:  Diet: diabetic Pain/Anxiety/Delirium protocol (if indicated): Haldol VAP protocol (if indicated): n/a DVT prophylaxis: SCD's GI prophylaxis: n/a Glucose control: SSI Mobility: bed rest Code Status: full Family Communication: will attempt to call daughter Disposition: ICU  Labs   CBC: Recent Labs  Lab 10/03/19 1600 10/04/19 0035 10/04/19 0207 10/04/19 1315 10/05/19  0014  WBC 2.6* 3.1* 2.8* 6.2 6.3  NEUTROABS 1.9  --   --   --   --   HGB 9.1* 7.9* 8.5* 10.2* 9.5*  HCT 29.0* 25.2* 26.8* 33.1* 30.5*  MCV 105.1* 104.6* 104.7* 106.4* 103.0*  PLT 62* 63* 56* 65* 75*    Basic Metabolic Panel: Recent Labs  Lab 10/03/19 1600 10/04/19 0035 10/04/19 0207  10/04/19 1315  NA 139  --  141 142  K 4.4  --  3.7 4.4  CL 98  --  102 102  CO2 32  --  27 21*  GLUCOSE 113*  --  128* 48*  BUN 12  --  14 21  CREATININE 3.51* 3.82* 3.79* 4.17*  CALCIUM 7.8*  --  7.6* 8.4*  PHOS  --   --  3.6 4.9*   GFR: Estimated Creatinine Clearance: 10.4 mL/min (A) (by C-G formula based on SCr of 4.17 mg/dL (H)). Recent Labs  Lab 10/03/19 2054 10/04/19 0035 10/04/19 0207 10/04/19 1315 10/05/19 0014  WBC  --  3.1* 2.8* 6.2 6.3  LATICACIDVEN 1.8 2.3*  --   --   --     Liver Function Tests: Recent Labs  Lab 10/03/19 1600 10/04/19 0207 10/04/19 1315  AST 55*  --   --   ALT 26  --   --   ALKPHOS 141*  --   --   BILITOT 0.9  --   --   PROT 6.9  --   --   ALBUMIN 2.6* 2.3* 2.8*   No results for input(s): LIPASE, AMYLASE in the last 168 hours. No results for input(s): AMMONIA in the last 168 hours.  ABG    Component Value Date/Time   PHART 7.478 (H) 11/07/2017 0356   PCO2ART 42.0 11/07/2017 0356   PO2ART 101.0 11/07/2017 0356   HCO3 31.2 (H) 11/07/2017 0356   TCO2 31 06/13/2019 0712   O2SAT 98.0 11/07/2017 0356     Coagulation Profile: Recent Labs  Lab 10/03/19 2054  INR 1.3*    Cardiac Enzymes: No results for input(s): CKTOTAL, CKMB, CKMBINDEX, TROPONINI in the last 168 hours.  HbA1C: Hgb A1c MFr Bld  Date/Time Value Ref Range Status  11/15/2017 04:31 PM 4.8 4.8 - 5.6 % Final    Comment:    (NOTE) Pre diabetes:          5.7%-6.4% Diabetes:              >6.4% Glycemic control for   <7.0% adults with diabetes   11/26/2016 04:01 AM 4.7 (L) 4.8 - 5.6 % Final    Comment:    (NOTE)         Pre-diabetes: 5.7 - 6.4         Diabetes: >6.4         Glycemic control for adults with diabetes: <7.0     CBG: Recent Labs  Lab 10/04/19 2020 10/05/19 0029 10/05/19 0320 10/05/19 0354 10/05/19 0458  GLUCAP 141* 86 62* 98 93    Review of Systems:   Unable to obtain 2/2 confusion  Past Medical History  She,  has a past medical  history of DEPRESSION, DIABETES MELLITUS, TYPE I, ESRD (end stage renal disease) on dialysis ([place]), GERD, GLAUCOMA, Headache(784.0), Hepatitis B carrier ([place]), HYPERLIPIDEMIA, HYPERTENSION, Memory deficit, OSTEOPENIA, POSTMENOPAUSAL STATUS, Pulmonary edema, and Unspecified vitamin D deficiency.   Surgical History    Past Surgical History:  Procedure Laterality Date  . A/V SHUNTOGRAM N/A 09/06/2018   Procedure: A/V SHUNTOGRAM -  Left AV;  Surgeon: Marty Heck, MD;  Location: [facility];  Service: Cardiovascular;  Laterality: N/A;  . A/V SHUNTOGRAM Left 06/13/2019   Procedure: A/V SHUNTOGRAM;  Surgeon: Marty Heck, MD;  Location: [facility];  Service: Cardiovascular;  Laterality: Left;  . AV FISTULA PLACEMENT Left 05/29/2014   Procedure: INSERTION OF ARTERIOVENOUS (AV) GORE-TEX GRAFT ARM-LEFT;  Surgeon: Angelia Mould, MD;  Location: [facility];  Service: Vascular;  Laterality: Left;  . CHOLECYSTECTOMY  02/12/07  . INSERTION OF DIALYSIS CATHETER Right 05/27/2014   Procedure: INSERTION OF DIALYSIS CATHETER;  Surgeon: Angelia Mould, MD;  Location: [facility];  Service: Vascular;  Laterality: Right;  . IR DIALY SHUNT INTRO [place] W/IMG LEFT Left 12/14/2018  . PERIPHERAL VASCULAR BALLOON ANGIOPLASTY Left 09/06/2018   Procedure: PERIPHERAL VASCULAR BALLOON ANGIOPLASTY;  Surgeon: Marty Heck, MD;  Location: [facility];  Service: Cardiovascular;  Laterality: Left;  arm shunt  . PERIPHERAL VASCULAR BALLOON ANGIOPLASTY  06/13/2019   Procedure: PERIPHERAL VASCULAR BALLOON ANGIOPLASTY;  Surgeon: Marty Heck, MD;  Location: [facility];  Service: Cardiovascular;;  . REFRACTIVE SURGERY  07/29/09   Dr. Bing Plume  . TONSILLECTOMY       Social History   reports that she has never smoked. She has never used smokeless tobacco. She reports that she does not drink alcohol or use drugs.   Family History   Her family history includes  Arthritis in an other family member; Coronary artery disease in an other family member; Diabetes in an other family member; Hypertension in an other family member; Kidney disease in her mother and sister.   Allergies Allergies  Allergen Reactions  . Hydralazine Itching  . Robaxin [Methocarbamol] Other (See Comments)    Dizziness      Home Medications  Prior to Admission medications   Medication Sig Start Date End Date Taking? Authorizing Provider  acetaminophen (TYLENOL) 325 MG tablet Take 650 mg by mouth every 6 (six) hours as needed for mild pain.   Yes [provider]  carvedilol (COREG) 25 MG tablet Take 1 tablet (25 mg total) by mouth 2 (two) times daily with a meal. 07/04/19  Yes Ann Held, DO  cinacalcet (SENSIPAR) 30 MG tablet Take 30 mg by mouth daily.  06/05/19  Yes [provider]  folic acid-vitamin b complex-vitamin c-selenium-zinc (DIALYVITE) 3 MG TABS tablet Take 1 tablet by mouth daily.   Yes [provider]  furosemide (LASIX) 40 MG tablet TAKE 1 TABLET BY MOUTH EVERY DAY Patient taking differently: Take 40 mg by mouth daily.  06/04/19  Yes Roma Schanz R, DO  isosorbide mononitrate (IMDUR) 30 MG 24 hr tablet TAKE 1 TABLET (30 MG TOTAL) BY MOUTH 2 (TWO) TIMES DAILY. 09/30/19  Yes Roma Schanz R, DO  nitroGLYCERIN (NITROSTAT) 0.4 MG SL tablet Place 1 tablet (0.4 mg total) under the tongue every 5 (five) minutes as needed for chest pain. 0000000  Yes Delora Fuel, MD  sevelamer carbonate (RENVELA) 800 MG tablet Take 2 tablets (1,600 mg total) by mouth 3 (three) times daily with meals. Patient not taking: Reported on 06/10/2019 10/18/18   Kayleen Memos, DO     Critical care time: 55 minutes     CRITICAL CARE Performed by: Otilio Carpen Maleeyah Mccaughey   Total critical care time: 55 minutes  Critical care time was exclusive of separately billable procedures and treating other patients.  Critical care was necessary  to treat or prevent  imminent or life-threatening deterioration.  Critical care was time spent personally by me on the following activities: development of treatment plan with patient and/or surrogate as well as nursing, discussions with consultants, evaluation of patient's response to treatment, examination of patient, obtaining history from patient or surrogate, ordering and performing treatments and interventions, ordering and review of laboratory studies, ordering and review of radiographic studies, pulse oximetry and re-evaluation of patient's condition.   Otilio Carpen Antrice Pal, PA-C [facility]  Pager# [phone], if no answer [phone]

## 2019-10-05 NOTE — Progress Notes (Signed)
Came to room for ABG.  PT refused multiple times, I tried to explain the importance of the ABG, but pt still refuses.  RN in room and MD aware.

## 2019-10-05 NOTE — Progress Notes (Signed)
NAME:  [name] [name], MRN:  [id number], DOB:  [birth year], LOS: 2 ADMISSION DATE:  10/03/2019, CONSULTATION DATE:  10/05/19  CHIEF COMPLAINT:  hypotension  BRIEF  78 y.o. F with PMH of Dementia, ESRD on MWF dialysis, Type 1 DM, HL, HTN who presented on 12/3 with confusion and weakness.  Family noted decreased oral intake and diaphoresis and chills.  She does make urine and had WBC's and bacteria, so was started on Ceftriaxone.  CT head was negative for acute findings.   Starting 12/4 pt's BP started down-trending, lactic acid 2.4.  She was started on NS 75cc/hr and overnight BP as low as 56/42.  She was given a total of 2L IVF boluses and transferred to the ICU.   On exam, pt is awake and alert, states we are in the wrong room and denies complaints.  Oriented only to person.   Past Medical History   has a past medical history of DEPRESSION, DIABETES MELLITUS, TYPE I, ESRD (end stage renal disease) on dialysis ([place]), GERD, GLAUCOMA, Headache(784.0), Hepatitis B carrier ([place]), HYPERLIPIDEMIA, HYPERTENSION, Memory deficit, OSTEOPENIA, POSTMENOPAUSAL STATUS, Pulmonary edema, and Unspecified vitamin D deficiency.   Significant Hospital Events   12/3 Admit to Hospitalists 12/5 - Pt denying lung ultrasound  Consults:  PCCM Nephrology  Procedures:    Significant Diagnostic Tests:  12/5 CXR>> Echo>>  Micro Data:  UC>>never sent 12/3 BCx2>> 12/4 Sars-CoV-2>>negative  Antimicrobials:  Ceftriaxone 12/3-125 Cefepime 12/5- Vancomycin 12/5-  Interim history/subjective:    10/05/2019 - repeat rounds. Not on vent. Awake and alert. And ORiented. Diastolic low - SBP > 123XX123 with MAP > 50/55. Refuses IV, CVL, blood draws.Says goal is to go home well and asap. Says blood test last week is sufficient. Seems to have capacity. On 45mcg levophd via one PIV.  Per RN s./p 1L HD volume removal yesterday  Objective   Blood pressure (!) 64/43, pulse 81, temperature 98.1 F (36.7 C), temperature source  Oral, resp. rate (!) 22, height 5' 5.5" (1.664 m), weight 59.9 kg, SpO2 98 %.        Intake/Output Summary (Last 24 hours) at 10/05/2019 0950 Last data filed at 10/05/2019 0233 Gross per 24 hour  Intake 642 ml  Output 980 ml  Net -338 ml   Filed Weights   10/04/19 1428 10/04/19 1820 10/05/19 0500  Weight: 57.1 kg 56.9 kg 59.9 kg     General Appearance:  Looks chronically unwell. Alert. FRAIL. ? Looks dry Head:  Normocephalic, without obvious abnormality, atraumatic Eyes:  PERRL - yes, conjunctiva/corneas - muddy     Ears:  Normal external ear canals, both ears Nose:  G tube - no Throat:  ETT TUBE - no , OG tube - no Neck:  Supple,  No enlargement/tenderness/nodules Lungs: Clear to auscultation bilaterally, Heart:  S1 and S2 normal, no murmur, CVP - x.  Pressors - levophed 55mcg Abdomen:  Soft, no masses, no organomegaly Genitalia / Rectal:  Not done Extremities:  Extremities- intact Skin:  ntact in exposed areas . Sacral area - x Neurologic:  Sedation - none -> RASS - +1 . Moves all 4s - yes. CAM-ICU - seems negative . Orientation - seems oriented      Resolved Hospital Problem list     Assessment & Plan:   Hypotension, possible sepsis secondary to UTI vs PNA - -initial CXR with R pleural effusion vs. Infiltrate  10/05/2019 - still on levophed 90mcg. Looks dry. Echo result pending (Bedsid  ? Biventricular dysfn). Has  low diastolic  P: -[id number] LR bolus  - levophed to continue - empiric hydrocort x 1 and then based on response  - add midodrine oral  - SBP goal > 100 with MAP goal > 50   Concern for sepsis   10/05/2019 - afebrile  plan Cefepime/Vanc -follow BC, send UC -check procalcitonin, Lactic acid, TSH and cortisol - refuseing -await urine strep and legionella -Levophed to maintain MAP of 65   ESRD -MWF dialysis -per renal   Type 1 DM  10/05/2019 - sugar 70  Plan  - d5W at 20cc/hr -continue long aciting insulin and SS  coverage   Dementia -increasingly combative after transfer, prn Haldol 1mg  -d/c Ambien   Chronic Thrombocytopenia - improving -does not appear that pt has had outpatient work-up yet  Anemia of renal and critical illness  10/05/2019 - no bleeding  - PRBC for hgb </= 6.9gm%    - exceptions are   -  if ACS susepcted/confirmed then transfuse for hgb </= 8.0gm%,  or    -  active bleeding with hemodynamic instability, then transfuse regardless of hemoglobin value   At at all times try to transfuse 1 unit prbc as possible with exception of active hemorrhage     Best practice:  Diet: diabetic Pain/Anxiety/Delirium protocol (if indicated): Haldol VAP protocol (if indicated): n/a DVT prophylaxis: SCD's GI prophylaxis: n/a Glucose control: SSI Mobility: bed rest Code Status: full Family Communication: patient at bedside. Says wants to get well but refusing 2nd PIV, blood draws and central line Disposition: ICU     ATTESTATION & SIGNATURE   The patient [name] [name] is critically ill with multiple organ systems failure and requires high complexity decision making for assessment and support, frequent evaluation and titration of therapies, application of advanced monitoring technologies and extensive interpretation of multiple databases.   Critical Care Time devoted to patient care services described in this note is  30  Minutes. This time reflects time of care of this signee Dr Brand Males. This critical care time does not reflect procedure time, or teaching time or supervisory time of PA/NP/Med student/Med Resident etc but could involve care discussion time     Dr. Brand Males, M.D., [facility].C.P Pulmonary and Critical Care Medicine Staff Physician [place] [facility] Pager: [phone], If no answer or between  15:00h - 7:00h: call [phone]  10/05/2019 9:54 AM   LABS    PULMONARY No results for input(s): PHART,  PCO2ART, PO2ART, HCO3, TCO2, O2SAT in the last 168 hours.  Invalid input(s): PCO2, PO2  CBC Recent Labs  Lab 10/04/19 0207 10/04/19 1315 10/05/19 0014  HGB 8.5* 10.2* 9.5*  HCT 26.8* 33.1* 30.5*  WBC 2.8* 6.2 6.3  PLT 56* 65* 75*    COAGULATION Recent Labs  Lab 10/03/19 2054  INR 1.3*    CARDIAC  No results for input(s): TROPONINI in the last 168 hours. No results for input(s): PROBNP in the last 168 hours.   CHEMISTRY Recent Labs  Lab 10/03/19 1600 10/04/19 0035 10/04/19 0207 10/04/19 1315  NA 139  --  141 142  K 4.4  --  3.7 4.4  CL 98  --  102 102  CO2 32  --  27 21*  GLUCOSE 113*  --  128* 48*  BUN 12  --  14 21  CREATININE 3.51* 3.82* 3.79* 4.17*  CALCIUM 7.8*  --  7.6* 8.4*  PHOS  --   --  3.6 4.9*   Estimated Creatinine Clearance: 10.4 mL/min (A) (by C-G formula based on SCr of 4.17 mg/dL (H)).   LIVER Recent Labs  Lab 10/03/19 1600 10/03/19 2054 10/04/19 0207 10/04/19 1315  AST 55*  --   --   --   ALT 26  --   --   --   ALKPHOS 141*  --   --   --   BILITOT 0.9  --   --   --   PROT 6.9  --   --   --   ALBUMIN 2.6*  --  2.3* 2.8*  INR  --  1.3*  --   --      INFECTIOUS Recent Labs  Lab 10/03/19 2054 10/04/19 0035  LATICACIDVEN 1.8 2.3*     ENDOCRINE CBG (last 3)  Recent Labs    10/05/19 0354 10/05/19 0458 10/05/19 0752  GLUCAP 98 93 72         IMAGING x48h  - image(s) personally visualized  -   highlighted in bold Ct Head Wo Contrast  Result Date: 10/03/2019 CLINICAL DATA:  Altered level of consciousness (LOC), unexplained EXAM: CT HEAD WITHOUT CONTRAST TECHNIQUE: Contiguous axial images were obtained from the base of the skull through the vertex without intravenous contrast. COMPARISON:  Head CT 10/16/2018 FINDINGS: Brain: No evidence of acute infarction, hemorrhage, extra-axial collection or mass lesion/mass effect. Stable degree of atrophy. Ventricular prominence is unchanged, likely due to central atrophy. There is  mild chronic small vessel ischemia. Scattered streak artifact from hair clips. Vascular: Atherosclerosis of skullbase vasculature without hyperdense vessel or abnormal calcification. Skull: No fracture or focal lesion. Sinuses/Orbits: Paranasal sinuses and mastoid air cells are clear. The visualized orbits are unremarkable. Bilateral cataract resection. Other: None. IMPRESSION: 1. No acute intracranial abnormality. 2. Stable atrophy and chronic small vessel ischemia since 2019. Electronically Signed   By: Keith Rake M.D.   On: 10/03/2019 18:42   Dg Chest Port 1 View  Result Date: 10/05/2019 CLINICAL DATA:  Reason for exam: R/O sepsis in inpatient. Hx of diabetes, HTN, pulmonary edema. EXAM: PORTABLE CHEST 1 VIEW COMPARISON:  10/03/2019 FINDINGS: Stable enlarged cardiac silhouette. Moderate volume RIGHT pleural effusion unchanged. No pulmonary edema. No pneumothorax. Vascular stent in the LEFT axilla. IMPRESSION: No significant change in moderate RIGHT pleural effusion with cardiomegaly. Electronically Signed   By: Suzy Bouchard M.D.   On: 10/05/2019 07:21   Dg Chest Portable 1 View  Result Date: 10/03/2019 CLINICAL DATA:  Altered mental status EXAM: PORTABLE CHEST 1 VIEW COMPARISON:  December 13, 2018 FINDINGS: Small to moderate right pleural effusion and adjacent atelectasis/consolidation. Stable cardiomediastinal contours. No pneumothorax. IMPRESSION: Small to moderate right pleural effusion with adjacent atelectasis/consolidation. Electronically Signed   By: Macy Mis M.D.   On: 10/03/2019 16:34

## 2019-10-05 NOTE — Progress Notes (Signed)
Pt BP 56/32.  Rechecked and was 72/60.  Attempting manual BP but notified MD with TRH.

## 2019-10-05 NOTE — Progress Notes (Signed)
Patient refusing echo

## 2019-10-05 NOTE — Progress Notes (Signed)
Patient transferred to 2M05.

## 2019-10-05 NOTE — Progress Notes (Signed)
Pharmacy Antibiotic Note  [name] [name] is a 78 y.o. female with ESRD on HD admitted on 10/03/2019 with AMS/weakness and hypotension, possible sepsis.  Pharmacy has been consulted for Vancomycin and Cefepime dosing.  Plan: Vancomycin 1250 mg IV now, then Vancomycin 500 mg IV after each HD Cefepime 1 g IV q24h  Height: 5' 5.5" (166.4 cm) Weight: 132 lb 0.9 oz (59.9 kg) IBW/kg (Calculated) : 58.15  Temp (24hrs), Avg:97.9 F (36.6 C), Min:97.5 F (36.4 C), Max:98.1 F (36.7 C)  Recent Labs  Lab 10/03/19 1600 10/03/19 2054 10/04/19 0035 10/04/19 0207 10/04/19 1315 10/05/19 0014  WBC 2.6*  --  3.1* 2.8* 6.2 6.3  CREATININE 3.51*  --  3.82* 3.79* 4.17*  --   LATICACIDVEN  --  1.8 2.3*  --   --   --     Estimated Creatinine Clearance: 10.4 mL/min (A) (by C-G formula based on SCr of 4.17 mg/dL (H)).    Allergies  Allergen Reactions  . Hydralazine Itching  . Robaxin [Methocarbamol] Other (See Comments)    Dizziness     Donnajean Lopes Bronson Curb 10/05/2019 7:13 AM

## 2019-10-05 NOTE — Progress Notes (Signed)
CCM unaware patient is in ICU. TRH overnight coverage paged and notified to contact CCM about patient due to her hypotension.

## 2019-10-05 NOTE — Progress Notes (Addendum)
Verbal order from MD to transfer patient to ICU.  Rapid RN notified.

## 2019-10-05 NOTE — Plan of Care (Signed)
78 yo F with a history of dementia, ESRD, diabetes, hypertension who was admitted to the hospitalist service 12/4 for worsening confusion, and worsening hypotension with dialysis over the last 3 weeks, transferred to the ICU for persistent hypotension after HD this morning.  Also had a hypoglycemic event earlier in the evening.  Got 1L out with HD today and got 2L this evening.   O: On exam, patient's blood pressure is variable (from 80s/30s to 90s/30s), and patient is arousable to voice.  Not oriented.  Diminished breath sounds on right side, abdomen soft and nontender.  On room air.  1+ pitting edema in bilateral extremities  Bedside POCUS echo exam showed biventricular heart failure with dilation of RV and LV and reduced EF.  A/P: # Hypotension: could be due to congestive heart failure with bedside echo that appears worse then echo in Jan 2019.  Sepsis is also in the ddx (possible source being pneumonia or UTI) particularly with her pancytopenia on presentation and her hypoglycemia - EKG and troponins - no more fluids - starting levophed low dose peripherally - broaden abx from ceftriaxone to vanc/cefepime - checking cortisol and TSH.  - holding off on stress dose steroids at this time, can consider it if patient fails to improve  - formal echo  # ESRD:  Currently does not have an acute need for HD - will likely need vascath for CRRT in the future.   # Hypoglycemia:  Will monitor closely, could be due to sepsis in the setting of ESRD.  - hypoglycemia protocol - checking cortisol

## 2019-10-05 NOTE — Progress Notes (Signed)
[name] [name]  - Progress Note  [name] [name] [id number] DOB: May 23, 1941 DOA: 10/03/2019 PCP: Ann Held, DO   Brief narrative: HPI: [name] [name] is a 78 y.o. female with medical history significant of end-stage renal disease on hemodialysis Mondays Wednesdays and Fridays, diabetes, depression, GERD, noncompliance, hypertension who apparently went to her hemodialysis yesterday.  In the last week or 2 she has been having weakness.  She has had some confusion with hypotension occasionally after dialysis but in the last 3 weeks since she had a fall she has been having more confusion and more weakness.  Her oral intake has also decreased.  Family brought her in because she has not been herself.  She has pain in the left and left leg and in general nonspecific complaint.  They also noticed some diaphoresis as well as patient reporting some chills.  She has alternated between being depressed to being very manic and thus not her usual self so family were worried.  Daughter is at bedside giving history.  Patient was found to have evidence of UTI and a lot of electrolyte abnormalities.  She reported that her nephrologist has noted the electrolyte abnormalities previously and recommended that she comes to the hospital for admission but she declined.  Today however she was brought in by family who insisted that she comes in and gets checked.  ED Course: Temperature is 9078 blood pressure 77/49 initially.  Pulse 82 respiratory 26 oxygen sat 94% room air.  White count is 2.6 hemoglobin 9.1 and platelet count of 62.  Sodium 139 potassium 4.4 chloride 98 CO2 32 glucose 113 BUN 12 creatinine 3.51 calcium 7.8 albumin 2.6.  Troponin is 651 and then 675 lactic acid 1.8.  PT 15.7 INR 1.2 urinalysis showed cloudy urine.  Leukocytes.  Many bacteria WBC 11-20.  Chest x-ray showed no acute findings.  Urine cultures obtained and patient has been admitted for treatment  Subjective: Her BP was low last night  after HD and she was transferred to the ICU, started on Levophed which has been continued today with improvement of her BP. She is alert today, answering questions and following commands which is a great improvement of her mentation. She denies pain.   Assessment/Plan: Principal Problem:   AMS (altered mental status) Active Problems:   Type II diabetes mellitus with nephropathy (HCC)   Hyperlipidemia LDL goal <70   Essential hypertension   GERD   ESRD (end stage renal disease) (HCC)   Sepsis ([place])   Acute lower UTI   Acute metabolic encephalopathy:  -Suspected secondary to acute infectious etiology (see below) -Had agitation last night, will continue Haldol as needed.  -Telemetry monitoring while receiving Haldol-follow QTC.   -Advance her diet now that her mentation has improvement.   Sepsis with suspected urinary tract infection:  -Presented with the following sepsis physiology: Hypotension, acute delirium/altered mentation different from baseline, abnormal urinalysis, underlying lactic acidosis less than 4.0 relative hypoglycemia -Follow-up on blood cultures-obtain urine culture -Given Maxipime in the ER- then changed to IV Rocephin but Abx broadened on 12/4 evening due to persistent hypotension. Now on vancomycin and Cefepime for UTI and presumed pna, poa.  -Continue Levophed, midodrine, steroids, as recommended per PCCM.  -Continue ICU care.   Chronic kidney disease stage V requiring hemodialysis  -Dialyzes MWF -Dr. Justin Mend with nephrology consulted -Primary nephrologist is Dr. Hollie Salk  Diabetes mellitus 2 diet controlled at home: -Glucose has been low while patient could not have oral intake due  to AMS. Got as low as 30.  -Resume diet now that her mentation has improved.  -Continue to follow CBG and provide SSI  Essential hypertension:  -Patient presented with hypotension which persisted despite IV fluids and worsened after HD on 12/4.  -Continue pressor, wean off as  tolerated.  -2D echo ordered but patient has refused.  -Hold home antihypertensives.   GERD:  -Continue PPIs  Hyperlipidemia:  -Continue with statin  Chronic pancytopenia.  Documented as present since last year but unclear if this has been followed up in the outpatient setting.  -Platelets have improved today. Continue monitoring closely.  -Will need outpatient Hematology follow up.   DVT prophylaxis: SCDs  Code Status:  Full  Family Communication:  Family was updated earlier by ICU provider.   Disposition Plan/Expected LOS: Continue ICU care while requiring pressors for BP support  Consultants: Nephrology PCCM  Procedures: None               Cultures: Blood cultures pending Urine culture  Antibiotics: 12/3 IV Maxipime x1 dose in the ER 12/4 IV Rocephin >>12/4 Cefepime and Vancomycin 12/5>>  Objective: Blood pressure (!) 64/43, pulse 81, temperature 97.6 F (36.4 C), temperature source Axillary, resp. rate (!) 22, height 5' 5.5" (1.664 m), weight 59.9 kg, SpO2 98 %.  Intake/Output Summary (Last 24 hours) at 10/05/2019 1405 Last data filed at 10/05/2019 1400 Gross per 24 hour  Intake 1600.27 ml  Output 980 ml  Net 620.27 ml     Exam: Gen: No acute respiratory distress-sitting up in bed Psych: Appropriate mood and affect.   Chest: No respiratory distress, no wheezing or rhonchi.  Cardiac: Regular rate and rhythm, 3/5 systolic murmur right sternal border, no peripheral edema. No JVD Abdomen: Soft nontender nondistended Extremities: Symmetrical in appearance without cyanosis, clubbing or effusion Neuro: Alert, oriented to person only. Following commands, no hemiparesis, able to move ext x4 voluntarily   Scheduled Meds:  Scheduled Meds: . Chlorhexidine Gluconate Cloth  6 each Topical Q0600  . cinacalcet  30 mg Oral Q supper  . darbepoetin (ARANESP) injection - DIALYSIS  100 mcg Intravenous Q Fri-HD  . dextrose      . doxercalciferol  1 mcg  Intravenous Q M,W,F-HD  . midodrine  10 mg Oral TID WC  . multivitamin  1 tablet Oral QHS  . [START ON 10/07/2019] vancomycin  500 mg Intravenous Q M,W,F-HD   Continuous Infusions: . sodium chloride 100 mL (10/04/19 1250)  . sodium chloride    . sodium chloride Stopped (10/05/19 0224)  . ceFEPime (MAXIPIME) IV Stopped (10/05/19 1246)  . norepinephrine (LEVOPHED) Adult infusion Stopped (10/05/19 1206)  . vancomycin 1,250 mg (10/05/19 1354)    Data Reviewed: Basic Metabolic Panel: Recent Labs  Lab 10/03/19 1600 10/04/19 0035 10/04/19 0207 10/04/19 1315 10/05/19 1238  NA 139  --  141 142 137  K 4.4  --  3.7 4.4 4.6  CL 98  --  102 102 98  CO2 32  --  27 21* 21*  GLUCOSE 113*  --  128* 48* 87  BUN 12  --  14 21 16   CREATININE 3.51* 3.82* 3.79* 4.17* 2.83*  CALCIUM 7.8*  --  7.6* 8.4* 8.5*  MG  --   --   --   --  1.8  PHOS  --   --  3.6 4.9* 4.5   Liver Function Tests: Recent Labs  Lab 10/03/19 1600 10/04/19 0207 10/04/19 1315 10/05/19 1238  AST 55*  --   --  80*  ALT 26  --   --  38  ALKPHOS 141*  --   --  152*  BILITOT 0.9  --   --  1.5*  PROT 6.9  --   --  7.0  ALBUMIN 2.6* 2.3* 2.8* 2.8*   No results for input(s): LIPASE, AMYLASE in the last 168 hours. No results for input(s): AMMONIA in the last 168 hours. CBC: Recent Labs  Lab 10/03/19 1600 10/04/19 0035 10/04/19 0207 10/04/19 1315 10/05/19 0014 10/05/19 1053  WBC 2.6* 3.1* 2.8* 6.2 6.3 6.4  NEUTROABS 1.9  --   --   --   --   --   HGB 9.1* 7.9* 8.5* 10.2* 9.5* 10.2*  HCT 29.0* 25.2* 26.8* 33.1* 30.5* 31.5*  MCV 105.1* 104.6* 104.7* 106.4* 103.0* 102.9*  PLT 62* 63* 56* 65* 75* 84*   Cardiac Enzymes: No results for input(s): CKTOTAL, CKMB, CKMBINDEX, TROPONINI in the last 168 hours. BNP (last 3 results) No results for input(s): BNP in the last 8760 hours.  ProBNP (last 3 results) No results for input(s): PROBNP in the last 8760 hours.  CBG: Recent Labs  Lab 10/05/19 0320 10/05/19 0354  10/05/19 0458 10/05/19 0752 10/05/19 1142  GLUCAP 62* 98 93 72 76    Recent Results (from the past 240 hour(s))  Culture, blood (routine x 2)     Status: None (Preliminary result)   Collection Time: 10/03/19  8:54 PM   Specimen: BLOOD  Result Value Ref Range Status   Specimen Description BLOOD RIGHT ARM  Final   Special Requests   Final    BOTTLES DRAWN AEROBIC AND ANAEROBIC Blood Culture adequate volume   Culture   Final    NO GROWTH 2 DAYS Performed at [facility], [address], [place], [place] [address]    Report Status PENDING  Incomplete  SARS CORONAVIRUS 2 (TAT 6-24 HRS) Nasopharyngeal Nasopharyngeal Swab     Status: None   Collection Time: 10/04/19 12:40 AM   Specimen: Nasopharyngeal Swab  Result Value Ref Range Status   SARS Coronavirus 2 NEGATIVE NEGATIVE Final    Comment: (NOTE) SARS-CoV-2 target nucleic acids are NOT DETECTED. The SARS-CoV-2 RNA is generally detectable in upper and lower respiratory specimens during the acute phase of infection. Negative results do not preclude SARS-CoV-2 infection, do not rule out co-infections with other pathogens, and should not be used as the sole basis for treatment or other patient management decisions. Negative results must be combined with clinical observations, patient history, and epidemiological information. The expected result is Negative. Fact Sheet for Patients: SugarRoll.be Fact Sheet for Healthcare Providers: https://www.woods-mathews.com/ This test is not yet approved or cleared by the [place] FDA and  has been authorized for detection and/or diagnosis of SARS-CoV-2 by FDA under an Emergency Use Authorization (EUA). This EUA will remain  in effect (meaning this test can be used) for the duration of the COVID-19 declaration under Section 56 4(b)(1) of the Act, 21 U.S.C. section 360bbb-3(b)(1), unless the authorization is terminated or revoked  sooner. Performed at [facility], [place] [address] [place],  [address]   MRSA PCR Screening     Status: None   Collection Time: 10/05/19  3:31 AM   Specimen: Nasopharyngeal  Result Value Ref Range Status   MRSA by PCR NEGATIVE NEGATIVE Final    Comment:        The GeneXpert MRSA Assay (FDA approved for NASAL specimens only), is one component of a comprehensive  MRSA colonization surveillance program. It is not intended to diagnose MRSA infection nor to guide or monitor treatment for MRSA infections. Performed at [facility], [place] [address] [place], [place] [address]         Time spent : 25 minutes   Blain Pais, MD [facility]             **If unable to reach the above provider after paging please contact the Flow Manager @ [phone]   If 7PM-7AM, please contact night-coverage  10/05/2019, 2:05 PM   LOS: 2 days

## 2019-10-05 NOTE — Progress Notes (Signed)
[name] ROUNDING NOTE   Subjective:   78 year old lady with history of end-stage renal disease Monday Wednesday Friday dialysis secondary to diabetes.  She also has depression gastroesophageal reflux disease noncompliance and hypertension.  She was brought in by family due to altered mental status and complaints of pain left leg which is nonspecific.       She underwent dialysis 10/04/2019 with removal of 930 cc  Blood pressure 118/19 pulse 40 temperature 97.9 O2 sats 98% room air  Sodium 142 potassium 4.4 chloride 102 CO2 22 BUN 21 creatinine 4.17 glucose 48 calcium 8.4 phosphorus 4.9 albumin 2.8 WBC 6.2 hemoglobin 10.2 platelets 65  IV norepinephrine IV Maxipime IV vancomycin  Sensipar 30 mg daily Aranesp 100 mcg administered 10/04/2019, multivitamins 1 daily   Objective:  Vital signs in last 24 hours:  Temp:  [97.5 F (36.4 C)-98.1 F (36.7 C)] 97.9 F (36.6 C) (12/05 0742) Pulse Rate:  [32-125] 32 (12/05 0700) Resp:  [14-26] 15 (12/05 0800) BP: (56-133)/(19-89) 118/19 (12/05 0800) SpO2:  [86 %-100 %] 86 % (12/05 0700) Weight:  [56.1 kg-59.9 kg] 59.9 kg (12/05 0500)  Weight change: -2.903 kg Filed Weights   10/04/19 1428 10/04/19 1820 10/05/19 0500  Weight: 57.1 kg 56.9 kg 59.9 kg    Intake/Output: I/O last 3 completed shifts: In: [id number] [P.O.:642] Out: 980 [Urine:50; Other:930]   Intake/Output this shift:  No intake/output data recorded. General: Chronically ill-appearing lady no obvious distress Neck:  Supple; no masses or thyromegaly. JVP not elevated Lungs:  Clear throughout to auscultation.   No wheezes, crackles, or rhonchi. No acute distress. Heart:  Regular rate and rhythm; no murmurs, clicks, rubs,  or gallops. Abdomen:  Soft, nontender and nondistended. No masses, hepatosplenomegaly or hernias noted. Normal bowel sounds, without guarding, and without rebound.   Extremities   left AV graft no edema   Basic Metabolic Panel: Recent Labs   Lab 10/03/19 1600 10/04/19 0035 10/04/19 0207 10/04/19 1315  NA 139  --  141 142  K 4.4  --  3.7 4.4  CL 98  --  102 102  CO2 32  --  27 21*  GLUCOSE 113*  --  128* 48*  BUN 12  --  14 21  CREATININE 3.51* 3.82* 3.79* 4.17*  CALCIUM 7.8*  --  7.6* 8.4*  PHOS  --   --  3.6 4.9*    Liver Function Tests: Recent Labs  Lab 10/03/19 1600 10/04/19 0207 10/04/19 1315  AST 55*  --   --   ALT 26  --   --   ALKPHOS 141*  --   --   BILITOT 0.9  --   --   PROT 6.9  --   --   ALBUMIN 2.6* 2.3* 2.8*   No results for input(s): LIPASE, AMYLASE in the last 168 hours. No results for input(s): AMMONIA in the last 168 hours.  CBC: Recent Labs  Lab 10/03/19 1600 10/04/19 0035 10/04/19 0207 10/04/19 1315 10/05/19 0014  WBC 2.6* 3.1* 2.8* 6.2 6.3  NEUTROABS 1.9  --   --   --   --   HGB 9.1* 7.9* 8.5* 10.2* 9.5*  HCT 29.0* 25.2* 26.8* 33.1* 30.5*  MCV 105.1* 104.6* 104.7* 106.4* 103.0*  PLT 62* 63* 56* 65* 75*    Cardiac Enzymes: No results for input(s): CKTOTAL, CKMB, CKMBINDEX, TROPONINI in the last 168 hours.  BNP: Invalid input(s): POCBNP  CBG: Recent Labs  Lab 10/05/19 0029 10/05/19 0320 10/05/19 0354 10/05/19  0458 10/05/19 0752  GLUCAP 86 62* 98 93 72    Microbiology: Results for orders placed or performed during the hospital encounter of 10/03/19  Culture, blood (routine x 2)     Status: None (Preliminary result)   Collection Time: 10/03/19  8:54 PM   Specimen: BLOOD  Result Value Ref Range Status   Specimen Description BLOOD RIGHT ARM  Final   Special Requests   Final    BOTTLES DRAWN AEROBIC AND ANAEROBIC Blood Culture adequate volume   Culture   Final    NO GROWTH < 12 HOURS Performed at [facility], [place] [address] [place], [place] [address]    Report Status PENDING  Incomplete  SARS CORONAVIRUS 2 (TAT 6-24 HRS) Nasopharyngeal Nasopharyngeal Swab     Status: None   Collection Time: 10/04/19 12:40 AM   Specimen: Nasopharyngeal Swab  Result  Value Ref Range Status   SARS Coronavirus 2 NEGATIVE NEGATIVE Final    Comment: (NOTE) SARS-CoV-2 target nucleic acids are NOT DETECTED. The SARS-CoV-2 RNA is generally detectable in upper and lower respiratory specimens during the acute phase of infection. Negative results do not preclude SARS-CoV-2 infection, do not rule out co-infections with other pathogens, and should not be used as the sole basis for treatment or other patient management decisions. Negative results must be combined with clinical observations, patient history, and epidemiological information. The expected result is Negative. Fact Sheet for Patients: SugarRoll.be Fact Sheet for Healthcare Providers: https://www.woods-mathews.com/ This test is not yet approved or cleared by the [place] FDA and  has been authorized for detection and/or diagnosis of SARS-CoV-2 by FDA under an Emergency Use Authorization (EUA). This EUA will remain  in effect (meaning this test can be used) for the duration of the COVID-19 declaration under Section 56 4(b)(1) of the Act, 21 U.S.C. section 360bbb-3(b)(1), unless the authorization is terminated or revoked sooner. Performed at [facility], [place] [address] [place], [place] [address]   MRSA PCR Screening     Status: None   Collection Time: 10/05/19  3:31 AM   Specimen: Nasopharyngeal  Result Value Ref Range Status   MRSA by PCR NEGATIVE NEGATIVE Final    Comment:        The GeneXpert MRSA Assay (FDA approved for NASAL specimens only), is one component of a comprehensive MRSA colonization surveillance program. It is not intended to diagnose MRSA infection nor to guide or monitor treatment for MRSA infections. Performed at [facility], [place] [address], [name], [name] [address]     Coagulation Studies: Recent Labs    10/03/19 01/24/53  LABPROT 15.7*  INR 1.3*    Urinalysis: Recent Labs    10/03/19 2013-01-24   COLORURINE YELLOW  LABSPEC 1.025  PHURINE 5.5  GLUCOSEU NEGATIVE  HGBUR MODERATE*  BILIRUBINUR MODERATE*  KETONESUR 15*  PROTEINUR >300*  NITRITE NEGATIVE  LEUKOCYTESUR MODERATE*      Imaging: Ct Head Wo Contrast  Result Date: 10/03/2019 CLINICAL DATA:  Altered level of consciousness (LOC), unexplained EXAM: CT HEAD WITHOUT CONTRAST TECHNIQUE: Contiguous axial images were obtained from the base of the skull through the vertex without intravenous contrast. COMPARISON:  Head CT 2020/10/2517 FINDINGS: Brain: No evidence of acute infarction, hemorrhage, extra-axial collection or mass lesion/mass effect. Stable degree of atrophy. Ventricular prominence is unchanged, likely due to central atrophy. There is mild chronic small vessel ischemia. Scattered streak artifact from hair clips. Vascular: Atherosclerosis of skullbase vasculature without hyperdense vessel or abnormal calcification. Skull:  No fracture or focal lesion. Sinuses/Orbits: Paranasal sinuses and mastoid air cells are clear. The visualized orbits are unremarkable. Bilateral cataract resection. Other: None. IMPRESSION: 1. No acute intracranial abnormality. 2. Stable atrophy and chronic small vessel ischemia since 2019. Electronically Signed   By: Keith Rake M.D.   On: 10/03/2019 18:42   Dg Chest Port 1 View  Result Date: 10/05/2019 CLINICAL DATA:  Reason for exam: R/O sepsis in inpatient. Hx of diabetes, HTN, pulmonary edema. EXAM: PORTABLE CHEST 1 VIEW COMPARISON:  10/03/2019 FINDINGS: Stable enlarged cardiac silhouette. Moderate volume RIGHT pleural effusion unchanged. No pulmonary edema. No pneumothorax. Vascular stent in the LEFT axilla. IMPRESSION: No significant change in moderate RIGHT pleural effusion with cardiomegaly. Electronically Signed   By: Suzy Bouchard M.D.   On: 10/05/2019 07:21   Dg Chest Portable 1 View  Result Date: 10/03/2019 CLINICAL DATA:  Altered mental status EXAM: PORTABLE CHEST 1 VIEW COMPARISON:   December 13, 2018 FINDINGS: Small to moderate right pleural effusion and adjacent atelectasis/consolidation. Stable cardiomediastinal contours. No pneumothorax. IMPRESSION: Small to moderate right pleural effusion with adjacent atelectasis/consolidation. Electronically Signed   By: Macy Mis M.D.   On: 10/03/2019 16:34     Medications:   . sodium chloride 100 mL (10/04/19 1250)  . sodium chloride    . sodium chloride 250 mL (10/05/19 0015)  . ceFEPime (MAXIPIME) IV    . norepinephrine (LEVOPHED) Adult infusion 2 mcg/min (10/05/19 [id number])  . vancomycin     . Chlorhexidine Gluconate Cloth  6 each Topical Q0600  . cinacalcet  30 mg Oral Q supper  . darbepoetin (ARANESP) injection - DIALYSIS  100 mcg Intravenous Q Fri-HD  . dextrose      . doxercalciferol  1 mcg Intravenous Q M,W,F-HD  . multivitamin  1 tablet Oral QHS  . [START ON 10/07/2019] vancomycin  500 mg Intravenous Q M,W,F-HD   sodium chloride, sodium chloride, sodium chloride, acetaminophen **OR** acetaminophen, alteplase, calcium carbonate (dosed in mg elemental calcium), camphor-menthol **AND** hydrOXYzine, docusate sodium, feeding supplement (NEPRO CARB STEADY), haloperidol lactate, heparin, lidocaine (PF), lidocaine-prilocaine, ondansetron **OR** ondansetron (ZOFRAN) IV, pentafluoroprop-tetrafluoroeth, sorbitol  Assessment/ Plan:   ESRD-Monday Wednesday Friday dialysis estimated fluid gains 0 to 1 L estimated dry weight 52.5.  She has an AV graft left upper extremity.  She underwent successful dialysis 10/04/2019.  No indications for CRRT at this point  ANEMIA-appears to have some pancytopenia I am curious if she has been evaluated by hematology recently.  She uses Micera  MBD-calcium and phosphorus appears to be well controlled and continues doxercalciferol 1 mcg q. treatment  HTN/VOL-appears to be controlled with very minimal fluid gains  ACCESS-AV graft  Hypertension.  Broad-spectrum antibiotics started and Levophed  initiated.  Checking 2D echo cortisol and TSH.   LOS: 2 Sherril Croon @TODAY @9 :02 AM

## 2019-10-06 ENCOUNTER — Inpatient Hospital Stay (HOSPITAL_COMMUNITY): Payer: Medicare HMO

## 2019-10-06 DIAGNOSIS — R570 Cardiogenic shock: Secondary | ICD-10-CM

## 2019-10-06 DIAGNOSIS — E872 Acidosis: Secondary | ICD-10-CM | POA: Diagnosis not present

## 2019-10-06 DIAGNOSIS — I361 Nonrheumatic tricuspid (valve) insufficiency: Secondary | ICD-10-CM

## 2019-10-06 DIAGNOSIS — I340 Nonrheumatic mitral (valve) insufficiency: Secondary | ICD-10-CM | POA: Diagnosis not present

## 2019-10-06 LAB — RENAL FUNCTION PANEL
Albumin: 2.5 g/dL — ABNORMAL LOW (ref 3.5–5.0)
Anion gap: 17 — ABNORMAL HIGH (ref 5–15)
BUN: 32 mg/dL — ABNORMAL HIGH (ref 8–23)
CO2: 20 mmol/L — ABNORMAL LOW (ref 22–32)
Calcium: 8.2 mg/dL — ABNORMAL LOW (ref 8.9–10.3)
Chloride: 97 mmol/L — ABNORMAL LOW (ref 98–111)
Creatinine, Ser: 3.5 mg/dL — ABNORMAL HIGH (ref 0.44–1.00)
GFR calc Af Amer: 14 mL/min — ABNORMAL LOW (ref >60)
GFR calc non Af Amer: 12 mL/min — ABNORMAL LOW (ref >60)
Glucose, Bld: 141 mg/dL — ABNORMAL HIGH (ref 70–99)
Phosphorus: 5.4 mg/dL — ABNORMAL HIGH (ref 2.5–4.6)
Potassium: 5 mmol/L (ref 3.5–5.1)
Sodium: 134 mmol/L — ABNORMAL LOW (ref 135–145)

## 2019-10-06 LAB — CBC
HCT: 35.6 % — ABNORMAL LOW (ref 36.0–46.0)
Hemoglobin: 11.3 g/dL — ABNORMAL LOW (ref 12.0–15.0)
MCH: 32.6 pg (ref 26.0–34.0)
MCHC: 31.7 g/dL (ref 30.0–36.0)
MCV: 102.6 fL — ABNORMAL HIGH (ref 80.0–100.0)
Platelets: 98 10*3/uL — ABNORMAL LOW (ref 150–400)
RBC: 3.47 MIL/uL — ABNORMAL LOW (ref 3.87–5.11)
RDW: 17.5 % — ABNORMAL HIGH (ref 11.5–15.5)
WBC: 8.6 10*3/uL (ref 4.0–10.5)
nRBC: 2.5 % — ABNORMAL HIGH (ref 0.0–0.2)

## 2019-10-06 LAB — LACTIC ACID, PLASMA: Lactic Acid, Venous: 3.8 mmol/L (ref 0.5–1.9)

## 2019-10-06 LAB — TROPONIN I (HIGH SENSITIVITY): Troponin I (High Sensitivity): 643 ng/L (ref <18)

## 2019-10-06 LAB — GLUCOSE, CAPILLARY
Glucose-Capillary: 129 mg/dL — ABNORMAL HIGH (ref 70–99)
Glucose-Capillary: 107 mg/dL — ABNORMAL HIGH (ref 70–99)
Glucose-Capillary: 138 mg/dL — ABNORMAL HIGH (ref 70–99)
Glucose-Capillary: 161 mg/dL — ABNORMAL HIGH (ref 70–99)
Glucose-Capillary: 131 mg/dL — ABNORMAL HIGH (ref 70–99)

## 2019-10-06 LAB — ECHOCARDIOGRAM COMPLETE
Height: 65.5 in
Weight: 2116.42 oz

## 2019-10-06 LAB — LIPASE, BLOOD: Lipase: 49 U/L (ref 11–51)

## 2019-10-06 LAB — CK TOTAL AND CKMB (NOT AT ARMC)
CK, MB: 9.4 ng/mL — ABNORMAL HIGH (ref 0.5–5.0)
Relative Index: 6.1 — ABNORMAL HIGH (ref 0.0–2.5)
Total CK: 153 U/L (ref 38–234)

## 2019-10-06 LAB — PROCALCITONIN: Procalcitonin: 1.88 ng/mL

## 2019-10-06 MED ORDER — HYDROCORTISONE NA SUCCINATE PF 100 MG IJ SOLR
50.0000 mg | Freq: Once | INTRAMUSCULAR | Status: AC
  Administered 2019-10-06: 50 mg via INTRAVENOUS
  Filled 2019-10-06: qty 2

## 2019-10-06 MED ORDER — CHLORHEXIDINE GLUCONATE CLOTH 2 % EX PADS
6.0000 | MEDICATED_PAD | Freq: Every day | CUTANEOUS | Status: DC
  Administered 2019-10-06: 02:00:00 6 via TOPICAL

## 2019-10-06 MED ORDER — LACTATED RINGERS IV BOLUS
250.0000 mL | Freq: Once | INTRAVENOUS | Status: AC
  Administered 2019-10-06: 250 mL via INTRAVENOUS

## 2019-10-06 MED ORDER — NOREPINEPHRINE 4 MG/250ML-% IV SOLN
0.0000 ug/min | INTRAVENOUS | Status: DC
  Administered 2019-10-06: 6 ug/min via INTRAVENOUS
  Filled 2019-10-06: qty 250

## 2019-10-06 MED ORDER — CHLORHEXIDINE GLUCONATE CLOTH 2 % EX PADS
6.0000 | MEDICATED_PAD | Freq: Every day | CUTANEOUS | Status: DC
  Administered 2019-10-07 – 2019-10-08 (×2): 6 via TOPICAL

## 2019-10-06 NOTE — Consult Note (Signed)
Advanced Heart Failure Team Consult Note   Primary Physician: Ann Held, DO PCP-Cardiologist:  Jenkins Rouge, MD  Reason for Consultation: Cardiogenic shock  HPI:    [name] is seen today for evaluation of cardiogenic shock at the request of Dr. Chase Caller.  She is a 78 y/o with ESRD, mild dementia, DM2, HTN, depression. She has been followed by Dr. Johnsie Cancel in the past for diastolic HF and mild to moderate aortic stenosis.  Last echo 1/19 EF 55-60%  Normal RV. Aov read as sclerosis without stenosis. She has not had a cardiac cath.  Admitted 10/03/19 with 2 weeks of increasing confusion, weakness, anorexia and hypotension. Found to have UTI.  Started on ceftriaxone. Head CT negative. On evening of 12/5 moved to ICU for SBP in 60s and lactate of 8.0. Treated with IVFs.  NE initiated.   Now on NE at 6. She is poor historian. Says her name is [name] [name]. "I am not the daughter."  Denies CP or SOB.   Echo obtained today shows severe biventricular dysfunction with LVEF 15-20% severe RV dysfunction with RVSP ~75. Severe Tr, moderate MR and severe low gradient AS with VR = 0.30  Review of Systems: Unable to obtain. Pertinent positives taken from chart  . General: Weight gain [ ] ; Weight loss [ ] ; Anorexia Blue.Reese ]; Fatigue Blue.Reese ]; Fever [ ] ; Chills [ ] ; Weakness [ ]   . Cardiac: Chest pain/pressure [ ] ; Resting SOB [ ] ; Exertional SOB Blue.Reese ]; Orthopnea [ ] ; Pedal Edema [ ] ; Palpitations [ ] ; Syncope [ ] ; Presyncope [ ] ; Paroxysmal nocturnal dyspnea[ ]   . Pulmonary: Cough [ ] ; Wheezing[ ] ; Hemoptysis[ ] ; Sputum [ ] ; Snoring [ ]   . GI: Vomiting[ ] ; Dysphagia[ ] ; Melena[ ] ; Hematochezia [ ] ; Heartburn[y ]; Abdominal pain [y]; Constipation [ ] ; Diarrhea [ ] ; BRBPR [ ]   . GU: Hematuria[ ] ; Dysuria [ ] ; Nocturia[ ]   . Vascular: Pain in legs with walking [ ] ; Pain in feet with lying flat [ ] ; Non-healing sores [ ] ; Stroke [ ] ; TIA [ ] ; Slurred speech [ ] ;  . Neuro: Headaches[ ] ; Vertigo[ ] ;  Seizures[ ] ; Paresthesias[ ] ;Blurred vision [ ] ; Diplopia [ ] ; Vision changes [ ]   . Ortho/Skin: Arthritis Blue.Reese ]; Joint pain Blue.Reese ]; Muscle pain [ ] ; Joint swelling [ ] ; Back Pain [ ] ; Rash [ ]   . Psych: Depression[y ]; Anxiety[ ]   . Heme: Bleeding problems [ ] ; Clotting disorders [ ] ; Anemia [ ]   . Endocrine: Diabetes [ y]; Thyroid dysfunction[ ]   Home Medications Prior to Admission medications   Medication Sig Start Date End Date Taking? Authorizing Provider  acetaminophen (TYLENOL) 325 MG tablet Take 650 mg by mouth every 6 (six) hours as needed for mild pain.   Yes [provider]  carvedilol (COREG) 25 MG tablet Take 1 tablet (25 mg total) by mouth 2 (two) times daily with a meal. 07/04/19  Yes Ann Held, DO  cinacalcet (SENSIPAR) 30 MG tablet Take 30 mg by mouth daily.  06/05/19  Yes [provider]  folic acid-vitamin b complex-vitamin c-selenium-zinc (DIALYVITE) 3 MG TABS tablet Take 1 tablet by mouth daily.   Yes [provider]  furosemide (LASIX) 40 MG tablet TAKE 1 TABLET BY MOUTH EVERY DAY Patient taking differently: Take 40 mg by mouth daily.  06/04/19  Yes Roma Schanz R, DO  isosorbide mononitrate (IMDUR) 30 MG 24 hr tablet TAKE 1 TABLET (30 MG  TOTAL) BY MOUTH 2 (TWO) TIMES DAILY. 09/30/19  Yes Roma Schanz R, DO  nitroGLYCERIN (NITROSTAT) 0.4 MG SL tablet Place 1 tablet (0.4 mg total) under the tongue every 5 (five) minutes as needed for chest pain. 0000000  Yes Delora Fuel, MD  sevelamer carbonate (RENVELA) 800 MG tablet Take 2 tablets (1,600 mg total) by mouth 3 (three) times daily with meals. Patient not taking: Reported on 06/10/2019 10/18/18   Kayleen Memos, DO    Past Medical History: Past Medical History:  Diagnosis Date  . DEPRESSION   . DIABETES MELLITUS, TYPE I   . ESRD (end stage renal disease) on dialysis ([place])    MWF HD  . GERD   . GLAUCOMA   . Headache(784.0)   . Hepatitis B carrier ([place])    05/2009: neg Hep C;  Hep B: core pos, Surf neg; fatty liver US 8/10 - 7/13  . HYPERLIPIDEMIA   . HYPERTENSION   . Memory deficit   . OSTEOPENIA   . POSTMENOPAUSAL STATUS   . Pulmonary edema   . Unspecified vitamin D deficiency     Past Surgical History: Past Surgical History:  Procedure Laterality Date  . A/V SHUNTOGRAM N/A 09/06/2018   Procedure: A/V SHUNTOGRAM - Left AV;  Surgeon: Marty Heck, MD;  Location: [facility];  Service: Cardiovascular;  Laterality: N/A;  . A/V SHUNTOGRAM Left 06/13/2019   Procedure: A/V SHUNTOGRAM;  Surgeon: Marty Heck, MD;  Location: [facility];  Service: Cardiovascular;  Laterality: Left;  . AV FISTULA PLACEMENT Left 05/29/2014   Procedure: INSERTION OF ARTERIOVENOUS (AV) GORE-TEX GRAFT ARM-LEFT;  Surgeon: Angelia Mould, MD;  Location: [facility];  Service: Vascular;  Laterality: Left;  . CHOLECYSTECTOMY  02/12/07  . INSERTION OF DIALYSIS CATHETER Right 05/27/2014   Procedure: INSERTION OF DIALYSIS CATHETER;  Surgeon: Angelia Mould, MD;  Location: [facility];  Service: Vascular;  Laterality: Right;  . IR DIALY SHUNT INTRO [place] W/IMG LEFT Left 12/14/2018  . PERIPHERAL VASCULAR BALLOON ANGIOPLASTY Left 09/06/2018   Procedure: PERIPHERAL VASCULAR BALLOON ANGIOPLASTY;  Surgeon: Marty Heck, MD;  Location: [facility];  Service: Cardiovascular;  Laterality: Left;  arm shunt  . PERIPHERAL VASCULAR BALLOON ANGIOPLASTY  06/13/2019   Procedure: PERIPHERAL VASCULAR BALLOON ANGIOPLASTY;  Surgeon: Marty Heck, MD;  Location: [facility];  Service: Cardiovascular;;  . REFRACTIVE SURGERY  07/29/09   Dr. Bing Plume  . TONSILLECTOMY      Family History: Family History  Problem Relation Age of Onset  . Kidney disease Mother   . Coronary artery disease Other   . Diabetes Other   . Hypertension Other   . Arthritis Other   . Kidney disease Sister     Social History: Social History   Socioeconomic  History  . Marital status: Married    Spouse name: Not on file  . Number of children: Not on file  . Years of education: Not on file  . Highest education level: Not on file  Occupational History  . Not on file  Social Needs  . Financial resource strain: Not on file  . Food insecurity    Worry: Not on file    Inability: Not on file  . Transportation needs    Medical: Not on file    Non-medical: Not on file  Tobacco Use  . Smoking status: Never Smoker  . Smokeless tobacco: Never Used  . Tobacco comment: Married x's 71yrs, 6  kids-scattered [employer]. Retired Consulting civil engineer  Substance and Sexual Activity  . Alcohol use: No  . Drug use: No  . Sexual activity: Not Currently    Birth control/protection: None  Lifestyle  . Physical activity    Days per week: Not on file    Minutes per session: Not on file  . Stress: Not on file  Relationships  . Social Herbalist on phone: Not on file    Gets together: Not on file    Attends religious service: Not on file    Active member of club or organization: Not on file    Attends meetings of clubs or organizations: Not on file    Relationship status: Not on file  Other Topics Concern  . Not on file  Social History Narrative   03/06/19 Lives with daughter     Allergies:  Allergies  Allergen Reactions  . Hydralazine Itching  . Robaxin [Methocarbamol] Other (See Comments)    Dizziness     Objective:    Vital Signs:   Temp:  [96.8 F (36 C)-97.8 F (36.6 C)] 96.9 F (36.1 C) (12/06 1124) Pulse Rate:  [27-112] 72 (12/06 0500) Resp:  [13-23] 17 (12/06 0641) BP: (60-123)/(11-96) 105/18 (12/06 0641) SpO2:  [48 %-100 %] 95 % (12/06 0500) Weight:  [60 kg] 60 kg (12/06 0234) Last BM Date: 10/04/19  Weight change: Filed Weights   10/04/19 1820 10/05/19 0500 10/06/19 0234  Weight: 56.9 kg 59.9 kg 60 kg    Intake/Output:   Intake/Output Summary (Last 24 hours) at 10/06/2019 1351 Last data filed at 10/06/2019 0700 Gross  per 24 hour  Intake 677.66 ml  Output -  Net 677.66 ml      Physical Exam    General:  Elderly frail Chronically-ill appearing. No resp difficulty HEENT: normal Neck: supple. JVP to ear . Carotids 2+ bilat; + bruits. No lymphadenopathy or thyromegaly appreciated. Cor: PMI nondisplaced. Regular rate & rhythm. 3/6 high-pitched AS. With moderately reduced s2  Lungs: clear Abdomen: soft, nontender, nondistended. No hepatosplenomegaly. No bruits or masses. Good bowel sounds. Extremities: no cyanosis, clubbing, rash, tr edema  LUE AVF Neuro: awake confused moves all 4s  Telemetry   NSR 70s Personally reviewed   EKG    NSR 72 LVH. diffuse non-specific ST-T abnormalities. Personally reviewed   Labs   Basic Metabolic Panel: Recent Labs  Lab 10/03/19 1600 10/04/19 0035 10/04/19 0207 10/04/19 1315 10/05/19 1238 10/06/19 0316  NA 139  --  141 142 137 134*  K 4.4  --  3.7 4.4 4.6 5.0  CL 98  --  102 102 98 97*  CO2 32  --  27 21* 21* 20*  GLUCOSE 113*  --  128* 48* 87 141*  BUN 12  --  14 21 16  32*  CREATININE 3.51* 3.82* 3.79* 4.17* 2.83* 3.50*  CALCIUM 7.8*  --  7.6* 8.4* 8.5* 8.2*  MG  --   --   --   --  1.8  --   PHOS  --   --  3.6 4.9* 4.5 5.4*    Liver Function Tests: Recent Labs  Lab 10/03/19 1600 10/04/19 0207 10/04/19 1315 10/05/19 1238 10/06/19 0316  AST 55*  --   --  80*  --   ALT 26  --   --  38  --   ALKPHOS 141*  --   --  152*  --   BILITOT 0.9  --   --  1.5*  --  PROT 6.9  --   --  7.0  --   ALBUMIN 2.6* 2.3* 2.8* 2.8* 2.5*   Recent Labs  Lab 10/06/19 1013  LIPASE 49   No results for input(s): AMMONIA in the last 168 hours.  CBC: Recent Labs  Lab 10/03/19 1600  10/04/19 0207 10/04/19 1315 10/05/19 0014 10/05/19 1053 10/06/19 0316  WBC 2.6*   < > 2.8* 6.2 6.3 6.4 8.6  NEUTROABS 1.9  --   --   --   --   --   --   HGB 9.1*   < > 8.5* 10.2* 9.5* 10.2* 11.3*  HCT 29.0*   < > 26.8* 33.1* 30.5* 31.5* 35.6*  MCV 105.1*   < > 104.7*  106.4* 103.0* 102.9* 102.6*  PLT 62*   < > 56* 65* 75* 84* 98*   < > = values in this interval not displayed.    Cardiac Enzymes: Recent Labs  Lab 10/06/19 1013  CKTOTAL 153  CKMB 9.4*    BNP: BNP (last 3 results) No results for input(s): BNP in the last 8760 hours.  ProBNP (last 3 results) No results for input(s): PROBNP in the last 8760 hours.   CBG: Recent Labs  Lab 10/05/19 1928 10/05/19 2310 10/06/19 0307 10/06/19 0745 10/06/19 1137  GLUCAP 134* 125* 129* 107* 138*    Coagulation Studies: Recent Labs    10/03/19 Feb 14, 2053  LABPROT 15.7*  INR 1.3*     Imaging    No results found.   Medications:     Current Medications: . Chlorhexidine Gluconate Cloth  6 each Topical Q0600  . Chlorhexidine Gluconate Cloth  6 each Topical Q0600  . cinacalcet  30 mg Oral Q supper  . darbepoetin (ARANESP) injection - DIALYSIS  100 mcg Intravenous Q Fri-HD  . doxercalciferol  1 mcg Intravenous Q M,W,F-HD  . midodrine  10 mg Oral TID WC  . multivitamin  1 tablet Oral QHS  . [START ON 10/07/2019] vancomycin  500 mg Intravenous Q M,W,F-HD     Infusions: . sodium chloride Stopped (10/05/19 0224)  . ceFEPime (MAXIPIME) IV Stopped (10/05/19 1246)  . dextrose 20 mL/hr at 10/06/19 0700  . lactated ringers    . norepinephrine (LEVOPHED) Adult infusion 6 mcg/min (10/06/19 0700)       Assessment/Plan   1. Acute systolic HF with cardiogenic shock - Echo today with severe biventricular dysfunction with LVEF 15-20% severe RV dysfunction with RVSP ~75. Severe Tr, moderate MR and severe low gradient AS with VR = 0.30 - echo in 1/19 with EF 55-60% - remains tenuous on NE at 6. Have recommended placing central line to follow CVP and co-ox but patient has been refusing - etiology of cardiomyopathy is unclear. ? Septic, ischemic or AS. Ideally would have cath but overall condition seems quite tenuous and Palliative/Hospice may be more appropriate - volume status managed by HD but  limited with hypotension  2. UTI/sepsis - being treated for UTI. Bcx 1/1 on 12/3 NGTD - COVID negative - PCT 1.88 - on vanc/cefepime per CCM  3. Severe AS - echo c/w low-gradient severe AS with dimensionless index 0.30 - not TAVR candidate currently due to dementia  4. ESRD - managed with HD. Renal following  5. Dementia - baseline unclear. Will need to d/w family  She has severe biventricular cardiac dysfunction and severe low-gradient AS with cardiogenic shock on top of baseline ESRD and probable component of septic shock. Ideally would recommend central access to follow CVP and  co-ox with cath to follow but currently overall condition and dementia prohibit. Will need to consider Palliative Care consult for [place]. D/w Dr, Chase Caller.   CRITICAL CARE Performed by: Glori Bickers  Total critical care time: 45 minutes  Critical care time was exclusive of separately billable procedures and treating other patients.  Critical care was necessary to treat or prevent imminent or life-threatening deterioration.  Critical care was time spent personally by me (independent of midlevel providers or residents) on the following activities: development of treatment plan with patient and/or surrogate as well as nursing, discussions with consultants, evaluation of patient's response to treatment, examination of patient, obtaining history from patient or surrogate, ordering and performing treatments and interventions, ordering and review of laboratory studies, ordering and review of radiographic studies, pulse oximetry and re-evaluation of patient's condition.    Length of Stay: 3  Glori Bickers, MD  10/06/2019, 1:51 PM  Advanced Heart Failure Team Pager [phone] (M-F; 7a - 4p)  Please contact [facility] for night-coverage after hours (4p -7a ) and weekends on [email or web address]

## 2019-10-06 NOTE — Progress Notes (Signed)
  Echocardiogram 2D Echocardiogram has been performed.  [name] [name] 10/06/2019, 12:32 PM

## 2019-10-06 NOTE — Progress Notes (Addendum)
   Cal from Dr Jeffie Pollock  - new onset biventricular failure +  - lactate now improved but still high - likely cause is poor ef - patient on and off levophed  Plan  - chf service consult - might need cvl    SIGNATURE    Dr. Brand Males, M.D., F.C.C.P,  Pulmonary and Critical Care Medicine Staff Physician, [place] Director - Interstitial Lung Disease  Program  Pulmonary [place] [facility] [place], [place], [address]  Pager: [phone], If no answer or between  15:00h - 7:00h: call [phone] Telephone: [phone]  1:51 PM 10/06/2019  g

## 2019-10-06 NOTE — Progress Notes (Signed)
Called  [facility] Dr Ulysees Barns to inform of Dr Haroldine Laws recommendations about hospice. She indicated she was not primary anymore because of pressors though her name still on attending list. Patient off pressors today  Plan  - chagne to ccm primary - needs goals of care  - will page flor manager and ask [facility] to be primary 10/07/19 - transfer to [facility]     SIGNATURE    Dr. Brand Males, M.D., F.C.C.P,  Pulmonary and Critical Care Medicine Staff Physician, [place] Director - Interstitial Lung Disease  Program  Pulmonary [place] [facility] [place], [place], [address]  Pager: [phone], If no answer or between  15:00h - 7:00h: call [phone] Telephone: [phone]  6:07 PM 10/06/2019

## 2019-10-06 NOTE — Progress Notes (Signed)
[facility] ROUNDING NOTE   Subjective:   78 year old lady with history of end-stage renal disease Monday Wednesday Friday dialysis secondary to diabetes.  She also has depression gastroesophageal reflux disease noncompliance and hypertension.  She was brought in by family due to altered mental status and complaints of pain left leg which is nonspecific.       She underwent dialysis 10/04/2019 with removal of 930 cc.  Her next dialysis treatment will be 10/07/2019  Blood pressure 104/31 pulse 75 temperature 97.8 O2 sats 90% room air  Sodium 134 potassium 5.0 chloride 97 CO2 20 BUN 32 creatinine 3.5 glucose 141 calcium 8.2 phosphorus 5.4 albumin 2.5 WBC 8.6 hemoglobin 11.3 platelets 98  IV norepinephrine IV Maxipime IV vancomycin  Sensipar 30 mg daily Aranesp 100 mcg administered 10/04/2019, multivitamins 1 daily Hectorol 1 mcg Monday Wednesday Friday, midodrine 10 mg 3 times daily  Objective:  Vital signs in last 24 hours:  Temp:  [96.8 F (36 C)-97.8 F (36.6 C)] 97.8 F (36.6 C) (12/06 0300) Pulse Rate:  [25-112] 72 (12/06 0500) Resp:  [13-25] 17 (12/06 0641) BP: (60-123)/(11-96) 105/18 (12/06 0641) SpO2:  [48 %-100 %] 95 % (12/06 0500) Weight:  [60 kg] 60 kg (12/06 0234)  Weight change: 3.936 kg Filed Weights   10/04/19 1820 10/05/19 0500 10/06/19 0234  Weight: 56.9 kg 59.9 kg 60 kg    Intake/Output: I/O last 3 completed shifts: In: 1649.7 [P.O.:222; I.V.:574.7; IV Piggyback:853] Out: 50 [Urine:50]   Intake/Output this shift:  No intake/output data recorded. General: Chronically ill-appearing lady no obvious distress Neck:  Supple; no masses or thyromegaly. JVP not elevated Lungs:  Clear throughout to auscultation.   No wheezes, crackles, or rhonchi. No acute distress. Heart:  Regular rate and rhythm; no murmurs, clicks, rubs,  or gallops. Abdomen:  Soft, nontender and nondistended. No masses, hepatosplenomegaly or hernias noted. Normal bowel sounds,  without guarding, and without rebound.   Extremities   left AV graft no edema   Basic Metabolic Panel: Recent Labs  Lab 10/03/19 1600 10/04/19 0035 10/04/19 0207 10/04/19 1315 10/05/19 1238 10/06/19 0316  NA 139  --  141 142 137 134*  K 4.4  --  3.7 4.4 4.6 5.0  CL 98  --  102 102 98 97*  CO2 32  --  27 21* 21* 20*  GLUCOSE 113*  --  128* 48* 87 141*  BUN 12  --  14 21 16  32*  CREATININE 3.51* 3.82* 3.79* 4.17* 2.83* 3.50*  CALCIUM 7.8*  --  7.6* 8.4* 8.5* 8.2*  MG  --   --   --   --  1.8  --   PHOS  --   --  3.6 4.9* 4.5 5.4*    Liver Function Tests: Recent Labs  Lab 10/03/19 1600 10/04/19 0207 10/04/19 1315 10/05/19 1238 10/06/19 0316  AST 55*  --   --  80*  --   ALT 26  --   --  38  --   ALKPHOS 141*  --   --  152*  --   BILITOT 0.9  --   --  1.5*  --   PROT 6.9  --   --  7.0  --   ALBUMIN 2.6* 2.3* 2.8* 2.8* 2.5*   No results for input(s): LIPASE, AMYLASE in the last 168 hours. No results for input(s): AMMONIA in the last 168 hours.  CBC: Recent Labs  Lab 10/03/19 1600  10/04/19 0207 10/04/19 1315 10/05/19 0014 10/05/19  1053 10/06/19 0316  WBC 2.6*   < > 2.8* 6.2 6.3 6.4 8.6  NEUTROABS 1.9  --   --   --   --   --   --   HGB 9.1*   < > 8.5* 10.2* 9.5* 10.2* 11.3*  HCT 29.0*   < > 26.8* 33.1* 30.5* 31.5* 35.6*  MCV 105.1*   < > 104.7* 106.4* 103.0* 102.9* 102.6*  PLT 62*   < > 56* 65* 75* 84* 98*   < > = values in this interval not displayed.    Cardiac Enzymes: No results for input(s): CKTOTAL, CKMB, CKMBINDEX, TROPONINI in the last 168 hours.  BNP: Invalid input(s): POCBNP  CBG: Recent Labs  Lab 10/05/19 1620 10/05/19 1928 10/05/19 2310 10/06/19 0307 10/06/19 0745  GLUCAP 90 134* 125* 129* 107*    Microbiology: Results for orders placed or performed during the hospital encounter of 10/03/19  Culture, blood (routine x 2)     Status: None (Preliminary result)   Collection Time: 10/03/19  8:54 PM   Specimen: BLOOD  Result Value Ref  Range Status   Specimen Description BLOOD RIGHT ARM  Final   Special Requests   Final    BOTTLES DRAWN AEROBIC AND ANAEROBIC Blood Culture adequate volume   Culture   Final    NO GROWTH 3 DAYS Performed at [facility], [place] [address] [place], [place] [address]    Report Status PENDING  Incomplete  SARS CORONAVIRUS 2 (TAT 6-24 HRS) Nasopharyngeal Nasopharyngeal Swab     Status: None   Collection Time: 10/04/19 12:40 AM   Specimen: Nasopharyngeal Swab  Result Value Ref Range Status   SARS Coronavirus 2 NEGATIVE NEGATIVE Final    Comment: (NOTE) SARS-CoV-2 target nucleic acids are NOT DETECTED. The SARS-CoV-2 RNA is generally detectable in upper and lower respiratory specimens during the acute phase of infection. Negative results do not preclude SARS-CoV-2 infection, do not rule out co-infections with other pathogens, and should not be used as the sole basis for treatment or other patient management decisions. Negative results must be combined with clinical observations, patient history, and epidemiological information. The expected result is Negative. Fact Sheet for Patients: SugarRoll.be Fact Sheet for Healthcare Providers: https://www.woods-mathews.com/ This test is not yet approved or cleared by the [place] FDA and  has been authorized for detection and/or diagnosis of SARS-CoV-2 by FDA under an Emergency Use Authorization (EUA). This EUA will remain  in effect (meaning this test can be used) for the duration of the COVID-19 declaration under Section 56 4(b)(1) of the Act, 21 U.S.C. section 360bbb-3(b)(1), unless the authorization is terminated or revoked sooner. Performed at [facility], [place] [address] [place], [place] [address]   MRSA PCR Screening     Status: None   Collection Time: 10/05/19  3:31 AM   Specimen: Nasopharyngeal  Result Value Ref Range Status   MRSA by PCR NEGATIVE NEGATIVE Final    Comment:         The GeneXpert MRSA Assay (FDA approved for NASAL specimens only), is one component of a comprehensive MRSA colonization surveillance program. It is not intended to diagnose MRSA infection nor to guide or monitor treatment for MRSA infections. Performed at [facility], [place] [address] [place], [place] [address]     Coagulation Studies: Recent Labs    10/03/19 2053/02/08  LABPROT 15.7*  INR 1.3*    Urinalysis: Recent Labs    10/03/19 2013-02-08  [place]  LABSPEC 1.025  PHURINE 5.5  GLUCOSEU NEGATIVE  HGBUR MODERATE*  BILIRUBINUR MODERATE*  KETONESUR 15*  PROTEINUR >300*  NITRITE NEGATIVE  LEUKOCYTESUR MODERATE*      Imaging: Dg Chest Port 1 View  Result Date: 10/05/2019 CLINICAL DATA:  Reason for exam: R/O sepsis in inpatient. Hx of diabetes, HTN, pulmonary edema. EXAM: PORTABLE CHEST 1 VIEW COMPARISON:  10/03/2019 FINDINGS: Stable enlarged cardiac silhouette. Moderate volume RIGHT pleural effusion unchanged. No pulmonary edema. No pneumothorax. Vascular stent in the LEFT axilla. IMPRESSION: No significant change in moderate RIGHT pleural effusion with cardiomegaly. Electronically Signed   By: Suzy Bouchard M.D.   On: 10/05/2019 07:21     Medications:   . sodium chloride Stopped (10/05/19 0224)  . ceFEPime (MAXIPIME) IV Stopped (10/05/19 1246)  . dextrose 20 mL/hr at 10/06/19 0700  . norepinephrine (LEVOPHED) Adult infusion 6 mcg/min (10/06/19 0700)   . Chlorhexidine Gluconate Cloth  6 each Topical Q0600  . cinacalcet  30 mg Oral Q supper  . darbepoetin (ARANESP) injection - DIALYSIS  100 mcg Intravenous Q Fri-HD  . doxercalciferol  1 mcg Intravenous Q M,W,F-HD  . midodrine  10 mg Oral TID WC  . multivitamin  1 tablet Oral QHS  . [START ON 10/07/2019] vancomycin  500 mg Intravenous Q M,W,F-HD   sodium chloride, acetaminophen **OR** acetaminophen, alteplase, calcium carbonate (dosed in mg elemental calcium), camphor-menthol **AND** hydrOXYzine,  docusate sodium, feeding supplement (NEPRO CARB STEADY), haloperidol lactate, heparin, lidocaine (PF), lidocaine-prilocaine, ondansetron **OR** ondansetron (ZOFRAN) IV, pentafluoroprop-tetrafluoroeth, sorbitol  Assessment/ Plan:   ESRD-Monday Wednesday Friday dialysis estimated fluid gains 0 to 1 L estimated dry weight 52.5.  She has an AV graft left upper extremity.  She underwent successful dialysis 10/04/2019.  No indications for CRRT at this point  ANEMIA-it appears that she has some thrombocytopenia.  Her white count and hemoglobin have improved.  MBD-calcium and phosphorus appears to be well controlled and continues doxercalciferol 1 mcg q. treatment  HTN/VOL-appears to be controlled with very minimal fluid gains  ACCESS-AV graft  Hypotension.  Broad-spectrum antibiotics started and Levophed initiated.  TSH 4.  Cortisol 60.7.  No endocrine etiology.  Blood cultures - 10/03/2019   LOS: 3 Sherril Croon @TODAY @9 :27 AM

## 2019-10-06 NOTE — Progress Notes (Signed)
[facility] Progress Note Patient Name: [name] [name] DOB: Nov 28, 1940 MRN: [id number]   Date of Service  10/06/2019  HPI/Events of Note  Hypotension - Request to restart Norepinephrine IV infusion.   eICU Interventions  Will order: 1. Norepinephrine IV infusion. Titrate to MAP >= 65.      Intervention Category Major Interventions: Hypotension - evaluation and management  Lysle Dingwall 10/06/2019, 7:39 PM

## 2019-10-06 NOTE — Progress Notes (Signed)
NAME:  [name] [name], MRN:  [id number], DOB:  09/22/1941, LOS: 3 ADMISSION DATE:  10/03/2019, CONSULTATION DATE:  10/06/19  CHIEF COMPLAINT:  hypotension  BRIEF  78 y.o. F with PMH of Dementia, ESRD on MWF dialysis, Type 1 DM, HL, HTN who presented on 12/3 with confusion and weakness.  Family noted decreased oral intake and diaphoresis and chills.  She does make urine and had WBC's and bacteria, so was started on Ceftriaxone.  CT head was negative for acute findings.   Starting 12/4 pt's BP started down-trending, lactic acid 2.4.  She was started on NS 75cc/hr and overnight BP as low as 56/42.  She was given a total of 2L IVF boluses and transferred to the ICU.   On exam, pt is awake and alert, states we are in the wrong room and denies complaints.  Oriented only to person.   Past Medical History   has a past medical history of DEPRESSION, DIABETES MELLITUS, TYPE I, ESRD (end stage renal disease) on dialysis ([place]), GERD, GLAUCOMA, Headache(784.0), Hepatitis B carrier ([place]), HYPERLIPIDEMIA, HYPERTENSION, Memory deficit, OSTEOPENIA, POSTMENOPAUSAL STATUS, Pulmonary edema, and Unspecified vitamin D deficiency.   Significant Hospital Events   12/3 Admit to Hospitalists 12/5 - Pt denying lung ultrasound.  Repeat rounds. Not on vent. Awake and alert. And ORiented. Diastolic low - SBP > 123XX123 with MAP > 50/55. Refuses IV, CVL, blood draws.Says goal is to go home well and asap. Says blood test last week is sufficient. Seems to have capacity. On 17mcg levophd via one PIV.  Per RN s./p 1L HD volume removal yesterday   Consults:  PCCM Nephrology  Procedures:    Significant Diagnostic Tests:  12/5 CXR>> Echo>>  Micro Data:  UC>>never sent 12/3 BCx2>> 12/4 Sars-CoV-2>>negative  Antimicrobials:  Ceftriaxone 12/3-125 Cefepime 12/5- Vancomycin 12/5-  Interim history/subjective:    10/06/2019 - Seems to h ave responded to fluid and midodrine yesterdat. Not on ventilator. Was off levophed  yesterday and then 52mcg overnight. Currently off x 0.5h. SBP 104/31 (MAP 51). Pleasant and baseline "Dementia" per RN. Better mentally but still stubborn per RN. BP Cuff in leg due to poor vasculature  Lactate yesterday 7.5 =- resulted late due to patient refusal with blood draw  Coritsol yesterday - 61 (post hydrocortisone)  Objective   Blood pressure (!) 105/18, pulse 72, temperature 97.8 F (36.6 C), temperature source Oral, resp. rate 17, height 5' 5.5" (1.664 m), weight 60 kg, SpO2 95 %.        Intake/Output Summary (Last 24 hours) at 10/06/2019 0912 Last data filed at 10/06/2019 0700 Gross per 24 hour  Intake 1427.66 ml  Output -  Net 1427.66 ml   Filed Weights   10/04/19 1820 10/05/19 0500 10/06/19 0234  Weight: 56.9 kg 59.9 kg 60 kg     General Appearance:  Looks deconditioned frail Head:  Normocephalic, without obvious abnormality, atraumatic Eyes:  PERRL - yes, conjunctiva/corneas - muddy     Ears:  Normal external ear canals, both ears Nose:  G tube - no Throat:  ETT TUBE - no , OG tube - no Neck:  Supple,  No enlargement/tenderness/nodules Lungs: Clear to auscultation bilaterally, Heart:  S1 and S2 normal, no murmur, CVP - x.  Pressors - off Abdomen:  Soft, no masses, no organomegaly Genitalia / Rectal:  Not done Extremities:  Extremities- intact Skin:  ntact in exposed areas . Sacral area - not examined Neurologic:  Currently sleeping  Resolved Hospital Problem list     Assessment & Plan:   Hypotension, possible sepsis secondary to UTI vs PNA - -initial CXR with R pleural effusion vs. Infiltrate  10/06/2019 -off levophjed. Still looks dry.  Echo result pending (Bedsid  ? Biventricular dysfn). Has low diastolic  P: -repeat LR bolus 250cc  - levophed if needed - empiric hydrocort x 1 repeat 10/06/2019  and then based on clinical course  - continue midodrine oral since 10/05/2019 - SBP goal  > 95    Concern for sepsis   10/06/2019 -  afebrile. PCT equivocal. Cultures pending  plan Cefepime/Vanc -follow BC, send UC -check procalcitonin, Lactic acid, TSH and cortisol - refused yesterday -await urine strep and legionella -Levophed to maintain MAP of 65   Lactic acidosis - severe. 2.3 at ICU transfer and peaked 8 10/05/2019 with poor clearance  12/6/ - no result avail yet  Plan  - recheck lactate, trop and CK, lipase - 250cc LR bolus  ESRD -MWF dialysis -per renal   Type 1 DM  10/06/2019 - sugar 70 x yesterday. Sugar ok today  Plan  - d5W at 20cc/hr -continue long aciting insulin and SS coverage   Dementia - -increasingly combative after transfer, prn Haldol 1mg   10/06/2019 - improved encephaloipathy  -d/c Ambien   Chronic Thrombocytopenia   12/6 - - improving; -does not appear that pt has had outpatient work-up yet  Plan  - monitor  Anemia of renal and critical illness  10/06/2019 - no bleeding  - PRBC for hgb </= 6.9gm%    - exceptions are   -  if ACS susepcted/confirmed then transfuse for hgb </= 8.0gm%,  or    -  active bleeding with hemodynamic instability, then transfuse regardless of hemoglobin value   At at all times try to transfuse 1 unit prbc as possible with exception of active hemorrhage     Best practice:  Diet: diabetic Pain/Anxiety/Delirium protocol (if indicated): Haldol VAP protocol (if indicated): n/a DVT prophylaxis: SCD's GI prophylaxis: n/a Glucose control: SSI Mobility: bed rest Code Status: full Family Communication: per primary Disposition: ICU   GlobaL: will challenge with 250cc LR bolus and also recheck lactate + add ck, lipase, trop 10/06/2019 . I fpatient is clinically beter tomorrow 10/07/2019 CCM will sign off   ATTESTATION & SIGNATURE   The patient [name] [name] is critically ill with multiple organ systems failure and requires high complexity decision making for assessment and support, frequent evaluation and titration of therapies, application  of advanced monitoring technologies and extensive interpretation of multiple databases.   Critical Care Time devoted to patient care services described in this note is  30  Minutes. This time reflects time of care of this signee Dr Brand Males. This critical care time does not reflect procedure time, or teaching time or supervisory time of PA/NP/Med student/Med Resident etc but could involve care discussion time     Dr. Brand Males, M.D., [facility].C.P Pulmonary and Critical Care Medicine Staff Physician [facility] Pager: [phone], If no answer or between  15:00h - 7:00h: call [phone]  10/06/2019 9:12 AM   LABS    PULMONARY No results for input(s): PHART, PCO2ART, PO2ART, HCO3, TCO2, O2SAT in the last 168 hours.  Invalid input(s): PCO2, PO2  CBC Recent Labs  Lab 10/05/19 0014 10/05/19 1053 10/06/19 0316  HGB 9.5* 10.2* 11.3*  HCT 30.5* 31.5* 35.6*  WBC 6.3 6.4 8.6  PLT 75* 84* 98*    COAGULATION Recent Labs  Lab 10/03/19 2054  INR 1.3*    CARDIAC  No results for input(s): TROPONINI in the last 168 hours. No results for input(s): PROBNP in the last 168 hours.   CHEMISTRY Recent Labs  Lab 10/03/19 1600 10/04/19 0035 10/04/19 0207 10/04/19 1315 10/05/19 1238 10/06/19 0316  NA 139  --  141 142 137 134*  K 4.4  --  3.7 4.4 4.6 5.0  CL 98  --  102 102 98 97*  CO2 32  --  27 21* 21* 20*  GLUCOSE 113*  --  128* 48* 87 141*  BUN 12  --  14 21 16  32*  CREATININE 3.51* 3.82* 3.79* 4.17* 2.83* 3.50*  CALCIUM 7.8*  --  7.6* 8.4* 8.5* 8.2*  MG  --   --   --   --  1.8  --   PHOS  --   --  3.6 4.9* 4.5 5.4*   Estimated Creatinine Clearance: 12.4 mL/min (A) (by C-G formula based on SCr of 3.5 mg/dL (H)).   LIVER Recent Labs  Lab 10/03/19 1600 10/03/19 2054 10/04/19 0207 10/04/19 1315 10/05/19 1238 10/06/19 0316  AST 55*  --   --   --  80*  --   ALT 26  --   --   --  38  --   ALKPHOS 141*  --    --   --  152*  --   BILITOT 0.9  --   --   --  1.5*  --   PROT 6.9  --   --   --  7.0  --   ALBUMIN 2.6*  --  2.3* 2.8* 2.8* 2.5*  INR  --  1.3*  --   --   --   --      INFECTIOUS Recent Labs  Lab 10/04/19 0035 10/05/19 1053 10/05/19 1238 10/05/19 1551 10/06/19 0316  LATICACIDVEN 2.3* 8.0*  --  7.5*  --   PROCALCITON  --   --  1.43  --  1.88     ENDOCRINE CBG (last 3)  Recent Labs    10/05/19 2310 10/06/19 0307 10/06/19 0745  GLUCAP 125* 129* 107*         IMAGING x48h  - image(s) personally visualized  -   highlighted in bold Dg Chest Port 1 View  Result Date: 10/05/2019 CLINICAL DATA:  Reason for exam: R/O sepsis in inpatient. Hx of diabetes, HTN, pulmonary edema. EXAM: PORTABLE CHEST 1 VIEW COMPARISON:  10/03/2019 FINDINGS: Stable enlarged cardiac silhouette. Moderate volume RIGHT pleural effusion unchanged. No pulmonary edema. No pneumothorax. Vascular stent in the LEFT axilla. IMPRESSION: No significant change in moderate RIGHT pleural effusion with cardiomegaly. Electronically Signed   By: Suzy Bouchard M.D.   On: 10/05/2019 07:21

## 2019-10-07 DIAGNOSIS — R579 Shock, unspecified: Secondary | ICD-10-CM | POA: Diagnosis not present

## 2019-10-07 DIAGNOSIS — R570 Cardiogenic shock: Secondary | ICD-10-CM

## 2019-10-07 DIAGNOSIS — Z515 Encounter for palliative care: Secondary | ICD-10-CM

## 2019-10-07 DIAGNOSIS — N186 End stage renal disease: Secondary | ICD-10-CM

## 2019-10-07 DIAGNOSIS — R627 Adult failure to thrive: Secondary | ICD-10-CM

## 2019-10-07 DIAGNOSIS — Z7189 Other specified counseling: Secondary | ICD-10-CM

## 2019-10-07 DIAGNOSIS — N390 Urinary tract infection, site not specified: Secondary | ICD-10-CM | POA: Diagnosis not present

## 2019-10-07 DIAGNOSIS — Z992 Dependence on renal dialysis: Secondary | ICD-10-CM

## 2019-10-07 LAB — CBC
HCT: 32.1 % — ABNORMAL LOW (ref 36.0–46.0)
Hemoglobin: 10.5 g/dL — ABNORMAL LOW (ref 12.0–15.0)
MCH: 32.5 pg (ref 26.0–34.0)
MCHC: 32.7 g/dL (ref 30.0–36.0)
MCV: 99.4 fL (ref 80.0–100.0)
Platelets: 81 10*3/uL — ABNORMAL LOW (ref 150–400)
RBC: 3.23 MIL/uL — ABNORMAL LOW (ref 3.87–5.11)
RDW: 17.3 % — ABNORMAL HIGH (ref 11.5–15.5)
WBC: 7.6 10*3/uL (ref 4.0–10.5)
nRBC: 3.8 % — ABNORMAL HIGH (ref 0.0–0.2)

## 2019-10-07 LAB — HEPATIC FUNCTION PANEL
ALT: 92 U/L — ABNORMAL HIGH (ref 0–44)
AST: 193 U/L — ABNORMAL HIGH (ref 15–41)
Albumin: 2.5 g/dL — ABNORMAL LOW (ref 3.5–5.0)
Alkaline Phosphatase: 150 U/L — ABNORMAL HIGH (ref 38–126)
Bilirubin, Direct: 0.6 mg/dL — ABNORMAL HIGH (ref 0.0–0.2)
Indirect Bilirubin: 0.5 mg/dL (ref 0.3–0.9)
Total Bilirubin: 1.1 mg/dL (ref 0.3–1.2)
Total Protein: 6.9 g/dL (ref 6.5–8.1)

## 2019-10-07 LAB — RENAL FUNCTION PANEL
Albumin: 2.4 g/dL — ABNORMAL LOW (ref 3.5–5.0)
Anion gap: 17 — ABNORMAL HIGH (ref 5–15)
BUN: 51 mg/dL — ABNORMAL HIGH (ref 8–23)
CO2: 21 mmol/L — ABNORMAL LOW (ref 22–32)
Calcium: 7.7 mg/dL — ABNORMAL LOW (ref 8.9–10.3)
Chloride: 96 mmol/L — ABNORMAL LOW (ref 98–111)
Creatinine, Ser: 4.15 mg/dL — ABNORMAL HIGH (ref 0.44–1.00)
GFR calc Af Amer: 11 mL/min — ABNORMAL LOW (ref >60)
GFR calc non Af Amer: 10 mL/min — ABNORMAL LOW (ref >60)
Glucose, Bld: 135 mg/dL — ABNORMAL HIGH (ref 70–99)
Phosphorus: 5.5 mg/dL — ABNORMAL HIGH (ref 2.5–4.6)
Potassium: 4.8 mmol/L (ref 3.5–5.1)
Sodium: 134 mmol/L — ABNORMAL LOW (ref 135–145)

## 2019-10-07 LAB — BASIC METABOLIC PANEL
Anion gap: 17 — ABNORMAL HIGH (ref 5–15)
BUN: 46 mg/dL — ABNORMAL HIGH (ref 8–23)
CO2: 20 mmol/L — ABNORMAL LOW (ref 22–32)
Calcium: 7.8 mg/dL — ABNORMAL LOW (ref 8.9–10.3)
Chloride: 96 mmol/L — ABNORMAL LOW (ref 98–111)
Creatinine, Ser: 4.16 mg/dL — ABNORMAL HIGH (ref 0.44–1.00)
GFR calc Af Amer: 11 mL/min — ABNORMAL LOW (ref >60)
GFR calc non Af Amer: 10 mL/min — ABNORMAL LOW (ref >60)
Glucose, Bld: 155 mg/dL — ABNORMAL HIGH (ref 70–99)
Potassium: 4.8 mmol/L (ref 3.5–5.1)
Sodium: 133 mmol/L — ABNORMAL LOW (ref 135–145)

## 2019-10-07 LAB — GLUCOSE, CAPILLARY
Glucose-Capillary: 127 mg/dL — ABNORMAL HIGH (ref 70–99)
Glucose-Capillary: 133 mg/dL — ABNORMAL HIGH (ref 70–99)
Glucose-Capillary: 101 mg/dL — ABNORMAL HIGH (ref 70–99)
Glucose-Capillary: 111 mg/dL — ABNORMAL HIGH (ref 70–99)

## 2019-10-07 LAB — MAGNESIUM: Magnesium: 2 mg/dL (ref 1.7–2.4)

## 2019-10-07 LAB — PROCALCITONIN: Procalcitonin: 2.15 ng/mL

## 2019-10-07 NOTE — Progress Notes (Addendum)
Advanced Heart Failure Rounding Note   Subjective:    Hypotensive overnight. Received IVF. NE off.   Remains confused. Cannot provide any history    Objective:   Weight Range:  Vital Signs:   Temp:  [96.6 F (35.9 C)-97.5 F (36.4 C)] 97.5 F (36.4 C) (12/07 0700) Pulse Rate:  [26-77] 68 (12/07 0800) Resp:  [12-23] 17 (12/07 0800) BP: (84-132)/(16-85) 108/70 (12/07 0800) SpO2:  [66 %-100 %] 97 % (12/07 0727) Weight:  [61.4 kg] 61.4 kg (12/07 0227) Last BM Date: 10/06/19  Weight change: Filed Weights   10/05/19 0500 10/06/19 0234 10/07/19 0227  Weight: 59.9 kg 60 kg 61.4 kg    Intake/Output:   Intake/Output Summary (Last 24 hours) at 10/07/2019 0836 Last data filed at 10/07/2019 0600 Gross per 24 hour  Intake 702.13 ml  Output -  Net 702.13 ml     Physical Exam: General:  Weak ill-appearing. No resp difficulty HEENT: normal Neck: supple. JVP to jaw . Carotids 2+ bilat; + bruits. No lymphadenopathy or thryomegaly appreciated. Cor: PMI nondisplaced. Regular rate & rhythm. 3/6 AS Lungs: clear Abdomen: soft, nontender, nondistended. No hepatosplenomegaly. No bruits or masses. Good bowel sounds. Extremities: no cyanosis, clubbing, rash, 1+ edema  LUE AVF Neuro: Confused  Telemetry: sinus 60-70s  Labs: Basic Metabolic Panel: Recent Labs  Lab 10/04/19 0207 10/04/19 1315 10/05/19 1238 10/06/19 0316 10/06/19 2336 10/07/19 0334  NA 141 142 137 134* 133* 134*  K 3.7 4.4 4.6 5.0 4.8 4.8  CL 102 102 98 97* 96* 96*  CO2 27 21* 21* 20* 20* 21*  GLUCOSE 128* 48* 87 141* 155* 135*  BUN 14 21 16  32* 46* 51*  CREATININE 3.79* 4.17* 2.83* 3.50* 4.16* 4.15*  CALCIUM 7.6* 8.4* 8.5* 8.2* 7.8* 7.7*  MG  --   --  1.8  --   --  2.0  PHOS 3.6 4.9* 4.5 5.4*  --  5.5*    Liver Function Tests: Recent Labs  Lab 10/03/19 1600 10/04/19 0207 10/04/19 1315 10/05/19 1238 10/06/19 0316 10/07/19 0334  AST 55*  --   --  80*  --  193*  ALT 26  --   --  38  --  92*   ALKPHOS 141*  --   --  152*  --  150*  BILITOT 0.9  --   --  1.5*  --  1.1  PROT 6.9  --   --  7.0  --  6.9  ALBUMIN 2.6* 2.3* 2.8* 2.8* 2.5* 2.5*  2.4*   Recent Labs  Lab 10/06/19 1013  LIPASE 49   No results for input(s): AMMONIA in the last 168 hours.  CBC: Recent Labs  Lab 10/03/19 1600  10/04/19 1315 10/05/19 0014 10/05/19 1053 10/06/19 0316 10/07/19 0334  WBC 2.6*   < > 6.2 6.3 6.4 8.6 7.6  NEUTROABS 1.9  --   --   --   --   --   --   HGB 9.1*   < > 10.2* 9.5* 10.2* 11.3* 10.5*  HCT 29.0*   < > 33.1* 30.5* 31.5* 35.6* 32.1*  MCV 105.1*   < > 106.4* 103.0* 102.9* 102.6* 99.4  PLT 62*   < > 65* 75* 84* 98* 81*   < > = values in this interval not displayed.    Cardiac Enzymes: Recent Labs  Lab 10/06/19 1013  CKTOTAL 153  CKMB 9.4*    BNP: BNP (last 3 results) No results for input(s): BNP in  the last 8760 hours.  ProBNP (last 3 results) No results for input(s): PROBNP in the last 8760 hours.    Other results:  Imaging:  No results found.   Medications:     Scheduled Medications: . Chlorhexidine Gluconate Cloth  6 each Topical Q0600  . cinacalcet  30 mg Oral Q supper  . darbepoetin (ARANESP) injection - DIALYSIS  100 mcg Intravenous Q Fri-HD  . doxercalciferol  1 mcg Intravenous Q M,W,F-HD  . midodrine  10 mg Oral TID WC  . multivitamin  1 tablet Oral QHS     Infusions: . sodium chloride Stopped (10/05/19 0224)  . ceFEPime (MAXIPIME) IV Stopped (10/06/19 1442)  . dextrose 20 mL/hr at 10/07/19 0300  . norepinephrine (LEVOPHED) Adult infusion Stopped (10/06/19 2301)     PRN Medications:  sodium chloride, acetaminophen **OR** acetaminophen, alteplase, calcium carbonate (dosed in mg elemental calcium), camphor-menthol **AND** hydrOXYzine, docusate sodium, feeding supplement (NEPRO CARB STEADY), haloperidol lactate, heparin, lidocaine (PF), lidocaine-prilocaine, ondansetron **OR** ondansetron (ZOFRAN) IV, pentafluoroprop-tetrafluoroeth,  sorbitol   Assessment/Plan:   1. Acute systolic HF with cardiogenic shock - Echo 12/6 with severe biventricular dysfunction with LVEF 15-20% severe RV dysfunction with RVSP ~75. Severe Tr, moderate MR and severe low gradient AS with VR = 0.30 - echo in 1/19 with EF 55-60% - Off NE. hypotensive overnight again. Likely combination of sepsis and profound cardiogenic shock - etiology of cardiomyopathy is unclear. ? Septic, ischemic or AS. Ideally would have cath but overall condition seems quite tenuous and Palliative/Hospice may be more appropriate - volume status elevated managed by HD but limited with hypotension  2. UTI/sepsis - being treated for UTI. Bcx 1/1 on 12/3 NGTD - COVID negative - PCT 1.88-> 2.15 - on vanc/cefepime per CCM  3. Severe AS - echo c/w low-gradient severe AS with dimensionless index 0.30 - not TAVR candidate currently due to dementia  4. ESRD - managed with HD. Renal following  5. Dementia - baseline unclear. Will need to d/w family  She has persistent shock. She has end-stage CM with severe AS. She is likely inotrope dependent. Volume up but unable to dialyze with hypotension. Not candidate for aggressive interventions due to dementia, ESRD and fraility. I see no other option except comfort care at this point. D/w Dr. Tamala Julian  CRITICAL CARE Performed by: Glori Bickers  Total critical care time: 35 minutes  Critical care time was exclusive of separately billable procedures and treating other patients.  Critical care was necessary to treat or prevent imminent or life-threatening deterioration.  Critical care was time spent personally by me (independent of midlevel providers or residents) on the following activities: development of treatment plan with patient and/or surrogate as well as nursing, discussions with consultants, evaluation of patient's response to treatment, examination of patient, obtaining history from patient or surrogate, ordering and  performing treatments and interventions, ordering and review of laboratory studies, ordering and review of radiographic studies, pulse oximetry and re-evaluation of patient's condition.    Length of Stay: 4   Glori Bickers MD 10/07/2019, 8:36 AM  Advanced Heart Failure Team Pager [phone] (M-F; [place])  Please contact [facility] for night-coverage after hours (4p -7a ) and weekends on [email or web address]

## 2019-10-07 NOTE — Progress Notes (Signed)
[facility] ROUNDING NOTE   Subjective:   78 year old lady with history of end-stage renal disease Monday Wednesday Friday dialysis secondary to diabetes.  She also has depression gastroesophageal reflux disease noncompliance and hypertension.  She was brought in by family due to altered mental status and complaints of pain left leg which is nonspecific.       Update Daily Seen in room, no distress, on HD, confused and lethargic   Objective:  Vital signs in last 24 hours:  Temp:  [96.6 F (35.9 C)-97.5 F (36.4 C)] 97.3 F (36.3 C) (12/07 1133) Pulse Rate:  [66-77] 70 (12/07 1200) Resp:  [10-23] 15 (12/07 1200) BP: (84-132)/(27-85) 115/65 (12/07 1200) SpO2:  [83 %-100 %] 100 % (12/07 1200) Weight:  [61.4 kg] 61.4 kg (12/07 0227)  Weight change: 1.4 kg Filed Weights   10/05/19 0500 10/06/19 0234 10/07/19 0227  Weight: 59.9 kg 60 kg 61.4 kg    Intake/Output: I/O last 3 completed shifts: In: 1174.8 [I.V.:1074.8; IV Piggyback:100] Out: -    Intake/Output this shift:  Total I/O In: 171.5 [I.V.:71.5; IV Piggyback:100] Out: 500 [Other:500]  Exam General: Chronically ill-appearing lady no obvious distress, confused and sedated Neck:  Supple; no masses or thyromegaly. JVP not elevated Lungs:  CTA bilat. No wheezes, crackles, or rhonchi. No acute distress. Heart:  Regular rate and rhythm; no murmurs, clicks, rubs,  or gallops. Abdomen:  Soft, nontender and nondistended Extremities   left AV graft no edema Neuro: NF, Ox 1   Basic Metabolic Panel: Recent Labs  Lab 10/04/19 0207 10/04/19 1315 10/05/19 1238 10/06/19 0316 10/06/19 2336 10/07/19 0334  NA 141 142 137 134* 133* 134*  K 3.7 4.4 4.6 5.0 4.8 4.8  CL 102 102 98 97* 96* 96*  CO2 27 21* 21* 20* 20* 21*  GLUCOSE 128* 48* 87 141* 155* 135*  BUN 14 21 16  32* 46* 51*  CREATININE 3.79* 4.17* 2.83* 3.50* 4.16* 4.15*  CALCIUM 7.6* 8.4* 8.5* 8.2* 7.8* 7.7*  MG  --   --  1.8  --   --  2.0  PHOS 3.6  4.9* 4.5 5.4*  --  5.5*    Liver Function Tests: Recent Labs  Lab 10/03/19 1600 10/04/19 0207 10/04/19 1315 10/05/19 1238 10/06/19 0316 10/07/19 0334  AST 55*  --   --  80*  --  193*  ALT 26  --   --  38  --  92*  ALKPHOS 141*  --   --  152*  --  150*  BILITOT 0.9  --   --  1.5*  --  1.1  PROT 6.9  --   --  7.0  --  6.9  ALBUMIN 2.6* 2.3* 2.8* 2.8* 2.5* 2.5*  2.4*   Recent Labs  Lab 10/06/19 1013  LIPASE 49   No results for input(s): AMMONIA in the last 168 hours.  CBC: Recent Labs  Lab 10/03/19 1600  10/04/19 1315 10/05/19 0014 10/05/19 1053 10/06/19 0316 10/07/19 0334  WBC 2.6*   < > 6.2 6.3 6.4 8.6 7.6  NEUTROABS 1.9  --   --   --   --   --   --   HGB 9.1*   < > 10.2* 9.5* 10.2* 11.3* 10.5*  HCT 29.0*   < > 33.1* 30.5* 31.5* 35.6* 32.1*  MCV 105.1*   < > 106.4* 103.0* 102.9* 102.6* 99.4  PLT 62*   < > 65* 75* 84* 98* 81*   < > = values  in this interval not displayed.    Cardiac Enzymes: Recent Labs  Lab 10/06/19 1013  CKTOTAL 153  CKMB 9.4*    BNP: Invalid input(s): POCBNP  CBG: Recent Labs  Lab 10/06/19 1542 10/06/19 1947 10/07/19 0759 10/07/19 1126 10/07/19 1623  GLUCAP 161* 131* 127* 133* 101*    Microbiology: Results for orders placed or performed during the hospital encounter of 10/03/19  Culture, blood (routine x 2)     Status: None (Preliminary result)   Collection Time: 10/03/19  8:54 PM   Specimen: BLOOD  Result Value Ref Range Status   Specimen Description BLOOD RIGHT ARM  Final   Special Requests   Final    BOTTLES DRAWN AEROBIC AND ANAEROBIC Blood Culture adequate volume   Culture   Final    NO GROWTH 4 DAYS Performed at [facility], [place] [address], [place], [place] [address]    Report Status PENDING  Incomplete  SARS CORONAVIRUS 2 (TAT 6-24 HRS) Nasopharyngeal Nasopharyngeal Swab     Status: None   Collection Time: 10/04/19 12:40 AM   Specimen: Nasopharyngeal Swab  Result Value Ref Range Status   SARS  Coronavirus 2 NEGATIVE NEGATIVE Final    Comment: (NOTE) SARS-CoV-2 target nucleic acids are NOT DETECTED. The SARS-CoV-2 RNA is generally detectable in upper and lower respiratory specimens during the acute phase of infection. Negative results do not preclude SARS-CoV-2 infection, do not rule out co-infections with other pathogens, and should not be used as the sole basis for treatment or other patient management decisions. Negative results must be combined with clinical observations, patient history, and epidemiological information. The expected result is Negative. Fact Sheet for Patients: SugarRoll.be Fact Sheet for Healthcare Providers: https://www.woods-mathews.com/ This test is not yet approved or cleared by the [place] FDA and  has been authorized for detection and/or diagnosis of SARS-CoV-2 by FDA under an Emergency Use Authorization (EUA). This EUA will remain  in effect (meaning this test can be used) for the duration of the COVID-19 declaration under Section 56 4(b)(1) of the Act, 21 U.S.C. section 360bbb-3(b)(1), unless the authorization is terminated or revoked sooner. Performed at [facility], [place] [address] [place], [place] [address]   MRSA PCR Screening     Status: None   Collection Time: 10/05/19  3:31 AM   Specimen: Nasopharyngeal  Result Value Ref Range Status   MRSA by PCR NEGATIVE NEGATIVE Final    Comment:        The GeneXpert MRSA Assay (FDA approved for NASAL specimens only), is one component of a comprehensive MRSA colonization surveillance program. It is not intended to diagnose MRSA infection nor to guide or monitor treatment for MRSA infections. Performed at [facility], [place] [address] [place], [place] [address]     Coagulation Studies: No results for input(s): LABPROT, INR in the last 72 hours.  Urinalysis: No results for input(s): COLORURINE, LABSPEC, PHURINE, GLUCOSEU, HGBUR,  BILIRUBINUR, KETONESUR, PROTEINUR, UROBILINOGEN, NITRITE, LEUKOCYTESUR in the last 72 hours.  Invalid input(s): APPERANCEUR    Imaging: No results found.   Medications:   . sodium chloride Stopped (10/05/19 0224)  . ceFEPime (MAXIPIME) IV Stopped (10/07/19 0947)  . dextrose 10 mL/hr at 10/07/19 1200  . norepinephrine (LEVOPHED) Adult infusion Stopped (10/06/19 2301)   . Chlorhexidine Gluconate Cloth  6 each Topical Q0600  . cinacalcet  30 mg Oral Q supper  . darbepoetin (ARANESP) injection - DIALYSIS  100 mcg Intravenous Q Fri-HD  . doxercalciferol  1 mcg Intravenous Q M,W,F-HD  . midodrine  10 mg Oral TID WC  . multivitamin  1 tablet Oral QHS   sodium chloride, acetaminophen **OR** acetaminophen, alteplase, calcium carbonate (dosed in mg elemental calcium), camphor-menthol **AND** hydrOXYzine, docusate sodium, feeding supplement (NEPRO CARB STEADY), haloperidol lactate, heparin, lidocaine (PF), lidocaine-prilocaine, ondansetron **OR** ondansetron (ZOFRAN) IV, pentafluoroprop-tetrafluoroeth, sorbitol  Assessment/ Plan:   ESRD- MWF HD. AV graft left upper extremity.  She underwent successful dialysis 10/04/2019. Plan next HD today.   ANEMIA-it appears that she has some thrombocytopenia.  Her white count and hemoglobin have improved.  MBD-calcium and phosphorus appears to be well controlled and continues doxercalciferol 1 mcg q. treatment  HTN/VOL-appears to be controlled with very minimal fluid gains  ACCESS-AV graft  Hypotension.  Broad-spectrum antibiotics started and Levophed initiated.  TSH 4.  Cortisol 60.7.  Hypothermic. No endocrine etiology.  Blood cultures neg 10/03/2019  Kelly Splinter, MD 10/07/2019, 4:30 PM

## 2019-10-07 NOTE — Plan of Care (Signed)
TRH to pickup from PCCM on 12/8.  See TRH communication for further details.

## 2019-10-07 NOTE — Consult Note (Signed)
Consultation Note Date: 10/07/2019   Patient Name: [name] [name]  DOB: 1941/05/09  MRN: [id number]  Age / Sex: 78 y.o., female  PCP: Carollee Herter, Alferd Apa, DO Referring Physician: Candee Furbish, MD  Reason for Consultation: Establishing goals of care and Psychosocial/spiritual support  HPI/Patient Profile: 78 y.o. female  admitted on 10/03/2019 with significant past medical history for end-stage renal disease on hemodialysis Mondays Wednesdays and Fridays/  For 6 years, diabetes, depression, GERD, hypertension.   Per family patient has had increased weakness and confusion over the past several weeks, and dialysis has been complicated by hypotension.  Family reports poor p.o. intake and weight loss.  According to Dr Bensimhon's note patient  has end-stage CM with severe AS. She is likely inotrope dependent. Volume up but unable to dialyze with hypotension. Not candidate for aggressive interventions due to dementia, ESRD and fraility. Hospice/ comfort approach is recommended.  Family face treatment options decisions, advanced directive decisions and anticipatory care needs.    Clinical Assessment and Goals of Care:   This NP Wadie Lessen reviewed medical records, received report from team, assessed the patient and then spoke the patient's daughter/[name] [name]   to discuss diagnosis, prognosis, GOC, EOL wishes, disposition and options.  Concept of Palliative Care was discussed  A  discussion was had today regarding advanced directives.  Concepts specific to code status, artifical feeding and hydration, continued IV antibiotics and rehospitalization was had.  The difference between a aggressive medical intervention path  and a palliative comfort care path for this patient at this time was had.  Values and goals of care important to patient and family were attempted to be elicited.  We discussed the  limitations of medical interventions to prolong quality of life when the body begins to fail to thrive. We discussed the interdependence of human body physiology.  The importance of cardiac function on the ability to tolerate the treatment of hemodialysis.  Raised question for contemplation of when a body cannot tolerate hemodialysis and the associated limited prognosis.  Created space and opportunity for daughter to explore her thoughts and feelings regarding her mother's current medical situation.  She was somewhat resistant   to engage in  conversation that limited aggressive  interventions.  Natural trajectory and expectations at EOL were discussed.  Questions and concerns addressed.   Family encouraged to call with questions or concerns.    This NP will f/u tomorrow with family and  continue to support holistically.    Patient's daughter [name] [name] tells me that she is the documented healthcare power of attorney.  We do not have any paperwork at this time, she is encouraged to bring that with her next time she visits for scanning into the chart. There is a spouse but according to daughter he is "not able"  to be the decision maker.      SUMMARY OF RECOMMENDATIONS    Code Status/Advance Care Planning:  Full code -  Encouraged family to consider DNR/DNI status knowing poor outcomes in similar  patients.  Family is open to all offered and available medical interventions to prolong life    Palliative Prophylaxis:   Aspiration, Bowel Regimen, Delirium Protocol, Frequent Pain Assessment and Oral Care  Additional Recommendations (Limitations, Scope, Preferences):  Full Scope Treatment  Psycho-social/Spiritual:   Desire for further Chaplaincy support:no  Additional Recommendations: Education on Hospice  Prognosis:   Unfortunately if patient is unable to tolerate dialysis, prognosis is less than two weeks  Discharge Planning: To Be Determined      Primary  Diagnoses: Present on Admission: . AMS (altered mental status) . Sepsis ([place]) . Type II diabetes mellitus with nephropathy ([place]) . Hyperlipidemia LDL goal <70 . Essential hypertension . GERD . ESRD (end stage renal disease) ([place]) . Acute lower UTI   I have reviewed the medical record, interviewed the patient and family, and examined the patient. The following aspects are pertinent.  Past Medical History:  Diagnosis Date  . DEPRESSION   . DIABETES MELLITUS, TYPE I   . ESRD (end stage renal disease) on dialysis ([place])    MWF HD  . GERD   . GLAUCOMA   . Headache(784.0)   . Hepatitis B carrier ([place])    05/2009: neg Hep C; Hep B: core pos, Surf neg; fatty liver US 8/10 - 7/13  . HYPERLIPIDEMIA   . HYPERTENSION   . Memory deficit   . OSTEOPENIA   . POSTMENOPAUSAL STATUS   . Pulmonary edema   . Unspecified vitamin D deficiency    Social History   Socioeconomic History  . Marital status: Married    Spouse name: Not on file  . Number of children: Not on file  . Years of education: Not on file  . Highest education level: Not on file  Occupational History  . Not on file  Social Needs  . Financial resource strain: Not on file  . Food insecurity    Worry: Not on file    Inability: Not on file  . Transportation needs    Medical: Not on file    Non-medical: Not on file  Tobacco Use  . Smoking status: Never Smoker  . Smokeless tobacco: Never Used  . Tobacco comment: Married x's 61yrs, 6 kids-scattered [employer]. Retired Consulting civil engineer  Substance and Sexual Activity  . Alcohol use: No  . Drug use: No  . Sexual activity: Not Currently    Birth control/protection: None  Lifestyle  . Physical activity    Days per week: Not on file    Minutes per session: Not on file  . Stress: Not on file  Relationships  . Social Herbalist on phone: Not on file    Gets together: Not on file    Attends religious service: Not on file    Active member of club or organization: Not  on file    Attends meetings of clubs or organizations: Not on file    Relationship status: Not on file  Other Topics Concern  . Not on file  Social History Narrative   03/06/19 Lives with daughter    Family History  Problem Relation Age of Onset  . Kidney disease Mother   . Coronary artery disease Other   . Diabetes Other   . Hypertension Other   . Arthritis Other   . Kidney disease Sister    Scheduled Meds: . Chlorhexidine Gluconate Cloth  6 each Topical Q0600  . cinacalcet  30 mg Oral Q supper  . darbepoetin (ARANESP) injection - DIALYSIS  100 mcg Intravenous Q Fri-HD  . doxercalciferol  1 mcg Intravenous Q M,W,F-HD  . midodrine  10 mg Oral TID WC  . multivitamin  1 tablet Oral QHS   Continuous Infusions: . sodium chloride Stopped (10/05/19 0224)  . ceFEPime (MAXIPIME) IV Stopped (10/07/19 0947)  . dextrose 10 mL/hr at 10/07/19 1200  . norepinephrine (LEVOPHED) Adult infusion Stopped (10/06/19 2301)   PRN Meds:.sodium chloride, acetaminophen **OR** acetaminophen, alteplase, calcium carbonate (dosed in mg elemental calcium), camphor-menthol **AND** hydrOXYzine, docusate sodium, feeding supplement (NEPRO CARB STEADY), haloperidol lactate, heparin, lidocaine (PF), lidocaine-prilocaine, ondansetron **OR** ondansetron (ZOFRAN) IV, pentafluoroprop-tetrafluoroeth, sorbitol Medications Prior to Admission:  Prior to Admission medications   Medication Sig Start Date End Date Taking? Authorizing Provider  acetaminophen (TYLENOL) 325 MG tablet Take 650 mg by mouth every 6 (six) hours as needed for mild pain.   Yes [provider]  carvedilol (COREG) 25 MG tablet Take 1 tablet (25 mg total) by mouth 2 (two) times daily with a meal. 07/04/19  Yes Ann Held, DO  cinacalcet (SENSIPAR) 30 MG tablet Take 30 mg by mouth daily.  06/05/19  Yes [provider]  folic acid-vitamin b complex-vitamin c-selenium-zinc (DIALYVITE) 3 MG TABS tablet Take 1 tablet by mouth daily.    Yes [provider]  furosemide (LASIX) 40 MG tablet TAKE 1 TABLET BY MOUTH EVERY DAY Patient taking differently: Take 40 mg by mouth daily.  06/04/19  Yes Roma Schanz R, DO  isosorbide mononitrate (IMDUR) 30 MG 24 hr tablet TAKE 1 TABLET (30 MG TOTAL) BY MOUTH 2 (TWO) TIMES DAILY. 09/30/19  Yes Roma Schanz R, DO  nitroGLYCERIN (NITROSTAT) 0.4 MG SL tablet Place 1 tablet (0.4 mg total) under the tongue every 5 (five) minutes as needed for chest pain. 0000000  Yes Delora Fuel, MD  sevelamer carbonate (RENVELA) 800 MG tablet Take 2 tablets (1,600 mg total) by mouth 3 (three) times daily with meals. Patient not taking: Reported on 06/10/2019 10/18/18   Kayleen Memos, DO   Allergies  Allergen Reactions  . Hydralazine Itching  . Robaxin [Methocarbamol] Other (See Comments)    Dizziness    Review of Systems  Unable to perform ROS: Acuity of condition    Physical Exam Constitutional:      Appearance: She is underweight. She is ill-appearing.  Cardiovascular:     Rate and Rhythm: Normal rate.  Skin:    General: Skin is warm and dry.  Psychiatric:        Cognition and Memory: Cognition is impaired.     Vital Signs: BP 115/65   Pulse 70   Temp (!) 97.3 F (36.3 C) (Oral)   Resp 15   Ht 5' 5.5" (1.664 m)   Wt 61.4 kg   SpO2 100%   BMI 22.18 kg/m  Pain Scale: 0-10   Pain Score: 0-No pain   SpO2: SpO2: 100 % O2 Device:SpO2: 100 % O2 Flow Rate: .O2 Flow Rate (L/min): 2 L/min  IO: Intake/output summary:   Intake/Output Summary (Last 24 hours) at 10/07/2019 1353 Last data filed at 10/07/2019 1200 Gross per 24 hour  Intake 893.59 ml  Output 500 ml  Net 393.59 ml    LBM: Last BM Date: 10/04/19 Baseline Weight: Weight: 59 kg Most recent weight: Weight: 61.4 kg     Palliative Assessment/Data:     Time In: 1250 Time Out: 1400 Time Total: 70 minutes Greater than 50%  of this time was spent counseling and coordinating  care related to the above  assessment and plan.  Signed by: Wadie Lessen, NP   Please contact Palliative Medicine Team phone at [phone] for questions and concerns.  For individual provider: See Shea Evans

## 2019-10-07 NOTE — Progress Notes (Signed)
NAME:  [name] [name], MRN:  [id number], DOB:  25-Feb-1941, LOS: 4 ADMISSION DATE:  10/03/2019, CONSULTATION DATE:  10/07/19  CHIEF COMPLAINT:  hypotension  BRIEF  78 y.o. F with PMH of Dementia, ESRD on MWF dialysis, Type 1 DM, HL, HTN who presented on 12/3 with confusion and weakness.  Family noted decreased oral intake and diaphoresis and chills.  She does make urine and had WBC's and bacteria, so was started on Ceftriaxone.  CT head was negative for acute findings.   Starting 12/4 pt's BP started down-trending, lactic acid 2.4.  She was started on NS 75cc/hr and overnight BP as low as 56/42.  She was given a total of 2L IVF boluses and transferred to the ICU.   On exam, pt is awake and alert, states we are in the wrong room and denies complaints.  Oriented only to person.   Past Medical History   has a past medical history of DEPRESSION, DIABETES MELLITUS, TYPE I, ESRD (end stage renal disease) on dialysis ([place]), GERD, GLAUCOMA, Headache(784.0), Hepatitis B carrier ([place]), HYPERLIPIDEMIA, HYPERTENSION, Memory deficit, OSTEOPENIA, POSTMENOPAUSAL STATUS, Pulmonary edema, and Unspecified vitamin D deficiency.   Significant Hospital Events   12/3 Admit to Hospitalists 12/5 - Pt denying lung ultrasound.  Repeat rounds. Not on vent. Awake and alert. And ORiented. Diastolic low - SBP > 123XX123 with MAP > 50/55. Refuses IV, CVL, blood draws.Says goal is to go home well and asap. Says blood test last week is sufficient. Seems to have capacity. On 63mcg levophd via one PIV.  Per RN s./p 1L HD volume removal yesterday   Consults:  PCCM Nephrology  Procedures:    Significant Diagnostic Tests:  12/5 CXR>> Echo>>  Micro Data:  UC>>never sent 12/3 BCx2>> 12/4 Sars-CoV-2>>negative  Antimicrobials:  Ceftriaxone 12/3-125 Cefepime 12/5- Vancomycin 12/5-  Interim history/subjective:  No events, not eating well.  Off pressors.  Objective   Blood pressure 111/75, pulse 72, temperature (!) 96.6  F (35.9 C), temperature source Axillary, resp. rate 13, height 5' 5.5" (1.664 m), weight 61.4 kg, SpO2 95 %.        Intake/Output Summary (Last 24 hours) at 10/07/2019 [id number] Last data filed at 10/07/2019 0600 Gross per 24 hour  Intake 702.13 ml  Output -  Net 702.13 ml   Filed Weights   10/05/19 0500 10/06/19 0234 10/07/19 0227  Weight: 59.9 kg 60 kg 61.4 kg      GEN: frail elderly woman sitting in bed HEENT: Chewing on food, not swallowing, trachea midline CV: RRR, +murmur, ext lukewarm PULM: Clear, no accessory muscle use GI: Soft, +BS EXT: Trace global anasarca, +muscle wasting NEURO: Moves all 4 ext briskly to command PSYCH: AO to self SKIN: No rashes, LUE fistula being accessed for HD today  CBGs okay CBC stable plts Pct 2.1  Resolved Hospital Problem list     Assessment & Plan:  # Multifactorial shock due to combination low output AS, nephrogenic vasoplegia, and ?sepsis from UTI (although oliguric) vs. aspiration vs. HCAP- resolved with present management # ESRD on HD # Dementia- appears advanced # Severe protein calorie malnutrition present on admission # Frail elderly- appears to be approaching end of life # Starvation ketosis # DM2 vs. 1 unclear, sugars here are fine, A1c 4%  - Unclear why patient not on TRH, will reach back out - Will call family and start [place] discussions - Continue midodrine - Cultures unrevealing, will narrow to cefepime x 7 days - Continue to encourage PO -  Patient is appropriate for hospice at any time but would need to stop HD  Erskine Emery MD PCCM

## 2019-10-07 NOTE — Progress Notes (Signed)
Spoke with daughter regarding patient's multiorgan dysfunction and non-operable critical AS.  She needs some time to process before making any decisions regarding where to go from here (hospice vs. SNF vs. Continued aggressive care).  She is agreeable to have palliative care speak with her and family regarding everything.  She does request Dr. Haroldine Laws reach out and discuss the heart issues with her.  Erskine Emery MD PCCM

## 2019-10-08 DIAGNOSIS — I5023 Acute on chronic systolic (congestive) heart failure: Secondary | ICD-10-CM

## 2019-10-08 DIAGNOSIS — I350 Nonrheumatic aortic (valve) stenosis: Secondary | ICD-10-CM

## 2019-10-08 DIAGNOSIS — E43 Unspecified severe protein-calorie malnutrition: Secondary | ICD-10-CM

## 2019-10-08 LAB — CULTURE, BLOOD (ROUTINE X 2)
Culture: NO GROWTH
Special Requests: ADEQUATE

## 2019-10-08 LAB — CBC
HCT: 31.8 % — ABNORMAL LOW (ref 36.0–46.0)
Hemoglobin: 10.7 g/dL — ABNORMAL LOW (ref 12.0–15.0)
MCH: 33.5 pg (ref 26.0–34.0)
MCHC: 33.6 g/dL (ref 30.0–36.0)
MCV: 99.7 fL (ref 80.0–100.0)
Platelets: 73 10*3/uL — ABNORMAL LOW (ref 150–400)
RBC: 3.19 MIL/uL — ABNORMAL LOW (ref 3.87–5.11)
RDW: 17.2 % — ABNORMAL HIGH (ref 11.5–15.5)
WBC: 6.3 10*3/uL (ref 4.0–10.5)
nRBC: 5.6 % — ABNORMAL HIGH (ref 0.0–0.2)

## 2019-10-08 LAB — RENAL FUNCTION PANEL
Albumin: 2.1 g/dL — ABNORMAL LOW (ref 3.5–5.0)
Anion gap: 13 (ref 5–15)
BUN: 43 mg/dL — ABNORMAL HIGH (ref 8–23)
CO2: 24 mmol/L (ref 22–32)
Calcium: 7.5 mg/dL — ABNORMAL LOW (ref 8.9–10.3)
Chloride: 98 mmol/L (ref 98–111)
Creatinine, Ser: 3.85 mg/dL — ABNORMAL HIGH (ref 0.44–1.00)
GFR calc Af Amer: 12 mL/min — ABNORMAL LOW (ref >60)
GFR calc non Af Amer: 11 mL/min — ABNORMAL LOW (ref >60)
Glucose, Bld: 108 mg/dL — ABNORMAL HIGH (ref 70–99)
Phosphorus: 4.4 mg/dL (ref 2.5–4.6)
Potassium: 4 mmol/L (ref 3.5–5.1)
Sodium: 135 mmol/L (ref 135–145)

## 2019-10-08 LAB — PATHOLOGIST SMEAR REVIEW

## 2019-10-08 LAB — GLUCOSE, CAPILLARY
Glucose-Capillary: 98 mg/dL (ref 70–99)
Glucose-Capillary: 94 mg/dL (ref 70–99)
Glucose-Capillary: 109 mg/dL — ABNORMAL HIGH (ref 70–99)
Glucose-Capillary: 103 mg/dL — ABNORMAL HIGH (ref 70–99)

## 2019-10-08 LAB — MAGNESIUM: Magnesium: 1.9 mg/dL (ref 1.7–2.4)

## 2019-10-08 MED ORDER — MAGNESIUM SULFATE 2 GM/50ML IV SOLN
2.0000 g | Freq: Once | INTRAVENOUS | Status: AC
  Administered 2019-10-08: 20:00:00 2 g via INTRAVENOUS
  Filled 2019-10-08: qty 50

## 2019-10-08 MED ORDER — HALOPERIDOL LACTATE 5 MG/ML IJ SOLN
1.0000 mg | Freq: Four times a day (QID) | INTRAMUSCULAR | Status: DC | PRN
  Administered 2019-10-08: 1 mg via INTRAVENOUS

## 2019-10-08 MED ORDER — EPINEPHRINE 1 MG/10ML IJ SOSY
0.0500 mg | PREFILLED_SYRINGE | Freq: Once | INTRAMUSCULAR | Status: AC
  Administered 2019-10-08: 0.05 mg via INTRAVENOUS

## 2019-10-08 MED ORDER — AMIODARONE HCL IN DEXTROSE 360-4.14 MG/200ML-% IV SOLN
60.0000 mg/h | INTRAVENOUS | Status: AC
  Administered 2019-10-08: 22:00:00 60 mg/h via INTRAVENOUS
  Filled 2019-10-08: qty 200

## 2019-10-08 MED ORDER — PHENYLEPHRINE HCL-NACL 10-0.9 MG/250ML-% IV SOLN
INTRAVENOUS | Status: AC
  Filled 2019-10-08: qty 250

## 2019-10-08 MED ORDER — CHLORHEXIDINE GLUCONATE CLOTH 2 % EX PADS
6.0000 | MEDICATED_PAD | Freq: Every day | CUTANEOUS | Status: DC
  Administered 2019-10-09 – 2019-10-11 (×3): 6 via TOPICAL

## 2019-10-08 MED ORDER — PHENYLEPHRINE HCL-NACL 10-0.9 MG/250ML-% IV SOLN
0.0000 ug/min | INTRAVENOUS | Status: DC
  Administered 2019-10-08 – 2019-10-09 (×2): 50 ug/min via INTRAVENOUS
  Administered 2019-10-09: 40 ug/min via INTRAVENOUS
  Administered 2019-10-09 (×3): 50 ug/min via INTRAVENOUS
  Filled 2019-10-08: qty 250
  Filled 2019-10-08: qty 500
  Filled 2019-10-08 (×4): qty 250

## 2019-10-08 MED ORDER — SENNOSIDES-DOCUSATE SODIUM 8.6-50 MG PO TABS: 2.0000 | ORAL_TABLET | Freq: Every evening | ORAL | Status: DC | PRN

## 2019-10-08 MED ORDER — POLYETHYLENE GLYCOL 3350 17 G PO PACK: 17.0000 g | PACK | Freq: Every day | ORAL | Status: DC | PRN

## 2019-10-08 MED ORDER — HALOPERIDOL LACTATE 5 MG/ML IJ SOLN
INTRAMUSCULAR | Status: AC
  Filled 2019-10-08: qty 1

## 2019-10-08 MED ORDER — METOPROLOL TARTRATE 5 MG/5ML IV SOLN
2.5000 mg | Freq: Once | INTRAVENOUS | Status: AC
  Administered 2019-10-08: 2.5 mg via INTRAVENOUS
  Filled 2019-10-08: qty 5

## 2019-10-08 MED ORDER — EPINEPHRINE 1 MG/10ML IJ SOSY
PREFILLED_SYRINGE | INTRAMUSCULAR | Status: AC
  Filled 2019-10-08: qty 10

## 2019-10-08 MED ORDER — MAGNESIUM SULFATE 2 GM/50ML IV SOLN: 2.0000 g | Freq: Once | INTRAVENOUS | Status: DC

## 2019-10-08 MED ORDER — AMIODARONE IV BOLUS ONLY 150 MG/100ML
150.0000 mg | Freq: Once | INTRAVENOUS | Status: AC
  Administered 2019-10-08: 150 mg via INTRAVENOUS
  Filled 2019-10-08: qty 100

## 2019-10-08 MED ORDER — IPRATROPIUM-ALBUTEROL 0.5-2.5 (3) MG/3ML IN SOLN: 3.0000 mL | RESPIRATORY_TRACT | Status: DC | PRN

## 2019-10-08 MED ORDER — OLANZAPINE 5 MG PO TBDP: 5.0000 mg | ORAL_TABLET | Freq: Once | ORAL | Status: DC

## 2019-10-08 MED ORDER — SODIUM CHLORIDE 0.9 % IV BOLUS: 500.0000 mL | Freq: Once | INTRAVENOUS | Status: DC

## 2019-10-08 MED ORDER — AMIODARONE HCL IN DEXTROSE 360-4.14 MG/200ML-% IV SOLN
30.0000 mg/h | INTRAVENOUS | Status: DC
  Administered 2019-10-09 (×3): 30 mg/h via INTRAVENOUS
  Filled 2019-10-08: qty 400
  Filled 2019-10-08 (×2): qty 200

## 2019-10-08 MED ORDER — SODIUM CHLORIDE 0.9 % IV BOLUS
500.0000 mL | Freq: Once | INTRAVENOUS | Status: AC
  Administered 2019-10-08: 500 mL via INTRAVENOUS

## 2019-10-08 MED ORDER — VASOPRESSIN 20 UNIT/ML IV SOLN
0.0300 [IU]/min | INTRAVENOUS | Status: DC
  Filled 2019-10-08: qty 2

## 2019-10-08 NOTE — Significant Event (Signed)
Called to patient bedside for evaluation. Patient noted to be in SVT vs A.Fib RVR. HR 180. BP reading systolic [id number]. Daughter at bedside and very tearful. Palliative Care Following as patient has end stage Heart Failure, ESRD, and Advanced Dementia.   Dicussed in detail goals of care with daughter. Agrees to DNR/DNI status at this time. However wants [place] to continue medical treatments.   Patient given 150 mg amiodarone bolus and 2 mg Magnesium. Heart Improved to 60s. However unable to obtain BP (after trying forearm, upper arm, and bilateral legs). Discussed this with daughter who stated that typically they are unable to obtain BP at home however dialysis usually can in leg. Dicussed options of next steps including aline placement. After lengthy conversation decided to keep NEO gtt at 27 with orders for no escalation. If patient survives through the night have encouraged family to consider transition to comfort care.   CCT: 62 minutes.   Hayden Pedro, AGACNP-BC [facility]  PCCM Pgr: [phone]

## 2019-10-08 NOTE — Progress Notes (Signed)
Continuing to monitor patient. Awaiting bed placement to ICU. Daughter at bedside

## 2019-10-08 NOTE — Progress Notes (Addendum)
This afternoon patient went back into SVT with HR sustaining >140s with SBP 80s.  She still remains confused, arousable and answers basic questions.  Given 500 cc of normal saline bolus, Lopressor 2.5 mg IV.  Given her multiple comorbidities, hemodynamic instability and poor prognosis-discussed with the daughter at bedside who currently wants to pursue full scope of treatment.  Spoke with critical care and heart failure team.  Patient appears to be in atrial flutter therefore amiodarone will be started.  Patient will be transferred to the ICU per critical care team and will be starting her on pressors for now.  Discussed with RN and patient's daughter at bedside.  Appreciate input from heart failure and critical care team.  Time Spent: 25 mins.   Gerlean Ren MD  [facility]

## 2019-10-08 NOTE — Progress Notes (Signed)
NAME:  [name] [name], MRN:  [id number], DOB:  June 14, 1941, LOS: 5 ADMISSION DATE:  10/03/2019, CONSULTATION DATE:  10/08/19  CHIEF COMPLAINT:  hypotension  BRIEF  78 y.o. F with PMH of Dementia, ESRD on MWF dialysis, Type 1 DM, HL, HTN who presented on 12/3 with confusion and weakness.  Family noted decreased oral intake and diaphoresis and chills.  She does make urine and had WBC's and bacteria, so was started on Ceftriaxone.  CT head was negative for acute findings.   Starting 12/4 pt's BP started down-trending, lactic acid 2.4.  She was started on NS 75cc/hr and overnight BP as low as 56/42.  She was given a total of 2L IVF boluses and transferred to the ICU.   On exam, pt is awake and alert, states we are in the wrong room and denies complaints.  Oriented only to person.   Past Medical History   has a past medical history of DEPRESSION, DIABETES MELLITUS, TYPE I, ESRD (end stage renal disease) on dialysis ([place]), GERD, GLAUCOMA, Headache(784.0), Hepatitis B carrier ([place]), HYPERLIPIDEMIA, HYPERTENSION, Memory deficit, OSTEOPENIA, POSTMENOPAUSAL STATUS, Pulmonary edema, and Unspecified vitamin D deficiency.   Significant Hospital Events   12/3 Admit to Hospitalists 12/5 - Pt denying lung ultrasound.  Repeat rounds. Not on vent. Awake and alert. And ORiented. Diastolic low - SBP > 123XX123 with MAP > 50/55. Refuses IV, CVL, blood draws.Says goal is to go home well and asap. Says blood test last week is sufficient. Seems to have capacity. On 83mcg levophd via one PIV.  Per RN s./p 1L HD volume removal yesterday 12/7-transferred to TRH/floor 12/8 PCCM reconsulted d/t tachycardia, confusion, hypotension    Consults:  PCCM Nephrology  Procedures:    Significant Diagnostic Tests:  12/5 CXR>> Echo>>  Micro Data:  UC>>never sent 12/3 BCx2>>NG x 5 days  12/4 Sars-CoV-2>>negative MRSA surveillance negative   Antimicrobials:  Ceftriaxone 12/3-125 Cefepime 12/5- Vancomycin 12/5 x 1 dose    Interim history/subjective:  Tachycardic, varies with ST/Alfutter to 150s. Confused, moaning. Borderline SBP with adequate MAPs. Hospitalist attempted metoprolol /s change in HR.   Objective   Blood pressure (!) 83/63, pulse (!) 159, temperature 97.6 F (36.4 C), temperature source Oral, resp. rate 18, height 5' 5.5" (1.664 m), weight 58.5 kg, SpO2 100 %.        Intake/Output Summary (Last 24 hours) at 10/08/2019 1701 Last data filed at 10/08/2019 1600 Gross per 24 hour  Intake 396.36 ml  Output -  Net 396.36 ml   Filed Weights   10/06/19 0234 10/07/19 0227 10/08/19 0450  Weight: 60 kg 61.4 kg 58.5 kg    General: Elderly, cachectic female, rocking back and forth on bed  HENT: sunken orbits, PERRL. Moist mucus membranes Neck: No JVD. Trachea midline.  CV: IRRR. S1S2.  5/6 M. No RG. +2 distal pulses Lungs: BBS present, clear, FNL, symmetrical ABD: +BS x4. SNT/ND. GU: No Foley EXT: trace global anasarca, global muscle wasting edema Skin: PWD. In tact. No rashes or lesions Neuro: A&O to self only, moaning, states she wants to go home   Labs and EKG for past 24 hours reviewed in [facility] Problem list     Assessment & Plan:  # Hypotension 2/2 ST/Aflutter in setting of BiV failure and critical AoS non-amenable to TAVR  # ESRD on HD # Dementia- appears advanced # Severe protein calorie malnutrition present on admission # Frail elderly- appears to be approaching end of life # Starvation ketosis #  DM2 vs. 1 unclear, sugars here are fine, A1c 4%  -2G Mag now -D/W Dr Haroldine Laws. Will try Amiodarone gtt and neo for BP support. Transfer back to ICU.  - Palliative care following. All disciplines recommending DNR/DNI Hospice but family reluctant. Wants to continue full code for now.  Francine Graven, MSN, AGACNP  [facility]

## 2019-10-08 NOTE — Progress Notes (Signed)
MEDICATION RELATED CONSULT NOTE - INITIAL   Pharmacy Consult for amiodarone drug interaction review Indication: aflutter   Assessment: Major interaction between amiodarone and haloperidol prolonging QTc. Patient's QTc has been consistently above 450 with SVT. Patient is using haloperidol once every 1-2 days. Discussed possibility of discontinuing haloperidol with MD. Can use olanzapine if needed. Dr. Reesa Chew agreed to stop haloperidol.   Benetta Spar, PharmD, BCPS, BCCP Clinical Pharmacist  Please check AMION for all [place] phone numbers After 10:00 PM, call [place] [phone]

## 2019-10-08 NOTE — Progress Notes (Addendum)
Advanced Heart Failure Rounding Note   Subjective:    Earlier this afternoon, back in SVT but reviewed by Dr Haroldine Laws and is A flutter with rates 130-140s. Agitated.  SBP in the 80s.  Given fluid bolus and 2.5 mg metoprolol IV.     Over the last 30 minutes opens eyes but does not follow commands.   Objective:   Weight Range:  Vital Signs:   Temp:  [97.4 F (36.3 C)-97.9 F (36.6 C)] 97.6 F (36.4 C) (12/08 0811) Pulse Rate:  [68-159] 159 (12/08 1425) Resp:  [14-18] 18 (12/08 1425) BP: (83-107)/(59-75) 83/63 (12/08 0811) SpO2:  [93 %-100 %] 100 % (12/08 1425) Weight:  [58.5 kg] 58.5 kg (12/08 0450) Last BM Date: 10/08/19  Weight change: Filed Weights   10/06/19 0234 10/07/19 0227 10/08/19 0450  Weight: 60 kg 61.4 kg 58.5 kg    Intake/Output:   Intake/Output Summary (Last 24 hours) at 10/08/2019 1526 Last data filed at 10/08/2019 0817 Gross per 24 hour  Intake 120 ml  Output -  Net 120 ml     Physical Exam: General:  Weak appearing . Opens eyes HEENT: normal Neck: supple. JVP ~10 . Carotids 2+ bilat; no bruits. No lymphadenopathy or thryomegaly appreciated. Cor: PMI nondisplaced. Regular rate & rhythm. No rubs, gallops. 3/6 AS. Lungs: clear Abdomen: soft, nontender, nondistended. No hepatosplenomegaly. No bruits or masses. Good bowel sounds. Extremities: no cyanosis, clubbing, rash, R and LLE 1+ edema. LUE AVF  Neuro: opens eyes. Not following commands.    Telemetry: SVT 130-140s  Labs: Basic Metabolic Panel: Recent Labs  Lab 10/04/19 1315 10/05/19 1238 10/06/19 0316 10/06/19 2336 10/07/19 0334 10/08/19 0248  NA 142 137 134* 133* 134* 135  K 4.4 4.6 5.0 4.8 4.8 4.0  CL 102 98 97* 96* 96* 98  CO2 21* 21* 20* 20* 21* 24  GLUCOSE 48* 87 141* 155* 135* 108*  BUN 21 16 32* 46* 51* 43*  CREATININE 4.17* 2.83* 3.50* 4.16* 4.15* 3.85*  CALCIUM 8.4* 8.5* 8.2* 7.8* 7.7* 7.5*  MG  --  1.8  --   --  2.0 1.9  PHOS 4.9* 4.5 5.4*  --  5.5* 4.4    Liver  Function Tests: Recent Labs  Lab 10/03/19 1600  10/04/19 1315 10/05/19 1238 10/06/19 0316 10/07/19 0334 10/08/19 0248  AST 55*  --   --  80*  --  193*  --   ALT 26  --   --  38  --  92*  --   ALKPHOS 141*  --   --  152*  --  150*  --   BILITOT 0.9  --   --  1.5*  --  1.1  --   PROT 6.9  --   --  7.0  --  6.9  --   ALBUMIN 2.6*   < > 2.8* 2.8* 2.5* 2.5*  2.4* 2.1*   < > = values in this interval not displayed.   Recent Labs  Lab 10/06/19 1013  LIPASE 49   No results for input(s): AMMONIA in the last 168 hours.  CBC: Recent Labs  Lab 10/03/19 1600  10/05/19 0014 10/05/19 1053 10/06/19 0316 10/07/19 0334 10/08/19 0248  WBC 2.6*   < > 6.3 6.4 8.6 7.6 6.3  NEUTROABS 1.9  --   --   --   --   --   --   HGB 9.1*   < > 9.5* 10.2* 11.3* 10.5* 10.7*  HCT 29.0*   < >  30.5* 31.5* 35.6* 32.1* 31.8*  MCV 105.1*   < > 103.0* 102.9* 102.6* 99.4 99.7  PLT 62*   < > 75* 84* 98* 81* 73*   < > = values in this interval not displayed.    Cardiac Enzymes: Recent Labs  Lab 10/06/19 1013  CKTOTAL 153  CKMB 9.4*    BNP: BNP (last 3 results) No results for input(s): BNP in the last 8760 hours.  ProBNP (last 3 results) No results for input(s): PROBNP in the last 8760 hours.    Other results:  Imaging: No results found.   Medications:     Scheduled Medications: . [START ON 10/09/2019] Chlorhexidine Gluconate Cloth  6 each Topical Q0600  . cinacalcet  30 mg Oral Q supper  . darbepoetin (ARANESP) injection - DIALYSIS  100 mcg Intravenous Q Fri-HD  . doxercalciferol  1 mcg Intravenous Q M,W,F-HD  . midodrine  10 mg Oral TID WC  . multivitamin  1 tablet Oral QHS    Infusions: . sodium chloride Stopped (10/05/19 0224)  . ceFEPime (MAXIPIME) IV 1 g (10/08/19 1148)  . dextrose 10 mL/hr at 10/07/19 1200    PRN Medications: sodium chloride, acetaminophen **OR** acetaminophen, calcium carbonate (dosed in mg elemental calcium), camphor-menthol **AND** hydrOXYzine, docusate  sodium, feeding supplement (NEPRO CARB STEADY), haloperidol lactate, ipratropium-albuterol, ondansetron **OR** ondansetron (ZOFRAN) IV, polyethylene glycol, senna-docusate, sorbitol   Assessment/Plan:   1. Acute systolic HF with cardiogenic shock - Echo 12/6 with severe biventricular dysfunction with LVEF 15-20% severe RV dysfunction with RVSP ~75. Severe Tr, moderate MR and severe low gradient AS with VR = 0.30 - echo in 1/19 with EF 55-60%  Likely combination of sepsis and profound cardiogenic shock - etiology of cardiomyopathy is unclear. ? Septic, ischemic or AS. Ideally would have cath but overall condition seems quite tenuous and Palliative/Hospice may be more appropriate - Hypotensive. Unsure she swallowed afternoon midodrine.Receiving fluid bolus.  - volume status elevated managed by HD but limited with hypotension  2. UTI/sepsis - being treated for UTI. Bcx 1/1 on 12/3 NGTD - COVID negative - PCT 1.88-> 2.15 - on vanc/cefepime per CCM  3. Severe AS - echo c/w low-gradient severe AS with dimensionless index 0.30 - not TAVR candidate currently due to dementia  4. ESRD - managed with HD. Renal following  5. Dementia - baseline unclear.  6. A flutter Received 2.5 mg metoprolol.  Start amio drip.   Palliative Care following. Daughter at bedside and requesting full code.      Length of Stay: [place] NP-C  10/08/2019, 3:26 PM  Advanced Heart Failure Team Pager [phone] (M-F; 7a - 4p)  Please contact [facility] for night-coverage after hours (4p -7a ) and weekends on [email or web address]   Difficult situation.  Elderly patient with ESRD and advanced dementia. Now with severe biventricular dysfunction and severe low-gradient AS. Now with AFL with RVR and hypotension.  Patient is awake and alert but saying she wants to go home and not able to engage in meaningful discussion about her situation. Daughter at bedside saying she wants everything done.   On exam   Elderly cachetic. Confused. Wanting to go home HEENT: normal Neck: supple. JVP 9-10 Carotids 2+ bilat; no bruits. No lymphadenopathy or thryomegaly appreciated. Cor: PMI nondisplaced. Regular tachy +s3 +AS. Lungs: clear Abdomen: soft, nontender, nondistended. No hepatosplenomegaly. No bruits or masses. Good bowel sounds. Extremities: cachetic no cyanosis, clubbing, rash, edema Neuro: alert & confuses  She is critically ill in  setting of severe HF, AS and now AFL with RVR. I tried to explain to her daughter that the only really option here was comfort care and that she was not a candidate for further aggressive care, She is not ready to switch to comfort care and wants aggressive measures, I think this is futile but will give trial of IV amio. I suspect she will develop progressive hypotension and need ICU level care for support with neo. Would continue conversations regarding comfort care as it is clear that Ms. Gierke is in distress and our measures are not able to relieve this.   D/w CCM and TRH.   CRITICAL CARE Performed by: Glori Bickers  Total critical care time: 35 minutes  Critical care time was exclusive of separately billable procedures and treating other patients.  Critical care was necessary to treat or prevent imminent or life-threatening deterioration.  Critical care was time spent personally by me (independent of midlevel providers or residents) on the following activities: development of treatment plan with patient and/or surrogate as well as nursing, discussions with consultants, evaluation of patient's response to treatment, examination of patient, obtaining history from patient or surrogate, ordering and performing treatments and interventions, ordering and review of laboratory studies, ordering and review of radiographic studies, pulse oximetry and re-evaluation of patient's condition.  Glori Bickers, MD  10:26 PM

## 2019-10-08 NOTE — Care Management Important Message (Signed)
Important Message  Patient Details  Name: [name] [name] MRN: [id number] Date of Birth: Aug 06, 1941   Medicare Important Message Given:  Yes     Orbie Pyo 10/08/2019, 2:59 PM

## 2019-10-08 NOTE — Consult Note (Signed)
   [facility] Inpatient Consult   10/08/2019  NYLLA BUBIER [birth year] [id number]  Patient screened for extremehigh risk score for unplanned readmission score of 33%.  Progress notes reviewed for hospitalizations and to check if potential [place] [facility] services needed in the [place].  Review of patient's medical record reveals patient is being consulted by palliative care for goals of care.  Primary Care Provider is Roma Schanz, MD [place] and this office is listed to provide the transition of care.  Plan:  Follow up with inpatient [facility] team for disposition needs, if appropriate.  Will follow palliative care notes for progress.  1:00 pm Spoke with Tammy from [facility] and this patient is active with them in the [place] based Palliative program, will make inpatient [facility] team aware.  For questions contact:   Natividad Brood, RN BSN [facility] Liaison  [phone] business mobile phone Toll free office [phone]  Fax number: [phone] [email or web address]

## 2019-10-08 NOTE — Progress Notes (Signed)
[name] [name]   Subjective:   78 year old lady with history of end-stage renal disease Monday Wednesday Friday dialysis secondary to diabetes.  She also has depression gastroesophageal reflux disease noncompliance and hypertension.  She was brought in by family due to altered mental status and complaints of pain left leg which is nonspecific.       Update Daily Seen in room, no distress, on HD, confused and lethargic   Objective:  Vital signs in last 24 hours:  Temp:  [97.4 F (36.3 C)-97.9 F (36.6 C)] 97.6 F (36.4 C) (12/08 0811) Pulse Rate:  [68-74] 72 (12/08 0811) Resp:  [14-18] 17 (12/08 0811) BP: (83-107)/(47-75) 83/63 (12/08 0811) SpO2:  [93 %-100 %] 96 % (12/08 0811) Weight:  [58.5 kg] 58.5 kg (12/08 0450)  Weight change: -2.931 kg Filed Weights   10/06/19 0234 10/07/19 0227 10/08/19 0450  Weight: 60 kg 61.4 kg 58.5 kg    Intake/Output: I/O last 3 completed shifts: In: 475.8 [I.V.:375.8; IV Piggyback:100] Out: 500 [Other:500]   Intake/Output this shift:  Total I/O In: 120 [P.O.:120] Out: -   Exam General: Chronically ill-appearing lady no obvious distress, confused and sedated Neck:  Supple; no masses or thyromegaly. JVP not elevated Lungs:  CTA bilat. No wheezes, crackles, or rhonchi. No acute distress. Heart:  Regular rate and rhythm; no murmurs, clicks, rubs,  or gallops. Abdomen:  Soft, nontender and nondistended Extremities   left AV graft no edema Neuro: NF, Ox 1   Basic Metabolic Panel: Recent Labs  Lab 10/04/19 1315 10/05/19 1238 10/06/19 0316 10/06/19 2336 10/07/19 0334 10/08/19 0248  NA 142 137 134* 133* 134* 135  K 4.4 4.6 5.0 4.8 4.8 4.0  CL 102 98 97* 96* 96* 98  CO2 21* 21* 20* 20* 21* 24  GLUCOSE 48* 87 141* 155* 135* 108*  BUN 21 16 32* 46* 51* 43*  CREATININE 4.17* 2.83* 3.50* 4.16* 4.15* 3.85*  CALCIUM 8.4* 8.5* 8.2* 7.8* 7.7* 7.5*  MG  --  1.8  --   --  2.0 1.9  PHOS 4.9* 4.5 5.4*  --  5.5* 4.4     Liver Function Tests: Recent Labs  Lab 10/03/19 1600  10/04/19 1315 10/05/19 1238 10/06/19 0316 10/07/19 0334 10/08/19 0248  AST 55*  --   --  80*  --  193*  --   ALT 26  --   --  38  --  92*  --   ALKPHOS 141*  --   --  152*  --  150*  --   BILITOT 0.9  --   --  1.5*  --  1.1  --   PROT 6.9  --   --  7.0  --  6.9  --   ALBUMIN 2.6*   < > 2.8* 2.8* 2.5* 2.5*  2.4* 2.1*   < > = values in this interval not displayed.   Recent Labs  Lab 10/06/19 1013  LIPASE 49   No results for input(s): AMMONIA in the last 168 hours.  CBC: Recent Labs  Lab 10/03/19 1600  10/05/19 0014 10/05/19 1053 10/06/19 0316 10/07/19 0334 10/08/19 0248  WBC 2.6*   < > 6.3 6.4 8.6 7.6 6.3  NEUTROABS 1.9  --   --   --   --   --   --   HGB 9.1*   < > 9.5* 10.2* 11.3* 10.5* 10.7*  HCT 29.0*   < > 30.5* 31.5* 35.6* 32.1* 31.8*  MCV 105.1*   < >  103.0* 102.9* 102.6* 99.4 99.7  PLT 62*   < > 75* 84* 98* 81* 73*   < > = values in this interval not displayed.      Medications:   . sodium chloride Stopped (10/05/19 0224)  . ceFEPime (MAXIPIME) IV 1 g (10/08/19 1148)  . dextrose 10 mL/hr at 10/07/19 1200  . norepinephrine (LEVOPHED) Adult infusion Stopped (10/06/19 2301)   . Chlorhexidine Gluconate Cloth  6 each Topical Q0600  . cinacalcet  30 mg Oral Q supper  . darbepoetin (ARANESP) injection - DIALYSIS  100 mcg Intravenous Q Fri-HD  . doxercalciferol  1 mcg Intravenous Q M,W,F-HD  . midodrine  10 mg Oral TID WC  . multivitamin  1 tablet Oral QHS   sodium chloride, acetaminophen **OR** acetaminophen, alteplase, calcium carbonate (dosed in mg elemental calcium), camphor-menthol **AND** hydrOXYzine, docusate sodium, feeding supplement (NEPRO CARB STEADY), haloperidol lactate, heparin, ipratropium-albuterol, lidocaine (PF), lidocaine-prilocaine, ondansetron **OR** ondansetron (ZOFRAN) IV, pentafluoroprop-tetrafluoroeth, polyethylene glycol, senna-docusate, sorbitol    Dialysis: [facility]    3.5h   52.5kg  2/2.25 bath  400/800   L AVG  Hep none  -mircera 100 q 2, last ?  - hect 1 ug    CXR 12/5 - mod R effusion, no pulm edema  Assessment/ Plan:   ESRD- MWF HD. HD tomorrow as tolerated.   ANEMIA-it appears that she has some thrombocytopenia.  Her white count and hemoglobin have improved.  MBD-calcium and phosphorus appears to be well controlled and continues doxercalciferol 1 mcg q. treatment  HTN/VOL-appears to be controlled with very minimal fluid gains. Wt's may not be accurate, no edema on exam.   ACCESS-AV graft  Hypotension - severe CHF/ CM w/ bivent HF and severe AS, per cardiology not candidate for intervention and prognosis is extremely poor.  Pall care consulting.   [name] Splinter, MD 10/08/2019, 12:03 PM

## 2019-10-08 NOTE — Evaluation (Signed)
Clinical/Bedside Swallow Evaluation Patient Details  Name: [name] [name] MRN: [id number] Date of Birth: Sep 09, 1941  Today's Date: 10/08/2019 Time: SLP Start Time (ACUTE ONLY): 1001 SLP Stop Time (ACUTE ONLY): 1010 SLP Time Calculation (min) (ACUTE ONLY): 9 min  Past Medical History:  Past Medical History:  Diagnosis Date  . DEPRESSION   . DIABETES MELLITUS, TYPE I   . ESRD (end stage renal disease) on dialysis ([place])    MWF HD  . GERD   . GLAUCOMA   . Headache(784.0)   . Hepatitis B carrier ([place])    05/2009: neg Hep C; Hep B: core pos, Surf neg; fatty liver US 8/10 - 7/13  . HYPERLIPIDEMIA   . HYPERTENSION   . Memory deficit   . OSTEOPENIA   . POSTMENOPAUSAL STATUS   . Pulmonary edema   . Unspecified vitamin D deficiency    Past Surgical History:  Past Surgical History:  Procedure Laterality Date  . A/V SHUNTOGRAM N/A 09/06/2018   Procedure: A/V SHUNTOGRAM - Left AV;  Surgeon: Marty Heck, MD;  Location: [facility];  Service: Cardiovascular;  Laterality: N/A;  . A/V SHUNTOGRAM Left 06/13/2019   Procedure: A/V SHUNTOGRAM;  Surgeon: Marty Heck, MD;  Location: [facility];  Service: Cardiovascular;  Laterality: Left;  . AV FISTULA PLACEMENT Left 05/29/2014   Procedure: INSERTION OF ARTERIOVENOUS (AV) GORE-TEX GRAFT ARM-LEFT;  Surgeon: Angelia Mould, MD;  Location: [place];  Service: Vascular;  Laterality: Left;  . CHOLECYSTECTOMY  02/12/07  . INSERTION OF DIALYSIS CATHETER Right 05/27/2014   Procedure: INSERTION OF DIALYSIS CATHETER;  Surgeon: Angelia Mould, MD;  Location: [facility];  Service: Vascular;  Laterality: Right;  . IR DIALY SHUNT INTRO [place] W/IMG LEFT Left 12/14/2018  . PERIPHERAL VASCULAR BALLOON ANGIOPLASTY Left 09/06/2018   Procedure: PERIPHERAL VASCULAR BALLOON ANGIOPLASTY;  Surgeon: Marty Heck, MD;  Location: [facility];  Service: Cardiovascular;  Laterality: Left;  arm shunt  .  PERIPHERAL VASCULAR BALLOON ANGIOPLASTY  06/13/2019   Procedure: PERIPHERAL VASCULAR BALLOON ANGIOPLASTY;  Surgeon: Marty Heck, MD;  Location: [facility];  Service: Cardiovascular;;  . REFRACTIVE SURGERY  07/29/09   Dr. Bing Plume  . TONSILLECTOMY     HPI:  78 y.o. with PMH of Dementia, GERD, memory deficit, esophageal dysphagia (see below), ESRD on MWF dialysis, Type 1 DM, HL, HTN who presented on 12/3 with confusion and weakness. Per chart family noted decreased oral intake, diaphoresis and chills. CT head was negative for acute findings. Found to have multifactorial shock due to nephrogenic, vasoplegia, and ? sepsis from UTI vs aspiration vs HCAP. Barium esophagram 07/04/19 revealed mild-to-moderate esophageal dysmotility with a presbyesophagus pattern, small to moderate pulsion diverticulum in the lower left thoracic esophagus just above the esophagogastric junction. Palliative on board. CXR 12/5 no significant change in moderate RIGHT pleural effusion with cardiomegaly.   Assessment / Plan / Recommendation Clinical Impression  Pt exhibits an oral based dysphagia marked by oral holding, decreased manipulation/mastication and oral residue. RN reported difficulty managing eggs with breakfast. SLP noted lingual residue of what appeared to be grits (min). No s/s aspiration with any consistency and RN did not observe with breakfast as well. She independently checked for buccal cavity residue after cracker. Upper and lower denture plate. SLP will downgrade to Dys 2, continue thin, pills with thin and check oral cavity for pocketed food. ST will follow up to check for effectiveness with texture downgrade.  SLP Visit Diagnosis: Dysphagia, oral phase (R13.11)    Aspiration Risk  Mild aspiration risk    Diet Recommendation Dysphagia 2 (Fine chop);Thin liquid   Liquid Administration via: Straw;Cup Medication Administration: Whole meds with liquid Supervision: Patient able to self feed;Full  supervision/cueing for compensatory strategies Compensations: Slow rate;Small sips/bites;Lingual sweep for clearance of pocketing Postural Changes: Seated upright at 90 degrees    Other  Recommendations Oral Care Recommendations: Oral care BID   Follow up Recommendations None      Frequency and Duration min 1 x/week  1 week       Prognosis Prognosis for Safe Diet Advancement: (fair-good) Barriers to Reach Goals: Cognitive deficits      Swallow Study   General HPI: 78 y.o. with PMH of Dementia, GERD, memory deficit, esophageal dysphagia (see below), ESRD on MWF dialysis, Type 1 DM, HL, HTN who presented on 12/3 with confusion and weakness. Per chart family noted decreased oral intake, diaphoresis and chills. CT head was negative for acute findings. Found to have multifactorial shock due to nephrogenic, vasoplegia, and ? sepsis from UTI vs aspiration vs HCAP. Barium esophagram 07/04/19 revealed mild-to-moderate esophageal dysmotility with a presbyesophagus pattern, small to moderate pulsion diverticulum in the lower left thoracic esophagus just above the esophagogastric junction. Palliative on board. CXR 12/5 no significant change in moderate RIGHT pleural effusion with cardiomegaly. Type of Study: Bedside Swallow Evaluation Previous Swallow Assessment: (none found) Diet Prior to this Study: Other (Comment);Thin liquids(soft) Temperature Spikes Noted: No Respiratory Status: Room air History of Recent Intubation: No Behavior/Cognition: Lethargic/Drowsy;Cooperative;Requires cueing Oral Cavity Assessment: Other (comment)(lingual residue with grits) Oral Care Completed by SLP: Yes Oral Cavity - Dentition: Dentures, top;Dentures, bottom Vision: Functional for self-feeding Self-Feeding Abilities: Total assist Patient Positioning: Upright in bed Baseline Vocal Quality: Normal Volitional Cough: Cognitively unable to elicit    Oral/Motor/Sensory Function Overall Oral Motor/Sensory Function:  Within functional limits   Ice Chips Ice chips: Not tested   Thin Liquid Thin Liquid: Within functional limits Presentation: Cup;Straw    Nectar Thick Nectar Thick Liquid: Not tested   Honey Thick Honey Thick Liquid: Not tested   Puree Puree: Within functional limits   Solid     Solid: Within functional limits      Houston Siren 10/08/2019,10:36 AM   Orbie Pyo Colvin Caroli.Ed Risk analyst [phone] Office [phone]

## 2019-10-08 NOTE — Progress Notes (Signed)
Patient ID: [name] [name], female   DOB: 12/17/1940, 78 y.o.   MRN: [id number]  This NP visited patient at the bedside as a follow up to  yesterday's [place].  Patient is confused and agitated, nursing medicating with Haldol.  She is unable to follow commands, and is perseverating on "sit me up and I can't see".   It seem as if patient cannot see my face or hands . Left eye with lateral gaze. Placed call to daughter and she is on her way to the room.  Meet daughter at bedside for continued conversation regarding current medical situation, treatment option decisions, advanced directive decisions and anticipatory care needs.  Again we discussed input from cardiology suggesting comfort approach 2/2 to her significant cardiac history ;  Systolic HF/ EF 123456 %, severe AS, inability to dialyze due to hypotension.  Discussed poor prognosis in situation of inability to tolerate ongoing dialysis.    Daughter tells me she understands the seriousness of the situation (both she and her sister have some medical  background); however at this time she is unable/unwilling to make any changes to treatment plan until she speaks with her sister.  Plan is to meet again tomorrow at 2:00 pm with [name].  Strongly encourage her to consider documentation of DNR/DNI status knowing poor outcomes in similar patients. Patient is high risk for decompensation.  She will "talk to her ssiter tonight"   Discussed and offered education on OP palliative services vs Hospice benefit.   We discussed home hospice vs residential hospice.   Hard Choices booklet left for review  Questions and concerns addressed   Discussed with Dr Reesa Chew  Total time spent on the unit was 40 minutes  Greater than 50% of the time was spent in counseling and coordination of care  Wadie Lessen NP  Palliative Medicine Team Team Phone # [phone] Pager [phone]

## 2019-10-08 NOTE — Progress Notes (Signed)
1845: Report called to [facility], RN-4N 19. Transported and accepted by 4N team at 1900.

## 2019-10-08 NOTE — Progress Notes (Signed)
1330: patient becoming more agitated. Yelling wishing to go home. Patient alert only to self. Frequent re-orientation unsustainable. Patient HR accelerated 158; SBP 80/64; R 25; O2 >95% RA. 1mg  haldol given. MD paged. New orders given. Will continue to monitor.

## 2019-10-08 NOTE — Progress Notes (Signed)
PROGRESS NOTE    [name] [name]  [id number] DOB: [birth year] DOA: 10/03/2019 PCP: Ann Held, DO   Brief Narrative:  78 year old with history of ESRD MWF, DM 2, HLD, GERD, depression, diastolic CHF EF 55 to 78%, noncompliance was brought to the hospital for evaluation of left lower extremity pain and change in mental status.  She was found to have urinary tract infection started on Rocephin, later switched to vancomycin and cefepime.  CT of the head was negative.  She became acutely hypotensive with significantly elevated lactate.  IV fluids and Levophed were started.  Echocardiogram showed ejection fraction 15-20%, severe RV dysfunction with RVSP at 75, severe TR, moderate MR and severe aortic stenosis with low gradient.   Assessment & Plan:   Principal Problem:   AMS (altered mental status) Active Problems:   Type II diabetes mellitus with nephropathy (HCC)   Hyperlipidemia LDL goal <70   Essential hypertension   GERD   ESRD (end stage renal disease) (HCC)   Severe sepsis with septic shock (HCC)   Acute lower UTI   End stage renal disease on dialysis ([place])   Cardiogenic shock ([place])   Palliative care by specialist   DNR (do not resuscitate) discussion   Adult failure to thrive   Severe aortic stenosis  Septic shock with hypotension, improved Urinary tract infection Community-acquired pneumonia with right-sided pleural effusion -Off pressors now initially required Levophed -On cefepime/vancomycin -Urine strep, urine Legionella  Severe lactic acidosis -Improved with fluid  Cardiogenic shock with acute systolic congestive heart failure, ejection fraction 15-20% Biventricular failure with severe aortic stenosis, low gradient -New onset cardiomyopathy.  Echo in 2019 was EF 55 to 60% -Heart failure team following. -Symptomatic management, not a candidate for TAVR -Continue midodrine  Diabetes mellitus type 2, insulin-dependent -Insulin sliding scale and  Accu-Chek  ESRD on hemodialysis Monday Wednesday Friday -Nephrology team following.  AV graft left upper extremity.  Will clarify with nephrology team regarding long-term plans for dialysis especially in the setting of low blood pressure.  Dementia with episodes of delirium -Supportive management.  As needed Haldol -CT head negative  Thrombocytopenia -Acute on chronic especially in the setting of sepsis.  Around 7 days  Anemia of chronic disease -No obvious signs of bleeding.  Hemoglobin 10  Severe protein calorie malnutrition -We will consult dietitian  Goals of care discussion -Palliative care team met with the family given multiple comorbidities including cardiogenic shock, severe AS, biventricular failure, ESRD, ongoing sepsis and dementia.  It appears at this point family wants to pursue aggressive management.  Unfortunately patient has poor prognosis given multiple comorbidities with limited medical options.  DVT prophylaxis: SCDs Code Status: Full code Family Communication:  Lucrezia Europe, spoke with her regarding poor prognosis and her goals of care. Disposition Plan: Maintain hospital stay to manage her multiple comorbid conditions.  Blood pressure still low.  Consultants:   Nephrology  Heart failure   Subjective: Patient is awake and answers very basic questions.  She does squeeze my finger and follow very basic commands.  Poor oral intake.  Continues to pull food in her mouth.  Continues to have soft blood pressures  Spoke extensively with patient's daughter regarding goals of care.  I explained her that she has multiple terminal comorbid conditions.  Due to her low blood pressure, dialysis is very challenging.  Low blood pressure is also combination of underlying infection, cardiomyopathy and valvular dysfunction.  Options are very limited at this time in terms of her  care.  I explained her that at this time we should try and focus on making her more comfortable  which the daughter understands.  At this time she would like to think about this.  Review of Systems Otherwise negative except as per HPI, including: General: Denies fever, chills, night sweats or unintended weight loss. Resp: Denies cough, wheezing, shortness of breath. Cardiac: Denies chest pain, palpitations, orthopnea, paroxysmal nocturnal dyspnea. GI: Denies abdominal pain, nausea, vomiting, diarrhea or constipation GU: Denies dysuria, frequency, hesitancy or incontinence MS: Denies muscle aches, joint pain or swelling Neuro: Denies headache, neurologic deficits (focal weakness, numbness, tingling), abnormal gait Psych: Denies anxiety, depression, SI/HI/AVH Skin: Denies new rashes or lesions ID: Denies sick contacts, exotic exposures, travel  Objective: Vitals:   10/07/19 2133 10/07/19 2322 10/08/19 0320 10/08/19 0450  BP: (!) 91/59 107/75 92/65   Pulse: 68 74 73   Resp: 18 17 18    Temp: 97.7 F (36.5 C) 97.6 F (36.4 C) 97.9 F (36.6 C)   TempSrc: Oral Oral Oral   SpO2: 98% 98% 93%   Weight:    58.5 kg  Height:        Intake/Output Summary (Last 24 hours) at 10/08/2019 0753 Last data filed at 10/07/2019 1200 Gross per 24 hour  Intake 171.49 ml  Output 500 ml  Net -328.51 ml   Filed Weights   10/06/19 0234 10/07/19 0227 10/08/19 0450  Weight: 60 kg 61.4 kg 58.5 kg    Examination:  General exam: Appears chronically ill and frail.,  Bilateral temporal wasting Respiratory system: Minimal bibasilar crackles Cardiovascular system: Normal sinus rhythm Gastrointestinal system: Abdomen is nondistended, soft and nontender. No organomegaly or masses felt. Normal bowel sounds heard. Central nervous system: Alert to her name.  Exam is nonfocal. Extremities: Symmetric 4 x 5 power. Skin: No rashes, lesions or ulcers Psychiatry: Poor judgment and insight, follows very basic commands   Data Reviewed:   CBC: Recent Labs  Lab 10/03/19 1600  10/05/19 0014 10/05/19 1053  10/06/19 0316 10/07/19 0334 10/08/19 0248  WBC 2.6*   < > 6.3 6.4 8.6 7.6 6.3  NEUTROABS 1.9  --   --   --   --   --   --   HGB 9.1*   < > 9.5* 10.2* 11.3* 10.5* 10.7*  HCT 29.0*   < > 30.5* 31.5* 35.6* 32.1* 31.8*  MCV 105.1*   < > 103.0* 102.9* 102.6* 99.4 99.7  PLT 62*   < > 75* 84* 98* 81* 73*   < > = values in this interval not displayed.   Basic Metabolic Panel: Recent Labs  Lab 10/04/19 1315 10/05/19 1238 10/06/19 0316 10/06/19 2336 10/07/19 0334 10/08/19 0248  NA 142 137 134* 133* 134* 135  K 4.4 4.6 5.0 4.8 4.8 4.0  CL 102 98 97* 96* 96* 98  CO2 21* 21* 20* 20* 21* 24  GLUCOSE 48* 87 141* 155* 135* 108*  BUN 21 16 32* 46* 51* 43*  CREATININE 4.17* 2.83* 3.50* 4.16* 4.15* 3.85*  CALCIUM 8.4* 8.5* 8.2* 7.8* 7.7* 7.5*  MG  --  1.8  --   --  2.0 1.9  PHOS 4.9* 4.5 5.4*  --  5.5* 4.4   GFR: Estimated Creatinine Clearance: 11.2 mL/min (A) (by C-G formula based on SCr of 3.85 mg/dL (H)). Liver Function Tests: Recent Labs  Lab 10/03/19 1600  10/04/19 1315 10/05/19 1238 10/06/19 0316 10/07/19 0334 10/08/19 0248  AST 55*  --   --  80*  --  193*  --   ALT 26  --   --  38  --  92*  --   ALKPHOS 141*  --   --  152*  --  150*  --   BILITOT 0.9  --   --  1.5*  --  1.1  --   PROT 6.9  --   --  7.0  --  6.9  --   ALBUMIN 2.6*   < > 2.8* 2.8* 2.5* 2.5*  2.4* 2.1*   < > = values in this interval not displayed.   Recent Labs  Lab 10/06/19 1013  LIPASE 49   No results for input(s): AMMONIA in the last 168 hours. Coagulation Profile: Recent Labs  Lab 10/03/19 2054  INR 1.3*   Cardiac Enzymes: Recent Labs  Lab 10/06/19 1013  CKTOTAL 153  CKMB 9.4*   BNP (last 3 results) No results for input(s): PROBNP in the last 8760 hours. HbA1C: No results for input(s): HGBA1C in the last 72 hours. CBG: Recent Labs  Lab 10/07/19 0759 10/07/19 1126 10/07/19 1623 10/07/19 2115 10/08/19 0627  GLUCAP 127* 133* 101* 111* 98   Lipid Profile: No results for  input(s): CHOL, HDL, LDLCALC, TRIG, CHOLHDL, LDLDIRECT in the last 72 hours. Thyroid Function Tests: Recent Labs    10/05/19 1053  TSH 4.059   Anemia Panel: No results for input(s): VITAMINB12, FOLATE, FERRITIN, TIBC, IRON, RETICCTPCT in the last 72 hours. Sepsis Labs: Recent Labs  Lab 10/04/19 0035 10/05/19 1053 10/05/19 1238 10/05/19 1551 10/06/19 0316 10/06/19 1013 10/07/19 0334  PROCALCITON  --   --  1.43  --  1.88  --  2.15  LATICACIDVEN 2.3* 8.0*  --  7.5*  --  3.8*  --     Recent Results (from the past 240 hour(s))  Culture, blood (routine x 2)     Status: None (Preliminary result)   Collection Time: 10/03/19  8:54 PM   Specimen: BLOOD  Result Value Ref Range Status   Specimen Description BLOOD RIGHT ARM  Final   Special Requests   Final    BOTTLES DRAWN AEROBIC AND ANAEROBIC Blood Culture adequate volume   Culture   Final    NO GROWTH 4 DAYS Performed at [facility], [place] [address], [place], [place] [address]    Report Status PENDING  Incomplete  SARS CORONAVIRUS 2 (TAT 6-24 HRS) Nasopharyngeal Nasopharyngeal Swab     Status: None   Collection Time: 10/04/19 12:40 AM   Specimen: Nasopharyngeal Swab  Result Value Ref Range Status   SARS Coronavirus 2 NEGATIVE NEGATIVE Final    Comment: (NOTE) SARS-CoV-2 target nucleic acids are NOT DETECTED. The SARS-CoV-2 RNA is generally detectable in upper and lower respiratory specimens during the acute phase of infection. Negative results do not preclude SARS-CoV-2 infection, do not rule out co-infections with other pathogens, and should not be used as the sole basis for treatment or other patient management decisions. Negative results must be combined with clinical observations, patient history, and epidemiological information. The expected result is Negative. Fact Sheet for Patients: SugarRoll.be Fact Sheet for Healthcare Providers: https://www.woods-mathews.com/ This  test is not yet approved or cleared by the [place] FDA and  has been authorized for detection and/or diagnosis of SARS-CoV-2 by FDA under an Emergency Use Authorization (EUA). This EUA will remain  in effect (meaning this test can be used) for the duration of the COVID-19 declaration under Section 56 4(b)(1) of the Act, 21 U.S.C. section 360bbb-3(b)(1),  unless the authorization is terminated or revoked sooner. Performed at [facility], [place] [address], [place], [place] [address]   MRSA PCR Screening     Status: None   Collection Time: 10/05/19  3:31 AM   Specimen: Nasopharyngeal  Result Value Ref Range Status   MRSA by PCR NEGATIVE NEGATIVE Final    Comment:        The GeneXpert MRSA Assay (FDA approved for NASAL specimens only), is one component of a comprehensive MRSA colonization surveillance program. It is not intended to diagnose MRSA infection nor to guide or monitor treatment for MRSA infections. Performed at [facility], [place] [address], [place],  [address]          Radiology Studies: No results found.      Scheduled Meds: . Chlorhexidine Gluconate Cloth  6 each Topical Q0600  . cinacalcet  30 mg Oral Q supper  . darbepoetin (ARANESP) injection - DIALYSIS  100 mcg Intravenous Q Fri-HD  . doxercalciferol  1 mcg Intravenous Q M,W,F-HD  . midodrine  10 mg Oral TID WC  . multivitamin  1 tablet Oral QHS   Continuous Infusions: . sodium chloride Stopped (10/05/19 0224)  . ceFEPime (MAXIPIME) IV Stopped (10/07/19 0947)  . dextrose 10 mL/hr at 10/07/19 1200  . norepinephrine (LEVOPHED) Adult infusion Stopped (10/06/19 2301)     LOS: 5 days   Time spent=45 mins    Ankit Arsenio Loader, MD [facility]  If 7PM-7AM, please contact night-coverage  10/08/2019, 7:53 AM

## 2019-10-08 NOTE — TOC Progression Note (Addendum)
Transition of Care [facility]) - Progression Note    Patient Details  Name: [name] [name] MRN: [id number] Date of Birth: [birth year]  [facility] CM/SW Contact  Sharin Mons, RN Phone Number: 10/08/2019, 11:58 AM  Clinical Narrative:    Pt transferred from 77M to 2W on 12/7. NCM attempted to call daughter [name] [name] to discuss  TOC needs today, however, call was unsuccessful, unable to leave voice message.  [name] [name] (Daughter)     [phone]       Per palliative [place] meeting on 12/7, family  is open to all offered and available medical interventions to prolong life   TOC team will continue to follow and monitor for needs ....      Expected Discharge Plan and Services    Social Determinants of Health (SDOH) Interventions    Readmission Risk Interventions No flowsheet data found.

## 2019-10-09 DIAGNOSIS — Z515 Encounter for palliative care: Secondary | ICD-10-CM

## 2019-10-09 DIAGNOSIS — E785 Hyperlipidemia, unspecified: Secondary | ICD-10-CM

## 2019-10-09 LAB — GLUCOSE, CAPILLARY
Glucose-Capillary: 108 mg/dL — ABNORMAL HIGH (ref 70–99)
Glucose-Capillary: 113 mg/dL — ABNORMAL HIGH (ref 70–99)
Glucose-Capillary: 123 mg/dL — ABNORMAL HIGH (ref 70–99)
Glucose-Capillary: 131 mg/dL — ABNORMAL HIGH (ref 70–99)
Glucose-Capillary: 170 mg/dL — ABNORMAL HIGH (ref 70–99)
Glucose-Capillary: 47 mg/dL — ABNORMAL LOW (ref 70–99)
Glucose-Capillary: 63 mg/dL — ABNORMAL LOW (ref 70–99)
Glucose-Capillary: 63 mg/dL — ABNORMAL LOW (ref 70–99)
Glucose-Capillary: 64 mg/dL — ABNORMAL LOW (ref 70–99)
Glucose-Capillary: 83 mg/dL (ref 70–99)

## 2019-10-09 LAB — COMPREHENSIVE METABOLIC PANEL
ALT: 157 U/L — ABNORMAL HIGH (ref 0–44)
ALT: 185 U/L — ABNORMAL HIGH (ref 0–44)
AST: 332 U/L — ABNORMAL HIGH (ref 15–41)
AST: 402 U/L — ABNORMAL HIGH (ref 15–41)
Albumin: 2.2 g/dL — ABNORMAL LOW (ref 3.5–5.0)
Albumin: 2.2 g/dL — ABNORMAL LOW (ref 3.5–5.0)
Alkaline Phosphatase: 193 U/L — ABNORMAL HIGH (ref 38–126)
Alkaline Phosphatase: 175 U/L — ABNORMAL HIGH (ref 38–126)
Anion gap: 22 — ABNORMAL HIGH (ref 5–15)
Anion gap: 22 — ABNORMAL HIGH (ref 5–15)
BUN: 51 mg/dL — ABNORMAL HIGH (ref 8–23)
BUN: 51 mg/dL — ABNORMAL HIGH (ref 8–23)
CO2: 13 mmol/L — ABNORMAL LOW (ref 22–32)
CO2: 14 mmol/L — ABNORMAL LOW (ref 22–32)
Calcium: 7.6 mg/dL — ABNORMAL LOW (ref 8.9–10.3)
Calcium: 7.6 mg/dL — ABNORMAL LOW (ref 8.9–10.3)
Chloride: 102 mmol/L (ref 98–111)
Chloride: 99 mmol/L (ref 98–111)
Creatinine, Ser: 4.57 mg/dL — ABNORMAL HIGH (ref 0.44–1.00)
Creatinine, Ser: 4.7 mg/dL — ABNORMAL HIGH (ref 0.44–1.00)
GFR calc Af Amer: 10 mL/min — ABNORMAL LOW (ref >60)
GFR calc Af Amer: 10 mL/min — ABNORMAL LOW (ref >60)
GFR calc non Af Amer: 9 mL/min — ABNORMAL LOW (ref >60)
GFR calc non Af Amer: 8 mL/min — ABNORMAL LOW (ref >60)
Glucose, Bld: 140 mg/dL — ABNORMAL HIGH (ref 70–99)
Glucose, Bld: 111 mg/dL — ABNORMAL HIGH (ref 70–99)
Potassium: 5 mmol/L (ref 3.5–5.1)
Potassium: 4.8 mmol/L (ref 3.5–5.1)
Sodium: 137 mmol/L (ref 135–145)
Sodium: 135 mmol/L (ref 135–145)
Total Bilirubin: 1.8 mg/dL — ABNORMAL HIGH (ref 0.3–1.2)
Total Bilirubin: 1.7 mg/dL — ABNORMAL HIGH (ref 0.3–1.2)
Total Protein: 6 g/dL — ABNORMAL LOW (ref 6.5–8.1)
Total Protein: 5.7 g/dL — ABNORMAL LOW (ref 6.5–8.1)

## 2019-10-09 LAB — CBC
HCT: 35.3 % — ABNORMAL LOW (ref 36.0–46.0)
HCT: 34.8 % — ABNORMAL LOW (ref 36.0–46.0)
Hemoglobin: 11.4 g/dL — ABNORMAL LOW (ref 12.0–15.0)
Hemoglobin: 10.9 g/dL — ABNORMAL LOW (ref 12.0–15.0)
MCH: 33.4 pg (ref 26.0–34.0)
MCH: 33.3 pg (ref 26.0–34.0)
MCHC: 32.3 g/dL (ref 30.0–36.0)
MCHC: 31.3 g/dL (ref 30.0–36.0)
MCV: 103.5 fL — ABNORMAL HIGH (ref 80.0–100.0)
MCV: 106.4 fL — ABNORMAL HIGH (ref 80.0–100.0)
Platelets: 47 10*3/uL — ABNORMAL LOW (ref 150–400)
Platelets: 46 10*3/uL — ABNORMAL LOW (ref 150–400)
RBC: 3.41 MIL/uL — ABNORMAL LOW (ref 3.87–5.11)
RBC: 3.27 MIL/uL — ABNORMAL LOW (ref 3.87–5.11)
RDW: 18.8 % — ABNORMAL HIGH (ref 11.5–15.5)
RDW: 19.1 % — ABNORMAL HIGH (ref 11.5–15.5)
WBC: 12 10*3/uL — ABNORMAL HIGH (ref 4.0–10.5)
WBC: 13.1 10*3/uL — ABNORMAL HIGH (ref 4.0–10.5)
nRBC: 29.5 % — ABNORMAL HIGH (ref 0.0–0.2)
nRBC: 28 % — ABNORMAL HIGH (ref 0.0–0.2)

## 2019-10-09 LAB — PHOSPHORUS: Phosphorus: 5.8 mg/dL — ABNORMAL HIGH (ref 2.5–4.6)

## 2019-10-09 LAB — MAGNESIUM: Magnesium: 2.5 mg/dL — ABNORMAL HIGH (ref 1.7–2.4)

## 2019-10-09 MED ORDER — ORAL CARE MOUTH RINSE
15.0000 mL | Freq: Two times a day (BID) | OROMUCOSAL | Status: DC
  Administered 2019-10-09 – 2019-10-11 (×5): 15 mL via OROMUCOSAL

## 2019-10-09 MED ORDER — DEXTROSE 50 % IV SOLN
12.5000 g | INTRAVENOUS | Status: DC | PRN
  Administered 2019-10-09 (×4): 25 g via INTRAVENOUS
  Filled 2019-10-09 (×4): qty 50

## 2019-10-09 MED FILL — Medication: Qty: 1 | Status: AC

## 2019-10-09 NOTE — Progress Notes (Signed)
[facility] ROUNDING NOTE   Subjective:   78 year old lady with history of end-stage renal disease Monday Wednesday Friday dialysis secondary to diabetes.  She also has depression gastroesophageal reflux disease noncompliance and hypertension.  She was brought in by family due to altered mental status and complaints of pain left leg which is nonspecific.       Update Daily Moved to ICU overnight due to shock and tachycardia. On neo gtt this am.  Made DNR/ DNI now.    Objective:  Vital signs in last 24 hours:  Temp:  [97.6 F (36.4 C)] 97.6 F (36.4 C) (12/09 0800) Pulse Rate:  [53-159] 57 (12/09 0900) Resp:  [12-27] 15 (12/09 0900) BP: (31-104)/(13-74) 61/50 (12/09 0900) SpO2:  [42 %-100 %] 94 % (12/09 0900) Weight:  [64.5 kg] 64.5 kg (12/09 0500)  Weight change: 6.031 kg Filed Weights   10/07/19 0227 10/08/19 0450 10/09/19 0500  Weight: 61.4 kg 58.5 kg 64.5 kg    Intake/Output: I/O last 3 completed shifts: In: 1822.9 [P.O.:120; I.V.:1365.9; IV Piggyback:337] Out: -    Intake/Output this shift:  Total I/O In: 203.4 [I.V.:203.4] Out: 0   Exam General: Chronically ill-appearing lady no obvious distress, confused and sedated Neck:  Supple; no masses or thyromegaly. JVP not elevated Lungs:  CTA bilat. No wheezes, crackles, or rhonchi. No acute distress. Heart:  Regular rate and rhythm; no murmurs, clicks, rubs,  or gallops. Abdomen:  Soft, nontender and nondistended Extremities   left AV graft no edema Neuro: NF, Ox 1   Basic Metabolic Panel: Recent Labs  Lab 10/05/19 1238 10/06/19 0316 10/06/19 2336 10/07/19 0334 10/08/19 0248 10/09/19 0510  NA 137 134* 133* 134* 135 137  K 4.6 5.0 4.8 4.8 4.0 5.0  CL 98 97* 96* 96* 98 102  CO2 21* 20* 20* 21* 24 13*  GLUCOSE 87 141* 155* 135* 108* 140*  BUN 16 32* 46* 51* 43* 51*  CREATININE 2.83* 3.50* 4.16* 4.15* 3.85* 4.57*  CALCIUM 8.5* 8.2* 7.8* 7.7* 7.5* 7.6*  MG 1.8  --   --  2.0 1.9 2.5*  PHOS 4.5  5.4*  --  5.5* 4.4 5.8*    Liver Function Tests: Recent Labs  Lab 10/03/19 1600  10/05/19 1238 10/06/19 0316 10/07/19 0334 10/08/19 0248 10/09/19 0510  AST 55*  --  80*  --  193*  --  332*  ALT 26  --  38  --  92*  --  157*  ALKPHOS 141*  --  152*  --  150*  --  193*  BILITOT 0.9  --  1.5*  --  1.1  --  1.8*  PROT 6.9  --  7.0  --  6.9  --  6.0*  ALBUMIN 2.6*   < > 2.8* 2.5* 2.5*  2.4* 2.1* 2.2*   < > = values in this interval not displayed.   Recent Labs  Lab 10/06/19 1013  LIPASE 49   No results for input(s): AMMONIA in the last 168 hours.  CBC: Recent Labs  Lab 10/03/19 1600  10/05/19 1053 10/06/19 0316 10/07/19 0334 10/08/19 0248 10/09/19 0510  WBC 2.6*   < > 6.4 8.6 7.6 6.3 12.0*  NEUTROABS 1.9  --   --   --   --   --   --   HGB 9.1*   < > 10.2* 11.3* 10.5* 10.7* 11.4*  HCT 29.0*   < > 31.5* 35.6* 32.1* 31.8* 35.3*  MCV 105.1*   < >  102.9* 102.6* 99.4 99.7 103.5*  PLT 62*   < > 84* 98* 81* 73* 47*   < > = values in this interval not displayed.      Medications:   . sodium chloride 10 mL/hr at 10/09/19 0900  . amiodarone 30 mg/hr (10/09/19 0844)  . ceFEPime (MAXIPIME) IV 1 g (10/09/19 1037)  . phenylephrine (NEO-SYNEPHRINE) Adult infusion 50 mcg/min (10/09/19 1038)  . sodium chloride     . Chlorhexidine Gluconate Cloth  6 each Topical Q0600  . cinacalcet  30 mg Oral Q supper  . darbepoetin (ARANESP) injection - DIALYSIS  100 mcg Intravenous Q Fri-HD  . doxercalciferol  1 mcg Intravenous Q M,W,F-HD  . mouth rinse  15 mL Mouth Rinse BID  . midodrine  10 mg Oral TID WC  . multivitamin  1 tablet Oral QHS   sodium chloride, acetaminophen **OR** acetaminophen, calcium carbonate (dosed in mg elemental calcium), camphor-menthol **AND** hydrOXYzine, dextrose, docusate sodium, feeding supplement (NEPRO CARB STEADY), ipratropium-albuterol, ondansetron **OR** ondansetron (ZOFRAN) IV, polyethylene glycol, senna-docusate, sorbitol    Dialysis: [facility]   3.5h    52.5kg  2/2.25 bath  400/800   L AVG  Hep none  -mircera 100 q 2, last ?  - hect 1 ug    CXR 12/5 - mod R effusion, no pulm edema  Assessment/ Plan:   ESRD- usual HD is MWF.  Pt now in shock in ICU. Agree not stable for regular HD and not candidate for CRRT.    ANEMIA-it appears that she has some thrombocytopenia.  Her white count and hemoglobin have improved.  MBD-calcium and phosphorus appears to be well controlled and continues doxercalciferol 1 mcg q. treatment  HTN/VOL- no sig vol overload  ACCESS-AV graft  Hypotension - severe CHF/ CM w/ bivent HF and severe AS, per cardiology not candidate for intervention and prognosis is extremely poor.  Pall care consulting. DNR now.   Kelly Splinter, MD 10/09/2019, 11:03 AM

## 2019-10-09 NOTE — Progress Notes (Signed)
Patient ID: [name] [name], female   DOB: 1941-04-21, 78 y.o.   MRN: [id number]  This NP visited patient at the bedside as a follow up to  yesterday's palliative meeting with daughter/Sandra at bedside.    We scheduled a meeting for today at 2 pm, daughter was not present for the meeting.   I placed a call and was able to speak to her by phone later in the afternoon.  We had a  continued conversation regarding current medical situation, treatment option decisions, advanced directive decisions.  Again we discussed input from cardiology suggesting comfort approach 2/2 to her significant cardiac history ;  Systolic HF/ EF 123456 %, severe AS, inability to dialyze due to hypotension.    Discussed the likelihood that ongoing dialysis was limited.    Daughter tells me she understands the seriousness of the situation,  however at this time she is unable/unwilling to make any changes to treatment plan.  She states "we are getting closer to a decision, we know what we will do"  Emotional support offered  Questions and concerns addressed   Discussed with Kennieth Rad NP  Total time spent on the unit was 25 minutes  Greater than 50% of the time was spent in counseling and coordination of care  Wadie Lessen NP  Palliative Medicine Team Team Phone # [phone] Pager [phone]

## 2019-10-09 NOTE — Progress Notes (Signed)
Hypoglycemic Event  CBG:  47  Treatment: 1 amp D50 followed by another amp D50  Symptoms: lethargy  CBG Result: 64  Followed by 123 after 2nd amp D50  Possible Reasons for Event:  Lack of nutrition    Standing protocol followed    Tomothy Eddins A

## 2019-10-09 NOTE — Progress Notes (Signed)
Hypoglycemic Event  CBG: 63  Treatment:1 amp D50  Symptoms: Hungry and Nervous/irritable  Follow-up CBG: Time:1627 CBG Result:131  Possible Reasons for Event: Inadequate meal intake  Comments/MD notified: Kennieth Rad, NP    Nancey Kreitz L

## 2019-10-09 NOTE — Progress Notes (Signed)
Nutrition Brief Note  Chart reviewed.  78 year old female who presented to the ED on 12/03 with AMS. PMH of ESRD on HD, T1DM, depression, GERD, HTN, HLD, dementia. Pt admitted with sepsis secondary to UTI. Pt found to have acute systolic HF with cardiogenic shock.  Per CCM note: "Patient with significant comorbidities with associated severe protein calorie malnutrition. Decision already made not to escalate care. Patient is DO NOT RESUSCITATE/DO NOT INTUBATE.  Comfort measures is appropriate."  No further nutrition interventions warranted at this time.  Please re-consult as needed.    Gaynell Face, MS, RD, LDN Inpatient Clinical Dietitian Pager: [phone] Weekend/After Hours: [phone]

## 2019-10-09 NOTE — Progress Notes (Signed)
Pharmacy Antibiotic Note  [name] [name] is a 78 y.o. female admitted on 10/03/2019 with sepsis, UTI.  Pharmacy has been consulted for Cefepime dosing.  ID: abx for Sepsis/UTI -WBC up to 12, PCT 1.88>2.15, Dose for ESRD  Cefepime 12/3; 12/5>>(12/11) Ceftriaxone 12/4x1 Vanc 12/5>>12/7  Bcx 12/3: negative 12/5 MRSA PCR negative 12/4 COVID negative  Plan: Cefepime 1 g q24h x 7 days (12/5>12/11) Monitor Hgb prior to HD on Friday and hold Darb if >11 Consider transition to comfort care    Height: 5' 5.5" (166.4 cm) Weight: 142 lb 3.2 oz (64.5 kg) IBW/kg (Calculated) : 58.15  Temp (24hrs), Avg:97.6 F (36.4 C), Min:97.6 F (36.4 C), Max:97.6 F (36.4 C)  Recent Labs  Lab 10/03/19 2054 10/04/19 0035  10/05/19 1053  10/05/19 1551 10/06/19 0316 10/06/19 1013 10/06/19 2336 10/07/19 0334 10/08/19 0248 10/09/19 0510  WBC  --  3.1*   < > 6.4  --   --  8.6  --   --  7.6 6.3 12.0*  CREATININE  --  3.82*   < >  --    < >  --  3.50*  --  4.16* 4.15* 3.85* 4.57*  LATICACIDVEN 1.8 2.3*  --  8.0*  --  7.5*  --  3.8*  --   --   --   --    < > = values in this interval not displayed.    Estimated Creatinine Clearance: 9.5 mL/min (A) (by C-G formula based on SCr of 4.57 mg/dL (H)).    Allergies  Allergen Reactions  . Hydralazine Itching  . Robaxin [Methocarbamol] Other (See Comments)    Dizziness      Angellynn Kimberlin S. Alford Highland, PharmD, BCPS Clinical Staff Pharmacist Eilene Ghazi Stillinger 10/09/2019 9:01 AM

## 2019-10-09 NOTE — Progress Notes (Signed)
Advanced Heart Failure Rounding Note   Subjective:    Now back in NSR with IV amio.   BP improving but still on Neo.   Unable to get HD due to hypotension.  Remains confused but says she is miserable    Objective:   Weight Range:  Vital Signs:   Temp:  [94.9 F (34.9 C)-98.1 F (36.7 C)] 97.5 F (36.4 C) (12/09 2000) Pulse Rate:  [50-62] 56 (12/09 1900) Resp:  [11-27] 12 (12/09 1900) BP: (31-164)/(13-95) 101/52 (12/09 1900) SpO2:  [44 %-100 %] 94 % (12/09 1900) Weight:  [64.5 kg] 64.5 kg (12/09 0500) Last BM Date: 10/08/19  Weight change: Filed Weights   10/07/19 0227 10/08/19 0450 10/09/19 0500  Weight: 61.4 kg 58.5 kg 64.5 kg    Intake/Output:   Intake/Output Summary (Last 24 hours) at 10/09/2019 2048 Last data filed at 10/09/2019 1900 Gross per 24 hour  Intake 2238.57 ml  Output 0 ml  Net 2238.57 ml     Physical Exam: General:  Weak and frail appearing. Confused  HEENT: normal Neck: supple. JVP to earCarotids 2+ bilat; no bruits. No lymphadenopathy or thryomegaly appreciated. Cor: PMI nondisplaced. Regular rate & rhythm.+s3 Lungs: clear Abdomen: soft, nontender, nondistended. No hepatosplenomegaly. No bruits or masses. Good bowel sounds. Extremities: no cyanosis, clubbing, rash, trace edema Neuro: awake confused moves all 4    Sinus 60s (AFL resolved) Personally reviewed    Labs: Basic Metabolic Panel: Recent Labs  Lab 10/05/19 1238 10/06/19 0316 10/06/19 2336 10/07/19 0334 10/08/19 0248 10/09/19 0510 10/09/19 1005  NA 137 134* 133* 134* 135 137 135  K 4.6 5.0 4.8 4.8 4.0 5.0 4.8  CL 98 97* 96* 96* 98 102 99  CO2 21* 20* 20* 21* 24 13* 14*  GLUCOSE 87 141* 155* 135* 108* 140* 111*  BUN 16 32* 46* 51* 43* 51* 51*  CREATININE 2.83* 3.50* 4.16* 4.15* 3.85* 4.57* 4.70*  CALCIUM 8.5* 8.2* 7.8* 7.7* 7.5* 7.6* 7.6*  MG 1.8  --   --  2.0 1.9 2.5*  --   PHOS 4.5 5.4*  --  5.5* 4.4 5.8*  --     Liver Function Tests: Recent Labs  Lab  10/03/19 1600  10/05/19 1238 10/06/19 0316 10/07/19 0334 10/08/19 0248 10/09/19 0510 10/09/19 1005  AST 55*  --  80*  --  193*  --  332* 402*  ALT 26  --  38  --  92*  --  157* 185*  ALKPHOS 141*  --  152*  --  150*  --  193* 175*  BILITOT 0.9  --  1.5*  --  1.1  --  1.8* 1.7*  PROT 6.9  --  7.0  --  6.9  --  6.0* 5.7*  ALBUMIN 2.6*   < > 2.8* 2.5* 2.5*  2.4* 2.1* 2.2* 2.2*   < > = values in this interval not displayed.   Recent Labs  Lab 10/06/19 1013  LIPASE 49   No results for input(s): AMMONIA in the last 168 hours.  CBC: Recent Labs  Lab 10/03/19 1600  10/06/19 0316 10/07/19 0334 10/08/19 0248 10/09/19 0510 10/09/19 1005  WBC 2.6*   < > 8.6 7.6 6.3 12.0* 13.1*  NEUTROABS 1.9  --   --   --   --   --   --   HGB 9.1*   < > 11.3* 10.5* 10.7* 11.4* 10.9*  HCT 29.0*   < > 35.6* 32.1* 31.8* 35.3* 34.8*  MCV 105.1*   < > 102.6* 99.4 99.7 103.5* 106.4*  PLT 62*   < > 98* 81* 73* 47* 46*   < > = values in this interval not displayed.    Cardiac Enzymes: Recent Labs  Lab 10/06/19 1013  CKTOTAL 153  CKMB 9.4*    BNP: BNP (last 3 results) No results for input(s): BNP in the last 8760 hours.  ProBNP (last 3 results) No results for input(s): PROBNP in the last 8760 hours.    Other results:  Imaging: No results found.   Medications:     Scheduled Medications: . Chlorhexidine Gluconate Cloth  6 each Topical Q0600  . cinacalcet  30 mg Oral Q supper  . darbepoetin (ARANESP) injection - DIALYSIS  100 mcg Intravenous Q Fri-HD  . doxercalciferol  1 mcg Intravenous Q M,W,F-HD  . mouth rinse  15 mL Mouth Rinse BID  . midodrine  10 mg Oral TID WC  . multivitamin  1 tablet Oral QHS    Infusions: . sodium chloride 10 mL/hr at 10/09/19 1900  . amiodarone 30 mg/hr (10/09/19 1900)  . phenylephrine (NEO-SYNEPHRINE) Adult infusion 10 mcg/min (10/09/19 1900)  . sodium chloride      PRN Medications: sodium chloride, acetaminophen **OR** acetaminophen, calcium  carbonate (dosed in mg elemental calcium), camphor-menthol **AND** hydrOXYzine, dextrose, docusate sodium, feeding supplement (NEPRO CARB STEADY), ipratropium-albuterol, ondansetron **OR** ondansetron (ZOFRAN) IV, polyethylene glycol, senna-docusate, sorbitol   Assessment/Plan:   1. Acute systolic HF with cardiogenic shock - Echo 12/6 with severe biventricular dysfunction with LVEF 15-20% severe RV dysfunction with RVSP ~75. Severe Tr, moderate MR and severe low gradient AS with VR = 0.30 - echo in 1/19 with EF 55-60%  Likely combination of sepsis and profound cardiogenic shock - etiology of cardiomyopathy is unclear. ? Septic, ischemic or AS. Ideally would have cath but overall condition seems quite tenuous and Palliative/Hospice may be more appropriate - Developed AFL with RVR overnight with severe clinical decompensation. Now on neo. Back in NSR with amio.  - Family has made DNR/DNI with no further escalation of care but has not made comfort care yet  She remains critically ill in setting of severe HF, AS and now AFL with RVR. I tried to explain yesterday to her daughter that the only really option here was comfort care and that she was not a candidate for further aggressive care, She is not ready to switch to comfort care but has mader her DNR/DNI without further escalation. Will continue [place] discussions. D/w CCMW   2. UTI/sepsis - being treated for UTI. Bcx 1/1 on 12/3 NGTD - COVID negative - PCT 1.88-> 2.15 - on vanc/cefepime per CCM  3. Severe AS - echo c/w low-gradient severe AS with dimensionless index 0.30 - not TAVR candidate currently due to dementia  4. ESRD - managed with HD. Renal following  5. Dementia - baseline unclear.  6. A flutter - back in NSR on amio    CRITICAL CARE Performed by: Glori Bickers  Total critical care time: 35 minutes  Critical care time was exclusive of separately billable procedures and treating other patients.  Critical care  was necessary to treat or prevent imminent or life-threatening deterioration.  Critical care was time spent personally by me (independent of midlevel providers or residents) on the following activities: development of treatment plan with patient and/or surrogate as well as nursing, discussions with consultants, evaluation of patient's response to treatment, examination of patient, obtaining history from patient or surrogate, ordering and  performing treatments and interventions, ordering and review of laboratory studies, ordering and review of radiographic studies, pulse oximetry and re-evaluation of patient's condition.    Length of Stay: 6   Glori Bickers MD 10/09/2019, 8:48 PM  Advanced Heart Failure Team Pager [phone] (M-F; [place])  Please contact [facility] for night-coverage after hours (4p -7a ) and weekends on [email or web address]

## 2019-10-09 NOTE — Progress Notes (Signed)
NAME:  [name] [name], MRN:  [id number], DOB:  January 22, 1941, LOS: 6 ADMISSION DATE:  10/03/2019, CONSULTATION DATE:  10/09/19  CHIEF COMPLAINT:  hypotension  BRIEF  78 y.o. F with PMH of Dementia, ESRD on MWF dialysis, Type 1 DM, HL, HTN who presented on 12/3 with confusion and weakness.  Family noted decreased oral intake and diaphoresis and chills.  She does make urine and had WBC's and bacteria, so was started on Ceftriaxone.  CT head was negative for acute findings.   Starting 12/4 pt's BP started down-trending, lactic acid 2.4.  She was started on NS 75cc/hr and overnight BP as low as 56/42.  She was given a total of 2L IVF boluses and transferred to the ICU.   Past Medical History   has a past medical history of DEPRESSION, DIABETES MELLITUS, TYPE I, ESRD (end stage renal disease) on dialysis ([place]), GERD, GLAUCOMA, Headache(784.0), Hepatitis B carrier ([place]), HYPERLIPIDEMIA, HYPERTENSION, Memory deficit, OSTEOPENIA, POSTMENOPAUSAL STATUS, Pulmonary edema, and Unspecified vitamin D deficiency.   Significant Hospital Events   12/3 Admit to Hospitalists 12/5 - Pt denying lung ultrasound.  Repeat rounds. Not on vent. Awake and alert. And ORiented. Diastolic low - SBP > 123XX123 with MAP > 50/55. Refuses IV, CVL, blood draws.Says goal is to go home well and asap. Says blood test last week is sufficient. Seems to have capacity. On 40mcg levophd via one PIV.  Per RN s./p 1L HD volume removal yesterday 12/7-transferred to TRH/floor 12/8 PCCM reconsulted d/t tachycardia, confusion, hypotension, tx to ICU overnight on low dose pressors, ongoing SVT- made DNR/ DNI  Consults:  PCCM Nephrology  Procedures:    Significant Diagnostic Tests:  12/6 Echo >> severe biventricular dysfunction with LVEF 15-20% severe RV dysfunction with RVSP ~75. Severe Tr, moderate MR and severe low gradient AS with VR = 0.30  Micro Data:  UC>>never sent 12/3 BCx2>>NG x 5 days  12/4 Sars-CoV-2>>negative MRSA surveillance  negative   Antimicrobials:  Ceftriaxone 12/3 >> 12/5 Cefepime 12/5 >>12/9 Vancomycin 12/5 x 1 dose   Interim history/subjective:  Overnight, decompensated, code status changed to DNR/ DNI, hyopglycemic episodes, re-occurrence of SVT improved with amio and mag bolus- currently in SB, ongoing low BP on Neo at 50 mcg/min- no augmentation of care   Currently awake, confused, no distress on room air despite BP reading on leg of SBP 50 Remains afebrile   Objective   Blood pressure (!) 38/24, pulse (!) 54, temperature 97.6 F (36.4 C), temperature source Axillary, resp. rate 13, height 5' 5.5" (1.664 m), weight 64.5 kg, SpO2 93 %.        Intake/Output Summary (Last 24 hours) at 10/09/2019 0900 Last data filed at 10/09/2019 0400 Gross per 24 hour  Intake 1451.25 ml  Output -  Net 1451.25 ml   Filed Weights   10/07/19 0227 10/08/19 0450 10/09/19 0500  Weight: 61.4 kg 58.5 kg 64.5 kg   General:  Cachectic/ elderly female sitting, pright in bed in NAD HEENT: MM pink/moist  Neuro: Oriented to name, follows some commands, MAE CV: rr, currently SB, loud murmur PULM:  Non-labored, on room air, clear anteriorly, diminished in bases GI: soft, hypoBS Extremities: cool/dry, no LE edema, LUE AVF +B/T  Skin: no rashes   Resolved Hospital Problem list     Assessment & Plan:  # Hypotension 2/2 ST/Aflutter in setting of BiV failure with cardiogenic shock and critical AoS non-amenable to TAVR  # ESRD on HD # UTI/ sepsis # Dementia- appears advanced #  Severe protein calorie malnutrition present on admission # Frail elderly- appears to be approaching end of life # Starvation ketosis # DM2/ hypoglycemia   - DNR/ DNI - no augmentation/ escalation of care, will leave Neo gtt at 50 mcg/min (doubtful that is her true pressure given mental status) - monitor CBG's- if re-occurance of hypoglycemia, consider starting D5 - not stable for iHD today, not a candidate for CRRT  - continue amio gtt for  now, QTc .45, if worsening bradycardia will d/c - appreciate palliative care assistance.  Daughter went home around shift change.  Has 2 daughters, will need to discuss transition to comfort care - redraw am labs as they appear erroneous  - hold further abx s/p 7 days completion, monitor clinically  - continue midodrine if awake  - high aspiration risk    CCT 30 mins  Kennieth Rad, MSN, AGACNP-BC [facility] 10/09/2019, 9:15 AM

## 2019-10-10 DIAGNOSIS — A419 Sepsis, unspecified organism: Principal | ICD-10-CM

## 2019-10-10 DIAGNOSIS — R6521 Severe sepsis with septic shock: Secondary | ICD-10-CM

## 2019-10-10 LAB — CBC
HCT: 31.9 % — ABNORMAL LOW (ref 36.0–46.0)
Hemoglobin: 10.2 g/dL — ABNORMAL LOW (ref 12.0–15.0)
MCH: 32.8 pg (ref 26.0–34.0)
MCHC: 32 g/dL (ref 30.0–36.0)
MCV: 102.6 fL — ABNORMAL HIGH (ref 80.0–100.0)
Platelets: 43 10*3/uL — ABNORMAL LOW (ref 150–400)
RBC: 3.11 MIL/uL — ABNORMAL LOW (ref 3.87–5.11)
RDW: 18.9 % — ABNORMAL HIGH (ref 11.5–15.5)
WBC: 9.4 10*3/uL (ref 4.0–10.5)
nRBC: 14.2 % — ABNORMAL HIGH (ref 0.0–0.2)

## 2019-10-10 LAB — GLUCOSE, CAPILLARY
Glucose-Capillary: 146 mg/dL — ABNORMAL HIGH (ref 70–99)
Glucose-Capillary: 114 mg/dL — ABNORMAL HIGH (ref 70–99)
Glucose-Capillary: 127 mg/dL — ABNORMAL HIGH (ref 70–99)
Glucose-Capillary: 96 mg/dL (ref 70–99)
Glucose-Capillary: 109 mg/dL — ABNORMAL HIGH (ref 70–99)
Glucose-Capillary: 102 mg/dL — ABNORMAL HIGH (ref 70–99)

## 2019-10-10 LAB — COMPREHENSIVE METABOLIC PANEL
ALT: 308 U/L — ABNORMAL HIGH (ref 0–44)
AST: 711 U/L — ABNORMAL HIGH (ref 15–41)
Albumin: 2.1 g/dL — ABNORMAL LOW (ref 3.5–5.0)
Alkaline Phosphatase: 163 U/L — ABNORMAL HIGH (ref 38–126)
Anion gap: 17 — ABNORMAL HIGH (ref 5–15)
BUN: 57 mg/dL — ABNORMAL HIGH (ref 8–23)
CO2: 19 mmol/L — ABNORMAL LOW (ref 22–32)
Calcium: 7.4 mg/dL — ABNORMAL LOW (ref 8.9–10.3)
Chloride: 101 mmol/L (ref 98–111)
Creatinine, Ser: 5.01 mg/dL — ABNORMAL HIGH (ref 0.44–1.00)
GFR calc Af Amer: 9 mL/min — ABNORMAL LOW (ref >60)
GFR calc non Af Amer: 8 mL/min — ABNORMAL LOW (ref >60)
Glucose, Bld: 154 mg/dL — ABNORMAL HIGH (ref 70–99)
Potassium: 4.9 mmol/L (ref 3.5–5.1)
Sodium: 137 mmol/L (ref 135–145)
Total Bilirubin: 1.7 mg/dL — ABNORMAL HIGH (ref 0.3–1.2)
Total Protein: 5.5 g/dL — ABNORMAL LOW (ref 6.5–8.1)

## 2019-10-10 LAB — PHOSPHORUS: Phosphorus: 5.8 mg/dL — ABNORMAL HIGH (ref 2.5–4.6)

## 2019-10-10 LAB — MAGNESIUM: Magnesium: 2.3 mg/dL (ref 1.7–2.4)

## 2019-10-10 MED ORDER — AMIODARONE HCL 200 MG PO TABS
200.0000 mg | ORAL_TABLET | Freq: Two times a day (BID) | ORAL | Status: DC
  Filled 2019-10-10: qty 1

## 2019-10-10 MED ORDER — AMIODARONE HCL IN DEXTROSE 360-4.14 MG/200ML-% IV SOLN
30.0000 mg/h | INTRAVENOUS | Status: DC
  Administered 2019-10-10 – 2019-10-11 (×3): 30 mg/h via INTRAVENOUS
  Filled 2019-10-10 (×2): qty 200

## 2019-10-10 MED ORDER — AMIODARONE HCL IN DEXTROSE 360-4.14 MG/200ML-% IV SOLN: 30.0000 mg/h | INTRAVENOUS | Status: DC

## 2019-10-10 MED ORDER — LORAZEPAM 2 MG/ML IJ SOLN: 1.0000 mg | INTRAMUSCULAR | Status: DC | PRN

## 2019-10-10 MED ORDER — AMIODARONE HCL 200 MG PO TABS: 200.0000 mg | ORAL_TABLET | Freq: Two times a day (BID) | ORAL | Status: DC

## 2019-10-10 MED ORDER — MORPHINE SULFATE (PF) 2 MG/ML IV SOLN: 2.0000 mg | INTRAVENOUS | Status: DC | PRN

## 2019-10-10 NOTE — Progress Notes (Signed)
Palliative Medicine RN Note: Our team rec'd a call from [facility] with PCCM; family would like to start preparations to bring Mrs [place] home. Jerene Pitch has initiated [facility] order for home hospice at the family's request. PMT is available for questions, but at this time, discharge plan sounds clear.  Marjie Skiff Nour Rodrigues, RN, BSN, [facility] Palliative Medicine Team 10/10/2019 2:46 PM Office [phone]

## 2019-10-10 NOTE — Progress Notes (Signed)
[facility] ROUNDING NOTE   Subjective:   78 year old lady with history of end-stage renal disease Monday Wednesday Friday dialysis secondary to diabetes.  She also has depression gastroesophageal reflux disease noncompliance and hypertension.  She was brought in by family due to altered mental status and complaints of pain left leg which is nonspecific.       Update Daily Off of neo gtt at this time   Objective:  Vital signs in last 24 hours:  Temp:  [94.9 F (34.9 C)-98.1 F (36.7 C)] 97.1 F (36.2 C) (12/10 0814) Pulse Rate:  [50-65] 62 (12/10 0800) Resp:  [11-22] 17 (12/10 0800) BP: (52-164)/(15-97) 138/55 (12/10 0800) SpO2:  [85 %-97 %] 97 % (12/10 0800) Weight:  [65.5 kg] 65.5 kg (12/10 0500)  Weight change: 1 kg Filed Weights   10/08/19 0450 10/09/19 0500 10/10/19 0500  Weight: 58.5 kg 64.5 kg 65.5 kg    Intake/Output: I/O last 3 completed shifts: In: 3065.7 [I.V.:2897; IV Piggyback:168.7] Out: 0    Intake/Output this shift:  Total I/O In: 26.7 [I.V.:26.7] Out: -   Exam General: confused and sedated Neck:  Supple; no masses or thyromegaly. JVP not elevated Lungs:  CTA bilat. No wheezes, crackles, or rhonchi. No acute distress. Heart:  Regular rate and rhythm; no murmurs, clicks, rubs,  or gallops. Abdomen:  Soft, nontender and nondistended Extremities   left AV graft no edema Neuro: NF, Ox 1   Basic Metabolic Panel: Recent Labs  Lab 10/05/19 1238 10/06/19 0316 10/07/19 0334 10/08/19 0248 10/09/19 0510 10/09/19 1005 10/10/19 0416  NA 137 134* 134* 135 137 135 137  K 4.6 5.0 4.8 4.0 5.0 4.8 4.9  CL 98 97* 96* 98 102 99 101  CO2 21* 20* 21* 24 13* 14* 19*  GLUCOSE 87 141* 135* 108* 140* 111* 154*  BUN 16 32* 51* 43* 51* 51* 57*  CREATININE 2.83* 3.50* 4.15* 3.85* 4.57* 4.70* 5.01*  CALCIUM 8.5* 8.2* 7.7* 7.5* 7.6* 7.6* 7.4*  MG 1.8  --  2.0 1.9 2.5*  --  2.3  PHOS 4.5 5.4* 5.5* 4.4 5.8*  --  5.8*    Liver Function  Tests: Recent Labs  Lab 10/05/19 1238 10/07/19 0334 10/08/19 0248 10/09/19 0510 10/09/19 1005 10/10/19 0416  AST 80* 193*  --  332* 402* 711*  ALT 38 92*  --  157* 185* 308*  ALKPHOS 152* 150*  --  193* 175* 163*  BILITOT 1.5* 1.1  --  1.8* 1.7* 1.7*  PROT 7.0 6.9  --  6.0* 5.7* 5.5*  ALBUMIN 2.8* 2.5*  2.4* 2.1* 2.2* 2.2* 2.1*   Recent Labs  Lab 10/06/19 1013  LIPASE 49   No results for input(s): AMMONIA in the last 168 hours.  CBC: Recent Labs  Lab 10/03/19 1600 10/07/19 0334 10/08/19 0248 10/09/19 0510 10/09/19 1005 10/10/19 0416  WBC 2.6* 7.6 6.3 12.0* 13.1* 9.4  NEUTROABS 1.9  --   --   --   --   --   HGB 9.1* 10.5* 10.7* 11.4* 10.9* 10.2*  HCT 29.0* 32.1* 31.8* 35.3* 34.8* 31.9*  MCV 105.1* 99.4 99.7 103.5* 106.4* 102.6*  PLT 62* 81* 73* 47* 46* 43*      Medications:   . sodium chloride 10 mL/hr at 10/10/19 0800  . phenylephrine (NEO-SYNEPHRINE) Adult infusion Stopped (10/10/19 [id number])  . sodium chloride     . amiodarone  200 mg Oral BID  . Chlorhexidine Gluconate Cloth  6 each Topical Q0600  . cinacalcet  30 mg Oral Q supper  . darbepoetin (ARANESP) injection - DIALYSIS  100 mcg Intravenous Q Fri-HD  . doxercalciferol  1 mcg Intravenous Q M,W,F-HD  . mouth rinse  15 mL Mouth Rinse BID  . midodrine  10 mg Oral TID WC  . multivitamin  1 tablet Oral QHS   sodium chloride, acetaminophen **OR** acetaminophen, calcium carbonate (dosed in mg elemental calcium), camphor-menthol **AND** hydrOXYzine, dextrose, docusate sodium, feeding supplement (NEPRO CARB STEADY), ipratropium-albuterol, ondansetron **OR** ondansetron (ZOFRAN) IV, polyethylene glycol, senna-docusate, sorbitol    Dialysis: [facility]   3.5h   52.5kg  2/2.25 bath  400/800   L AVG  Hep none  -mircera 100 q 2, last ?  - hect 1 ug    CXR 12/5 - mod R effusion, no pulm edema  Assessment/ Plan:   ESRD- usual HD is MWF.  Pt was in shock but is now off neo. Not candidate for CRRT. Pt is a poor  candidate for long-term dialysis at this point given recurrent severe hypotension issues related to bad CM and AS.  BP's better today so is off pressors, however is 5-6kg up and won't likely tolerate fluid removal on HD, again due to severe cardiac issues. Would support transition to hospice care. Will hold HD for now as pall care discussions are ongoing.   ANEMIA-it appears that she has some thrombocytopenia.  Her white count and hemoglobin have improved.  MBD- no changes  HTN/VOL- up 5-6kg today  ACCESS-AV graft  Hypotension - severe CHF/ CM w/ bivent HF and severe AS, per cardiology not candidate for intervention and prognosis is extremely poor.  Pall care consulting. DNR now.   Kelly Splinter, MD 10/10/2019, 9:39 AM

## 2019-10-10 NOTE — Progress Notes (Signed)
[facility])  Referral received for hospice services at home once discharged.  Spoke with dtr and Katy Fitch, provided support and answered questions.  Plan is to d/c tomorrow, once DME delivered.  [facility] will order:  Bed, wheelchair, bedside table and BSC per request of [name].  Please arrange for any comfort prescriptions that may be needed.  Thank you, Venia Carbon RN, BSN, [facility] Liaison (in [place]) [phone]

## 2019-10-10 NOTE — Care Management (Signed)
TOC Case Manager acknowledges consult for Home Hospice Care.  Met with daughter, Sandra Lecomte to discuss home arrangements:  She prefers [facility] for home Hospice services.  Referral to [facility] (spoke with Jen) for home services.  Daughter acknowledges that pt has RW and BSC at home; lives with her husband at [address] [place], [place].  Daughter plans to stay with her parents at discharge.  Jen to contact in house liaison who will make contact with daughter to arrange services and DME.  Will follow/provide updates as available.     W. , RN, BSN  Trauma/Neuro ICU Case Manager [phone] 

## 2019-10-10 NOTE — Progress Notes (Signed)
Interval progress note   Spoke with daughter, [name] at bedside.  Wanted to update her on patient's condition.  Although she is stable enough to be transferred out of ICU today, she is less responsive today.  She is scheduled for iHD today.  I relayed to daughter that patient is very frail and with her many comorbid conditions, she could easily decompensate at any time, especially during dialysis given her biventricular HF and AS, and overall, her time is limited.    Dr. Haroldine Laws joined our conversation at the bedside, who reiterated the same.  Greatly appreciate HF assistance.    We discussed continuing current medical therapies with the understanding she could acutely decompensate including death or the possibility of changing to comfort focused care.  [name] says she and her family know these things but need time to make preparations in the possibility of taking her home, but overall not ready to make any changes.  She did agree to speak with case management to see if we could help the family in any way to facilitate taking patient home on hospice.  CSW consult placed.       Kennieth Rad, MSN, AGACNP-BC [facility] 10/10/2019, 1:52 PM

## 2019-10-10 NOTE — Progress Notes (Addendum)
Advanced Heart Failure Rounding Note   Subjective:   Maintaining SR/SB. On amio drip at 30 mg per hour.   Off neo.   Will not open eyes or respond to questions  Objective:   Weight Range:  Vital Signs:   Temp:  [94.9 F (34.9 C)-98.1 F (36.7 C)] 98.1 F (36.7 C) (12/10 0400) Pulse Rate:  [50-65] 62 (12/10 0800) Resp:  [11-22] 17 (12/10 0800) BP: (52-164)/(15-97) 138/55 (12/10 0800) SpO2:  [85 %-97 %] 97 % (12/10 0800) Weight:  [65.5 kg] 65.5 kg (12/10 0500) Last BM Date: 10/09/19  Weight change: Filed Weights   10/08/19 0450 10/09/19 0500 10/10/19 0500  Weight: 58.5 kg 64.5 kg 65.5 kg    Intake/Output:   Intake/Output Summary (Last 24 hours) at 10/10/2019 0847 Last data filed at 10/10/2019 0800 Gross per 24 hour  Intake 1564.19 ml  Output -  Net 1564.19 ml     Physical Exam: General: Frail. Elderly No resp difficulty will not respond HEENT: normal Neck: supple. JVP to jaw  Carotids 2+ bilat; no bruits. No lymphadenopathy or thryomegaly appreciated. Cor: PMI nondisplaced. Regular rate & rhythm. 3/6 AS. +S3  Lungs: clear Abdomen: soft, nontender, nondistended. No hepatosplenomegaly. No bruits or masses. Good bowel sounds. Extremities: no cyanosis, clubbing, rash, edema cachetic LUE AVF Neuro: lethargic. Will not respond to commands   EKG: SR/SB 50-60s     Labs: Basic Metabolic Panel: Recent Labs  Lab 10/05/19 1238 10/06/19 0316 10/07/19 0334 10/08/19 0248 10/09/19 0510 10/09/19 1005 10/10/19 0416  NA 137 134* 134* 135 137 135 137  K 4.6 5.0 4.8 4.0 5.0 4.8 4.9  CL 98 97* 96* 98 102 99 101  CO2 21* 20* 21* 24 13* 14* 19*  GLUCOSE 87 141* 135* 108* 140* 111* 154*  BUN 16 32* 51* 43* 51* 51* 57*  CREATININE 2.83* 3.50* 4.15* 3.85* 4.57* 4.70* 5.01*  CALCIUM 8.5* 8.2* 7.7* 7.5* 7.6* 7.6* 7.4*  MG 1.8  --  2.0 1.9 2.5*  --  2.3  PHOS 4.5 5.4* 5.5* 4.4 5.8*  --  5.8*    Liver Function Tests: Recent Labs  Lab 10/05/19 1238 10/07/19 0334  10/08/19 0248 10/09/19 0510 10/09/19 1005 10/10/19 0416  AST 80* 193*  --  332* 402* 711*  ALT 38 92*  --  157* 185* 308*  ALKPHOS 152* 150*  --  193* 175* 163*  BILITOT 1.5* 1.1  --  1.8* 1.7* 1.7*  PROT 7.0 6.9  --  6.0* 5.7* 5.5*  ALBUMIN 2.8* 2.5*  2.4* 2.1* 2.2* 2.2* 2.1*   Recent Labs  Lab 10/06/19 1013  LIPASE 49   No results for input(s): AMMONIA in the last 168 hours.  CBC: Recent Labs  Lab 10/03/19 1600 10/07/19 0334 10/08/19 0248 10/09/19 0510 10/09/19 1005 10/10/19 0416  WBC 2.6* 7.6 6.3 12.0* 13.1* 9.4  NEUTROABS 1.9  --   --   --   --   --   HGB 9.1* 10.5* 10.7* 11.4* 10.9* 10.2*  HCT 29.0* 32.1* 31.8* 35.3* 34.8* 31.9*  MCV 105.1* 99.4 99.7 103.5* 106.4* 102.6*  PLT 62* 81* 73* 47* 46* 43*    Cardiac Enzymes: Recent Labs  Lab 10/06/19 1013  CKTOTAL 153  CKMB 9.4*    BNP: BNP (last 3 results) No results for input(s): BNP in the last 8760 hours.  ProBNP (last 3 results) No results for input(s): PROBNP in the last 8760 hours.    Other results:  Imaging: No results  found.   Medications:     Scheduled Medications: . Chlorhexidine Gluconate Cloth  6 each Topical Q0600  . cinacalcet  30 mg Oral Q supper  . darbepoetin (ARANESP) injection - DIALYSIS  100 mcg Intravenous Q Fri-HD  . doxercalciferol  1 mcg Intravenous Q M,W,F-HD  . mouth rinse  15 mL Mouth Rinse BID  . midodrine  10 mg Oral TID WC  . multivitamin  1 tablet Oral QHS    Infusions: . sodium chloride 10 mL/hr at 10/10/19 0800  . amiodarone 30 mg/hr (10/10/19 0800)  . phenylephrine (NEO-SYNEPHRINE) Adult infusion Stopped (10/10/19 [id number])  . sodium chloride      PRN Medications: sodium chloride, acetaminophen **OR** acetaminophen, calcium carbonate (dosed in mg elemental calcium), camphor-menthol **AND** hydrOXYzine, dextrose, docusate sodium, feeding supplement (NEPRO CARB STEADY), ipratropium-albuterol, ondansetron **OR** ondansetron (ZOFRAN) IV, polyethylene glycol,  senna-docusate, sorbitol   Assessment/Plan:   1. Acute systolic HF with cardiogenic shock - Echo 12/6 with severe biventricular dysfunction with LVEF 15-20% severe RV dysfunction with RVSP ~75. Severe Tr, moderate MR and severe low gradient AS with VR = 0.30 - echo in 1/19 with EF 55-60%  Likely combination of sepsis and profound cardiogenic shock - etiology of cardiomyopathy is unclear. ? Septic, ischemic or AS. Ideally would have cath but overall condition seems quite tenuous and Palliative/Hospice may be more appropriate -Back in NSR. Off Neo . Continue midodrine 10 mg three times a day.   2. UTI/sepsis - being treated for UTI. Bcx 1/1 on 12/3 NGTD - COVID negative - PCT 1.88-> 2.15 - on vanc/cefepime per CCM  3. Severe AS - echo c/w low-gradient severe AS with dimensionless index 0.30 - not TAVR candidate currently due to dementia  4. ESRD - managed with HD.  Renal following  5. Dementia - baseline unclear.  6. A flutter - Converted to NSR on IV amio. Stop IV amio. Start amio 200 mg twice a day.   7. DNR/DNI Palliative following.   She is at the end of her life and was clear to me yesterday that she was suffering and just wanted to go home. Her daughter states that she wants her father to see her before she dies but they are not ready to take her home yet because they are not sure where in the house to put her.   I informed her that our Hospice team can help with these issues and I was getting uncomfortable continuing to perform procedures (I.e. dialysis) and use aggressive therapies in someone who has clearly stated they wanted to stop and go home. Ms. Duro unfortunately is too uremic to participate in today's conversation.   The HF team will sign off as we have nothing further to add or offer. Please call if we can help support.   CRITICAL CARE Performed by: Glori Bickers  Total critical care time: 35 minutes  Critical care time was exclusive of  separately billable procedures and treating other patients.  Critical care was necessary to treat or prevent imminent or life-threatening deterioration.  Critical care was time spent personally by me (independent of midlevel providers or residents) on the following activities: development of treatment plan with patient and/or surrogate as well as nursing, discussions with consultants, evaluation of patient's response to treatment, examination of patient, obtaining history from patient or surrogate, ordering and performing treatments and interventions, ordering and review of laboratory studies, ordering and review of radiographic studies, pulse oximetry and re-evaluation of patient's condition.  Length of Stay: 7  Glori Bickers, MD  2:15 PM   Advanced Heart Failure Team Pager [phone] (M-F; 7a - 4p)  Please contact [facility] for night-coverage after hours (4p -7a ) and weekends on [email or web address]

## 2019-10-10 NOTE — Progress Notes (Signed)
Notified by Merrily Pew, RN that second sister arrived and family has decided they wish to take her home on hospice.  They do not want iHD today but wish to continue other current medications including amio gtt.     I have placed urgent CSW consult to get home hospice set up.  Amio will be left for comfort to avoid tachy arrythmias while inpatient, and prn morphine/ ativan added.  Spoke with PMT to update them on plan of care.      Kennieth Rad, MSN, AGACNP-BC [facility] 10/10/2019, 2:43 PM

## 2019-10-10 NOTE — Progress Notes (Signed)
  Speech Language Pathology Treatment: Dysphagia  Patient Details Name: [name] [name] MRN: [id number] DOB: 1941-10-10 Today's Date: 10/10/2019 Time: 0951-1007 SLP Time Calculation (min) (ACUTE ONLY): 16 min  Assessment / Plan / Recommendation Clinical Impression  Pt has transferred to ICU since initial evaluation and is quite lethargic this morning. RN reports good but slow intake on previous date. Max cues were given for arousal. At one point pt began calling out but still did not open her eyes. She orally accepted a bite of puree and initiated lingual manipulation, but did not maintain arousal/attention long enough to swallow. This was suctioned back out of her mouth. Discussed with RN that although I will leave the current diet order in place, plan will be to hold POs pending increased alertness. Will continue to follow closely, noting that [employer] decisions are being made as well.   HPI HPI: 78 y.o. with PMH of Dementia, GERD, memory deficit, esophageal dysphagia (see below), ESRD on MWF dialysis, Type 1 DM, HL, HTN who presented on 12/3 with confusion and weakness. Per chart family noted decreased oral intake, diaphoresis and chills. CT head was negative for acute findings. Found to have multifactorial shock due to nephrogenic, vasoplegia, and ? sepsis from UTI vs aspiration vs HCAP. Barium esophagram 07/04/19 revealed mild-to-moderate esophageal dysmotility with a presbyesophagus pattern, small to moderate pulsion diverticulum in the lower left thoracic esophagus just above the esophagogastric junction. Palliative on board. CXR 12/5 no significant change in moderate RIGHT pleural effusion with cardiomegaly.      SLP Plan  Continue with current plan of care       Recommendations  Diet recommendations: Dysphagia 2 (fine chop);Thin liquid(when fully alert) Liquids provided via: Cup;Straw Medication Administration: Crushed with puree Supervision: Staff to assist with self feeding;Full  supervision/cueing for compensatory strategies Compensations: Slow rate;Small sips/bites;Lingual sweep for clearance of pocketing Postural Changes and/or Swallow Maneuvers: Seated upright 90 degrees                Oral Care Recommendations: Oral care BID Follow up Recommendations: (tba) SLP Visit Diagnosis: Dysphagia, oral phase (R13.11) Plan: Continue with current plan of care       [place] Aidah Forquer 10/10/2019, 10:52 AM  Pollyann Glen, M.A. [place] [facility] Environmental education officer [phone] Office [phone]

## 2019-10-10 NOTE — Progress Notes (Signed)
NAME:  [name] [name] , MRN:  [id number], DOB:  03-Aug-1941, LOS: 7 ADMISSION DATE:  10/03/2019, CONSULTATION DATE:  10/10/19  CHIEF COMPLAINT:  hypotension  BRIEF  78 y.o. F with PMH of Dementia, ESRD on MWF dialysis, Type 1 DM, HL, HTN who presented on 12/3 with confusion and weakness.  Family noted decreased oral intake and diaphoresis and chills.  She does make urine and had WBC's and bacteria, so was started on Ceftriaxone.  CT head was negative for acute findings.   Starting 12/4 pt's BP started down-trending, lactic acid 2.4.  She was started on NS 75cc/hr and overnight BP as low as 56/42.  She was given a total of 2L IVF boluses and transferred to the ICU.   Past Medical History   has a past medical history of DEPRESSION, DIABETES MELLITUS, TYPE I, ESRD (end stage renal disease) on dialysis ([place]), GERD, GLAUCOMA, Headache(784.0), Hepatitis B carrier ([place]), HYPERLIPIDEMIA, HYPERTENSION, Memory deficit, OSTEOPENIA, POSTMENOPAUSAL STATUS, Pulmonary edema, and Unspecified vitamin D deficiency.  Significant Hospital Events   12/3 Admit to Hospitalists 12/5 - Pt denying lung ultrasound.  Repeat rounds. Not on vent. Awake and alert. And ORiented. Diastolic low - SBP > 123XX123 with MAP > 50/55. Refuses IV, CVL, blood draws.Says goal is to go home well and asap. Says blood test last week is sufficient. Seems to have capacity. On 41mcg levophd via one PIV.  Per RN s./p 1L HD volume removal yesterday 12/7-transferred to TRH/floor 12/8 PCCM reconsulted d/t tachycardia, confusion, hypotension, tx to ICU overnight on low dose pressors, ongoing SVT- made DNR/ DNI, hypoglycemic episodes, re-occurrence of SVT improved with amio and mag bolus 12/9 currently in SB, ongoing low BP on Neo at 50 mcg/min- no augmentation of care, awake, confused, no distress on room air despite BP reading on leg of SBP 50 (not felt to be true measurement of BP given good mental status), remains afebrile    Consults:   PCCM Nephrology HF  Procedures:   Significant Diagnostic Tests:  12/6 Echo >> severe biventricular dysfunction with LVEF 15-20% severe RV dysfunction with RVSP ~75. Severe Tr, moderate MR and severe low gradient AS with VR = 0.30  Micro Data:  UC>>never sent 12/3 BCx2>>NG x 5 days  12/4 Sars-CoV-2>>negative MRSA surveillance negative   Antimicrobials:  Ceftriaxone 12/3 >> 12/5 Cefepime 12/5 >>12/9 Vancomycin 12/5 x 1 dose   Interim history/subjective:  Off Neo No changes  Objective   Blood pressure (!) 138/55, pulse 62, temperature (!) 97.1 F (36.2 C), temperature source Axillary, resp. rate 17, height 5' 5.5" (1.664 m), weight 65.5 kg, SpO2 97 %.        Intake/Output Summary (Last 24 hours) at 10/10/2019 [id number] Last data filed at 10/10/2019 0800 Gross per 24 hour  Intake 1462.54 ml  Output -  Net 1462.54 ml   Filed Weights   10/08/19 0450 10/09/19 0500 10/10/19 0500  Weight: 58.5 kg 64.5 kg 65.5 kg   General:  Cachectic/ elderly female sitting in bed in NAD HEENT: MM pink/moist, pupils 3/reactive Neuro: Will awake to verbal, not following as many commands for me today CV: RR, +murmur PULM:  Non labored, clear anteriorly, bibasilar crackles GI: soft, bs active  Extremities: warm/dry, no LE edema, LUE AVF +B/T Skin: no rashes   Resolved Hospital Problem list     Assessment & Plan:  # Hypotension 2/2 ST/Aflutter in setting of BiV failure with cardiogenic shock and critical AoS non-amenable to TAVR  # ESRD on HD #  UTI/ sepsis # shock- mixed possibly of sepsis with AoC biventricular failure # Dementia- appears advanced # Severe protein calorie malnutrition present on admission # Frail elderly- appears to be approaching end of life # Starvation ketosis # DM2/ hypoglycemia   - DNR/ DNI - off pressors with good MAP today, likely will go for iHD - continue midodrine TID - will tx back SDU and TRH as of 12/11 - ongoing tele monitoring and QTc monitoring -  ongoing CBG's given poor PO intake/ nutrition - appreciate input from HF team.   - HF stopping amio, converting to PO - monitor clinically post 7 day completed abx for UTI - encourage nutrition when awake - Appreciate Palliative care assistance.  Transition to comfort focused care would be appropiate at any time given her multiple comorbidities, dementia, frail, and malnourished condition. Would avoid future vasopressors but will need to discuss with family on limitations of care.  Is current DNR/ DNI  Attempted to call daughter, [name] [phone], for update with no answer    Kennieth Rad, MSN, AGACNP-BC [facility] 10/10/2019, 9:53 AM

## 2019-10-10 NOTE — TOC Initial Note (Signed)
Transition of Care [facility]) - Initial/Assessment Note    Patient Details  Name: [name] [name] MRN: [id number] Date of Birth: [birth year]  Transition of Care [facility]) CM/SW Contact:    Ella Bodo, RN Phone Number: 10/10/2019, 4:24 PM  Clinical Narrative:  Pt admitted on 10/03/19 with acute systolic CHF with cardiogenic shock, UTI/sepsis, severe AS, ESRD, and Aflutter.  Pt with hx of dementia; lives at home with spouse--daughter has been staying with pt lately as well.  Family wishes to take pt home with Hospice services; referral has been made to [facility], per their wishes.  Once DME has been delivered to the home will arrange transport to home via non-emergent ambulance services.  Hopeful for dc on 10/11/19, pending delivery of DME.                    Expected Discharge Plan: Home w Hospice Care Barriers to Discharge: Continued Medical Work up   Patient Goals and CMS Choice   CMS Medicare.gov Compare Post Acute Care list provided to:: Patient Represenative (must comment)(Daughter) Choice offered to / list presented to : Adult Children  Expected Discharge Plan and Services Expected Discharge Plan: Home w Hospice Care   Discharge Planning Services: CM Consult Post Acute Care Choice: Hospice Living arrangements for the past 2 months: Single Family Home                           HH Arranged: RN, Social Work, Nurse's Aide [place] Agency: Hospice and [place] Date [place]: 10/10/19 Time [place]: 1450 Representative spoke with at [place]: [place] Arrangements/Services Living arrangements for the past 2 months: [place] Lives with:: Spouse, Adult Children Patient language and need for interpreter reviewed:: Yes Do you feel safe going back to the place where you live?: Yes      Need for Family Participation in Patient Care: Yes (Comment) Care giver support system in place?: Yes (comment) Current home  services: DME(RW, BSC) Criminal Activity/Legal Involvement Pertinent to Current Situation/Hospitalization: No - Comment as needed  Activities of Daily Living      Permission Sought/Granted Permission sought to share information with : Family Supports Permission granted to share information with : Yes, Verbal Permission Granted     Permission granted to share info w AGENCY: Manufacturing engineer        Emotional Assessment Appearance:: Appears stated age Attitude/Demeanor/Rapport: Unable to Assess Affect (typically observed): Unable to Assess   Alcohol / Substance Use: Not Applicable Psych Involvement: No (comment)  Admission diagnosis:  End stage renal disease on dialysis ([place]) [N18.6, Z99.2] Urinary tract infection without hematuria, site unspecified [N39.0] Sepsis with acute organ dysfunction, due to unspecified organism, unspecified type, unspecified whether septic shock present ([place]) [A41.9, R65.20] Patient Active Problem List   Diagnosis Date Noted  . Severe aortic stenosis 10/08/2019  . Severe protein-calorie malnutrition ([place]) 10/08/2019  . Acute on chronic systolic congestive heart failure ([place])   . End stage renal disease on dialysis ([place])   . Cardiogenic shock ([place])   . Palliative care by specialist   . DNR (do not resuscitate) discussion   . Adult failure to thrive   . AMS (altered mental status) 10/03/2019  . Severe sepsis with septic shock ([place]) 10/03/2019  . Acute lower UTI 10/03/2019  . Chronic pain of left knee 01/17/2019  . ESRD (end stage renal disease) ([place]) 01/08/2019  .  AV graft malfunction, initial encounter ([place]) 12/14/2018  . Acute respiratory failure with hypoxia ([place]) 12/13/2018  . AV graft malfunction (HCC) 09/03/2018  . Urinary tract infection without hematuria 08/21/2018  . Memory loss 08/21/2018  . Anemia, chronic disease 03/01/2018  . Vasovagal syncope 03/01/2018  . Macrocytic anemia 01/28/2018  . Orthostatic syncope 01/28/2018  . EKG,  abnormal   . Acute metabolic encephalopathy 123456  . Influenza 11/16/2017  . Fever in adult 11/15/2017  . Demand ischemia ([place]) 11/10/2017  . Normocytic anemia 11/10/2017  . Aortic atherosclerosis ([place]) 11/10/2017  . Respiratory failure with hypoxia ([place]) 08/18/2017  . Volume overload 08/18/2017  . Dialysis patient, noncompliant ([place]) 08/17/2017  . Thumb pain, left 12/22/2016  . Chronic diastolic heart failure ([place]) 11/25/2016  . ESRD (end stage renal disease) on dialysis ([place]) 06/25/2014  . Malignant hypertension 05/17/2014  . Chest pain 05/17/2014  . Elevated troponin 05/17/2014  . Hypertensive emergency 05/17/2014  . NSTEMI (non-ST elevated myocardial infarction) ([place]) 05/17/2014  . GERD 06/17/2009  . Hepatitis B carrier ([place]) 06/17/2009  . DEPRESSION 05/20/2009  . Unspecified glaucoma 05/20/2009  . HEADACHE 05/20/2009  . CARDIAC MURMUR 05/20/2009  . OSTEOPENIA 02/15/2008  . POSTMENOPAUSAL STATUS 12/21/2007  . Hyperlipidemia LDL goal <70 09/21/2007  . Essential hypertension 09/13/2007  . Type II diabetes mellitus with nephropathy ([place])    PCP:  Ann Held, DO Pharmacy:   [id number] - [place], [place] [id number] [place] [id number] Phone: [phone] Fax: [phone]  [facility] [place], [place] [address] Phone: [phone] Fax: [phone]     Social Determinants of Health (SDOH) Interventions    Readmission Risk Interventions No flowsheet data found.  Reinaldo Raddle, RN, BSN  Trauma/Neuro ICU Case Manager [phone]

## 2019-10-11 DIAGNOSIS — R627 Adult failure to thrive: Secondary | ICD-10-CM

## 2019-10-11 LAB — GLUCOSE, CAPILLARY
Glucose-Capillary: 99 mg/dL (ref 70–99)
Glucose-Capillary: 94 mg/dL (ref 70–99)
Glucose-Capillary: 113 mg/dL — ABNORMAL HIGH (ref 70–99)
Glucose-Capillary: 90 mg/dL (ref 70–99)

## 2019-10-11 MED ORDER — DARBEPOETIN ALFA 100 MCG/0.5ML IJ SOSY: 100.0000 ug | PREFILLED_SYRINGE | INTRAMUSCULAR | Status: DC

## 2019-10-11 MED ORDER — OXYCODONE HCL 20 MG/ML PO CONC: 10.0000 mg | ORAL | 0 refills | Status: AC

## 2019-10-11 MED ORDER — OXYCODONE HCL 5 MG/5ML PO SOLN: 10.0000 mg | ORAL | Status: DC | PRN

## 2019-10-11 MED ORDER — CAMPHOR-MENTHOL 0.5-0.5 % EX LOTN: 1.0000 "application " | TOPICAL_LOTION | Freq: Three times a day (TID) | CUTANEOUS | 0 refills | Status: AC | PRN

## 2019-10-11 MED ORDER — LORAZEPAM 2 MG/ML PO CONC: 1.0000 mg | ORAL | 0 refills | Status: AC | PRN

## 2019-10-11 MED ORDER — IPRATROPIUM-ALBUTEROL 0.5-2.5 (3) MG/3ML IN SOLN: 3.0000 mL | RESPIRATORY_TRACT | 2 refills | Status: AC | PRN

## 2019-10-11 MED ORDER — NEPRO/CARBSTEADY PO LIQD: 237.0000 mL | Freq: Three times a day (TID) | ORAL | 12 refills | Status: AC | PRN

## 2019-10-11 MED ORDER — HYDROXYZINE HCL 25 MG PO TABS: 25.0000 mg | ORAL_TABLET | Freq: Three times a day (TID) | ORAL | 0 refills | Status: AC | PRN

## 2019-10-11 MED ORDER — DOCUSATE SODIUM 283 MG RE ENEM: 1.0000 | ENEMA | RECTAL | 0 refills | Status: AC | PRN

## 2019-10-11 MED ORDER — LORAZEPAM 2 MG/ML PO CONC: 1.0000 mg | ORAL | Status: DC | PRN

## 2019-10-11 NOTE — Discharge Summary (Signed)
Physician Discharge Summary         Patient ID: [name] [name] MRN: [id number] DOB/AGE: 1941-07-19 78 y.o.  Admit date: 10/03/2019 Discharge date: 10/11/2019  Discharge Diagnoses:   # Hypotension 2/2 ST/Aflutter in setting of BiV failure with cardiogenic shock and critical AoS non-amenable to TAVR  # ESRD on HD # UTI/ sepsis # shock- mixed possibly of sepsis with AoC biventricular failure # Dementia- appears advanced # Severe protein calorie malnutrition present on admission # Frail elderly- appears to be approaching end of life # Starvation ketosis # DM2/ hypoglycemia  # Biventricular failure # Critical aortic stenosis not amenable to TAVR # Dementia-advanced # DNI/ DNR: Comfort Care/ Palliation   # Home with Hospice 12/11  Discharge summary    78 y.o. F with PMH of Dementia, ESRD on MWF dialysis, Type 1 DM, HL, HTN who presented on 12/3 with confusion and weakness.  Family noted decreased oral intake and diaphoresis and chills.  She does make urine and had WBC's and bacteria, so was started on Ceftriaxone.  CT head was negative for acute findings.   Starting 12/4 pt's BP started down-trending, lactic acid 2.4.  She was started on NS 75cc/hr and overnight BP as low as 56/42.  She was given a total of 2L IVF boluses and transferred to the ICU.   Significant Hospital Events 12/3 Admit to Hospitalists 12/5 - Pt denying lung ultrasound.  Repeat rounds. Not on vent. Awake and alert. And ORiented. Diastolic low - SBP > 123XX123 with MAP > 50/55. Refuses IV, CVL, blood draws.Says goal is to go home well and asap. Says blood test last week is sufficient. Seems to have capacity. On 1mcg levophd via one PIV.  Per RN s./p 1L HD volume removal yesterday 12/7-transferred to TRH/floor 12/8 PCCM reconsulted d/t tachycardia, confusion, hypotension, tx to ICU overnight on low dose pressors, ongoing SVT- made DNR/ DNI, hypoglycemic episodes, re-occurrence of SVT improved with amio and mag bolus 12/9  currently in SB, ongoing low BP on Neo at 50 mcg/min- no augmentation of care, awake, confused, no distress on room air despite BP reading on leg of SBP 50 (not felt to be true measurement of BP given good mental status), remains afebrile  12/10:   She has had continued decline in the ICU and has become very frail  [name] Rad, NP spoke with patient's daughter [name] [name] explained that  with her mother's many comorbid conditions, she could easily decompensate at any time, especially during dialysis given her biventricular HF and AS, and overall, her time is limited.  Dr. Haroldine [name] also spoke with [name] [name] and reiterated the same. The team  discussed continuing current medical therapies with the understanding she could acutely decompensate including death or the possibility of changing to comfort focused care.  After discussion with CSW [name] [name] and her family made the decision to make [name] [name] a DNR and transfer patient home with hospice to allow the patient's husband to see her . CSW has made arrangements for hospital bed and other equipment to be delivered to the patient's home today. We have had confirmation that the equipment is there, and we will now discharge the patient home to Hospice with family. Orders have been placed to discharge home and prescriptions have been sent to the pharmacy for ativan solution and oxycodone solution . Appreciate the assistance of Hospice in the process.   Discharge Plan by Active Problems    # Hypotension 2/2 ST/Aflutter in setting of BiV failure with cardiogenic shock  and critical AoS non-amenable to TAVR  # ESRD on HD # UTI/ sepsis # shock- mixed possibly of sepsis with AoC biventricular failure # Dementia- appears advanced # Severe protein calorie malnutrition present on admission # Frail elderly- appears to be approaching end of life # Starvation ketosis # DM2/ hypoglycemia   - DNR/ DNI>> Home with Hospice  - off pressors with good MAP today  - No further HD per  family, home with Hospice care/ Comfort care  - HF stopping amio once patient leaves the hospital - encourage nutrition when awake - Full DNR/DNI Spoke with daughter  [name] [name] [phone], she is aware of plan to get patient home today.DME supplies and equipment have been delivered to patient's home She is aware and in agreement with stopping amiodarone upon discharge from Hospital Prescriptions sent for Roxicodone 20 mg/ml 10 mg ( 0.5 ml) po Q 30 minutes  Ativan Intensol 2 mg / ml, Take 1 mg ( 0.5 ml) Q2 hours prn anxiety  Pt has reached maximal inpatient benefit and family have chosen to take patient home with Hospice assistance to allow her husband to see her.   Significant Hospital tests/ studies  12/6 Echo >> severe biventricular dysfunction with LVEF 15-20% severe RV dysfunction with RVSP ~75. Severe Tr, moderate MR and severe low gradient AS with VR = 0.30 Procedures   IHD Culture data/antimicrobials   UC>>never sent 12/3 BCx2>>NG x 5 days  12/4 Sars-CoV-2>>negative MRSA surveillance negative   Antimicrobials Ceftriaxone 12/3 >> 12/5 Cefepime 12/5 >>12/9 Vancomycin 12/5 x 1 dose   Consults  Renal Cardiology    Discharge Exam: BP (!) 120/57   Pulse 61   Temp 98.3 F (36.8 C) (Oral)   Resp 13   Ht 5' 5.5" (1.664 m)   Wt 61.7 kg   SpO2 97%   BMI 22.29 kg/m    Labs at discharge   Lab Results  Component Value Date   CREATININE 5.01 (H) 10/10/2019   BUN 57 (H) 10/10/2019   NA 137 10/10/2019   K 4.9 10/10/2019   CL 101 10/10/2019   CO2 19 (L) 10/10/2019   Lab Results  Component Value Date   WBC 9.4 10/10/2019   HGB 10.2 (L) 10/10/2019   HCT 31.9 (L) 10/10/2019   MCV 102.6 (H) 10/10/2019   PLT 43 (L) 10/10/2019   Lab Results  Component Value Date   ALT 308 (H) 10/10/2019   AST 711 (H) 10/10/2019   ALKPHOS 163 (H) 10/10/2019   BILITOT 1.7 (H) 10/10/2019   Lab Results  Component Value Date   INR 1.3 (H) 10/03/2019   INR 1.19 12/14/2018   INR 1.13  11/23/2018    Current radiological studies    No results found.  Disposition:  Discharge Home with Hospice  Discharge disposition: 01-Home or Self Care         Allergies as of 10/11/2019      Reactions   Hydralazine Itching   Robaxin [methocarbamol] Other (See Comments)   Dizziness       Medication List    STOP taking these medications   carvedilol 25 MG tablet Commonly known as: COREG   cinacalcet 30 MG tablet Commonly known as: SENSIPAR   folic acid-vitamin b complex-vitamin c-selenium-zinc 3 MG Tabs tablet   furosemide 40 MG tablet Commonly known as: LASIX   isosorbide mononitrate 30 MG 24 hr tablet Commonly known as: IMDUR   nitroGLYCERIN 0.4 MG SL tablet Commonly known as: NITROSTAT   sevelamer carbonate  800 MG tablet Commonly known as: RENVELA     TAKE these medications   acetaminophen 325 MG tablet Commonly known as: TYLENOL Take 650 mg by mouth every 6 (six) hours as needed for mild pain.   camphor-menthol lotion Commonly known as: SARNA Apply 1 application topically every 8 (eight) hours as needed for itching.   docusate sodium 283 MG enema Commonly known as: ENEMEEZ Place 1 enema (283 mg total) rectally as needed for severe constipation.   feeding supplement (NEPRO CARB STEADY) Liqd Take 237 mLs by mouth 3 (three) times daily as needed (Supplement).   hydrOXYzine 25 MG tablet Commonly known as: ATARAX/VISTARIL Take 1 tablet (25 mg total) by mouth every 8 (eight) hours as needed for itching.   ipratropium-albuterol 0.5-2.5 (3) MG/3ML Soln Commonly known as: DUONEB Take 3 mLs by nebulization every 4 (four) hours as needed for up to 20 doses.   LORazepam 2 MG/ML concentrated solution Commonly known as: LORazepam Intensol Take 0.5 mLs (1 mg total) by mouth every 2 (two) hours as needed for up to 60 doses for anxiety.   oxyCODONE 20 MG/ML concentrated solution Commonly known as: ROXICODONE INTENSOL Take 0.5 mLs (10 mg total) by mouth  every 30 (thirty) minutes.        Follow-up appointment   Hospice at Home Discharge Condition:   Poor/ Frail with end of life expected   Physician Statement:   The Patient was personally examined, the discharge assessment and plan has been personally reviewed and I agree with ACNP Malli Falotico's  assessment and plan. 60  minutes of time have been dedicated to discharge assessment, planning and discharge instructions.   Signed: Magdalen Spatz 10/11/2019, 3:50 PM

## 2019-10-11 NOTE — TOC Transition Note (Signed)
Transition of Care [facility]) - CM/SW Discharge Note   Patient Details  Name: SHALAINE FRYMOYER MRN: [id number] Date of Birth: 11/04/1940  Transition [facility] Care [facility]) CM/SW Contact:  Ella Bodo, RN Phone Number: 10/11/2019, 3:38 PM   Clinical Narrative:   DME delivered to home around 3pm, per [name], patient's daughter.  Spoke with Venia Carbon with [facility]; agency plans to visit pt once she arrives at home.  MD has completed gold out of facility DNR form for transport.  PTAR notified for transport at 1538; notified daughter, [name] of time ambulance called.  Will ask bedside nurse to call [name] when [facility] picks pt up.     Final next level of care: Home w Hospice Care Barriers to Discharge: Barriers Resolved   Patient Goals and CMS Choice   CMS Medicare.gov Compare Post Acute Care list provided to:: Patient Represenative (must comment)(Daughter) Choice offered to / list presented to : Adult Children  Discharge Placement                       Discharge Plan and Services   Discharge Planning Services: CM Consult Post Acute Care Choice: Hospice                    HH Arranged: RN, Social Work, Nurse's Aide [place] Agency: Hospice and [place] Date [place]: 10/10/19 Time [place]: 1450 Representative spoke with at [place]: [place] ([place]) Interventions     Readmission Risk Interventions Readmission Risk Prevention Plan 10/11/2019  Transportation Screening Complete  PCP or Specialist Appt within 3-5 Days Not Complete  Not Complete comments Hospice pt; EOL  HRI or [place] Complete  Social Work Consult for [place] Planning/Counseling Complete  Palliative Care Screening Complete  Medication Review Press photographer) Complete  Some recent data might be hidden    Reinaldo Raddle, RN, BSN  Trauma/Neuro ICU Case Manager [phone]

## 2019-10-11 NOTE — Progress Notes (Signed)
Palliative Medicine RN Note: Rec'd request for assistance with recs for d/c morphine on [name] [name]. Due to her ESRD, morphine is contraindicated. I spoke with Dr Hilma Favors, who initiated po Roxicodone and Ativan Intensol.  Typically, hospice prescriptions for these medications are:  1. Roxicodone 20mg /ml. Take 10 mg (0.5 ml) PO every 30 minutes prn pain or trouble breathing. Disp 30 ml. 2. Ativan Intensol 2mg /ml. Take 1 mg (0.5 ml) PO every 2 hours prn anxiety. Disp 30 ml.  Please call our office with any other questions or concerns.   Marjie Skiff Deane Wattenbarger, RN, BSN, [facility] Palliative Medicine Team 10/11/2019 11:02 AM Office [phone]

## 2019-10-11 NOTE — TOC Progression Note (Signed)
Transition of Care [facility]) - Progression Note    Patient Details  Name: [name] [name] MRN: [id number] Date of Birth: Feb 10, 1941  Transition of Care [facility]) CM/SW Contact  Oren Section Cleta Alberts, RN Phone Number: 10/11/2019, 1:36 PM  Clinical Narrative:  Awaiting delivery of DME from [place] this afternoon.  Spoke with daughter [name] [name], she will call pt's nurse Caryl Pina, when equipment has arranged.  [name] [name] states family is at home and ready to receive patient; will arrange transport via PTAR to home when DME in the home and pt is discharged.       Expected Discharge Plan: Home w Hospice Care Barriers to Discharge: Continued Medical Work up  Expected Discharge Plan and Services Expected Discharge Plan: [place]   Discharge Planning Services: CM Consult Post Acute Care Choice: Hospice Living arrangements for the past 2 months: Single Family Home                           HH Arranged: RN, Social Work, Nurse's Aide [place] Agency: Hospice and [place] Date [place]: 10/10/19 Time [place]: 1450 Representative spoke with at [place]: [place] ([place]) Interventions    Readmission Risk Interventions No flowsheet data found.  Reinaldo Raddle, RN, BSN  Trauma/Neuro ICU Case Manager [phone]

## 2019-10-11 NOTE — Progress Notes (Signed)
Manufacturing engineer  DME ordered from [place], STAT this morning at 0900.  Family will notify hospital once DME is set up so transportation can be arranged.  Thank you, Venia Carbon RN, BSN, [place] [facility] Liaison (in [place]) [phone]

## 2019-10-11 NOTE — Discharge Summary (Signed)
NAME:  [name] [name], MRN:  [id number], DOB:  Sep 18, 1941, LOS: 8 ADMISSION DATE:  10/03/2019, CONSULTATION DATE:  10/11/19  CHIEF COMPLAINT:  hypotension  BRIEF  78 y.o. F with PMH of Dementia, ESRD on MWF dialysis, Type 1 DM, HL, HTN who presented on 12/3 with confusion and weakness.  Family noted decreased oral intake and diaphoresis and chills.  She does make urine and had WBC's and bacteria, so was started on Ceftriaxone.  CT head was negative for acute findings.   Starting 12/4 pt's BP started down-trending, lactic acid 2.4.  She was started on NS 75cc/hr and overnight BP as low as 56/42.  She was given a total of 2L IVF boluses and transferred to the ICU.   Past Medical History   has a past medical history of DEPRESSION, DIABETES MELLITUS, TYPE I, ESRD (end stage renal disease) on dialysis ([place]), GERD, GLAUCOMA, Headache(784.0), Hepatitis B carrier ([place]), HYPERLIPIDEMIA, HYPERTENSION, Memory deficit, OSTEOPENIA, POSTMENOPAUSAL STATUS, Pulmonary edema, and Unspecified vitamin D deficiency.  Significant Hospital Events   12/3 Admit to Hospitalists 12/5 - Pt denying lung ultrasound.  Repeat rounds. Not on vent. Awake and alert. And ORiented. Diastolic low - SBP > 123XX123 with MAP > 50/55. Refuses IV, CVL, blood draws.Says goal is to go home well and asap. Says blood test last week is sufficient. Seems to have capacity. On 25mcg levophd via one PIV.  Per RN s./p 1L HD volume removal yesterday 12/7-transferred to TRH/floor 12/8 PCCM reconsulted d/t tachycardia, confusion, hypotension, tx to ICU overnight on low dose pressors, ongoing SVT- made DNR/ DNI, hypoglycemic episodes, re-occurrence of SVT improved with amio and mag bolus 12/9 currently in SB, ongoing low BP on Neo at 50 mcg/min- no augmentation of care, awake, confused, no distress on room air despite BP reading on leg of SBP 50 (not felt to be true measurement of BP given good mental status), remains afebrile  12/11 Plan is to get patient  home with Hospice Care and discharge today   Consults:  PCCM Nephrology HF  Procedures:   Significant Diagnostic Tests:  12/6 Echo >> severe biventricular dysfunction with LVEF 15-20% severe RV dysfunction with RVSP ~75. Severe Tr, moderate MR and severe low gradient AS with VR = 0.30  Micro Data:  UC>>never sent 12/3 BCx2>>NG x 5 days  12/4 Sars-CoV-2>>negative MRSA surveillance negative   Antimicrobials:  Ceftriaxone 12/3 >> 12/5 Cefepime 12/5 >>12/9 Vancomycin 12/5 x 1 dose   Interim history/subjective:  Off Neo Goal of discharge home today with Hospice care Pt. States she wants to go home  Objective   Blood pressure 122/63, pulse 64, temperature (!) 96.7 F (35.9 C), temperature source Rectal, resp. rate 14, height 5' 5.5" (1.664 m), weight 61.7 kg, SpO2 95 %.        Intake/Output Summary (Last 24 hours) at 10/11/2019 1007 Last data filed at 10/11/2019 0900 Gross per 24 hour  Intake 642.98 ml  Output --  Net 642.98 ml   Filed Weights   10/09/19 0500 10/10/19 0500 10/11/19 0500  Weight: 64.5 kg 65.5 kg 61.7 kg   Seen and examined 12/11 at 10:05  General:  Cachectic/ elderly female sitting in bed in NAD, lethargic HEENT: MM pink/dry , pupils 3 sluggish to react Neuro: Will awake to verbal, answers simple questions  CV: S1, S2, RR, +murmur PULM:  Bilateral chest excursion, Non labored, clear anteriorly, bibasilar crackles GI: soft, bs active  Extremities: warm/dry, no LE edema, LUE AVF +B/T Skin: no rashes  Resolved Hospital Problem list     Assessment & Plan:  # Hypotension 2/2 ST/Aflutter in setting of BiV failure with cardiogenic shock and critical AoS non-amenable to TAVR  # ESRD on HD # UTI/ sepsis # shock- mixed possibly of sepsis with AoC biventricular failure # Dementia- appears advanced # Severe protein calorie malnutrition present on admission # Frail elderly- appears to be approaching end of life # Starvation ketosis # DM2/  hypoglycemia   - DNR/ DNI - off pressors with good MAP today  - No further HD per family, home with Hospice care  - continue midodrine TID   - HF stopping amio once patient leaves the hospital - encourage nutrition when awake - Full DNR/DNI Full comfort care upon discharge to hospice Spoke with daughter  [name] [phone], she is aware of plan to get patient home today.She is awaiting delivery of DME supplies and Equipment. She is aware and in agreement with stopping amiodarone upon discharge from [place] APP Time 45 minutes  Magdalen Spatz, MSN, AGACNP-BC [place] Pager # [phone] After 4 pm please call [phone] 10/11/2019, 10:07 AM

## 2019-10-11 NOTE — Progress Notes (Signed)
Pt picked up from 4N19 by PTAR. Pt alert, responds to name and understands that she is going home to be with her family. All PIVs have been removed. All belongings have been returned to the pt including her clothing.

## 2019-10-15 ENCOUNTER — Other Ambulatory Visit: Payer: Self-pay | Admitting: Family Medicine

## 2019-10-15 NOTE — Telephone Encounter (Signed)
Please call pt to schedule routine follow up with Dr Carollee Herter soon. Thank you!

## 2019-10-15 NOTE — Telephone Encounter (Signed)
Dr Carollee Herter -- Please advise regarding Carvedilol request. Med was discontinued at discharge from hospital in September. Also, pt last seen by you in March and has no follow ups on file.

## 2019-10-16 NOTE — Telephone Encounter (Signed)
LM to schedule appt - carvedilol was d/c and needing to setup follow up to review medications with provider

## 2019-11-01 NOTE — Progress Notes (Signed)
This encounter was created in error - please disregard.

## 2019-11-01 DEATH — deceased

## 2020-02-10 ENCOUNTER — Encounter: Payer: Self-pay | Admitting: Family Medicine
